# Patient Record
Sex: Female | Born: 1965 | Race: Black or African American | Hispanic: No | Smoking: Never smoker
Health system: Southern US, Community
[De-identification: ages and names within clinical notes are randomized; demographics above are authoritative.]

## PROBLEM LIST (undated history)

## (undated) DIAGNOSIS — A879 Viral meningitis, unspecified: Secondary | ICD-10-CM

## (undated) DIAGNOSIS — R918 Other nonspecific abnormal finding of lung field: Secondary | ICD-10-CM

## (undated) DIAGNOSIS — J40 Bronchitis, not specified as acute or chronic: Secondary | ICD-10-CM

## (undated) DIAGNOSIS — L309 Dermatitis, unspecified: Secondary | ICD-10-CM

## (undated) DIAGNOSIS — G4733 Obstructive sleep apnea (adult) (pediatric): Secondary | ICD-10-CM

## (undated) DIAGNOSIS — K279 Peptic ulcer, site unspecified, unspecified as acute or chronic, without hemorrhage or perforation: Secondary | ICD-10-CM

## (undated) DIAGNOSIS — K8020 Calculus of gallbladder without cholecystitis without obstruction: Secondary | ICD-10-CM

## (undated) DIAGNOSIS — G8929 Other chronic pain: Secondary | ICD-10-CM

## (undated) DIAGNOSIS — F329 Major depressive disorder, single episode, unspecified: Secondary | ICD-10-CM

## (undated) DIAGNOSIS — E119 Type 2 diabetes mellitus without complications: Secondary | ICD-10-CM

## (undated) DIAGNOSIS — F419 Anxiety disorder, unspecified: Secondary | ICD-10-CM

## (undated) DIAGNOSIS — M6530 Trigger finger, unspecified finger: Secondary | ICD-10-CM

## (undated) DIAGNOSIS — M549 Dorsalgia, unspecified: Secondary | ICD-10-CM

## (undated) DIAGNOSIS — R0602 Shortness of breath: Secondary | ICD-10-CM

## (undated) DIAGNOSIS — R3915 Urgency of urination: Secondary | ICD-10-CM

## (undated) DIAGNOSIS — M2540 Effusion, unspecified joint: Secondary | ICD-10-CM

## (undated) DIAGNOSIS — M069 Rheumatoid arthritis, unspecified: Secondary | ICD-10-CM

## (undated) DIAGNOSIS — R42 Dizziness and giddiness: Secondary | ICD-10-CM

## (undated) DIAGNOSIS — M2550 Pain in unspecified joint: Secondary | ICD-10-CM

## (undated) DIAGNOSIS — R350 Frequency of micturition: Secondary | ICD-10-CM

## (undated) DIAGNOSIS — J189 Pneumonia, unspecified organism: Secondary | ICD-10-CM

## (undated) DIAGNOSIS — M1990 Unspecified osteoarthritis, unspecified site: Secondary | ICD-10-CM

## (undated) DIAGNOSIS — F32A Depression, unspecified: Secondary | ICD-10-CM

## (undated) DIAGNOSIS — R238 Other skin changes: Secondary | ICD-10-CM

## (undated) DIAGNOSIS — I10 Essential (primary) hypertension: Secondary | ICD-10-CM

## (undated) DIAGNOSIS — E662 Morbid (severe) obesity with alveolar hypoventilation: Secondary | ICD-10-CM

## (undated) DIAGNOSIS — N3289 Other specified disorders of bladder: Secondary | ICD-10-CM

## (undated) DIAGNOSIS — N739 Female pelvic inflammatory disease, unspecified: Secondary | ICD-10-CM

## (undated) DIAGNOSIS — G4700 Insomnia, unspecified: Secondary | ICD-10-CM

## (undated) DIAGNOSIS — R351 Nocturia: Secondary | ICD-10-CM

## (undated) DIAGNOSIS — G5600 Carpal tunnel syndrome, unspecified upper limb: Secondary | ICD-10-CM

## (undated) DIAGNOSIS — R51 Headache: Secondary | ICD-10-CM

## (undated) DIAGNOSIS — K219 Gastro-esophageal reflux disease without esophagitis: Secondary | ICD-10-CM

## (undated) DIAGNOSIS — J984 Other disorders of lung: Secondary | ICD-10-CM

## (undated) HISTORY — DX: Gastro-esophageal reflux disease without esophagitis: K21.9

## (undated) HISTORY — DX: Other nonspecific abnormal finding of lung field: R91.8

## (undated) HISTORY — DX: Obstructive sleep apnea (adult) (pediatric): G47.33

## (undated) HISTORY — DX: Type 2 diabetes mellitus without complications: E11.9

## (undated) HISTORY — PX: ESOPHAGOGASTRODUODENOSCOPY: SHX1529

## (undated) HISTORY — DX: Other specified disorders of bladder: N32.89

## (undated) HISTORY — DX: Female pelvic inflammatory disease, unspecified: N73.9

## (undated) HISTORY — PX: OTHER SURGICAL HISTORY: SHX169

## (undated) HISTORY — DX: Major depressive disorder, single episode, unspecified: F32.9

## (undated) HISTORY — DX: Viral meningitis, unspecified: A87.9

## (undated) HISTORY — DX: Depression, unspecified: F32.A

## (undated) HISTORY — DX: Morbid (severe) obesity with alveolar hypoventilation: E66.2

## (undated) HISTORY — DX: Calculus of gallbladder without cholecystitis without obstruction: K80.20

---
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
## 1993-08-25 HISTORY — PX: OTHER SURGICAL HISTORY: SHX169

## 1997-08-05 ENCOUNTER — Encounter: Admission: RE | Admit: 1997-08-05 | Discharge: 1997-08-05 | Payer: Self-pay | Admitting: Internal Medicine

## 1997-08-30 ENCOUNTER — Encounter: Admission: RE | Admit: 1997-08-30 | Discharge: 1997-08-30 | Payer: Self-pay | Admitting: Internal Medicine

## 1997-10-08 ENCOUNTER — Encounter: Admission: RE | Admit: 1997-10-08 | Discharge: 1997-10-08 | Payer: Self-pay | Admitting: Internal Medicine

## 1997-10-29 ENCOUNTER — Encounter: Admission: RE | Admit: 1997-10-29 | Discharge: 1998-01-27 | Payer: Self-pay | Admitting: Internal Medicine

## 1997-11-14 ENCOUNTER — Ambulatory Visit (HOSPITAL_COMMUNITY): Admission: RE | Admit: 1997-11-14 | Discharge: 1997-11-14 | Payer: Self-pay | Admitting: Internal Medicine

## 1997-11-22 ENCOUNTER — Encounter: Admission: RE | Admit: 1997-11-22 | Discharge: 1997-11-22 | Payer: Self-pay | Admitting: Internal Medicine

## 1998-01-03 ENCOUNTER — Encounter: Admission: RE | Admit: 1998-01-03 | Discharge: 1998-01-03 | Payer: Self-pay | Admitting: Internal Medicine

## 1998-01-22 ENCOUNTER — Encounter: Admission: RE | Admit: 1998-01-22 | Discharge: 1998-01-22 | Payer: Self-pay | Admitting: Internal Medicine

## 1998-02-05 ENCOUNTER — Encounter: Admission: RE | Admit: 1998-02-05 | Discharge: 1998-02-05 | Payer: Self-pay | Admitting: Internal Medicine

## 1998-03-07 ENCOUNTER — Encounter: Admission: RE | Admit: 1998-03-07 | Discharge: 1998-06-05 | Payer: Self-pay | Admitting: Internal Medicine

## 1998-03-21 ENCOUNTER — Encounter: Admission: RE | Admit: 1998-03-21 | Discharge: 1998-03-21 | Payer: Self-pay | Admitting: Internal Medicine

## 1998-05-05 ENCOUNTER — Encounter: Admission: RE | Admit: 1998-05-05 | Discharge: 1998-05-05 | Payer: Self-pay | Admitting: Internal Medicine

## 1998-07-17 ENCOUNTER — Encounter: Admission: RE | Admit: 1998-07-17 | Discharge: 1998-07-17 | Payer: Self-pay | Admitting: Hematology and Oncology

## 1998-07-17 ENCOUNTER — Encounter: Payer: Self-pay | Admitting: Hematology and Oncology

## 1998-07-17 ENCOUNTER — Ambulatory Visit: Admission: RE | Admit: 1998-07-17 | Discharge: 1998-07-17 | Payer: Self-pay | Admitting: Hematology and Oncology

## 1998-10-17 ENCOUNTER — Encounter: Admission: RE | Admit: 1998-10-17 | Discharge: 1998-10-17 | Payer: Self-pay | Admitting: Hematology and Oncology

## 1999-02-20 ENCOUNTER — Encounter: Admission: RE | Admit: 1999-02-20 | Discharge: 1999-02-20 | Payer: Self-pay | Admitting: Internal Medicine

## 1999-02-26 ENCOUNTER — Encounter: Admission: RE | Admit: 1999-02-26 | Discharge: 1999-05-27 | Payer: Self-pay | Admitting: Infectious Diseases

## 1999-04-03 ENCOUNTER — Encounter: Admission: RE | Admit: 1999-04-03 | Discharge: 1999-04-03 | Payer: Self-pay | Admitting: Internal Medicine

## 1999-04-10 ENCOUNTER — Encounter: Admission: RE | Admit: 1999-04-10 | Discharge: 1999-04-10 | Payer: Self-pay | Admitting: Internal Medicine

## 1999-05-15 ENCOUNTER — Encounter: Admission: RE | Admit: 1999-05-15 | Discharge: 1999-05-15 | Payer: Self-pay | Admitting: Internal Medicine

## 1999-05-18 ENCOUNTER — Encounter: Payer: Self-pay | Admitting: Internal Medicine

## 1999-05-18 ENCOUNTER — Encounter: Admission: RE | Admit: 1999-05-18 | Discharge: 1999-05-18 | Payer: Self-pay | Admitting: Internal Medicine

## 1999-09-09 ENCOUNTER — Encounter: Admission: RE | Admit: 1999-09-09 | Discharge: 1999-09-09 | Payer: Self-pay | Admitting: Family Medicine

## 1999-09-24 ENCOUNTER — Other Ambulatory Visit: Admission: RE | Admit: 1999-09-24 | Discharge: 1999-09-24 | Payer: Self-pay | Admitting: Family Medicine

## 1999-09-24 ENCOUNTER — Other Ambulatory Visit: Admission: RE | Admit: 1999-09-24 | Discharge: 1999-09-24 | Payer: Self-pay | Admitting: *Deleted

## 1999-09-24 ENCOUNTER — Encounter: Admission: RE | Admit: 1999-09-24 | Discharge: 1999-09-24 | Payer: Self-pay | Admitting: Family Medicine

## 1999-10-23 ENCOUNTER — Ambulatory Visit (HOSPITAL_COMMUNITY): Admission: RE | Admit: 1999-10-23 | Discharge: 1999-10-23 | Payer: Self-pay | Admitting: Family Medicine

## 1999-10-23 ENCOUNTER — Encounter: Admission: RE | Admit: 1999-10-23 | Discharge: 1999-10-23 | Payer: Self-pay | Admitting: Family Medicine

## 1999-10-29 ENCOUNTER — Encounter: Admission: RE | Admit: 1999-10-29 | Discharge: 1999-10-29 | Payer: Self-pay | Admitting: Family Medicine

## 1999-11-11 ENCOUNTER — Ambulatory Visit (HOSPITAL_COMMUNITY): Admission: RE | Admit: 1999-11-11 | Discharge: 1999-11-11 | Payer: Self-pay

## 1999-11-12 ENCOUNTER — Encounter: Admission: RE | Admit: 1999-11-12 | Discharge: 1999-11-12 | Payer: Self-pay | Admitting: Family Medicine

## 1999-11-19 ENCOUNTER — Encounter: Admission: RE | Admit: 1999-11-19 | Discharge: 1999-11-19 | Payer: Self-pay | Admitting: Family Medicine

## 1999-12-14 ENCOUNTER — Encounter: Admission: RE | Admit: 1999-12-14 | Discharge: 1999-12-14 | Payer: Self-pay | Admitting: Sports Medicine

## 1999-12-21 ENCOUNTER — Encounter: Admission: RE | Admit: 1999-12-21 | Discharge: 1999-12-21 | Payer: Self-pay | Admitting: Family Medicine

## 1999-12-25 ENCOUNTER — Encounter: Admission: RE | Admit: 1999-12-25 | Discharge: 1999-12-25 | Payer: Self-pay | Admitting: Family Medicine

## 2000-01-13 ENCOUNTER — Encounter: Admission: RE | Admit: 2000-01-13 | Discharge: 2000-01-13 | Payer: Self-pay | Admitting: Family Medicine

## 2000-02-02 ENCOUNTER — Encounter: Admission: RE | Admit: 2000-02-02 | Discharge: 2000-02-02 | Payer: Self-pay | Admitting: Family Medicine

## 2000-02-15 ENCOUNTER — Encounter: Admission: RE | Admit: 2000-02-15 | Discharge: 2000-02-15 | Payer: Self-pay | Admitting: Family Medicine

## 2000-02-29 ENCOUNTER — Encounter: Admission: RE | Admit: 2000-02-29 | Discharge: 2000-02-29 | Payer: Self-pay | Admitting: Family Medicine

## 2000-03-14 ENCOUNTER — Encounter: Admission: RE | Admit: 2000-03-14 | Discharge: 2000-03-14 | Payer: Self-pay | Admitting: Family Medicine

## 2000-03-21 ENCOUNTER — Ambulatory Visit (HOSPITAL_COMMUNITY): Admission: RE | Admit: 2000-03-21 | Discharge: 2000-03-21 | Payer: Self-pay | Admitting: *Deleted

## 2000-03-25 ENCOUNTER — Encounter: Admission: RE | Admit: 2000-03-25 | Discharge: 2000-03-25 | Payer: Self-pay | Admitting: Family Medicine

## 2000-03-28 ENCOUNTER — Encounter: Admission: RE | Admit: 2000-03-28 | Discharge: 2000-03-28 | Payer: Self-pay | Admitting: Family Medicine

## 2000-04-04 ENCOUNTER — Encounter: Admission: RE | Admit: 2000-04-04 | Discharge: 2000-04-04 | Payer: Self-pay | Admitting: Family Medicine

## 2000-04-12 ENCOUNTER — Encounter: Admission: RE | Admit: 2000-04-12 | Discharge: 2000-04-12 | Payer: Self-pay | Admitting: Family Medicine

## 2000-04-13 ENCOUNTER — Encounter: Payer: Self-pay | Admitting: Obstetrics

## 2000-04-13 ENCOUNTER — Encounter (HOSPITAL_COMMUNITY): Admission: RE | Admit: 2000-04-13 | Discharge: 2000-04-18 | Payer: Self-pay | Admitting: Obstetrics

## 2000-04-15 ENCOUNTER — Encounter: Admission: RE | Admit: 2000-04-15 | Discharge: 2000-04-15 | Payer: Self-pay | Admitting: Family Medicine

## 2000-04-15 ENCOUNTER — Inpatient Hospital Stay (HOSPITAL_COMMUNITY): Admission: AD | Admit: 2000-04-15 | Discharge: 2000-04-18 | Payer: Self-pay | Admitting: *Deleted

## 2000-04-22 ENCOUNTER — Inpatient Hospital Stay (HOSPITAL_COMMUNITY): Admission: AD | Admit: 2000-04-22 | Discharge: 2000-04-26 | Payer: Self-pay | Admitting: Obstetrics & Gynecology

## 2000-04-23 ENCOUNTER — Encounter: Payer: Self-pay | Admitting: Obstetrics

## 2000-04-24 ENCOUNTER — Encounter: Payer: Self-pay | Admitting: Obstetrics & Gynecology

## 2000-05-12 ENCOUNTER — Encounter: Admission: RE | Admit: 2000-05-12 | Discharge: 2000-06-11 | Payer: Self-pay | Admitting: *Deleted

## 2000-05-24 ENCOUNTER — Encounter: Admission: RE | Admit: 2000-05-24 | Discharge: 2000-05-24 | Payer: Self-pay | Admitting: Family Medicine

## 2000-05-24 ENCOUNTER — Other Ambulatory Visit: Admission: RE | Admit: 2000-05-24 | Discharge: 2000-05-24 | Payer: Self-pay | Admitting: *Deleted

## 2000-06-10 ENCOUNTER — Encounter: Admission: RE | Admit: 2000-06-10 | Discharge: 2000-06-10 | Payer: Self-pay | Admitting: Internal Medicine

## 2000-08-09 ENCOUNTER — Encounter: Admission: RE | Admit: 2000-08-09 | Discharge: 2000-08-09 | Payer: Self-pay | Admitting: Internal Medicine

## 2000-08-09 ENCOUNTER — Encounter: Payer: Self-pay | Admitting: Internal Medicine

## 2000-08-09 ENCOUNTER — Ambulatory Visit (HOSPITAL_COMMUNITY): Admission: RE | Admit: 2000-08-09 | Discharge: 2000-08-09 | Payer: Self-pay | Admitting: Internal Medicine

## 2000-08-23 ENCOUNTER — Encounter: Admission: RE | Admit: 2000-08-23 | Discharge: 2000-08-23 | Payer: Self-pay | Admitting: Internal Medicine

## 2000-10-06 ENCOUNTER — Encounter: Admission: RE | Admit: 2000-10-06 | Discharge: 2000-10-06 | Payer: Self-pay | Admitting: Family Medicine

## 2000-10-14 ENCOUNTER — Encounter: Admission: RE | Admit: 2000-10-14 | Discharge: 2000-10-14 | Payer: Self-pay | Admitting: Obstetrics & Gynecology

## 2000-11-03 ENCOUNTER — Encounter: Admission: RE | Admit: 2000-11-03 | Discharge: 2000-11-03 | Payer: Self-pay

## 2001-02-08 ENCOUNTER — Encounter: Admission: RE | Admit: 2001-02-08 | Discharge: 2001-02-08 | Payer: Self-pay

## 2001-02-20 ENCOUNTER — Encounter: Admission: RE | Admit: 2001-02-20 | Discharge: 2001-02-20 | Payer: Self-pay

## 2001-03-28 ENCOUNTER — Encounter: Admission: RE | Admit: 2001-03-28 | Discharge: 2001-06-26 | Payer: Self-pay | Admitting: Infectious Diseases

## 2001-05-19 ENCOUNTER — Encounter: Admission: RE | Admit: 2001-05-19 | Discharge: 2001-05-19 | Payer: Self-pay | Admitting: *Deleted

## 2001-10-18 ENCOUNTER — Encounter: Admission: RE | Admit: 2001-10-18 | Discharge: 2001-10-18 | Payer: Self-pay | Admitting: Internal Medicine

## 2001-11-10 ENCOUNTER — Encounter: Admission: RE | Admit: 2001-11-10 | Discharge: 2001-11-10 | Payer: Self-pay | Admitting: Internal Medicine

## 2001-12-15 ENCOUNTER — Encounter: Admission: RE | Admit: 2001-12-15 | Discharge: 2001-12-15 | Payer: Self-pay | Admitting: Internal Medicine

## 2001-12-19 ENCOUNTER — Encounter: Admission: RE | Admit: 2001-12-19 | Discharge: 2001-12-19 | Payer: Self-pay | Admitting: Internal Medicine

## 2002-01-12 ENCOUNTER — Encounter: Admission: RE | Admit: 2002-01-12 | Discharge: 2002-01-12 | Payer: Self-pay | Admitting: Internal Medicine

## 2002-01-12 ENCOUNTER — Encounter: Admission: RE | Admit: 2002-01-12 | Discharge: 2002-01-12 | Payer: Self-pay | Admitting: *Deleted

## 2002-01-26 ENCOUNTER — Encounter: Admission: RE | Admit: 2002-01-26 | Discharge: 2002-01-26 | Payer: Self-pay | Admitting: Internal Medicine

## 2002-03-19 ENCOUNTER — Encounter: Admission: RE | Admit: 2002-03-19 | Discharge: 2002-03-19 | Payer: Self-pay | Admitting: Internal Medicine

## 2002-04-18 ENCOUNTER — Encounter: Admission: RE | Admit: 2002-04-18 | Discharge: 2002-04-18 | Payer: Self-pay | Admitting: Internal Medicine

## 2002-05-12 ENCOUNTER — Encounter: Payer: Self-pay | Admitting: Internal Medicine

## 2002-05-12 ENCOUNTER — Encounter: Admission: RE | Admit: 2002-05-12 | Discharge: 2002-05-12 | Payer: Self-pay | Admitting: Internal Medicine

## 2002-05-14 ENCOUNTER — Encounter: Admission: RE | Admit: 2002-05-14 | Discharge: 2002-05-14 | Payer: Self-pay | Admitting: Internal Medicine

## 2002-06-11 ENCOUNTER — Encounter: Admission: RE | Admit: 2002-06-11 | Discharge: 2002-06-11 | Payer: Self-pay | Admitting: Internal Medicine

## 2002-06-20 ENCOUNTER — Encounter: Admission: RE | Admit: 2002-06-20 | Discharge: 2002-06-20 | Payer: Self-pay | Admitting: Internal Medicine

## 2002-09-11 ENCOUNTER — Encounter: Admission: RE | Admit: 2002-09-11 | Discharge: 2002-09-11 | Payer: Self-pay | Admitting: Internal Medicine

## 2004-03-23 ENCOUNTER — Ambulatory Visit: Payer: Self-pay | Admitting: Internal Medicine

## 2004-03-26 ENCOUNTER — Ambulatory Visit: Payer: Self-pay | Admitting: Internal Medicine

## 2004-03-27 ENCOUNTER — Ambulatory Visit (HOSPITAL_COMMUNITY): Admission: RE | Admit: 2004-03-27 | Discharge: 2004-03-27 | Payer: Self-pay | Admitting: Obstetrics & Gynecology

## 2004-03-31 ENCOUNTER — Ambulatory Visit: Payer: Self-pay | Admitting: *Deleted

## 2004-04-08 ENCOUNTER — Ambulatory Visit: Payer: Self-pay | Admitting: *Deleted

## 2004-04-16 ENCOUNTER — Ambulatory Visit: Payer: Self-pay | Admitting: Family Medicine

## 2004-04-21 ENCOUNTER — Encounter: Payer: Self-pay | Admitting: *Deleted

## 2004-04-21 ENCOUNTER — Emergency Department (HOSPITAL_COMMUNITY): Admission: EM | Admit: 2004-04-21 | Discharge: 2004-04-21 | Payer: Self-pay | Admitting: Emergency Medicine

## 2004-04-22 ENCOUNTER — Ambulatory Visit: Payer: Self-pay | Admitting: *Deleted

## 2004-04-23 ENCOUNTER — Ambulatory Visit: Payer: Self-pay | Admitting: Obstetrics & Gynecology

## 2004-05-06 ENCOUNTER — Ambulatory Visit: Payer: Self-pay | Admitting: *Deleted

## 2004-05-13 ENCOUNTER — Ambulatory Visit: Payer: Self-pay | Admitting: *Deleted

## 2004-05-13 ENCOUNTER — Ambulatory Visit (HOSPITAL_COMMUNITY): Admission: RE | Admit: 2004-05-13 | Discharge: 2004-05-13 | Payer: Self-pay | Admitting: *Deleted

## 2004-05-27 ENCOUNTER — Ambulatory Visit (HOSPITAL_COMMUNITY): Admission: RE | Admit: 2004-05-27 | Discharge: 2004-05-27 | Payer: Self-pay | Admitting: *Deleted

## 2004-05-27 ENCOUNTER — Ambulatory Visit: Payer: Self-pay | Admitting: *Deleted

## 2004-06-10 ENCOUNTER — Ambulatory Visit: Payer: Self-pay | Admitting: *Deleted

## 2004-06-24 ENCOUNTER — Ambulatory Visit: Payer: Self-pay | Admitting: Obstetrics & Gynecology

## 2004-07-08 ENCOUNTER — Ambulatory Visit: Payer: Self-pay | Admitting: Obstetrics & Gynecology

## 2004-07-15 ENCOUNTER — Ambulatory Visit: Payer: Self-pay | Admitting: *Deleted

## 2004-07-22 ENCOUNTER — Ambulatory Visit (HOSPITAL_COMMUNITY): Admission: RE | Admit: 2004-07-22 | Discharge: 2004-07-22 | Payer: Self-pay | Admitting: *Deleted

## 2004-07-22 ENCOUNTER — Ambulatory Visit: Payer: Self-pay | Admitting: Obstetrics & Gynecology

## 2004-08-05 ENCOUNTER — Ambulatory Visit: Payer: Self-pay | Admitting: *Deleted

## 2004-08-12 ENCOUNTER — Ambulatory Visit: Payer: Self-pay | Admitting: Obstetrics & Gynecology

## 2004-08-26 ENCOUNTER — Ambulatory Visit: Payer: Self-pay | Admitting: Obstetrics & Gynecology

## 2004-09-02 ENCOUNTER — Ambulatory Visit: Payer: Self-pay | Admitting: *Deleted

## 2004-09-02 ENCOUNTER — Inpatient Hospital Stay (HOSPITAL_COMMUNITY): Admission: RE | Admit: 2004-09-02 | Discharge: 2004-09-10 | Payer: Self-pay | Admitting: *Deleted

## 2004-09-07 ENCOUNTER — Encounter (INDEPENDENT_AMBULATORY_CARE_PROVIDER_SITE_OTHER): Payer: Self-pay | Admitting: *Deleted

## 2004-09-14 ENCOUNTER — Inpatient Hospital Stay (HOSPITAL_COMMUNITY): Admission: AD | Admit: 2004-09-14 | Discharge: 2004-09-14 | Payer: Self-pay | Admitting: Family Medicine

## 2004-09-18 ENCOUNTER — Inpatient Hospital Stay (HOSPITAL_COMMUNITY): Admission: AD | Admit: 2004-09-18 | Discharge: 2004-09-18 | Payer: Self-pay | Admitting: Family Medicine

## 2004-09-19 ENCOUNTER — Ambulatory Visit: Payer: Self-pay | Admitting: Family Medicine

## 2004-09-19 ENCOUNTER — Inpatient Hospital Stay (HOSPITAL_COMMUNITY): Admission: AD | Admit: 2004-09-19 | Discharge: 2004-09-20 | Payer: Self-pay | Admitting: Family Medicine

## 2004-12-14 ENCOUNTER — Ambulatory Visit (HOSPITAL_COMMUNITY): Admission: RE | Admit: 2004-12-14 | Discharge: 2004-12-14 | Payer: Self-pay | Admitting: Internal Medicine

## 2004-12-14 ENCOUNTER — Ambulatory Visit: Payer: Self-pay | Admitting: Internal Medicine

## 2005-01-13 ENCOUNTER — Ambulatory Visit: Payer: Self-pay | Admitting: Internal Medicine

## 2005-04-23 ENCOUNTER — Ambulatory Visit: Payer: Self-pay | Admitting: Family Medicine

## 2005-04-25 ENCOUNTER — Encounter (INDEPENDENT_AMBULATORY_CARE_PROVIDER_SITE_OTHER): Payer: Self-pay | Admitting: *Deleted

## 2005-04-25 LAB — CONVERTED CEMR LAB

## 2005-05-13 ENCOUNTER — Ambulatory Visit: Payer: Self-pay | Admitting: Family Medicine

## 2005-05-26 ENCOUNTER — Ambulatory Visit: Payer: Self-pay | Admitting: Family Medicine

## 2005-07-23 ENCOUNTER — Encounter: Admission: RE | Admit: 2005-07-23 | Discharge: 2005-07-23 | Payer: Self-pay | Admitting: Sports Medicine

## 2005-07-23 ENCOUNTER — Ambulatory Visit: Payer: Self-pay | Admitting: Family Medicine

## 2005-08-17 ENCOUNTER — Ambulatory Visit: Payer: Self-pay | Admitting: Family Medicine

## 2005-10-07 ENCOUNTER — Ambulatory Visit: Payer: Self-pay | Admitting: Family Medicine

## 2005-10-14 ENCOUNTER — Ambulatory Visit: Payer: Self-pay | Admitting: Family Medicine

## 2005-10-14 ENCOUNTER — Encounter: Admission: RE | Admit: 2005-10-14 | Discharge: 2005-10-14 | Payer: Self-pay | Admitting: Family Medicine

## 2005-11-08 ENCOUNTER — Ambulatory Visit: Payer: Self-pay | Admitting: Sports Medicine

## 2005-11-11 ENCOUNTER — Ambulatory Visit: Payer: Self-pay | Admitting: Sports Medicine

## 2006-02-07 ENCOUNTER — Ambulatory Visit: Payer: Self-pay | Admitting: Sports Medicine

## 2006-03-14 ENCOUNTER — Ambulatory Visit: Payer: Self-pay | Admitting: Family Medicine

## 2006-03-21 ENCOUNTER — Emergency Department (HOSPITAL_COMMUNITY): Admission: EM | Admit: 2006-03-21 | Discharge: 2006-03-22 | Payer: Self-pay | Admitting: *Deleted

## 2006-03-24 DIAGNOSIS — I10 Essential (primary) hypertension: Secondary | ICD-10-CM | POA: Insufficient documentation

## 2006-03-24 DIAGNOSIS — F329 Major depressive disorder, single episode, unspecified: Secondary | ICD-10-CM

## 2006-03-24 DIAGNOSIS — K219 Gastro-esophageal reflux disease without esophagitis: Secondary | ICD-10-CM | POA: Insufficient documentation

## 2006-03-24 DIAGNOSIS — F339 Major depressive disorder, recurrent, unspecified: Secondary | ICD-10-CM | POA: Insufficient documentation

## 2006-03-25 ENCOUNTER — Encounter (INDEPENDENT_AMBULATORY_CARE_PROVIDER_SITE_OTHER): Payer: Self-pay | Admitting: *Deleted

## 2006-04-28 ENCOUNTER — Ambulatory Visit: Payer: Self-pay | Admitting: Family Medicine

## 2006-05-30 ENCOUNTER — Ambulatory Visit: Payer: Self-pay | Admitting: Family Medicine

## 2006-05-30 DIAGNOSIS — N3946 Mixed incontinence: Secondary | ICD-10-CM | POA: Insufficient documentation

## 2006-05-30 LAB — CONVERTED CEMR LAB
Glucose, Urine, Semiquant: NEGATIVE
Nitrite: NEGATIVE
Specific Gravity, Urine: 1.02
WBC Urine, dipstick: NEGATIVE
pH: 6

## 2006-06-02 ENCOUNTER — Ambulatory Visit: Payer: Self-pay | Admitting: Family Medicine

## 2006-06-06 ENCOUNTER — Ambulatory Visit: Payer: Self-pay | Admitting: Sports Medicine

## 2006-06-06 LAB — CONVERTED CEMR LAB: Beta hcg, urine, semiquantitative: NEGATIVE

## 2006-07-04 ENCOUNTER — Ambulatory Visit: Payer: Self-pay | Admitting: Sports Medicine

## 2006-07-11 ENCOUNTER — Telehealth: Payer: Self-pay | Admitting: *Deleted

## 2006-07-12 ENCOUNTER — Ambulatory Visit: Payer: Self-pay | Admitting: Family Medicine

## 2006-08-01 ENCOUNTER — Ambulatory Visit: Payer: Self-pay | Admitting: Family Medicine

## 2006-08-04 ENCOUNTER — Telehealth: Payer: Self-pay | Admitting: *Deleted

## 2006-08-23 ENCOUNTER — Encounter: Admission: RE | Admit: 2006-08-23 | Discharge: 2006-10-18 | Payer: Self-pay | Admitting: Family Medicine

## 2006-09-05 ENCOUNTER — Ambulatory Visit: Payer: Self-pay | Admitting: Family Medicine

## 2006-11-01 ENCOUNTER — Ambulatory Visit: Payer: Self-pay | Admitting: Family Medicine

## 2006-11-08 ENCOUNTER — Encounter (INDEPENDENT_AMBULATORY_CARE_PROVIDER_SITE_OTHER): Payer: Self-pay | Admitting: Family Medicine

## 2006-12-19 ENCOUNTER — Ambulatory Visit (HOSPITAL_COMMUNITY): Admission: RE | Admit: 2006-12-19 | Discharge: 2006-12-19 | Payer: Self-pay | Admitting: Family Medicine

## 2006-12-19 ENCOUNTER — Encounter (INDEPENDENT_AMBULATORY_CARE_PROVIDER_SITE_OTHER): Payer: Self-pay | Admitting: Family Medicine

## 2006-12-19 ENCOUNTER — Ambulatory Visit: Payer: Self-pay | Admitting: Family Medicine

## 2006-12-19 ENCOUNTER — Encounter: Admission: RE | Admit: 2006-12-19 | Discharge: 2006-12-19 | Payer: Self-pay | Admitting: Sports Medicine

## 2006-12-19 LAB — CONVERTED CEMR LAB
Basophils Absolute: 0 10*3/uL (ref 0.0–0.1)
Basophils Relative: 0 % (ref 0–1)
Chloride: 106 meq/L (ref 96–112)
Creatinine, Ser: 0.74 mg/dL (ref 0.40–1.20)
HCT: 38.8 % (ref 36.0–46.0)
Lymphocytes Relative: 19 % (ref 12–46)
Lymphs Abs: 1.8 10*3/uL (ref 0.7–4.0)
MCV: 84 fL (ref 78.0–100.0)
Monocytes Absolute: 0.7 10*3/uL (ref 0.1–1.0)
Monocytes Relative: 7 % (ref 3–12)
Platelets: 303 10*3/uL (ref 150–400)
RBC: 4.62 M/uL (ref 3.87–5.11)
WBC: 9.8 10*3/uL (ref 4.0–10.5)

## 2006-12-20 ENCOUNTER — Telehealth (INDEPENDENT_AMBULATORY_CARE_PROVIDER_SITE_OTHER): Payer: Self-pay | Admitting: Family Medicine

## 2006-12-27 ENCOUNTER — Encounter (INDEPENDENT_AMBULATORY_CARE_PROVIDER_SITE_OTHER): Payer: Self-pay | Admitting: Family Medicine

## 2007-02-09 ENCOUNTER — Ambulatory Visit: Payer: Self-pay | Admitting: Family Medicine

## 2007-02-09 LAB — CONVERTED CEMR LAB: Beta hcg, urine, semiquantitative: NEGATIVE

## 2007-02-10 ENCOUNTER — Encounter: Admission: RE | Admit: 2007-02-10 | Discharge: 2007-02-10 | Payer: Self-pay | Admitting: *Deleted

## 2007-02-23 ENCOUNTER — Ambulatory Visit: Payer: Self-pay | Admitting: *Deleted

## 2007-02-23 LAB — CONVERTED CEMR LAB: Beta hcg, urine, semiquantitative: NEGATIVE

## 2007-02-28 ENCOUNTER — Encounter (INDEPENDENT_AMBULATORY_CARE_PROVIDER_SITE_OTHER): Payer: Self-pay | Admitting: Family Medicine

## 2007-02-28 ENCOUNTER — Ambulatory Visit: Payer: Self-pay | Admitting: Family Medicine

## 2007-03-07 ENCOUNTER — Ambulatory Visit: Payer: Self-pay | Admitting: Family Medicine

## 2007-03-07 DIAGNOSIS — G733 Myasthenic syndromes in other diseases classified elsewhere: Secondary | ICD-10-CM | POA: Insufficient documentation

## 2007-04-10 ENCOUNTER — Ambulatory Visit: Payer: Self-pay | Admitting: Sports Medicine

## 2007-04-10 DIAGNOSIS — Z6841 Body Mass Index (BMI) 40.0 and over, adult: Secondary | ICD-10-CM | POA: Insufficient documentation

## 2007-04-10 HISTORY — DX: Morbid (severe) obesity due to excess calories: E66.01

## 2007-04-14 ENCOUNTER — Telehealth (INDEPENDENT_AMBULATORY_CARE_PROVIDER_SITE_OTHER): Payer: Self-pay | Admitting: Family Medicine

## 2007-05-22 ENCOUNTER — Telehealth (INDEPENDENT_AMBULATORY_CARE_PROVIDER_SITE_OTHER): Payer: Self-pay | Admitting: Family Medicine

## 2007-05-25 ENCOUNTER — Ambulatory Visit: Payer: Self-pay | Admitting: Family Medicine

## 2007-05-25 ENCOUNTER — Telehealth: Payer: Self-pay | Admitting: *Deleted

## 2007-05-29 ENCOUNTER — Encounter: Payer: Self-pay | Admitting: *Deleted

## 2007-06-06 ENCOUNTER — Ambulatory Visit: Payer: Self-pay | Admitting: Family Medicine

## 2007-06-26 ENCOUNTER — Ambulatory Visit: Payer: Self-pay | Admitting: Family Medicine

## 2007-07-07 ENCOUNTER — Ambulatory Visit: Payer: Self-pay | Admitting: Family Medicine

## 2007-07-13 ENCOUNTER — Encounter (INDEPENDENT_AMBULATORY_CARE_PROVIDER_SITE_OTHER): Payer: Self-pay | Admitting: *Deleted

## 2007-07-13 ENCOUNTER — Ambulatory Visit: Payer: Self-pay | Admitting: Family Medicine

## 2007-07-13 ENCOUNTER — Encounter (INDEPENDENT_AMBULATORY_CARE_PROVIDER_SITE_OTHER): Payer: Self-pay | Admitting: Family Medicine

## 2007-08-23 ENCOUNTER — Ambulatory Visit: Payer: Self-pay | Admitting: Family Medicine

## 2007-08-23 ENCOUNTER — Encounter (INDEPENDENT_AMBULATORY_CARE_PROVIDER_SITE_OTHER): Payer: Self-pay | Admitting: Family Medicine

## 2007-08-26 HISTORY — PX: BRONCHOSCOPY: SUR163

## 2007-09-26 ENCOUNTER — Encounter: Admission: RE | Admit: 2007-09-26 | Discharge: 2007-09-26 | Payer: Self-pay | Admitting: Internal Medicine

## 2007-09-26 ENCOUNTER — Ambulatory Visit: Payer: Self-pay | Admitting: Sports Medicine

## 2007-09-26 ENCOUNTER — Telehealth (INDEPENDENT_AMBULATORY_CARE_PROVIDER_SITE_OTHER): Payer: Self-pay | Admitting: *Deleted

## 2007-09-27 ENCOUNTER — Encounter (INDEPENDENT_AMBULATORY_CARE_PROVIDER_SITE_OTHER): Payer: Self-pay | Admitting: *Deleted

## 2007-09-27 ENCOUNTER — Encounter (INDEPENDENT_AMBULATORY_CARE_PROVIDER_SITE_OTHER): Payer: Self-pay | Admitting: Family Medicine

## 2007-09-27 LAB — CONVERTED CEMR LAB
Glucose, Bld: 106 mg/dL — ABNORMAL HIGH (ref 70–99)
MCHC: 32.5 g/dL (ref 30.0–36.0)
Platelets: 325 10*3/uL (ref 150–400)
Potassium: 3.3 meq/L — ABNORMAL LOW (ref 3.5–5.3)
Pro B Natriuretic peptide (BNP): 28 pg/mL (ref 0.0–100.0)
RBC: 4.58 M/uL (ref 3.87–5.11)
RDW: 15 % (ref 11.5–15.5)
Sodium: 142 meq/L (ref 135–145)
TSH: 1.192 microintl units/mL (ref 0.350–4.50)

## 2007-09-28 ENCOUNTER — Ambulatory Visit: Payer: Self-pay | Admitting: Family Medicine

## 2007-09-28 ENCOUNTER — Inpatient Hospital Stay (HOSPITAL_COMMUNITY): Admission: AD | Admit: 2007-09-28 | Discharge: 2007-10-03 | Payer: Self-pay | Admitting: Family Medicine

## 2007-09-28 ENCOUNTER — Ambulatory Visit: Payer: Self-pay | Admitting: Pulmonary Disease

## 2007-09-29 ENCOUNTER — Encounter (INDEPENDENT_AMBULATORY_CARE_PROVIDER_SITE_OTHER): Payer: Self-pay | Admitting: Pulmonary Disease

## 2007-09-29 ENCOUNTER — Encounter: Payer: Self-pay | Admitting: Family Medicine

## 2007-09-29 ENCOUNTER — Ambulatory Visit: Payer: Self-pay | Admitting: Cardiovascular Disease

## 2007-09-29 ENCOUNTER — Encounter (INDEPENDENT_AMBULATORY_CARE_PROVIDER_SITE_OTHER): Payer: Self-pay | Admitting: Family Medicine

## 2007-10-05 ENCOUNTER — Encounter (INDEPENDENT_AMBULATORY_CARE_PROVIDER_SITE_OTHER): Payer: Self-pay | Admitting: Family Medicine

## 2007-10-05 ENCOUNTER — Encounter: Payer: Self-pay | Admitting: *Deleted

## 2007-10-06 ENCOUNTER — Ambulatory Visit: Payer: Self-pay | Admitting: Family Medicine

## 2007-10-25 ENCOUNTER — Ambulatory Visit: Payer: Self-pay | Admitting: Family Medicine

## 2007-10-25 ENCOUNTER — Encounter (INDEPENDENT_AMBULATORY_CARE_PROVIDER_SITE_OTHER): Payer: Self-pay | Admitting: Family Medicine

## 2007-10-25 LAB — CONVERTED CEMR LAB
Chloride: 99 meq/L (ref 96–112)
Potassium: 4.1 meq/L (ref 3.5–5.3)
Sodium: 138 meq/L (ref 135–145)

## 2007-10-30 ENCOUNTER — Ambulatory Visit: Payer: Self-pay | Admitting: Pulmonary Disease

## 2007-10-30 DIAGNOSIS — G4733 Obstructive sleep apnea (adult) (pediatric): Secondary | ICD-10-CM | POA: Insufficient documentation

## 2007-10-31 ENCOUNTER — Encounter: Payer: Self-pay | Admitting: *Deleted

## 2007-11-01 ENCOUNTER — Encounter: Payer: Self-pay | Admitting: Pulmonary Disease

## 2007-11-01 LAB — CONVERTED CEMR LAB
ALT: 50 units/L — ABNORMAL HIGH (ref 0–35)
Albumin: 3.2 g/dL — ABNORMAL LOW (ref 3.5–5.2)
BUN: 13 mg/dL (ref 6–23)
Basophils Absolute: 0.5 10*3/uL — ABNORMAL HIGH (ref 0.0–0.1)
Basophils Relative: 2.7 % (ref 0.0–3.0)
Calcium: 8.8 mg/dL (ref 8.4–10.5)
Creatinine, Ser: 0.8 mg/dL (ref 0.4–1.2)
Eosinophils Absolute: 0 10*3/uL (ref 0.0–0.7)
GFR calc non Af Amer: 84 mL/min
HCT: 39.9 % (ref 36.0–46.0)
Hemoglobin: 13.7 g/dL (ref 12.0–15.0)
MCHC: 34.4 g/dL (ref 30.0–36.0)
MCV: 88.7 fL (ref 78.0–100.0)
Neutro Abs: 14.8 10*3/uL — ABNORMAL HIGH (ref 1.4–7.7)
RBC: 4.49 M/uL (ref 3.87–5.11)
Rhuematoid fact SerPl-aCnc: <20 intl units/mL — ABNORMAL LOW (ref 0.0–20.0)
Sed Rate: 17 mm/hr (ref 0–22)
Total Bilirubin: 0.7 mg/dL (ref 0.3–1.2)

## 2007-11-03 LAB — CONVERTED CEMR LAB: ANA Titer 1: NEGATIVE

## 2007-11-06 ENCOUNTER — Telehealth (INDEPENDENT_AMBULATORY_CARE_PROVIDER_SITE_OTHER): Payer: Self-pay | Admitting: *Deleted

## 2007-11-08 ENCOUNTER — Encounter (INDEPENDENT_AMBULATORY_CARE_PROVIDER_SITE_OTHER): Payer: Self-pay | Admitting: Family Medicine

## 2007-11-08 ENCOUNTER — Encounter: Payer: Self-pay | Admitting: Pulmonary Disease

## 2007-11-15 ENCOUNTER — Encounter (INDEPENDENT_AMBULATORY_CARE_PROVIDER_SITE_OTHER): Payer: Self-pay | Admitting: Family Medicine

## 2007-11-15 ENCOUNTER — Other Ambulatory Visit: Admission: RE | Admit: 2007-11-15 | Discharge: 2007-11-15 | Payer: Self-pay | Admitting: Family Medicine

## 2007-11-15 ENCOUNTER — Ambulatory Visit: Payer: Self-pay | Admitting: Family Medicine

## 2007-11-17 ENCOUNTER — Encounter (INDEPENDENT_AMBULATORY_CARE_PROVIDER_SITE_OTHER): Payer: Self-pay | Admitting: Family Medicine

## 2007-11-17 LAB — CONVERTED CEMR LAB
LDL Cholesterol: 139 mg/dL — ABNORMAL HIGH (ref 0–99)
Total CHOL/HDL Ratio: 3.6
Triglycerides: 75 mg/dL (ref <150)
VLDL: 15 mg/dL (ref 0–40)

## 2007-11-18 ENCOUNTER — Encounter (INDEPENDENT_AMBULATORY_CARE_PROVIDER_SITE_OTHER): Payer: Self-pay | Admitting: Family Medicine

## 2007-11-18 ENCOUNTER — Ambulatory Visit (HOSPITAL_BASED_OUTPATIENT_CLINIC_OR_DEPARTMENT_OTHER): Admission: RE | Admit: 2007-11-18 | Discharge: 2007-11-18 | Payer: Self-pay | Admitting: Family Medicine

## 2007-11-24 ENCOUNTER — Encounter: Admission: RE | Admit: 2007-11-24 | Discharge: 2007-11-24 | Payer: Self-pay | Admitting: Family Medicine

## 2007-11-24 LAB — HM MAMMOGRAPHY

## 2007-11-25 ENCOUNTER — Ambulatory Visit: Payer: Self-pay | Admitting: Internal Medicine

## 2007-11-30 ENCOUNTER — Ambulatory Visit: Payer: Self-pay | Admitting: Pulmonary Disease

## 2007-12-01 ENCOUNTER — Ambulatory Visit: Payer: Self-pay | Admitting: Pulmonary Disease

## 2007-12-01 DIAGNOSIS — J8410 Pulmonary fibrosis, unspecified: Secondary | ICD-10-CM | POA: Insufficient documentation

## 2007-12-01 DIAGNOSIS — J45909 Unspecified asthma, uncomplicated: Secondary | ICD-10-CM | POA: Insufficient documentation

## 2007-12-04 ENCOUNTER — Telehealth (INDEPENDENT_AMBULATORY_CARE_PROVIDER_SITE_OTHER): Payer: Self-pay | Admitting: *Deleted

## 2007-12-08 ENCOUNTER — Encounter: Payer: Self-pay | Admitting: Pulmonary Disease

## 2007-12-12 ENCOUNTER — Encounter: Payer: Self-pay | Admitting: Pulmonary Disease

## 2007-12-27 ENCOUNTER — Ambulatory Visit: Payer: Self-pay | Admitting: Pulmonary Disease

## 2007-12-27 DIAGNOSIS — E678 Other specified hyperalimentation: Secondary | ICD-10-CM | POA: Insufficient documentation

## 2007-12-28 ENCOUNTER — Encounter: Payer: Self-pay | Admitting: Pulmonary Disease

## 2007-12-29 ENCOUNTER — Ambulatory Visit: Payer: Self-pay | Admitting: Internal Medicine

## 2008-01-09 ENCOUNTER — Ambulatory Visit: Payer: Self-pay | Admitting: Family Medicine

## 2008-01-17 ENCOUNTER — Ambulatory Visit: Payer: Self-pay

## 2008-01-17 DIAGNOSIS — M1710 Unilateral primary osteoarthritis, unspecified knee: Secondary | ICD-10-CM

## 2008-01-17 DIAGNOSIS — IMO0002 Reserved for concepts with insufficient information to code with codable children: Secondary | ICD-10-CM | POA: Insufficient documentation

## 2008-01-17 DIAGNOSIS — G5602 Carpal tunnel syndrome, left upper limb: Secondary | ICD-10-CM | POA: Insufficient documentation

## 2008-01-17 DIAGNOSIS — G5600 Carpal tunnel syndrome, unspecified upper limb: Secondary | ICD-10-CM | POA: Insufficient documentation

## 2008-01-26 DIAGNOSIS — K8020 Calculus of gallbladder without cholecystitis without obstruction: Secondary | ICD-10-CM

## 2008-01-26 HISTORY — DX: Calculus of gallbladder without cholecystitis without obstruction: K80.20

## 2008-01-30 ENCOUNTER — Ambulatory Visit: Payer: Self-pay | Admitting: Family Medicine

## 2008-01-30 ENCOUNTER — Encounter (INDEPENDENT_AMBULATORY_CARE_PROVIDER_SITE_OTHER): Payer: Self-pay | Admitting: Family Medicine

## 2008-02-16 ENCOUNTER — Encounter: Payer: Self-pay | Admitting: Pulmonary Disease

## 2008-02-27 ENCOUNTER — Encounter: Payer: Self-pay | Admitting: Pulmonary Disease

## 2008-03-08 ENCOUNTER — Encounter: Payer: Self-pay | Admitting: Pulmonary Disease

## 2008-03-19 ENCOUNTER — Encounter: Payer: Self-pay | Admitting: Pulmonary Disease

## 2008-04-02 ENCOUNTER — Encounter: Payer: Self-pay | Admitting: Pulmonary Disease

## 2008-04-03 ENCOUNTER — Ambulatory Visit: Payer: Self-pay | Admitting: Family Medicine

## 2008-04-12 ENCOUNTER — Encounter (INDEPENDENT_AMBULATORY_CARE_PROVIDER_SITE_OTHER): Payer: Self-pay | Admitting: Family Medicine

## 2008-04-12 ENCOUNTER — Ambulatory Visit: Payer: Self-pay | Admitting: Family Medicine

## 2008-04-12 LAB — CONVERTED CEMR LAB
ALT: 10 units/L (ref 0–35)
AST: 12 units/L (ref 0–37)
Alkaline Phosphatase: 85 units/L (ref 39–117)
BUN: 6 mg/dL (ref 6–23)
Basophils Absolute: 0 10*3/uL (ref 0.0–0.1)
Basophils Relative: 0 % (ref 0–1)
Calcium: 9.1 mg/dL (ref 8.4–10.5)
Creatinine, Ser: 0.75 mg/dL (ref 0.40–1.20)
Eosinophils Absolute: 0.2 10*3/uL (ref 0.0–0.7)
Eosinophils Relative: 2 % (ref 0–5)
HCT: 37.8 % (ref 36.0–46.0)
Lymphs Abs: 1.7 10*3/uL (ref 0.7–4.0)
MCHC: 33.9 g/dL (ref 30.0–36.0)
MCV: 80.9 fL (ref 78.0–100.0)
Neutrophils Relative %: 81 % — ABNORMAL HIGH (ref 43–77)
Platelets: 318 10*3/uL (ref 150–400)
RDW: 16.1 % — ABNORMAL HIGH (ref 11.5–15.5)
Total Bilirubin: 0.4 mg/dL (ref 0.3–1.2)
WBC: 13.6 10*3/uL — ABNORMAL HIGH (ref 4.0–10.5)

## 2008-05-06 ENCOUNTER — Ambulatory Visit: Payer: Self-pay | Admitting: Pulmonary Disease

## 2008-05-09 ENCOUNTER — Telehealth: Payer: Self-pay | Admitting: Pulmonary Disease

## 2008-05-27 ENCOUNTER — Encounter (INDEPENDENT_AMBULATORY_CARE_PROVIDER_SITE_OTHER): Payer: Self-pay | Admitting: Family Medicine

## 2008-05-27 ENCOUNTER — Ambulatory Visit: Payer: Self-pay | Admitting: Family Medicine

## 2008-05-28 ENCOUNTER — Encounter: Payer: Self-pay | Admitting: Pulmonary Disease

## 2008-05-30 ENCOUNTER — Ambulatory Visit: Payer: Self-pay | Admitting: Family Medicine

## 2008-05-30 DIAGNOSIS — J309 Allergic rhinitis, unspecified: Secondary | ICD-10-CM | POA: Insufficient documentation

## 2008-05-31 ENCOUNTER — Ambulatory Visit: Payer: Self-pay

## 2008-06-11 ENCOUNTER — Ambulatory Visit (HOSPITAL_COMMUNITY): Admission: RE | Admit: 2008-06-11 | Discharge: 2008-06-11 | Payer: Self-pay | Admitting: Gastroenterology

## 2008-06-18 ENCOUNTER — Encounter: Admission: RE | Admit: 2008-06-18 | Discharge: 2008-06-18 | Payer: Self-pay | Admitting: Gastroenterology

## 2008-06-25 ENCOUNTER — Ambulatory Visit: Payer: Self-pay | Admitting: Family Medicine

## 2008-06-27 ENCOUNTER — Encounter (INDEPENDENT_AMBULATORY_CARE_PROVIDER_SITE_OTHER): Payer: Self-pay | Admitting: Family Medicine

## 2008-07-25 HISTORY — PX: LAPAROSCOPIC CHOLECYSTECTOMY: SUR755

## 2008-07-30 ENCOUNTER — Encounter (INDEPENDENT_AMBULATORY_CARE_PROVIDER_SITE_OTHER): Payer: Self-pay | Admitting: Surgery

## 2008-07-30 ENCOUNTER — Ambulatory Visit (HOSPITAL_COMMUNITY): Admission: RE | Admit: 2008-07-30 | Discharge: 2008-07-31 | Payer: Self-pay | Admitting: Surgery

## 2008-09-02 ENCOUNTER — Encounter: Payer: Self-pay | Admitting: Family Medicine

## 2008-09-02 ENCOUNTER — Ambulatory Visit: Payer: Self-pay | Admitting: Family Medicine

## 2008-09-02 ENCOUNTER — Observation Stay (HOSPITAL_COMMUNITY): Admission: EM | Admit: 2008-09-02 | Discharge: 2008-09-02 | Payer: Self-pay | Admitting: Emergency Medicine

## 2008-09-04 ENCOUNTER — Ambulatory Visit: Payer: Self-pay | Admitting: Family Medicine

## 2008-09-20 ENCOUNTER — Ambulatory Visit: Payer: Self-pay | Admitting: Family Medicine

## 2008-09-26 ENCOUNTER — Ambulatory Visit: Payer: Self-pay | Admitting: Pulmonary Disease

## 2008-09-26 DIAGNOSIS — J383 Other diseases of vocal cords: Secondary | ICD-10-CM | POA: Insufficient documentation

## 2008-10-01 ENCOUNTER — Encounter: Payer: Self-pay | Admitting: Pulmonary Disease

## 2008-10-04 ENCOUNTER — Ambulatory Visit: Payer: Self-pay | Admitting: Family Medicine

## 2008-10-07 ENCOUNTER — Encounter: Payer: Self-pay | Admitting: Family Medicine

## 2008-10-07 ENCOUNTER — Telehealth: Payer: Self-pay | Admitting: Family Medicine

## 2008-10-07 DIAGNOSIS — E739 Lactose intolerance, unspecified: Secondary | ICD-10-CM | POA: Insufficient documentation

## 2008-10-10 ENCOUNTER — Encounter: Payer: Self-pay | Admitting: Pulmonary Disease

## 2008-11-01 ENCOUNTER — Ambulatory Visit: Payer: Self-pay | Admitting: Family Medicine

## 2008-11-01 ENCOUNTER — Encounter: Admission: RE | Admit: 2008-11-01 | Discharge: 2008-11-01 | Payer: Self-pay | Admitting: Family Medicine

## 2008-11-08 ENCOUNTER — Ambulatory Visit: Payer: Self-pay | Admitting: Family Medicine

## 2008-11-15 ENCOUNTER — Other Ambulatory Visit: Admission: RE | Admit: 2008-11-15 | Discharge: 2008-11-15 | Payer: Self-pay | Admitting: Family Medicine

## 2008-11-15 ENCOUNTER — Ambulatory Visit: Payer: Self-pay | Admitting: Family Medicine

## 2008-11-15 ENCOUNTER — Encounter: Payer: Self-pay | Admitting: Family Medicine

## 2008-11-15 LAB — HM PAP SMEAR

## 2008-11-20 ENCOUNTER — Encounter: Payer: Self-pay | Admitting: Family Medicine

## 2008-11-25 ENCOUNTER — Encounter: Payer: Self-pay | Admitting: Family Medicine

## 2008-11-25 ENCOUNTER — Ambulatory Visit: Payer: Self-pay | Admitting: Family Medicine

## 2008-11-25 LAB — CONVERTED CEMR LAB
Albumin: 3.8 g/dL (ref 3.5–5.2)
Alkaline Phosphatase: 97 units/L (ref 39–117)
BUN: 12 mg/dL (ref 6–23)
Calcium: 8.8 mg/dL (ref 8.4–10.5)
Chloride: 107 meq/L (ref 96–112)
Glucose, Bld: 106 mg/dL — ABNORMAL HIGH (ref 70–99)
HDL: 37 mg/dL — ABNORMAL LOW (ref >39)
LDL Cholesterol: 101 mg/dL — ABNORMAL HIGH (ref 0–99)
Potassium: 4.4 meq/L (ref 3.5–5.3)
Sodium: 143 meq/L (ref 135–145)
Total Protein: 7.1 g/dL (ref 6.0–8.3)
Triglycerides: 56 mg/dL (ref <150)

## 2008-11-26 ENCOUNTER — Encounter: Payer: Self-pay | Admitting: Family Medicine

## 2008-12-02 ENCOUNTER — Encounter: Payer: Self-pay | Admitting: Family Medicine

## 2008-12-11 ENCOUNTER — Ambulatory Visit: Payer: Self-pay | Admitting: Family Medicine

## 2008-12-13 ENCOUNTER — Encounter: Payer: Self-pay | Admitting: Pulmonary Disease

## 2009-02-26 ENCOUNTER — Emergency Department (HOSPITAL_COMMUNITY): Admission: EM | Admit: 2009-02-26 | Discharge: 2009-02-26 | Payer: Self-pay | Admitting: Emergency Medicine

## 2009-03-05 ENCOUNTER — Telehealth: Payer: Self-pay | Admitting: Family Medicine

## 2009-03-06 ENCOUNTER — Ambulatory Visit: Payer: Self-pay | Admitting: Family Medicine

## 2009-03-06 DIAGNOSIS — L0591 Pilonidal cyst without abscess: Secondary | ICD-10-CM | POA: Insufficient documentation

## 2009-03-07 ENCOUNTER — Encounter: Payer: Self-pay | Admitting: Family Medicine

## 2009-03-12 ENCOUNTER — Encounter: Payer: Self-pay | Admitting: *Deleted

## 2009-03-13 ENCOUNTER — Encounter: Payer: Self-pay | Admitting: Family Medicine

## 2009-03-25 ENCOUNTER — Encounter: Payer: Self-pay | Admitting: Family Medicine

## 2009-03-31 ENCOUNTER — Encounter: Payer: Self-pay | Admitting: Family Medicine

## 2009-03-31 ENCOUNTER — Ambulatory Visit: Payer: Self-pay | Admitting: Family Medicine

## 2009-03-31 LAB — CONVERTED CEMR LAB: Beta hcg, urine, semiquantitative: NEGATIVE

## 2009-04-02 ENCOUNTER — Ambulatory Visit: Payer: Self-pay | Admitting: Sports Medicine

## 2009-06-20 ENCOUNTER — Ambulatory Visit: Payer: Self-pay | Admitting: Family Medicine

## 2009-06-30 ENCOUNTER — Encounter: Payer: Self-pay | Admitting: Family Medicine

## 2009-08-11 ENCOUNTER — Ambulatory Visit: Payer: Self-pay | Admitting: Sports Medicine

## 2009-09-17 ENCOUNTER — Ambulatory Visit: Payer: Self-pay | Admitting: Family Medicine

## 2009-10-29 ENCOUNTER — Ambulatory Visit: Payer: Self-pay | Admitting: Family Medicine

## 2009-10-31 ENCOUNTER — Encounter: Payer: Self-pay | Admitting: Family Medicine

## 2009-11-05 ENCOUNTER — Encounter: Payer: Self-pay | Admitting: *Deleted

## 2009-11-21 ENCOUNTER — Ambulatory Visit: Payer: Self-pay | Admitting: Family Medicine

## 2009-12-02 ENCOUNTER — Encounter: Payer: Self-pay | Admitting: Family Medicine

## 2009-12-02 ENCOUNTER — Ambulatory Visit: Payer: Self-pay | Admitting: Family Medicine

## 2009-12-02 DIAGNOSIS — L259 Unspecified contact dermatitis, unspecified cause: Secondary | ICD-10-CM | POA: Insufficient documentation

## 2009-12-02 DIAGNOSIS — R17 Unspecified jaundice: Secondary | ICD-10-CM | POA: Insufficient documentation

## 2009-12-02 LAB — CONVERTED CEMR LAB
ALT: 15 units/L (ref 0–35)
AST: 16 units/L (ref 0–37)
Albumin: 4.3 g/dL (ref 3.5–5.2)
CO2: 24 meq/L (ref 19–32)
Calcium: 9.4 mg/dL (ref 8.4–10.5)
Chloride: 103 meq/L (ref 96–112)
Platelets: 270 10*3/uL (ref 150–400)
Potassium: 4.1 meq/L (ref 3.5–5.3)
RDW: 15.7 % — ABNORMAL HIGH (ref 11.5–15.5)
WBC: 8.6 10*3/uL (ref 4.0–10.5)

## 2009-12-04 ENCOUNTER — Encounter: Payer: Self-pay | Admitting: Family Medicine

## 2010-01-12 ENCOUNTER — Ambulatory Visit: Payer: Self-pay | Admitting: Family Medicine

## 2010-02-15 ENCOUNTER — Encounter: Payer: Self-pay | Admitting: Family Medicine

## 2010-02-26 NOTE — Miscellaneous (Signed)
  Clinical Lists Changes  Problems: Changed problem from ASTHMA, WITH ACUTE EXACERBATION (ICD-493.92) to ASTHMA, PERSISTENT (ICD-493.90)

## 2010-02-26 NOTE — Assessment & Plan Note (Signed)
Summary: depo,tcb  Nurse Visit   Allergies: No Known Drug Allergies  Medication Administration  Injection # 1:    Medication: Depo-Provera 150mg     Diagnosis: CONTRACEPTIVE MANAGEMENT (ICD-V25.09)    Route: IM    Site: L deltoid    Exp Date: 03/2012    Lot #: [id number]    Mfr: [email or web address]    Comments: next depo due Nov 9 thru Nov 23    Patient tolerated injection without complications    Given by: Theresia Lo RN (September 17, 2009 5:36 PM)  Orders Added: 1)  Depo-Provera 150mg  [J1055] 2)  Admin of Injection (IM/SQ) [81191]   Medication Administration  Injection # 1:    Medication: Depo-Provera 150mg     Diagnosis: CONTRACEPTIVE MANAGEMENT (ICD-V25.09)    Route: IM    Site: L deltoid    Exp Date: 03/2012    Lot #: [id number]    Mfr: [email or web address]    Comments: next depo due Nov 9 thru Nov 23    Patient tolerated injection without complications    Given by: Theresia Lo RN (September 17, 2009 5:36 PM)  Orders Added: 1)  Depo-Provera 150mg  [J1055] 2)  Admin of Injection (IM/SQ) [62130]

## 2010-02-26 NOTE — Miscellaneous (Signed)
Summary: Removing old acute medications.  Clinical Lists Changes  Medications: Removed medication of ULTRAM 50 MG TABS (TRAMADOL HCL) one tab by mouth q6 as needed for pain Removed medication of CLINDAMYCIN HCL 300 MG CAPS (CLINDAMYCIN HCL) two tabs by mouth three times a day x 10 days

## 2010-02-26 NOTE — Assessment & Plan Note (Signed)
Summary: depo,df  Nurse Visit   Allergies: No Known Drug Allergies  Medication Administration  Injection # 1:    Medication: Depo-Provera 150mg     Diagnosis: CONTRACEPTIVE MANAGEMENT (ICD-V25.09)    Route: IM    Site: L deltoid    Exp Date: 04/2011    Lot #: [id number]    Mfr: Pharmacia    Comments: next depo due August 12 thru September 19, 2009    Patient tolerated injection without complications    Given by: Theresia Lo RN (Jun 20, 2009 1:31 PM)  Orders Added: 1)  Depo-Provera 150mg  [J1055] 2)  Admin of Injection (IM/SQ) [08657]   Medication Administration  Injection # 1:    Medication: Depo-Provera 150mg     Diagnosis: CONTRACEPTIVE MANAGEMENT (ICD-V25.09)    Route: IM    Site: L deltoid    Exp Date: 04/2011    Lot #: [id number]    Mfr: Pharmacia    Comments: next depo due August 12 thru September 19, 2009    Patient tolerated injection without complications    Given by: Theresia Lo RN (Jun 20, 2009 1:31 PM)  Orders Added: 1)  Depo-Provera 150mg  [J1055] 2)  Admin of Injection (IM/SQ) [[id number]]

## 2010-02-26 NOTE — Op Note (Signed)
Summary: Consent  Consent   Imported By: Marily Memos 01/13/2010 09:35:33  _____________________________________________________________________  External Attachment:    Type:   Image     Comment:   External Document

## 2010-02-26 NOTE — Miscellaneous (Signed)
Summary: symbicort denied  Clinical Lists Changes rec'd denial from medicaid for the symbicort. form to md chart box.Golden Circle RN  March 13, 2009 8:44 AM  Prior note indicates it was approved.  Romero Belling MD  March 13, 2009 2:36 PM

## 2010-02-26 NOTE — Miscellaneous (Signed)
Summary: symbicort approved  Clinical Lists Changes apptoved x 1 yr. faxed to her pharmacy.Golden Circle RN  March 12, 2009 11:55 AM

## 2010-02-26 NOTE — Miscellaneous (Signed)
Summary: Clean up old acute medications  Clinical Lists Changes  Medications: Removed medication of VICODIN 5-500 MG TABS (HYDROCODONE-ACETAMINOPHEN) 1-2 by mouth two times a day as needed knee pain

## 2010-02-26 NOTE — Progress Notes (Signed)
Summary: triage  Phone Note Call from Patient Call back at Home Phone [phone]   Caller: Patient Summary of Call: Pt has viral infection and needed to be followed up and she is having a hard time breathing.  Also Marlise Eves 04/16/00 he has bumps under his arm that he needs to be seen for.  Can they be worked in.  They had an 8:30 this am, but the transportation did not get to her house till 8:30. Initial call taken by: Clydell Hakim,  March 05, 2009 9:21 AM  Follow-up for Phone Call        her only source of transportation did not come until 9:20. unable to get a ride back here today. 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 son has open draining sores in his axilla Follow-up by: Golden Circle RN,  March 05, 2009 10:13 AM  Additional Follow-up for Phone Call Additional follow up Details #1::        Agreed, they probably all need to go to the ED if cannot get here during business hours. Additional Follow-up by: Romero Belling MD,  March 05, 2009 10:47 AM

## 2010-02-26 NOTE — Miscellaneous (Signed)
Summary: faxed prior auth   Clinical Lists Changes faxed prior auth for singulair to medicaid.Golden Circle RN  November 05, 2009 11:12 AM

## 2010-02-26 NOTE — Miscellaneous (Signed)
Summary: Chart Summary  Clinical Lists Changes  Medications: Removed medication of PREDNISONE 20 MG TABS (PREDNISONE) take 1 pill every 12 hours two times a day for 5 days Removed medication of * HOME BLOOD PRESSURE MONITOR Use as directed.

## 2010-02-26 NOTE — Assessment & Plan Note (Signed)
Summary: POSSIBLE KNEE INJECTION/MJD   Primary Care Provider:  Romero Belling MD   History of Present Illness: B knee pain worse over last 3-4 weeks has had steroid  injections inpast and they have consistently helped nop locking or giving way no joint warmth or erythema  Allergies: No Known Drug Allergies  Physical Exam  General:  overweight-appearing.   Msk:  B knees with crepitus full extension  and flexion neurovascularly intact Additional Exam:  Patient given informed consent for injection. Discussed possible complications of infection, bleeding or skin atrophy at site of injection. Possible side effect of avascular necrosis (focal area of bone death) due to steroid use.Appropriate verbal time out taken Are cleaned and prepped in usual sterile fashion. A ----2 cc kennalog plus ---4-cc 1% lidocaine without epinephrine was injected into each knee using anterior approach---. Patient tolerated procedure well with no complications.    Impression & Recommendations:  Problem # 1:  DEGENERATIVE JOINT DISEASE, KNEES, BILATERAL (ICD-715.96)  Orders: Kenalog 10 mg inj (J3301) B knee steroid injections weight loss rtc prn  Complete Medication List: 1)  Symbicort 160-4.5 Mcg/act Aero (Budesonide-formoterol fumarate) .... Sig: 2puffs by mouth two times a day disp: 1 hfa 2)  Singulair 10 Mg Tabs (Montelukast sodium) .... One tab by mouth daily 3)  Proventil Hfa 108 (90 Base) Mcg/act Aers (Albuterol sulfate) .Marland Kitchen.. 1-2 puffs every 4hours as needed 4)  Nasonex 50 Mcg/act Susp (Mometasone furoate) .... Two puffs once daily 5)  Zyrtec Allergy 10 Mg Tabs (Cetirizine hcl) .Marland Kitchen.. 1 once daily 6)  Norvasc 10 Mg Tabs (Amlodipine besylate) .... One daily, dosage change 7)  Zestoretic 20-12.5 Mg Tabs (Lisinopril-hydrochlorothiazide) .... 2 by mouth daily 8)  Omeprazole 40 Mg Cpdr (Omeprazole) .... One by mouth two times a day 9)  Ditropan 5 Mg Tabs (Oxybutynin chloride) .... One by mouth two times  a day 10)  Cymbalta 60 Mg Cpep (Duloxetine hcl) .... Take one capsule daily 11)  Multivitamins Caps (Multiple vitamin) .... One by mouth daily 12)  Polyethylene Glycol 3350 Powd (Polyethylene glycol 3350) .Marland KitchenMarland KitchenMarland Kitchen 17 g daily 13)  Gabapentin 300 Mg Caps (Gabapentin) .Marland Kitchen.. 1 tab by mouth three times a day  Other Orders: Joint Aspirate / Injection, Large (20610)  Appended Document: POSSIBLE KNEE INJECTION/MJD    Clinical Lists Changes  Medications: Added new medication of DEPO-PROVERA 150 MG/ML SUSP (MEDROXYPROGESTERONE ACETATE) q 3 months

## 2010-02-26 NOTE — Letter (Signed)
Summary: Generic Letter  [facility]  [address]   [place], [place] [address]   Phone: [phone]  Fax: [phone]    12/04/2009  [facility] [address] [place], [place]  [address]  Dear Ms. Featherly,    I wanted to let you know that your lab work looked normal.  If you have any quesitons, please call my office.       Sincerely,   Ellery Plunk MD  Appended Document: Generic Letter mailed

## 2010-02-26 NOTE — Assessment & Plan Note (Signed)
Summary: CORT SHOT/BOTH KNEES,MC   Vital Signs:  Patient profile:   45 year old female BP sitting:   157 / 91  Vitals Entered By: Lillia Pauls CMA (April 02, 2009 10:53 AM)  Primary Provider:  Romero Belling MD   History of Present Illness: Severe bilateral knee DJD for which she receives periodic corticosteroid injections. Last set of injections in 10/2008. Pain relieved until a few weeks ago. Pain insidiously worsened in the interim. Diffuse bilateral knee pain and mild swelling. No fevers or signs of instability. Seeks corticosteroid injections. Maintains close f/u with her PCP. Intentionally lost 55 lbs.   Allergies: No Known Drug Allergies  Physical Exam  General:  Well-developed,well-nourished,in no acute distress; alert,appropriate and cooperative throughout examination Msk:  KNEES: - Trace Diffuse swelling. - No erythema or increased warmth. - Active ROM of 0 to 100 with limited 2/2 habitus. - Notable patella crepitus bilaterally. - Diffuse ttp most notable along patella margins, less ttp along medial joint line. - Normal ligament laxity.   Impression & Recommendations:  Problem # 1:  DEGENERATIVE JOINT DISEASE, KNEES, BILATERAL (ICD-715.96)  After obtaining informed verbal consent from the patient, the anterio-medial aspect of each knee was prepped with alcohol and betadine. Ethyl chloride was used to anesthetize the skin. A 4:2 mixture of 1% lidocaine and kenalog 40mg /ml was injected into each knee via an antero-medial approach without complications or difficulty.  - Continue weight loss under guidance of PCP. - RTC in 4 weeks as needed persistent pain.  Orders: Kenalog 10 mg inj (G6440) Joint Aspirate / Injection, Large (20610) Joint Aspirate / Injection, Large (20610)  Complete Medication List: 1)  Symbicort 160-4.5 Mcg/act Aero (Budesonide-formoterol fumarate) .... Sig: 2puffs by mouth two times a day disp: 1 hfa 2)  Singulair 10 Mg Tabs  (Montelukast sodium) .... One tab by mouth daily 3)  Proventil Hfa 108 (90 Base) Mcg/act Aers (Albuterol sulfate) .Marland Kitchen.. 1-2 puffs every 4hours as needed 4)  Nasonex 50 Mcg/act Susp (Mometasone furoate) .... Two puffs once daily 5)  Zyrtec Allergy 10 Mg Tabs (Cetirizine hcl) .Marland Kitchen.. 1 once daily 6)  Norvasc 10 Mg Tabs (Amlodipine besylate) .... One daily, dosage change 7)  Zestoretic 20-12.5 Mg Tabs (Lisinopril-hydrochlorothiazide) .... 2 by mouth daily 8)  Omeprazole 40 Mg Cpdr (Omeprazole) .... One by mouth two times a day 9)  Ditropan 5 Mg Tabs (Oxybutynin chloride) .... One by mouth two times a day 10)  Cymbalta 60 Mg Cpep (Duloxetine hcl) .... Take one capsule daily 11)  Multivitamins Caps (Multiple vitamin) .... One by mouth daily 12)  Polyethylene Glycol 3350 Powd (Polyethylene glycol 3350) .Marland KitchenMarland KitchenMarland Kitchen 17 g daily 13)  Gabapentin 300 Mg Caps (Gabapentin) .Marland Kitchen.. 1 tab by mouth three times a day 14)  Home Blood Pressure Monitor  .... Use as directed. 15)  Prednisone 20 Mg Tabs (Prednisone) .... Take 1 pill every 12 hours two times a day for 5 days 16)  Albuterol Sulfate (5 Mg/ml) 0.5% Nebu (Albuterol sulfate) .... Use every 4 hours as needed for sob

## 2010-02-26 NOTE — Assessment & Plan Note (Signed)
Summary: cpe/kh   Vital Signs:  Patient profile:   45 year old female Weight:      374 pounds O2 Sat:      98 % on Room air Temp:     98.9 degrees F oral Pulse rate:   108 / minute Pulse rhythm:   regular BP sitting:   179 / 120  (left arm) Cuff size:   large  Vitals Entered By: Loralee Pacas CMA (November 21, 2009 3:00 PM)  O2 Flow:  Room air  Primary Care Alvey Brockel:  Ellery Plunk MD  CC:  congestion x 1 week.  History of Present Illness: congestion and productive cough x 1 week.  using albuterol 3-4 imes a day.  no using symbicort.  taking dayquil often, which helps congestion.  no fevers.   nonsmoker btu husband smokes, persisent ashma, has not been back to pulm doctor in 2 years  obesity- gained 20lbs in last several monhs.  no change in diet bu had mult courses of prednisone in last year  Current Medications (verified): 1)  Symbicort 160-4.5 Mcg/act Aero (Budesonide-Formoterol Fumarate) .... Sig: 2puffs By Mouth Two Times A Day Disp: 1 Hfa 2)  Singulair 10 Mg Tabs (Montelukast Sodium) .... One Tab By Mouth Daily 3)  Proventil Hfa 108 (90 Base) Mcg/act  Aers (Albuterol Sulfate) .Marland Kitchen.. 1-2 Puffs Every 4hours As Needed 4)  Nasonex 50 Mcg/act Susp (Mometasone Furoate) .... Two Puffs Once Daily 5)  Zyrtec Allergy 10 Mg  Tabs (Cetirizine Hcl) .Marland Kitchen.. 1 Once Daily 6)  Norvasc 10 Mg  Tabs (Amlodipine Besylate) .... One Daily, Dosage Change 7)  Zestoretic 20-12.5 Mg Tabs (Lisinopril-Hydrochlorothiazide) .... 2 By Mouth Daily 8)  Omeprazole 40 Mg Cpdr (Omeprazole) .... One By Mouth Two Times A Day 9)  Ditropan 5 Mg  Tabs (Oxybutynin Chloride) .... One By Mouth Two Times A Day 10)  Cymbalta 60 Mg Cpep (Duloxetine Hcl) .... Take One Capsule Daily 11)  Multivitamins  Caps (Multiple Vitamin) .... One By Mouth Daily 12)  Polyethylene Glycol 3350  Powd (Polyethylene Glycol 3350) .Marland KitchenMarland KitchenMarland Kitchen 17 G Daily 13)  Gabapentin 300 Mg Caps (Gabapentin) .Marland Kitchen.. 1 Tab By Mouth Three Times A Day 14)  Depo-Provera  150 Mg/ml Susp (Medroxyprogesterone Acetate) .... Q 3 Months 15)  Prednisone 20 Mg Tabs (Prednisone) .... Take 2 Tabs For 6 Days  Allergies (verified): No Known Drug Allergies  Review of Systems       The patient complains of prolonged cough.  The patient denies anorexia, fever, and weight loss.    Physical Exam  General:  VS [id number].  speaking full sentences o2 satt 98 on room airwell-developed and morbidly obese Head:  normocephalic and atraumatic.   Ears:  R ear normal and L ear normal.   Nose:  some clear rhinorhea Mouth:  mmm no postnasla drip Lungs:  no crackle or rhonchi but expiratory wheezes throughout Heart:  Normal rate and regular rhythm. S1 and S2 normal without gallop, murmur, click, rub or other extra sounds. Abdomen:  obese, nontender   Impression & Recommendations:  Problem # 1:  ASTHMA, PERSISTENT (ICD-493.90) Assessment Deteriorated not using her inhaled steroid, now with mild exacerbation following URI.  prednisone burst x 6 days.  increased albuterol 2-3 days.  encouraged use of symbicort if able to fill, if not will give QVAR or other med.  RTC in 1 week if no better Her updated medication list for this problem includes:    Symbicort 160-4.5 Mcg/act Aero (Budesonide-formoterol fumarate) ..... Sig:  2puffs by mouth two times a day disp: 1 hfa    Singulair 10 Mg Tabs (Montelukast sodium) ..... One tab by mouth daily    Proventil Hfa 108 (90 Base) Mcg/act Aers (Albuterol sulfate) .Marland Kitchen... 1-2 puffs every 4hours as needed    Prednisone 20 Mg Tabs (Prednisone) .Marland Kitchen... Take 2 tabs for 6 days  Orders: [facility]- Est  Level 4 ([address])  Problem # 2:  MORBID OBESITY (ICD-278.01) Assessment: Deteriorated gained weight 20lbs.  will defer nutriion c/s as pt not interested at this time.  they take their son to see Dr. Gerilyn Pilgrim so shoul dbe getting some instruction.   Orders: [facility]- Est  Level 4 ([address]562)  Complete Medication List: 1)  Symbicort 160-4.5 Mcg/act Aero  (Budesonide-formoterol fumarate) .... Sig: 2puffs by mouth two times a day disp: 1 hfa 2)  Singulair 10 Mg Tabs (Montelukast sodium) .... One tab by mouth daily 3)  Proventil Hfa 108 (90 Base) Mcg/act Aers (Albuterol sulfate) .Marland Kitchen.. 1-2 puffs every 4hours as needed 4)  Nasonex 50 Mcg/act Susp (Mometasone furoate) .... Two puffs once daily 5)  Zyrtec Allergy 10 Mg Tabs (Cetirizine hcl) .Marland Kitchen.. 1 once daily 6)  Norvasc 10 Mg Tabs (Amlodipine besylate) .... One daily, dosage change 7)  Zestoretic 20-12.5 Mg Tabs (Lisinopril-hydrochlorothiazide) .... 2 by mouth daily 8)  Omeprazole 40 Mg Cpdr (Omeprazole) .... One by mouth two times a day 9)  Ditropan 5 Mg Tabs (Oxybutynin chloride) .... One by mouth two times a day 10)  Cymbalta 60 Mg Cpep (Duloxetine hcl) .... Take one capsule daily 11)  Multivitamins Caps (Multiple vitamin) .... One by mouth daily 12)  Polyethylene Glycol 3350 Powd (Polyethylene glycol 3350) .Marland KitchenMarland KitchenMarland Kitchen 17 g daily 13)  Gabapentin 300 Mg Caps (Gabapentin) .Marland Kitchen.. 1 tab by mouth three times a day 14)  Depo-provera 150 Mg/ml Susp (Medroxyprogesterone acetate) .... Q 3 months 15)  Prednisone 20 Mg Tabs (Prednisone) .... Take 2 tabs for 6 days  Other Orders: Pap Smear-[facility] ([id number])  Patient Instructions: 1)  Please follow up in 1week if not better. 2)  Please make an appt once you feel better for your physical.   3)  Please do not take decongestants because they raise your blood pressure. 4)  for your asthma:  use the albuterol every 6 hours for the next 2-3 days.  Once you feel a little better, you can back off on it.   5)  Take the prednisone for 6 days. 6)  Take the simbicort every day two times a day 7)  PLease go to the ED or call us if you have trouble breathing that does not get better with albuterol. Prescriptions: PREDNISONE 20 MG TABS (PREDNISONE) take 2 tabs for 6 days  #12 x 0   Entered and Authorized by:   Ellery Plunk MD   Signed by:   Ellery Plunk MD on 11/21/2009    Method used:   Electronically to        [facility]  [address] (retail)       [address]       [place], [place]  [address]       Ph: [phone] or [phone]       Fax: [phone]   RxID:   [id number] ZESTORETIC 20-12.5 MG TABS (LISINOPRIL-HYDROCHLOROTHIAZIDE) 2 by mouth daily  #60 x 6   Entered and Authorized by:   Ellery Plunk MD   Signed by:   Ellery Plunk MD on 11/21/2009   Method used:   Elect[facility]ically to  [facility]  [address] (retail)       [address]       [place], [place]  [address]       Ph: [phone] or [phone]       Fax: [phone]   RxID:   [id number] NORVASC 10 MG  TABS (AMLODIPINE BESYLATE) one daily, dosage change  #30 Tablet x 6   Entered and Authorized by:   Ellery Plunk MD   Signed by:   Ellery Plunk MD on 11/21/2009   Method used:   [facility]        [facility]  [id number] (retail)       [address]       [place], [place]  [address]       Ph: [phone] or [phone]       Fax: [phone]   RxID:   [id number] PROVENTIL HFA 108 (90 BASE) MCG/ACT  AERS (ALBUTEROL SULFATE) 1-2 puffs every 4hours as needed  #1 x 2   Entered and Authorized by:   Ellery Plunk MD   Signed by:   Ellery Plunk MD on 11/21/2009   Method used:   [facility]        [facility]  [id number] (retail)       [address]       [place], [place]  [address]       Ph: [phone] or [phone]       Fax: [phone]   RxID:   [id number] SYMBICORT 160-4.5 MCG/ACT AERO (BUDESONIDE-FORMOTEROL FUMARATE) SIG: 2puffs by mouth two times a day DISP: 1 HFA  #1 x 5   Entered and Authorized by:   Ellery Plunk MD   Signed by:   Ellery Plunk MD on 11/21/2009   Method used:   [facility]        [facility]  [id number] (retail[facility]       [address]       [place], [place]  [address]       Ph: [phone] or [phone]       Fax: [phone]   RxID:   [id number]    Orders  Added: 1)  Pap Smear-[facility] [[id number]] 2)  [facility]- Est  Level 4 [[address]]

## 2010-02-26 NOTE — Assessment & Plan Note (Signed)
Summary: CPE/KH   Vital Signs:  Patient profile:   45 year old female Height:      63.5 inches Weight:      373 pounds BMI:     65.27 Temp:     98.9 degrees F oral Pulse rate:   100 / minute BP sitting:   143 / 74  (left arm) Cuff size:   large  Vitals Entered By: Garen Grams LPN (December 02, 2009 2:08 PM) CC: GI illness and pruritic scalp Is Patient Diabetic? No Pain Assessment Patient in pain? no        Primary Care Brytni Dray:  Ellery Plunk MD  CC:  GI illness and pruritic scalp.  History of Present Illness: GI illnesss- cramping and bloating x 1 day then diarrhea today.  no blood or mucous in diarrhea.  fever yesterday.  has not taken any meds.   itching scalp- for 1 month has had burnign and itching of scalp.  no hair loss.  painful to scratch.  no one with rashes, lice at home.  Habits & Providers  Alcohol-Tobacco-Diet     Tobacco Status: never  Current Medications (verified): 1)  Symbicort 160-4.5 Mcg/act Aero (Budesonide-Formoterol Fumarate) .... Sig: 2puffs By Mouth Two Times A Day Disp: 1 Hfa 2)  Singulair 10 Mg Tabs (Montelukast Sodium) .... One Tab By Mouth Daily 3)  Proventil Hfa 108 (90 Base) Mcg/act  Aers (Albuterol Sulfate) .Marland Kitchen.. 1-2 Puffs Every 4hours As Needed 4)  Nasonex 50 Mcg/act Susp (Mometasone Furoate) .... Two Puffs Once Daily 5)  Zyrtec Allergy 10 Mg  Tabs (Cetirizine Hcl) .Marland Kitchen.. 1 Once Daily 6)  Norvasc 10 Mg  Tabs (Amlodipine Besylate) .... One Daily, Dosage Change 7)  Zestoretic 20-12.5 Mg Tabs (Lisinopril-Hydrochlorothiazide) .... 2 By Mouth Daily 8)  Omeprazole 40 Mg Cpdr (Omeprazole) .... One By Mouth Two Times A Day 9)  Ditropan 5 Mg  Tabs (Oxybutynin Chloride) .... One By Mouth Two Times A Day 10)  Cymbalta 60 Mg Cpep (Duloxetine Hcl) .... Take One Capsule Daily 11)  Multivitamins  Caps (Multiple Vitamin) .... One By Mouth Daily 12)  Polyethylene Glycol 3350  Powd (Polyethylene Glycol 3350) .Marland KitchenMarland KitchenMarland Kitchen 17 G Daily 13)  Gabapentin 300 Mg Caps  (Gabapentin) .Marland Kitchen.. 1 Tab By Mouth Three Times A Day 14)  Depo-Provera 150 Mg/ml Susp (Medroxyprogesterone Acetate) .... Q 3 Months 15)  Zofran 8 Mg Tabs (Ondansetron Hcl) .... Take One Q6hours For Nausea  Allergies (verified): No Known Drug Allergies  Review of Systems       The patient complains of fever.  The patient denies anorexia, weight loss, and chest pain.    Physical Exam  General:  vs reviewed, morbidly obese Head:  thick hair, no lice or eggs, no rash in scalp Lungs:  no wheeze or rhonchi Heart:  Normal rate and regular rhythm. S1 and S2 normal without gallop, murmur, click, rub or other extra sounds. Abdomen:  diffusely tender, hyperactive BS   Impression & Recommendations:  Problem # 1:  GASTROENTERITIS, VIRAL, ACUTE (ICD-008.8) Assessment New likely simply viral gastro, but eyes seem slightly yellow.  will check LFTs Orders: [facility]- Est Level  3 (95638)  Problem # 2:  DERMATITIS, SCALP (ICD-692.9) Assessment: New no sign of what could be causing irritation.  will refer to derm for further eval The following medications were removed from the medication list:    Prednisone 20 Mg Tabs (Prednisone) .Marland Kitchen... Take 2 tabs for 6 days Her updated medication list for this problem includes:  Zyrtec Allergy 10 Mg Tabs (Cetirizine hcl) .Marland Kitchen... 1 once daily  Orders: Dermatology Referral (Derma) [facility]- Est Level  3 (16109)  Complete Medication List: 1)  Symbicort 160-4.5 Mcg/act Aero (Budesonide-formoterol fumarate) .... Sig: 2puffs by mouth two times a day disp: 1 hfa 2)  Singulair 10 Mg Tabs (Montelukast sodium) .... One tab by mouth daily 3)  Proventil Hfa 108 (90 Base) Mcg/act Aers (Albuterol sulfate) .Marland Kitchen.. 1-2 puffs every 4hours as needed 4)  Nasonex 50 Mcg/act Susp (Mometasone furoate) .... Two puffs once daily 5)  Zyrtec Allergy 10 Mg Tabs (Cetirizine hcl) .Marland Kitchen.. 1 once daily 6)  Norvasc 10 Mg Tabs (Amlodipine besylate) .... One daily, dosage change 7)  Zestoretic 20-12.5 Mg  Tabs (Lisinopril-hydrochlorothiazide) .... 2 by mouth daily 8)  Omeprazole 40 Mg Cpdr (Omeprazole) .... One by mouth two times a day 9)  Ditropan 5 Mg Tabs (Oxybutynin chloride) .... One by mouth two times a day 10)  Cymbalta 60 Mg Cpep (Duloxetine hcl) .... Take one capsule daily 11)  Multivitamins Caps (Multiple vitamin) .... One by mouth daily 12)  Polyethylene Glycol 3350 Powd (Polyethylene glycol 3350) .Marland KitchenMarland KitchenMarland Kitchen 17 g daily 13)  Gabapentin 300 Mg Caps (Gabapentin) .Marland Kitchen.. 1 tab by mouth three times a day 14)  Depo-provera 150 Mg/ml Susp (Medroxyprogesterone acetate) .... Q 3 months 15)  Zofran 8 Mg Tabs (Ondansetron hcl) .... Take one q6hours for nausea  Other Orders: Comp Met-[facility] [phone]) CBC-[facility] (91478) Flu Vaccine 5yrs + (29562) Admin 1st Vaccine (13086) Depo-Provera 150mg  (V7846)  Patient Instructions: 1)  It was nice to see you today.  I am sorry that you are sick again! 2)  I am sending in zofran--that should help with the nausea/diarrhea. 3)  I would like you to get your CPAP fixed.  That will help with your energy and breathing. 4)  Please make an appt with Dr> Raymondo Band for lung function testing 5)  Please come back for your well woman. Prescriptions: ZOFRAN 8 MG TABS (ONDANSETRON HCL) take one q6hours for nausea  #15 x 1   Entered and Authorized by:   Ellery Plunk MD   Signed by:   Ellery Plunk MD on 12/02/2009   Method used:   [facility]        [facility]  [address] [id number]retail[facility]       [address]       [place], [place]  [address]       Ph: [phone] or [phone]       Fax: [phone]   RxID:   [id number]    Medication Administration  Injection # 1:    Medication: Depo-Provera 150mg     Diagnosis: CONTRACEPTIVE MANAGEMENT (ICD-V25.09)    Route: IM    Site: L deltoid    Exp Date: 03/25/2012    Lot #: [id number]    Mfr: Francisca December    Comments: Next Depo Due: January 24 - February 7    Patient tolerated injection without complications     Given by: Garen Grams LPN (December 02, 2009 3:18 PM)  Orders Added: 1)  Comp Met-[facility] [[id number]] 2)  CBC-[facility] [85027] 3)  Flu Vaccine 87yrs + [90658] 4)  Admin 1st Vaccine [90471] 5)  Depo-Provera 150mg  [J1055] 6)  Dermatology Referral [Derma] 7)  [facility]- Est Level  3 [20254]   Immunizations Administered:  Influenza Vaccine # 1:    Vaccine Type: Fluvax 3+    Site: right deltoid    Mfr: GlaxoSmithKline    Dose: 0.5 ml  Route: IM    Given by: Garen Grams LPN    Exp. Date: 07/22/2010    Lot #: [id number]    VIS given: 08/19/09 version given December 02, 2009.  Flu Vaccine Consent Questions:    Do you have a history of severe allergic reactions to this vaccine? no    Any prior history of allergic reactions to egg and/or gelatin? no    Do you have a sensitivity to the preservative Thimersol? no    Do you have a past history of Guillan-Barre Syndrome? no    Do you currently have an acute febrile illness? no    Have you ever had a severe reaction to latex? no    Vaccine information given and explained to patient? yes    Are you currently pregnant? no   Immunizations Administered:  Influenza Vaccine # 1:    Vaccine Type: Fluvax 3+    Site: right deltoid    Mfr: GlaxoSmithKline    Dose: 0.5 ml    Route: IM    Given by: Garen Grams LPN    Exp. Date: 07/22/2010    Lot #: [id number]    VIS given: 08/19/09 version given December 02, 2009.     Medication Administration  Injection # 1:    Medication: Depo-Provera 150mg     Diagnosis: CONTRACEPTIVE MANAGEMENT (ICD-V25.09)    Route: IM    Site: L deltoid    Exp Date: 03/25/2012    Lot #: [id number]    Mfr: Francisca December    Comments: Next Depo Due: January 24 - February 7    Patient tolerated injection without complications    Given by: Garen Grams LPN (December 02, 2009 3:18 PM)  Orders Added: 1)  Comp Met-[facility] [[id number]] 2)  CBC-[facility] [85027] 3)  Flu Vaccine 66yrs + [90658] 4)  Admin 1st Vaccine [90471] 5)  Depo-Provera  150mg  [J1055] 6)  Dermatology Referral [Derma] 7)  [facility]- Est Level  3 [40102]

## 2010-02-26 NOTE — Assessment & Plan Note (Signed)
Summary: INJECTION IN BOTH KNEES,MC   Vital Signs:  Patient profile:   45 year old female Height:      63.5 inches Weight:      373 pounds Pulse rate:   108 / minute BP sitting:   153 / 103  Vitals Entered By: Rochele Pages RN (January 12, 2010 4:15 PM) CC: bilat knee pain- wants injections   Primary Care Provider:  Ellery Plunk MD  CC:  bilat knee pain- wants injections.  History of Present Illness: 1) B knee pain, worsening over last few months. last injection was in July and was helpful  2) asthma symtpoms last 2 days--cough worsening, productive of tan sputum. has been using inihalers with l;ittle help  Habits & Providers  Alcohol-Tobacco-Diet     Tobacco Status: never  Allergies: No Known Drug Allergies  Physical Exam  General:  alert and overweight-appearing.   Lungs:  expiratory wheezes B all lung fields. noaccessory muscle use. rr 14 Heart:  normal rate and regular rhythm.   Msk:  B knees TTP medial joint line. No effusions or erythema. no warmth Additional Exam:  Patient given informed consent for injection. Discussed possible complications of infection, bleeding or skin atrophy at site of injection. Possible side effect of avascular necrosis (focal area of bone death) due to steroid use.Appropriate verbal time out taken Are cleaned and prepped in usual sterile fashion. A -1--- cc kennalog plus ---4-cc 1% lidocaine without epinephrine was injected into the each knee using anterior approach---. Patient tolerated procedure well with no complications.    Impression & Recommendations:  Problem # 1:  GENERALIZED OSTEOARTHROSIS UNSPECIFIED SITE (ICD-715.00) B knee injections  Problem # 2:  ASTHMA, PERSISTENT (ICD-493.90)  Her updated medication list for this problem includes:    Symbicort 160-4.5 Mcg/act Aero (Budesonide-formoterol fumarate) ..... Sig: 2puffs by mouth two times a day disp: 1 hfa    Singulair 10 Mg Tabs (Montelukast sodium) ..... One tab by mouth  daily    Proventil Hfa 108 (90 Base) Mcg/act Aers (Albuterol sulfate) .Marland Kitchen... 1-2 puffs every 4hours as needed    Prednisone 50 Mg Tabs (Prednisone) .Marland Kitchen... 1 by mouth once daily for five days urged to f/u at [facility] tomorrow if not improving  Complete Medication List: 1)  Symbicort 160-4.5 Mcg/act Aero (Budesonide-formoterol fumarate) .... Sig: 2puffs by mouth two times a day disp: 1 hfa 2)  Singulair 10 Mg Tabs (Montelukast sodium) .... One tab by mouth daily 3)  Proventil Hfa 108 (90 Base) Mcg/act Aers (Albuterol sulfate) .Marland Kitchen.. 1-2 puffs every 4hours as needed 4)  Nasonex 50 Mcg/act Susp (Mometasone furoate) .... Two puffs once daily 5)  Zyrtec Allergy 10 Mg Tabs (Cetirizine hcl) .Marland Kitchen.. 1 once daily 6)  Norvasc 10 Mg Tabs (Amlodipine besylate) .... One daily, dosage change 7)  Zestoretic 20-12.5 Mg Tabs (Lisinopril-hydrochlorothiazide) .... 2 by mouth daily 8)  Omeprazole 40 Mg Cpdr (Omeprazole) .... One by mouth two times a day 9)  Ditropan 5 Mg Tabs (Oxybutynin chloride) .... One by mouth two times a day 10)  Cymbalta 60 Mg Cpep (Duloxetine hcl) .... Take one capsule daily 11)  Multivitamins Caps (Multiple vitamin) .... One by mouth daily 12)  Polyethylene Glycol 3350 Powd (Polyethylene glycol 3350) .Marland KitchenMarland KitchenMarland Kitchen 17 g daily 13)  Gabapentin 300 Mg Caps (Gabapentin) .Marland Kitchen.. 1 tab by mouth three times a day 14)  Depo-provera 150 Mg/ml Susp (Medroxyprogesterone acetate) .... Q 3 months 15)  Zofran 8 Mg Tabs (Ondansetron hcl) .... Take one q6hours for nausea  16)  Prednisone 50 Mg Tabs (Prednisone) .Marland Kitchen.. 1 by mouth once daily for five days 17)  Azithromycin 250 Mg Tabs (Azithromycin) .... 2 by mouth today and one by mouth once daily for four more days  Other Orders: Joint Aspirate / Injection, Large (20610) Kenalog 10 mg inj (Z6109) Prescriptions: AZITHROMYCIN 250 MG TABS (AZITHROMYCIN) 2 by mouth today and one by mouth once daily for four more days  #6 x 0   Entered and Authorized by:   Denny Levy MD   Signed  by:   Denny Levy MD on 01/12/2010   Method used:   [facility]        [facility]  [address] [facility]       [address]       [place], [place]  [address]       Ph: [phone] or [phone]       Fax: [phone]   RxID:   [id number] PREDNISONE 50 MG TABS (PREDNISONE) 1 by mouth once daily for five days  #5 x 0   Entered and Authorized by:   Denny Levy MD   Signed by:   Denny Levy MD on 01/12/2010   Method used:   [facility]        [facility]  [address] [facility]       [address]       [place], [place]  [address]       Ph: [phone] or [phone]       Fax: [phone]   RxID:   [id number]    Orders Added: 1)  Est. Patient Level IV [27062] 2)  Joint Aspirate / Injection, Large [20610] 3)  Kenalog 10 mg inj [J3301]

## 2010-02-26 NOTE — Miscellaneous (Signed)
Summary: prior auth  Clinical Lists Changes prior auth for symbicort thru medicaid to pcp.Golden Circle RN  March 07, 2009 10:37 AM  done... place in to be faxed. Romero Belling MD  March 07, 2009 12:18 PM

## 2010-02-26 NOTE — Assessment & Plan Note (Signed)
Summary: cough & vomiting x 2 days/[place]   Vital Signs:  Patient profile:   45 year old female O2 Sat:      98 % on Room air Pulse rate:   80 / minute Resp:     20 per minute BP supine:   138 / 76  O2 Flow:  Room air  Primary Care Provider:  Romero Belling MD   History of Present Illness: 45 yo old female with coughing, headache, vomitting x 1 day.  Cleaned oven yesterday, feels like maybe she inhaled some chemicals.  endorses cough that increased after using the chemicals as well as episodes of vomitting after long spells of coughing.  Currently c/o sore throat and nausea that just started this AM.  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 Sig: 2puffs By Mouth Two Times A Day Disp: 1 Hfa 2)  Singulair 10 Mg Tabs (Montelukast Sodium) .... One Tab By Mouth Daily 3)  Proventil Hfa 108 (90 Base) Mcg/act  Aers (Albuterol Sulfate) .Marland Kitchen.. 1-2 Puffs Every 4hours As Needed 4)  Nasonex  50 Mcg/act Susp (Mometasone Furoate) .... Two Puffs Once Daily 5)  Zyrtec Allergy 10 Mg  Tabs (Cetirizine Hcl) .Marland Kitchen.. 1 Once Daily 6)  Norvasc 10 Mg  Tabs (Amlodipine Besylate) .... One Daily, Dosage Change 7)  Zestoretic 20-12.5 Mg Tabs (Lisinopril-Hydrochlorothiazide) .... 2 By Mouth Daily 8)  Omeprazole 40 Mg Cpdr (Omeprazole) .... One By Mouth Two Times A Day 9)  Ditropan 5 Mg  Tabs (Oxybutynin Chloride) .... One By Mouth Two Times A Day 10)  Cymbalta 60 Mg Cpep (Duloxetine Hcl) .... Take One Capsule Daily 11)  Multivitamins  Caps (Multiple Vitamin) .... One By Mouth Daily 12)  Polyethylene Glycol 3350  Powd (Polyethylene Glycol 3350) .Marland KitchenMarland KitchenMarland Kitchen 17 G Daily 13)  Gabapentin 300 Mg Caps (Gabapentin) .Marland Kitchen.. 1 Tab By Mouth Three Times A Day 14)  Home Blood Pressure Monitor .... Use As Directed.  Allergies (verified): No Known Drug Allergies  Review of Systems       The patient complains of prolonged cough and headaches.  The patient denies fever, hoarseness, chest pain, and syncope.    Physical Exam  General:  VS reviewed, face mask in place, NAD, well-nourished and well-hydrated.   Nose:  no rhinorrhea no external erythema and no nasal discharge.   Mouth:  pharynx pink and moist, no erythema, and no exudates.   Neck:  supple, full ROM, and no masses.   Lungs:  normal respiratory effort, no intercostal retractions, and no accessory muscle use, good air movement thru all fields, very minimal exp  wheezes at LLB, no crackles Heart:  Normal rate and regular rhythm. S1 and S2 normal without gallop, murmur, click, rub or other extra sounds. Extremities:  No clubbing, cyanosis, edema, or deformity noted with normal full range of motion of all joints.   Skin:  turgor normal, color normal, and no rashes.     Impression & Recommendations:  Problem # 1:  ASTHMA, WITH ACUTE EXACERBATION (ICD-493.92) Assessment New gave albuterol neb in office, responded well to this and felt better after treatment.  Will  give 5 day course of prednisone, split into 2 daily doses due to n/v.  Encouraged pt to continue her home meds as prescribed, as she has not been taking since saturday.  Also gave script for albuterol neb (already has machine) and pt feels these are more effective for her than the inhaler.  If needs more frequently than 4 hours, needs to be seen.  Encouraged prompt return if no better in 48 hours or if has difficulty breathing to go to ED.  Precepted with Dr. Mauricio [name] Her updated medication list for this problem includes:    Symbicort 160-4.5 Mcg/act Aero (Budesonide-formoterol fumarate) ..... Sig: 2puffs by mouth two times a day disp: 1 hfa    Singulair 10 Mg Tabs (Montelukast sodium) ..... One tab by mouth daily    Proventil Hfa 108 (90 Base) Mcg/act Aers (Albuterol sulfate) .Marland Kitchen... 1-2 puffs every 4hours as needed    Prednisone 20 Mg Tabs (Prednisone) .Marland Kitchen... Take 1 pill every 12 hours two times a day for 5 days    Albuterol Sulfate (5 Mg/ml) 0.5% Nebu (Albuterol sulfate) ..... Use every 4 hours as needed for sob  Problem # 2:  CONTRACEPTIVE MANAGEMENT (ICD-V25.09) Assessment: Unchanged  Orders: U Preg-[facility] (04540) Depo-Provera 150mg  (J1055)  Complete Medication List: 1)  Symbicort 160-4.5 Mcg/act Aero (Budesonide-formoterol fumarate) .... Sig: 2puffs by mouth two times a day disp: 1 hfa 2)  Singulair 10 Mg Tabs (Montelukast sodium) .... One tab by mouth daily 3)  Proventil Hfa 108 (90 Base) Mcg/act Aers (Albuterol sulfate) .Marland Kitchen.. 1-2 puffs every 4hours as needed 4)  Nasonex 50 Mcg/act Susp (Mometasone furoate) .... Two puffs once daily 5)  Zyrtec Allergy 10 Mg Tabs (Cetirizine hcl) .Marland Kitchen.. 1 once daily 6)  Norvasc 10 Mg Tabs (Amlodipine besylate) .... One daily, dosage change 7)  Zestoretic 20-12.5 Mg Tabs (Lisinopril-hydrochlorothiazide) .... 2 by mouth daily 8)  Omeprazole 40 Mg Cpdr (Omeprazole) .... One by mouth two times a day 9)  Ditropan 5 Mg Tabs (Oxybutynin chloride) .... One by mouth two  times a day 10)  Cymbalta 60 Mg Cpep (Duloxetine hcl) .... Take one capsule daily 11)  Multivitamins Caps (Multiple vitamin) .... One by mouth daily 12)  Polyethylene Glycol 3350 Powd (Polyethylene glycol 3350) .Marland KitchenMarland KitchenMarland Kitchen 17 g daily 13)  Gabapentin 300 Mg Caps (Gabapentin) .Marland Kitchen.. 1 tab by mouth three times a day 14)  Home Blood Pressure Monitor  .... Use as directed. 15)  Prednisone 20 Mg Tabs (Prednisone) .... Take 1 pill every 12 hours two times a day for 5 days 16)  Albuterol Sulfate (5 Mg/ml) 0.5% Nebu (Albuterol sulfate) .... Use every 4 hours as needed for sob  Other Orders: Nebulizer- [facility] [phone]) Albuterol Sulfate Sol 1mg  unit dose (J4782) [facility]- Est Level  3 (95621)  Patient Instructions: 1)  I am giving you prednisone, please take 1 pill twice a day for 5 days.  This should help your breathing. 2)  Make sure you drink plenty of fluids  3)  Use your albuterol as often as every 4 hours if it helps 4)  If you have worsening shortness of breath, please go to the ED 5)  Use chloroseptic spray for your throat Prescriptions: ALBUTEROL SULFATE (5 MG/ML) 0.5% NEBU (ALBUTEROL SULFATE) Use every 4 hours as needed for SOB  #10 x 0   Entered and Authorized by:   Alvia Grove DO   Signed by:   Alvia Grove DO on 04/01/2009   Method used:   Handwritten   RxID:   [id number] PREDNISONE 20 MG TABS (PREDNISONE) take 1 pill every 12 hours two times a day for 5 days  #10 x 0   Entered and Authorized by:   Alvia Grove DO   Signed by:   Alvia Grove DO on 04/01/2009   Method used:   Handwritten   RxID:   [id number]   Laboratory Results   Urine Tests  Date/Time Received: March 31, 2009 11:12 AM  Date/Time Reported: March 31, 2009 11:17 AM     Urine HCG: negative Comments: ...........test performed by...........Marland KitchenTerese Door, CMA      Medication Administration  Injection # 1:    Medication: Depo-Provera 150mg     Diagnosis: CONTRACEPTIVE MANAGEMENT (ICD-V25.09)     Route: IM    Site: R deltoid    Exp Date: 05/26/2011    Lot #: [id number]    Mfr: greenstone    Comments: Pt informed RTC may 23 - june 6    Patient tolerated injection without complications    Given by: Jone Baseman CMA (March 31, 2009 11:55 AM)  Medication # 1:    Medication: Albuterol Sulfate Sol 1mg  unit dose    Diagnosis: ASTHMA (ICD-493.90)    Dose: 2.5mg     Route: inhaled    Exp Date: 09/26/2010    Lot #: [id number]    Mfr: nephron    Patient tolerated medication without complications    Given by: Jone Baseman CMA (March 31, 2009 11:54 AM)  Orders Added: 1)  U Preg-[facility] [81025] 2)  Nebulizer- [facility] [95284] 3)  Albuterol Sulfate Sol 1mg  unit dose [J7613] 4)  Depo-Provera 150mg  [J1055] 5)  [facility]- Est Level  3 [13244]

## 2010-02-26 NOTE — Miscellaneous (Signed)
Summary: walk in  Clinical Lists Changes walked in stating a bad cough with vomiting started yesterday. has a "burning, sore throat". place in work in now.Golden Circle RN  March 31, 2009 9:37 AM

## 2010-02-26 NOTE — Assessment & Plan Note (Signed)
Summary: refill meds.  No charge   Vitals Entered By: Golden Circle RN (October 29, 2009 4:14 PM)  Primary Care Provider:  Ellery Plunk MD   History of Present Illness: 45 yo female here for PE with PCP.  Pt insurance will not pay for PE until after 11-15-09.  Meds refilled today and pt to follow up with Dr. Hal Hope after 11-15-09.  No charge for OV today.   Habits & Providers  Alcohol-Tobacco-Diet     Tobacco Status: never  Allergies: No Known Drug Allergies   Complete Medication List: 1)  Symbicort 160-4.5 Mcg/act Aero (Budesonide-formoterol fumarate) .... Sig: 2puffs by mouth two times a day disp: 1 hfa 2)  Singulair 10 Mg Tabs (Montelukast sodium) .... One tab by mouth daily 3)  Proventil Hfa 108 (90 Base) Mcg/act Aers (Albuterol sulfate) .Marland Kitchen.. 1-2 puffs every 4hours as needed 4)  Nasonex 50 Mcg/act Susp (Mometasone furoate) .... Two puffs once daily 5)  Zyrtec Allergy 10 Mg Tabs (Cetirizine hcl) .Marland Kitchen.. 1 once daily 6)  Norvasc 10 Mg Tabs (Amlodipine besylate) .... One daily, dosage change 7)  Zestoretic 20-12.5 Mg Tabs (Lisinopril-hydrochlorothiazide) .... 2 by mouth daily 8)  Omeprazole 40 Mg Cpdr (Omeprazole) .... One by mouth two times a day 9)  Ditropan 5 Mg Tabs (Oxybutynin chloride) .... One by mouth two times a day 10)  Cymbalta 60 Mg Cpep (Duloxetine hcl) .... Take one capsule daily 11)  Multivitamins Caps (Multiple vitamin) .... One by mouth daily 12)  Polyethylene Glycol 3350 Powd (Polyethylene glycol 3350) .Marland KitchenMarland KitchenMarland Kitchen 17 g daily 13)  Gabapentin 300 Mg Caps (Gabapentin) .Marland Kitchen.. 1 tab by mouth three times a day 14)  Depo-provera 150 Mg/ml Susp (Medroxyprogesterone acetate) .... Q 3 months  Other Orders: No Charge Patient Arrived (NCPA0) (NCPA0)  Patient Instructions: 1)  Nice to meet you today! 2)  I have refilled all of your meds [facility] 3)  Please schedule a f/u with Dr. Ayesha Mohair after 11-15-09 Prescriptions: POLYETHYLENE GLYCOL 3350  POWD (POLYETHYLENE  GLYCOL 3350) 17 g daily  #300gram x 5   Entered and Authorized by:   Alvia Grove DO   Signed by:   Alvia Grove DO on 10/29/2009   Method used:   [facility]        [facility]  [place] (retail)       [address]       [place], [place]  [address]       Ph: [phone] or [phone]       Fax: [phone]   RxID:   [id number] CYMBALTA 60 MG CPEP (DULOXETINE HCL) take one capsule daily  #90 x 3   Entered and Authorized by:   Alvia Grove DO   Signed by:   Alvia Grove DO on 10/29/2009   Method used:   [facility]        [facility]  [place] (retail)       [address]       [place], [place]  [address]       Ph: [phone] or [phone]       Fax: [phone]   RxID:   [id number] DITROPAN 5 MG  TABS (OXYBUTYNIN CHLORIDE) one by mouth two times a day  #60 x 0   Entered and Authorized by:   Alvia Grove DO   Signed by:   Alvia Grove DO on 10/29/2009   Method used:   [facility]        [facility]  [place] (retail)       [address]       [place], [place]  [address]       Ph: [phone] or [phone]       Fax: [phone]   RxID:   [id number] OMEPRAZOLE 40 MG CPDR (OMEPRAZOLE) one by mouth two times a day  #30 x 5   Entered and Authorized by:   Alvia Grove DO   Signed by:   Alvia Grove DO on 10/29/2009   Method used:   [facility]        [facility]  [place] (retail)       [address]       [place], [place]  [address]       Ph: [phone] or [phone]       Fax: [phone]   RxID:   [id number] ZESTORETIC 20-12.5 MG TABS (LISINOPRIL-HYDROCHLOROTHIAZIDE) 2 by mouth daily  #60 x 2   Entered and Authorized by:   Alvia Grove DO   Signed by:   Alvia Grove DO on 10/29/2009   Method used:   [facility]        [facility]  [place] (retail)       [address]       [place], [place]  [address]       Ph: [phone] or [phone]       Fax:  [phone]   RxID:   [id number] NORVASC 10 MG  TABS (AMLODIPINE BESYLATE) one daily, dosage change  #30 Tablet x 11   Entered and Authorized by:   Alvia Grove DO   Signed by:   Alvia Grove DO on 10/29/2009   Method used:   [facility]        [facility]  [place] (retail)       [address]       [place], [place]  [address]       Ph: [phone] or [phone]       Fax: [phone]   RxID:   [id number] ZYRTEC ALLERGY 10 MG  TABS (CETIRIZINE HCL) 1 once daily  #30 x 11   Entered and Authorized by:   Alvia Grove DO   Signed by:   Alvia Grove DO on 10/29/2009   Method used:   [facility]ron[facility] to        [facility]  [place] (retail)       [address]       [place], [place]  [address]       Ph: [phone] or [phone]       Fax: [phone]   RxID:   [id number] PROVENTIL HFA 108 (90 BASE) MCG/ACT  AERS (ALBUTEROL SULFATE) 1-2 puffs every 4hours as needed  #3 x 5   Entered and Authorized by:   Alvia Grove DO   Signed by:   Alvia Grove DO on 10/29/2009   Method used:   [facility]        [facility]  [place] (retail)       [address]       [place], [place]  [address]       Ph: [phone] or [phone]       Fax: [phone]   RxID:   [id number] SINGULAIR 10 MG TABS (MONTELUKAST SODIUM) One tab by mouth daily  #30 Tablet x 9   Entered and Authorized by:   Alvia Grove DO   Signed by:   Alvia Grove DO on 10/29/2009   Method used:  [facility]        [facility]  [place]* (retail)       [address]       [place], [place]  [address]       Ph: [phone] or [phone]       Fax: [phone]   RxID:   [id number] SYMBICORT 160-4.5 MCG/ACT AERO (BUDESONIDE-FORMOTEROL FUMARATE) SIG: 2puffs by mouth two times a day DISP: 1 HFA  #1 x 5   Entered and Authorized by:   Alvia Grove DO   Signed by:   Alvia Grove DO on 10/29/2009   Method used:   [facility]         [facility]  [id number] (retail)       [address]       [place], [place]  [address]       Ph: [phone] or [phone]       Fax: [phone]   RxID:   [id number]   Appended Document: Orders Update    Clinical Lists Changes  Orders: Added new Service order of No Charge Patient Arrived (NCPA0) (NCPA0) - Signed

## 2010-02-26 NOTE — Assessment & Plan Note (Signed)
Summary: f/u URI/[place]   Vital Signs:  Patient profile:   45 year old female Height:      63.5 inches Weight:      357 pounds BMI:     62.47 BSA:     2.49 Temp:     99.5 degrees F Pulse rate:   128 / minute BP sitting:   114 / 85  Vitals Entered By: Jone Baseman CMA (March 06, 2009 3:19 PM) CC: fu uri Is Patient Diabetic? No Pain Assessment Patient in pain? yes     Location: all over Intensity: 10   Primary Care Provider:  Romero Belling MD  CC:  fu uri.  History of Present Illness: 1) URI: Seen at [facility] on 02/26/09 for URI and fever - treated with nebs x 2, discharged from ER with prednisone x 5 days, Tussionex, Zofran. Reports that she is still having shortness of breath, cough with clear to yellow sputum, runny nose. Fever has now resolved. Reports low energy, decreased appetite, some wheezing. Passive exposure to tobacco smoke. Obesity hypoventilation syndrome + asthma. Does not use her  Symbicort daily,  but she has been using her albuterol 4 times a day. Denies presyncope, fever, chest pain, emesis, diarrhea.   2) Gluteal cleft boil: Present x 1 1/2 weeks. Open and draining pus. Denies history of prior boils. Denies fever, chills. Reports URI as above. Son with hidradenitis today, hsuband with h/o boils.   Habits & Providers  Alcohol-Tobacco-Diet     Tobacco Status: never  Current Medications (verified): 1)  Symbicort 160-4.5 Mcg/act Aero (Budesonide-Formoterol Fumarate) .... Sig: 2puffs By Mouth Two Times A Day Disp: 1 Hfa 2)  Singulair 10 Mg Tabs (Montelukast Sodium) .... One Tab By Mouth Daily 3)  Proventil Hfa 108 (90 Base) Mcg/act  Aers (Albuterol Sulfate) .Marland Kitchen.. 1-2 Puffs Every 4hours As Needed 4)  Nasonex 50 Mcg/act Susp (Mometasone Furoate) .... Two Puffs Once Daily 5)  Zyrtec Allergy 10 Mg  Tabs (Cetirizine Hcl) .Marland Kitchen.. 1 Once Daily 6)  Norvasc 10 Mg  Tabs (Amlodipine Besylate) .... One Daily, Dosage Change 7)  Zestoretic 20-12.5 Mg Tabs  (Lisinopril-Hydrochlorothiazide) .... 2 By Mouth Daily 8)  Omeprazole 40 Mg Cpdr (Omeprazole) .... One By Mouth Two Times A Day 9)  Ditropan 5 Mg  Tabs (Oxybutynin Chloride) .... One By Mouth Two Times A Day 10)  Cymbalta 60 Mg Cpep (Duloxetine Hcl) .... Take One Capsule Daily 11)  Multivitamins  Caps (Multiple Vitamin) .... One By Mouth Daily 12)  Ultram 50 Mg Tabs (Tramadol Hcl) .... One Tab By Mouth Q6 As Needed For Pain 13)  Polyethylene Glycol 3350  Powd (Polyethylene Glycol 3350) .Marland KitchenMarland KitchenMarland Kitchen 17 G Daily 14)  Gabapentin 300 Mg Caps (Gabapentin) .Marland Kitchen.. 1 Tab By Mouth Three Times A Day 15)  Home Blood Pressure Monitor .... Use As Directed. 16)  Vicodin 5-500 Mg Tabs (Hydrocodone-Acetaminophen) .Marland Kitchen.. 1-2 By Mouth Two Times A Day As Needed Knee Pain 17)  Clindamycin Hcl 300 Mg Caps (Clindamycin Hcl) .... Two Tabs By Mouth Three Times A Day X 10 Days  Allergies (verified): No Known Drug Allergies  Physical Exam  General:  extremely obese, NAD  Ears:  small pimple in right ear canal with purulent material draining.  Nose:  no rhinorrhea  Mouth:  moist membranes, no erythema  Neck:  no lymphadenopathy   Lungs:  distant but clear w/o wheeze; normal work of breathing; Heart:  distant but regular rate and rhythm  Skin:  1 cm pilonidal cyst open  and draining w/o induration. mild fluctuance and erythema surrounding 2 cm   Impression & Recommendations:  Problem # 1:  ASTHMA (ICD-493.90) Assessment Unchanged  Mild exacerbation in face of resolving URI. Advised on importance of using Symbicort DAILY. Proventil as needed. O2 sats 96% on RA, no wheezing on exam, normal work of breathing. Dyspnea likely combination of asthma dna obesity hypoventilation. Follow up one week w/  PCP.  Her updated medication list for this problem includes:    Symbicort 160-4.5 Mcg/act Aero (Budesonide-formoterol fumarate) ..... Sig: 2puffs by mouth two times a day disp: 1 hfa    Singulair 10 Mg Tabs (Montelukast sodium) .....  One tab by mouth daily    Proventil Hfa 108 (90 Base) Mcg/act Aers (Albuterol sulfate) .Marland Kitchen... 1-2 puffs every 4hours as needed  Orders: [facility]- Est  Level 4 ([address])  Problem # 2:  PILONIDAL CYST (ICD-685.1) Assessment: New  Open and draining. Excision not indicated at this time. Will treat with clindamycin. Follow in one week with PCP.   Orders: [facility]- Est  Level 4 ([address])  Complete Medication List: 1)  Symbicort 160-4.5 Mcg/act Aero (Budesonide-formoterol fumarate) .... Sig: 2puffs by mouth two times a day disp: 1 hfa 2)  Singulair 10 Mg Tabs (Montelukast sodium) .... One tab by mouth daily 3)  Proventil Hfa 108 (90 Base) Mcg/act Aers (Albuterol sulfate) .Marland Kitchen.. 1-2 puffs every 4hours as needed 4)  Nasonex 50 Mcg/act Susp (Mometasone furoate) .... Two puffs once daily 5)  Zyrtec Allergy 10 Mg Tabs (Cetirizine hcl) .Marland Kitchen.. 1 once daily 6)  Norvasc 10 Mg Tabs (Amlodipine besylate) .... One daily, dosage change 7)  Zestoretic 20-12.5 Mg Tabs (Lisinopril-hydrochlorothiazide) .... 2 by mouth daily 8)  Omeprazole 40 Mg Cpdr (Omeprazole) .... One by mouth two times a day 9)  Ditropan 5 Mg Tabs (Oxybutynin chloride) .... One by mouth two times a day 10)  Cymbalta 60 Mg Cpep (Duloxetine hcl) .... Take one capsule daily 11)  Multivitamins Caps (Multiple vitamin) .... One by mouth daily 12)  Ultram 50 Mg Tabs (Tramadol hcl) .... One tab by mouth q6 as needed for pain 13)  Polyethylene Glycol 3350 Powd (Polyethylene glycol 3350) .Marland KitchenMarland KitchenMarland Kitchen 17 g daily 14)  Gabapentin 300 Mg Caps (Gabapentin) .Marland Kitchen.. 1 tab by mouth three times a day 15)  Home Blood Pressure Monitor  .... Use as directed. 16)  Vicodin 5-500 Mg Tabs (Hydrocodone-acetaminophen) .Marland Kitchen.. 1-2 by mouth two times a day as needed knee pain 17)  Clindamycin Hcl 300 Mg Caps (Clindamycin hcl) .... Two tabs by mouth three times a day x 10 days  Patient Instructions: 1)  Follow up in one week.  2)  Take youtr antibiotic as directed.  3)  You can take Tylenol or  ibuprofen for pain.  4)  Use your inhalers as directed. 5)  If you start to develop worsening shortness of breath, fever not treated by Tylenol or ibuprofen come back in to be seen.  6)  Follow up with your regular doctor, Dr. Constance Goltz, next week about your breathing and your boil and any other issues that we did not cover today Prescriptions: SYMBICORT 160-4.5 MCG/ACT AERO (BUDESONIDE-FORMOTEROL FUMARATE) SIG: 2puffs by mouth two times a day DISP: 1 HFA  #1 x 5   Entered and Authorized by:   Bobby Rumpf  MD   Signed by:   Bobby Rumpf  MD on 03/06/2009   Method used:   [facility]        [facility]  [place] (  retail)       [address]       [place], [place]  [address]       Ph: [phone] or [phone]       Fax: [phone]   RxID:   [id number] PROVENTIL HFA 108 (90 BASE) MCG/ACT  AERS (ALBUTEROL SULFATE) 1-2 puffs every 4hours as needed  #1 x 1   Entered and Authorized by:   Bobby Rumpf  MD   Signed by:   Bobby Rumpf  MD on 03/06/2009   Method used:   [facility]        [facility]  [place] (retail)       [address]       [place], [place]  [address]       Ph: [phone] or [phone]       Fax: [phone]   RxID:   [id number] CLINDAMYCIN HCL 300 MG CAPS (CLINDAMYCIN HCL) two tabs by mouth three times a day x 10 days  #60 x 0   Entered and Authorized by:   Bobby Rumpf  MD   Signed by:   Bobby Rumpf  MD on 03/06/2009   Method used:   [facility]        [facility]  [place] (retail)       [address]       [place], [place]  [address]       Ph: [phone] or [phone]       Fax: [phone]   RxID:   [id number]

## 2010-02-27 NOTE — Letter (Signed)
Summary: [employer] ([employer] Supplies)  [employer] ([employer] Supp[employer])   Imported By: Esmeralda Links D'jimraou 03/15/2008 11:07:38  _____________________________________________________________________  External Attachment:    Type:   Image     Comment:   External Document

## 2010-03-09 ENCOUNTER — Other Ambulatory Visit: Payer: Self-pay | Admitting: Family Medicine

## 2010-03-09 NOTE — Telephone Encounter (Signed)
Refill request

## 2010-03-10 ENCOUNTER — Telehealth: Payer: Self-pay | Admitting: Family Medicine
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
## 2010-03-10 NOTE — Telephone Encounter (Signed)
Spoke with pt.  Pt definitely needs to be seen.  rec she call an ambulance if cannot drive to hospital.  Pt agrees to be seen.

## 2010-03-25 ENCOUNTER — Encounter: Payer: Self-pay | Admitting: *Deleted

## 2010-03-26 ENCOUNTER — Encounter: Payer: Self-pay | Admitting: Family Medicine

## 2010-03-26 ENCOUNTER — Encounter: Payer: Self-pay | Admitting: *Deleted

## 2010-03-26 ENCOUNTER — Ambulatory Visit (INDEPENDENT_AMBULATORY_CARE_PROVIDER_SITE_OTHER): Payer: Medicaid Other | Admitting: Family Medicine

## 2010-03-26 VITALS — BP 173/103 | HR 96 | Temp 99.0°F | Wt 383.6 lb

## 2010-03-26 DIAGNOSIS — R05 Cough: Secondary | ICD-10-CM | POA: Insufficient documentation

## 2010-03-26 DIAGNOSIS — R053 Chronic cough: Secondary | ICD-10-CM | POA: Insufficient documentation

## 2010-03-26 DIAGNOSIS — R059 Cough, unspecified: Secondary | ICD-10-CM

## 2010-03-26 DIAGNOSIS — M545 Low back pain, unspecified: Secondary | ICD-10-CM

## 2010-03-26 DIAGNOSIS — K219 Gastro-esophageal reflux disease without esophagitis: Secondary | ICD-10-CM

## 2010-03-26 DIAGNOSIS — M549 Dorsalgia, unspecified: Secondary | ICD-10-CM

## 2010-03-26 DIAGNOSIS — Z309 Encounter for contraceptive management, unspecified: Secondary | ICD-10-CM

## 2010-03-26 MED ORDER — BENZONATATE 100 MG PO CAPS: 100.0000 mg | ORAL_CAPSULE | Freq: Four times a day (QID) | ORAL | Status: DC | PRN

## 2010-03-26 MED ORDER — HYDROCODONE-ACETAMINOPHEN 7.5-500 MG PO TABS: 1.0000 | ORAL_TABLET | ORAL | Status: DC | PRN

## 2010-03-26 MED ORDER — MEDROXYPROGESTERONE ACETATE 150 MG/ML IM SUSP
150.0000 mg | Freq: Once | INTRAMUSCULAR | Status: AC
  Administered 2010-03-26: 150 mg via INTRAMUSCULAR

## 2010-03-26 MED ORDER — OMEPRAZOLE 40 MG PO CPDR: 40.0000 mg | DELAYED_RELEASE_CAPSULE | Freq: Two times a day (BID) | ORAL | Status: DC

## 2010-03-26 MED ORDER — CYCLOBENZAPRINE HCL 5 MG PO TABS: 5.0000 mg | ORAL_TABLET | Freq: Three times a day (TID) | ORAL | Status: DC | PRN

## 2010-03-26 NOTE — Progress Notes (Signed)
  Subjective:    [name] is a 45 y.o. female who presents for evaluation of low back pain. The patient has had no prior back problems. Symptoms have been present for 2 weeks and are gradually improving.  Onset was related to / precipitated by no known injury. The pain is located in the across the lower back and does not radiate. The pain is described as soreness and stiffness and occurs all day. She rates her pain as severe. Symptoms are exacerbated by coughing, flexion, lying down, sitting and standing. Symptoms are improved by NSAIDs. She has also tried NSAIDs which provided no symptom relief. She has no other symptoms associated with the back pain. The patient has no "red flag" history indicative of complicated back pain. Pt has been taking 8 advil TID for the last 2 weeks.  SHe is having some GI upset.  The following portions of the patient's history were reviewed and updated as appropriate: allergies, current medications, past family history, past medical history, past social history, past surgical history and problem list.  Review of Systems Constitutional: positive for fatigue, fevers and malaise, negative for anorexia and weight loss Respiratory: positive for asthma, cough and wheezing, negative for hemoptysis and pneumonia    Objective:   Inspection and palpation: inspection of back is normal, paraspinal tenderness noted at level of illiac crest. Muscle tone and ROM exam: muscle tone normal without spasm, full range of motion with pain. Neurological: normal DTRs, muscle strength and reflexes.    Assessment:    Nonspecific acute low back pain    Plan:    Natural history and expected course discussed. Questions answered. Regular aerobic and trunk strengthening exercises discussed. Heat to affected area as needed for local pain relief. Muscle relaxants per medication orders. Follow-up in 1 month.

## 2010-03-26 NOTE — Patient Instructions (Signed)
For your back pain I know it is hard but you need to stay active Walk as much as you can without too much pain-if right now that is 20 feet, then start there Please try heat for 20 min, three times a day I am giving you 20 vicodin, these are going to last through this pain--so only use them when you have to Come back and see me in one month or if you are feeling completely better, come back as needed

## 2010-03-26 NOTE — Assessment & Plan Note (Signed)
No red flags today.  Will try flexiril, some limited amount of vicodin (20 tabs) for this.

## 2010-03-26 NOTE — Assessment & Plan Note (Signed)
Has persistent cough x 2 weeks that is contributing to back pain.  Try tessalon perles today.

## 2010-04-01 ENCOUNTER — Telehealth: Payer: Self-pay | Admitting: Family Medicine

## 2010-04-01 NOTE — Telephone Encounter (Signed)
Pt was given a muscle relaxer, started out with 1 but now has to double the dose, pt only has 3 left & wants to know if MD will refill and increase to 10 mg? [facility]

## 2010-04-02 ENCOUNTER — Other Ambulatory Visit: Payer: Self-pay | Admitting: Family Medicine

## 2010-04-02 DIAGNOSIS — M549 Dorsalgia, unspecified: Secondary | ICD-10-CM

## 2010-04-02 MED ORDER — CYCLOBENZAPRINE HCL 10 MG PO TABS: 10.0000 mg | ORAL_TABLET | Freq: Two times a day (BID) | ORAL | Status: DC | PRN

## 2010-04-02 NOTE — Telephone Encounter (Addendum)
Will forward message to Dr. Hulen Luster.

## 2010-04-02 NOTE — Telephone Encounter (Signed)
Will refill flexiril.  Pt should come back to clinic if still needing pain meds.

## 2010-04-02 NOTE — Telephone Encounter (Signed)
[name] calling to say she has taken her last muscle relaxer and is not needing a new refill because she is still having severe pain.  Have only 4 of her Hydrocodone left and will be out completely tomorrow.  [facility] [facility] on her Flexeril and Hydrocodone.  Have to take two of the muscle relaxers to get relief

## 2010-04-03 NOTE — Telephone Encounter (Signed)
Patient notified  And she will call for appointment.

## 2010-04-06 ENCOUNTER — Encounter: Payer: Self-pay | Admitting: Family Medicine

## 2010-04-06 ENCOUNTER — Ambulatory Visit (INDEPENDENT_AMBULATORY_CARE_PROVIDER_SITE_OTHER): Payer: Medicaid Other | Admitting: Family Medicine

## 2010-04-06 VITALS — BP 176/121 | HR 124 | Temp 98.1°F | Ht 63.5 in | Wt 379.0 lb

## 2010-04-06 DIAGNOSIS — M549 Dorsalgia, unspecified: Secondary | ICD-10-CM

## 2010-04-06 MED ORDER — HYDROCODONE-ACETAMINOPHEN 7.5-500 MG PO TABS: 1.0000 | ORAL_TABLET | Freq: Three times a day (TID) | ORAL | Status: DC | PRN

## 2010-04-06 NOTE — Progress Notes (Signed)
  Subjective:    [name] is a 45 y.o. female who presents for reevaluation of low back pain. The patient has had no prior back problems. Symptoms have been present for 4 weeks and are gradually improving.  Onset was related to / precipitated by no known injury. The pain is located in the across the lower back and radiates down the back of her left leg. The pain is described as soreness and stiffness and occurs all day. She rates her pain as severe. Symptoms are exacerbated by coughing, flexion, lying down, sitting and standing. Symptoms are improved by Vicoden. She has also tried NSAIDs which provided no symptom relief. She has no other symptoms associated with the back pain. The patient has no "red flag" history indicative of complicated back pain.  She was seen in clinic last week for the same complaint and diagnosed with nonspecific low back pain.  She was supposed to follow up for evaluation of whether she still needs narcotics.    The following portions of the patient's history were reviewed and updated as appropriate: allergies, current medications, past family history, past medical history, past social history, past surgical history and problem list.  Review of Systems Constitutional: positive for fatigue, fevers and malaise, negative for anorexia and weight loss Respiratory: positive for asthma, cough and wheezing, negative for hemoptysis and pneumonia    Objective:     Inspection and palpation: inspection of back is normal, paraspinal tenderness noted at level of illiac crest. Muscle tone and ROM exam: muscle tone normal without spasm, full range of motion with pain. Neurological: normal DTRs, muscle strength and reflexes. Back: negative SLR bilaterally. General: morbidly obese    Assessment:    Nonspecific acute low back pain    Plan:    Natural history and expected course discussed. Questions answered. Regular aerobic and trunk strengthening exercises discussed. Heat  to affected area as needed for local pain relief. Muscle relaxants per medication orders. Follow-up in 1 month.   HPI   Review of Systems   Physical Exam

## 2010-04-06 NOTE — Assessment & Plan Note (Signed)
Acute back pain persists.  No red flags.  Denies saddle anesthesia, loss of bowel/bladder function.  Will continue with conservative management.  Control pain with Vicodin.

## 2010-04-06 NOTE — Patient Instructions (Signed)
It was nice to meet you today I can understand why you are upset I will refill your pain medicines and hopefully give you enough to get over this pain Please schedule a follow up appointment with Dr. Hulen Luster in 4-6 weeks to follow up on your pain

## 2010-04-07 ENCOUNTER — Telehealth: Payer: Self-pay | Admitting: Family Medicine

## 2010-04-07 NOTE — Telephone Encounter (Signed)
Spoke with [name].  She would like to change Drs because did not feel her care by Dr Hulen Luster was appropriate on 3 different occassions 1.OV with Asthma symptoms did not get abx 2. Called in with asthma symptoms want prednisone called in without a visit.  Was told that she should be seen 3. Was not called in more Vicodin for back pain after given #20.  I reviewed all three cases and agree with the medical decision making by Dr Hulen Luster.  I informed Sara Blossom of this.  She would still like to change doctors because not comfortable with Dr  Hulen Luster. I informed her we allow pts to change providers once.  Will change to Dr Bluford Kaufmann Merlinda Frederick L

## 2010-04-07 NOTE — Telephone Encounter (Signed)
Message copied by Oda Cogan on Tue Apr 07, 2010 10:57 AM ------      Message from: Charm Rings      Created: Mon Apr 06, 2010  5:54 PM      Contact: [phone]                   ----- Message -----         From: Knox Royalty         Sent: 04/06/2010  12:39 PM           To: Adalberto Cole, RN            Pt is asking to switch providers, she said she & Dr. Hulen Luster are not compatible.

## 2010-04-15 LAB — BASIC METABOLIC PANEL
BUN: 5 mg/dL — ABNORMAL LOW (ref 6–23)
Calcium: 8.6 mg/dL (ref 8.4–10.5)
Creatinine, Ser: 0.75 mg/dL (ref 0.4–1.2)
GFR calc non Af Amer: >60 mL/min (ref >60)

## 2010-04-15 LAB — DIFFERENTIAL
Eosinophils Absolute: 0 10*3/uL (ref 0.0–0.7)
Lymphocytes Relative: 7 % — ABNORMAL LOW (ref 12–46)
Lymphs Abs: 0.7 10*3/uL (ref 0.7–4.0)
Neutrophils Relative %: 84 % — ABNORMAL HIGH (ref 43–77)

## 2010-04-15 LAB — URINE MICROSCOPIC-ADD ON

## 2010-04-15 LAB — URINALYSIS, ROUTINE W REFLEX MICROSCOPIC
Ketones, ur: NEGATIVE mg/dL
Leukocytes, UA: NEGATIVE
Nitrite: NEGATIVE
Specific Gravity, Urine: 1.025 (ref 1.005–1.030)
pH: 6 (ref 5.0–8.0)

## 2010-04-15 LAB — POCT PREGNANCY, URINE: Preg Test, Ur: NEGATIVE

## 2010-04-15 LAB — CBC
Platelets: 257 10*3/uL (ref 150–400)
WBC: 9.6 10*3/uL (ref 4.0–10.5)

## 2010-05-02 LAB — POCT I-STAT, CHEM 8
Calcium, Ion: 1.17 mmol/L (ref 1.12–1.32)
Creatinine, Ser: 0.8 mg/dL (ref 0.4–1.2)
Glucose, Bld: 95 mg/dL (ref 70–99)
Hemoglobin: 13.3 g/dL (ref 12.0–15.0)
TCO2: 25 mmol/L (ref 0–100)

## 2010-05-02 LAB — BASIC METABOLIC PANEL
BUN: 4 mg/dL — ABNORMAL LOW (ref 6–23)
CO2: 26 mEq/L (ref 19–32)
Calcium: 9.4 mg/dL (ref 8.4–10.5)
GFR calc non Af Amer: >60 mL/min (ref >60)
Glucose, Bld: 220 mg/dL — ABNORMAL HIGH (ref 70–99)

## 2010-05-02 LAB — CBC
HCT: 38.2 % (ref 36.0–46.0)
Hemoglobin: 12.4 g/dL (ref 12.0–15.0)
MCHC: 32.5 g/dL (ref 30.0–36.0)
MCHC: 33.4 g/dL (ref 30.0–36.0)
MCV: 83 fL (ref 78.0–100.0)
Platelets: 253 10*3/uL (ref 150–400)
RBC: 4.61 MIL/uL (ref 3.87–5.11)
RDW: 16.5 % — ABNORMAL HIGH (ref 11.5–15.5)

## 2010-05-02 LAB — DIFFERENTIAL
Basophils Relative: 0 % (ref 0–1)
Eosinophils Absolute: 0.6 10*3/uL (ref 0.0–0.7)
Eosinophils Relative: 6 % — ABNORMAL HIGH (ref 0–5)
Monocytes Absolute: 0.6 10*3/uL (ref 0.1–1.0)
Monocytes Relative: 6 % (ref 3–12)
Neutrophils Relative %: 76 % (ref 43–77)

## 2010-05-03 LAB — COMPREHENSIVE METABOLIC PANEL
ALT: 12 U/L (ref 0–35)
AST: 13 U/L (ref 0–37)
Albumin: 3.3 g/dL — ABNORMAL LOW (ref 3.5–5.2)
CO2: 26 mEq/L (ref 19–32)
Calcium: 9.2 mg/dL (ref 8.4–10.5)
Chloride: 108 mEq/L (ref 96–112)
Creatinine, Ser: 0.78 mg/dL (ref 0.4–1.2)
GFR calc Af Amer: >60 mL/min (ref >60)
GFR calc non Af Amer: >60 mL/min (ref >60)
Sodium: 141 mEq/L (ref 135–145)

## 2010-05-03 LAB — CBC
MCHC: 32.9 g/dL (ref 30.0–36.0)
Platelets: 288 10*3/uL (ref 150–400)
RBC: 4.78 MIL/uL (ref 3.87–5.11)
WBC: 12.6 10*3/uL — ABNORMAL HIGH (ref 4.0–10.5)

## 2010-05-03 LAB — DIFFERENTIAL
Eosinophils Absolute: 0.5 10*3/uL (ref 0.0–0.7)
Eosinophils Relative: 4 % (ref 0–5)
Lymphocytes Relative: 16 % (ref 12–46)
Lymphs Abs: 2.1 10*3/uL (ref 0.7–4.0)
Monocytes Absolute: 0.7 10*3/uL (ref 0.1–1.0)

## 2010-05-28 ENCOUNTER — Ambulatory Visit (INDEPENDENT_AMBULATORY_CARE_PROVIDER_SITE_OTHER): Payer: Medicaid Other | Admitting: Family Medicine

## 2010-05-28 VITALS — BP 188/118

## 2010-05-28 DIAGNOSIS — M25569 Pain in unspecified knee: Secondary | ICD-10-CM

## 2010-05-28 DIAGNOSIS — M549 Dorsalgia, unspecified: Secondary | ICD-10-CM

## 2010-05-28 DIAGNOSIS — M25562 Pain in left knee: Secondary | ICD-10-CM | POA: Insufficient documentation

## 2010-05-28 DIAGNOSIS — M1710 Unilateral primary osteoarthritis, unspecified knee: Secondary | ICD-10-CM

## 2010-05-28 DIAGNOSIS — M25561 Pain in right knee: Secondary | ICD-10-CM

## 2010-05-28 DIAGNOSIS — G8929 Other chronic pain: Secondary | ICD-10-CM | POA: Insufficient documentation

## 2010-05-28 DIAGNOSIS — M179 Osteoarthritis of knee, unspecified: Secondary | ICD-10-CM | POA: Insufficient documentation

## 2010-05-28 DIAGNOSIS — IMO0002 Reserved for concepts with insufficient information to code with codable children: Secondary | ICD-10-CM

## 2010-05-28 MED ORDER — HYDROCODONE-ACETAMINOPHEN 7.5-500 MG PO TABS: 1.0000 | ORAL_TABLET | Freq: Three times a day (TID) | ORAL | Status: DC | PRN

## 2010-05-28 NOTE — Assessment & Plan Note (Signed)
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 Morbid obesity also contributing to her symptoms. - Emphasized need for weight loss to help with her pain - Patient underwent bilateral intra-articular knee injections in the office today PROCEDURE NOTE: Consent obtained and verified. Area cleansed with alcohol. Topical analgesic spray: Ethyl chloride. Joint: Left and right knees Approached in typical fashion with: Anterior medial approach Completed without difficulty Meds: 4 cc 1% lidocaine + 1 cc Kenalog 40 mg/cc injected into each knee Needle:21-gauge 1.5 [facility] Aftercare instructions and Red flags advised. - Refill on Vicodin given, encouraged to only take on an as-needed basis and primarily at bedtime - Followup on an as-needed basis

## 2010-05-28 NOTE — Progress Notes (Signed)
  Subjective:    Patient ID: [name], female    DOB: 05-May-1965, 45 y.o.   MRN: [id number]  HPI 45 year old morbidly obese female to office with complaint of bilateral knee pain that has been ongoing for the past several years. X-rays in 2010 which were non-standing films showed moderate to severe arthritis in both knees. Last steroid injection was 12/2009 which was helpful. She has been taking Vicodin 2-3 times a day to help with her pain. Left knee has been more bothersome over the last 2-3 months. Has associated swelling in the knees along with some intermittent clicking and catching. Has stiffness after being seated for long periods of time. Denies any recent injury or trauma.   Review of Systems Per history of present illness, otherwise negative    Objective:   Physical Exam GENERAL: Alert and oriented x3, in no acute distress, morbidly obese MSK: -  Knees: Bilateral knees with range of motion 0-115 with significant pain. Mild surrounding soft tissue swelling, difficult to assess if she has an effusion due to her body habitus. Significantly tender to palpation along the medial and lateral joint lines on the left, mildly tender to palpation along the medial and lateral joint lines on the right. Fullness of popliteal fossa bilaterally consistent with Baker's cyst. No surrounding erythema or ecchymosis. He does have discomfort with McMurray's bilaterally. No ligamentous laxity. She has no calf tenderness. - Gait: Walking with antalgic gait Neurovascular intact distally       Assessment & Plan:

## 2010-05-28 NOTE — Assessment & Plan Note (Signed)
Morbid obesity is likely contributing to her DJD and knee pain. Emphasized weight loss to help with her knee symptoms.

## 2010-05-28 NOTE — Assessment & Plan Note (Signed)
Bilateral knee DJD contributing to her pain.

## 2010-06-09 NOTE — Procedures (Signed)
[name], [name]              ACCOUNT NO.:  [id number]   MEDICAL RECORD NO.:  [id number]          PATIENT TYPE:  OUT   LOCATION:  SLEEP CENTER                 FACILITY:  [facility]   PHYSICIAN:  Clinton D. Maple Hudson, MD, FCCP, FACPDATE OF BIRTH:  July 22, 1966   DATE OF STUDY:  11/18/2007                            NOCTURNAL POLYSOMNOGRAM   REFERRING PHYSICIAN:  Lupita Raider, M.D.   INDICATION FOR STUDY:  Hypersomnia with sleep apnea.   EPWORTH SLEEPINESS SCORE:  Epworth sleepiness score 14/24.  BMI 72.6.  Weight 410 pounds.  Height 63 inches.  Neck 18.5 inches.   HOME MEDICATION:  Charted and reviewed.  Note that she states after a  sleep study in the 1990s she has had BiPAP at home at unknown pressure.  She uses oxygen at 2 L per minute continuously and at night bleeds it  into her BiPAP machine.  Compliance is unknown.   SLEEP ARCHITECTURE:  Diagnostic NPSG study.  Total sleep time 238  minutes with sleep efficiency 66.2%.  Stage I was 7.4%.  Stage II 92.5%.  Stage III and REM were absent.  Sleep latency 0, indicating immediate  sleep onset with lights out.  REM latency NA.  Awake after sleep onset  35 minutes.  Arousal index 15.6.  No bedtime medication was taken.   RESPIRATORY DATA:  Apnea-hypopnea index (AHI) 3.8 per hour.  A total of  15 events were scored, all obstructive apneas.  She only slept on her  sides and all of them toward that position.  Respiratory disturbance  index (RDI) 9.8 reflecting a respiratory event with related arousals of  24, index 6.1 per hour.  These were events not meeting full diagnostic  criteria as apneas or hypopneas.   OXYGEN DATA:  Loud snoring with oxygen desaturation to a nadir of 93%.  Wearing oxygen through the study adjusted by the technician to 1 L per  minute.  Mean oxygen saturation on 1 L oxygen was 97.6%.   CARDIAC DATA:  Sinus tachycardia, 97 per minute.   MOVEMENT/PARASOMNIA:  Occasional limb jerk with insignificant effect on  sleep.  No bathroom trips.   IMPRESSION/RECOMMENDATION:  1. Sleep architecture was significant for absence of rapid eye      movement sleep and for final awakening around 3.45 a.m.  2. Occasional respiratory events effecting sleep, apnea-hypopnea index      3.8 per hour (normal range 0-5 per hour).  Loud snoring with oxygen      desaturation to a nadir of 93% while on supplemental oxygen.  3. The patient uses oxygen 2 L per minute continuously.  The      technician continued oxygen, but turned it down to 1 L per minute      to make recognition of desaturation events easier.  Significant      desaturation was not seen.      Clinton D. Maple Hudson, MD, [facility], FACP  Diplomate, Biomedical engineer of Sleep Medicine  Electronically Signed     CDY/MEDQ  D:  11/25/2007 12:02:51  T:  11/26/2007 00:05:49  Job:  [id number]

## 2010-06-09 NOTE — H&P (Signed)
[name], [name]              ACCOUNT NO.:  [id number]   MEDICAL RECORD NO.:  [id number]          PATIENT TYPE:  INP   LOCATION:  3730                         FACILITY:  [facility]   PHYSICIAN:  Paula Compton, MD        DATE OF BIRTH:  1965-03-15   DATE OF ADMISSION:  09/28/2007  DATE OF DISCHARGE:                              HISTORY & PHYSICAL   CHIEF COMPLAINTS:  Worsening dyspnea.   PRIMARY CARE PHYSICIAN:  Lupita Raider, MD   HISTORY OF PRESENT ILLNESS:  This is a 45 year old morbidly obese  African American female with a history of asthma and dormant myasthenia  gravis who presents for followup on shortness of breath.  She was seen 2  days ago and reported cough and progressive dyspnea on exertion for 6-8  weeks with acute worsening 2 days prior to that.  She complained of body  aches and chills that started the day of that visit 2 days ago.  Her  pulse ox at that time was 92% on room air.  CBC was normal with a white  blood cell count of 10 and a BNP of 28 with a normal TSH.  Chest x-ray  unfortunately was abnormal with bilateral patchy almost nodular-  appearing infiltrates with a differential diagnosis including multifocal  pneumonia, pulmonary malignancy, or congestive heart failure.   Today, she reports the body aches and chills are gone but still with  dyspnea on exertion with a feeling of trying to breathe underwater.  She has a productive cough.  She has been taking the azithromycin for 2  days now, although she reports she did not take it this morning because  she forgot.  Albuterol does not help the dyspnea on exertion.   She denies chest pain other than a soreness, when she coughs.  She does  report some fairly significant post tussive emesis but no nausea outside  of coughing.  She denies any leg swelling or weight gains although they  are ordinary for her.   past medical history is significant for:  1. 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 Sharene Skeans of [facility].  2. Morbid obesity.  3. Diagnosis of asthma but no history of documented PFTs.  4. Hypertension.   PAST SURGICAL HISTORY:  C-section in 2006 and thymus resection in 1995.   ALLERGIES:  No known drug allergies.   MEDICATIONS:  1. Proventil inhaler 1-2 puffs q.4 h. p.r.n.  2. Norvasc 10 mg p.o. daily.  3. Protonix 40 mg 1 tablet p.o. b.i.d.  4. Voltaren 75 mg 1 tablet b.i.d.  5. Fexofenadine 180 mg p.o. nightly.  6. Zestoretic 20/25 one tablet daily.  7. Ditropan 5 mg p.o. b.i.d.  8. Advair Diskus 500/50 one puff b.i.d. although the patient reports      not having taken this for many months.  9. Singulair 10 mg p.o. daily.  10.Cymbalta 60 mg daily.  11.Azithromycin 500 mg 1 daily, starting on September 26, 2007.  12.Multivitamins 1 daily.   Review of systems is negative except  for the above.   FAMILY HISTORY:  Hypertension in her father and grandparents.  Mother  with polysubstance abuse.   SOCIAL HISTORY:  She lives in a fairly squalid conditions in a house  with her husband who was recently incarcerated and released  approximately 2 years ago, a daughter, 2 sons, and grandsons.  She is  unemployed and currently on disability.  She has a motorized scooter  that she uses periodically and is attempting to go back to the school  for a graduate degree.  She does not smoke, denies alcohol or illicit,  but her husband does smoke in the house.  She does not exercise and does  not follow any routine diet.   PHYSICAL EXAMINATION:  VITAL SIGNS:  O2 sat 95% on room air, drops to  90% with walking.  Temperature 98.2, pulse 106, respirations 26, blood  pressure is 143/79.  She is 390 pounds with a BMI of 68.  Height is 63.5  inches.  GENERAL:  This is a morbidly obese, pleasant, African American female  who is able to speak in full sentences but does get very winded when  moving.  She is in no apparent distress.   HEENT:  Pupils equal, round, and reactive to light.  Extraocular  movements are intact.  Normocephalic and atraumatic.  Moist mucous  membranes and no JVD.  NECK:  Supple.  LUNGS:  Normal respiratory effort but obvious shortness of breath on  walking.  She interestingly has no wheezes or crackles and is clear  except for some decreased breath sounds at the base, which is likely due  to her body habitus only.  CARDIOVASCULAR:  Regular rate and rhythm.  No murmurs, rubs, or gallops.  ABDOMEN:  Obese and positive bowel sounds.  Pulses 2+ radial and pedal pulses bilaterally.  EXTREMITIES:  Trace bilateral edema that is nonpitting and baseline per  the patient.  NEUROLOGIC:  Alert and oriented x3 with a normal gait.  Slight ptosis of  the right eyelid that is the baseline per the patient.  Moving all  extremities, otherwise.  SKIN:  Normal turgor.  Good color.  No rashes.  She does have a well-  healed thymectomy scar on anterior chest.  PSYCH:  Good eye contact and normally interactive.   LABORATORY AND CHEST X-RAY:  None today.   ASSESSMENT AND PLAN:  This is a 45 year old African American female with  morbid obesity, myasthenia gravis that is dormant, and a history of  asthma who presents with worsening dyspnea on exertion and a concerning  chest x-ray.  By problems:  1. Dyspnea on exertion:  The patient has a history of dormant      myasthenia but does appear rather tired and I am concerned she is      going to tucker out.  She has not improved on azithromycin from a      respiratory standpoint, but she is now without the aches and fever.      We will admit her for telemetry for observation and further testing      of her lungs and continuous pulse ox.  We will get an ABG, repeat      her chest x-ray and CT, and repeat a CBC along with obtaining an      H1N1 as well.   Differential diagnosis includes focal versus atypical pneumonia versus  malignancy versus congestive heart failure  (we will check an echo).  Infectious source is most likely.  Lower  on the list would be PCP, but I  would expect her to be more hypoxic.  We will check an HIV just to be  sure.  Continue azithromycin and started her on Rocephin.  Nebs as  needed, though these are not seeming to be helping her much.  We will  follow on telemetry and continue pulse ox.  O2 as needed.  She admits to  not using her Advair lately and will not continue until etiology is more  clear.  1. Myasthenia gravis is currently dormant.  We will continue to follow      her symptomatically.  2. Hypertension, stable.  We will hold her blood pressures for now      until we further workup.  3. Gastroesophageal reflux disease:  Continue her on her proton pump      inhibitor.  4. Depressive disorder:  Continue her on Cymbalta.  5. Asthma:  I do not think this dyspnea on exertion is related to the      asthma, thus we will hold her medicines currently since she is not      really taking them any way.  6. Prophylactic proton pump inhibitor and Lovenox.   DISPOSITION:  Pending #1.      Lupita Raider, M.D.  Electronically Signed      Paula Compton, MD  Electronically Signed    KS/MEDQ  D:  09/28/2007  T:  09/29/2007  Job:  [id number]

## 2010-06-09 NOTE — Discharge Summary (Signed)
NAMEZEYNA, MKRTCHYAN              ACCOUNT NO.:  [id number]   MEDICAL RECORD NO.:  [id number]          PATIENT TYPE:  INP   LOCATION:  6727                         FACILITY:  [facility]   PHYSICIAN:  Pearlean Brownie, M.D.DATE OF BIRTH:  1965/12/18   DATE OF ADMISSION:  09/28/2007  DATE OF DISCHARGE:  10/03/2007                               DISCHARGE SUMMARY   DISCHARGE DIAGNOSES:  1. Interstitial lung disease, inflammatory type, unspecified.  2. Hypertension.  3. Asthma.  4. Probable obstructive sleep apnea.  5. Morbid obesity.  6. Hypokalemia.  7. Gastroesophageal reflux disease.  8. Depression.  9. History of myasthenia gravis (dormant).  10.Osteoarthritis   CONSULTANT:  Pulmonary.   PROCEDURE/STUDIES:  1. Bronchoscopy.  Awaiting pathology results.  2. Echocardiogram.  Ejection fraction of 60%.  Limited study which      shows mild calcification of aortic valve.  3. Chest x-ray September 28, 2007.  Impression:  Scattered      consolidative foci, questionable organizing pneumonia such as BOOP.  4. Chest x-ray October 01, 2007.  Impression:  Worsening aeration to      right lung base, persistent bilateral multifocal air space      densities.  5. CT angiography of the chest.  Impression:  No evidence of PE,      bilateral nodular air space densities with right peribronchial      thickening suggesting multifocal organizing pneumonia.   DISCHARGE LABORATORY DATA:  BMP shows sodium 139, potassium 3.3,  chloride of 105, bicarb 28, BUN 8, creatinine 0.89, glucose 129, calcium  8.8.  CBC shows  white blood cell 14,  hemoglobin 11.8, hematocrit 35.5,  platelets 270,000.  Serum ACE within normal limits.  PPD negative.  ANA  positive, HIV negative, strep pneumonia negative.  Urine Legionella  negative.  TSH within normal limits.  ESR elevated at 60.  BNP less than  30 within normal limits.  Fungal culture/smear negative.  AFB negative.  Sputum culture negative.  Sputum pathology smear  showed alveolar  macrophages, a few hyperplastic respiratory epithelial cells, and rare  group of small cells with high nuclear cytoplastic ratio which are  probably reactive cells.  Bronchoscopy pathology pending.  Magnesium  level 2.3 within normal limits.   BRIEF HISTORY:  A 45 year old, morbidly obese female with history of  asthma, presents with 6-week history of cough, dyspnea on exertion and  concerning chest x-ray.   HOSPITAL COURSE:  1. Interstitial lung disease, inflammatory type, unspecified versus      BOOP.  Chest x-ray and CT concerning for infection versus      inflammatory process.  Pulmonary was consulted early on during      admission.  Infectious process ruled out as the patient remained      afebrile with normal WBC.  PPD negative for TB.  Cultures for      pneumonia negative.  An increased ESR suggested inflammatory      process especially in light of negative infectious work-up.  The      patient did receive Rocephin and azithromycin times one dose during  inpatient stay, and  two doses of azithromycin given as an      outpatient prior to admit.  Guaifenesin p.r.n. was given for cough.  The patient was started on prednisone 60 mg p.o. for inflammatory  process in the setting of interstitial lung disease versus cryptogenic  organizing pneumonia also known as BOOP.  Other differentials included  hypersensitivity pneumonitis, sarcoidosis, although ruled out as serum  ACE  normal and calcium normal.  Unlikely congestive heart failure or  acute reactive airway disease with chest x-ray findings. Echocardiogram  was done to rule out CHF as possible cause of dyspnea on exertion.  However study limited due to the patient's weight.  Ejection fraction  was shown to be 60% and BNP was within normal limits.  Clinically  respiratory exam did not show any evidence of CHF.  The patient's weight was a definite concern for obstructive sleep apnea.  BiPAP was started during  admission.  The patient to follow up outpatient  for formal sleep study in lieu of acute respiratory decompensation.  1. Hypertension.  The patient's blood pressure was stable; however,      initially systolic ranged from 110s to 180s.  The patient's BP meds      were initially held on admission and restarted to maintain adequate      hypertension control.  The patient will continue Zestoretic 20/25      mg and Norvasc 10 mg p.o. daily.  3.  Asthma.  No acute asthma      exacerbations during admission. The patient was continued on home      meds upon discharge. Acute lung disease, not typical of the      patient's asthma exacerbations in the past.  2. Osteoarthritis.  The patient has a history of arthritis.  Was      restarted on home medication, Voltaren.  Bariatric bed with air      mattress also ordered for support.  3. Hypokalemia.  The patient was found to be hypokalemic on admission      and again prior to discharge.  Magnesium was within normal limits.      Potassium was repleted during both instances and will follow up      with potassium upon discharge with PCP.  Longer potassium      supplement may be indicated.  4. GERD.  Protonix 40 mg continued.  5. Depression unchanged.  Cymbalta continued.   DISCHARGE MEDICATIONS:  1. Prednisone 60 mg 1 tablet daily.  2. Proventil HFA 1-2 puffs every 4 hours as needed.  3. Protonix 40 mg b.i.d.  4. Norvasc 10 mg daily  5. Voltaren 75 mg b.i.d.  6. Fexofenadine at bedtime.  7. Zestoretic 20/25 mg daily.  8. Ditropan 5 mg b.i.d.  9. Advair 500/50 one puff inhaled b.i.d.  10.Singulair 10 mg p.o. daily.  11.Cymbalta 30 mg b.i.d.  12.Multivitamin 1 tablet daily.   ISSUES FOR FOLLOWUP:  1. Formal evaluation of obstructive sleep apnea.  2. Pathology from bronchoscopy.  3. Final respiratory culture/smears.  4. Hypokalemia.   FOLLOW UP:  1. The patient is to follow up with primary care physician, Dr. Lianne Bushy [facility] on October 05, 2007 at 9:45 a.m.  2. The patient will follow up with Dr. Craige Cotta, in pulmonary in two to      three weeks.      Milinda Antis, MD  Electronically Signed      Pearlean Brownie,  M.D.  Electronically Signed    KD/MEDQ  D:  10/04/2007  T:  10/05/2007  Job:  [id number]   cc:   Coralyn Helling, MD  Lupita Raider, M.D.

## 2010-06-09 NOTE — H&P (Signed)
[name], [name]              ACCOUNT NO.:  [id number]   MEDICAL RECORD NO.:  [id number]          PATIENT TYPE:  OBV   LOCATION:  6713                         FACILITY:  [facility]   PHYSICIAN:  Santiago Bumpers. Hensel, M.D.DATE OF BIRTH:  [birth year]   DATE OF ADMISSION:  09/01/2008  DATE OF DISCHARGE:                              HISTORY & PHYSICAL   PRIMARY CARE PHYSICIAN:  Dr. Romero Belling, Family practice teaching  service.   CHIEF COMPLAINT:  Dyspnea.   HISTORY OF PRESENT ILLNESS:  This is a 45 year old female with past  medical history significant for asthma, obstructive sleep apnea, obesity  hypoventilation syndrome who presents with worsening dyspnea and  increasing inhaler requirement x2 weeks.  The patient states that she  was in her usual state of health up until 2 weeks ago at which time she  had a started having worsening dyspnea and increasing rescue inhaler  requirement.  The patient had been intermittently using her O2 at home  though she is prescribed 2 liters nasal cannula while at rest and 3  liters nasal cannula with activity.  The patient began using her O2 as  prescribed with her worsening dyspnea as described above.  Dyspnea  initially improved; however, approximately 1 week later, worsened.  The  patient's dyspnea continued to worsen up until day of admission.  The  patient states that she has been using her rescue inhaler up to every  hour without relief.  The patient reports cough with clear, occasionally  yellow phlegm without blood.  The patient reports subjective fevers,  chills, body aches.  The patient also reports emesis earlier this week  associated with coughing;  however, this has since resolved.  The  patient reports episodes of loose stools early this week which has also  resolved.  The patient reports episodes of headache earlier this week  which have resolved.  The patient denies sick contacts, sore throat,  runny nose, swollen lymph nodes,  dysuria, unilateral weakness, or chest  pain though the patient does report chest tightness with attempts at  deep breathing.  The patient has not been taking her medications at home  this week for fear of emesis.  The patient is prescribed BiPAP at night  but she has not been using this due to the fact that she finds it  uncomfortable.  The patient has not been using her home O2 as directed  as she states that the oxygen irritates her nose.  The patient reports  myalgias and generalized weakness earlier this week which have improved  substantially.   ALLERGIES:  No known drug allergies.   MEDICATIONS:  1. Symbicort 160/4.5 mcg 2 puffs inhaled b.i.d.  2. Singulair 10 mg p.o. daily  3. Proventil albuterol inhaler 1-2 puffs q.4 hours p.r.n. wheezing.  4. Nasonex 50 mcg 2 puffs intranasally once daily.  5. Zyrtec 10 mg p.o. daily  6. Norvasc 10 mg.  7. Zestoretic 20/12.5 mg 2 tablets p.o. daily  8. Omeprazole 20 mg p.o. daily  9. Ditropan 5 mg p.o. b.i.d.  10.Cymbalta 60 mg p.o. daily  11.Multivitamin 1 tablet p.o.  daily  12.Ultram 50 mg p.o. q.6 hours p.r.n. pain.   PAST MEDICAL HISTORY.:  1. Morbid obesity.  2. Asthma versus obesity hypoventilation syndrome.  PFTs in December      2009 showed FEV-1 of 76%, FVC of 66%.  3. Pulmonary infiltrates in August 2009 with question of BOOP followed      by Dr. Craige Cotta.  4. Allergic rhinitis.  5. Obstructive sleep apnea.  6. Myasthenia gravis diagnosed 1994, now with quiescent disease.  Seen      by Dr. Sharene Skeans.  No flares in many years.  7. Pelvic inflammatory disease.  8. Hypertension.  9. GERD.  10.Irritable bladder.  11.Depression.  12.Carpal tunnel syndrome.  13.Osteoarthritis, generalized.   PAST SURGICAL HISTORY:  1. C-section August 2006.  2. Thymus resection August 1995.  3. Bronchoscopy August 2009.   FAMILY HISTORY:  1. Mother polysubstance abuse.  2. Father has sickle cell trait, hypertension.  3. Maternal side:  Lupus, rheumatoid arthritis.  4. Paternal side:  Stroke, sickle cell trait, hypertension.  No known history of cervical, breast, ovarian, prostate cancer.   SOCIAL HISTORY:  The patient lives in house with husband, daughter, 2  sons and grandson.  The patient is unemployed.  The patient denies  tobacco, alcohol or drug use.  The patient is morbidly obese and does  not get any physical exercise.  The patient with husband who smokes in  house.   REVIEW OF SYSTEMS:  Review of systems is as per HPI.  Also positive for  osteoarthritis and carpal tunnel pain.   PHYSICAL EXAMINATION:  VITAL SIGNS: Heart rate 117, temperature 100.1,  respiratory rate 18, O2 sat 100% on 2 liters nasal cannula.  Blood  pressure 133/109.  GENERAL:  NAD, morbidly obese female, able to complete sentences easily,  nasal cannula O2.  HEAD: Normocephalic, atraumatic.  EYES:  No ptosis, PERRL, EOMI, no conjunctivitis.  NOSE:  No external deformities, no rhinorrhea, pink nasal mucosa.  MOUTH:  Moist mucous membranes, no erythema or exudate.  NECK:  No cervical lymphadenopathy, supple.  PULMONARY:  Diffuse inspiratory and expiratory wheezing with poor a air  movement bilaterally, no crackles on auscultation.  The patient able to  complete sentences easily.  CARDIOVASCULAR: Tachycardiac, regular rhythm though distant heart  sounds, no JVD, 2+ radial pulses.  Lower extremity edema difficult to  distinguish from obesity.  ABDOMEN: Obese, positive bowel sounds.  Nontender to palpation.  NEUROLOGC:  Cranial nerves II-XII intact.  Good strength in bilateral  upper extremities, bilateral lower extremities not tested for strength.  EXTREMITIES: No cyanosis noted, edema difficult to distinguish from  obesity.   LABS:  Sodium 141, potassium 3.5, chloride 106, BUN 3, creatinine 0.8,  glucose 95.  White blood cells 9.8, hemoglobin 13.3, hematocrit 39.0,  platelets 275.  Chest x-ray without acute process.   ASSESSMENT/PLAN:   This is an this is a 45 year old female past medical  history significant for asthma, obstructive sleep apnea, obesity  hypoventilation syndrome, history of interstitial lung disease who  presents with dyspnea likely secondary to asthma exacerbation.  1. Asthma exacerbation.  Patient back to baseline O2 requirement.      Will continue albuterol nebulizer q.4/q.2 hours p.r.n.  Patient      status post albuterol, Atrovent in ED.  Will restart home dose of      Symbicort, Singulair, and Zyrtec.  Will start prednisone 60 mg p.o.      daily x7 days.  We will not restart the patient  home dose of      Nasonex at this time for allergic rhinitis given the patient's      noncompliance with this medication in the outpatient setting.  It      seems that patient, per her history, was afraid of admission to the      hospital therefore had allowed her symptoms to deteriorate.      Patient also with some degree of noncompliance with her medication      regimen.  The patient also status post azithromycin x1 dose in the      ED.  Chest x-ray without acute changes.  We will discontinue      azithromycin for now.  The patient's symptoms likely secondary to      upper respiratory infection.  Will obtain blood cultures, repeat      chest x-ray if patient acutely decompensates.  2. Obstructive sleep apnea/obesity hypoventilation syndrome.  We will      prescribe BiPAP at night.  The patient has been noncompliant with      his at home.  Hopefully patient will used this during the course of      her hospitalization.  Pulmonary medications as above.  3. Hypertension.  Will continue Norvasc and Zestoretic at home doses.  4. Bladder spasm/bladder irritability.  Will continue home dose      Ditropan.  Problem is stable per the patient.  5. Depression.  This problem is stable for the patient.  Will continue      home dose of Cymbalta.  6. Chronic pain with osteoarthritis.  We will continue home dose of      Ultram.   7. Fluids, electrolytes, nutrition/Gastrointestinal:  Start Heart      Healthy diet, KVO IV fluids.  8. Prophylaxis.  We will start heparin 5000 units subcu t.i.d., will      start Protonix for home dose omeprazole.   DISPOSITION:  Pending improvement in respiratory status, tolerating home  dose of 2 liters O2 by nasal cannula, tolerating p.o. diet.      Bobby Rumpf, MD  Electronically Signed      Santiago Bumpers. [name], M.D.  Electronically Signed    KC/MEDQ  D:  09/02/2008  T:  09/02/2008  Job:  [id number]

## 2010-06-09 NOTE — Discharge Summary (Signed)
[name], [name]              ACCOUNT NO.:  [id number]   MEDICAL RECORD NO.:  [id number]          PATIENT TYPE:  OBV   LOCATION:  6713                         FACILITY:  [facility]   PHYSICIAN:  Santiago Bumpers. Hensel, M.D.DATE OF BIRTH:  02-11-1965   DATE OF ADMISSION:  09/01/2008  DATE OF DISCHARGE:  09/02/2008                               DISCHARGE SUMMARY   DISCHARGE DIAGNOSES:  1. Asthma exacerbation.  2. Obstructive sleep apnea.  3. Obesity hypoventilation syndrome.  4. Hypertension.  5. Bladder spasm.  6. Depression.  7. Chronic pain with osteoarthritis.   DISCHARGE MEDICATIONS:  1. Symbicort 2 puffs inhaled b.i.d. 160/4.5 mcg.  2. Singulair 10 mg p.o. daily.  3. Proventil 1-2 puffs q.4 h. p.r.n. wheezing.  4. Zyrtec 10 mg p.o. daily.  5. Norvasc 10 mg p.o. daily  6. Zestoretic 20/12.52 tablets p.o. daily  7. Omeprazole 20 mg p.o. daily  8. Ditropan 5 mg p.o. b.i.d.  9. Cymbalta 60 mg p.o. daily  10.Multivitamin daily.  11.Ultram 50 mg p.o. daily.  12.MiraLax 17 g p.o. daily.  13.Nasonex 50 mcg 2 puffs intranasally daily.  14.Prednisone 60 mg p.o. daily x4 days.   DISCHARGE LABS:  Please see admission history and physical for admission  labs as the patient was discharged on same day.   ADMISSION HOSPITAL STUDIES:  Chest x-ray on September 02, 2008 without acute  process.   BRIEF HOSPITAL COURSE:  This is a 45 year old female with past medical  history significant for asthma, obstructive sleep apnea, obesity  hypoventilation syndrome, history of interstitial lung disease who  presented with dyspnea, likely secondary to asthma exacerbation.   1. Asthma exacerbation.  The patient presented with 1-2 weeks history      of worsening dyspnea, however, at time of evaluation in the      emergency room had a ready return to her baseline O2 requirement,      which is 2 L nasal cannula.  The patient was started on albuterol,      Atrovent in the ED.  The patient was continued on  albuterol      nebulizer q.4/2 h. p.r.n..  The patient was continued on home dose      Symbicort, Singulair, Zyrtec.  The patient was started on      prednisone 60 mg p.o. daily x5 days total course.  The patient      quickly improved during course of hospitalization and was      discharged on same day of admission back at her baseline O2      saturations of her baseline O2 requirement of 2 L nasal cannula.      The patient had initially been treated with azithromycin x1 dose in      the ED; however, chest x-ray was without acute changes, thus      azithromycin was discontinued.  2. Obstructive sleep apnea/obesity hypoventilation syndrome.  The      patient with intermittent BiPAP use at home and night.  The patient      was started on BiPAP during course of hospitalization and will be  discharged on BiPAP for use at home at night.  3. Hypertension.  This problem was stable for the patient during      course of hospitalization and the patient will be discharged on her      home medications.  4. Bladder spasm.  This problem was stable during course of the      patient's hospitalization and the patient will be discharged on her      home medications.  5. Depression.  This problem is stable for the patient during course      of her hospitalization and she will be discharged on her home dose      of Cymbalta.  6. Chronic pain with osteoarthritis.  This problem was stable for the      patient during course of hospitalization and she will be discharged      on home dose of Ultram.   The patient was given instructions to increase activity slowly, no  instructions regarding diet, instructions to follow up with Dr. Rexene Alberts  at [facility] on September 04, 2008 at 1:30 p.m.  The  patient was discharged to home in stable and improved condition,  tolerating home dose of O2 by nasal cannula of 2 L and tolerating p.o.  diet and with improved respiratory status.      Bobby Rumpf, MD  Electronically Signed      Santiago Bumpers. [name], M.D.  Electronically Signed    KC/MEDQ  D:  09/16/2008  T:  09/17/2008  Job:  [id number]   cc:   Mahalia Longest

## 2010-06-09 NOTE — Consult Note (Signed)
[name], [name]              ACCOUNT NO.:  [id number]   MEDICAL RECORD NO.:  [id number]          PATIENT TYPE:  INP   LOCATION:  2906                         FACILITY:  [facility]   PHYSICIAN:  Felipa Evener, MD  DATE OF BIRTH:  1965/07/26   DATE OF CONSULTATION:  09/29/2007  DATE OF DISCHARGE:                                 CONSULTATION   REASON FOR CONSULTATION:  Diffuse pulmonary infiltrates.   CONSULTING PHYSICIAN:  Paula Compton, MD   HISTORY OF PRESENT ILLNESS:  This is a pleasant 45 year old morbidly  obese African American female who was admitted by the [facility]  Teaching Service with a chief complaint of progressive shortness of  breath.  Per the patient, she has had a slow progression of exertional  dyspnea estimated sometime over the last 6-8 weeks, superimposed on  chronic underlying exertional dyspnea.  She stated that it got acutely  worse approximately and first noticed on September 1 through September 27, 2007.  She was seen by primary care Kennth Vanbenschoten on September 27, 2007  with the above chief complaint.  She also reported generalized body  aches, occasional chills, and productive cough with clear to yellow-  colored sputum.  However, it was unclear as to how much of this was  acute versus chronic, noting some acuteness to the cough.  She reported  the sensation of chest fullness, and progressive orthopnea feeling as if  she was breathing underwater.  She received approximately 2 days of  outpatient azithromycin, returned to her primary care Lawayne Hartig with  persistence of the above symptoms and therefore was admitted for further  evaluation.  She had a CT scan done of her chest showing diffuse patchy  bilateral consolidative infiltrates for which we have been consulted  for.   PAST MEDICAL HISTORY:  Pelvic inflammatory disease, viral meningitis,  asthma, myasthenia gravis with intermittent weakness diagnosed in 1994,  and she had a thymectomy in 1995.   Treated in the past with Imuran and  Mestinon.  She had a C-section in 2006.   FAMILY HISTORY:  Significant for hypertension, and mother with  polysubstance abuse.   SOCIAL HISTORY:  She is currently unemployed, lives in a single storey  home.  She is status post incarceration.  She is married and has 2  children.  No pets, no recent travel.  Husband smokes outside of the  house.  Functionally, she is debilitated using a motorized scooter to  get about.  The heating system and cooling system in the house is  notable for having a air-conditioning unit that has been putting out  water, they note significant amount of moisture and mildew smell in the  house.  They also have multiple leaky passage, carpets that have a  mildewy-type odor.   ALLERGIES:  No known allergies.   CURRENT MEDICATIONS:  1. Proventil HFA p.r.n.  2. Norvasc 10 mg daily.  3. Protonix 40 twice daily.  4. Voltaren 75 mg twice a day.  5. Fexofenadine 180 mg at bedtime.  6. Lisinopril/hydrochlorothiazide 20/25 once daily.  7. Ditropan 5 mg twice a day.  8. Advair Diskus 500/50 one puff twice a day.  9. Singulair 10 mg once daily.  10.Cymbalta 30 mg twice a day.  11.Multivitamin capsule daily.   REVIEW OF SYSTEMS:  Reports her cough to be primarily notable when  recumbent.  She does have postnasal drip, subjective fever and chills,  body aches, worsening orthopnea.  However, this to some degree is not  much different than baseline.  She says her cough is better than  initially noted.  She does have 2 children at home from which they have  had allergy type symptoms.  She denies chest pain, but has had chest  soreness with cough and deep breath.   PHYSICAL EXAMINATION:  VITAL SIGNS:  Temperature 98.3, heart rate 101,  blood pressure 125/72, respirations 20-22, saturations 100% on 2 L nasal  cannula, and weight is 390 pounds.  GENERAL:  This is a morbidly obese African American female currently in  no acute  distress.  HEENT:  She is normocephalic.  Her mucous membranes are moist.  She has  no adenopathy to palpation.  NECK:  Large, no JVD appreciated.  PULMONARY:  Posterior rales noted, no accessory muscles.  CARDIAC:  Regular rate and rhythm.  EXTREMITIES:  Trace dependent edema, 2+ pulses, and warm to palpation.  ABDOMEN:  Obese, positive bowel sounds, no organomegaly.  NEUROLOGIC:  Intact.   LABORATORY DATA:  Sodium 140, potassium 3.1, chloride 104, CO2 29, BUN  6, creatinine 0.82, and glucose 100.  White blood cell count 8.7,  hemoglobin 12.3, hematocrit 36.8, and platelet count 309.  HIV was  nonreactive.  Echocardiogram obtained today demonstrates an intact  ejection fraction at 60%.   IMPRESSION AND PLAN:  Diffuse consolidative pulmonary infiltrates.  Differential diagnosis includes infective viral versus bacterial  etiology, hypersensitivity pneumonitis, organizing pneumonia, sarcoid  versus a malignant disease.  Doubt this is congestive heart failure.  No  doubt Ms. Odonohue needs diagnostic evaluations, i.e. bronchoscopy.  Given prior autoimmune disorder, certainly suspicious of underlying  sarcoid.  Given her body habitus, likely obesity hypoventilation  syndrome, and obstructive sleep apnea should be a very high risk for  diagnostic procedure.  Therefore, I have discussed with Ms. Cape the  logistics of formal diagnostics.  At this point, the most reasonable and  safe approach would be to bring Ms. Rocha to the Intensive Care, place  her on mechanical ventilation, stabilize the airway, and obtain  cultures, and diagnostic studies that way.  She may indeed require open  lung biopsy for further evaluation of the diffuse pulmonary infiltrates,  however, at this point, we would initiate workup with bronchoscopy.  She  will be at high risk for prolonged mechanical ventilation given her body  habitus.  She will be at high risk for failure to wean and high risk for  requirement  for tracheostomy given her  underlying obesity hypoventilation and obstructive sleep apnea  comorbidities.  She will likely require Thoracic Surgery evaluation,  however, again as mentioned will be at high risk for thoracic  intervention.   Thank you for allowing [place] to be part of her care.      Zenia Resides, NP      Marius Ditch Jefm Miles, MD  Electronically Signed    PB/MEDQ  D:  09/29/2007  T:  09/30/2007  Job:  [id number]

## 2010-06-09 NOTE — Discharge Summary (Signed)
[name], [name]              ACCOUNT NO.:  [id number]   MEDICAL RECORD NO.:  [id number]          PATIENT TYPE:  OIB   LOCATION:  1536                         [facility]ACILITY[facility]   PHYSICIAN:  Thomas A. Cornett, M.D.DATE OF BIRTH:  [birth year]   DATE OF ADMISSION:  07/30/2008  DATE OF DISCHARGE:  07/31/2008                               DISCHARGE SUMMARY   ADMISSION DIAGNOSES:  1. Symptomatic cholelithiasis.  2. Morbid obesity.  3. Hypertension.   DISCHARGE DIAGNOSES:  1. Symptomatic cholelithiasis.  2. Morbid obesity.  3. Hypertension.   PROCEDURE PERFORMED:  Laparoscopic cholecystectomy with cholangiogram.   BRIEF HISTORY:  The patient is a 45 year old female with symptomatic  cholelithiasis.  She was admitted after laparoscopic cholecystectomy due  to her morbid obesity and sleep apnea associated with that.   HOSPITAL COURSE:  Her course was unremarkable.  She was discharged home  on postoperative day #1 in satisfactory condition with good oxygen  saturations.  Vital signs were stable and benign abdomen post surgical  procedure.   DISCHARGE INSTRUCTIONS:  She will follow up with me in 4 weeks.  She  will be given a prescription for Percocet to take and will resume all of  her home medications as outlined in her medical reconciliation list  which is contained on the chart.   CONDITION ON DISCHARGE:  Improved.      Thomas A. Cornett, M.D.  Electronically Signed     TAC/MEDQ  D:  07/31/2008  T:  07/31/2008  Job:  [id number]

## 2010-06-09 NOTE — Letter (Signed)
May 28, 2008     RE:  AMIJAH, TIMOTHY  MRN:  [id number]  /  DOB:  [birth year]   To Whom It May Concern:   Ms. Mallet is under my care at [facility] for her sleep-  disordered breathing.  She has both obstructive sleep apnea as well as  nocturnal hypoventilation.  As a result, she requires the use of bilevel  positive airway pressure (BPAP) as continuous positive airway pressure  (CPAP) was ineffective for her condition.  If you have questions  regarding this, please feel free to call me at [phone].    Sincerely,      Coralyn Helling, MD  Electronically Signed    VS/MedQ  DD: 05/28/2008  DT: 05/29/2008  Job #: [id number]

## 2010-06-09 NOTE — Op Note (Signed)
[name], [name]              ACCOUNT NO.:  [id number]   MEDICAL RECORD NO.:  [id number]          PATIENT TYPE:  AMB   LOCATION:  DAY                          FACILITY:  [facility]   PHYSICIAN:  Thomas A. Cornett, M.D.DATE OF BIRTH:  30-Oct-1965   DATE OF PROCEDURE:  07/30/2008  DATE OF DISCHARGE:                               OPERATIVE REPORT   PREOPERATIVE DIAGNOSIS:  Cholelithiasis.   POSTOPERATIVE DIAGNOSIS:  Cholelithiasis.   PROCEDURE:  Laparoscopic cholecystectomy with cholangiogram.   ASSISTANT:  Currie Paris, M.D.   ANESTHESIA:  General endotracheal anesthesia with 0.25% bupivicaine  local.   ESTIMATED BLOOD LOSS:  20 mL.   SPECIMEN:  Gallbladder with stones.   INDICATIONS FOR PROCEDURE:  The patient is a 45 year old female with  morbid obesity and multiple medical problems who was found to have  symptomatic cholelithiasis.  She presents for laparoscopic  cholecystectomy for that.  The risks of the procedure were discussed  with the patient to include bleeding, infection, pulmonary issues given  her history of myasthenia gravis and obesity, common duct injury, injury  to neighboring structures, as well as blood clots.  She understands the  above and agrees to proceed.   DESCRIPTION OF PROCEDURE:  The patient was brought to the operating room  and placed supine.  After the induction of general anesthesia, the  abdomen was prepped and draped in sterile fashion.  A 1-cm  supraumbilical incision was made.  Dissection was carried down her  fascia.  Her fascia was opened in the midline and a pursestring suture  of 0-Vicryl was placed.  A 12-mm Hassan cannula was placed under direct  vision.  Pneumoperitoneum was created to 15 mmHg CO2, and the  laparoscope placed.  The patient was placed in reverse Trendelenburg and  rolled to her left.  An 11-mm subxiphoid port was placed under direct  vision.  Two 5-mm ports were placed in the right upper quadrant.  The  gallbladder identified, grabbed by its dome, and pushed toward the  patient's right shoulder.  Some omental adhesions were taken down with  cautery until the infundibulum was exposed.  We grabbed the infundibulum  and pulled it to the right lower quadrant.  This opened the triangle of  Calot.  We were then able to dissect out the cystic artery and cystic  duct entering the gallbladder and we achieved the critical angle.  Once  this was done, a clip was placed on the gallbladder side of the cystic  duct.  In the cystic duct a small incision was made a Cook cholangiogram  catheter was placed in it.  Cholangiogram revealed a long tortuous  cystic duct that flowed into the common bile duct with free flow of  contrast into the duodenum.  There was free flow of contrast into right  and left ducts.  There was no evidence of stone extravasation.  At this  point in time, we removed the catheter, triple clipped the cystic duct  and divided it.  The cystic artery was divided between clips and  divided.  Cautery was used to dissect the gallbladder  from the  gallbladder fossa without difficulty, and this was placed in an  EndoCatch bag.  The gallbladder bed was hemostatic upon inspection with  no signs of bleeding or bile leakage.  The stomach was somewhat  distended, and Anesthesia placed nasogastric and orogastric tubes for  that.  At this point time, we then placed the gallbladder into the  EndoCatch bag and extracted it out the umbilicus, closing the fascia  after extracting the gallbladder under direct vision with no signs of  problem.  The gallbladder bed was dry.  Excess irrigation was suctioned  out, and the 5-mm ports were then removed.  There were no signs of any  evidence of injury to any neighboring structures or organs.  The  incisions were closed with 4-0 Monocryl and Dermabond.  All final counts  of sponges, needles, and instruments were found to be correct in this  portion of the case.   The patient was awakened,  extubated, and taken to  the recovery room in satisfactory condition.      Thomas A. Cornett, M.D.  Electronically Signed     TAC/MEDQ  D:  07/30/2008  T:  07/30/2008  Job:  [id number]

## 2010-06-12 NOTE — Op Note (Signed)
NAME:  [name], [name] NO.:  [id number]   MEDICAL RECORD NO.:  [id number]          PATIENT TYPE:  INP   LOCATION:  [facility]                            FACILITY:  [facility]   PHYSICIAN:  Lesly Dukes, M.D. DATE OF BIRTH:  July 30, 1965   DATE OF PROCEDURE:  09/07/2004  DATE OF DISCHARGE:                                 OPERATIVE REPORT   PREOPERATIVE DIAGNOSIS:  A 45 year old gravida 4, para 3-0-0-3, at 34 weeks  and 6 days estimated gestational age with chronic hypertension, gestational  diabetes, extreme morbid obesity, absent end diastolic flow on umbilical  artery Doppler, and nonreassuring fetal heart tracing.   POSTOPERATIVE DIAGNOSIS:  A 45 year old gravida 4, para 3-0-0-3, at 34 weeks  and 6 days estimated gestational age with chronic hypertension, gestational  diabetes, extreme morbid obesity, absent end diastolic flow on umbilical  artery Doppler, and nonreassuring fetal heart tracing.   OPERATION/PROCEDURE:  Primary low flap transverse cesarean section.   SURGEON:  Lesly Dukes, M.D.   ASSISTANTMichele Mcalpine D. Okey Dupre, M.D. and Towana Badger, M.D.   ANESTHESIA:  Epidural.   PATHOLOGY:  Placenta.   ESTIMATED BLOOD LOSS:  800 mL.   COMPLICATIONS:  None.   FINDINGS:  Viable female infant born at 8:30 p.m., vertex presentation.  Apgars 8 at one minute and 9 at five minutes.  Weight equals 3 pounds 13  ounces.  Cord PH equals 7.24 from the umbilical artery.  Small placenta,  three-vessel cord, grossly normal uterus, ovaries and fallopian tubes.   DESCRIPTION OF PROCEDURE:  After informed consent was obtained, the patient  was taken to the operating room where epidural anesthesia was found to be  adequate.  The patient was placed in the dorsal supine position.  The Foley  was already in the bladder.  The patient was prepped and draped in the  normal sterile fashion and the patient was also placed in a leftward tilt.  A vertical skin incision was made with the scalpel  and carried down to the  fascia.  The fascia was incised in the midline and this incision was  extended both superiorly and inferiorly.  The rectus muscles were separated  in the midline.  The peritoneum was identified and entered sharply with the  Metzenbaum scissors and the incision was extended both superior and  inferiorly with good visualization of the bladder.  The bladder blade was  inserted.  The vesicouterine peritoneum was identified, tented up and  entered sharply with the Metzenbaum scissors.  __________ divided and the  bladder flap was created digitally.  The uterine incision was made and in  the transverse fashion in the lower uterine segment.  The incision was  extended bilaterally with the bandage scissors.  The baby's head was  delivered atraumatically without incident.  The rest of the baby's body  delivered easily.  The nose and mouth were suctioned.  The cord was clamped  and cut and the baby was handed off to the waiting pediatrician.  Cord blood  was sent for type and screen and umbilical Rh cord gas was also sent  with  the above findings.  Placenta delivered manually.  The uterus was cleared of  all clots and debris and closed in one layer with 0 Vicryl in a running  locked fashion and a second imbricating layer of 0 Monocryl.  Good  hemostasis was noted.  The peritoneum and bladder flap were noted to be  hemostatic.  The rectus muscles were hemostatic.  The fascia was hemostatic  and closed with looped 0 PDS in a running fashion.  Good hemostasis was  noted.  The subcutaneous tissue was copiously irrigated and noted to be  hemostatic.  A #10 Jackson-Pratt drain was placed and brought out through a  separate fascial incision and sewed in place with silk.  The subcutaneous  tissue was reapproximated with 0 plain.  The skin was closed with staples.  The patient tolerated the procedure well.  Sponge, lab, instrument, and  needle counts were x2.  The patient went to the  recovery room in stable  condition.           ______________________________  Lesly Dukes, M.D.     KHL/MEDQ  D:  09/07/2004  T:  09/08/2004  Job:  [id number]

## 2010-06-12 NOTE — Discharge Summary (Signed)
[facility]  Patient:    [name], [name]                     MRN: [id number] Adm. Date:  [id number] Disc. Date: 04/26/00 Attending:  Antionette Char Dictator:   Maryelizabeth Rowan, M.D. CC:         Dorcas Mcmurray, M.D.   Discharge Summary  DATE OF BIRTH:                12-08-1965  PRIMARY DIAGNOSIS:            Preeclampsia.  SECONDARY DIAGNOSIS:          Postpartum day #6 urinary tract infection.  HOSPITAL COURSE:              This 45 year old African-American female presented to the MAU complaining of increased swelling in the hands and feet as well as headache not relieved with Tylenol.  She was postpartum day #6 of normal spontaneous vaginal delivery that was complicated with preeclampsia. Upon arrival she was found to have elevated blood pressures, 150s-160s/80-90s. It was felt that the patient had postpartum preeclampsia.  She was admitted and given magnesium sulfate 2 g an hour until her symptoms resolved.  Review of systems also revealed UTI, signs of urinary frequency and urgency, and UA was positive for small leukocytes and many bacteria.  The patient was given IV Unasyn.  The patient responded well to this treatment.  Her headache symptoms resolved after two full days on magnesium sulfate.  She was also given Toradol for her headache pain as it was felt that she had sinusitis with tenderness above her sinuses and chronic congestion for over a week.  The patient was, therefore, also started on Augmentin, and she will be discharged home to complete a 10-day course of this therapy.  CONSULTATIONS:                None.  PROCEDURES:                   None.  SIGNIFICANT LABORATORY DATA:  CBC was within normal limits, hemoglobin stable at 10.7.  CMP was significant for low albumin, although it was stable at 2.6 during hospitalization, as well as increased alkaline phosphatase at 128, and increased LDH at 309.  The patient also had  increased uric acid levels at 8.0. Urinalysis significant for small leukocyte esterase and many bacteria.  Urine culture grew greater than 100,000 colonies of Proteus mirabilis sensitive to Augmentin.  DISPOSITION:                  Home.  CONDITION ON DISCHARGE:       Stable.  DISCHARGE MEDICATIONS:        1. Augmentin 875 mg b.i.d. x 8 days.                               2. Prenatal vitamins q.d.                               3. Motrin 600 mg every six hours as needed, or                                  Tylenol 500 mg every six hours as needed.  4. Instructed to not take her Zyrtec until she                                  has completed her course of Augmentin.  FOLLOW-UP:                    She will follow up with Dr. Wilma Flavin in four weeks for her regular postpartum visit. DD:  04/26/00 TD:  04/26/00 Job: [id number]

## 2010-06-12 NOTE — Discharge Summary (Signed)
NAMEJERNEE, [name] NO.:  [id number]   MEDICAL RECORD NO.:  [id number]          PATIENT TYPE:  WH   LOCATION:  WH                            FACILITY:  [facility]   PHYSICIAN:  Conni Elliot, M.D.DATE OF BIRTH:  [birth year]   DATE OF ADMISSION:  09/02/2004  DATE OF DISCHARGE:  09/09/2004                                 DISCHARGE SUMMARY   HOSPITAL COURSE:  The patient is a 45 year old G4, P3-0-0-3 who presented at  32-5/7 weeks with an increased blood pressure of 157/102.  The patient had a  chronic history of hypertension before her pregnancy and was being monitored  in the high risk teaching service of the clinic.  She has a history of  myasthenia gravis diagnosed in 1994.  A routine clinic ultrasound also  showed a decreased in fetal growth from the 75th percentile on a prior visit  to the 25th-40th percentile.  The patient was admitted primarily because of  a decline in fetal growth.  On arrival to the MAU the patient reported some  headaches with flashes of light, but denied any other visual changes.  The  patient was hemodynamically stable and afebrile on admission.  On hospital  day #0 the patient was followed for a 24-hour urine with continuous fetal  monitoring.  Blood magnesium was held due to her myasthenia gravis.  The  patient remained hypertensive through her hospital course but PIH labs and a  24-hour urine protein came back negative.  On hospital day #5 an umbilical  artery Doppler ultrasound was repeated and showed abnormal umbilical artery  Doppler wave forms with an absence of antegrade and diastolic flow.  The  decision was made at that time to induce the patient beginning with  insertion of Cervidil.  Just shortly after insertion of the Cervidil fetal  heart monitoring showed repetitive decelerations, which were deemed to be  nonreassuring.  The patient was taken to the OR for immediate low transverse  cesarean section with a vertical skin  incision.  Please see operative notes  for additional details on the procedure.  She had a routine hospital course  following the procedure.  The baby is in the NICU.  The mother is breast  feeding.  She will use Depo-Provera for contraception.  Her blood type is O  positive, Rubella immune.   DISCHARGE LABORATORIES:  Hematocrit 10.6, hemoglobin 32.3 dated September 08, 2004.   DISCHARGE INFORMATION:  Date of discharge September 10, 2004 at 11 a.m.   ACTIVITY:  Unrestricted.   DIET:  Routine.   MEDICATIONS:  1.  Cardizem LA 180 mg daily.  2.  Prenatal vitamins.  3.  Colace 100 mg daily.  4.  Percocet.  5.  Ibuprofen.   STATUS:  Well.   INSTRUCTIONS:  Routine.  Discharged to home.   FOLLOW UP:  In four days for staple removal at MAU.  Follow up in six weeks  for routine OB care at the high risk clinic.      Towana Badger, M.D.    ______________________________  Conni Elliot, M.D.  JP/MEDQ  D:  09/10/2004  T:  09/10/2004  Job:  [id number]

## 2010-06-12 NOTE — Consult Note (Signed)
NAMEKONSTANCE, [name]              ACCOUNT NO.:  [id number]   MEDICAL RECORD NO.:  [id number]          PATIENT TYPE:  INP   LOCATION:  [facility]                            FACILITY:  [facility]   PHYSICIAN:  Deanna Artis. Hickling, M.D.DATE OF BIRTH:  [birth year]   DATE OF CONSULTATION:  09/04/2004  DATE OF DISCHARGE:  09/02/2004                                   CONSULTATION   CHIEF COMPLAINT:  Preeclampsia and myasthenia gravis.   HISTORY OF PRESENT CONDITION:  [name] is a 45 year old gravida 4,  para 3-0-0-3 woman admitted to [facility] in a state of preeclampsia.  The patient has had chronic hypertension during her pregnancy and has been  monitored by the high-risk teaching service clinic.   The patient had a diagnosis of myasthenia gravis made in 1994. She had a  thymectomy in 1995 and had an evaluation by Dr. Clarisa Kindred of the  [facility] [place] in October of 1996. 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   The patient was last seen at [facility] by my partner,  Dr. Shireen Quan on April 27, 1996. At that time he noted that she had  intermittent periods of double vision and generalized weakness in her limbs  and lack of energy.   He noted also that the patient had had elevated acetylcholine receptor  antibody titers as well as positive single fiber EMG which I noted above.  The patient did not tolerate her prednisone because of massive weight gain.  She had not able to continue to take Mestinon and Imuran because she lost  her financial access when Medicaid was discontinued and she went onto  Medicare. She felt that Imuran had not helped her but she had not been on  the medication long enough for it to make a difference.   The patient did not suffer significantly with discontinuation of her  medication. Indeed, on the examination of May 24, 1996, the only weakness  that  was found was mild give way weakness in her deltoids bilaterally. She  had normal strength in her axial limbs, no sign of ptosis or eyelid droop.  No problems with her extraocular movements and no difficulty with  swallowing.   The plan was to repeat an acetylcholine receptor antibody titer. We do not  know whether this took place because she was lost to follow-up and our  records do not reflect that.   The patient's last pregnancy was in March and April of 2002. She came in at  day six postpartum, (April 26, 2000), with increase swelling in her hands and  feet, headache and blood pressures in the 150-160 over 80-90 range. She was  treated with magnesium sulfate 2 grams an hour until her symptoms relieved.  She was able to be discharged from the hospital the same day. There was no  mention of increased weakness. Magnesium is a known patient of neuromuscular  blockade. This is principal reason for our consultation today.   PAST MEDICAL HISTORY:  Remarkable for morbid obesity,  chronic hypertension,  gastroesophageal reflux disease. Serial ultrasounds have shown decreased  growth rate of the baby suggesting that the child is under some stress  because of maternal hypertension. In the hospital, the patient's diastolic  blood pressures have been low leading to a plan to decrease atenolol from  100 mg to 50 mg per day. Fetal Doppler's showed elevation at 4.1 with a  normal less than 3.57. I am not sure of the significance of this but it is  my understanding from speaking with the resident that there is concern about  decreased perfusion to the baby.   During the pregnancy the patient has complained of chronic daily headaches.  This has been a problem for her in the past. 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 She has had occasional  cystitis.  Her 12 system review is otherwise negative.   MEDICATIONS ON ADMISSION:  1.  Include atenolol which I mentioned  has dropped from 100 to 50 mg per      day.  2.  Prevacid 15 mg per day.  3.  Prenatal vitamins.  4.  The patient has been placed on Fexofenadine 180 mg per day beginning      today.   FAMILY HISTORY:  Remarkable for lupus in several maternal family members,  rheumatoid arthritis in her maternal grandmother, stroke in paternal  grandmother, history of migraines, sickle trait in father of birth, paternal  grandfather and paternal aunt.   SOCIAL HISTORY:  The patient is married. 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 She has a 44 year old daughter who went to [employer] for two years  and is now working in [place]. Her youngest child is 44 years of age.   The patient denies the use of tobacco, alcohol or drugs. She claims an  intolerance to prednisone which I have described above.   PHYSICAL EXAMINATION:  GENERAL:  On examination today, this is a morbidly  obese, very pleasant woman in no acute distress.  VITAL SIGNS:  Temperature 98.3, blood pressure 139/79, resting pulse 67,  respirations 18, capillary glucose 115.  HEENT:  Ears, nose and throat:  No signs of infection.  NECK:  Supple.  LUNGS:  Clear.  HEART:  No murmurs. Pulses normal.  ABDOMEN:  Massively protuberant. No hepatosplenomegaly. Bowel sounds were  normal.  EXTREMITIES:  Extremities were also large. No edema.  NEUROLOGICAL:  Mental status:  Awake, alert, attentive, appropriate. No  dysphasia or dyspraxia. Cranial nerves:  Round reactive pupils. Visual  fields full. Extraocular movements full and conjugate. There is no diplopia  to a red lens test.  Symmetric facial strength, midline tongue and uvula.  Air conduction greater than bone conduction. No eyelid ptosis.  Motor  examination:  Normal strength, tone and mass. Good fine motor movements. No pronator drift. Sensation intact to cold and vibration.  Stereognosis,  cerebellar examination:   Good finger-to-nose, rapid alternating movements.  Gait not tested. The patient is areflexic. She has bilateral flexor plantar  responses.   IMPRESSION:  1.  Generalized myasthenia gravis quiescent 358.0.  2.  Preeclampsia.  3.  Morbid obesity.   PLAN AND RECOMMENDATIONS:  I have reviewed the patient's 2002 records as  noted above. She tolerated magnesium sulfate in the past without  exacerbation of her symptoms. 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   RECOMMENDATIONS:  1.  I would use magnesium sulfate if the patient fails to respond to other      antihypertensives or if she starts to show other signs of progressive      eclampsia such as proteinuria or hyperreflexia.  2.  Watch out for generalized weakness which may be manifest in bulbar      muscles,  (eyelids, eye movements, face or swallowing difficulties),      muscles of respiration, or axial muscles.  3.  Consider a forced vital capacity and negative inspiratory force      pulmonary function test as a baseline for comparison.  4.  If weakness occurs, Mestinon bromide 30 mg every four hours can be used      and this can be increased by 15, increased or decreased by 15 mg      increments, watching for GI upset, diarrhea and tearing or increasing      weakness to alter prescription practices.  5.  Dilantin or phenobarbital can be given for seizures.  According to Dr.      Gavin Potters, Dilantin is preferred by obstetricians for control of her      seizures. Phenobarbital might be somewhat safer but only marginally so.  6.  Consider early delivery. This child should be stressed enough that      respiratory distress syndrome is unlikely and risk of intraventricular      hemorrhage is small. I appreciate the opportunity to see this patient.      If you have questions or if I could be of  assistance, do not hesitant to      contact me.  The only other recommendation would be to obtain      acetylcholine receptor antibodies to see if her condition is still      active with the notion that it might be worthwhile to treat however, if      she has long-term good strength without significant exacerbations, I am      not certain that it is worth the side effects of the medication or the      expense of it.      Deanna Artis. Sharene Skeans, M.D.  Electronically Signed     WHH/MEDQ  D:  09/04/2004  T:  09/04/2004  Job:  [id number]

## 2010-06-15 ENCOUNTER — Ambulatory Visit (INDEPENDENT_AMBULATORY_CARE_PROVIDER_SITE_OTHER): Payer: Medicaid Other | Admitting: *Deleted

## 2010-06-15 DIAGNOSIS — Z309 Encounter for contraceptive management, unspecified: Secondary | ICD-10-CM

## 2010-06-15 MED ORDER — MEDROXYPROGESTERONE ACETATE 150 MG/ML IM SUSP
150.0000 mg | Freq: Once | INTRAMUSCULAR | Status: AC
  Administered 2010-06-15: 150 mg via INTRAMUSCULAR

## 2010-07-30 ENCOUNTER — Encounter: Payer: Self-pay | Admitting: Family Medicine

## 2010-07-30 ENCOUNTER — Ambulatory Visit (INDEPENDENT_AMBULATORY_CARE_PROVIDER_SITE_OTHER): Payer: Medicaid Other | Admitting: Family Medicine

## 2010-07-30 VITALS — BP 178/105 | HR 126 | Temp 99.5°F | Ht 63.5 in | Wt 375.7 lb

## 2010-07-30 DIAGNOSIS — M549 Dorsalgia, unspecified: Secondary | ICD-10-CM

## 2010-07-30 DIAGNOSIS — I10 Essential (primary) hypertension: Secondary | ICD-10-CM

## 2010-07-30 DIAGNOSIS — M25569 Pain in unspecified knee: Secondary | ICD-10-CM

## 2010-07-30 DIAGNOSIS — M25561 Pain in right knee: Secondary | ICD-10-CM

## 2010-07-30 DIAGNOSIS — M25562 Pain in left knee: Secondary | ICD-10-CM

## 2010-07-30 DIAGNOSIS — L280 Lichen simplex chronicus: Secondary | ICD-10-CM

## 2010-07-30 MED ORDER — MONTELUKAST SODIUM 10 MG PO TABS: 10.0000 mg | ORAL_TABLET | Freq: Every day | ORAL | Status: DC

## 2010-07-30 MED ORDER — BUDESONIDE-FORMOTEROL FUMARATE 160-4.5 MCG/ACT IN AERO: 2.0000 | INHALATION_SPRAY | Freq: Two times a day (BID) | RESPIRATORY_TRACT | Status: DC

## 2010-07-30 MED ORDER — LISINOPRIL-HYDROCHLOROTHIAZIDE 20-12.5 MG PO TABS: 1.0000 | ORAL_TABLET | Freq: Every day | ORAL | Status: DC

## 2010-07-30 MED ORDER — CETIRIZINE HCL 10 MG PO TABS: 10.0000 mg | ORAL_TABLET | Freq: Every day | ORAL | Status: DC

## 2010-07-30 MED ORDER — DULOXETINE HCL 60 MG PO CPEP: 60.0000 mg | ORAL_CAPSULE | Freq: Every day | ORAL | Status: DC

## 2010-07-30 MED ORDER — AMLODIPINE BESYLATE 10 MG PO TABS: 10.0000 mg | ORAL_TABLET | Freq: Every day | ORAL | Status: DC

## 2010-07-30 MED ORDER — ALBUTEROL SULFATE HFA 108 (90 BASE) MCG/ACT IN AERS: 2.0000 | INHALATION_SPRAY | RESPIRATORY_TRACT | Status: DC | PRN

## 2010-07-30 MED ORDER — LISINOPRIL-HYDROCHLOROTHIAZIDE 20-12.5 MG PO TABS: 2.0000 | ORAL_TABLET | Freq: Every day | ORAL | Status: DC

## 2010-07-30 MED ORDER — HYDROCODONE-ACETAMINOPHEN 7.5-500 MG PO TABS: 1.0000 | ORAL_TABLET | Freq: Three times a day (TID) | ORAL | Status: DC | PRN

## 2010-07-30 NOTE — Assessment & Plan Note (Signed)
Pt does not seem motivated to lose weight today, but we did discuss the benefits of weight loss. We discussed how to take little steps to make big changes and that she should do it for her 4 boys. I told her I knew it wasn't easy, but we would support her in any way we could! She has had [facility]. The other option presenting to her today is a Synvisc injection given by an orthopedic specialist. She would like to think about trying the Synvisc before opting for knee replacement. I told her I would check about insurance coverage prior to making that appointment and would call her with options concerning that.  Pt states Lortab helps with the pain, will refill #30 with 0 refills today. Rx given to pt. Other option of scheduled Tylenol was presented to the pt and she states that would be too many pills and she would rather not do that.

## 2010-07-30 NOTE — Assessment & Plan Note (Signed)
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 Given reassurace that they were not cancerous but pt states she would like to be seen by dermatologist because they are bothersome to her. I told her we would talk about this at her next appointment.

## 2010-07-30 NOTE — Patient Instructions (Signed)
It was so nice to meet you today!  As far as your knee pain is concerned, please continue your pain medication as needed. You can take Advil or Tylenol as needed. I will check on Synvysc and if your insurance covers it, we will consider referral to an orthopedic doctor. You diet is important, and I urge you to continue to make small changes to help lose weight.  If the bumps on your arms, shoulder and head are still a concern, we will look again next visit and see where to go from there.  Please consider what we discussed about your medications today!  Thanks!  -Dr. Rodman Pickle

## 2010-07-30 NOTE — Assessment & Plan Note (Signed)
Pt's blood pressure was high today at her visit. She states she is not taking her medications as prescribed because she does not like pills. The subject of compromising on ONE pill daily vs. Three was discussed but pt did not seem to want to do that either. Since her blood pressure was so high today, we will wait until next visit to discuss this again and see how it is at that time. If still high, we will try to get her on one pill that she takes daily. Hopefully by reducing the amount she feels she is expected to take, she will be more likely to take her medications.

## 2010-07-30 NOTE — Progress Notes (Signed)
  Subjective:    Patient ID: [name], female    DOB: 1965/08/31, 45 y.o.   MRN: [id number]  HPI  1) Bilateral Knee Pain- Pt states this is her main concern today. She says the L>R, but both are bothering her. She is seen in sports medicine, and received bilateral steroid injections in May. The injections seem to help temporarily but she said she was told they may not be able to do any more shots. She does take Lortab prn for pain which she says help. Any activity makes the pain worse, and rest/elevation make it better. She has tried to do rehab in the past and was told the "more she uses it, the worse it gets." She has been told to lose weight but she says it is difficult for her to lose weight. She eats one large meal per day, but she states she has cut out a lot of sugary drinks. Her weight is 375lbs today. She uses a cane for ambulation. She states her knees constantly pop/grind but they do not "give out."   2) "Bumps popping up" - Pt complains of multiple knots that have come up in the last few months. She has one on her left wrist consistent with a ganglion cyst, that is not painful and has been there for over a year. Others are on her left forearm, right shoulder and her scalp. She states these are firm, and they itch. She has tried alcohol on them but that does not seem to change anything. She states they have not changed, but she is concerned that so many have appeared that it might be cancerous.   3) HTN- Her blood pressure was 178/105 today and she was diaphoretic when I got to exam room. Pt states her b/p is high with any activity and it "takes a while for it to come down." During the medication list review, pt told me she wasn't taking her medications consisently. She said she does not take her HTN meds everyday, and not because she didn't have them, but mostly because she did not like to take pills. She said if she was feeling bad she would take them, but it was not regularly. She does  endorse a headache today.    Review of Systems  Constitutional: Negative for activity change and unexpected weight change.  Respiratory: Negative for cough and chest tightness.   Cardiovascular: Negative for chest pain.  Musculoskeletal: Positive for back pain, joint swelling, arthralgias and gait problem.  Neurological: Positive for headaches. Negative for dizziness.       Objective:   Physical Exam  Nursing note and vitals reviewed. Constitutional: She appears well-developed. No distress.  HENT:  Head: Normocephalic.       2 firm nodules noted on cheeks; Nonpainful. 2 mucoceles on lower lip.  Musculoskeletal:       Right knee: She exhibits decreased range of motion. tenderness found. Medial joint line and lateral joint line tenderness noted.       Left knee: She exhibits decreased range of motion. tenderness found. Medial joint line and lateral joint line tenderness noted.       Both knees tender to palpation, but most painful on left medial joint line. Pt's body habitus makes examination difficult but she is able to bend her knees; Popping/grinding noted bilaterally.   Skin: She is diaphoretic.             Assessment & Plan:

## 2010-07-31 ENCOUNTER — Telehealth: Payer: Self-pay | Admitting: Family Medicine

## 2010-07-31 NOTE — Telephone Encounter (Signed)
Spoke with [name] concerning Synvisc injections. Through my research and with speaking with Dr. Deirdre Priest concerning medicaid coverage of Synvisc, the coverage is not very good (total of $210/visit) and we are not sure of any orthopedic would be willing to do that at this time. I encouraged her to speak with her sports med doctor who has been doing her steroid injections about what he recommended as far as continuing steroids vs. Synvisc and if he knew of exactly where she could go to get it done.  She said she would contact sports med and was very understanding of the situation.  Kal Chait M. Yashas Camilli, M.D.

## 2010-09-25 ENCOUNTER — Ambulatory Visit (INDEPENDENT_AMBULATORY_CARE_PROVIDER_SITE_OTHER): Payer: Medicaid Other | Admitting: *Deleted

## 2010-09-25 DIAGNOSIS — Z309 Encounter for contraceptive management, unspecified: Secondary | ICD-10-CM

## 2010-09-25 LAB — POCT URINE PREGNANCY: Preg Test, Ur: NEGATIVE

## 2010-09-25 MED ORDER — MEDROXYPROGESTERONE ACETATE 150 MG/ML IM SUSP
150.0000 mg | Freq: Once | INTRAMUSCULAR | Status: AC
  Administered 2010-09-25: 150 mg via INTRAMUSCULAR

## 2010-10-05 ENCOUNTER — Ambulatory Visit (INDEPENDENT_AMBULATORY_CARE_PROVIDER_SITE_OTHER): Payer: Medicaid Other | Admitting: Family Medicine

## 2010-10-05 ENCOUNTER — Encounter: Payer: Self-pay | Admitting: Family Medicine

## 2010-10-05 VITALS — BP 205/119 | HR 98 | Ht 63.0 in | Wt 370.0 lb

## 2010-10-05 DIAGNOSIS — Z124 Encounter for screening for malignant neoplasm of cervix: Secondary | ICD-10-CM

## 2010-10-05 DIAGNOSIS — M25562 Pain in left knee: Secondary | ICD-10-CM

## 2010-10-05 DIAGNOSIS — M25569 Pain in unspecified knee: Secondary | ICD-10-CM

## 2010-10-05 DIAGNOSIS — N3946 Mixed incontinence: Secondary | ICD-10-CM

## 2010-10-05 DIAGNOSIS — L280 Lichen simplex chronicus: Secondary | ICD-10-CM

## 2010-10-05 DIAGNOSIS — M25561 Pain in right knee: Secondary | ICD-10-CM

## 2010-10-05 DIAGNOSIS — I10 Essential (primary) hypertension: Secondary | ICD-10-CM

## 2010-10-05 LAB — LIPID PANEL
Cholesterol: 170 mg/dL (ref 0–200)
Triglycerides: 96 mg/dL (ref <150)
VLDL: 19 mg/dL (ref 0–40)

## 2010-10-05 LAB — COMPREHENSIVE METABOLIC PANEL
ALT: 12 U/L (ref 0–35)
Alkaline Phosphatase: 84 U/L (ref 39–117)
Sodium: 140 mEq/L (ref 135–145)
Total Bilirubin: 0.5 mg/dL (ref 0.3–1.2)
Total Protein: 7.4 g/dL (ref 6.0–8.3)

## 2010-10-05 LAB — CBC
MCH: 27.9 pg (ref 26.0–34.0)
MCHC: 32.7 g/dL (ref 30.0–36.0)
Platelets: 277 10*3/uL (ref 150–400)
RDW: 15.5 % (ref 11.5–15.5)

## 2010-10-05 MED ORDER — CLONIDINE HCL 0.1 MG/24HR TD PTWK: 1.0000 | MEDICATED_PATCH | TRANSDERMAL | Status: DC

## 2010-10-05 NOTE — Assessment & Plan Note (Signed)
Pt states these are still bothering her. I do not feel like dermatology will be helpful given the benign states of these lesions. I understand pt's desire to go to derm to be checked out, and we can further discuss this at last visit but I feel like this is lichen simplex and it is exacerbated by irritation.

## 2010-10-05 NOTE — Assessment & Plan Note (Signed)
Pt states this is bothering her daily, and that Ditropan did not work before. She is not currently taking Ditropan except when she feels like it. I told her that this was a good medication for the symptoms she is having and she said she would be willing to give it another try. I will follow up with her at her next visit to see if she did take it, and if she was successful with her symptoms.

## 2010-10-05 NOTE — Progress Notes (Signed)
  Subjective:    Patient ID: [name], female    DOB: [birth year], 45 y.o.   MRN: [id number]  HPI Pt is a 45 yo F with a PMH of morbid obesity, uncontrolled HTN, myasthenia gravis, knee pain and GERD who is presenting today for a physical exam/follow-up.   1. Physical Exam- Pt's last pap smear was in 2010. She is over the age of 28, no recent abnormal pap and on Depo. She is now on a pap every 3 year schedule. Therefore, her pap was not done today but she will return next year for her pap smear.  2. HTN-BP 205/119 today at triage. Pt is not compliant with her medications. She states she only takes her medications sporadically. She does not take anything daily, except her hydrocodone which she needs to "get around". She states she has nausea and does not like to take medications when her stomach does not feel good. She denies any symptoms from her high blood pressure.  3. Incontinence- Pt states this has been an ongoing problem. She is unable to hold her urine and if she does make it to the bathroom, she soils herself before she can get her pants pulled down. She states this limits her daily activities because she is either afraid she will have an accident, or she is embarrassed. Pt does not take Ditropan because she states it did not help.   4. Lichen simplex- Pt states it is really bothering her. She states she has "new bumps" popping up on her skin and she feels like there is something wrong since she has so many. She states they are in her scalp, arm, shoulders. She does scratch them.   Review of Systems  Constitutional: Positive for activity change and appetite change.  HENT: Negative for congestion.   Eyes: Negative for visual disturbance.  Respiratory: Positive for shortness of breath. Negative for cough.   Cardiovascular: Negative for chest pain and leg swelling.  Gastrointestinal: Positive for nausea and abdominal pain. Negative for constipation.  Genitourinary: Positive for  urgency.  Musculoskeletal: Positive for myalgias and arthralgias.  Skin: Positive for rash.  Neurological: Negative for dizziness.       Objective:   Physical Exam  Constitutional: She is oriented to person, place, and time. She appears well-developed and well-nourished. No distress.  HENT:  Head: Normocephalic and atraumatic.  Eyes: Pupils are equal, round, and reactive to light.  Neck: Normal range of motion. Neck supple.  Cardiovascular: Normal rate, regular rhythm and normal heart sounds.  Exam reveals no gallop and no friction rub.   No murmur heard. Pulmonary/Chest: Effort normal and breath sounds normal. No respiratory distress. She has no wheezes.  Abdominal: Soft. She exhibits no distension. There is tenderness in the epigastric area.       Morbidly obese  Musculoskeletal: She exhibits tenderness. She exhibits no edema.       Right knee: She exhibits decreased range of motion and bony tenderness.       Left knee: She exhibits decreased range of motion and bony tenderness.  Neurological: She is alert and oriented to person, place, and time. No cranial nerve deficit or sensory deficit.  Skin: She is not diaphoretic.       Round, papular, scarred lesions on forearm, right cheek, right shoulder and scalp. No oozing, no pus, no breakdown.  Pt also has hives on left forearm and left breast          Assessment & Plan:

## 2010-10-05 NOTE — Assessment & Plan Note (Signed)
Pt seen by sports med who gives her steroid injections. She is not a candidate for Synvisc due to cost. She states that it is controlled with Lortab. She will follow-up with Sports medicine for further evaluation and treatment.

## 2010-10-05 NOTE — Patient Instructions (Signed)
It was nice to see you today!  For your blood pressure, I have ordered you a Clonidine patch to wear for 7 days, then replace. I hope this works for you! Please do not forget to replace the patch.  I will talk to clinic staff about referrals to Dermatology, we can discuss this when you come see me next time.  Continue to take your Ditropan daily. I hope that helps!  I am giving you orders to get labs done one morning before breakfast, when you can. Come see me in 4-6 weeks for a follow-up and we will give your flu shot then as well.  Take care! Amber M. Hairford, M.D.

## 2010-10-05 NOTE — Assessment & Plan Note (Addendum)
BP 205/119 today...she is still not compliant with her medications. We discussed the implications of having a blood pressure this high including the stress it puts on her heart, and that it will lead to death. When asked about barriers to taking her medications, she told me it was because she didn't want her stomach to hurt and she does not like to take medications. For now, I am starting her on Clondine patch 0.1/24 hours to see if that helps with compliance. She was told to make sure she always replaced her patch. Also, I would like for her to go see Dr. Raymondo Band in pharmacy clinic to see if he has any other suggestions on how to either change her medication regimen or encourage her to take her medicines. She will follow-up with me in 4-6 weeks for her HTN to see how the patch is doing for her. Lipid, CBC, Cmet ordered for her to complete when fasting.

## 2010-10-06 ENCOUNTER — Encounter: Payer: Self-pay | Admitting: Family Medicine

## 2010-10-16 ENCOUNTER — Ambulatory Visit (INDEPENDENT_AMBULATORY_CARE_PROVIDER_SITE_OTHER): Payer: Medicaid Other | Admitting: Pharmacist

## 2010-10-16 ENCOUNTER — Encounter: Payer: Self-pay | Admitting: Pharmacist

## 2010-10-16 DIAGNOSIS — I10 Essential (primary) hypertension: Secondary | ICD-10-CM

## 2010-10-16 NOTE — Assessment & Plan Note (Signed)
BP remains greater than goal. Non adherence due to inability to remember or be consistent with taking currently prescribed medications.  Patient is willing to try using her cell phone as a reminder system (alarm system) to keep her reminded of times to take medications.   We will split the two doses of lisinopril/HCTZ to 20/12.5 in the AM and the second dose in the PM with omeprazole and oxybutynin (3 pills in the PM).  She was provided a small pill box which should allow her to carry her PM pills with her to take at any time when her alarm alerts her to take her PM meds.   She will use her cell phone alarm to also remind her to take her AM meds at home.   Plan is for her to follow up in ~ 3 weeks with Dr. Mikel Cella.  If BP remains elevated at time we will consider adding back amlodipine ( this medication was held today as she was NOT taking any of her BP pills consistently and her BP today is likely due mainly to clonidine patch.   Patient verbalized understanding of tx. Plan.  Written info provided.   TTFC = 35 minutes.

## 2010-10-16 NOTE — Progress Notes (Signed)
  Subjective:    Patient ID: [name], female    DOB: 1965/02/20, 45 y.o.   MRN: [id number]  HPI Patient arrive accompanied by her youngest child.   She walks with the assistance of a cane.   She reports having no issues with purchasing or obtaining medications, however, patient finds it very difficult to remember to take her medications.   She is happy with her clonidine patch.    She reports difficulty with taking medications that are also for symptomatic conditions including her GERD - omeprazole and her incontinence - oxybutynin.   She verbalizes that the risk of high blood pressure is increased risk of stroke and heart attack.      Review of Systems     Objective:   Physical Exam        Assessment & Plan:  Non adherence due to inability to remember or be consistent with taking currently prescribed medications.  Patient is willing to try using her cell phone as a reminder system (alarm system) to keep her reminded of times to take medications.   We will split the two doses of lisinopril/HCTZ to 20/12.5 in the AM and the second dose in the PM with omeprazole and oxybutynin (3 pills in the PM).  She was provided a small pill box which should allow her to carry her PM pills with her to take at any time when her alarm alerts her to take her PM meds.   She will use her cell phone alarm to also remind her to take her AM meds at home.   Plan is for her to follow up in ~ 3 weeks with Dr. Mikel Cella.  If BP remains elevated at time we will consider adding back amlodipine ( this medication was held today as she was NOT taking any of her BP pills consistently and her BP today is likely due mainly to clonidine patch.   Patient verbalized understanding of tx. Plan.  Written info provided.   TTFC = 35 minutes.

## 2010-10-16 NOTE — Patient Instructions (Signed)
Stop Amlodipine for NOW.  We may need to go back to this in the future.  Take Lisinopril/HCTZ TWICE daily One pill in the AM and One pill in the PM. Take omeprazole and ditropan in the evening.  Continue patch same dose at this time.  Next visit with Dr. Mikel Cella in October.

## 2010-10-18 ENCOUNTER — Other Ambulatory Visit: Payer: Self-pay | Admitting: Family Medicine

## 2010-10-18 DIAGNOSIS — K219 Gastro-esophageal reflux disease without esophagitis: Secondary | ICD-10-CM

## 2010-10-19 NOTE — Progress Notes (Signed)
  Subjective:    Patient ID: [name], female    DOB: 01/25/1965, 45 y.o.   MRN: [id number]  HPI Reviewed and agree with Dr. Koval's management.    Review of Systems     Objective:   Physical Exam        Assessment & Plan:   

## 2010-10-20 ENCOUNTER — Encounter: Payer: Self-pay | Admitting: Family Medicine

## 2010-10-23 ENCOUNTER — Other Ambulatory Visit: Payer: Self-pay | Admitting: *Deleted

## 2010-10-23 DIAGNOSIS — R32 Unspecified urinary incontinence: Secondary | ICD-10-CM

## 2010-10-23 MED ORDER — OXYBUTYNIN CHLORIDE 5 MG PO TABS: 5.0000 mg | ORAL_TABLET | Freq: Two times a day (BID) | ORAL | Status: DC

## 2010-10-28 LAB — CBC
HCT: 36.8
HCT: 40.3
Hemoglobin: 11.8 — ABNORMAL LOW
Hemoglobin: 12.3
Hemoglobin: 13.1
MCHC: 33.3
MCV: 87
MCV: 88.2
MCV: 88.5
Platelets: 320
RBC: 4.02
RBC: 4.22
RBC: 4.28
WBC: 14 — ABNORMAL HIGH
WBC: 8.7
WBC: 8.9
WBC: 9.2

## 2010-10-28 LAB — BODY FLUID CELL COUNT WITH DIFFERENTIAL
Lymphs, Fluid: 31
Other Cells, Fluid: 0

## 2010-10-28 LAB — BASIC METABOLIC PANEL
CO2: 28
CO2: 29
Calcium: 8.8
Calcium: 8.9
Calcium: 9
Calcium: 9
Chloride: 104
Chloride: 105
Chloride: 107
Creatinine, Ser: 0.7
Creatinine, Ser: 0.84
GFR calc Af Amer: >60
GFR calc Af Amer: >60
GFR calc Af Amer: >60
GFR calc Af Amer: >60
GFR calc non Af Amer: >60
Glucose, Bld: 82
Potassium: 3.1 — ABNORMAL LOW
Sodium: 139
Sodium: 142

## 2010-10-28 LAB — ANGIOTENSIN CONVERTING ENZYME: Angiotensin-Converting Enzyme: 11 U/L (ref 9–67)

## 2010-10-28 LAB — AFB CULTURE WITH SMEAR (NOT AT ARMC): Acid Fast Smear: NONE SEEN

## 2010-10-28 LAB — COMPREHENSIVE METABOLIC PANEL
Albumin: 3 — ABNORMAL LOW
BUN: 6
Chloride: 103
Creatinine, Ser: 0.83
GFR calc non Af Amer: >60
Glucose, Bld: 109 — ABNORMAL HIGH
Total Bilirubin: 0.4

## 2010-10-28 LAB — B-NATRIURETIC PEPTIDE (CONVERTED LAB): Pro B Natriuretic peptide (BNP): <30

## 2010-10-28 LAB — DIFFERENTIAL
Eosinophils Relative: 1
Lymphocytes Relative: 13
Monocytes Absolute: 0.8
Monocytes Relative: 10
Neutro Abs: 6.8

## 2010-10-28 LAB — LEGIONELLA ANTIGEN, URINE: Legionella Antigen, Urine: NEGATIVE

## 2010-10-28 LAB — BLOOD GAS, ARTERIAL
Acid-Base Excess: 4.2 — ABNORMAL HIGH
Patient temperature: 98.6
TCO2: 28.5

## 2010-10-28 LAB — FUNGUS CULTURE W SMEAR

## 2010-10-28 LAB — ANA: Anti Nuclear Antibody(ANA): POSITIVE — AB

## 2010-10-28 LAB — CULTURE, RESPIRATORY W GRAM STAIN: Gram Stain: NONE SEEN

## 2010-10-28 LAB — PATHOLOGIST SMEAR REVIEW

## 2010-10-28 LAB — MAGNESIUM: Magnesium: 2.3

## 2010-10-28 LAB — STREP PNEUMONIAE URINARY ANTIGEN: Strep Pneumo Urinary Antigen: NEGATIVE

## 2010-11-10 ENCOUNTER — Encounter: Payer: Self-pay | Admitting: Family Medicine

## 2010-11-10 ENCOUNTER — Ambulatory Visit (INDEPENDENT_AMBULATORY_CARE_PROVIDER_SITE_OTHER): Payer: Medicaid Other | Admitting: Family Medicine

## 2010-11-10 DIAGNOSIS — G5600 Carpal tunnel syndrome, unspecified upper limb: Secondary | ICD-10-CM

## 2010-11-10 DIAGNOSIS — M549 Dorsalgia, unspecified: Secondary | ICD-10-CM

## 2010-11-10 DIAGNOSIS — R32 Unspecified urinary incontinence: Secondary | ICD-10-CM

## 2010-11-10 DIAGNOSIS — M159 Polyosteoarthritis, unspecified: Secondary | ICD-10-CM

## 2010-11-10 DIAGNOSIS — L280 Lichen simplex chronicus: Secondary | ICD-10-CM

## 2010-11-10 DIAGNOSIS — IMO0002 Reserved for concepts with insufficient information to code with codable children: Secondary | ICD-10-CM

## 2010-11-10 DIAGNOSIS — Z23 Encounter for immunization: Secondary | ICD-10-CM

## 2010-11-10 DIAGNOSIS — M179 Osteoarthritis of knee, unspecified: Secondary | ICD-10-CM

## 2010-11-10 DIAGNOSIS — M1710 Unilateral primary osteoarthritis, unspecified knee: Secondary | ICD-10-CM

## 2010-11-10 DIAGNOSIS — I10 Essential (primary) hypertension: Secondary | ICD-10-CM

## 2010-11-10 DIAGNOSIS — J45901 Unspecified asthma with (acute) exacerbation: Secondary | ICD-10-CM

## 2010-11-10 MED ORDER — HYDROCODONE-ACETAMINOPHEN 7.5-500 MG PO TABS: 1.0000 | ORAL_TABLET | Freq: Three times a day (TID) | ORAL | Status: DC | PRN

## 2010-11-10 MED ORDER — CLONIDINE HCL 0.1 MG/24HR TD PTWK: 1.0000 | MEDICATED_PATCH | TRANSDERMAL | Status: DC

## 2010-11-10 MED ORDER — WRIST SPLINT/COCK-UP/LEFT L MISC: 1.0000 | Status: DC

## 2010-11-10 MED ORDER — WRIST SPLINT/COCK-UP/RIGHT L MISC: 1.0000 | Status: DC

## 2010-11-10 MED ORDER — PREDNISONE 50 MG PO TABS: ORAL_TABLET | ORAL | Status: AC

## 2010-11-10 MED ORDER — OXYBUTYNIN CHLORIDE 5 MG PO TABS: 5.0000 mg | ORAL_TABLET | Freq: Two times a day (BID) | ORAL | Status: DC

## 2010-11-10 NOTE — Assessment & Plan Note (Signed)
Healed areas on forearms and legs which were previously evaluated. 2 places on her face have not resolved, and continue to get worse. She states she tries to "leave them alone", and although they appear to be from chronic itching to me, I will refer to dermatology for another evaluation. Patient is concerned about them and would appreciate this second opinion as well. For now, continue to encourage patient to not scratch or irritate these areas as well as the areas on her scalp. Will re-evaluate in 6-8 weeks, and by that time she will have hopefully seen a dermatologist.

## 2010-11-10 NOTE — Assessment & Plan Note (Signed)
Pt wears cock up splits to help with pain. She was given printed Rx for new splits that she will take to the pharmacy. She was unsure of her size, I gave her large. If that is not the appropriate size, she will call the clinic for a new size.

## 2010-11-10 NOTE — Progress Notes (Signed)
  Subjective:    Patient ID: [name], female    DOB: April 26, 1965, 45 y.o.   MRN: [id number]  HPI  Pt is a 45 yo F who presents to the office today for a BP follow-up and with a complaint of chest congestion. 1. BP: Pt was started on Clonidine patch, and was referred to Dr. Raymondo Band for further medication reconciliation. Since starting the patch, her blood pressures have been much better controlled. She has gone from a systolic in the 110's to systolic in the 80's. She states she likes the patch and it works well for her. Dr. Raymondo Band restarted her on Lisinopril/HCT and reminded her to use her cell phone as a reminder, which she states is working well. She has recorded home BP which range from 115/81-137/87. I am very pleased with her improvement.  2. Chest congestion: Pt has history of asthma. Her last flare was in Feb 2012. She states Prednisone is usually the only thing that helps. She is using her Symbicort, Singulair and Albuterol prn but continues to have a difficult time breathing. She has a productive cough of yellow/brown sputum. She has not seen any blood in her sputum, but she states she "tastes blood when she cough." Her SpO2 was 96% in the office today, and she is afebrile. 3. Knee pain: Chronic, bilateral. She has had injections in the past. Next she will need surgery. She is requesting a refill of her pain medication. Last filled in July 2012, and she only uses sparingly at night. Continues to use Tylenol as needed. 4. Bumps on face: Bumps have been there for many months. On initial evaluation, it was thought these bumps were secondary to pathologic skin picking/scratching. She was given reassurance, but these areas continue to bother her and are now painful.  Review of Systems  Constitutional: Negative for fever, chills and activity change.  HENT: Positive for congestion. Negative for sore throat and rhinorrhea.   Respiratory: Positive for cough, chest tightness, shortness of breath and  wheezing.   Cardiovascular: Negative for chest pain and leg swelling.  Gastrointestinal: Negative for nausea and vomiting.  Genitourinary: Positive for difficulty urinating.  Musculoskeletal: Positive for myalgias and arthralgias.  Skin: Negative for rash.  All other systems reviewed and are negative.       Objective:   Physical Exam  Nursing note and vitals reviewed. Constitutional: She is oriented to person, place, and time. She appears well-developed and well-nourished. No distress.  HENT:  Head: Normocephalic and atraumatic.  Eyes: Pupils are equal, round, and reactive to light.  Neck: Normal range of motion.  Cardiovascular: Normal rate, regular rhythm and normal heart sounds.   Pulmonary/Chest: Effort normal. She has wheezes (Diffuse).       Prolonged expiratory phase  Abdominal: Soft.  Musculoskeletal: She exhibits edema and tenderness.       Decreased ROM of knees bilaterally. Walks with cane.  Neurological: She is alert and oriented to person, place, and time.  Skin: Skin is warm and dry. Rash noted. She is not diaphoretic.       Pustular like rash on cheeks bilaterally. Right cheek erythematous. Firm. Also states she has similar bumps on scalp but unable to locate them today. Scarred/healed areas on legs and forearms from previous areas.   Psychiatric: She has a normal mood and affect.          Assessment & Plan:

## 2010-11-10 NOTE — Assessment & Plan Note (Signed)
Pt on Catapres 0.1mg  and Lisinopril/HCTZ 20/12.5 at this time. Her blood pressure has improved. Will continue current regimen. She will continue to write down her home recordings and bring those to her next visit. Since her diastolic was still 140's today, we can consider increasing LIsinopril at a future visit, if needed. Pt is happy with current medications and I am pleased with her progress. Will RTC in 6-8 weeks for follow up.

## 2010-11-10 NOTE — Assessment & Plan Note (Signed)
Pain of knees bilaterally. Has not had steroid injection in a while. Refilled her pain medication today with a new Narc[employer] contract. She was told to use sparingly and this is not a long term solution for her pain. Continue to monitor.

## 2010-11-10 NOTE — Assessment & Plan Note (Signed)
Patient has flare of asthma. O2 sat at 96% but does have concerning physical exam. Steroids help her in the past. Given her productive cough and wheeze, will give Prednisone 50mg  for 5 days. Encouraged her to continue to use inhalers. Red flag symptoms discussed. Will return to clinic if she has worsening symptoms.

## 2010-11-10 NOTE — Patient Instructions (Signed)
It was nice to see you today!  I am glad your blood pressure is better! Please continue to do what you are doing, it is working!  I have reordered medication, and I will send a referral to the dermatologist.  I will see you back in 6-8 weeks.  Madesyn Ast M. Bricyn Labrada, M.D.

## 2010-12-12 ENCOUNTER — Emergency Department: Payer: Self-pay | Admitting: Emergency Medicine

## 2010-12-14 ENCOUNTER — Other Ambulatory Visit: Payer: Self-pay | Admitting: Family Medicine

## 2010-12-14 MED ORDER — PREDNISONE (PAK) 10 MG PO TABS: ORAL_TABLET | ORAL | Status: DC

## 2010-12-23 ENCOUNTER — Ambulatory Visit
Admission: RE | Admit: 2010-12-23 | Discharge: 2010-12-23 | Disposition: A | Discharge status: Home / Self Care | Payer: Medicaid Other | Source: Ambulatory Visit | Attending: Family Medicine | Admitting: Family Medicine

## 2010-12-23 ENCOUNTER — Ambulatory Visit: Payer: Medicaid Other

## 2010-12-23 ENCOUNTER — Ambulatory Visit (INDEPENDENT_AMBULATORY_CARE_PROVIDER_SITE_OTHER): Payer: Medicaid Other | Admitting: Family Medicine

## 2010-12-23 ENCOUNTER — Encounter: Payer: Self-pay | Admitting: Family Medicine

## 2010-12-23 VITALS — BP 172/91 | HR 99 | Temp 98.6°F | Ht 63.0 in | Wt 361.0 lb

## 2010-12-23 DIAGNOSIS — M549 Dorsalgia, unspecified: Secondary | ICD-10-CM

## 2010-12-23 DIAGNOSIS — M25569 Pain in unspecified knee: Secondary | ICD-10-CM

## 2010-12-23 DIAGNOSIS — J45909 Unspecified asthma, uncomplicated: Secondary | ICD-10-CM

## 2010-12-23 DIAGNOSIS — Z111 Encounter for screening for respiratory tuberculosis: Secondary | ICD-10-CM

## 2010-12-23 DIAGNOSIS — D869 Sarcoidosis, unspecified: Secondary | ICD-10-CM

## 2010-12-23 DIAGNOSIS — M25561 Pain in right knee: Secondary | ICD-10-CM

## 2010-12-23 DIAGNOSIS — Z309 Encounter for contraceptive management, unspecified: Secondary | ICD-10-CM

## 2010-12-23 HISTORY — DX: Sarcoidosis, unspecified: D86.9

## 2010-12-23 MED ORDER — HYDROCODONE-ACETAMINOPHEN 7.5-500 MG PO TABS: 1.0000 | ORAL_TABLET | Freq: Three times a day (TID) | ORAL | Status: DC | PRN

## 2010-12-23 MED ORDER — MEDROXYPROGESTERONE ACETATE 150 MG/ML IM SUSP
150.0000 mg | Freq: Once | INTRAMUSCULAR | Status: AC
  Administered 2010-12-23: 150 mg via INTRAMUSCULAR

## 2010-12-23 NOTE — Assessment & Plan Note (Signed)
Recent flare, seen in ED in [place]. Received nebs, augmentin and steroids. Patient improved. Will continue to monitor. Patient is taking inhalers.

## 2010-12-23 NOTE — Patient Instructions (Signed)
It was nice to see you today!  Please make an appointment to get your knee injection at sports medicine. I have refilled your pain medication.  Also, to investigate your lungs, please have your chest X-ray done today.  Come back to clinic in Friday to have your TB test read.  Please make an appointment in pharmacy clinic to get pulmonary lung function tests.  Also, please call your eye doctor for an exam.  Continue taking your inhalers for your shortness of breath and wheezing.  Come back to see me in 4 weeks, or earlier if needed.  Take care! Atisha Hamidi M. Tonea Leiphart, M.D.

## 2010-12-23 NOTE — Progress Notes (Signed)
  Subjective:    Patient ID: [name], female    DOB: 1965-10-14, 45 y.o.   MRN: [id number]  HPI Patient is a 45 yo F presenting for follow-up. Recently went to Dermatology for bumps on her arm. Biopsy revealed sarcoid. Patient states she is happy to know what the lesions are, and is interested in further investigation.  Also, her knees have been bothering her more than usual. Her last injection was in May of this year which was helpful. She states she was told by sports med that she may need surgery, but at this time, I do not think she is a good surgical candidate. She is taking pain medication and using heat as needed, both of which offer temporary relief. She states the weather and walking make her pain much worse. She is morbidly obese which is likely contributing as well. She states she wants to go back to sports med for another injection rather than getting injections in this clinic due to previous bad experience.  Lastly, patient was seen in the ED in [place] 10 days ago for acute asthma exacerbation. She received breathing treatment, Augmentin and steroids. She states she is feeling better but still has minimal wheezing. (Of note, she does state the steroids she took for her breathing did help the cutaneous lesions on her face.)   Review of Systems  Constitutional: Positive for activity change. Negative for fever.  HENT: Negative for congestion and trouble swallowing.   Eyes: Positive for pain and discharge.  Respiratory: Positive for cough, shortness of breath and wheezing.   Cardiovascular: Negative for chest pain.  Gastrointestinal: Negative for nausea and vomiting.  Genitourinary: Negative for difficulty urinating.  Musculoskeletal: Positive for arthralgias.  Skin: Positive for rash.  Neurological: Negative for light-headedness.  All other systems reviewed and are negative.       Objective:   Physical Exam  Constitutional: She is oriented to person, place, and  time. She appears well-developed and well-nourished. No distress.  HENT:  Head: Normocephalic and atraumatic.  Cardiovascular: Normal rate and regular rhythm.   No murmur heard. Pulmonary/Chest: Effort normal. No respiratory distress. She has wheezes. She has no rales.  Abdominal: Soft. She exhibits no distension. There is no tenderness.       obese  Musculoskeletal: She exhibits tenderness.       Right knee: She exhibits decreased range of motion. tenderness found.       Left knee: She exhibits decreased range of motion. tenderness found.  Neurological: She is alert and oriented to person, place, and time.  Skin: She is not diaphoretic.       Papular lesions on cheeks, right elbow noted. Healed scar on right forearm from biopsy.          Assessment & Plan:

## 2010-12-23 NOTE — Assessment & Plan Note (Signed)
Patient is willing to have full work-up to evaluate lungs. She does have a history of asthma and had recent flare. For now, will get CXR 2 view today. PPD placed today (will return for reading in 48 hours). Last labs were in September and within normal limits. Patient will make an appointment with her eye doctor for an eye exam. For further evaluation, I would also like to get spirometry on the patient. I have asked her to make an appointment with Dr. Raymondo Band in our clinic to get PFTs. Depending on the results of these tests, I may refer to pulmonology and/or rheumatology. She may need chronic steroids to treat, although patient is not open to this idea at this time. Will see her back in 4 weeks and discuss all the results of her tests.

## 2010-12-23 NOTE — Assessment & Plan Note (Signed)
Patient is interested in getting knee injections. She is not comfortable getting injected in this clinic and prefers to go to sports med. I have encouraged her to make that appointment. Refilled Vicodin. Weight loss will be beneficial for patient, and she is aware of this.

## 2010-12-25 ENCOUNTER — Ambulatory Visit (INDEPENDENT_AMBULATORY_CARE_PROVIDER_SITE_OTHER): Payer: Medicaid Other | Admitting: *Deleted

## 2010-12-25 DIAGNOSIS — IMO0001 Reserved for inherently not codable concepts without codable children: Secondary | ICD-10-CM

## 2010-12-25 DIAGNOSIS — Z111 Encounter for screening for respiratory tuberculosis: Secondary | ICD-10-CM

## 2010-12-25 LAB — TB SKIN TEST: Induration: 0

## 2010-12-27 ENCOUNTER — Other Ambulatory Visit: Payer: Self-pay | Admitting: Family Medicine

## 2010-12-27 DIAGNOSIS — I10 Essential (primary) hypertension: Secondary | ICD-10-CM

## 2010-12-27 NOTE — Telephone Encounter (Signed)
Refill request

## 2011-01-08 ENCOUNTER — Ambulatory Visit (INDEPENDENT_AMBULATORY_CARE_PROVIDER_SITE_OTHER): Payer: Medicaid Other | Admitting: Pharmacist

## 2011-01-08 ENCOUNTER — Ambulatory Visit
Admission: RE | Admit: 2011-01-08 | Discharge: 2011-01-08 | Disposition: A | Discharge status: Home / Self Care | Payer: Medicaid Other | Source: Ambulatory Visit | Attending: Family Medicine | Admitting: Family Medicine

## 2011-01-08 ENCOUNTER — Ambulatory Visit (INDEPENDENT_AMBULATORY_CARE_PROVIDER_SITE_OTHER): Payer: Medicaid Other | Admitting: Family Medicine

## 2011-01-08 ENCOUNTER — Encounter: Payer: Self-pay | Admitting: Pharmacist

## 2011-01-08 VITALS — Ht 63.0 in | Wt 362.2 lb

## 2011-01-08 VITALS — BP 152/96

## 2011-01-08 DIAGNOSIS — IMO0002 Reserved for concepts with insufficient information to code with codable children: Secondary | ICD-10-CM

## 2011-01-08 DIAGNOSIS — M179 Osteoarthritis of knee, unspecified: Secondary | ICD-10-CM

## 2011-01-08 DIAGNOSIS — M1710 Unilateral primary osteoarthritis, unspecified knee: Secondary | ICD-10-CM

## 2011-01-08 DIAGNOSIS — J45901 Unspecified asthma with (acute) exacerbation: Secondary | ICD-10-CM

## 2011-01-08 NOTE — Progress Notes (Signed)
  Subjective:    Patient ID: [name], female    DOB: 04/23/1965, 45 y.o.   MRN: [id number]  HPI Reviewed and agree with Dr. Macky Lower management.    Review of Systems     Objective:   Physical Exam        Assessment & Plan:

## 2011-01-08 NOTE — Assessment & Plan Note (Signed)
Spirometry evaluation with Pre-Bronchodilator reveals near normal lung function.  Patient has been experiencing asthma symptoms and taking albuterol and Symbicort (once daily). Continue current treatment plan at this time.  Counseled pt to increase Symbicort to twice daily administration to improve control of asthma and decrease albuterol use.  Reviewed results of pulmonary function tests.  Pt verbalized understanding of results. Written pt instructions provided.  F/U Clinic visit with Dr. Mikel Cella.  Total time in face to face counseling 25 minutes.  Patient seen with Maudry Mayhew, Pharmacy Resident.

## 2011-01-08 NOTE — Progress Notes (Signed)
Subjective:    Patient ID: [name], female    DOB: July 27, 1965, 45 y.o.   MRN: [id number]  HPI Mr. Veals is a pleasant 45 yo female patient with a well established diagnosis of bilateral knee severe DJD and obesity, her BMI is 64. She'll be hav for pain relief ing bilateral knee pain for the last 2 years on off. In both knees the pain is located in the medial aspect of her knees, sharp,  5/10 in intensity, no radiated, worse with weightbearing activities, improved by rest. She is coming today seeking the possibility of bilateral cortisone injections. X-rays of both knees from 2010 were reviewed which showed bilateral medial severe DJD in both knees  Patient Active Problem List  Diagnoses  . GLUCOSE INTOLERANCE  . MORBID OBESITY  . OBESITY HYPOVENTILATION SYNDROME  . DEPRESSIVE DISORDER, NOS  . OBSTRUCTIVE SLEEP APNEA  . CARPAL TUNNEL SYNDROME, BILATERAL  . MYASTHENIA  . HYPERTENSION, BENIGN SYSTEMIC  . ALLERGIC RHINITIS  . VOCAL CORD DISORDER  . Unspecified Asthma  . POSTINFLAMMATORY PULMONARY FIBROSIS  . GASTROESOPHAGEAL REFLUX, NO ESOPHAGITIS  . PILONIDAL CYST  . DERMATITIS, SCALP  . SYMPTOM, INCONTINENCE, MIXED, URGE/STRESS  . Knee pain, bilateral  . DJD (degenerative joint disease) of knee  . Lichen simplex chronicus  . Asthma flare  . Sarcoid   Current Outpatient Prescriptions on File Prior to Visit  Medication Sig Dispense Refill  . albuterol (PROVENTIL HFA) 108 (90 BASE) MCG/ACT inhaler Inhale 2 puffs into the lungs every 4 (four) hours as needed. 1-2 puffs every 4hours as needed  1 each  3  . budesonide-formoterol (SYMBICORT) 160-4.5 MCG/ACT inhaler Inhale 2 puffs into the lungs 2 (two) times daily. 2 puffs by mouth two times a day DISP: 1HFA  1 Inhaler  3  . cetirizine (ZYRTEC) 10 MG tablet Take 1 tablet (10 mg total) by mouth daily.  30 tablet  3  . cloNIDine (CATAPRES - DOSED IN MG/24 HR) 0.1 mg/24hr patch APPLY 1 PATCH ONTO SKIN EVERY 7 DAYS.  4 patch  2  .  DULoxetine (CYMBALTA) 60 MG capsule Take 1 capsule (60 mg total) by mouth daily.  30 capsule  3  . HYDROcodone-acetaminophen (LORTAB) 7.5-500 MG per tablet Take 1 tablet by mouth every 8 (eight) hours as needed for pain.  60 tablet  0  . lisinopril-hydrochlorothiazide (PRINZIDE,ZESTORETIC) 20-12.5 MG per tablet Take 1 tablet by mouth 2 (two) times daily.        . medroxyPROGESTERone (DEPO-PROVERA) 150 MG/ML injection Inject 150 mg into the muscle every 3 (three) months.        . montelukast (SINGULAIR) 10 MG tablet Take 1 tablet (10 mg total) by mouth daily. 1 tab once daily  30 tablet  3  . Multiple Vitamin (MULTIVITAMIN) capsule Take 1 capsule by mouth daily.        Marland Kitchen omeprazole (PRILOSEC) 40 MG capsule ONE BY MOUTH TWO TIMES A DAY  60 capsule  1  . oxybutynin (DITROPAN) 5 MG tablet Take 1 tablet (5 mg total) by mouth 2 (two) times daily. Patient takes 1 tablet by mouth two times a day  60 tablet  2  . POLYETHYLENE GLYCOL 3350 PO Take by mouth daily. Patient takes 17g daily        No Known Allergies    Review of Systems  Constitutional: Negative for fever, chills, diaphoresis and fatigue.  Musculoskeletal: Positive for joint swelling, arthralgias and gait problem. Negative for back pain.  Neurological: Negative  for weakness and numbness.       Objective:   Physical Exam  Constitutional: She appears well-developed and well-nourished.       BP 152/96   Pulmonary/Chest: Effort normal.  Musculoskeletal:       Right knee with intact skin, FROM. Patellofemoral crepitus present with flexion and extension. Patellofemoral compression  test +. No tenderness on the quad neither or patellar tendon. TTP in the mid joint line Ligaments intact. Lachman neg. Varus and valgus test at 0 and 30 degres neg Poor quad muscle definition.  Gait assisted with a cane.  Left knee with intact skin, FROM. Patellofemoral crepitus present with flexion and extension. Patellofemoral compression  test +. No  tenderness on the quad neither or patellar tendon. TTP in the mid joint line Ligaments intact. Lachman neg. Varus and valgus test at 0 and 30 degres neg Poor quad muscle definition.  Gait assisted with a cane.  Neurological: She is alert.  Skin: Skin is warm. No rash noted. No erythema.  Psychiatric: She has a normal mood and affect. Her behavior is normal.    After obtaining consent, the skin of the anterior medial aspect of both knees was sterilely prepped with alcohol swabs, ethyl will chloride was used for local topical anesthesia injection of 6 mL 1% lidocaine plus one mL of Kenalog was injected intraarticular in both knees under U/S guidance. The procedure was well-tolerated by the patient. Patient instructed to remain in clinic for 20 minutes afterwards, and to report any adverse reaction to me immediately.        Assessment & Plan:   1. DJD (degenerative joint disease) of knee  DG Knee AP/LAT W/Sunrise Left, DG Knee AP/LAT W/Sunrise Right  B/L 1/6 intraarticular cortisone injections. Standing knees X rays Pending F/U PRN

## 2011-01-08 NOTE — Progress Notes (Signed)
  Subjective:    Patient ID: [name], female    DOB: [birth year], 45 y.o.   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 States that she needs to use her albuterol daily or every other day up to >4x per day. Reports taking her Symbicort once daily; forgets the AM dose; has taken it twice daily in the past. Pt states that she has not used any albuterol today.   Medication Review: Pt reports receiving steroid injections at the sports medicine clinic every three months in both knees. Pt states she does not like taking prednisone due to swelling in her face and legs with a previous hospital visit to remove 2.5 gallons of fluid.    Review of Systems     Objective:   Physical Exam Ht 5\' 3"  (1.6 m)  Wt 362 lb 3.2 oz (164.293 kg)  BMI 64.16 kg/m2  Please see the documentation flow sheet for PFT results.        Assessment & Plan:  Spirometry evaluation with Pre-Bronchodilator reveals near normal lung function.  Patient has been experiencing asthma symptoms and taking albuterol and Symbicort (once daily). Continue current treatment plan at this time.  Counseled pt to increase Symbicort to twice daily administration to improve control of asthma and decrease albuterol use.  Reviewed results of pulmonary function tests.  Pt verbalized understanding of results. Written pt instructions provided.  F/U Clinic visit with Dr. Mikel Cella.  Total time in face to face counseling 25 minutes.  Patient seen with Maudry Mayhew, Pharmacy Resident.

## 2011-01-08 NOTE — Patient Instructions (Signed)
Thank you for coming in today.  Your lung function test shows that your lung function is normal for your age and height.  Please continue to follow up with your primary care physician, Dr. Mikel Cella. We will send your results to her.

## 2011-01-11 ENCOUNTER — Ambulatory Visit: Payer: Medicaid Other | Admitting: Family Medicine

## 2011-01-16 ENCOUNTER — Other Ambulatory Visit: Payer: Self-pay | Admitting: Family Medicine

## 2011-01-20 NOTE — Telephone Encounter (Signed)
Refill request

## 2011-01-21 ENCOUNTER — Ambulatory Visit: Payer: Medicaid Other | Admitting: Family Medicine

## 2011-01-24 ENCOUNTER — Other Ambulatory Visit: Payer: Self-pay | Admitting: Family Medicine

## 2011-02-04 ENCOUNTER — Encounter: Payer: Self-pay | Admitting: Family Medicine

## 2011-02-04 ENCOUNTER — Ambulatory Visit: Payer: Medicaid Other | Admitting: Family Medicine

## 2011-02-04 ENCOUNTER — Ambulatory Visit (INDEPENDENT_AMBULATORY_CARE_PROVIDER_SITE_OTHER): Payer: Medicaid Other | Admitting: Family Medicine

## 2011-02-04 DIAGNOSIS — M549 Dorsalgia, unspecified: Secondary | ICD-10-CM

## 2011-02-04 DIAGNOSIS — IMO0002 Reserved for concepts with insufficient information to code with codable children: Secondary | ICD-10-CM

## 2011-02-04 DIAGNOSIS — M1710 Unilateral primary osteoarthritis, unspecified knee: Secondary | ICD-10-CM

## 2011-02-04 DIAGNOSIS — D869 Sarcoidosis, unspecified: Secondary | ICD-10-CM

## 2011-02-04 DIAGNOSIS — J45901 Unspecified asthma with (acute) exacerbation: Secondary | ICD-10-CM

## 2011-02-04 DIAGNOSIS — J45909 Unspecified asthma, uncomplicated: Secondary | ICD-10-CM

## 2011-02-04 DIAGNOSIS — M179 Osteoarthritis of knee, unspecified: Secondary | ICD-10-CM

## 2011-02-04 DIAGNOSIS — I10 Essential (primary) hypertension: Secondary | ICD-10-CM

## 2011-02-04 MED ORDER — CLONIDINE HCL 0.1 MG/24HR TD PTWK: 1.0000 | MEDICATED_PATCH | TRANSDERMAL | Status: DC

## 2011-02-04 MED ORDER — ALBUTEROL SULFATE HFA 108 (90 BASE) MCG/ACT IN AERS: 2.0000 | INHALATION_SPRAY | RESPIRATORY_TRACT | Status: DC | PRN

## 2011-02-04 MED ORDER — PREDNISONE 10 MG PO KIT: 10.0000 mg | PACK | Freq: Every day | ORAL | Status: DC

## 2011-02-04 MED ORDER — HYDROCODONE-ACETAMINOPHEN 7.5-500 MG PO TABS: 1.0000 | ORAL_TABLET | Freq: Three times a day (TID) | ORAL | Status: DC | PRN

## 2011-02-04 MED ORDER — BUDESONIDE-FORMOTEROL FUMARATE 160-4.5 MCG/ACT IN AERO: 2.0000 | INHALATION_SPRAY | Freq: Two times a day (BID) | RESPIRATORY_TRACT | Status: DC

## 2011-02-04 NOTE — Progress Notes (Signed)
Subjective:     Patient ID: [name], female   DOB: 11-Aug-1965, 46 y.o.   MRN: [id number]  HPI Patient is a 46 yo F presenting to the office for a follow-up appointment.  1. Asthma flare: Patient states she has had URI like symptoms 2 weeks ago and now has "junk in her chest." She has had 2 recent asthma exacerbations that required steroids. At this time, no fevers/chills but does report productive cough and frequent Albuterol use. She is asking for another steroid Rx to help clear it up. She commonly has asthma exacerbations this time of year, but feels these have been more frequent this year. She does not know of any triggers. She had a recent CXR that was unremarkable, as well as PFT's which were relatively normal. Patient has coughing and SOB mostly at night.  2. Sarcoid: Recent diagnosis. CXR shows improvement of hilar LAD, normal PFT, unremarkable eye exam and negative TB test. At this point in time, her skin lesions are most bothersome to her. Systemic steroids would be the best treatment, but patient is not willing or ready to go on chronic steroid therapy at this time. 3. Knee pain: Being followed by Sports Med. Had bilateral knee injections in December which helped. She is asking for refill of her pain medication. Will have follow-up at Sports Med. 4. Medication refills: Patient needs refills of her Clonidine as well as inhalers.  Review of Systems Please see HPI No cp, abd pain. No fevers, chills, headaches Complains of SOB, knee pain.     Objective:   Physical Exam  Constitutional: She appears well-developed and well-nourished. No distress.  HENT:  Head: Normocephalic and atraumatic.  Eyes: Pupils are equal, round, and reactive to light.  Cardiovascular: Normal rate, regular rhythm and normal heart sounds.   Pulmonary/Chest: Effort normal. No respiratory distress. She has wheezes (Diffuse expiratory wheezes). She has no rales.  Abdominal: Soft. There is no tenderness.    Obese  Musculoskeletal: She exhibits no edema.  Lymphadenopathy:    She has no cervical adenopathy.  Skin:       Hyperpigmented macular lesions prominent on cheeks bilaterally.       Assessment:     46 yo F presenting for follow-up, having acute flare of asthma and needs medication refills    Plan:

## 2011-02-04 NOTE — Assessment & Plan Note (Signed)
Recent steroid injection of knees. Will continue to see Sports Medicine. For now, refill Hydrocodone #60/0R. Patient has been seeing me consistently for many months now. She has a pain contract with me. Her next appointment is in 3 months. I will refill her pain medication before her next visit, if she needs it.

## 2011-02-04 NOTE — Patient Instructions (Signed)
It was nice to see you today!  I am glad all of your tests were good!  I have sent a prednisone dose pack to the pharmacy for you to take for the next 12 days.  Continue taking your inhalers.  The website for the glasses I use is [email or web address]  Come back to see me in 2-3 months, or sooner if needed!  Also, congratulations on your weight loss! Keep it up!!  Rilla Buckman M. Roxsana Riding, M.D.

## 2011-02-04 NOTE — Assessment & Plan Note (Signed)
Currently on Symbicort 2 puffs BID as well as Albuterol as needed (every 2-4 hours.) Will refill these today and encouraged her to use them consistently. Also, sent 12 day [facility] to take. This will be her 3rd steroid burst in the last 3 months. PFTs normal. Will continue to monitor, but consider changing inhaler regimen if she continues to have flares.

## 2011-02-04 NOTE — Assessment & Plan Note (Signed)
Labs are unremarkable. CXR improved/stable. PFT wnl. I am happy patient had these tests and we have a baseline for her. At this time, I do not see an indication for starting chronic steroids daily. Patient agrees. Her skin lesions are bothering her the most and systemic is really the only way to treat these. If she wants to go on daily prednisone, we can discuss that.

## 2011-03-25 ENCOUNTER — Other Ambulatory Visit: Payer: Self-pay | Admitting: Family Medicine

## 2011-03-25 NOTE — Telephone Encounter (Signed)
Pt is asking for refill on her Hydrocodone - she has an appt on Monday for her depo and would like to pick up then...pls call when ready

## 2011-03-25 NOTE — Telephone Encounter (Signed)
Will forward message to Dr. Mikel Cella.

## 2011-03-25 NOTE — Telephone Encounter (Signed)
Refill request

## 2011-03-27 ENCOUNTER — Other Ambulatory Visit: Payer: Self-pay | Admitting: Family Medicine

## 2011-03-27 DIAGNOSIS — M549 Dorsalgia, unspecified: Secondary | ICD-10-CM

## 2011-03-29 ENCOUNTER — Other Ambulatory Visit: Payer: Self-pay | Admitting: Family Medicine

## 2011-03-29 ENCOUNTER — Ambulatory Visit: Payer: Medicaid Other

## 2011-03-29 DIAGNOSIS — R32 Unspecified urinary incontinence: Secondary | ICD-10-CM

## 2011-03-29 MED ORDER — OXYBUTYNIN CHLORIDE 5 MG PO TABS: 5.0000 mg | ORAL_TABLET | Freq: Two times a day (BID) | ORAL | Status: DC

## 2011-03-29 MED ORDER — HYDROCODONE-ACETAMINOPHEN 7.5-500 MG PO TABS: 1.0000 | ORAL_TABLET | Freq: Three times a day (TID) | ORAL | Status: DC | PRN

## 2011-03-29 NOTE — Telephone Encounter (Signed)
Rx has been printed. It is at the front desk for her to pick up at her appointment today. Please let her know.  Thank you! Sharanda Shinault M. Braxtyn Bojarski, M.D.

## 2011-03-29 NOTE — Telephone Encounter (Signed)
Message left on voicemail that rx is ready to pick up

## 2011-03-31 ENCOUNTER — Ambulatory Visit (INDEPENDENT_AMBULATORY_CARE_PROVIDER_SITE_OTHER): Payer: Medicaid Other | Admitting: *Deleted

## 2011-03-31 DIAGNOSIS — Z309 Encounter for contraceptive management, unspecified: Secondary | ICD-10-CM

## 2011-03-31 MED ORDER — MEDROXYPROGESTERONE ACETATE 150 MG/ML IM SUSP
150.0000 mg | Freq: Once | INTRAMUSCULAR | Status: AC
  Administered 2011-03-31: 150 mg via INTRAMUSCULAR

## 2011-03-31 NOTE — Progress Notes (Addendum)
Received request from [facility] regarding home oxygen order.  They request resting pulse ox  on room air and with  exertion on room air.  Pulse ox at rest 96 % on room air.  Patient then walked around office for 5 minutes and pulse ox dropped to 94 % at one point but mostly stayed at 95 %. This was on room air .Marland Kitchen Will send message to Dr. Mikel Cella about ordering overnight oximetry test as requested by [facility] if patient did not meet qualifiers while in office.   Request placed in MD box.   Patient states that she really doesn't need the oxygen and told [facility] they can pick it up but they are not able to just stop the oxygen without MD order and must verify that patient doesn't require.

## 2011-04-05 ENCOUNTER — Telehealth: Payer: Self-pay | Admitting: Family Medicine

## 2011-04-05 NOTE — Telephone Encounter (Signed)
[name] from [facility] is calling for O2 stats to be faxed to [phone].

## 2011-04-06 ENCOUNTER — Telehealth: Payer: Self-pay | Admitting: *Deleted

## 2011-04-06 NOTE — Telephone Encounter (Signed)
I agree. I will be available to sign tomorrow. Please place in mailbox. Thank you!

## 2011-04-06 NOTE — Telephone Encounter (Signed)
Approval received for Symbicort. Pharmacy notified.

## 2011-04-06 NOTE — Telephone Encounter (Signed)
Its there [name], [name]

## 2011-04-06 NOTE — Telephone Encounter (Signed)
Form filled out and placed in to be faxed box.  

## 2011-04-06 NOTE — Telephone Encounter (Signed)
PA request for Symbicort. Form placed in intern to do box.

## 2011-04-06 NOTE — Telephone Encounter (Signed)
LMOVM of [name] for call back.  The notes that we have show that pt does not qualify for oxygen per Arnoldo Hooker note.  Is that what she wants?

## 2011-04-06 NOTE — Telephone Encounter (Signed)
Informed [name] of [facility] note.  She will fax pretyped order for MD to sign if she is agreeable. Paulina Muchmore, Maryjo Rochester

## 2011-04-26 ENCOUNTER — Ambulatory Visit (INDEPENDENT_AMBULATORY_CARE_PROVIDER_SITE_OTHER): Payer: Medicaid Other | Admitting: Family Medicine

## 2011-04-26 VITALS — BP 136/92

## 2011-04-26 DIAGNOSIS — M65311 Trigger thumb, right thumb: Secondary | ICD-10-CM

## 2011-04-26 DIAGNOSIS — M179 Osteoarthritis of knee, unspecified: Secondary | ICD-10-CM

## 2011-04-26 DIAGNOSIS — M1710 Unilateral primary osteoarthritis, unspecified knee: Secondary | ICD-10-CM

## 2011-04-26 DIAGNOSIS — J45901 Unspecified asthma with (acute) exacerbation: Secondary | ICD-10-CM

## 2011-04-26 DIAGNOSIS — M6530 Trigger finger, unspecified finger: Secondary | ICD-10-CM

## 2011-04-26 DIAGNOSIS — IMO0002 Reserved for concepts with insufficient information to code with codable children: Secondary | ICD-10-CM

## 2011-04-26 DIAGNOSIS — G5600 Carpal tunnel syndrome, unspecified upper limb: Secondary | ICD-10-CM

## 2011-04-26 MED ORDER — AZITHROMYCIN 250 MG PO TABS: ORAL_TABLET | ORAL | Status: DC

## 2011-04-26 MED ORDER — PREDNISONE 50 MG PO TABS: ORAL_TABLET | ORAL | Status: DC

## 2011-04-27 ENCOUNTER — Encounter: Payer: Self-pay | Admitting: Family Medicine

## 2011-04-27 DIAGNOSIS — M65311 Trigger thumb, right thumb: Secondary | ICD-10-CM | POA: Insufficient documentation

## 2011-04-27 NOTE — Progress Notes (Signed)
  Subjective:    Patient ID: [name], female    DOB: 10-14-1965, 46 y.o.   MRN: [id number]  HPI #1. Bilateral knee pain. Has long history of severe osteoarthritis bilateral knees. Was seen here about a half months ago and had bilateral corticosteroid injections which really seem to help. They only helped for about 8 weeks however. Then the pain returned. She is currently experiencing 8-9/10 pain most of the day. Worse with standing, any attempt at walking or climbing stairs. She's not had any fever and not noted any warmth of the knees. They have not seemed to swell. The pain is located in the knee joint and it is aching in quality. It does not radiate to the lower leg. #2. Has long history of carpal tunnel symptoms on the right wrist. These have been getting worse in the last 2 months. She's now having difficulty doing her ADLs. She is right-hand dominant. She has numbness and tingling in the thumb and first 2 fingers. The hand feels occasionally weak. She wakes up with hand  Numbness unless she wears her wrist splint at night. She is also started wearing it during the day but that does not seem to improve her symptoms much. #3. Right thumb seems to be quite sticking". Noted this about 2 weeks ago. No particular injury. Quite aggravating and concerns her but is not painful. #4. Cough productive of green sputum for 7 days. No fever. Hx of asthma and has had to use her albuterol inhaler 4-5 times a day last 3-4 days.   Review of Systems Pertinent review of systems: negative for fever or unusual weight change. See HPI for additional ROS    Objective:   Physical Exam  Vital signs reviewed. GENERAL: Morbid obesity, no acute distress LUNGS: expiratory wheeze bilaterally esp at bases. No increased WOB KNEES: B ttp medial joint line. No erythema or warmth. Full extension and flexion. Calves are soft. WRIST: right. Positive phalen at 20 seconds. Very slight flattening of thenar eminence. Normal  grip strength.  THUMB: right. TTP over flexor tendons with some slight sticking on flexion.  INJECTION: Patient was given informed consent, signed copy in the chart. Appropriate time out was taken. Area prepped and draped in usual sterile fashion. 1 cc of methylprednisolone 40 mg/ml plus  4 cc of 1% lidocaine without epinephrine was injected into the Bilateral knees  using a(n) anterior medial approach. The patient tolerated the procedure well. There were no complications. Post procedure instructions were given.  IMAGING:from 01/08/2012: 3 view knees ; I  reviewed her knee films with her. She has medial compartment bone on bone,    Assessment & Plan:  1. Severe OA knees. B CSI today Briefly discussed weight loss. I will send a message to her PCP to see if they have discussed bariatric surgery. 2. Apparent  Carpal tunnel syndrome on right with beginning trigger thumb. As she is having trouble with her ADLs (and mother of 2 young children) I wil refer her to ortho for eval/potential surgery CTS. Hopefully they can also look at her trigger thumb as it is on same hand. 3. Asthma exacerbation with bronchitis---really over using her albuterol. Prednisone burst and z pak. F/u PCP regarding this.

## 2011-04-28 ENCOUNTER — Other Ambulatory Visit: Payer: Self-pay | Admitting: Family Medicine

## 2011-04-29 ENCOUNTER — Ambulatory Visit (INDEPENDENT_AMBULATORY_CARE_PROVIDER_SITE_OTHER): Payer: Medicaid Other | Admitting: Family Medicine

## 2011-04-29 VITALS — BP 163/118 | HR 99 | Temp 99.8°F | Ht 63.0 in | Wt 361.0 lb

## 2011-04-29 DIAGNOSIS — J45901 Unspecified asthma with (acute) exacerbation: Secondary | ICD-10-CM

## 2011-04-29 DIAGNOSIS — M549 Dorsalgia, unspecified: Secondary | ICD-10-CM

## 2011-04-29 MED ORDER — PREDNISONE 10 MG PO TABS: 10.0000 mg | ORAL_TABLET | Freq: Every day | ORAL | Status: DC

## 2011-04-29 MED ORDER — HYDROCODONE-ACETAMINOPHEN 7.5-500 MG PO TABS: 1.0000 | ORAL_TABLET | Freq: Three times a day (TID) | ORAL | Status: DC | PRN

## 2011-04-29 MED ORDER — GUAIFENESIN-CODEINE 100-10 MG/5ML PO SYRP: 5.0000 mL | ORAL_SOLUTION | Freq: Three times a day (TID) | ORAL | Status: DC | PRN

## 2011-04-29 NOTE — Assessment & Plan Note (Signed)
Patient currently on azithromycin and prednisone 50 mg daily with improvement of her breathing but she continues to have cough and some sputum production. At this time, patient is not interested and chest x-ray and/or hospital admission. She has been afebrile and she states that this is most likely related to the weather and viral illness. She is requesting a longer term oral steroid use. Given her lung function as well as her myasthenia gravis and sarcoidosis, will start patient on 10 mg prednisone daily at least for now. Discussed with patient that she cannot stop taken steroids suddenly and that we should discuss it when she is interested in coming off of steroids, even though this is a low dose. For her lung function, patient to finish antibiotics. Also given prescription for Robitussin with codeine to help with cough. She did not have any focal findings on lung exam to day although if she does not improve she should be reevaluated next week. The patient is not better in one week we will do a chest x-ray since patient could easily develop a pneumonia. Patient given red flag symptoms that should prompt her evaluation in the emergency department.

## 2011-04-29 NOTE — Patient Instructions (Signed)
It was good to see you today. I am sorry you are not feeling well.   If you are not better by next week, please give [place] a call. We will do a chest X-ray and check you out again.   [facility]   Take care! Terecia Plaut M. Ruben Pyka, M.D.

## 2011-04-29 NOTE — Progress Notes (Signed)
Subjective:     Patient ID: [name], female   DOB: 1965-03-03, 46 y.o.   MRN: [id number]  HPI Patient is a 46 year old female presenting today for an asthma flare. Patient states she was seen by Dr. Lloyd Huger 4 days ago for similar complaints. Associated given azithromycin x5 days as well as prednisone for 5 days. She states that her breathing is somewhat improved but she continues to have increased cough and sputum production. She states that her 4 children have had many viral illnesses and she feels like this is contributing to her sickness. Also the allergy season is here and that is making her asthma worse. Patient states that she is not coughing up anything that is bloody or dark green. She states that she coughs anything up it is bubbly and white. Patient has been taking her antibiotics and steroids. She is requesting something for cough. She is also interested in a longer steroid use. She has been on long-term steroids in the past for her myasthenia gravis, and she has adamantly said that she would never take long-term steroids again. She states the given her current condition today she would rather be on steroids at least for a few months. She denies any headaches, changes in vision or chest pain. She does endorse cough, shortness of breath, and bilateral knee pain.  Review of Systems  Constitutional: Negative for fever and chills.  HENT: Negative for congestion.   Respiratory: Positive for cough, shortness of breath and wheezing.   Cardiovascular: Negative for chest pain.  Gastrointestinal: Negative for abdominal pain.  Musculoskeletal: Positive for myalgias, joint swelling, arthralgias and gait problem.       Objective:   Physical Exam  Constitutional: She appears well-developed and well-nourished. No distress.       Coughing  HENT:  Head: Normocephalic and atraumatic.  Cardiovascular: Normal rate and regular rhythm.   Pulmonary/Chest: Effort normal. No respiratory distress. She  has wheezes. She has rales.  Abdominal: Soft. There is no tenderness.  Lymphadenopathy:    She has no cervical adenopathy.       Assessment:     46 yo F presenting for increased cough and SOB  Plan:

## 2011-05-10 ENCOUNTER — Encounter: Payer: Self-pay | Admitting: *Deleted

## 2011-05-10 NOTE — Progress Notes (Signed)
Patient ID: [name], female   DOB: August 15, 1965, 46 y.o.   MRN: [id number]  Dr. Stark Jock office no longer accepts Medicaid.  Scheduled pt for a consultation with Dr. Ave Filter @ [facility] on 05/17/11 at 10 am.  Pt notified of appt info today via phone.

## 2011-05-17 ENCOUNTER — Encounter (HOSPITAL_COMMUNITY): Payer: Self-pay | Admitting: Pharmacy Technician

## 2011-05-17 ENCOUNTER — Other Ambulatory Visit: Payer: Self-pay | Admitting: Orthopedic Surgery

## 2011-05-18 ENCOUNTER — Encounter (HOSPITAL_COMMUNITY): Payer: Self-pay

## 2011-05-18 ENCOUNTER — Encounter (HOSPITAL_COMMUNITY)
Admission: RE | Admit: 2011-05-18 | Discharge: 2011-05-18 | Disposition: A | Discharge status: Home / Self Care | Payer: Medicaid Other | Source: Ambulatory Visit | Attending: Orthopedic Surgery | Admitting: Orthopedic Surgery

## 2011-05-18 HISTORY — DX: Insomnia, unspecified: G47.00

## 2011-05-18 HISTORY — DX: Dermatitis, unspecified: L30.9

## 2011-05-18 HISTORY — DX: Anxiety disorder, unspecified: F41.9

## 2011-05-18 HISTORY — DX: Dizziness and giddiness: R42

## 2011-05-18 HISTORY — DX: Unspecified osteoarthritis, unspecified site: M19.90

## 2011-05-18 HISTORY — DX: Nocturia: R35.1

## 2011-05-18 HISTORY — DX: Dorsalgia, unspecified: M54.9

## 2011-05-18 HISTORY — DX: Urgency of urination: R39.15

## 2011-05-18 HISTORY — DX: Rheumatoid arthritis, unspecified: M06.9

## 2011-05-18 HISTORY — DX: Effusion, unspecified joint: M25.40

## 2011-05-18 HISTORY — DX: Other chronic pain: G89.29

## 2011-05-18 HISTORY — DX: Pneumonia, unspecified organism: J18.9

## 2011-05-18 HISTORY — DX: Bronchitis, not specified as acute or chronic: J40

## 2011-05-18 HISTORY — DX: Other skin changes: R23.8

## 2011-05-18 HISTORY — DX: Essential (primary) hypertension: I10

## 2011-05-18 HISTORY — DX: Peptic ulcer, site unspecified, unspecified as acute or chronic, without hemorrhage or perforation: K27.9

## 2011-05-18 HISTORY — DX: Carpal tunnel syndrome, unspecified upper limb: G56.00

## 2011-05-18 HISTORY — DX: Frequency of micturition: R35.0

## 2011-05-18 HISTORY — DX: Headache: R51

## 2011-05-18 HISTORY — DX: Pain in unspecified joint: M25.50

## 2011-05-18 HISTORY — DX: Shortness of breath: R06.02

## 2011-05-18 HISTORY — DX: Other disorders of lung: J98.4

## 2011-05-18 HISTORY — DX: Trigger finger, unspecified finger: M65.30

## 2011-05-18 LAB — CBC
MCV: 83.7 fL (ref 78.0–100.0)
Platelets: 247 10*3/uL (ref 150–400)
RDW: 15.1 % (ref 11.5–15.5)
WBC: 8.2 10*3/uL (ref 4.0–10.5)

## 2011-05-18 LAB — BASIC METABOLIC PANEL
Calcium: 9.3 mg/dL (ref 8.4–10.5)
Creatinine, Ser: 0.67 mg/dL (ref 0.50–1.10)
GFR calc Af Amer: >90 mL/min (ref >90)
GFR calc non Af Amer: >90 mL/min (ref >90)

## 2011-05-18 LAB — SURGICAL PCR SCREEN
MRSA, PCR: NEGATIVE
Staphylococcus aureus: NEGATIVE

## 2011-05-18 NOTE — Progress Notes (Signed)
Sleep study results in epic from 10/2007

## 2011-05-18 NOTE — Pre-Procedure Instructions (Signed)
20 [name]  05/18/2011   Your procedure is scheduled on:  Thurs, April 25 @ 0915  Report to [facility] at 0715 AM.  Call this number if you have problems the morning of surgery: [phone]   Remember:   Do not eat food:After Midnight.  May have clear liquids: up to 4 Hours before arrival.(until 3;15 am)  Clear liquids include soda, tea, black coffee, apple or grape juice, broth,water  Take these medicines the morning of surgery with A SIP OF WATER: Omeprazole,SIngulair,Cymbalta,Zyrtec,Pain Pill(if needed),Symbicort,and Albuterol<<Bring Your Inhaler With You>>   Do not wear jewelry, make-up or nail polish.  Do not wear lotions, powders, or perfumes.   Do not shave 48 hours prior to surgery.  Do not bring valuables to the hospital.  Contacts, dentures or bridgework may not be worn into surgery.  Leave suitcase in the car. After surgery it may be brought to your room.  For patients admitted to the hospital, checkout time is 11:00 AM the day of discharge.   Patients discharged the day of surgery will not be allowed to drive home.  Special Instructions: CHG Shower Use Special Wash: 1/2 bottle night before surgery and 1/2 bottle morning of surgery.   Please read over the following fact sheets that you were given: Pain Booklet, Coughing and Deep Breathing, MRSA Information and Surgical Site Infection Prevention

## 2011-05-18 NOTE — Progress Notes (Addendum)
Pt doesn't have a cardiologist  Denies ever having a heart cath/stress test  Echo results in epic from 10/2007  Medical Md is Dr.Hairford with [facility]

## 2011-05-19 ENCOUNTER — Other Ambulatory Visit (HOSPITAL_COMMUNITY): Payer: Medicaid Other

## 2011-05-19 MED ORDER — CEFAZOLIN SODIUM-DEXTROSE 2-3 GM-% IV SOLR
2.0000 g | INTRAVENOUS | Status: AC
  Administered 2011-05-20: 2 g via INTRAVENOUS
  Filled 2011-05-19: qty 50

## 2011-05-20 ENCOUNTER — Encounter (HOSPITAL_COMMUNITY)
Admission: RE | Disposition: A | Discharge status: Home / Self Care | Payer: Self-pay | Source: Ambulatory Visit | Attending: Orthopedic Surgery

## 2011-05-20 ENCOUNTER — Ambulatory Visit (HOSPITAL_COMMUNITY)
Admission: RE | Admit: 2011-05-20 | Discharge: 2011-05-20 | Disposition: A | Discharge status: Home / Self Care | Payer: Medicaid Other | Source: Ambulatory Visit | Attending: Orthopedic Surgery | Admitting: Orthopedic Surgery

## 2011-05-20 ENCOUNTER — Ambulatory Visit (HOSPITAL_COMMUNITY): Payer: Medicaid Other | Admitting: Anesthesiology

## 2011-05-20 ENCOUNTER — Encounter (HOSPITAL_COMMUNITY): Payer: Self-pay | Admitting: Anesthesiology

## 2011-05-20 ENCOUNTER — Encounter (HOSPITAL_COMMUNITY): Payer: Self-pay | Admitting: *Deleted

## 2011-05-20 DIAGNOSIS — M069 Rheumatoid arthritis, unspecified: Secondary | ICD-10-CM | POA: Insufficient documentation

## 2011-05-20 DIAGNOSIS — Z8711 Personal history of peptic ulcer disease: Secondary | ICD-10-CM | POA: Insufficient documentation

## 2011-05-20 DIAGNOSIS — J45909 Unspecified asthma, uncomplicated: Secondary | ICD-10-CM | POA: Insufficient documentation

## 2011-05-20 DIAGNOSIS — M6530 Trigger finger, unspecified finger: Secondary | ICD-10-CM | POA: Insufficient documentation

## 2011-05-20 DIAGNOSIS — K219 Gastro-esophageal reflux disease without esophagitis: Secondary | ICD-10-CM | POA: Insufficient documentation

## 2011-05-20 DIAGNOSIS — Z01810 Encounter for preprocedural cardiovascular examination: Secondary | ICD-10-CM | POA: Insufficient documentation

## 2011-05-20 DIAGNOSIS — R51 Headache: Secondary | ICD-10-CM | POA: Insufficient documentation

## 2011-05-20 DIAGNOSIS — Z01812 Encounter for preprocedural laboratory examination: Secondary | ICD-10-CM | POA: Insufficient documentation

## 2011-05-20 DIAGNOSIS — G7000 Myasthenia gravis without (acute) exacerbation: Secondary | ICD-10-CM | POA: Insufficient documentation

## 2011-05-20 DIAGNOSIS — F3289 Other specified depressive episodes: Secondary | ICD-10-CM | POA: Insufficient documentation

## 2011-05-20 DIAGNOSIS — I10 Essential (primary) hypertension: Secondary | ICD-10-CM | POA: Insufficient documentation

## 2011-05-20 DIAGNOSIS — G5600 Carpal tunnel syndrome, unspecified upper limb: Secondary | ICD-10-CM | POA: Insufficient documentation

## 2011-05-20 DIAGNOSIS — F329 Major depressive disorder, single episode, unspecified: Secondary | ICD-10-CM | POA: Insufficient documentation

## 2011-05-20 DIAGNOSIS — M1990 Unspecified osteoarthritis, unspecified site: Secondary | ICD-10-CM | POA: Insufficient documentation

## 2011-05-20 DIAGNOSIS — G4733 Obstructive sleep apnea (adult) (pediatric): Secondary | ICD-10-CM | POA: Insufficient documentation

## 2011-05-20 DIAGNOSIS — F411 Generalized anxiety disorder: Secondary | ICD-10-CM | POA: Insufficient documentation

## 2011-05-20 HISTORY — PX: TRIGGER FINGER RELEASE: SHX641

## 2011-05-20 HISTORY — PX: CARPAL TUNNEL RELEASE: SHX101

## 2011-05-20 SURGERY — RELEASE, A1 PULLEY, FOR TRIGGER FINGER
Anesthesia: Monitor Anesthesia Care | Site: Finger | Laterality: Right | Wound class: Clean

## 2011-05-20 MED ORDER — BUPIVACAINE-EPINEPHRINE 0.25% -1:200000 IJ SOLN
INTRAMUSCULAR | Status: DC | PRN
  Administered 2011-05-20: 7 mL

## 2011-05-20 MED ORDER — MIDAZOLAM HCL 5 MG/5ML IJ SOLN
INTRAMUSCULAR | Status: DC | PRN
  Administered 2011-05-20 (×2): 1 mg via INTRAVENOUS

## 2011-05-20 MED ORDER — BUPIVACAINE HCL (PF) 0.25 % IJ SOLN
INTRAMUSCULAR | Status: DC | PRN
  Administered 2011-05-20: 4 mL

## 2011-05-20 MED ORDER — FENTANYL CITRATE 0.05 MG/ML IJ SOLN
INTRAMUSCULAR | Status: DC | PRN
  Administered 2011-05-20: 25 ug via INTRAVENOUS
  Administered 2011-05-20: 50 ug via INTRAVENOUS
  Administered 2011-05-20: 25 ug via INTRAVENOUS

## 2011-05-20 MED ORDER — DROPERIDOL 2.5 MG/ML IJ SOLN: 0.6250 mg | INTRAMUSCULAR | Status: DC | PRN

## 2011-05-20 MED ORDER — HYDROMORPHONE HCL PF 1 MG/ML IJ SOLN: 0.2500 mg | INTRAMUSCULAR | Status: DC | PRN

## 2011-05-20 MED ORDER — OXYCODONE-ACETAMINOPHEN 5-325 MG PO TABS: 1.0000 | ORAL_TABLET | ORAL | Status: AC | PRN

## 2011-05-20 MED ORDER — 0.9 % SODIUM CHLORIDE (POUR BTL) OPTIME
TOPICAL | Status: DC | PRN
  Administered 2011-05-20: 1000 mL

## 2011-05-20 MED ORDER — LACTATED RINGERS IV SOLN
INTRAVENOUS | Status: DC | PRN
  Administered 2011-05-20: 09:00:00 via INTRAVENOUS

## 2011-05-20 MED ORDER — POVIDONE-IODINE 7.5 % EX SOLN: Freq: Once | CUTANEOUS | Status: DC

## 2011-05-20 SURGICAL SUPPLY — 44 items
BANDAGE COBAN STERILE 2 (GAUZE/BANDAGES/DRESSINGS) ×2 IMPLANT
BANDAGE ELASTIC 3 VELCRO ST LF (GAUZE/BANDAGES/DRESSINGS) ×3 IMPLANT
BANDAGE ELASTIC 4 VELCRO ST LF (GAUZE/BANDAGES/DRESSINGS) ×3 IMPLANT
BANDAGE GAUZE 4  KLING STR (GAUZE/BANDAGES/DRESSINGS) ×2 IMPLANT
BANDAGE GAUZE ELAST BULKY 4 IN (GAUZE/BANDAGES/DRESSINGS) ×3 IMPLANT
BLADE SURG ROTATE 9660 (MISCELLANEOUS) IMPLANT
BNDG CMPR 9X4 STRL LF SNTH (GAUZE/BANDAGES/DRESSINGS)
BNDG ESMARK 4X9 LF (GAUZE/BANDAGES/DRESSINGS) ×2 IMPLANT
CLOTH BEACON ORANGE TIMEOUT ST (SAFETY) ×3 IMPLANT
COVER SURGICAL LIGHT HANDLE (MISCELLANEOUS) ×3 IMPLANT
CUFF TOURNIQUET SINGLE 18IN (TOURNIQUET CUFF) ×3 IMPLANT
CUFF TOURNIQUET SINGLE 24IN (TOURNIQUET CUFF) IMPLANT
DRAPE OEC MINIVIEW 54X84 (DRAPES) IMPLANT
DRAPE U-SHAPE 47X51 STRL (DRAPES) ×2 IMPLANT
ELECT REM PT RETURN 9FT ADLT (ELECTROSURGICAL)
ELECTRODE REM PT RTRN 9FT ADLT (ELECTROSURGICAL) IMPLANT
GAUZE XEROFORM 1X8 LF (GAUZE/BANDAGES/DRESSINGS) ×2 IMPLANT
GAUZE XEROFORM 5X9 LF (GAUZE/BANDAGES/DRESSINGS) ×2 IMPLANT
GLOVE BIO SURGEON STRL SZ7 (GLOVE) ×3 IMPLANT
GLOVE BIO SURGEON STRL SZ7.5 (GLOVE) ×2 IMPLANT
GLOVE BIOGEL PI IND STRL 8 (GLOVE) ×2 IMPLANT
GLOVE BIOGEL PI INDICATOR 8 (GLOVE) ×1
GLOVE SURG SS PI 7.5 STRL IVOR (GLOVE) ×1 IMPLANT
GOWN PREVENTION PLUS LG XLONG (DISPOSABLE) ×3 IMPLANT
GOWN PREVENTION PLUS XLARGE (GOWN DISPOSABLE) ×3 IMPLANT
GOWN STRL NON-REIN LRG LVL3 (GOWN DISPOSABLE) ×9 IMPLANT
KIT BASIN OR (CUSTOM PROCEDURE TRAY) ×3 IMPLANT
KIT ROOM TURNOVER OR (KITS) ×3 IMPLANT
MANIFOLD NEPTUNE II (INSTRUMENTS) ×2 IMPLANT
NEEDLE 22X1 1/2 (OR ONLY) (NEEDLE) ×1 IMPLANT
NS IRRIG 1000ML POUR BTL (IV SOLUTION) ×3 IMPLANT
PACK ORTHO EXTREMITY (CUSTOM PROCEDURE TRAY) ×3 IMPLANT
PAD ARMBOARD 7.5X6 YLW CONV (MISCELLANEOUS) ×6 IMPLANT
PAD CAST 4YDX4 CTTN HI CHSV (CAST SUPPLIES) ×2 IMPLANT
PADDING CAST COTTON 4X4 STRL (CAST SUPPLIES)
SPONGE GAUZE 4X4 12PLY (GAUZE/BANDAGES/DRESSINGS) ×3 IMPLANT
SPONGE LAP 4X18 X RAY DECT (DISPOSABLE) ×2 IMPLANT
SUCTION FRAZIER TIP 10 FR DISP (SUCTIONS) ×2 IMPLANT
SUT ETHILON 4 0 PS 2 18 (SUTURE) ×3 IMPLANT
SYR CONTROL 10ML LL (SYRINGE) ×2 IMPLANT
TOWEL OR 17X24 6PK STRL BLUE (TOWEL DISPOSABLE) ×3 IMPLANT
TOWEL OR 17X26 10 PK STRL BLUE (TOWEL DISPOSABLE) ×3 IMPLANT
TUBE CONNECTING 12X1/4 (SUCTIONS) ×3 IMPLANT
WATER STERILE IRR 1000ML POUR (IV SOLUTION) ×2 IMPLANT

## 2011-05-20 NOTE — Preoperative (Signed)
Beta Blockers   Reason not to administer Beta Blockers:Not Applicable 

## 2011-05-20 NOTE — Transfer of Care (Signed)
Immediate Anesthesia Transfer of Care Note  Patient: [name]  Procedure(s) Performed: Procedure(s) (LRB): RELEASE TRIGGER FINGER/A-1 PULLEY (Right) CARPAL TUNNEL RELEASE (Right)  Patient Location: PACU  Anesthesia Type: Bier block  Level of Consciousness: awake, alert  and oriented  Airway & Oxygen Therapy: Patient Spontanous Breathing and Patient connected to nasal cannula oxygen  Post-op Assessment: Report given to PACU RN and Post -op Vital signs reviewed and stable  Post vital signs: Reviewed and stable  Complications: No apparent anesthesia complications

## 2011-05-20 NOTE — H&P (Signed)
[name] is an 46 y.o. female.   Chief Complaint: R trigger thumb and hand numbness HPI: R trigger thumb and CTS failed nonop treatment  Past Medical History  Diagnosis Date  . Myasthenia gravis 1994    Positive Acetylcholine receptor Ab and single fiber EMG Dr. Clarisa [facility]- Dr. Noreene Filbert 1998, Dr Sharene Skeans 2008 [facility]- Failed prednisone due to weight gain, stopped imuran/mestinon due to finances- Now with quiescent disease per Dr Sharene Skeans and no flares in many years  . 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 Coralyn Helling- 10/30/2007 ANA positive, ANA titer  negative, RF less than 20  . ALLERGIC RHINITIS     takes Zyrtec daily  . OSA (obstructive sleep apnea)     RDI 10 on PSG 10/09  . Hypoventilation associated with obesity syndrome   . Pelvic inflammatory disease (PID)   . Viral meningitis     history of viral meningitis  . Irritable bladder   . Hypertension     takes Prinizide daily and Clonidine on Mondays  . Asthma     Albuterol prn;Symbicort daily and Singulair daily  . Shortness of breath     can be sitting as well as exertion  . Pneumonia     hx of;last time about 1-67yrs ago  . Bronchitis     hx of  . Headache     couple of times a week  . Dizziness     pt states she gets off balance occasionally  . Arthritis   . Joint pain   . Joint swelling   . Myasthenia gravis   . Chronic back pain   . Osteoarthritis   . Rheumatoid arthritis   . Carpal tunnel syndrome     right  . Eczema   . Trigger finger   . GERD (gastroesophageal reflux disease)     takes Omeprazole daily  . Peptic ulcer   . Constipation     Miralax prn  . Hemorrhoids   . Urinary frequency     takes Ditropan daily  . Urinary urgency   . Nocturia   . Anxiety   . Depression     takes Cymbalta daily  . Insomnia   . Lung disease     scleraderma  . Skin irritation     skin itchy    Past Surgical History  Procedure Date  .  Cesarean section 09/15/2004  . Bronchoscopy 08/2007  . Thymus resection 08/25/1993  . Laparoscopic cholecystectomy 07/2008    by Dr. Cyndia Bent  . Cortisone injection     receives an injection every 3months  . Esophagogastroduodenoscopy     Family History  Problem Relation Age of Onset  . Hypertension Father   . Sickle cell trait Father   . Stroke Paternal Grandmother   . Sickle cell trait Paternal Grandfather   . Lupus    . Anesthesia problems Neg Hx   . Hypotension Neg Hx   . Malignant hyperthermia Neg Hx   . Pseudochol deficiency Neg Hx    Social History:  reports that she has never smoked. She does not have any smokeless tobacco history on file. She reports that she does not drink alcohol or use illicit drugs.  Allergies:  Allergies  Allergen Reactions  . Apple     "mouth itch"  . Banana     "whelps on tongue"  . Shrimp (Shellfish Allergy) Nausea And Vomiting    Medications  Prior to Admission  Medication Sig Dispense Refill  . albuterol (PROVENTIL HFA) 108 (90 BASE) MCG/ACT inhaler Inhale 2 puffs into the lungs every 4 (four) hours as needed for wheezing.  1 Inhaler  5  . budesonide-formoterol (SYMBICORT) 160-4.5 MCG/ACT inhaler Inhale 2 puffs into the lungs 2 (two) times daily. 2 puffs by mouth two times a day DISP: 1HFA  1 Inhaler  5  . cetirizine (ZYRTEC) 10 MG tablet Take 1 tablet (10 mg total) by mouth daily.  30 tablet  3  . cloNIDine (CATAPRES - DOSED IN MG/24 HR) 0.1 mg/24hr patch Place 1 patch onto the skin once a week. On monday      . DULoxetine (CYMBALTA) 60 MG capsule Take 1 capsule (60 mg total) by mouth daily.  30 capsule  3  . HYDROcodone-acetaminophen (LORTAB) 7.5-500 MG per tablet Take 1 tablet by mouth every 8 (eight) hours as needed for pain.  60 tablet  0  . lisinopril-hydrochlorothiazide (PRINZIDE,ZESTORETIC) 20-12.5 MG per tablet Take 1 tablet by mouth 2 (two) times daily.        . montelukast (SINGULAIR) 10 MG tablet Take 1 tablet (10 mg  total) by mouth daily. 1 tab once daily  30 tablet  3  . omeprazole (PRILOSEC) 40 MG capsule Take 80 mg by mouth daily.      Marland Kitchen oxybutynin (DITROPAN) 5 MG tablet Take 1 tablet (5 mg total) by mouth 2 (two) times daily. Patient takes 1 tablet by mouth two times a day  60 tablet  2  . POLYETHYLENE GLYCOL 3350 PO Take by mouth daily. Patient takes 17g daily       . predniSONE (DELTASONE) 10 MG tablet Take 1 tablet (10 mg total) by mouth daily.  90 tablet  1  . medroxyPROGESTERone (DEPO-PROVERA) 150 MG/ML injection Inject 150 mg into the muscle every 3 (three) months.         Results for orders placed during the hospital encounter of 05/18/11 (from the past 48 hour(s))  SURGICAL PCR SCREEN     Status: Normal   Collection Time   05/18/11  2:03 PM      Component Value Range Comment   MRSA, PCR NEGATIVE  NEGATIVE     Staphylococcus aureus NEGATIVE  NEGATIVE    BASIC METABOLIC PANEL     Status: Abnormal   Collection Time   05/18/11  2:30 PM      Component Value Range Comment   Sodium 141  135 - 145 (mEq/L)    Potassium 3.8  3.5 - 5.1 (mEq/L)    Chloride 109  96 - 112 (mEq/L)    CO2 23  19 - 32 (mEq/L)    Glucose, Bld 143 (*) 70 - 99 (mg/dL)    BUN 8  6 - 23 (mg/dL)    Creatinine, Ser 7.25  0.50 - 1.10 (mg/dL)    Calcium 9.3  8.4 - 10.5 (mg/dL)    GFR calc non Af Amer >90  >90 (mL/min)    GFR calc Af Amer >90  >90 (mL/min)   CBC     Status: Normal   Collection Time   05/18/11  2:30 PM      Component Value Range Comment   WBC 8.2  4.0 - 10.5 (K/uL)    RBC 4.73  3.87 - 5.11 (MIL/uL)    Hemoglobin 13.2  12.0 - 15.0 (g/dL)    HCT 36.6  44.0 - 34.7 (%)    MCV 83.7  78.0 - 100.0 (  fL)    MCH 27.9  26.0 - 34.0 (pg)    MCHC 33.3  30.0 - 36.0 (g/dL)    RDW 16.1  09.6 - 04.5 (%)    Platelets 247  150 - 400 (K/uL)   HCG, SERUM, QUALITATIVE     Status: Normal   Collection Time   05/18/11  2:30 PM      Component Value Range Comment   Preg, Serum NEGATIVE  NEGATIVE     No results found.  Review  of Systems  All other systems reviewed and are negative.    Blood pressure 138/90, pulse 88, temperature 99.2 F (37.3 C), temperature source Oral, resp. rate 20, SpO2 93.00%. Physical Exam  Constitutional: She is oriented to person, place, and time. She appears well-developed and well-nourished.  HENT:  Head: Atraumatic.  Eyes: EOM are normal.  Cardiovascular: Intact distal pulses.   Respiratory: Effort normal.  GI: Soft.  Musculoskeletal:       + tinels r wrist + trigger thumb R  Neurological: She is alert and oriented to person, place, and time.  Skin: Skin is warm and dry.  Psychiatric: She has a normal mood and affect.     Assessment/Plan R trigger thumb/CTS Plan releases Risks / benefits of surgery discussed Consent on chart  NPO for OR Preop antibiotics   [name] WILLIAM 05/20/2011, 9:15 AM

## 2011-05-20 NOTE — Anesthesia Postprocedure Evaluation (Signed)
Anesthesia Post Note  Patient: [name]  Procedure(s) Performed: Procedure(s) (LRB): RELEASE TRIGGER FINGER/A-1 PULLEY (Right) CARPAL TUNNEL RELEASE (Right)  Anesthesia type: MAC  Patient location: PACU  Post pain: Pain level controlled  Post assessment: Patient's Cardiovascular Status Stable  Last Vitals:  Filed Vitals:   05/20/11 1045  BP:   Pulse:   Temp: 36.2 C  Resp:     Post vital signs: Reviewed and stable  Level of consciousness: sedated  Complications: No apparent anesthesia complications

## 2011-05-20 NOTE — Discharge Instructions (Signed)
Discharge Instructions after Carpal Tunnel Release   You will have a light dressing on your hand.  You may begin gentle motion of your fingers and hand immediately, but you should not do any heavy lifting or gripping.  Elevate your hand for the first 48 hours after surgery. Pain medicine has been prescribed for you.  Use your medicine as needed over the first 48 hours, and then you can begin to taper your use. You may take Extra Strength Tylenol or Tylenol only in place of the pain pills.  Leave the dressing in place until the third day after your surgery and then remove it and place a band-aid over the stitches.  After the bandage has been removed you may shower, but do not soak the incision.  You may drive a car when you are off of prescription pain medications and can safely control your vehicle with both hands.   Please call [phone] during normal business hours or [phone] after hours for any problems. Including the following:  - excessive redness of the incisions - drainage for more than 4 days - fever of more than 101.5 F  *Please note that pain medications will not be refilled after hours or on weekends.   

## 2011-05-20 NOTE — Op Note (Signed)
Procedure(s): RELEASE TRIGGER FINGER/A-1 PULLEY CARPAL TUNNEL RELEASE Procedure Note  [name] female 46 y.o. 05/20/2011  Procedure(s) and Anesthesia Type:    * RELEASE TRIGGER FINGER/A-1 PULLEY - Regional    * CARPAL TUNNEL RELEASE - Choice  Surgeon(s) and Role:    * [name], MD - Primary   Indications:  46 y.o. female with right carpal tunnel syndrome and right trigger thumb. She has failed conservative management for many years and wished to have carpal total release. Given the associated trigger thumb we felt it best to address both issues at the time of surgery.     Surgeon: [name]   Assistants: Damita Lack PA-S.  Anesthesia: Local block    Procedure Detail  RELEASE TRIGGER FINGER/A-1 PULLEY, CARPAL TUNNEL RELEASE  Findings: The transcarpal ligament was noted to be severely thickened. It was completely released and the underlying nerve the slightly flattened was healthy-appearing. The A1 pulley of the thumb was completely released. She was able to actively flex and extend the thumb procedure with no triggering.  Estimated Blood Loss:  Minimal         Drains: none  Blood Given: none         Specimens: none        Complications:  * No complications entered in OR log *  Tourniquet time: 53 min at 250 mmHg         Disposition: PACU - hemodynamically stable.         Condition: stable    Procedure: The patient was brought to the operating room and was placed supine on the operative table.  Local block anesthesia was used.  The tourniquet was applied by the anesthesiologist and elevated to 300 mm of mercury prior to the block procedure. T The right wrist and hand was then prepped and draped in the standard sterile fashion. A 3 cm incision was made in line with the web space between the third and fourth ray extending distally from the flexion crease of the wrist. Dissection was carried down through subcutaneous fat to  the palmar fascia which was opened longitudinally in line with the incision. Underlying muscle was swept off the transcarpal ligament and a small rent was made in the ligament with a 15 blade. A Freer elevator was then inserted proximally and distally to protect the contents of the carpal canal while a 15 blade was used under direct visualization to open proximally and distally. The proximal and distal extents of the transcarpal ligament were then carefully exposed and under direct visualization completely divided using tenotomy scissors. Great care was taken to protect the underlying nerve and distally the palmar arch. The transcarpal ligament was noted to be very thickened.  At the conclusion of the procedure the free edges of the transcarpal ligament or widely separated.  Attention was then turned to the trigger thumb. An oblique incision was made directly over the flexion crease over the A1 pulley. Dissection was carried down carefully spreading longitudinally to carefully protect the neurovascular structures. The A1 pulley was identified and exposed. An 11 blade was then used to longitudinally split the A1 pulley completely. A Glorious Peach was used to bring the tendon out through the wound and demonstrate the A1 completely was released. I then asked the patient to flex and extend her thumb and there is no triggering.  The wounds were copiously irrigated with normal saline and subsequently closed in one layer with 4-0 nylon in an interrupted fashion.  Sterile dressings  were applied including Adaptic 4 x 4's Kling dressing and a lightly wrapped Coban dressing. The tourniquet was let down. The fingers were pink and warm.  The patient was then awakened transferred to a stretcher and taken to the recovery room in stable condition.  Postoperative plan: Patient will be discharged home today and will followup in 10 days for suture removal and wound check.

## 2011-05-20 NOTE — Anesthesia Preprocedure Evaluation (Addendum)
Anesthesia Evaluation  Patient identified by MRN, date of birth, ID band Patient awake    Reviewed: Allergy & Precautions, H&P , NPO status , Patient's Chart, lab work & pertinent test results  Airway Mallampati: II TM Distance: >3 FB     Dental  (+) Teeth Intact and Dental Advisory Given   Pulmonary shortness of breath and with exertion, asthma , sleep apnea and Continuous Positive Airway Pressure Ventilation , pneumonia ,  breath sounds clear to auscultation  Pulmonary exam normal       Cardiovascular hypertension, Pt. on medications Rhythm:Regular Rate:Normal     Neuro/Psych  Headaches, Anxiety Depression  Neuromuscular disease    GI/Hepatic Neg liver ROS, PUD, GERD-  Medicated and Controlled,  Endo/Other  Morbid obesity  Renal/GU negative Renal ROS     Musculoskeletal  (+) Arthritis -, Rheumatoid disorders and on steriods ,    Abdominal   Peds  Hematology   Anesthesia Other Findings   Reproductive/Obstetrics                          Anesthesia Physical Anesthesia Plan  ASA: III  Anesthesia Plan: MAC and Bier Block   Post-op Pain Management:    Induction: Intravenous  Airway Management Planned: Mask  Additional Equipment:   Intra-op Plan:   Post-operative Plan:   Informed Consent: I have reviewed the patients History and Physical, chart, labs and discussed the procedure including the risks, benefits and alternatives for the proposed anesthesia with the patient or authorized representative who has indicated his/her understanding and acceptance.   Dental advisory given  Plan Discussed with: Anesthesiologist, Surgeon and CRNA  Anesthesia Plan Comments:       Anesthesia Quick Evaluation

## 2011-05-21 ENCOUNTER — Encounter (HOSPITAL_COMMUNITY): Payer: Self-pay | Admitting: Orthopedic Surgery

## 2011-05-31 ENCOUNTER — Telehealth: Payer: Self-pay | Admitting: Family Medicine

## 2011-05-31 DIAGNOSIS — M549 Dorsalgia, unspecified: Secondary | ICD-10-CM

## 2011-05-31 NOTE — Telephone Encounter (Signed)
Need rx written for oxycodone.  Call patient when ready.

## 2011-06-02 MED ORDER — HYDROCODONE-ACETAMINOPHEN 7.5-500 MG PO TABS: 1.0000 | ORAL_TABLET | Freq: Three times a day (TID) | ORAL | Status: DC | PRN

## 2011-06-02 NOTE — Telephone Encounter (Signed)
Pt informed. Xan Sparkman Dawn  

## 2011-06-02 NOTE — Telephone Encounter (Signed)
She is on Hydrocodone, which has been refilled.  She can pick it up at the front desk. Please let her know. Thank you!

## 2011-06-30 ENCOUNTER — Ambulatory Visit (INDEPENDENT_AMBULATORY_CARE_PROVIDER_SITE_OTHER): Payer: Medicaid Other | Admitting: *Deleted

## 2011-06-30 DIAGNOSIS — Z309 Encounter for contraceptive management, unspecified: Secondary | ICD-10-CM

## 2011-06-30 MED ORDER — MEDROXYPROGESTERONE ACETATE 150 MG/ML IM SUSP
150.0000 mg | Freq: Once | INTRAMUSCULAR | Status: AC
  Administered 2011-06-30: 150 mg via INTRAMUSCULAR

## 2011-07-14 ENCOUNTER — Other Ambulatory Visit: Payer: Self-pay | Admitting: Family Medicine

## 2011-07-14 DIAGNOSIS — M549 Dorsalgia, unspecified: Secondary | ICD-10-CM

## 2011-07-14 MED ORDER — HYDROCODONE-ACETAMINOPHEN 7.5-500 MG PO TABS: 1.0000 | ORAL_TABLET | Freq: Three times a day (TID) | ORAL | Status: DC | PRN

## 2011-07-14 NOTE — Telephone Encounter (Signed)
Patient is calling because she will be bringing another patient up for an appointment this afternoon and would like it if the Rx for her pain medication can be written so that she can pick it up at that time.

## 2011-07-14 NOTE — Telephone Encounter (Signed)
Rx completed and placed in box for her to pick up.  Denisse Whitenack M. Macarena Langseth, M.D.

## 2011-08-04 ENCOUNTER — Encounter: Payer: Self-pay | Admitting: Family Medicine

## 2011-08-04 ENCOUNTER — Ambulatory Visit (INDEPENDENT_AMBULATORY_CARE_PROVIDER_SITE_OTHER): Payer: Medicaid Other | Admitting: Family Medicine

## 2011-08-04 VITALS — BP 177/84 | HR 106 | Temp 98.9°F | Ht 63.0 in | Wt 368.0 lb

## 2011-08-04 DIAGNOSIS — M549 Dorsalgia, unspecified: Secondary | ICD-10-CM

## 2011-08-04 DIAGNOSIS — M1710 Unilateral primary osteoarthritis, unspecified knee: Secondary | ICD-10-CM

## 2011-08-04 DIAGNOSIS — IMO0002 Reserved for concepts with insufficient information to code with codable children: Secondary | ICD-10-CM

## 2011-08-04 DIAGNOSIS — I10 Essential (primary) hypertension: Secondary | ICD-10-CM

## 2011-08-04 DIAGNOSIS — M179 Osteoarthritis of knee, unspecified: Secondary | ICD-10-CM

## 2011-08-04 MED ORDER — CLONIDINE HCL 0.2 MG/24HR TD PTWK: 1.0000 | MEDICATED_PATCH | TRANSDERMAL | Status: DC

## 2011-08-04 MED ORDER — HYDROCODONE-ACETAMINOPHEN 7.5-500 MG PO TABS: 1.0000 | ORAL_TABLET | Freq: Three times a day (TID) | ORAL | Status: DC | PRN

## 2011-08-04 NOTE — Patient Instructions (Signed)
It was great to see you today!  I increased the dose of your clonidine. Continue the patch and the Lisinopril since your blood pressure was high today. I will put in a referral for [facility]ariatric Center. You can schedule a sports medicine appointment, if you want.  See you in 3 months, or sooner if needed. Amber M. Hairford, M.D.

## 2011-08-04 NOTE — Progress Notes (Signed)
Subjective:     Patient ID: [name], female   DOB: Jan 15, 1966, 46 y.o.   MRN: [id number]  HPI 46 yo F follow-up appointment  1. HTN- BP elevated today, but previously well controlled. She uses her Clonidine patch daily, but does not always take Lisinopril as prescribed. She denies HA, changes in vision, CP, SOB.   2. Weight- Patient is concerned with her weight. Since her hand surgery, she feels like she is ready for knee replacement surgery. Her biggest barrier to that is her weight. She has been to seen dietician and has tried to exercise. She states her pain and size limits her ability to exercise. She also states that she knows she has poor eating habits. She does not eat a "true meal" until dinner and then she does not eat healthy. She has pre-made meals for her sons, but she does not like to eat those. She snacks throughout the day and states she has little willpower. She knows her size is limiting her ability to take care of her children and enjoy her life. She would like to know about options to weight loss. We discussed regular appointments with me, visiting Arlys John (wellness coach) and bariatric center for education. Her BMI is 65.3.  3. S/p hand surgery- Pt had right hand carpal tunnel repair and trigger thumb release. Surgery went very well and she is extremely pleased with the results. She states she has not had any pain or issues since the surgery. Incisions have healed well with no signs of infection. Follow up with hand surgeon today.   4. DJD of knees- remains stable. No concerns today. Continues to take pain medication as needed. She states she needs a cortisone injection soon, also she is considering knee replacement surgery now  History reviewed- Non-smoker  Review of Systems See HPI above    Objective:   Physical Exam  Constitutional: She appears well-developed and well-nourished. No distress.  Cardiovascular: Normal rate, regular rhythm and normal heart  sounds.   No murmur heard. Pulmonary/Chest: Effort normal and breath sounds normal. She has no wheezes.       Midline scar  Abdominal: Soft. There is no tenderness.       Obese  Musculoskeletal: She exhibits no edema.       Right hand with well healed scar on wrist and at base of thumb. No redness or tenderness. Decreased ROM of lower extremities secondary to body habitus. Walks with cane.   Assessment:     46 yo F follow-up appointment    Plan:

## 2011-08-04 NOTE — Assessment & Plan Note (Signed)
BP elevated today. Patient requesting increased dose of Clonidine patch. Will increase to 0.2, but encourage her to continue to take Lisinopril although she states she does not remember to do that. Will continue to monitor BP. Return to clinic in 3 months.

## 2011-08-04 NOTE — Assessment & Plan Note (Addendum)
Patient states it is time for her to return to sports med clinic for injections. Continue to encourage her to lose weight. Refilled Lortab today.

## 2011-08-04 NOTE — Assessment & Plan Note (Signed)
Patient would prefer to be seen by Bariatrics to discuss education and weight loss options. Phentermine is not an option due to her comorbidities. Also, she states driving to see me or Rosalita Chessman on a regular basis will be too cumbersome. Encouraged her to make better food choices, and exercise as much as possible. Will place referral for bariatrics, and patient will return in 3 months to be evaluated.

## 2011-09-06 ENCOUNTER — Telehealth: Payer: Self-pay | Admitting: *Deleted

## 2011-09-06 DIAGNOSIS — M549 Dorsalgia, unspecified: Secondary | ICD-10-CM

## 2011-09-06 NOTE — Telephone Encounter (Signed)
Pt was here with her children today.  She ask that I send a message to Dr. Mikel Cella asking if she could refill her pain meds.  Advised I would send a message to MD about this, she states that she has appt on Friday @ [facility] and would love to be able to pick it up then. [name], Maryjo Rochester

## 2011-09-07 MED ORDER — HYDROCODONE-ACETAMINOPHEN 7.5-500 MG PO TABS: 1.0000 | ORAL_TABLET | Freq: Three times a day (TID) | ORAL | Status: DC | PRN

## 2011-09-07 NOTE — Telephone Encounter (Signed)
Refilled. She can pick up at front desk on Friday. Please let patient know. Thank you! Alaa Mullally M. Arthur Aydelotte, M.D.

## 2011-09-07 NOTE — Telephone Encounter (Signed)
Pt informed. [name], [name]  

## 2011-09-10 ENCOUNTER — Ambulatory Visit (INDEPENDENT_AMBULATORY_CARE_PROVIDER_SITE_OTHER): Payer: Medicaid Other | Admitting: Family Medicine

## 2011-09-10 ENCOUNTER — Encounter: Payer: Self-pay | Admitting: Family Medicine

## 2011-09-10 VITALS — BP 171/108 | HR 75 | Ht 63.0 in | Wt 368.0 lb

## 2011-09-10 DIAGNOSIS — M1710 Unilateral primary osteoarthritis, unspecified knee: Secondary | ICD-10-CM

## 2011-09-10 DIAGNOSIS — M25561 Pain in right knee: Secondary | ICD-10-CM

## 2011-09-10 DIAGNOSIS — IMO0002 Reserved for concepts with insufficient information to code with codable children: Secondary | ICD-10-CM

## 2011-09-10 DIAGNOSIS — M179 Osteoarthritis of knee, unspecified: Secondary | ICD-10-CM

## 2011-09-10 DIAGNOSIS — G5600 Carpal tunnel syndrome, unspecified upper limb: Secondary | ICD-10-CM

## 2011-09-10 DIAGNOSIS — M25569 Pain in unspecified knee: Secondary | ICD-10-CM

## 2011-09-12 NOTE — Progress Notes (Signed)
  Subjective:    Patient ID: [name], female    DOB: 1965-12-04, 46 y.o.   MRN: [id number]  HPI B knee pain getting worse. CSI helps her. Pain now 8/10 right and 7/10 left. Would like B CSI.   Review of Systems Pertinent review of systems: negative for fever or unusual weight change.     Objective:   Physical Exam  Vital signs reviewed. GENERAL: Well developed, well nourished, no acute distress. .morbid obesity KNEES B  ttp medial joint line. FROM flexion and extension. B calves are soft Distally NV intact  INJECTION: Patient was given informed consent, signed copy in the chart. Appropriate time out was taken. Area prepped and draped in usual sterile fashion. 1 cc of methylprednisolone 40 mg/ml plus  4 cc of 1% lidocaine without epinephrine was injected into the each knee using a(n) anterior medial approach. The patient tolerated the procedure well. There were no complications. Post procedure instructions were given.        Assessment & Plan:

## 2011-09-12 NOTE — Assessment & Plan Note (Signed)
Right surgery 20113

## 2011-09-12 NOTE — Assessment & Plan Note (Signed)
S/p carpal tunnel surgert 2013

## 2011-09-17 ENCOUNTER — Ambulatory Visit (INDEPENDENT_AMBULATORY_CARE_PROVIDER_SITE_OTHER): Payer: Medicaid Other | Admitting: *Deleted

## 2011-09-17 DIAGNOSIS — Z309 Encounter for contraceptive management, unspecified: Secondary | ICD-10-CM

## 2011-09-17 MED ORDER — MEDROXYPROGESTERONE ACETATE 150 MG/ML IM SUSP
150.0000 mg | Freq: Once | INTRAMUSCULAR | Status: AC
  Administered 2011-09-17: 150 mg via INTRAMUSCULAR

## 2011-10-01 ENCOUNTER — Other Ambulatory Visit: Payer: Self-pay | Admitting: Family Medicine

## 2011-11-01 ENCOUNTER — Ambulatory Visit (INDEPENDENT_AMBULATORY_CARE_PROVIDER_SITE_OTHER): Payer: Medicaid Other | Admitting: Family Medicine

## 2011-11-01 ENCOUNTER — Encounter: Payer: Self-pay | Admitting: Family Medicine

## 2011-11-01 VITALS — BP 180/100 | HR 85 | Temp 98.8°F | Ht 63.0 in | Wt 380.0 lb

## 2011-11-01 DIAGNOSIS — F329 Major depressive disorder, single episode, unspecified: Secondary | ICD-10-CM

## 2011-11-01 DIAGNOSIS — M549 Dorsalgia, unspecified: Secondary | ICD-10-CM

## 2011-11-01 DIAGNOSIS — F3289 Other specified depressive episodes: Secondary | ICD-10-CM

## 2011-11-01 DIAGNOSIS — I10 Essential (primary) hypertension: Secondary | ICD-10-CM

## 2011-11-01 DIAGNOSIS — Z23 Encounter for immunization: Secondary | ICD-10-CM

## 2011-11-01 DIAGNOSIS — R51 Headache: Secondary | ICD-10-CM

## 2011-11-01 MED ORDER — PROMETHAZINE HCL 25 MG PO TABS: 25.0000 mg | ORAL_TABLET | Freq: Three times a day (TID) | ORAL | Status: DC | PRN

## 2011-11-01 MED ORDER — HYDROCODONE-ACETAMINOPHEN 7.5-500 MG PO TABS: 1.0000 | ORAL_TABLET | Freq: Three times a day (TID) | ORAL | Status: DC | PRN

## 2011-11-01 MED ORDER — KETOROLAC TROMETHAMINE 30 MG/ML IJ SOLN
30.0000 mg | Freq: Once | INTRAMUSCULAR | Status: AC
  Administered 2011-11-01: 30 mg via INTRAMUSCULAR

## 2011-11-01 NOTE — Patient Instructions (Signed)
It was good to see you today! I am sorry things are not going well for you.  Please try to take your blood pressure medicine. You can also take Excedrin as needed for your headache. I have sent in a new prescription for Phenergan for nausea. It will make you sleepy!   Please come back to see me if your headache gets worse.  See you soon!  Amber M. Hairford, M.D.

## 2011-11-02 DIAGNOSIS — R51 Headache: Secondary | ICD-10-CM | POA: Insufficient documentation

## 2011-11-02 DIAGNOSIS — R519 Headache, unspecified: Secondary | ICD-10-CM | POA: Insufficient documentation

## 2011-11-02 NOTE — Assessment & Plan Note (Signed)
Pt is stressed over social situations that are beyond her control. Unsure if she is taking her Cymbalta. Encouraged her to take care of her children and focus on her own health. Gave her number for [employer].

## 2011-11-02 NOTE — Assessment & Plan Note (Signed)
Uncontrolled. Continue clonidine patch. Also, encouraged patient to take Lisinopril that has been prescribed. Patient does not like to take medications but I have asked her to try to check her BP at home and let me know how it is. RTC in one month, or sooner if needed.

## 2011-11-02 NOTE — Assessment & Plan Note (Signed)
Most likely secondary to hypertension in my opinion. Her stress is increasing her blood pressure, which is causing headaches and vision changes. Will work on controlling blood pressure. Toradol 30mg  IM given in clinic today and tolerated well. Will continue Excedrin as needed for headaches. If HA continues for greater than 1 week, she will call the clinic.

## 2011-11-02 NOTE — Progress Notes (Signed)
Patient ID: [name], female   DOB: [birth year], 46 y.o.   MRN: [id number] [facility] Fenna Semel M. Natalio Salois, MD Phone: [phone]   Subjective: HPI: Patient is a 46 y.o. female presenting to clinic today for follow up appointment. Concerns today include headaches, stress.  1. Headaches- Patient has been experiencing frontal headaches 2-3 times per week for the last several weeks. She feels it is stress related (see below) but it is now starting to interfere with her daily life. She take excedrin on occasion but she does not like to take medication. She is on Vicodin for chronic pain as well which does not help. Her BP has been greatly elevated. She does use her Clonidine patch, but it does not bring her to goal. She also states she has "floaters" in her eyes from time to time associated with the headaches. No photophobia, no N/V, abd pain, neck pain. Headache resolves with rest. She does not have a history of headaches.  2. Stress- Patient has a lot of stress currently with taking care of 4 boys under the age of 65 and now she is not receiving her food stamps. She is unable to feed the kids, and is having to take money from her bills to buy food. She has tried to get help but she does not live in [place] city limits so she is not given as many resources. She has been to food banks as well, but still is not enough. This stress causes her to be upset and give her headaches. She denies SI/HI. She appears to be handling it well, but would appreciate any help she can get.  3. HTN- Blood pressure above goal today. She has clonidine patch on. She does not take Lisinopril, or any other medications. She does not check her BP at home. She tolerates Clonidine well, but does have headaches as mentioned above. Some vision changes associated with HTN, no CP, Abd pain, edema.   History Reviewed: Non smoker. Health Maintenance: Needs flu shot today  ROS: Please see HPI  above.  Objective: Office vital signs reviewed.  Physical Examination:  General: Awake, alert. NAD. Appears more stressed than usual, but not tearful or upset HEENT: Atraumatic, normocephalic Neck: No masses palpated. No LAD Pulm: CTAB, no wheezes Cardio: RRR, no murmurs appreciated Abdomen: Morbidly obese, +BS, soft, nontender, nondistended Extremities: No edema. Knees TTP bilaterally Neuro: Grossly intact. Walks with cane  Assessment: 46 yo F follow up appointment  Plan: See Problem List and After Visit Summary

## 2011-11-04 ENCOUNTER — Ambulatory Visit: Payer: Medicaid Other | Admitting: Family Medicine

## 2011-11-19 ENCOUNTER — Other Ambulatory Visit: Payer: Self-pay | Admitting: Family Medicine

## 2011-11-19 ENCOUNTER — Telehealth: Payer: Self-pay | Admitting: *Deleted

## 2011-11-19 NOTE — Telephone Encounter (Signed)
PA required for montelukast. Form placed in MD box. 

## 2011-11-22 NOTE — Telephone Encounter (Signed)
PA completed and given back to Sheridan, Charity fundraiser. Thanks, [employer]. Sumner Boesch, M.D.

## 2011-11-23 NOTE — Telephone Encounter (Signed)
Approval received   Pharmacy notified

## 2011-12-07 ENCOUNTER — Ambulatory Visit: Payer: Medicaid Other

## 2011-12-09 ENCOUNTER — Ambulatory Visit (INDEPENDENT_AMBULATORY_CARE_PROVIDER_SITE_OTHER): Payer: Medicaid Other | Admitting: *Deleted

## 2011-12-09 DIAGNOSIS — Z309 Encounter for contraceptive management, unspecified: Secondary | ICD-10-CM

## 2011-12-09 MED ORDER — MEDROXYPROGESTERONE ACETATE 150 MG/ML IM SUSP
150.0000 mg | Freq: Once | INTRAMUSCULAR | Status: AC
  Administered 2011-12-09: 150 mg via INTRAMUSCULAR

## 2011-12-09 NOTE — Progress Notes (Signed)
Next depo due Jan 30 through Mar 09, 2012

## 2012-01-04 ENCOUNTER — Other Ambulatory Visit: Payer: Self-pay | Admitting: Family Medicine

## 2012-02-10 ENCOUNTER — Other Ambulatory Visit: Payer: Self-pay | Admitting: Family Medicine

## 2012-02-11 ENCOUNTER — Other Ambulatory Visit: Payer: Self-pay | Admitting: *Deleted

## 2012-02-11 DIAGNOSIS — M549 Dorsalgia, unspecified: Secondary | ICD-10-CM

## 2012-02-11 MED ORDER — HYDROCODONE-ACETAMINOPHEN 7.5-500 MG PO TABS: 1.0000 | ORAL_TABLET | Freq: Three times a day (TID) | ORAL | Status: DC | PRN

## 2012-02-15 ENCOUNTER — Other Ambulatory Visit: Payer: Self-pay | Admitting: Family Medicine

## 2012-02-15 DIAGNOSIS — M549 Dorsalgia, unspecified: Secondary | ICD-10-CM

## 2012-02-15 MED ORDER — HYDROCODONE-ACETAMINOPHEN 7.5-500 MG PO TABS: 1.0000 | ORAL_TABLET | Freq: Three times a day (TID) | ORAL | Status: DC | PRN

## 2012-02-21 ENCOUNTER — Ambulatory Visit (INDEPENDENT_AMBULATORY_CARE_PROVIDER_SITE_OTHER): Payer: Medicaid Other | Admitting: Family Medicine

## 2012-02-21 ENCOUNTER — Encounter: Payer: Self-pay | Admitting: Family Medicine

## 2012-02-21 VITALS — BP 181/113 | HR 117 | Ht 63.0 in | Wt 380.0 lb

## 2012-02-21 DIAGNOSIS — IMO0002 Reserved for concepts with insufficient information to code with codable children: Secondary | ICD-10-CM

## 2012-02-21 DIAGNOSIS — M179 Osteoarthritis of knee, unspecified: Secondary | ICD-10-CM

## 2012-02-21 DIAGNOSIS — M1710 Unilateral primary osteoarthritis, unspecified knee: Secondary | ICD-10-CM

## 2012-02-21 NOTE — Progress Notes (Signed)
  Subjective:    Patient ID: [name], female    DOB: July 03, 1965, 47 y.o.   MRN: [id number]  HPI  Bilateral knee pain with known severe DJD. Last corticosteroid injection was in August. Continues to get significant as in 75% relief for several months. Now the pain and aching is back. Also having a lot of popping on the left knee.  Review of Systems Denies unusual weight change. Denies redness or warmth of either knee. No fever.    Objective:   Physical Exam Vital signs are reviewed and elevated blood pressure and obesity noted GENERAL: Morbid obesity. Well-developed. No acute distress. KNEES: Crepitus on the left knee. Bilaterally she has full extension and flexion. Medial joint line tenderness bilaterally. No effusion. Calf is soft bilaterally. Neurovascularly intact distally.  INJECTION: Patient was given informed consent, signed copy in the chart. Appropriate time out was taken. Area prepped and draped in usual sterile fashion. One cc of methylprednisolone 40 mg/ml plus  4 cc of 1% lidocaine without epinephrine was injected into the Bilateral knees using a(n) Anterior medial approach. The patient tolerated the procedure well. There were no complications. Post procedure instructions were given.        Assessment & Plan:  Severe DJD bilateral knees. Corticosteroid injections today.

## 2012-03-07 ENCOUNTER — Ambulatory Visit (INDEPENDENT_AMBULATORY_CARE_PROVIDER_SITE_OTHER): Payer: Medicaid Other | Admitting: *Deleted

## 2012-03-07 DIAGNOSIS — Z309 Encounter for contraceptive management, unspecified: Secondary | ICD-10-CM

## 2012-03-08 MED ORDER — MEDROXYPROGESTERONE ACETATE 150 MG/ML IM SUSP
150.0000 mg | Freq: Once | INTRAMUSCULAR | Status: AC
  Administered 2012-03-07: 150 mg via INTRAMUSCULAR

## 2012-03-08 NOTE — Progress Notes (Signed)
Patient has appointment 02/13 for CPE .

## 2012-03-09 ENCOUNTER — Ambulatory Visit: Payer: Medicaid Other | Admitting: Family Medicine

## 2012-03-14 ENCOUNTER — Other Ambulatory Visit: Payer: Self-pay | Admitting: Family Medicine

## 2012-03-14 MED ORDER — BUDESONIDE-FORMOTEROL FUMARATE 160-4.5 MCG/ACT IN AERO: 2.0000 | INHALATION_SPRAY | Freq: Two times a day (BID) | RESPIRATORY_TRACT | Status: DC

## 2012-03-14 MED ORDER — OMEPRAZOLE 40 MG PO CPDR: 40.0000 mg | DELAYED_RELEASE_CAPSULE | Freq: Two times a day (BID) | ORAL | Status: DC

## 2012-03-14 MED ORDER — OXYBUTYNIN CHLORIDE 5 MG PO TABS: 5.0000 mg | ORAL_TABLET | Freq: Two times a day (BID) | ORAL | Status: DC

## 2012-03-14 MED ORDER — MONTELUKAST SODIUM 10 MG PO TABS: 10.0000 mg | ORAL_TABLET | Freq: Every day | ORAL | Status: DC

## 2012-03-14 NOTE — Addendum Note (Signed)
Addended by: Damita Lack on: 03/14/2012 12:34 PM   Modules accepted: Orders

## 2012-04-06 ENCOUNTER — Other Ambulatory Visit: Payer: Self-pay | Admitting: Family Medicine

## 2012-04-06 ENCOUNTER — Other Ambulatory Visit: Payer: Self-pay | Admitting: *Deleted

## 2012-04-06 DIAGNOSIS — M549 Dorsalgia, unspecified: Secondary | ICD-10-CM

## 2012-04-10 ENCOUNTER — Encounter: Payer: Self-pay | Admitting: Family Medicine

## 2012-04-10 ENCOUNTER — Ambulatory Visit (INDEPENDENT_AMBULATORY_CARE_PROVIDER_SITE_OTHER): Payer: Medicaid Other | Admitting: Family Medicine

## 2012-04-10 VITALS — BP 150/100 | HR 121 | Temp 99.0°F | Ht 63.0 in | Wt 397.0 lb

## 2012-04-10 DIAGNOSIS — I10 Essential (primary) hypertension: Secondary | ICD-10-CM

## 2012-04-10 DIAGNOSIS — M1710 Unilateral primary osteoarthritis, unspecified knee: Secondary | ICD-10-CM

## 2012-04-10 DIAGNOSIS — IMO0002 Reserved for concepts with insufficient information to code with codable children: Secondary | ICD-10-CM

## 2012-04-10 DIAGNOSIS — M549 Dorsalgia, unspecified: Secondary | ICD-10-CM

## 2012-04-10 DIAGNOSIS — M179 Osteoarthritis of knee, unspecified: Secondary | ICD-10-CM

## 2012-04-10 MED ORDER — FUROSEMIDE 20 MG PO TABS: 20.0000 mg | ORAL_TABLET | Freq: Every day | ORAL | Status: DC

## 2012-04-10 MED ORDER — HYDROCODONE-ACETAMINOPHEN 10-325 MG PO TABS: 1.0000 | ORAL_TABLET | Freq: Three times a day (TID) | ORAL | Status: DC | PRN

## 2012-04-10 MED ORDER — HYDROCODONE-ACETAMINOPHEN 7.5-500 MG PO TABS: 1.0000 | ORAL_TABLET | Freq: Three times a day (TID) | ORAL | Status: DC | PRN

## 2012-04-10 MED ORDER — CLONIDINE HCL 0.3 MG/24HR TD PTWK: 1.0000 | MEDICATED_PATCH | TRANSDERMAL | Status: DC

## 2012-04-10 NOTE — Patient Instructions (Signed)
It was good to see you today. I am sorry you are not feeling well.  Hopefully the pain medication and getting some fluid off will help your pain.  I will see you in one month for Christian's refills, and you can make an appointment at the same time for your blood work and/or physical exam.  Joice Lofts M. Johnda Billiot, M.D.

## 2012-04-10 NOTE — Assessment & Plan Note (Signed)
Refilled Norco 10-325mg . Continue to exercise and we will monitor weight.

## 2012-04-10 NOTE — Assessment & Plan Note (Addendum)
Clonidine patch increased to 0.3mg /24 hr. Also, she has swelling of her legs with a >10lb weight gain in the last 6 weeks. Will try a trial of Lasix. RTC in 4 weeks and will check [facility] at that time.

## 2012-04-10 NOTE — Progress Notes (Signed)
Patient ID: [name], female   DOB: 12-19-1965, 47 y.o.   MRN: [id number]  [facility] Amber M. Hairford, MD Phone: [phone]   Subjective: HPI: Patient is a 47 y.o. female presenting to clinic today for follow up appointment. Concerns today include pain, hypertension.  1. Hypertension Blood pressure at home: Does not check Blood pressure today: 187/135 Taking Meds: Clonidine patch 0.2/24hr Side effects: None ROS: Denies visual changes, nausea, vomiting, chest pain, abdominal pain or shortness of breath. Does have increased edema of lower extremities for the last month or two. States she thinks it is from her knee pain. No changes in her breathing (baseline asthma and sarcoid)  2. Pain- Pain all over. Takes chronic pain medication that helps some. No new pains. Headaches continue. Toothache x 3-4 days. Hasn't been to dentist in 2 years. Thinks that a filling has fallen out and causing the pain. Needs refill of medication today.    History Reviewed: Non-smoker. Health Maintenance: UTD  ROS: Please see HPI above.  Objective: Office vital signs reviewed. BP 187/135  Pulse 121  Temp(Src) 99 F (37.2 C) (Oral)  Ht 5\' 3"  (1.6 m)  Wt 397 lb (180.078 kg)  BMI 70.34 kg/m2  Physical Examination:  General: Awake, alert. NAD, but appears upset or uncomfortable today HEENT: Atraumatic, normocephalic. Wears glasses. No broken or missing teeth. Neck: No masses palpated. No LAD Pulm: CTAB, no wheezes. Good effort today. No crackles at bases Cardio: RRR, no murmurs appreciated Abdomen: Morbidly obese, +BS, soft, nontender, nondistended Extremities: Large extremities with trace to 1+ edema of lower extremities Neuro: Grossly intact, walks with cane  Assessment: 47 y.o. female follow up appointment  Plan: See Problem List and After Visit Summary

## 2012-05-08 ENCOUNTER — Other Ambulatory Visit: Payer: Self-pay | Admitting: Family Medicine

## 2012-05-17 ENCOUNTER — Ambulatory Visit: Payer: Medicaid Other | Admitting: Family Medicine

## 2012-05-24 ENCOUNTER — Encounter: Payer: Self-pay | Admitting: *Deleted

## 2012-05-26 ENCOUNTER — Encounter: Payer: Self-pay | Admitting: Family Medicine

## 2012-06-20 ENCOUNTER — Other Ambulatory Visit: Payer: Self-pay | Admitting: Family Medicine

## 2012-06-20 NOTE — Telephone Encounter (Signed)
Received [facility]ukast.  Form placed in doctor's box for completion, attached are the preferred drugs. Will have md return form after completion to me. Wyatt Haste, RN-BSN

## 2012-06-21 ENCOUNTER — Encounter: Payer: Self-pay | Admitting: Family Medicine

## 2012-06-22 ENCOUNTER — Telehealth: Payer: Self-pay | Admitting: *Deleted

## 2012-06-22 NOTE — Telephone Encounter (Signed)
[facility]ed [facility] ( WU#[id number] Estella Husk, RN-BSN

## 2012-06-27 ENCOUNTER — Other Ambulatory Visit: Payer: Self-pay | Admitting: Family Medicine

## 2012-06-28 ENCOUNTER — Telehealth: Payer: Self-pay | Admitting: *Deleted

## 2012-06-28 NOTE — Telephone Encounter (Signed)
Received [facility].  Form placed in doctor's box for completion, this is a  preferred drug. Will have md return form after completion to me. Wyatt Haste, RN-BSN

## 2012-06-29 NOTE — Telephone Encounter (Signed)
I am covering Dr. Algis Downs box while she is on vacation.  I have filled out the PA form and placed on [employer]

## 2012-06-30 ENCOUNTER — Encounter: Payer: Self-pay | Admitting: *Deleted

## 2012-06-30 NOTE — Telephone Encounter (Signed)
Entered PAF into [employer] - given confirmation # of [id number] w - will check on Monday if it has been approved. Wyatt Haste, RN-BSN

## 2012-06-30 NOTE — Telephone Encounter (Signed)
This encounter was created in error - please disregard.

## 2012-07-03 NOTE — Telephone Encounter (Signed)
Still pending prior authorization. Wyatt Haste, RN-BSN

## 2012-07-04 NOTE — Telephone Encounter (Signed)
Prior authorization approved from 06/19/12 for Montelukast - PA# [id number] w - [facility] faxed approval form. Wyatt Haste, RN-BSN

## 2012-07-12 ENCOUNTER — Ambulatory Visit (INDEPENDENT_AMBULATORY_CARE_PROVIDER_SITE_OTHER): Payer: Medicaid Other | Admitting: Family Medicine

## 2012-07-12 ENCOUNTER — Encounter: Payer: Self-pay | Admitting: Family Medicine

## 2012-07-12 VITALS — BP 140/91 | HR 116 | Ht 63.0 in | Wt 365.0 lb

## 2012-07-12 DIAGNOSIS — M25561 Pain in right knee: Secondary | ICD-10-CM

## 2012-07-12 DIAGNOSIS — M25562 Pain in left knee: Secondary | ICD-10-CM

## 2012-07-12 DIAGNOSIS — M25569 Pain in unspecified knee: Secondary | ICD-10-CM

## 2012-07-12 NOTE — Assessment & Plan Note (Signed)
2/2 severe DJD.  Ultrasound used to identify medial joint lines prior to injection.  Both knees injected intraarticularly.  F/u prn.  After informed written consent, patient was seated on exam table. Right knee was prepped with alcohol swab and utilizing anteromedial approach, patient's right knee was injected intraarticularly with 3:1 marcaine: depomedrol. Patient tolerated the procedure well without immediate complications.  After informed written consent, patient was seated on exam table. Left knee was prepped with alcohol swab and utilizing anteromedial approach, patient's left knee was injected intraarticularly with 3:1 marcaine: depomedrol. Patient tolerated the procedure well without immediate complications.

## 2012-07-12 NOTE — Progress Notes (Signed)
Patient ID: [name], female   DOB: 18-Feb-1965, 47 y.o.   MRN: [id number]  PCP: Rodman Pickle, MD  Subjective:   HPI: Patient is a 47 y.o. female here for bilateral knee pain.  Patient has known severe arthritis of both knees. Radiographs confirmed this from 01/08/11. Last cortisone injections given January of this year in [place] office. Swelling of both knees at times. Wanted to come in sooner but car broke down. Using cane to help with ambulation  Past Medical History  Diagnosis Date  . Myasthenia gravis 1994    Positive Acetylcholine receptor Ab and single fiber EMG Dr. Clarisa [facility] 1996- Dr. Noreene Filbert 1998, Dr Sharene Skeans 2008 [facility]- Failed prednisone due to weight gain, stopped imuran/mestinon due to finances- Now with quiescent disease per Dr Sharene Skeans and no flares in many years  . 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 Coralyn Helling- 10/30/2007 ANA positive, ANA titer  negative, RF less than 20  . ALLERGIC RHINITIS     takes Zyrtec daily  . OSA (obstructive sleep apnea)     RDI 10 on PSG 10/09  . Hypoventilation associated with obesity syndrome   . Pelvic inflammatory disease (PID)   . Viral meningitis     history of viral meningitis  . Irritable bladder   . Hypertension     takes Prinizide daily and Clonidine on Mondays  . Asthma     Albuterol prn;Symbicort daily and Singulair daily  . Shortness of breath     can be sitting as well as exertion  . Pneumonia     hx of;last time about 1-46yrs ago  . Bronchitis     hx of  . Headache(784.0)     couple of times a week  . Dizziness     pt states she gets off balance occasionally  . Arthritis   . Joint pain   . Joint swelling   . Myasthenia gravis   . Chronic back pain   . Osteoarthritis   . Rheumatoid arthritis(714.0)   . Carpal tunnel syndrome     right  . Eczema   . Trigger finger   . GERD (gastroesophageal reflux disease)     takes Omeprazole  daily  . Peptic ulcer   . Constipation     Miralax prn  . Hemorrhoids   . Urinary frequency     takes Ditropan daily  . Urinary urgency   . Nocturia   . Anxiety   . Depression     takes Cymbalta daily  . Insomnia   . Lung disease     scleraderma  . Skin irritation     skin itchy    Current Outpatient Prescriptions on File Prior to Visit  Medication Sig Dispense Refill  . albuterol (PROVENTIL HFA) 108 (90 BASE) MCG/ACT inhaler Inhale 2 puffs into the lungs every 4 (four) hours as needed for wheezing.  1 Inhaler  5  . budesonide-formoterol (SYMBICORT) 160-4.5 MCG/ACT inhaler Inhale 2 puffs into the lungs 2 (two) times daily.  10.2 g  3  . cetirizine (ZYRTEC) 10 MG tablet Take 1 tablet (10 mg total) by mouth daily.  30 tablet  3  . cloNIDine (CATAPRES - DOSED IN MG/24 HR) 0.3 mg/24hr Place 1 patch (0.3 mg total) onto the skin once a week.  4 patch  12  . DULoxetine (CYMBALTA) 60 MG capsule Take 1 capsule (60 mg total) by mouth daily.  30 capsule  3  . furosemide (LASIX) 20 MG tablet Take 1 tablet (20 mg total) by mouth daily.  30 tablet  1  . HYDROcodone-acetaminophen (NORCO) 10-325 MG per tablet Take 1 tablet by mouth every 8 (eight) hours as needed for pain.  60 tablet  3  . lisinopril-hydrochlorothiazide (PRINZIDE,ZESTORETIC) 20-12.5 MG per tablet TAKE 2 TABLETS BY MOUTH DAILY.  30 tablet  3  . medroxyPROGESTERone (DEPO-PROVERA) 150 MG/ML injection Inject 150 mg into the muscle every 3 (three) months.       . montelukast (SINGULAIR) 10 MG tablet Take 1 tablet (10 mg total) by mouth at bedtime.  30 tablet  2  . montelukast (SINGULAIR) 10 MG tablet TAKE 1 TABLET BY MOUTH ONCE A DAY  30 tablet  2  . omeprazole (PRILOSEC) 40 MG capsule TAKE ONE CAPSULE BY MOUTH TWICE A DAY  60 capsule  1  . oxybutynin (DITROPAN) 5 MG tablet TAKE 1 TABLET BY MOUTH TWICE A DAY  60 tablet  2  . POLYETHYLENE GLYCOL 3350 PO Take by mouth daily. Patient takes 17g daily       . predniSONE (DELTASONE) 10 MG  tablet TAKE 1 TABLET (10 MG TOTAL) BY MOUTH DAILY.  90 tablet  1  . PROAIR HFA 108 (90 BASE) MCG/ACT inhaler USE 2 PUFFS EVERY 4 HOURS AS NEEDED  8.5 each  3  . promethazine (PHENERGAN) 25 MG tablet Take 1 tablet (25 mg total) by mouth every 8 (eight) hours as needed for nausea.  20 tablet  0  . [DISCONTINUED] oxybutynin (DITROPAN) 5 MG tablet TAKE 1 TABLET BY MOUTH 2 TIMES A DAY  60 tablet  2   No current facility-administered medications on file prior to visit.    Past Surgical History  Procedure Laterality Date  . Cesarean section  09/15/2004  . Bronchoscopy  08/2007  . Thymus resection  08/25/1993  . Laparoscopic cholecystectomy  07/2008    by Dr. Cyndia Bent  . Cortisone injection      receives an injection every 3months  . Esophagogastroduodenoscopy    . Trigger finger release  05/20/2011    Procedure: RELEASE TRIGGER FINGER/A-1 PULLEY;  Surgeon: [name], MD;  Location: [facility] OR;  Service: Orthopedics;  Laterality: Right;  RIGHT TRIGGER THUMB RELEASE AND RIGHT CARPAL TUNNEL RELEASE  . Carpal tunnel release  05/20/2011    Procedure: CARPAL TUNNEL RELEASE;  Surgeon: [name], MD;  Location: [facility] OR;  Service: Orthopedics;  Laterality: Right;    Allergies  Allergen Reactions  . Apple     "mouth itch"  . Banana     "whelps on tongue"  . Shrimp (Shellfish Allergy) Nausea And Vomiting    History   Social History  . Marital Status: Married    Spouse Name: N/A    Number of Children: N/A  . Years of Education: N/A   Occupational History  . Not on file.   Social History Main Topics  . Smoking status: Never Smoker   . Smokeless tobacco: Not on file  . Alcohol Use: No  . Drug Use: No  . Sexually Active: Yes    Birth Control/ Protection: Injection   Other Topics Concern  . Not on file   Social History Narrative   Lives in a house with husband (s/p incarceration), daughter, 2 sons, and grandson, unemployed, unemployed, no pets, no tob, no  etoh, no exercise, no healthy diet, more than or equal to 12 yrs education, christian, has motorized scooter that she  uses periodically, husband smokes in the house    Family History  Problem Relation Age of Onset  . Hypertension Father   . Sickle cell trait Father   . Stroke Paternal Grandmother   . Sickle cell trait Paternal Grandfather   . Lupus    . Anesthesia problems Neg Hx   . Hypotension Neg Hx   . Malignant hyperthermia Neg Hx   . Pseudochol deficiency Neg Hx     BP 140/91  Pulse 116  Ht 5\' 3"  (1.6 m)  Wt 365 lb (165.563 kg)  BMI 64.67 kg/m2  Review of Systems: See HPI above.    Objective:  Physical Exam:  Gen: NAD, obese  Bilateral knees: Soft tissue swelling.  Effusion difficult to discern.  No bruising, other deformity. TTP medial > lateral joint lines.  Post patellar facet tenderness only right knee. ROM 0 - 100 degrees bilaterally. Negative ant/post drawers. Negative valgus/varus testing. Negative lachmanns. Negative apleys, patellar apprehension. NV intact distally.    Assessment & Plan:  1. Bilateral knee pain - 2/2 severe DJD.  Ultrasound used to identify medial joint lines prior to injection.  Both knees injected intraarticularly.  F/u prn.  After informed written consent, patient was seated on exam table. Right knee was prepped with alcohol swab and utilizing anteromedial approach, patient's right knee was injected intraarticularly with 3:1 marcaine: depomedrol. Patient tolerated the procedure well without immediate complications.  After informed written consent, patient was seated on exam table. Left knee was prepped with alcohol swab and utilizing anteromedial approach, patient's left knee was injected intraarticularly with 3:1 marcaine: depomedrol. Patient tolerated the procedure well without immediate complications.

## 2012-07-29 ENCOUNTER — Other Ambulatory Visit: Payer: Self-pay | Admitting: Family Medicine

## 2012-07-31 ENCOUNTER — Telehealth: Payer: Self-pay | Admitting: *Deleted

## 2012-07-31 NOTE — Telephone Encounter (Signed)
[facility] patch  Form placed in doctor's box for completion, attached are the preferred drugs. Will have md return form after completion to me. Wyatt Haste, RN-BSN

## 2012-08-01 ENCOUNTER — Ambulatory Visit (INDEPENDENT_AMBULATORY_CARE_PROVIDER_SITE_OTHER): Payer: Medicaid Other | Admitting: Pharmacist

## 2012-08-01 ENCOUNTER — Encounter: Payer: Self-pay | Admitting: Pharmacist

## 2012-08-01 VITALS — BP 115/79 | HR 125 | Ht 63.0 in | Wt >= 6400 oz

## 2012-08-01 DIAGNOSIS — I10 Essential (primary) hypertension: Secondary | ICD-10-CM

## 2012-08-01 DIAGNOSIS — Z309 Encounter for contraceptive management, unspecified: Secondary | ICD-10-CM

## 2012-08-01 MED ORDER — CLONIDINE HCL 0.3 MG/24HR TD PTWK: 1.0000 | MEDICATED_PATCH | TRANSDERMAL | Status: DC

## 2012-08-01 NOTE — Telephone Encounter (Signed)
Pt called and explained that patch would no longer be approved and an appointment would have to be made with pharmacy clinic for alternatives. Pt will come in at 2:15 this afternoon with Dr. Raymondo Band. Wyatt Haste, RN-BSN

## 2012-08-01 NOTE — Progress Notes (Signed)
S:   Patient arrives walking slowly with assistance of a cane.   She is unhappy about her weight.   States "I have to get off the prednisone".   Patient is happy to continue taking clonidine.     Patient requests Depo-Provera Shot - unsure of last administration date.     A/P BP at goal on three drug regimen.  Appears to be required a prior auth per Dr. Mikel Cella.   After calling Pharmacy it was determined that she just needs switch to branded clonidine Catapress- TTS.   Provided new Rx for TTS-3 with additional refills for 1 year.  Reinforced need for adherence with medications.   Depo - Provera - deferred to nursing staff.

## 2012-08-01 NOTE — Patient Instructions (Addendum)
Please take your medicine more consistently.   Please try to avoid being late on medications.

## 2012-08-01 NOTE — Telephone Encounter (Signed)
Patient has not failed medications in that class, but she has been on other hypertension agents. Discussed with Dr. Raymondo Band. Please have patient make an appointment in pharmacy clinic soon for her difficult to control HTN to discuss options and/or have the patch approved. Preferably, this appointment would be before she runs out of the patch.  Amber M. Hairford, M.D. 08/01/2012 7:33 AM

## 2012-08-01 NOTE — Assessment & Plan Note (Signed)
BP at goal on three drug regimen.  Appears to be required a prior auth per Dr. Mikel Cella.   After calling Pharmacy it was determined that she just needs switch to branded clonidine Catapress- TTS.   Provided new Rx for TTS-3 with additional refills for 1 year.

## 2012-08-02 NOTE — Progress Notes (Signed)
Patient ID: [name], female   DOB: 03-18-1965, 47 y.o.   MRN: [id number] Reviewed: Agree with Dr. Macky Lower documentation and management.

## 2012-08-14 ENCOUNTER — Encounter: Payer: Self-pay | Admitting: Family Medicine

## 2012-08-14 ENCOUNTER — Ambulatory Visit (INDEPENDENT_AMBULATORY_CARE_PROVIDER_SITE_OTHER): Payer: Medicaid Other | Admitting: Family Medicine

## 2012-08-14 VITALS — BP 148/86 | HR 112 | Temp 98.4°F | Wt 399.0 lb

## 2012-08-14 DIAGNOSIS — Z309 Encounter for contraceptive management, unspecified: Secondary | ICD-10-CM

## 2012-08-14 DIAGNOSIS — I10 Essential (primary) hypertension: Secondary | ICD-10-CM

## 2012-08-14 DIAGNOSIS — F3289 Other specified depressive episodes: Secondary | ICD-10-CM

## 2012-08-14 DIAGNOSIS — F329 Major depressive disorder, single episode, unspecified: Secondary | ICD-10-CM

## 2012-08-14 MED ORDER — MEDROXYPROGESTERONE ACETATE 150 MG/ML IM SUSP
150.0000 mg | Freq: Once | INTRAMUSCULAR | Status: AC
  Administered 2012-08-14: 150 mg via INTRAMUSCULAR

## 2012-08-14 MED ORDER — HYDROCODONE-ACETAMINOPHEN 10-325 MG PO TABS: 1.0000 | ORAL_TABLET | Freq: Three times a day (TID) | ORAL | Status: DC | PRN

## 2012-08-14 MED ORDER — FLUOXETINE HCL 20 MG PO TABS: 20.0000 mg | ORAL_TABLET | Freq: Every day | ORAL | Status: DC

## 2012-08-14 NOTE — Patient Instructions (Addendum)
It was good to see you today, I am glad things are going well.  I have sent in a new prescription for Prozac 20mg  to start taking for depression. Try this for a month and then come back to see me and lets see how you do. This is NOT a medicine that you can stop suddenly. If in one month things have not improved, I think it would be good for you to see a mental health doctor.   I have refilled your pain medication, but let the pharmacy know if you need any other refills. For your next appointment, have the scheduler known that you need a physical appointment.  Tarisa Paola M. Raquan Iannone, M.D.

## 2012-08-14 NOTE — Progress Notes (Signed)
Patient ID: [name], female   DOB: Oct 01, 1965, 47 y.o.   MRN: [id number]  [facility] Angelamarie Avakian M. Barrington Worley, MD Phone: [phone]   Subjective: HPI: Patient is a 47 y.o. female presenting to clinic today for follow up appointment. Concerns today include depo  1. Hypertension - Had previous problem with medicaid not covering patches, but this was resolved in a visit with Dr. Raymondo Band and she has now restarted this medication. Blood pressure at home: Does not check Blood pressure today:  Taking Meds: Yes, on clonidine patch. Take Lisinopril daily (when she remembers)  Side effects: None. ROS: Denies headache, visual changes, nausea, vomiting, chest pain, abdominal pain or shortness of breath.  2. Contraception - Needs depo today. Was due in May. Not currently sexually active. Urine preg pending. No problems while on depo previously.   3. Depression - Not taking Cymbalta, does not want to go anywhere. Only leaves to go to the grocery store and doctors office. Stays in her bedroom when she is at home. Still stays moody and short with her kids. Noise is more irritating than usually. Patient would like to restart something. Has not seen mental health but this has been a long standing problem.   History Reviewed: Non smoker. Health Maintenance: Needs pap, but unable to perform today.  ROS: Please see HPI above.  Objective: Office vital signs reviewed. BP 148/86  Pulse 112  Temp(Src) 98.4 F (36.9 C) (Oral)  Wt 399 lb (180.985 kg)  BMI 70.7 kg/m2  Physical Examination:  General: Awake, alert. NAD. Sweating excessively HEENT: Atraumatic, normocephalic. MMM. Pulm: CTAB, no wheezes Cardio: RRR, no murmurs appreciated Abdomen: Obese, soft, nontender, nondistended Extremities: No edema Neuro: Grossly intact  Assessment: 47 y.o. female follow up appointment  Plan: See Problem List and After Visit Summary

## 2012-08-15 DIAGNOSIS — Z309 Encounter for contraceptive management, unspecified: Secondary | ICD-10-CM | POA: Insufficient documentation

## 2012-08-15 NOTE — Assessment & Plan Note (Signed)
Given depo today and will return for her q3 month shot.

## 2012-08-15 NOTE — Assessment & Plan Note (Signed)
BP elevated with ambulation but recheck at goal. Continue patch and Lisinopril.

## 2012-08-15 NOTE — Assessment & Plan Note (Signed)
Will start Prozac 20mg . Patient advised not to stop suddenly and it is important to take it daily, although she has problems with po medication compliance. RTC in 2-4 weeks for recheck. Will possibly need mental health provider given her amount of stress, as well as comorbid conditions although I am not sure how she will manage that with 4 children. I am hopeful the SSRI will help if she can take it.

## 2012-08-19 ENCOUNTER — Other Ambulatory Visit: Payer: Self-pay | Admitting: Family Medicine

## 2012-08-24 ENCOUNTER — Encounter: Payer: Medicaid Other | Admitting: Family Medicine

## 2012-09-03 ENCOUNTER — Other Ambulatory Visit: Payer: Self-pay | Admitting: Family Medicine

## 2012-09-14 ENCOUNTER — Other Ambulatory Visit (HOSPITAL_COMMUNITY)
Admission: RE | Admit: 2012-09-14 | Discharge: 2012-09-14 | Disposition: A | Discharge status: Home / Self Care | Payer: Medicaid Other | Source: Ambulatory Visit | Attending: Family Medicine | Admitting: Family Medicine

## 2012-09-14 ENCOUNTER — Encounter: Payer: Self-pay | Admitting: Family Medicine

## 2012-09-14 ENCOUNTER — Ambulatory Visit (INDEPENDENT_AMBULATORY_CARE_PROVIDER_SITE_OTHER): Payer: Medicaid Other | Admitting: Family Medicine

## 2012-09-14 VITALS — BP 131/84 | HR 121 | Temp 98.6°F | Wt 381.0 lb

## 2012-09-14 DIAGNOSIS — F329 Major depressive disorder, single episode, unspecified: Secondary | ICD-10-CM

## 2012-09-14 DIAGNOSIS — Z01419 Encounter for gynecological examination (general) (routine) without abnormal findings: Secondary | ICD-10-CM

## 2012-09-14 DIAGNOSIS — Z1151 Encounter for screening for human papillomavirus (HPV): Secondary | ICD-10-CM | POA: Insufficient documentation

## 2012-09-14 DIAGNOSIS — F3289 Other specified depressive episodes: Secondary | ICD-10-CM

## 2012-09-14 DIAGNOSIS — Z0000 Encounter for general adult medical examination without abnormal findings: Secondary | ICD-10-CM

## 2012-09-14 DIAGNOSIS — Z124 Encounter for screening for malignant neoplasm of cervix: Secondary | ICD-10-CM

## 2012-09-14 DIAGNOSIS — IMO0002 Reserved for concepts with insufficient information to code with codable children: Secondary | ICD-10-CM | POA: Insufficient documentation

## 2012-09-14 DIAGNOSIS — N8111 Cystocele, midline: Secondary | ICD-10-CM

## 2012-09-14 MED ORDER — FLUOXETINE HCL 40 MG PO CAPS: 40.0000 mg | ORAL_CAPSULE | Freq: Every day | ORAL | Status: DC

## 2012-09-14 NOTE — Assessment & Plan Note (Signed)
Increased Prozac to 40mg  daily. Will reassess in one month.

## 2012-09-14 NOTE — Patient Instructions (Addendum)
Cystocele Repair A cystocele is a bulging, drooping hernia or break (rupture) of bladder tissue into the birth canal (vagina). This bulging or rupture occurs on the top front wall of the vagina. CAUSES  Cystocele is associated with weakness of the top front wall of the vagina due to stretching and tearing of the ligaments and muscles in the area. This is often the result of:  Multiple childbirths.  Continuous heavy lifting.  Chronic cough from asthma, emphysema, or smoking.  Being overweight.  Changes from aging.  Previous surgery in the vaginal area.  Menopause with loss of estrogen hormone and weakening of the ligaments and muscles around the bladder. SYMPTOMS   Uncontrolled loss of urine (incontinence) with cough, sneeze, or exercise.  Pelvic pressure.  Frequency or urgency to urinate because of inability to completely empty the bladder.  Bladder infections.  Needing to push on the upper vagina to help yourself pass urine. DIAGNOSIS  A cystocele can be diagnosed by doing a pelvic exam and observing the top of the vagina drooping or bulging into or out of the vagina. TREATMENT  Surgical options:  Cystocele repair is surgery that removes the hernia.  There are also different "sling" operations that may be used. Discuss the different types of surgeries to repair a cystocele with your caregiver. Your caregiver will decide what type of surgery will be best in your case. Nonsurgical options:  Kegel exercises. This helps strengthen and tighten the muscles and tissue in and around the bladder and vagina. This may help with mild cases of cystocele.  A pessary may help the cystocele. A pessary is a plastic or rubber device that lifts the bladder into place. A pessary must be fitted by a doctor.  Tampons or diaphragms that lift the bladder into place are sometimes helpful with a minor or small cystocele.  Estrogen may help with mild cases in menopausal and aging women. LET YOUR  CAREGIVER KNOW ABOUT:   Allergies to food or medicine.  Medicines taken, including vitamins, herbs, eyedrops, over-the-counter medicines, and creams.  Use of steroids (by mouth or creams).  Previous problems with anesthetics or numbing medicines.  History of bleeding problems or blood clots.  Previous surgery.  Other health problems, including diabetes and kidney problems.  Possibility of pregnancy, if this applies. RISKS AND COMPLICATIONS  All surgery is associated with risks.  There are risks with a general anesthesia. You should discuss this with your caregiver.  With spinal or epidural anesthesia, there may be an area that is not numbed, and you could feel pain.  Headache could occur with a spinal or epidural anesthetic.  The catheter you will have after surgery may not work properly or may get blocked and need to be replaced.  Excessive bleeding.  Infection.  Injury to surrounding structures.  Recurrence of the cystocele.  Surgery may not get rid of your symptoms. BEFORE THE PROCEDURE   Do not take aspirin or blood thinners for 1 week prior to surgery, unless instructed otherwise.  Do not eat or drink anything after midnight the night before surgery.  Let your caregiver know if you develop a cold or other infectious problems prior to surgery.  If being admitted the day of surgery, you should be present 1 hour prior to your procedure or as directed by your caregiver.  Plan and arrange for help when you go home from the hospital.  If you smoke, do not smoke for at least 2 weeks before the surgery.  Do not   drink any alcohol for 3 days before the surgery. PROCEDURE  You will be given an anesthetic to prevent you from feeling pain during surgery. This may be a general anesthetic that puts you to sleep, or a spinal or epidural anesthetic. You will be asleep or be numbed through the entire procedure. During cystocele repair, tissue is pulled from the sides and  around the top of the vagina to lift up the hernia. This removes the hernia so that the top of the vagina does not fall into the opening of the vagina. AFTER THE PROCEDURE  After surgery, you will be taken to the recovery room where a nurse will take care of you, checking your breathing, blood pressure, pulse, and your progress. When your caregiver feels you are stable, you will be taken to your room. You will have a drainage tube (Foley catheter) that will drain your bladder for 2 to 7 days or longer, until your bladder is working properly. This catheter is placed prior to surgery to help keep your bladder empty and out of the way during the procedure. After surgery, this will make passing your urine easier. The catheter will be removed when you can easily pass urine without this assistance. You may have gauze packing in the vagina that will be removed 1 to 2 days after the surgery. Usually, you will be given a medicine (antibiotic) that kills germs. You will be given pain medicine as needed. You can usually go home in 3 to 5 days. HOME CARE INSTRUCTIONS   Do not take baths. Take showers until your caregiver informs you otherwise.  Take antibiotics as directed by your caregiver.  Exercise as instructed. Do not perform exercises which increase the pressure inside your belly (abdomen), such as sit-ups or lifting weights, until your caregiver has given permission. Walking exercise is preferred.  Only take over-the-counter or prescription medicines for pain and discomfort as directed by your caregiver.  Do not drink alcohol while taking pain medicine.  Do not lift anything over 5 pounds.  Do not drive until your caregiver gives you permission.  Get plenty of rest and sleep.  Have someone help with your household chores for 1 to 2 weeks.  If you develop constipation, you may take a mild laxative with your caregiver's permission. Eating bran foods and drinking enough water and fluids to keep your  urine clear or pale yellow helps with constipation.  Do not take aspirin. It may cause bleeding.  You may resume normal diet and unstrenuous activities as directed.  Do not douche, use tampons, or engage in intercourse until your surgeon has given permission.  Change bandages (dressings) as directed.  Make and keep all your postoperative appointments. SEEK MEDICAL CARE IF:   You have abnormal vaginal discharge.  You develop a rash.  You are having a reaction to your medicine.  You develop nausea or vomiting. SEEK IMMEDIATE MEDICAL CARE IF:   You have redness, swelling, or increasing pain in the vaginal area.  You notice pus coming from the vagina.  You have a fever.  You notice a bad smell coming from the vagina.  You have increasing abdominal pain.  You have frequent urination or you notice burning during urination.  You notice blood in your urine.  You have excessive vaginal bleeding.  You cannot urinate. MAKE SURE YOU:   Understand these instructions.  Will watch your condition.  Will get help right away if you are not doing well or get worse. Document Released:   01/09/2000 Document Revised: 04/05/2011 Document Reviewed: 04/10/2009 Exit[employer] Patient Information 2014 [employer].  

## 2012-09-14 NOTE — Progress Notes (Signed)
Patient ID: [name], female   DOB: [birth year], 47 y.o.   MRN: [id number]  [facility] Brysten Reister M. Zoriana Oats, MD Phone: [phone]   Subjective: HPI: Patient is a 47 y.o. female presenting to clinic today for CPE.  Routine Gyn Exam Patient here for routine exam. Current complaints: feels a hard knot in vagina at times. No dysuria, discharge or irritation.  Personal health questionnaire reviewed: yes   Gynecologic History No LMP recorded. Patient has had an injection. Contraception: Depo-Provera injections Last Pap: 2009. Results were: normal Last mammogram: Mammo. Results were: normal  Obstetric History OB History  No data available   Depression Started on Prozac 20mg  one month ago. States she has not seen a difference yet. No SI/HI, but still prefers to be alone. Wants to increase her dose.  History Reviewed: Non- smoker. Health Maintenance: UTD  ROS: Please see HPI above.  Objective: Office vital signs reviewed. BP 131/84  Pulse 121  Temp(Src) 98.6 F (37 C) (Oral)  Wt 381 lb (172.82 kg)  BMI 67.51 kg/m2  Physical Examination:  General: Awake, alert. NAD. Walks with cane HEENT: Atraumatic, normocephalic. MMM Neck: No masses palpated. No LAD Pulm: CTAB, no wheezes Cardio: RRR, no murmurs appreciated Abdomen: Obese, nontender GU: Cystocele seen on speculum exam. No external irritation. Scant white discharge in vagina. Cervix visualized and pap obtained. Extremities: No edema Neuro: Grossly intact  Assessment: 47 y.o. female CPE  Plan: See Problem List and After Visit Summary

## 2012-09-14 NOTE — Assessment & Plan Note (Signed)
Seen on exam. Difficult to fully assess with speculum we have available due to her body habitus. Will refer to GYN for further exam and recommendations. Given handout with information.

## 2012-09-18 ENCOUNTER — Encounter: Payer: Self-pay | Admitting: Family Medicine

## 2012-10-10 ENCOUNTER — Other Ambulatory Visit: Payer: Self-pay | Admitting: Family Medicine

## 2012-11-03 ENCOUNTER — Ambulatory Visit (INDEPENDENT_AMBULATORY_CARE_PROVIDER_SITE_OTHER): Payer: Medicaid Other | Admitting: Family Medicine

## 2012-11-03 ENCOUNTER — Ambulatory Visit (INDEPENDENT_AMBULATORY_CARE_PROVIDER_SITE_OTHER): Payer: Medicaid Other | Admitting: *Deleted

## 2012-11-03 DIAGNOSIS — Z309 Encounter for contraceptive management, unspecified: Secondary | ICD-10-CM

## 2012-11-03 DIAGNOSIS — Z23 Encounter for immunization: Secondary | ICD-10-CM

## 2012-11-03 DIAGNOSIS — IMO0002 Reserved for concepts with insufficient information to code with codable children: Secondary | ICD-10-CM

## 2012-11-03 DIAGNOSIS — M1710 Unilateral primary osteoarthritis, unspecified knee: Secondary | ICD-10-CM

## 2012-11-03 DIAGNOSIS — M179 Osteoarthritis of knee, unspecified: Secondary | ICD-10-CM

## 2012-11-03 MED ORDER — METHYLPREDNISOLONE ACETATE 40 MG/ML IJ SUSP
40.0000 mg | Freq: Once | INTRAMUSCULAR | Status: AC
  Administered 2012-11-03: 40 mg via INTRA_ARTICULAR

## 2012-11-03 MED ORDER — MEDROXYPROGESTERONE ACETATE 150 MG/ML IM SUSP
150.0000 mg | Freq: Once | INTRAMUSCULAR | Status: AC
  Administered 2012-11-03: 150 mg via INTRAMUSCULAR

## 2012-11-03 NOTE — Progress Notes (Signed)
  Subjective:    Patient ID: [name], female    DOB: 12/11/1965, 47 y.o.   MRN: [id number]  HPI  Followup bilateral knee arthritis. She had corticosteroid injections last in June. She had pretty significant improvement, at least 50% decrease pain until about a month ago. She would like to get 2 more shots today.  Review of Systems Has noted no erythema or warmth of her knees. Denies fever, sweats, chills.    Objective:   Physical Exam Vital signs are reviewed GENERAL: Well-developed obese female no acute distress KNEES: Bilaterally she is tender to palpation at the medial joint line. Calf is soft bilaterally. Distally she is intact sensation to soft touch.  INJECTION: Patient was given informed consent, signed copy in the chart. Appropriate time out was taken. Area prepped and draped in usual sterile fashion. One cc of methylprednisolone 40 mg/ml plus  4 cc of 1% lidocaine without epinephrine was injected into the bilateral knees using a(n) anterior medial approach. The patient tolerated the procedure well. There were no complications. Post procedure instructions were given.        Assessment & Plan:  Bilateral severe knee OA. Corticosteroid injections today. She's aware that weight loss would significantly improve her course.

## 2012-11-03 NOTE — Progress Notes (Signed)
Patient in today for depo shot. Injection given in right deltoid, no complaints noted. Next depo due December 26 - January 9, patient aware.

## 2012-11-24 ENCOUNTER — Other Ambulatory Visit: Payer: Self-pay | Admitting: Family Medicine

## 2012-12-06 ENCOUNTER — Encounter: Payer: Medicaid Other | Admitting: Obstetrics & Gynecology

## 2012-12-14 ENCOUNTER — Telehealth: Payer: Self-pay | Admitting: Family Medicine

## 2012-12-14 NOTE — Telephone Encounter (Signed)
Appt made for 12/26/12 with you and would like to know if you will write enough to last her til then.  States that she only has 4 left. Please advise. Jazmin Hartsell,CMA

## 2012-12-14 NOTE — Telephone Encounter (Signed)
Needs prescription refill on pain medicine-hydro-codone Please notify when ready for pickup

## 2012-12-15 ENCOUNTER — Other Ambulatory Visit: Payer: Self-pay | Admitting: Family Medicine

## 2012-12-15 MED ORDER — HYDROCODONE-ACETAMINOPHEN 10-325 MG PO TABS: 1.0000 | ORAL_TABLET | Freq: Three times a day (TID) | ORAL | Status: DC | PRN

## 2012-12-15 NOTE — Telephone Encounter (Signed)
LMOVM "the rx you requested is ready for you to pick up". [name], Maryjo Rochester

## 2012-12-15 NOTE — Telephone Encounter (Signed)
Rx refilled for one month and will be available at the front desk.  Thanks! Amber M. Hairford, M.D.

## 2012-12-25 ENCOUNTER — Ambulatory Visit (INDEPENDENT_AMBULATORY_CARE_PROVIDER_SITE_OTHER): Payer: Medicaid Other | Admitting: Family Medicine

## 2012-12-25 ENCOUNTER — Encounter: Payer: Self-pay | Admitting: Family Medicine

## 2012-12-25 VITALS — BP 137/83 | HR 85 | Temp 98.3°F | Ht 63.0 in | Wt 359.0 lb

## 2012-12-25 DIAGNOSIS — R21 Rash and other nonspecific skin eruption: Secondary | ICD-10-CM

## 2012-12-25 MED ORDER — PERMETHRIN 5 % EX CREA: 1.0000 "application " | TOPICAL_CREAM | Freq: Once | CUTANEOUS | Status: DC

## 2012-12-25 NOTE — Patient Instructions (Signed)
This is either bed bugs, flees or scabies. We are going to treat it with a cream to put from head to toe and leave on for 8 hrs.

## 2012-12-26 ENCOUNTER — Encounter: Payer: Self-pay | Admitting: Family Medicine

## 2012-12-26 ENCOUNTER — Ambulatory Visit (INDEPENDENT_AMBULATORY_CARE_PROVIDER_SITE_OTHER): Payer: Medicaid Other | Admitting: Family Medicine

## 2012-12-26 VITALS — BP 173/109 | HR 93 | Temp 98.1°F | Ht 63.0 in | Wt 358.0 lb

## 2012-12-26 DIAGNOSIS — Z23 Encounter for immunization: Secondary | ICD-10-CM

## 2012-12-26 DIAGNOSIS — R21 Rash and other nonspecific skin eruption: Secondary | ICD-10-CM

## 2012-12-26 DIAGNOSIS — I10 Essential (primary) hypertension: Secondary | ICD-10-CM

## 2012-12-26 DIAGNOSIS — J45909 Unspecified asthma, uncomplicated: Secondary | ICD-10-CM

## 2012-12-26 MED ORDER — PREDNISONE 10 MG PO TABS: ORAL_TABLET | ORAL | Status: DC

## 2012-12-26 MED ORDER — OXYBUTYNIN CHLORIDE 5 MG PO TABS: ORAL_TABLET | ORAL | Status: DC

## 2012-12-26 MED ORDER — BUDESONIDE-FORMOTEROL FUMARATE 160-4.5 MCG/ACT IN AERO: 2.0000 | INHALATION_SPRAY | Freq: Two times a day (BID) | RESPIRATORY_TRACT | Status: DC

## 2012-12-26 MED ORDER — CLONIDINE HCL 0.3 MG/24HR TD PTWK: 0.3000 mg | MEDICATED_PATCH | TRANSDERMAL | Status: DC

## 2012-12-26 MED ORDER — OMEPRAZOLE 40 MG PO CPDR: DELAYED_RELEASE_CAPSULE | ORAL | Status: DC

## 2012-12-26 MED ORDER — MONTELUKAST SODIUM 10 MG PO TABS: ORAL_TABLET | ORAL | Status: DC

## 2012-12-26 MED ORDER — CETIRIZINE HCL 10 MG PO TABS: 10.0000 mg | ORAL_TABLET | Freq: Every day | ORAL | Status: DC

## 2012-12-26 MED ORDER — GUAIFENESIN-CODEINE 100-10 MG/5ML PO SYRP: 5.0000 mL | ORAL_SOLUTION | Freq: Three times a day (TID) | ORAL | Status: DC | PRN

## 2012-12-26 MED ORDER — LISINOPRIL-HYDROCHLOROTHIAZIDE 20-12.5 MG PO TABS: 1.0000 | ORAL_TABLET | Freq: Every day | ORAL | Status: DC

## 2012-12-26 MED ORDER — FLUOXETINE HCL 40 MG PO CAPS: 40.0000 mg | ORAL_CAPSULE | Freq: Every day | ORAL | Status: DC

## 2012-12-26 NOTE — Patient Instructions (Signed)
It was good to see you today!  I have refilled your medicines for you, except for the cough medicine I had to print off.  Make sure you wash your sheets AFTER you do the medicine. If the other boys get a rash, call and let me know.  Kaleel Schmieder M. Tyana Butzer, M.D.

## 2012-12-26 NOTE — Progress Notes (Signed)
Patient ID: [name], female   DOB: 1965-04-20, 47 y.o.   MRN: [id number]    Subjective: HPI: Patient is a 47 y.o. female presenting to clinic today for follow up appointment. Concerns today include cough  1. Cough - Pt with history of asthma and sarcoid. She uses her inhalers, singulair and zyrtec regularly. She states with activity or cold weather her cough gets worse. She has used cough medication with codeine in the past at night time to help with the cough and would like refill. No fevers, no sputum production. Overall, she states she is feeling better.  2. HTN - Poor medication compliance with pills, however patch works well. BP elevated at triage with ambulation. She is also stressed with her husband's hospitalization. She denies HA, CP, abd pain, changes in vision. She is losing weight and has lost about 50lbs in last 5 months.  3. Rash- Evaluated yesterday for pruritic rash all over her body. Her son has similar rash. Appears to be insect bites vs. Scabies. Given permethrin which she has not used yet. Con't to itch   History Reviewed: Non smoker. Health Maintenance: UTD  ROS: Please see HPI above.  Objective: Office vital signs reviewed. BP 173/109  Pulse 93  Temp(Src) 98.1 F (36.7 C) (Oral)  Ht 5\' 3"  (1.6 m)  Wt 358 lb (162.388 kg)  BMI 63.43 kg/m2  Physical Examination:  General: Awake, alert. NAD HEENT: Atraumatic, normocephalic. MMM Pulm: CTAB, no wheezes. Good effort Cardio: RRR, no murmurs appreciated Abdomen: Obese, +BS, soft, nontender, nondistended Extremities: No edema Neuro: Grossly intact. Walks with cane  Assessment: 47 y.o. female follow up  Plan: See Problem List and After Visit Summary

## 2012-12-27 DIAGNOSIS — R21 Rash and other nonspecific skin eruption: Secondary | ICD-10-CM | POA: Insufficient documentation

## 2012-12-27 NOTE — Addendum Note (Signed)
Addended by: Garen Grams F on: 12/27/2012 05:23 PM   Modules accepted: Orders

## 2012-12-27 NOTE — Assessment & Plan Note (Signed)
Encouraged to use permethrin cream, and then wash her sheets in hot water. If other family members get similar rash she will let me know.

## 2012-12-27 NOTE — Assessment & Plan Note (Signed)
BP elevated. Con't Clonidine. Con't weight loss. Re-evaluate in 1-2 months when stress is less and after she has rested for a few minutes. Given red flag symptoms.

## 2012-12-27 NOTE — Assessment & Plan Note (Signed)
Con't current regimen. Cough medication with codeine refilled to be used only as needed for severe cough. Pt agrees.

## 2012-12-27 NOTE — Assessment & Plan Note (Addendum)
From 12/25/12 visit Scabies vs flees vs bedbugs.  permethrin for possible scabies.  Dispose bed if possible. Flee bomb bedrooms Wash sheets in hot water

## 2012-12-27 NOTE — Progress Notes (Signed)
Patient ID: [name]    DOB: 1965-04-28, 47 y.o.   MRN: [id number] --- Subjective:  Terrie is a 47 y.o.female who presents with rash. Her son developed a rash 3-4 days prior to visit and she started developing a similar rash with bumps on her arms a day or so afterwards.  Rash is very itchy. tried benadryl and rubbing alcohol which did not help. She says that on THursday of Thanksgiving they bought a used bed in which her son has been sleeping. Otherwise, no change in soaps, detergents, lotions. No fevers, no chills.   ROS: see HPI Past Medical History: reviewed and updated medications and allergies. Social History: Tobacco: none  Objective: Filed Vitals:   12/25/12 1600  BP: 137/83  Pulse: 85  Temp: 98.3 F (36.8 C)    Physical Examination:   General appearance - alert, well appearing, and in no distress Skin - left arm with pink papules and surrounding excoriation, no warmth or superinfection

## 2013-02-05 ENCOUNTER — Ambulatory Visit (INDEPENDENT_AMBULATORY_CARE_PROVIDER_SITE_OTHER): Payer: Medicaid Other | Admitting: *Deleted

## 2013-02-05 DIAGNOSIS — Z309 Encounter for contraceptive management, unspecified: Secondary | ICD-10-CM

## 2013-02-05 LAB — POCT URINE PREGNANCY: Preg Test, Ur: NEGATIVE

## 2013-02-05 MED ORDER — MEDROXYPROGESTERONE ACETATE 150 MG/ML IM SUSP
150.0000 mg | Freq: Once | INTRAMUSCULAR | Status: AC
  Administered 2013-02-05: 150 mg via INTRAMUSCULAR

## 2013-02-05 NOTE — Progress Notes (Signed)
Patient in today for depo, urine pregnancy test obtained as patient was 3 days late, with negative results. Injection given in left deltoid, patient without complaints, site unremarkable. Next depo due March 30 - April 3, patient aware.

## 2013-02-08 ENCOUNTER — Ambulatory Visit (INDEPENDENT_AMBULATORY_CARE_PROVIDER_SITE_OTHER): Payer: Medicaid Other | Admitting: Family Medicine

## 2013-02-08 ENCOUNTER — Encounter: Payer: Self-pay | Admitting: Family Medicine

## 2013-02-08 VITALS — BP 140/94 | HR 102 | Temp 99.2°F | Wt 361.0 lb

## 2013-02-08 DIAGNOSIS — M25561 Pain in right knee: Secondary | ICD-10-CM

## 2013-02-08 DIAGNOSIS — M25562 Pain in left knee: Secondary | ICD-10-CM

## 2013-02-08 DIAGNOSIS — I10 Essential (primary) hypertension: Secondary | ICD-10-CM

## 2013-02-08 DIAGNOSIS — G4733 Obstructive sleep apnea (adult) (pediatric): Secondary | ICD-10-CM

## 2013-02-08 DIAGNOSIS — M25569 Pain in unspecified knee: Secondary | ICD-10-CM

## 2013-02-08 MED ORDER — HYDROCODONE-ACETAMINOPHEN 10-325 MG PO TABS: 1.0000 | ORAL_TABLET | Freq: Three times a day (TID) | ORAL | Status: DC | PRN

## 2013-02-08 NOTE — Assessment & Plan Note (Signed)
Pt to make appt w/ ortho for another appt Refill NOrco today

## 2013-02-08 NOTE — Progress Notes (Signed)
[name] is a 48 y.o. female who presents to [facility] today for SD appt for knee pain   Knee pain: chronic condition for pt. Bilat. No change in overall condition. Out of pain medications. Pt to make appt for ortho  HTN: Taking medications. CP, palpitations, syncope.   OSA: No home o2 requirement. Not using CPAP at home due to clostrophobia.   The following portions of the patient's history were reviewed and updated as appropriate: allergies, current medications, past medical history, family and social history, and problem list.    Past Medical History  Diagnosis Date  . Myasthenia gravis 1994    Positive Acetylcholine receptor Ab and single fiber EMG Dr. Clarisa [facility]- Dr. Noreene Filbert 1998, Dr Sharene Skeans 2008 [facility]- Failed prednisone due to weight gain, stopped imuran/mestinon due to finances- Now with quiescent disease per Dr Sharene Skeans and no flares in many years  . 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 Coralyn Helling- 10/30/2007 ANA positive, ANA titer  negative, RF less than 20  . ALLERGIC RHINITIS     takes Zyrtec daily  . OSA (obstructive sleep apnea)     RDI 10 on PSG 10/09  . Hypoventilation associated with obesity syndrome   . Pelvic inflammatory disease (PID)   . Viral meningitis     history of viral meningitis  . Irritable bladder   . Hypertension     takes Prinizide daily and Clonidine on Mondays  . Asthma     Albuterol prn;Symbicort daily and Singulair daily  . Shortness of breath     can be sitting as well as exertion  . Pneumonia     hx of;last time about 1-59yrs ago  . Bronchitis     hx of  . Headache(784.0)     couple of times a week  . Dizziness     pt states she gets off balance occasionally  . Arthritis   . Joint pain   . Joint swelling   . Myasthenia gravis   . Chronic back pain   . Osteoarthritis   . Rheumatoid arthritis(714.0)   . Carpal tunnel syndrome     right  . Eczema   . Trigger finger    . GERD (gastroesophageal reflux disease)     takes Omeprazole daily  . Peptic ulcer   . Constipation     Miralax prn  . Hemorrhoids   . Urinary frequency     takes Ditropan daily  . Urinary urgency   . Nocturia   . Anxiety   . Depression     takes Cymbalta daily  . Insomnia   . Lung disease     scleraderma  . Skin irritation     skin itchy    ROS as above otherwise neg.    Medications reviewed. Current Outpatient Prescriptions  Medication Sig Dispense Refill  . budesonide-formoterol (SYMBICORT) 160-4.5 MCG/ACT inhaler Inhale 2 puffs into the lungs 2 (two) times daily.  10.2 g  11  . cetirizine (ZYRTEC) 10 MG tablet Take 1 tablet (10 mg total) by mouth daily.  30 tablet  11  . cloNIDine (CATAPRES-TTS-3) 0.3 mg/24hr patch Place 1 patch (0.3 mg total) onto the skin once a week.  4 patch  12  . FLUoxetine (PROZAC) 40 MG capsule Take 1 capsule (40 mg total) by mouth daily.  30 capsule  11  . furosemide (LASIX) 20 MG tablet TAKE 1 TABLET (20 MG TOTAL) BY MOUTH  DAILY.  30 tablet  5  . HYDROcodone-acetaminophen (NORCO) 10-325 MG per tablet Take 1 tablet by mouth every 8 (eight) hours as needed.  60 tablet  0  . lisinopril-hydrochlorothiazide (PRINZIDE,ZESTORETIC) 20-12.5 MG per tablet Take 1 tablet by mouth daily.  30 tablet  11  . medroxyPROGESTERone (DEPO-PROVERA) 150 MG/ML injection Inject 150 mg into the muscle every 3 (three) months.       . montelukast (SINGULAIR) 10 MG tablet TAKE 1 TABLET BY MOUTH ONCE A DAY  30 tablet  11  . omeprazole (PRILOSEC) 40 MG capsule TAKE ONE CAPSULE BY MOUTH TWICE A DAY  30 capsule  11  . oxybutynin (DITROPAN) 5 MG tablet TAKE 1 TABLET BY MOUTH TWICE A DAY  60 tablet  11  . permethrin (ELIMITE) 5 % cream Apply 1 application topically once.  120 g  1  . POLYETHYLENE GLYCOL 3350 PO Take by mouth daily as needed. Patient takes 17g daily      . predniSONE (DELTASONE) 10 MG tablet TAKE 1 TABLET (10 MG TOTAL) BY MOUTH DAILY.  30 tablet  11  . PROAIR HFA  108 (90 BASE) MCG/ACT inhaler USE 2 PUFFS EVERY 4 HOURS AS NEEDED  8.5 each  3  . [DISCONTINUED] oxybutynin (DITROPAN) 5 MG tablet TAKE 1 TABLET BY MOUTH 2 TIMES A DAY  60 tablet  2   No current facility-administered medications for this visit.    Exam:  BP 140/94  Pulse 102  Temp(Src) 99.2 F (37.3 C) (Oral)  Wt 361 lb (163.749 kg) Gen: Well NAD HEENT: EOMI,  MMM   No results found for this or any previous visit (from the past 72 hour(s)).  A/P (as seen in Problem list)  Uncontrolled, HTN Elevated today by pt. Taking all meds Some elevation today likely from pain  Previous reads nml No change in regimen at this time  Knee pain, bilateral Pt to make appt w/ ortho for another appt Refill NOrco today   OBSTRUCTIVE SLEEP APNEA Not using CPAP due to feelings of clostrophobia Hand written Rx for nasal pillow given for pt to [facility].

## 2013-02-08 NOTE — Assessment & Plan Note (Signed)
Elevated today by pt. Taking all meds Some elevation today likely from pain  Previous reads nml No change in regimen at this time

## 2013-02-08 NOTE — Assessment & Plan Note (Signed)
Not using CPAP due to feelings of clostrophobia Hand written Rx for nasal pillow given for pt to take to [facility].

## 2013-02-08 NOTE — Patient Instructions (Signed)
You are doing well overall Please continue all of your medications as prescribed Please take your prescription for the [facility] or other provider of home CPAP equipment  Please follow up with the orthopedic surgeon prior to your next appointment.  Have a great day.

## 2013-03-02 ENCOUNTER — Encounter: Payer: Self-pay | Admitting: Family Medicine

## 2013-03-02 ENCOUNTER — Ambulatory Visit (INDEPENDENT_AMBULATORY_CARE_PROVIDER_SITE_OTHER): Payer: Medicaid Other | Admitting: Family Medicine

## 2013-03-02 VITALS — BP 136/80 | Wt 361.0 lb

## 2013-03-02 DIAGNOSIS — M179 Osteoarthritis of knee, unspecified: Secondary | ICD-10-CM

## 2013-03-02 DIAGNOSIS — IMO0002 Reserved for concepts with insufficient information to code with codable children: Secondary | ICD-10-CM

## 2013-03-02 DIAGNOSIS — M1710 Unilateral primary osteoarthritis, unspecified knee: Secondary | ICD-10-CM

## 2013-03-02 MED ORDER — METHYLPREDNISOLONE ACETATE 40 MG/ML IJ SUSP
40.0000 mg | Freq: Once | INTRAMUSCULAR | Status: AC
  Administered 2013-03-02: 40 mg via INTRA_ARTICULAR

## 2013-03-02 NOTE — Progress Notes (Signed)
   Subjective:    Patient ID: [name], female    DOB: 1965/05/13, 48 y.o.   MRN: [id number]  HPI Bilateral knee pain. She only has about 2-3 weeks of good relief from the corticosteroid injections. She wants to discuss getting other options including knee replacement.   PERTINENT  PMH / PSH: Morbid obesity-Her last year she's lost about 60 pounds. She is on chronic prednisone therapy. She has history of some chronic pulmonary fibrosis. All of these issues would complicate total knee replacement.    Review of Systems Denies unusual weight change fever, sweats, chills.    Objective:   Physical Exam  Vital signs are reviewed General: Well-developed obese female no acute distress in KNEES: Bilaterally she has full extension and flexion. Tender to palpation bilaterally the medial joint line and on the right knee she is also tender in the lateral joint line. Calves are soft bilaterally. Distally she has intact neurovascular status.  INJECTION: Patient was given informed consent, signed copy in the chart. Appropriate time out was taken. Area prepped and draped in usual sterile fashion. One cc of methylprednisolone 40 mg/ml plus  4cc of 1% lidocaine without epinephrine was injected into bilateral knee using a(n) anterior medial approach. The patient tolerated the procedure well. There were no complications. Post procedure instructions were given.       Assessment & Plan:  #1. Severe bilateral knee pain secondary to advanced DJD.  #2. Morbid obesity. Plan. Long discussion about her options. I Norva Riffle that she has been to lose 60 pounds by her report. She loses another 60 at this time next year I think we might have a better chance of pursuing total knee replacement. She also has some other medical issues that might complicate her surgery such as her lung disease however I think these would be less of a risk if she were able to decrease her BMI. Today we gave her some more corticosteroid  injections. Like to see her back in 4 weeks if this is about the time that typically she's starting to hurt again. We did discuss possible Visco supplementation other opportunities. I'll see her back in one month. Probably also need it repeat films at that time Greater than 50% of our 35 minute office visit was spent in counseling and education regarding these issues.

## 2013-03-30 ENCOUNTER — Ambulatory Visit: Payer: Self-pay | Admitting: Family Medicine

## 2013-04-02 ENCOUNTER — Encounter: Payer: Self-pay | Admitting: Family Medicine

## 2013-04-02 ENCOUNTER — Ambulatory Visit (INDEPENDENT_AMBULATORY_CARE_PROVIDER_SITE_OTHER): Payer: Medicaid Other | Admitting: Family Medicine

## 2013-04-02 VITALS — BP 146/88 | HR 92 | Ht 63.0 in | Wt 361.0 lb

## 2013-04-02 DIAGNOSIS — IMO0002 Reserved for concepts with insufficient information to code with codable children: Secondary | ICD-10-CM

## 2013-04-02 DIAGNOSIS — M1710 Unilateral primary osteoarthritis, unspecified knee: Secondary | ICD-10-CM

## 2013-04-02 DIAGNOSIS — M179 Osteoarthritis of knee, unspecified: Secondary | ICD-10-CM

## 2013-04-02 MED ORDER — METHYLPREDNISOLONE ACETATE 40 MG/ML IJ SUSP
40.0000 mg | Freq: Once | INTRAMUSCULAR | Status: AC
Start: 2013-04-02 — End: 2013-04-02
  Administered 2013-04-02: 40 mg via INTRA_ARTICULAR

## 2013-04-02 NOTE — Progress Notes (Signed)
   Subjective:    Patient ID: [name], female    DOB: 16-Feb-1965, 48 y.o.   MRN: [id number]  HPI  Are not followup bilateral knee injections one month ago. Left knee did great. She still having significant pain relief by about 75-85% in the left knee. The right knee never did get any relief. She had some mild numbness today the injection but never got any typical relief. Right knee also is now bothering her so much as she's having to use a cane constantly. It pops and feels like it's going to give way.  Review of Systems Denies fever, sweats, chills. She thinks she's lost some more weight but she's not really sure.    Objective:   Physical Exam  Vital signs are reviewed. We are unable to weigh her on our scale here. GENERAL: Well-developed obese female no acute distress RIGHT knee: To palpation medial joint line. No effusion no erythema or warmth. Calf is soft. Mild crepitus with extension but she has full range of motion flexion extension.  INJECTION: Patient was given informed consent, signed copy in the chart. Appropriate time out was taken. Area prepped and draped in usual sterile fashion. One cc of methylprednisolone 40 mg/ml plus  4 cc of 1% lidocaine without epinephrine was injected into the right knee using a(n) anterior medial approach approach. Given her habitus is a was a little difficult to decide whether or not we've actually gotten it in the joint, today after 3 placement trials I'm pretty sure I did get it into the The patient tolerated the procedure well. There were no complications. Post procedure instructions were given.        Assessment & Plan:  Bilateral knee pain with some relief last time from her corticosteroid injection. The right knee had no relief which is atypical for her. I suspect either she is at the end of corticosteroid injections been helpful in the right knee or to perhaps did not get the injection placed appropriately given her difficult landmarks  and body habitus. We discussed options and opted today to reinject her right knee. She'll let me iknow know how she does.

## 2013-04-23 ENCOUNTER — Ambulatory Visit (INDEPENDENT_AMBULATORY_CARE_PROVIDER_SITE_OTHER): Payer: Medicaid Other | Admitting: *Deleted

## 2013-04-23 DIAGNOSIS — Z309 Encounter for contraceptive management, unspecified: Secondary | ICD-10-CM

## 2013-04-23 MED ORDER — MEDROXYPROGESTERONE ACETATE 150 MG/ML IM SUSP
150.0000 mg | Freq: Once | INTRAMUSCULAR | Status: AC
  Administered 2013-04-23: 150 mg via INTRAMUSCULAR

## 2013-04-23 NOTE — Progress Notes (Signed)
   Pt in for Depo Provera injection.  Pt tolerated Depo injection. Depo given Left upper outer quadrant.  Next injection due June 15 - July 23, 2013.  Reminder card given. Martin, Tamika L, RN   

## 2013-04-25 ENCOUNTER — Encounter: Payer: Self-pay | Admitting: Family Medicine

## 2013-04-25 ENCOUNTER — Ambulatory Visit (INDEPENDENT_AMBULATORY_CARE_PROVIDER_SITE_OTHER): Payer: Medicaid Other | Admitting: Family Medicine

## 2013-04-25 VITALS — Temp 98.5°F | Ht 64.0 in | Wt 367.4 lb

## 2013-04-25 DIAGNOSIS — IMO0002 Reserved for concepts with insufficient information to code with codable children: Secondary | ICD-10-CM

## 2013-04-25 DIAGNOSIS — M179 Osteoarthritis of knee, unspecified: Secondary | ICD-10-CM

## 2013-04-25 DIAGNOSIS — M1710 Unilateral primary osteoarthritis, unspecified knee: Secondary | ICD-10-CM

## 2013-04-25 MED ORDER — OXYCODONE-ACETAMINOPHEN 5-325 MG PO TABS: 1.0000 | ORAL_TABLET | Freq: Three times a day (TID) | ORAL | Status: DC | PRN

## 2013-04-25 NOTE — Assessment & Plan Note (Signed)
Progressively worsening of right knee pain. Medial joint line pain. She has not responded to the steroid injections, so it is time for the next step possibly viscous injections.  - Will try Percocet for pain since Vicodin no longer working. Informed patient that she will not be escalated on this medication and if it is not more effective, she should go back to the Vicodin. She agrees. - Refer back to ortho. Previously seen at [facility], will place referral back to them. - F/u in 6 weeks or sooner if needed.

## 2013-04-25 NOTE — Progress Notes (Signed)
Patient ID: [name], female   DOB: December 11, 1965, 48 y.o.   MRN: [id number]    Subjective: HPI: Patient is a 48 y.o. female presenting to clinic today for follow up appointment. Concerns today include knee pain  Knee pain - Patient with severe DJD of knees. Followed in [facility] by Dr. Jennette Kettle for steroid injections. Her right knee has been bothering her more and she has not had any improvement with the last 2 injections. At this point, her DJD is probably to the point where she needs more intervention, and patient agrees. She is requesting refill on her pain medication and has asked for something more immediate. She does not take Vicodin every day, only as needed. Her last Rx was in January and it has lasted >2 months. She has not seen Ortho for her knees in many years, but last saw [place] for her carpal tunnel in 2012.  History Reviewed: Non smoker. Health Maintenance: UTD  ROS: Please see HPI above.  Objective: Office vital signs reviewed. Temp(Src) 98.5 F (36.9 C) (Oral)  Ht 5\' 4"  (1.626 m)  Wt 367 lb 6.4 oz (166.652 kg)  BMI 63.03 kg/m2  Physical Examination:  General: Awake, alert. NAD HEENT: Atraumatic, normocephalic. MMM Pulm: CTAB, no wheezes! Good effort.  Cardio: RRR, no murmurs appreciated Abdomen: Obese, +BS, soft, nontender, nondistended Extremities: Large lower extremities. Able to palpate medial joint line of right knee, extremely TTP. Unable to appreciate if she has edema in joint. Neuro: Walks with a cane. Sensation distally intact.  Assessment: 48 y.o. female follow up knee pain  Plan: See Problem List and After Visit Summary

## 2013-04-25 NOTE — Patient Instructions (Signed)
I have referred you back to ortho.  Try the new pain medication, let me know if it does not work and we will switch back to the Norco.  I will see you back before the end of June.  Adalid Beckmann M. Kanan Sobek, M.D.

## 2013-05-01 ENCOUNTER — Other Ambulatory Visit: Payer: Self-pay | Admitting: Family Medicine

## 2013-05-25 ENCOUNTER — Ambulatory Visit (INDEPENDENT_AMBULATORY_CARE_PROVIDER_SITE_OTHER): Payer: Medicaid Other | Admitting: Family Medicine

## 2013-05-25 ENCOUNTER — Encounter: Payer: Self-pay | Admitting: Family Medicine

## 2013-05-25 VITALS — BP 178/84 | HR 99 | Temp 99.8°F | Wt 372.5 lb

## 2013-05-25 DIAGNOSIS — M25561 Pain in right knee: Secondary | ICD-10-CM

## 2013-05-25 DIAGNOSIS — M25562 Pain in left knee: Secondary | ICD-10-CM

## 2013-05-25 DIAGNOSIS — M25569 Pain in unspecified knee: Secondary | ICD-10-CM

## 2013-05-25 DIAGNOSIS — R059 Cough, unspecified: Secondary | ICD-10-CM

## 2013-05-25 DIAGNOSIS — R05 Cough: Secondary | ICD-10-CM

## 2013-05-25 MED ORDER — AZITHROMYCIN 250 MG PO TABS: ORAL_TABLET | ORAL | Status: AC

## 2013-05-25 MED ORDER — PREDNISONE 50 MG PO TABS: 50.0000 mg | ORAL_TABLET | Freq: Every day | ORAL | Status: DC

## 2013-05-25 MED ORDER — OXYCODONE-ACETAMINOPHEN 7.5-325 MG PO TABS: 1.0000 | ORAL_TABLET | Freq: Three times a day (TID) | ORAL | Status: DC | PRN

## 2013-05-25 MED ORDER — GUAIFENESIN-CODEINE 100-10 MG/5ML PO SYRP: 5.0000 mL | ORAL_SOLUTION | Freq: Three times a day (TID) | ORAL | Status: AC | PRN

## 2013-05-25 MED ORDER — MELOXICAM 15 MG PO TABS: 15.0000 mg | ORAL_TABLET | Freq: Every day | ORAL | Status: DC

## 2013-05-25 NOTE — Patient Instructions (Signed)
I am sorry you are not feeling well.  - Prednisone 50 for 5 days, then back to your 10mg  - Azithromycin x5 days. Take 2 tabs today, then 1 tab daily - Cough medication as needed  I have increased your pain medicine.  Dauna Ziska M. Cordaro Mukai, M.D.

## 2013-05-25 NOTE — Progress Notes (Signed)
Patient ID: [name], female   DOB: 1965-08-16, 48 y.o.   MRN: [id number]    Subjective: HPI: Patient is a 48 y.o. female presenting to clinic today for office visit. Concerns today include cough  1. Cough- Productive cough x3 days. Yellow, thick sputum. She states she has ad URI symptoms for over a week. She has a history of asthma but this does not feel the same. She states she feels like this is fluid building up because she has diffuse swelling and feels life she is drowning. States that in the past when she felt this way she was diagnosed with fibrosis. She has tried OTC medication, prescription cough medication. Nothing makes it better.  2. Chronic pain- Needs refill on Percocet for her pain. States she has to take 2 pills at times for pain in her legs to keep functional  History Reviewed: Never smoker. Health Maintenance: UTD  ROS: Please see HPI above.  Objective: Office vital signs reviewed. BP 178/84  Pulse 99  Temp(Src) 99.8 F (37.7 C) (Oral)  Wt 372 lb 8 oz (168.965 kg)  SpO2 98%  Physical Examination:  General: Awake, alert. NAD HEENT: Atraumatic, normocephalic. MMM. Neck: No masses palpated. No LAD Pulm: CTAB, no wheezes, but decreased breath sounds in all lung fields. Cardio: RRR, no murmurs appreciated Abdomen:+BS, soft, nontender, nondistended Extremities: Large legs, 1+ edema Neuro: Grossly intact, walks with a cane  Assessment: 48 y.o. female follow up  Plan: See Problem List and After Visit Summary

## 2013-05-26 DIAGNOSIS — R059 Cough, unspecified: Secondary | ICD-10-CM | POA: Insufficient documentation

## 2013-05-26 DIAGNOSIS — R05 Cough: Secondary | ICD-10-CM | POA: Insufficient documentation

## 2013-05-26 NOTE — Assessment & Plan Note (Signed)
A: Cough most likely related to post nasal drip, however, given her lung conditions I feel it would be better to treat her as an acute asthma exacerbation.   P: - INcreased prednisone to 50mg  x5 days then back down to 10mg  daily - Zpak x5 days - Albuterol prn - F/u if fails to improve

## 2013-05-26 NOTE — Assessment & Plan Note (Signed)
A: Con't to have knee pain. Getting injections.  P: - Increase Percocet to 7.5/325 to prevent her from taking too much Tylenol  - F/u as needed

## 2013-06-25 ENCOUNTER — Ambulatory Visit: Payer: Self-pay | Admitting: Family Medicine

## 2013-07-09 ENCOUNTER — Encounter: Payer: Self-pay | Admitting: Family Medicine

## 2013-07-09 ENCOUNTER — Ambulatory Visit (INDEPENDENT_AMBULATORY_CARE_PROVIDER_SITE_OTHER): Payer: Medicaid Other | Admitting: Family Medicine

## 2013-07-09 VITALS — BP 174/94 | HR 96 | Temp 98.6°F | Wt 371.0 lb

## 2013-07-09 DIAGNOSIS — M1710 Unilateral primary osteoarthritis, unspecified knee: Secondary | ICD-10-CM

## 2013-07-09 DIAGNOSIS — Z309 Encounter for contraceptive management, unspecified: Secondary | ICD-10-CM

## 2013-07-09 DIAGNOSIS — IMO0002 Reserved for concepts with insufficient information to code with codable children: Secondary | ICD-10-CM

## 2013-07-09 DIAGNOSIS — M179 Osteoarthritis of knee, unspecified: Secondary | ICD-10-CM

## 2013-07-09 MED ORDER — MEDROXYPROGESTERONE ACETATE 150 MG/ML IM SUSP
150.0000 mg | Freq: Once | INTRAMUSCULAR | Status: AC
  Administered 2013-07-09: 150 mg via INTRAMUSCULAR

## 2013-07-09 MED ORDER — SLIP-ON KNEE BRACE MISC: Status: DC

## 2013-07-09 MED ORDER — HYDROCODONE-ACETAMINOPHEN 10-325 MG PO TABS: 1.0000 | ORAL_TABLET | Freq: Three times a day (TID) | ORAL | Status: DC | PRN

## 2013-07-09 MED ORDER — OXYCODONE-ACETAMINOPHEN 7.5-325 MG PO TABS: 1.0000 | ORAL_TABLET | Freq: Three times a day (TID) | ORAL | Status: DC | PRN

## 2013-07-09 NOTE — Patient Instructions (Signed)
I am sorry things are not going well with your knee.  Take the Norco rather than the percocet to see if it helps you more. Please have [employer] call me if they have any concerns about the braces.  Follow up in 1 month to meet your new doctor for your refills.  It has been a pleasure taking care of you and your family! Pamila Mendibles M. Evalee Gerard, M.D.

## 2013-07-09 NOTE — Assessment & Plan Note (Signed)
A: Progressively worsening DJD. Patient is seeking second opinion from ortho at this time.   P: - Switch back to norco 10/325. (Percocet printed, but void/shred prior to patient receiving Rx.) - Brace for knee ordered, she will take to [facility] - Continue to stay active. Patient seems like she is ready to give up activity but encouraged to keep moving - F/u in 1 month for refill

## 2013-07-09 NOTE — Progress Notes (Signed)
Patient ID: [name], female   DOB: 02/28/1965, 48 y.o.   MRN: [id number]    Subjective: HPI: Patient is a 48 y.o. female presenting to clinic today for follow up DJD.  DJD - Right knee worse than left. She has had all 5 Synvisc injections at [facility] (Dr. Ave Filter), which made it worse. Relies on cane to walk. Not a candidate for knee replacement due to weight. She is on Percocet 7.5/325mg  and takes 2-3 pills per day to help with ambulation but she thinks the Vicodin helped her more. Requesting braces to help with the pain.  History Reviewed: Never smoker. Health Maintenance: UTD  ROS: Please see HPI above.  Objective: Office vital signs reviewed. BP 174/94  Pulse 96  Temp(Src) 98.6 F (37 C) (Oral)  Wt 371 lb (168.284 kg)  Physical Examination:  General: Awake, alert. NAD HEENT: Atraumatic, normocephalic Pulm: CTAB, no wheezes Cardio: RRR, no murmurs appreciated Extremities: Exam limited by body habitus but extremely TTP along R medial joint line. Difficulty going from sitting to standing and taking first step Neuro: Walks with cane  Assessment: 48 y.o. female follow up  Plan: See Problem List and After Visit Summary

## 2013-07-18 ENCOUNTER — Encounter: Payer: Self-pay | Admitting: Family Medicine

## 2013-07-18 NOTE — Progress Notes (Signed)
Will send to MD to place referral. Burnard Hawthorne

## 2013-07-18 NOTE — Progress Notes (Signed)
Patient stopped by to let you know ortho dr she wants to be referred to.  Dr. Myrtie Neither  [phone]  Appointment is scheduled for July 1 at 2:30 (does not want you to say it is for a second opinion)

## 2013-07-19 ENCOUNTER — Other Ambulatory Visit: Payer: Self-pay | Admitting: Family Medicine

## 2013-07-19 DIAGNOSIS — M174 Other bilateral secondary osteoarthritis of knee: Secondary | ICD-10-CM

## 2013-07-19 NOTE — Progress Notes (Signed)
Referral placed.  Amber M. Hairford, M.D.

## 2013-07-19 NOTE — Progress Notes (Signed)
Office is closed on Thursday.  Will tray again tomorrow. [name], Maryjo [place]

## 2013-07-20 ENCOUNTER — Encounter: Payer: Self-pay | Admitting: Family Medicine

## 2013-07-20 NOTE — Progress Notes (Signed)
Given NPI number .[name], Maryjo [place]

## 2013-08-03 ENCOUNTER — Ambulatory Visit
Admission: RE | Admit: 2013-08-03 | Discharge: 2013-08-03 | Disposition: A | Discharge status: Home / Self Care | Payer: Medicaid Other | Source: Ambulatory Visit | Attending: Orthopedic Surgery | Admitting: Orthopedic Surgery

## 2013-08-03 ENCOUNTER — Ambulatory Visit (INDEPENDENT_AMBULATORY_CARE_PROVIDER_SITE_OTHER): Payer: Medicaid Other | Admitting: Family Medicine

## 2013-08-03 ENCOUNTER — Other Ambulatory Visit: Payer: Self-pay | Admitting: Orthopedic Surgery

## 2013-08-03 VITALS — BP 122/90 | HR 96 | Temp 98.3°F | Wt 375.0 lb

## 2013-08-03 DIAGNOSIS — E739 Lactose intolerance, unspecified: Secondary | ICD-10-CM

## 2013-08-03 DIAGNOSIS — R52 Pain, unspecified: Secondary | ICD-10-CM

## 2013-08-03 DIAGNOSIS — M174 Other bilateral secondary osteoarthritis of knee: Secondary | ICD-10-CM

## 2013-08-03 DIAGNOSIS — M175 Other unilateral secondary osteoarthritis of knee: Secondary | ICD-10-CM

## 2013-08-03 DIAGNOSIS — I10 Essential (primary) hypertension: Secondary | ICD-10-CM

## 2013-08-03 LAB — BASIC METABOLIC PANEL
BUN: 14 mg/dL (ref 6–23)
CALCIUM: 9.2 mg/dL (ref 8.4–10.5)
CO2: 25 meq/L (ref 19–32)
Chloride: 106 mEq/L (ref 96–112)
Creat: 0.72 mg/dL (ref 0.50–1.10)
Glucose, Bld: 123 mg/dL — ABNORMAL HIGH (ref 70–99)
Potassium: 4.3 mEq/L (ref 3.5–5.3)
Sodium: 143 mEq/L (ref 135–145)

## 2013-08-03 MED ORDER — HYDROCODONE-ACETAMINOPHEN 10-325 MG PO TABS: 1.0000 | ORAL_TABLET | Freq: Three times a day (TID) | ORAL | Status: DC | PRN

## 2013-08-03 NOTE — Patient Instructions (Addendum)
Nice to meet you. I have given a refill on your pain medication. Please set up a follow-up appointment for 1 month. Your blood pressure is at a good level. We will continue to monitor this.

## 2013-08-03 NOTE — Progress Notes (Signed)
Patient ID: [name], female   DOB: 1965/09/24, 48 y.o.   MRN: [id number]  Sara Alar, MD Phone: [phone]  [name] is a 48 y.o. female who presents today for f/u.  Osteoarthitis of the knees: patient has been to see a new orthopedist for a 2nd opinion. They placed her on a 3 day course of steroids which helped tremendously. They also placed her on a new antiinflammatory medication which she does not recall the name of. This helped a lot as well. She takes this twice a day. She is still taking the vicodin twice a day, though occasionally only needs it once a day. She needs her monthly refill of vicodin. She notes she has f/u with ortho next week. They have not discussed joint replacement yet.  HYPERTENSION Disease Monitoring Home BP Monitoring not checking Chest pain- no    Dyspnea- no Medications Compliance-  taking.  Edema- mild amount, has been taking lasix 20 mg daily for this. Improves with elevation of her legs.   ROS: Per HPI   Physical Exam Filed Vitals:   08/03/13 1409  BP: 122/90  Pulse: 96  Temp: 98.3 F (36.8 C)    Gen: Well NAD HEENT: PERRL,  MMM Lungs: CTABL Nl WOB Heart: RRR no MRG MSK: exam limited by body habitus, though there is no tenderness to palpation of the joint today Neuro: sensation to light touch intact in bilateral LE, 5/5 strength in quads and hamstings Exts: trace edema BL  LE, warm and well perfused.    Assessment/Plan: Please see individual problem list.  # Healthcare maintenance: up to date

## 2013-08-03 NOTE — Assessment & Plan Note (Signed)
Is at goal on recheck today. Will continue current dosing of medications. Will check BMET today for renal function. F/u in 3 months.

## 2013-08-03 NOTE — Assessment & Plan Note (Signed)
Patient with extensive arthritic changes of both knees on imaging. She is to keep f/u with orhto next week to discuss additional treatment options. Will refill vicodin today. F/u for refill on this medication in one month. May be able to space out to every 3 months for refills in the future.

## 2013-08-07 ENCOUNTER — Encounter: Payer: Self-pay | Admitting: Family Medicine

## 2013-09-10 ENCOUNTER — Ambulatory Visit: Payer: Self-pay | Admitting: Family Medicine

## 2013-09-12 ENCOUNTER — Ambulatory Visit (INDEPENDENT_AMBULATORY_CARE_PROVIDER_SITE_OTHER): Payer: Medicaid Other | Admitting: Family Medicine

## 2013-09-12 VITALS — BP 140/100 | HR 112 | Temp 98.2°F

## 2013-09-12 DIAGNOSIS — I10 Essential (primary) hypertension: Secondary | ICD-10-CM

## 2013-09-12 DIAGNOSIS — M175 Other unilateral secondary osteoarthritis of knee: Secondary | ICD-10-CM

## 2013-09-12 DIAGNOSIS — M174 Other bilateral secondary osteoarthritis of knee: Secondary | ICD-10-CM

## 2013-09-12 MED ORDER — HYDROCODONE-ACETAMINOPHEN 10-325 MG PO TABS: 1.0000 | ORAL_TABLET | Freq: Three times a day (TID) | ORAL | Status: DC | PRN

## 2013-09-12 NOTE — Progress Notes (Signed)
Patient ID: [name], female   DOB: [birth year], 48 y.o.   MRN: [id number]  [name], MD Phone: [phone]  [name] is a 48 y.o. female who presents today for f/u.  Osteoarthritis: reports that she has seen ortho. They started her on etodolac and dexamethasone for her pain in addition to her vicodin. She reports that occasionally the pain is unbearable and when this occurs she takes the vicodin 3x/daily. She feels as though the additional medications have proven beneficial.   HYPERTENSION Disease Monitoring Home BP Monitoring not checking Chest pain- no    Dyspnea- no Medications Compliance-  taking.  She notes issues with the water from her aquatics class keeping the catapres patch from sticking. She notes when she is not able to get it to stick, she doubles up on her lisinopril. Edema- no  Morbid obesity: She has been going to an aquatics class and has changed her diet per ortho's instructions. She notes she does not keep track of her weight at home. She feels the exercise has helped her arthritic pain. She notes mostly only eating one meal a day and not drinking much water, though she was told she needed to eat 3 meals and drink more water, both of which she has tried to do.  PMH: new medications etodolac 400 mg PO BID and dexamethasone 2 mg daily prn for arthritic flares added to med list.   ROS: Per HPI   Physical Exam Filed Vitals:   09/12/13 1445  BP: 140/100  Pulse: 112  Temp: 98.2 F (36.8 C)     Gen: Well NAD  Lungs: CTABL Nl WOB  Heart: RRR no MRG  MSK: exam limited by body habitus, though there is no tenderness to palpation of the joint today, no apparent swelling  Exts: no edema BL LE, warm and well perfused.    Assessment/Plan: Please see individual problem list.  # Healthcare maintenance: needs flu shot when available  [name], MD [facility] PGY-3

## 2013-09-12 NOTE — Assessment & Plan Note (Signed)
Stable at this time. She is to continue current regimen. Given vicodin #90 tablets and refills for today, 30 days from now, and 60 days from now. F/u in 3 months.

## 2013-09-12 NOTE — Assessment & Plan Note (Signed)
Has started to exercise and change her diet. I congratulated her on this and encouraged the continuance of this. She is has not lost weight at this time. She is to continue her current exercise and continue to work on her diet. F/u in one month.

## 2013-09-12 NOTE — Assessment & Plan Note (Signed)
Uncontrolled today. Is likely due to inconsistent use of catapres patch. Etodolac could also be a contributing cause. I discussed this with the patient and she would prefer to continue the etodolac at this time and see if working to keep the patch in place would be beneficial. Discussed water proof bandages for this. If she is not able to keep this in place we will need to consider alternative medications. F/u in one week for nursing visit and with me in 1 month.

## 2013-09-12 NOTE — Patient Instructions (Signed)
Nice to see you. Please continue to work on your diet and exercise. You are doing a great job at this time.  You should try a water occlusive bandage that can be obtained at the pharmacy.

## 2013-10-02 ENCOUNTER — Other Ambulatory Visit: Payer: Self-pay | Admitting: *Deleted

## 2013-10-02 MED ORDER — ALBUTEROL SULFATE HFA 108 (90 BASE) MCG/ACT IN AERS: INHALATION_SPRAY | RESPIRATORY_TRACT | Status: DC

## 2013-10-05 ENCOUNTER — Inpatient Hospital Stay (HOSPITAL_COMMUNITY)
Admission: EM | Admit: 2013-10-05 | Discharge: 2013-10-08 | DRG: 197 | Disposition: A | Discharge status: Home / Self Care | Payer: Medicaid Other | Attending: Family Medicine | Admitting: Family Medicine

## 2013-10-05 ENCOUNTER — Encounter (HOSPITAL_COMMUNITY): Payer: Self-pay | Admitting: Emergency Medicine

## 2013-10-05 DIAGNOSIS — E876 Hypokalemia: Secondary | ICD-10-CM | POA: Diagnosis present

## 2013-10-05 DIAGNOSIS — F3289 Other specified depressive episodes: Secondary | ICD-10-CM | POA: Diagnosis present

## 2013-10-05 DIAGNOSIS — Z91199 Patient's noncompliance with other medical treatment and regimen due to unspecified reason: Secondary | ICD-10-CM

## 2013-10-05 DIAGNOSIS — G7000 Myasthenia gravis without (acute) exacerbation: Secondary | ICD-10-CM | POA: Diagnosis present

## 2013-10-05 DIAGNOSIS — Z79899 Other long term (current) drug therapy: Secondary | ICD-10-CM

## 2013-10-05 DIAGNOSIS — Z6841 Body Mass Index (BMI) 40.0 and over, adult: Secondary | ICD-10-CM | POA: Diagnosis present

## 2013-10-05 DIAGNOSIS — K219 Gastro-esophageal reflux disease without esophagitis: Secondary | ICD-10-CM

## 2013-10-05 DIAGNOSIS — I10 Essential (primary) hypertension: Secondary | ICD-10-CM | POA: Diagnosis present

## 2013-10-05 DIAGNOSIS — Z9119 Patient's noncompliance with other medical treatment and regimen: Secondary | ICD-10-CM

## 2013-10-05 DIAGNOSIS — R0602 Shortness of breath: Secondary | ICD-10-CM | POA: Diagnosis present

## 2013-10-05 DIAGNOSIS — J8409 Other alveolar and parieto-alveolar conditions: Principal | ICD-10-CM | POA: Diagnosis present

## 2013-10-05 DIAGNOSIS — M069 Rheumatoid arthritis, unspecified: Secondary | ICD-10-CM | POA: Diagnosis present

## 2013-10-05 DIAGNOSIS — F411 Generalized anxiety disorder: Secondary | ICD-10-CM | POA: Diagnosis present

## 2013-10-05 DIAGNOSIS — F329 Major depressive disorder, single episode, unspecified: Secondary | ICD-10-CM | POA: Diagnosis present

## 2013-10-05 DIAGNOSIS — M1710 Unilateral primary osteoarthritis, unspecified knee: Secondary | ICD-10-CM | POA: Diagnosis present

## 2013-10-05 DIAGNOSIS — E8809 Other disorders of plasma-protein metabolism, not elsewhere classified: Secondary | ICD-10-CM | POA: Diagnosis present

## 2013-10-05 DIAGNOSIS — R0609 Other forms of dyspnea: Secondary | ICD-10-CM | POA: Diagnosis present

## 2013-10-05 DIAGNOSIS — Z23 Encounter for immunization: Secondary | ICD-10-CM

## 2013-10-05 DIAGNOSIS — E678 Other specified hyperalimentation: Secondary | ICD-10-CM | POA: Diagnosis present

## 2013-10-05 DIAGNOSIS — D72829 Elevated white blood cell count, unspecified: Secondary | ICD-10-CM | POA: Diagnosis present

## 2013-10-05 DIAGNOSIS — D869 Sarcoidosis, unspecified: Secondary | ICD-10-CM | POA: Diagnosis present

## 2013-10-05 DIAGNOSIS — J8410 Pulmonary fibrosis, unspecified: Secondary | ICD-10-CM | POA: Diagnosis present

## 2013-10-05 DIAGNOSIS — G4733 Obstructive sleep apnea (adult) (pediatric): Secondary | ICD-10-CM | POA: Diagnosis present

## 2013-10-05 DIAGNOSIS — E119 Type 2 diabetes mellitus without complications: Secondary | ICD-10-CM | POA: Diagnosis present

## 2013-10-05 DIAGNOSIS — IMO0002 Reserved for concepts with insufficient information to code with codable children: Secondary | ICD-10-CM

## 2013-10-05 DIAGNOSIS — J45909 Unspecified asthma, uncomplicated: Secondary | ICD-10-CM | POA: Diagnosis present

## 2013-10-05 DIAGNOSIS — E662 Morbid (severe) obesity with alveolar hypoventilation: Secondary | ICD-10-CM | POA: Diagnosis present

## 2013-10-05 DIAGNOSIS — E785 Hyperlipidemia, unspecified: Secondary | ICD-10-CM | POA: Diagnosis present

## 2013-10-05 LAB — BASIC METABOLIC PANEL
ANION GAP: 12 (ref 5–15)
BUN: 14 mg/dL (ref 6–23)
CO2: 27 meq/L (ref 19–32)
Calcium: 9.1 mg/dL (ref 8.4–10.5)
Chloride: 103 mEq/L (ref 96–112)
Creatinine, Ser: 0.64 mg/dL (ref 0.50–1.10)
GFR calc Af Amer: >90 mL/min (ref >90)
GFR calc non Af Amer: >90 mL/min (ref >90)
Glucose, Bld: 114 mg/dL — ABNORMAL HIGH (ref 70–99)
Potassium: 4 mEq/L (ref 3.7–5.3)
SODIUM: 142 meq/L (ref 137–147)

## 2013-10-05 LAB — CBC
HCT: 38.7 % (ref 36.0–46.0)
HEMOGLOBIN: 12.7 g/dL (ref 12.0–15.0)
MCH: 29 pg (ref 26.0–34.0)
MCHC: 32.8 g/dL (ref 30.0–36.0)
MCV: 88.4 fL (ref 78.0–100.0)
PLATELETS: 259 10*3/uL (ref 150–400)
RBC: 4.38 MIL/uL (ref 3.87–5.11)
RDW: 14.8 % (ref 11.5–15.5)
WBC: 10.8 10*3/uL — ABNORMAL HIGH (ref 4.0–10.5)

## 2013-10-05 LAB — TROPONIN I

## 2013-10-05 LAB — PRO B NATRIURETIC PEPTIDE: PRO B NATRI PEPTIDE: 132.4 pg/mL — AB (ref 0–125)

## 2013-10-05 NOTE — ED Provider Notes (Signed)
CSN: [id number]     Arrival date & time 10/05/13  2019 History   First MD Initiated Contact with Patient 10/05/13 2322     Chief Complaint  Patient presents with  . Shortness of Breath  . 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 Patient reports for the past 2 weeks she has been having worsening shortness of breath, abdominal pain, bilateral knee pain sensation of fluid gain throughout her body, and feeling weak and tired. Patient believes she is gaining fluid throughout her body, not improved with taking her Lasix. She sleeps with 3 pillows which has not changed. She endorses nasal congestion, occasional sore throat, and having cough productive with sputum. She endorses subjective fever, chills, and decrease in appetite. She denies any significant pleuritic chest pain, chest pressure, diaphoresis, or rash. She has been taking pain medication including Norco at home for chronic pain which provide some relief. The symptoms as progressive in nature and she would like to be evaluated for it.  Past Medical History  Diagnosis Date  . Myasthenia gravis 1994    Positive Acetylcholine receptor Ab and single fiber EMG Dr. Clarisa [facility]- Dr. Noreene Filbert 1998, Dr Sharene Skeans 2008 [facility]- Failed prednisone due to weight gain, stopped imuran/mestinon due to finances- Now with quiescent disease per Dr Sharene Skeans and no flares in many years  . 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 Coralyn Helling- 10/30/2007 ANA positive, ANA titer  negative, RF less than 20  . ALLERGIC RHINITIS     takes Zyrtec daily  . OSA (obstructive sleep apnea)      RDI 10 on PSG 10/09  . Hypoventilation associated with obesity syndrome   . Pelvic inflammatory disease (PID)   . Viral meningitis     history of viral meningitis  . Irritable bladder   . Hypertension     takes Prinizide daily and Clonidine on Mondays  . Asthma     Albuterol prn;Symbicort daily and Singulair daily  . Shortness of breath     can be sitting as well as exertion  . Pneumonia     hx of;last time about 1-62yrs ago  . Bronchitis     hx of  . Headache(784.0)     couple of times a week  . Dizziness     pt states she gets off balance occasionally  . Arthritis   . Joint pain   . Joint swelling   . Myasthenia gravis   . Chronic back pain   . Osteoarthritis   . Rheumatoid arthritis(714.0)   . Carpal tunnel syndrome     right  . Eczema   . Trigger finger   . GERD (gastroesophageal reflux disease)     takes Omeprazole daily  . Peptic ulcer   . Constipation     Miralax prn  . Hemorrhoids   . Urinary frequency     takes Ditropan daily  . Urinary urgency   . Nocturia   . Anxiety   . Depression     takes Cymbalta daily  . Insomnia   . Lung disease     scleraderma  . Skin irritation     skin itchy   Past Surgical History  Procedure Laterality Date  . Cesarean  section  09/15/2004  . Bronchoscopy  08/2007  . Thymus resection  08/25/1993  . Laparoscopic cholecystectomy  07/2008    by Dr. Cyndia Bent  . Cortisone injection      receives an injection every 26months  . Esophagogastroduodenoscopy    . Trigger finger release  05/20/2011    Procedure: RELEASE TRIGGER FINGER/A-1 PULLEY;  Surgeon: [name], MD;  Location: [facility] OR;  Service: Orthopedics;  Laterality: Right;  RIGHT TRIGGER THUMB RELEASE AND RIGHT CARPAL TUNNEL RELEASE  . Carpal tunnel release  05/20/2011    Procedure: CARPAL TUNNEL RELEASE;  Surgeon: [name], MD;  Location: [facility] OR;  Service: Orthopedics;  Laterality: Right;   Family History  Problem Relation Age of  Onset  . Hypertension Father   . Sickle cell trait Father   . Stroke Paternal Grandmother   . Sickle cell trait Paternal Grandfather   . Lupus    . Anesthesia problems Neg Hx   . Hypotension Neg Hx   . Malignant hyperthermia Neg Hx   . Pseudochol deficiency Neg Hx    History  Substance Use Topics  . Smoking status: Never Smoker   . Smokeless tobacco: Never Used  . Alcohol Use: No   OB History   Grav Para Term Preterm Abortions TAB SAB Ect Mult Living                 Review of Systems  All other systems reviewed and are negative.     Allergies  Apple; Banana; and Shrimp  Home Medications   Prior to Admission medications   Medication Sig Start Date End Date Taking? Authorizing Provider  albuterol (PROAIR HFA) 108 (90 BASE) MCG/ACT inhaler USE 2 PUFFS EVERY 4 HOURS AS NEEDED 10/02/13   Glori Luis, MD  budesonide-formoterol [facility]) 160-4.5 MCG/ACT inhaler Inhale 2 puffs into the lungs 2 (two) times daily. 12/26/12   Amber Nydia Bouton, MD  CATAPRES-TTS-3 0.3 MG/24HR patch PLACE 1 PATCH (0.3 MG TOTAL) ONTO THE SKIN ONCE A WEEK.    Amber Nydia Bouton, MD  cetirizine (ZYRTEC) 10 MG tablet Take 1 tablet (10 mg total) by mouth daily. 12/26/12   Amber Nydia Bouton, MD  dexamethasone (DECADRON) 4 MG tablet Take 2 mg by mouth daily as needed (arthritic flares).    Historical Provider, MD  Elastic Bandages & Supports (SLIP-ON KNEE BRACE) MISC Please evaluate/measure and dispense appropriate braces for severe DJD. Call [phone] with any questions. 07/09/13   Amber Nydia Bouton, MD  etodolac (LODINE) 400 MG tablet Take 400 mg by mouth 2 (two) times daily.    Historical Provider, MD  FLUoxetine (PROZAC) 40 MG capsule Take 1 capsule (40 mg total) by mouth daily. 12/26/12   Amber Nydia Bouton, MD  furosemide (LASIX) 20 MG tablet TAKE 1 TABLET (20 MG TOTAL) BY MOUTH DAILY. 08/19/12   Amber Nydia Bouton, MD  HYDROcodone-acetaminophen (NORCO) 10-325 MG per tablet Take 1 tablet by mouth every 8 (eight)  hours as needed for moderate pain. 09/12/13   Glori Luis, MD  HYDROcodone-acetaminophen (NORCO) 10-325 MG per tablet Take 1 tablet by mouth every 8 (eight) hours as needed. Please fill 30 days after the date on this prescription. 09/12/13   Glori Luis, MD  HYDROcodone-acetaminophen (NORCO) 10-325 MG per tablet Take 1 tablet by mouth every 8 (eight) hours as needed. Please fill 60 days after the date on this prescription. 09/12/13   Glori Luis, MD  lisinopril-hydrochlorothiazide (PRINZIDE,ZESTORETIC) 20-12.5 MG per tablet  Take 1 tablet by mouth daily. 12/26/12   Amber Nydia Bouton, MD  medroxyPROGESTERone (DEPO-PROVERA) 150 MG/ML injection Inject 150 mg into the muscle every 3 (three) months.     Historical Provider, MD  meloxicam (MOBIC) 15 MG tablet Take 1 tablet (15 mg total) by mouth daily. 05/25/13   Amber Nydia Bouton, MD  montelukast (SINGULAIR) 10 MG tablet TAKE 1 TABLET BY MOUTH ONCE A DAY 12/26/12   Hilarie Fredrickson, MD  omeprazole (PRILOSEC) 40 MG capsule TAKE ONE CAPSULE BY MOUTH TWICE A DAY 12/26/12   Hilarie Fredrickson, MD  oxybutynin (DITROPAN) 5 MG tablet TAKE 1 TABLET BY MOUTH TWICE A DAY 12/26/12   Amber Nydia Bouton, MD  POLYETHYLENE GLYCOL 3350 PO Take by mouth daily as needed. Patient takes 17g daily    Historical Provider, MD  predniSONE (DELTASONE) 10 MG tablet TAKE 1 TABLET (10 MG TOTAL) BY MOUTH DAILY. 12/26/12   Amber Nydia Bouton, MD   BP 156/98  Pulse 88  Temp(Src) 98.9 F (37.2 C) (Oral)  Resp 22  SpO2 99% Physical Exam  Nursing note and vitals reviewed. Constitutional: She is oriented to person, place, and time. She appears well-developed and well-nourished. No distress.  This is a morbidly obese African American female who appears to be in no acute distress.  HENT:  Head: Atraumatic.  Eyes: Conjunctivae are normal.  Neck: Neck supple. No JVD present.  Cardiovascular: Normal rate, regular rhythm and intact distal pulses.   Pulmonary/Chest: Effort normal and  breath sounds normal.  Decreased breath sounds throughout with prolonged expiratory phase but no rales, wheezes, rhonchi heard.  Difficult to exam due to large body habitus.  Abdominal: There is tenderness (mild generalized abdominal tenderness without focal point tenderness. Difficult to examine due to large body habitus.).  Musculoskeletal: She exhibits edema (Trace pitting edema to bilateral lower extremities, difficult to examine due to large body habitus ).  Neurological: She is alert and oriented to person, place, and time.  Patient able to ambulate with a cane  Skin: No rash noted.  Psychiatric: She has a normal mood and affect.    ED Course  Procedures (including critical care time)  11:37 PM Patient complaining of progressive shortness of breath and some URI symptoms. She is afebrile with stable normal vital sign and no evidence of hypoxia at this time.  Will ambulate pt to check O2. She may have some evidence of fluid retention however this is difficult to assess due to her large body habitus. The complaining of vague abdominal discomfort but no nausea vomiting or diarrhea. Workup initiated.  12:25 AM The patient got up to ambulate she becomes very symptomatic. She walks for approximately 10-15 feet and her sats has dropped down to 84% on room air. She is now back to her bed, with supplemental oxygen, feeling better.  Pt currently awaits CXR.  Her ProBNP is mildly elevated to 132.4, higher than her baseline.    1:21 AM CXR demonstrates R basilar atelectasis.  I have consulted [facility] resident Dr. Zada Girt, who agrees to see pt in ER and will admit to tele, under Dr. Gwendolyn Grant.    Labs Review Labs Reviewed  CBC - Abnormal; Notable for the following:    WBC 10.8 (*)    All other components within normal limits  BASIC METABOLIC PANEL - Abnormal; Notable for the following:    Glucose, Bld 114 (*)    All other components within normal limits  PRO B NATRIURETIC PEPTIDE - Abnormal; Notable  for the following:    Pro B Natriuretic peptide (BNP) 132.4 (*)    All other components within normal limits  TROPONIN I    Imaging Review Dg Chest 2 View  10/06/2013   CLINICAL DATA:  Shortness of breath.  EXAM: CHEST  2 VIEW  COMPARISON:  12/23/2010.  FINDINGS: The cardiac silhouette, mediastinal and hilar contours are within normal limits and stable. Stable surgical changes. The lungs are clear except for right basilar atelectasis. No pleural effusion or pulmonary edema. The bony thorax is intact.  IMPRESSION: Right basilar atelectasis.   Electronically Signed   By: Loralie Champagne M.D.   On: 10/06/2013 01:17     EKG Interpretation None      Date: 10/06/2013  Rate: 89  Rhythm: normal sinus rhythm  QRS Axis: normal  Intervals: normal  ST/T Wave abnormalities: normal  Conduction Disutrbances:none  Narrative Interpretation:   Old EKG Reviewed: unchanged    MDM   Final diagnoses:  Shortness of breath    BP 136/82  Pulse 73  Temp(Src) 98.9 F (37.2 C) (Oral)  Resp 14  SpO2 100%  I have reviewed nursing notes and vital signs. I personally reviewed the imaging tests through PACS system  I reviewed available ER/hospitalization records thought the EMR     Fayrene Helper, [place] 10/06/13 0122

## 2013-10-05 NOTE — ED Notes (Signed)
Patient arrives with complaint of shortness of breath which she states began about 2 weeks ago. Explains that she was having some swelling in her legs and abdomen, but that has subsided since she started taking her "fluid pill". States that the shortness of breath has remained and has worsened slightly and states that exertion increases it.

## 2013-10-06 ENCOUNTER — Observation Stay (HOSPITAL_COMMUNITY): Payer: Medicaid Other

## 2013-10-06 ENCOUNTER — Emergency Department (HOSPITAL_COMMUNITY): Payer: Medicaid Other

## 2013-10-06 ENCOUNTER — Encounter (HOSPITAL_COMMUNITY): Payer: Self-pay

## 2013-10-06 DIAGNOSIS — J8409 Other alveolar and parieto-alveolar conditions: Secondary | ICD-10-CM | POA: Diagnosis not present

## 2013-10-06 DIAGNOSIS — E662 Morbid (severe) obesity with alveolar hypoventilation: Secondary | ICD-10-CM | POA: Diagnosis present

## 2013-10-06 DIAGNOSIS — E876 Hypokalemia: Secondary | ICD-10-CM | POA: Diagnosis present

## 2013-10-06 DIAGNOSIS — Z91199 Patient's noncompliance with other medical treatment and regimen due to unspecified reason: Secondary | ICD-10-CM | POA: Diagnosis not present

## 2013-10-06 DIAGNOSIS — R0609 Other forms of dyspnea: Secondary | ICD-10-CM | POA: Diagnosis present

## 2013-10-06 DIAGNOSIS — F3289 Other specified depressive episodes: Secondary | ICD-10-CM | POA: Diagnosis present

## 2013-10-06 DIAGNOSIS — J45909 Unspecified asthma, uncomplicated: Secondary | ICD-10-CM | POA: Diagnosis present

## 2013-10-06 DIAGNOSIS — I10 Essential (primary) hypertension: Secondary | ICD-10-CM | POA: Diagnosis present

## 2013-10-06 DIAGNOSIS — M1710 Unilateral primary osteoarthritis, unspecified knee: Secondary | ICD-10-CM | POA: Diagnosis present

## 2013-10-06 DIAGNOSIS — D869 Sarcoidosis, unspecified: Secondary | ICD-10-CM | POA: Diagnosis present

## 2013-10-06 DIAGNOSIS — J8410 Pulmonary fibrosis, unspecified: Secondary | ICD-10-CM | POA: Diagnosis present

## 2013-10-06 DIAGNOSIS — Z79899 Other long term (current) drug therapy: Secondary | ICD-10-CM | POA: Diagnosis not present

## 2013-10-06 DIAGNOSIS — E8809 Other disorders of plasma-protein metabolism, not elsewhere classified: Secondary | ICD-10-CM | POA: Diagnosis present

## 2013-10-06 DIAGNOSIS — Z23 Encounter for immunization: Secondary | ICD-10-CM | POA: Diagnosis not present

## 2013-10-06 DIAGNOSIS — K219 Gastro-esophageal reflux disease without esophagitis: Secondary | ICD-10-CM | POA: Diagnosis present

## 2013-10-06 DIAGNOSIS — M069 Rheumatoid arthritis, unspecified: Secondary | ICD-10-CM | POA: Diagnosis present

## 2013-10-06 DIAGNOSIS — R0602 Shortness of breath: Secondary | ICD-10-CM | POA: Diagnosis present

## 2013-10-06 DIAGNOSIS — IMO0002 Reserved for concepts with insufficient information to code with codable children: Secondary | ICD-10-CM | POA: Diagnosis not present

## 2013-10-06 DIAGNOSIS — F411 Generalized anxiety disorder: Secondary | ICD-10-CM | POA: Diagnosis present

## 2013-10-06 DIAGNOSIS — G7000 Myasthenia gravis without (acute) exacerbation: Secondary | ICD-10-CM | POA: Diagnosis present

## 2013-10-06 DIAGNOSIS — E119 Type 2 diabetes mellitus without complications: Secondary | ICD-10-CM | POA: Diagnosis present

## 2013-10-06 DIAGNOSIS — F329 Major depressive disorder, single episode, unspecified: Secondary | ICD-10-CM | POA: Diagnosis present

## 2013-10-06 DIAGNOSIS — Z6841 Body Mass Index (BMI) 40.0 and over, adult: Secondary | ICD-10-CM | POA: Diagnosis not present

## 2013-10-06 DIAGNOSIS — E785 Hyperlipidemia, unspecified: Secondary | ICD-10-CM | POA: Diagnosis present

## 2013-10-06 DIAGNOSIS — D72829 Elevated white blood cell count, unspecified: Secondary | ICD-10-CM | POA: Diagnosis present

## 2013-10-06 DIAGNOSIS — G4733 Obstructive sleep apnea (adult) (pediatric): Secondary | ICD-10-CM | POA: Diagnosis present

## 2013-10-06 LAB — COMPREHENSIVE METABOLIC PANEL
ALT: 15 U/L (ref 0–35)
ANION GAP: 17 — AB (ref 5–15)
AST: 14 U/L (ref 0–37)
Albumin: 3.1 g/dL — ABNORMAL LOW (ref 3.5–5.2)
Alkaline Phosphatase: 94 U/L (ref 39–117)
BUN: 12 mg/dL (ref 6–23)
CHLORIDE: 103 meq/L (ref 96–112)
CO2: 22 mEq/L (ref 19–32)
Calcium: 9 mg/dL (ref 8.4–10.5)
Creatinine, Ser: 0.66 mg/dL (ref 0.50–1.10)
GFR calc Af Amer: >90 mL/min (ref >90)
GFR calc non Af Amer: >90 mL/min (ref >90)
Glucose, Bld: 143 mg/dL — ABNORMAL HIGH (ref 70–99)
Potassium: 3.6 mEq/L — ABNORMAL LOW (ref 3.7–5.3)
Sodium: 142 mEq/L (ref 137–147)
TOTAL PROTEIN: 7.1 g/dL (ref 6.0–8.3)
Total Bilirubin: 0.2 mg/dL — ABNORMAL LOW (ref 0.3–1.2)

## 2013-10-06 LAB — URINALYSIS, ROUTINE W REFLEX MICROSCOPIC
Bilirubin Urine: NEGATIVE
GLUCOSE, UA: NEGATIVE mg/dL
Hgb urine dipstick: NEGATIVE
Ketones, ur: NEGATIVE mg/dL
LEUKOCYTES UA: NEGATIVE
NITRITE: NEGATIVE
PH: 7 (ref 5.0–8.0)
PROTEIN: NEGATIVE mg/dL
Specific Gravity, Urine: 1.022 (ref 1.005–1.030)
Urobilinogen, UA: 0.2 mg/dL (ref 0.0–1.0)

## 2013-10-06 LAB — DIFFERENTIAL
BASOS ABS: 0 10*3/uL (ref 0.0–0.1)
Basophils Relative: 0 % (ref 0–1)
Eosinophils Absolute: 0.3 10*3/uL (ref 0.0–0.7)
Eosinophils Relative: 2 % (ref 0–5)
LYMPHS ABS: 2 10*3/uL (ref 0.7–4.0)
Lymphocytes Relative: 15 % (ref 12–46)
Monocytes Absolute: 0.8 10*3/uL (ref 0.1–1.0)
Monocytes Relative: 6 % (ref 3–12)
NEUTROS PCT: 77 % (ref 43–77)
Neutro Abs: 10.1 10*3/uL — ABNORMAL HIGH (ref 1.7–7.7)

## 2013-10-06 LAB — CBC
HCT: 40.6 % (ref 36.0–46.0)
Hemoglobin: 13.1 g/dL (ref 12.0–15.0)
MCH: 28.5 pg (ref 26.0–34.0)
MCHC: 32.3 g/dL (ref 30.0–36.0)
MCV: 88.5 fL (ref 78.0–100.0)
PLATELETS: 289 10*3/uL (ref 150–400)
RBC: 4.59 MIL/uL (ref 3.87–5.11)
RDW: 14.9 % (ref 11.5–15.5)
WBC: 13.4 10*3/uL — ABNORMAL HIGH (ref 4.0–10.5)

## 2013-10-06 LAB — MAGNESIUM: MAGNESIUM: 2 mg/dL (ref 1.5–2.5)

## 2013-10-06 LAB — D-DIMER, QUANTITATIVE (NOT AT ARMC): D DIMER QUANT: 1.62 ug{FEU}/mL — AB (ref 0.00–0.48)

## 2013-10-06 LAB — TROPONIN I: Troponin I: <0.3 ng/mL (ref <0.30)

## 2013-10-06 MED ORDER — LISINOPRIL 20 MG PO TABS
20.0000 mg | ORAL_TABLET | Freq: Every day | ORAL | Status: DC
  Administered 2013-10-06 – 2013-10-08 (×3): 20 mg via ORAL
  Filled 2013-10-06 (×3): qty 1

## 2013-10-06 MED ORDER — FLUOXETINE HCL 20 MG PO CAPS
40.0000 mg | ORAL_CAPSULE | Freq: Every day | ORAL | Status: DC
  Administered 2013-10-06 – 2013-10-08 (×3): 40 mg via ORAL
  Filled 2013-10-06 (×3): qty 2

## 2013-10-06 MED ORDER — BUDESONIDE-FORMOTEROL FUMARATE 160-4.5 MCG/ACT IN AERO: 2.0000 | INHALATION_SPRAY | Freq: Two times a day (BID) | RESPIRATORY_TRACT | Status: DC | PRN

## 2013-10-06 MED ORDER — SODIUM CHLORIDE 0.9 % IJ SOLN: 3.0000 mL | INTRAMUSCULAR | Status: DC | PRN

## 2013-10-06 MED ORDER — LISINOPRIL-HYDROCHLOROTHIAZIDE 20-12.5 MG PO TABS: 1.0000 | ORAL_TABLET | Freq: Every day | ORAL | Status: DC

## 2013-10-06 MED ORDER — ALUM & MAG HYDROXIDE-SIMETH 200-200-20 MG/5ML PO SUSP: 30.0000 mL | Freq: Four times a day (QID) | ORAL | Status: DC | PRN

## 2013-10-06 MED ORDER — BUDESONIDE-FORMOTEROL FUMARATE 160-4.5 MCG/ACT IN AERO
2.0000 | INHALATION_SPRAY | Freq: Two times a day (BID) | RESPIRATORY_TRACT | Status: DC
  Administered 2013-10-07 – 2013-10-08 (×3): 2 via RESPIRATORY_TRACT
  Filled 2013-10-06: qty 6

## 2013-10-06 MED ORDER — ONDANSETRON HCL 4 MG/2ML IJ SOLN: 4.0000 mg | Freq: Four times a day (QID) | INTRAMUSCULAR | Status: DC | PRN

## 2013-10-06 MED ORDER — MUSCLE RUB 10-15 % EX CREA
TOPICAL_CREAM | Freq: Two times a day (BID) | CUTANEOUS | Status: DC
  Administered 2013-10-06: 21:00:00 via TOPICAL
  Administered 2013-10-06 – 2013-10-07 (×2): 1 via TOPICAL
  Administered 2013-10-07 – 2013-10-08 (×2): via TOPICAL
  Filled 2013-10-06: qty 85

## 2013-10-06 MED ORDER — CLONIDINE HCL 0.3 MG/24HR TD PTWK: 0.3000 mg | MEDICATED_PATCH | TRANSDERMAL | Status: DC

## 2013-10-06 MED ORDER — IBUPROFEN 600 MG PO TABS
600.0000 mg | ORAL_TABLET | Freq: Four times a day (QID) | ORAL | Status: DC | PRN
  Administered 2013-10-06 – 2013-10-07 (×2): 600 mg via ORAL
  Filled 2013-10-06 (×2): qty 1

## 2013-10-06 MED ORDER — ONDANSETRON HCL 4 MG PO TABS: 4.0000 mg | ORAL_TABLET | Freq: Four times a day (QID) | ORAL | Status: DC | PRN

## 2013-10-06 MED ORDER — PREDNISONE 10 MG PO TABS
10.0000 mg | ORAL_TABLET | Freq: Every day | ORAL | Status: DC
  Administered 2013-10-06 – 2013-10-08 (×3): 10 mg via ORAL
  Filled 2013-10-06 (×4): qty 1

## 2013-10-06 MED ORDER — ALBUTEROL SULFATE (2.5 MG/3ML) 0.083% IN NEBU
5.0000 mg | INHALATION_SOLUTION | Freq: Once | RESPIRATORY_TRACT | Status: AC
  Administered 2013-10-06: 5 mg via RESPIRATORY_TRACT
  Filled 2013-10-06: qty 6

## 2013-10-06 MED ORDER — SODIUM CHLORIDE 0.9 % IV SOLN: 250.0000 mL | INTRAVENOUS | Status: DC | PRN

## 2013-10-06 MED ORDER — LORATADINE 10 MG PO TABS
10.0000 mg | ORAL_TABLET | Freq: Every day | ORAL | Status: DC
  Administered 2013-10-06 – 2013-10-08 (×3): 10 mg via ORAL
  Filled 2013-10-06 (×3): qty 1

## 2013-10-06 MED ORDER — IOHEXOL 350 MG/ML SOLN
100.0000 mL | Freq: Once | INTRAVENOUS | Status: AC | PRN
Start: 2013-10-06 — End: 2013-10-06
  Administered 2013-10-06: 100 mL via INTRAVENOUS

## 2013-10-06 MED ORDER — POLYETHYLENE GLYCOL 3350 17 G PO PACK
17.0000 g | PACK | Freq: Every day | ORAL | Status: DC | PRN
  Filled 2013-10-06: qty 1

## 2013-10-06 MED ORDER — MONTELUKAST SODIUM 10 MG PO TABS
10.0000 mg | ORAL_TABLET | Freq: Every day | ORAL | Status: DC
  Administered 2013-10-06 – 2013-10-07 (×2): 10 mg via ORAL
  Filled 2013-10-06 (×3): qty 1

## 2013-10-06 MED ORDER — FUROSEMIDE 10 MG/ML IJ SOLN
60.0000 mg | Freq: Two times a day (BID) | INTRAMUSCULAR | Status: DC
  Administered 2013-10-06 – 2013-10-07 (×3): 60 mg via INTRAVENOUS
  Filled 2013-10-06 (×4): qty 6

## 2013-10-06 MED ORDER — POTASSIUM CHLORIDE CRYS ER 20 MEQ PO TBCR
40.0000 meq | EXTENDED_RELEASE_TABLET | Freq: Once | ORAL | Status: AC
  Administered 2013-10-06: 40 meq via ORAL
  Filled 2013-10-06: qty 2

## 2013-10-06 MED ORDER — SODIUM CHLORIDE 0.9 % IJ SOLN
3.0000 mL | Freq: Two times a day (BID) | INTRAMUSCULAR | Status: DC
  Administered 2013-10-06 – 2013-10-08 (×6): 3 mL via INTRAVENOUS

## 2013-10-06 MED ORDER — IPRATROPIUM BROMIDE 0.02 % IN SOLN
0.5000 mg | Freq: Once | RESPIRATORY_TRACT | Status: AC
  Administered 2013-10-06: 0.5 mg via RESPIRATORY_TRACT
  Filled 2013-10-06: qty 2.5

## 2013-10-06 MED ORDER — HEPARIN SODIUM (PORCINE) 5000 UNIT/ML IJ SOLN
5000.0000 [IU] | Freq: Three times a day (TID) | INTRAMUSCULAR | Status: DC
  Administered 2013-10-06 – 2013-10-08 (×7): 5000 [IU] via SUBCUTANEOUS
  Filled 2013-10-06 (×12): qty 1

## 2013-10-06 MED ORDER — HYDROCODONE-ACETAMINOPHEN 10-325 MG PO TABS
1.0000 | ORAL_TABLET | Freq: Three times a day (TID) | ORAL | Status: DC | PRN
  Administered 2013-10-06 – 2013-10-07 (×2): 1 via ORAL
  Filled 2013-10-06 (×2): qty 1

## 2013-10-06 MED ORDER — CAPSAICIN 0.025 % EX CREA
TOPICAL_CREAM | Freq: Two times a day (BID) | CUTANEOUS | Status: DC
  Filled 2013-10-06: qty 56.6

## 2013-10-06 MED ORDER — PANTOPRAZOLE SODIUM 40 MG PO TBEC
80.0000 mg | DELAYED_RELEASE_TABLET | Freq: Every day | ORAL | Status: DC
  Administered 2013-10-06 – 2013-10-08 (×3): 80 mg via ORAL
  Filled 2013-10-06 (×3): qty 2

## 2013-10-06 MED ORDER — HYDROCHLOROTHIAZIDE 12.5 MG PO CAPS
12.5000 mg | ORAL_CAPSULE | Freq: Every day | ORAL | Status: DC
Start: 2013-10-06 — End: 2013-10-08
  Administered 2013-10-06 – 2013-10-08 (×3): 12.5 mg via ORAL
  Filled 2013-10-06 (×3): qty 1

## 2013-10-06 MED ORDER — ALBUTEROL SULFATE (2.5 MG/3ML) 0.083% IN NEBU: 2.5000 mg | INHALATION_SOLUTION | RESPIRATORY_TRACT | Status: DC | PRN

## 2013-10-06 MED ORDER — MELOXICAM 15 MG PO TABS
15.0000 mg | ORAL_TABLET | Freq: Every day | ORAL | Status: DC
  Filled 2013-10-06: qty 1

## 2013-10-06 MED ORDER — INFLUENZA VAC SPLIT QUAD 0.5 ML IM SUSY
0.5000 mL | PREFILLED_SYRINGE | INTRAMUSCULAR | Status: AC
  Administered 2013-10-07: 0.5 mL via INTRAMUSCULAR
  Filled 2013-10-06: qty 0.5

## 2013-10-06 MED ORDER — OXYBUTYNIN CHLORIDE 5 MG PO TABS
5.0000 mg | ORAL_TABLET | Freq: Two times a day (BID) | ORAL | Status: DC
  Administered 2013-10-06 – 2013-10-08 (×6): 5 mg via ORAL
  Filled 2013-10-06 (×7): qty 1

## 2013-10-06 NOTE — Progress Notes (Signed)
SATURATION QUALIFICATIONS: (This note is used to comply with regulatory documentation for home oxygen) ° °Patient Saturations on Room Air at Rest = 99% ° °Patient Saturations on Room Air while Ambulating = 96% ° °

## 2013-10-06 NOTE — H&P (Signed)
I agree with assessment and plan on patient Please see my note from later today.  Pleas Koch, MD [facility] [phone]

## 2013-10-06 NOTE — Progress Notes (Signed)
Attending note   I agree with the History/assesment & plan per Resident MD as scribed elsewhere within the note and have independently examined as well as discussed the Plan of care with the patient, and ammendments were made to above note reflecting my thoughts after careful review of Database, Progress notes, Imaging and Consultant notes     Admit SOB, dizzy hot/cold sometimes as well as LE swelling-she always has a post-nasal drainage.  Mld hemoptysis as well as mild blood in nasal d/c-takes Zystec at home Started taking fluid pill prn and drained a lot of fluid off.  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 Mallampati 3 NO pallor, mild proptosis of eyes, NO JVD, no bruit Midline chets scar Grade 2-3 LE edema, pitting  Multifactorial dyspnea-Favour AECHF>Primary pulmonary issue-has also restrictive lung component/Likely OSA playing a role as well. -on multiple NSAIDS for arthritis pain and has h/o htn-likjely precipitatn NSAIDS and poor htn control -evidence of this occurrence is her relatively elevated BNp on admit -doubt Pneumonia-leukocytosis 2/2 to Steroids which are chronic -Currently ambulant to RR without issues. -await echocardiogram -she has been diagnosed in the past with "asthma" need to clarify if this was as a young person or around the time of BOOP diagnosis  HTn -needs education about her meds as she  thought she was on only lisinopril but was on diazide -theoretic risk aki if on too high a dose of diuretic  Imparied glucose tolerance 2/2 to steroid -see resident note  Morbid obesity, Body mass index is 67.6 kg/(m^2). -life threatening habitus -get Bariatric nurse to see this admission for consideration of GOP surgery -should be covered given 2 co-morbidities  Myasthenia Gravis s/p Thymectomy -unclear why not on Mestinon or other medicine?    Feels a "little Hot" while on oxygen but it is better Mild HA currently-pain pill for the knees.  Takes prednisone for "Lung" issues, not Myasthenia.  OOB with oxygen and desat screen -1.5 now Restart CPAP-trial tonight    [name], [facility]

## 2013-10-06 NOTE — ED Notes (Signed)
Walked patient from room 1 down to room 4.  Patient started at 96 % on room air and quickly de-sated down to 84% on room air.  Patient got very short of breath and I returned her to her bed and placed her on 2 liters of oxygen.

## 2013-10-06 NOTE — Progress Notes (Signed)
Nutrition Consult/Brief Note  RD consulted for diet education.  RD provided "Weight Loss Tips" and "Weight Management Cooking Tips" handouts from the Academy of [employer] and Dietetics.  Patient opted to review education material on her own time.   Wt Readings from Last 15 Encounters:  10/06/13 394 lb (178.717 kg)  08/03/13 375 lb (170.099 kg)  07/09/13 371 lb (168.284 kg)  05/25/13 372 lb 8 oz (168.965 kg)  04/25/13 367 lb 6.4 oz (166.652 kg)  04/02/13 361 lb (163.749 kg)  03/02/13 361 lb (163.749 kg)  02/08/13 361 lb (163.749 kg)  12/26/12 358 lb (162.388 kg)  12/25/12 359 lb (162.841 kg)  09/14/12 381 lb (172.82 kg)  08/14/12 399 lb (180.985 kg)  08/01/12 404 lb (183.253 kg)  07/12/12 365 lb (165.563 kg)  04/10/12 397 lb (180.078 kg)    Body mass index is 67.6 kg/(m^2). Patient meets criteria for Obesity Class III based on current BMI.   Current diet order is Renal W/1200 ml with fluid restriction. Labs and medications reviewed.   No nutrition interventions warranted at this time. If nutrition issues arise, please consult RD.   Maureen Chatters, RD, LDN Pager #: [phone] After-Hours Pager #: [phone]

## 2013-10-06 NOTE — Progress Notes (Signed)
UR completed 

## 2013-10-06 NOTE — ED Provider Notes (Signed)
Patient presents for complaints of SOB.  Progressive over the past 2 months with weight gain and BLE edema.  History is consistent with CHF.  Walking O2 sat is 84%.  Will retain in tele unit for diuresis and further evaluation of her SOB.  Medical screening examination/treatment/procedure(s) were conducted as a shared visit with non-physician practitioner(s) and myself.  I personally evaluated the patient during the encounter.   EKG Interpretation None        Tomasita Crumble, MD 10/06/13 1712

## 2013-10-06 NOTE — Progress Notes (Signed)
Patient alert and oriented x4 throughout shift, vital signs stable.  Ambulated 50ft in room and hallway with RN, HR up to 140s sinus tach, patient experiencing shortness of breath and headache.  When back in bed, patient reported that headache was starting to make her dizzy and nauseous.  Repositioned patient, which she reported helped the headache fall to dull ache from throb.  MD notified, PRN administered for pain.  Patient denies any additional questions or concerns at this time.  Will continue to monitor.

## 2013-10-06 NOTE — Progress Notes (Signed)
See other note dictated

## 2013-10-06 NOTE — Progress Notes (Signed)
Family Medicine Teaching Service Daily Progress Note Intern Pager: [phone]  Patient name: [name] Medical record number: [id number] Date of birth: 10/21/1965 Age: 48 y.o. Gender: female  Primary Care Provider: [name], MD Consultants: None Code Status: Full   Pt Overview and Major Events to Date:  9/12: Admitted for worsening dyspnea, edema, and weight gain   Assessment and Plan:  [name] is a 48 y.o. female presenting with increased shortness of breath with exertion, weight gain, and whole body swelling for about 2 weeks. PMH is significant for myasthenia gravis, asthma, obesity hypoventilation syndrome/obstructive sleep apnea, osteoarthritis on chronic opiates, hypertension, and GERD.  Dyspnea and Volume Overload - Improved dyspnea after IV lasix therapy. Etiology most likely multifactorial with probable undiagnosed chronic CHF in setting of asthma, BOOP, OSA, and probable OHS. Pt with no renal insufficieny, however due to anasarca, will rule out nephrotic syndrome.  -Oxygen therapy to keep SpO2 >92% and monitor with ambulation  -Continue IV lasix 60 mg BID with close monitoring of renal function -Awaiting 2D-echo to assess for cardiac function in setting of elevated pro-BNP -Obtain BMP, TSH, and UA to assess for proteinuria   -Continue to monitor on telemetry  -Continue to monitor daily weight and input/output  Asthma - Improved dyspnea with no current exacerbation  -Continue albuterol nebulizer Q 2 hr PRN and symbicort 2 puffs  BID   -Continue home prednisone 10 mg daily for history of BOOP -Consider outpatient pulmonary function testing   Hypokalemia - K 3.6 this AM with normal Mg (2.0) level. Etiology most likely due to IV diuresis  -Replace with kdur 40 mEq x 1 dose -Repeat BMP and Mg level in AM  Hypertension - Currently normotensive.  Pt at home on lisinopril-HCTZ 20-12.5 daily, clonidine weekly patch, and furosemide 20 mg PRN fluid retention   -Continue HCTZ 12.5 mg daily, lisinopril 20 mg daily, and clonidine weekly patch (last placed on 9/11)  Elevated Fasting Glucose - Glucose 143 this AM.  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 -Continue home prednisone 10 mg daily  -Pt needs follow-up appointment with pulmonologist Dr. Craige Cotta   Right knee pain in setting of OA - Currently in pain without inflammation.   -Continue norco 10-325 mg Q 8 hr PRN pain -Topical muscle rub cream BID    Depression - Pt currently with stable mood. -Continue fluoxetine 40 mg daily   GERD - Currently with no acid reflux symptoms. Pt at home on omeprazole 40 mg BID.  -Continue protonix 80 mg daily   Diet: Renal with fluid restriction  PPx: SQ heparin TID  Disposition: 2-3 days  Subjective:   Pt seen and examined in AM. She reports feeling much better in terms of her breathing and whole body swelling. She denies fever, chills, wheezing, chest pain/tightness, or productive cough. She denies history of CHF or recent dietary indiscretion. She has history of allergic rhinitis and has PND with occasional epistaxis and blood speckled sputum. She reports right knee pain that is chronic in nature with no overlying warmth, erythema, or swelling.     Objective: Temp:  [98.5 F (36.9 C)-98.9 F (37.2 C)] 98.5 F (36.9 C) (09/12  0701) Pulse Rate:  [73-100] 93 (09/12 1117) Resp:  [14-22] 18 (09/12 1117) BP: (114-156)/(60-98) 123/76 mmHg (09/12 1117) SpO2:  [99 %-100 %] 100 % (09/12 1117) Weight:  [394 lb (178.717 kg)] 394 lb (178.717 kg) (09/12 0250)  Physical Exam: General: Morbidly obese Cardiovascular: Normal rate and rhythm Respiratory: Clear to auscultation bilaterally  with no wheezing, ronchi, or rales. Good air movement Abdomen: Soft, non-tender, non-distended with no guarding, rebound, or rigidity  Extremities: UE and LE +2 nonpitting edema (hard to distinguish from subcutaneous fat). Right knee tenderness without overlying erythema or warmth.   Laboratory:  Recent Labs Lab 10/05/13 2118 10/06/13 0542  WBC 10.8* 13.4*  HGB 12.7 13.1  HCT 38.7 40.6  PLT 259 289    Recent Labs Lab 10/05/13 2118 10/06/13 0542  NA 142 142  K 4.0 3.6*  CL 103 103  CO2 27 22  BUN 14 12  CREATININE 0.64 0.66  CALCIUM 9.1 9.0  PROT  --  7.1  BILITOT  --  0.2*  ALKPHOS  --  94  ALT  --  15  AST  --  14  GLUCOSE 114* 143*       Henry Utsey, MD 10/06/2013, 1:55 PM PGY-2, IMTS

## 2013-10-06 NOTE — H&P (Signed)
Family Medicine Teaching [facility] Admission History and Physical Service Pager: [phone]  Patient name: [name] Medical record number: [id number] Date of birth: 17-Sep-1965 Age: 48 y.o. Gender: female  Primary Care Provider: [name], MD Consultants: None Code Status: Full  Chief Complaint: Dyspnea on exertion   Assessment and Plan:  Principal Problem:   DOE (dyspnea on exertion) Active Problems:   Morbid obesity   OBESITY HYPOVENTILATION SYNDROME   OBSTRUCTIVE SLEEP APNEA   HYPERTENSION, BENIGN SYSTEMIC   Shortness of breath  [name] is a 48 y.o. female presenting with increased shortness of breath with exertion for about 2 weeks. PMH is significant for myasthenia gravis, asthma, obesity hypoventilation syndrome/obstructive sleep apnea, osteoarthritis on chronic opiates, hypertension, and GERD.  Dyspnea on exertion: Differential diagnoses include undiagnosed CHF, OSA, and OHS. She states that she has noticed general body swelling which has not responded to home Lasix for 2 days. ProBNP of 132. It is difficult to accurately assess her volume status due to body habitus. Her chart review reveals weight gain of about 20 pounds in the last 2 months (375 to 395 pounds). EKG without ischemic changes. Troponin is negative. Well score for PE, is 0. However, given her desaturation on ambulation, will need to obtain a d-dimer to confidently exclude PE. Chest x-ray revealed right basilar atelectasis which is not unexpected in obesity. 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 Pneumonia is less likely given that she has no fever or cough and no infiltrates on cxr, even though she has a leukocytosis of 10.4. An asthmatic attack is less likely in the setting of absence of symptoms and without wheezing on lung auscultation. In the ED, she received ipratropium and albuterol nebulizing treatment  without much improvement. Other considerations include pulmonary hypertension, which an echocardiogram might help to demonstrate. Plan. - Admit telemetry under observation. - Will obtain an echocardiogram for evaluation of systolic and diastolic function. -Trial of diuresis with IV Lasix 60 mg twice a day - Strict ins and outs. - Daily weights. Estimated dry weight is 371lb. - Monitor electrolytes closely. - Stat d-dimer >> obtain a chest CTA if positive - Supplementary oxygen - Troponin - Repeat EKG in the morning  Hypertension: Blood pressure currently stable. Continue with home medications, including clonidine patch 0.3 mg every 72 hours, hydrochlorothiazide 12.5 mg daily, and lisinopril 20 mg daily.  Asthma: No evidence of acute exacerbation. Continue with home Symbicort. Continue with albuterol nebulizing treatment every 2 hours as needed. For shortness of breath, or wheezing.  Osteoarthritis: Patient has been extensively evaluated by both her PCP and that sports medicine Center for chronic pain due to osteoarthritis involving the knees. Will continue with her home pain regimen including meloxicam 15 mg daily, Vicodin 10-325 mg Q8 hours when necessary.  Morbidly obese: Complicated with obesity hypoventilation syndrome, and obstructive sleep apnea. Does not use CPAP at home.    FEN/GI: Normal saline lock. A renal diet with fluid restriction. Zofran for nausea. Maalox dyspepsia. Prophylaxis: Lovenox  Disposition: Patient will be admitted under observation in order to obtain an echocardiogram and attempt to diurese her with IV Lasix. She can quickly be changed to an increased PO lasix dose at discharge.   History of Present Illness: [name] is a 48 y.o. female presenting with myasthenia gravis, stable asthma, obesity related hypoventilation syndrome/obstructive sleep apnea, osteoarthritis on chronic opiates, hypertension, and GERD, presenting with  increased shortness of breath  over the past 2 weeks. She also reports increased, general body swelling, and she has tried using 2 doses of 20 mg of Lasix without much improvement. She denies wheezing, cough, fevers, chills, or rhinorrhea. She denies chest pain. She uses 3 pillows at home, and this has not changed over the past 2 weeks with increasing dyspnea on exertion. She does not report PND. No chest pain. In the emergency department, she was found to desaturate on ambulatory oxygen, up to 84% on room air. Family medicine was subsequently requested to admit the patient for further evaluation.  Review Of Systems:  Otherwise 12 point review of systems was performed and was unremarkable except for some nausea or with epigastric pain, which he normally improves with omeprazole. She also reports ongoing, bilateral knee pains due to osteoarthritis.  Patient Active Problem List   Diagnosis Date Noted  . Shortness of breath 10/06/2013  . Cough 05/26/2013  . Rash 12/27/2012  . Cystocele 09/14/2012  . Unspecified contraceptive management 08/15/2012  . Headache 11/02/2011  . Sarcoid 12/23/2010  . Knee pain, bilateral 05/28/2010  . 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  . GLUCOSE INTOLERANCE 10/07/2008  . VOCAL CORD DISORDER 09/26/2008  . ALLERGIC RHINITIS 05/30/2008  . 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 Unspecified asthma 12/01/2007  . 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 OBSTRUCTIVE SLEEP APNEA 10/30/2007  . Morbid obesity 04/10/2007  . MYASTHENIA 03/07/2007  . SYMPTOM, INCONTINENCE, MIXED, URGE/STRESS 05/30/2006  . DEPRESSIVE DISORDER, NOS 03/24/2006  . HYPERTENSION, BENIGN SYSTEMIC 03/24/2006  . GASTROESOPHAGEAL REFLUX, NO ESOPHAGITIS 03/24/2006   Past Medical History: Past Medical History  Diagnosis Date  . Myasthenia gravis 1994    Positive Acetylcholine receptor Ab and single fiber EMG Dr. Clarisa [facility]red [facility]- Dr. Noreene Filbert 1998, Dr Sharene Skeans 2008 [place]  [facility]urologic- Failed prednisone due to weight gain, stopped imuran/mestinon due to finances- Now with quiescent disease per Dr Sharene Skeans and no flares in many years  . 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 Coralyn Helling- 10/30/2007 ANA positive, ANA titer  negative, RF less than 20  . ALLERGIC RHINITIS     takes Zyrtec daily  . OSA (obstructive sleep apnea)     RDI 10 on PSG 10/09  . Hypoventilation associated with obesity syndrome   . Pelvic inflammatory disease (PID)   . Viral meningitis     history of viral meningitis  . Irritable bladder   . Hypertension     takes Prinizide daily and Clonidine on Mondays  . Asthma     Albuterol prn;Symbicort daily and Singulair daily  . Shortness of breath     can be sitting as well as exertion  . Pneumonia     hx of;last time about 1-9yrs ago  . Bronchitis     hx of  . Headache(784.0)     couple of times a week  . Dizziness     pt states she gets off balance occasionally  . Arthritis   . Joint pain   . Joint swelling   . Myasthenia gravis   . Chronic back pain   . Osteoarthritis   . Rheumatoid arthritis(714.0)   . Carpal tunnel syndrome     right  . Eczema   . Trigger finger   . GERD (gastroesophageal reflux disease)     takes Omeprazole daily  . Peptic ulcer   . Constipation     Miralax prn  .  Hemorrhoids   . Urinary frequency     takes Ditropan daily  . Urinary urgency   . Nocturia   . Anxiety   . Depression     takes Cymbalta daily  . Insomnia   . Lung disease     scleraderma  . Skin irritation     skin itchy   Past Surgical History: Past Surgical History  Procedure Laterality Date  . Cesarean section  09/15/2004  . Bronchoscopy  08/2007  . Thymus resection  08/25/1993  . Laparoscopic cholecystectomy  07/2008    by Dr. Cyndia Bent  . Cortisone injection      receives an injection every 28months  . Esophagogastroduodenoscopy    . Trigger finger release  05/20/2011    Procedure: RELEASE  TRIGGER FINGER/A-1 PULLEY;  Surgeon: [name], MD;  Location: [facility] OR;  Service: Orthopedics;  Laterality: Right;  RIGHT TRIGGER THUMB RELEASE AND RIGHT CARPAL TUNNEL RELEASE  . Carpal tunnel release  05/20/2011    Procedure: CARPAL TUNNEL RELEASE;  Surgeon: [name], MD;  Location: [facility] OR;  Service: Orthopedics;  Laterality: Right;   Social History: History  Substance Use Topics  . Smoking status: Never Smoker   . Smokeless tobacco: Never Used  . Alcohol Use: No   Please also refer to relevant sections of EMR.  Family History: Family History  Problem Relation Age of Onset  . Hypertension Father   . Sickle cell trait Father   . Stroke Paternal Grandmother   . Sickle cell trait Paternal Grandfather   . Lupus    . Anesthesia problems Neg Hx   . Hypotension Neg Hx   . Malignant hyperthermia Neg Hx   . Pseudochol deficiency Neg Hx    Allergies and Medications: Allergies  Allergen Reactions  . Apple Other (See Comments)    "mouth itch" - lumps on tongue  . Banana Other (See Comments)    "whelps on tongue"  . Shrimp [Shellfish Allergy] Nausea And Vomiting and Swelling   No current facility-administered medications on file prior to encounter.   Current Outpatient Prescriptions on File Prior to Encounter  Medication Sig Dispense Refill  . cetirizine (ZYRTEC) 10 MG tablet Take 1 tablet (10 mg total) by mouth daily.  30 tablet  11  . dexamethasone (DECADRON) 4 MG tablet Take 2 mg by mouth daily as needed (arthritic flares).      Marland Kitchen etodolac (LODINE) 400 MG tablet Take 400 mg by mouth 2 (two) times daily.      Marland Kitchen FLUoxetine (PROZAC) 40 MG capsule Take 1 capsule (40 mg total) by mouth daily.  30 capsule  11  . lisinopril-hydrochlorothiazide (PRINZIDE,ZESTORETIC) 20-12.5 MG per tablet Take 1 tablet by mouth daily.  30 tablet  11  . meloxicam (MOBIC) 15 MG tablet Take 1 tablet (15 mg total) by mouth daily.  30 tablet  11  . Elastic Bandages & Supports (SLIP-ON  KNEE BRACE) MISC Please evaluate/measure and dispense appropriate braces for severe DJD. Call [phone] with any questions.  2 each  0  . medroxyPROGESTERone (DEPO-PROVERA) 150 MG/ML injection Inject 150 mg into the muscle every 3 (three) months.         Objective: BP 136/82  Pulse 73  Temp(Src) 98.9 F (37.2 C) (Oral)  Resp 14  SpO2 100% Exam: General: Alert, and oriented x3. Mild respiratory distress. Able to speak in complete sentences. Cardiovascular: Normal heart rate. Regular rhythm. No murmurs. JVD cannot be assessed due to the severe obesity Respiratory:  Mild respiratory distress. I do not appreciate any wheezing. Patient has good air movement bilaterally. On auscultation. Abdomen: Morbidly obese. Mild epigastric tenderness. No organomegaly. Normal bowel sounds. Extremities: Good pulses bilaterally. No edema appreciable. Skin: Normal Neuro: No focal neurological deficits. Alert and oriented x3.  Labs and Imaging: CBC BMET   Recent Labs Lab 10/05/13 2118  WBC 10.8*  HGB 12.7  HCT 38.7  PLT 259    Recent Labs Lab 10/05/13 2118  NA 142  K 4.0  CL 103  CO2 27  BUN 14  CREATININE 0.64  GLUCOSE 114*  CALCIUM 9.1     EKG reviewed: Normal sinus rhythm. Poor R-wave progression, otherwise, without acute ST segment or T-wave changes.   Dow Adolph, MD 10/06/2013, 1:32 AM PGY-3, [facility] Family Medicine FPTS Intern pager: [phone], text pages welcome

## 2013-10-07 DIAGNOSIS — R0609 Other forms of dyspnea: Secondary | ICD-10-CM

## 2013-10-07 DIAGNOSIS — R0989 Other specified symptoms and signs involving the circulatory and respiratory systems: Secondary | ICD-10-CM

## 2013-10-07 LAB — LIPID PANEL
CHOL/HDL RATIO: 4.1 ratio
CHOLESTEROL: 174 mg/dL (ref 0–200)
HDL: 42 mg/dL (ref >39)
LDL Cholesterol: 107 mg/dL — ABNORMAL HIGH (ref 0–99)
Triglycerides: 125 mg/dL (ref <150)
VLDL: 25 mg/dL (ref 0–40)

## 2013-10-07 LAB — MAGNESIUM: MAGNESIUM: 1.9 mg/dL (ref 1.5–2.5)

## 2013-10-07 LAB — BASIC METABOLIC PANEL
ANION GAP: 11 (ref 5–15)
BUN: 12 mg/dL (ref 6–23)
CALCIUM: 9.1 mg/dL (ref 8.4–10.5)
CHLORIDE: 101 meq/L (ref 96–112)
CO2: 29 mEq/L (ref 19–32)
Creatinine, Ser: 0.72 mg/dL (ref 0.50–1.10)
GFR calc non Af Amer: >90 mL/min (ref >90)
Glucose, Bld: 135 mg/dL — ABNORMAL HIGH (ref 70–99)
Potassium: 3.3 mEq/L — ABNORMAL LOW (ref 3.7–5.3)
Sodium: 141 mEq/L (ref 137–147)

## 2013-10-07 LAB — CBC WITH DIFFERENTIAL/PLATELET
BASOS ABS: 0 10*3/uL (ref 0.0–0.1)
BASOS PCT: 0 % (ref 0–1)
Eosinophils Absolute: 0.2 10*3/uL (ref 0.0–0.7)
Eosinophils Relative: 2 % (ref 0–5)
HCT: 37.9 % (ref 36.0–46.0)
Hemoglobin: 12.3 g/dL (ref 12.0–15.0)
LYMPHS PCT: 17 % (ref 12–46)
Lymphs Abs: 1.5 10*3/uL (ref 0.7–4.0)
MCH: 28.7 pg (ref 26.0–34.0)
MCHC: 32.5 g/dL (ref 30.0–36.0)
MCV: 88.3 fL (ref 78.0–100.0)
MONO ABS: 0.7 10*3/uL (ref 0.1–1.0)
Monocytes Relative: 8 % (ref 3–12)
NEUTROS ABS: 6.5 10*3/uL (ref 1.7–7.7)
NEUTROS PCT: 73 % (ref 43–77)
Platelets: 248 10*3/uL (ref 150–400)
RBC: 4.29 MIL/uL (ref 3.87–5.11)
RDW: 15.1 % (ref 11.5–15.5)
WBC: 9 10*3/uL (ref 4.0–10.5)

## 2013-10-07 LAB — TSH: TSH: 0.909 u[IU]/mL (ref 0.350–4.500)

## 2013-10-07 LAB — HEMOGLOBIN A1C
HEMOGLOBIN A1C: 7.4 % — AB (ref <5.7)
MEAN PLASMA GLUCOSE: 166 mg/dL — AB (ref <117)

## 2013-10-07 MED ORDER — FUROSEMIDE 40 MG PO TABS
40.0000 mg | ORAL_TABLET | Freq: Two times a day (BID) | ORAL | Status: DC
  Administered 2013-10-07 – 2013-10-08 (×2): 40 mg via ORAL
  Filled 2013-10-07 (×4): qty 1

## 2013-10-07 MED ORDER — MEDROXYPROGESTERONE ACETATE 150 MG/ML IM SUSP
150.0000 mg | Freq: Once | INTRAMUSCULAR | Status: AC
  Administered 2013-10-07: 150 mg via INTRAMUSCULAR
  Filled 2013-10-07: qty 1

## 2013-10-07 MED ORDER — POTASSIUM CHLORIDE CRYS ER 20 MEQ PO TBCR
40.0000 meq | EXTENDED_RELEASE_TABLET | Freq: Every day | ORAL | Status: DC
  Administered 2013-10-07: 40 meq via ORAL
  Filled 2013-10-07 (×2): qty 2

## 2013-10-07 NOTE — Progress Notes (Signed)
Family Medicine Teaching Service Daily Progress Note Intern Pager: [phone]  Patient name: [name] Medical record number: [id number] Date of birth: Jul 08, 1965 Age: 48 y.o. Gender: female  Primary Care Provider: [name], MD Consultants: None Code Status: Full   Pt Overview and Major Events to Date:  9/12: Admitted for worsening dyspnea, edema, and weight gain  9/13: Improving with IV diuretic therapy  Assessment and Plan:  [name] is a 48 y.o. female presenting with increased shortness of breath with exertion, weight gain, and whole body swelling for about 2 weeks. PMH is significant for myasthenia gravis, asthma, obesity hypoventilation syndrome/obstructive sleep apnea, osteoarthritis on chronic opiates, hypertension, and GERD.  Dyspnea and Volume Overload - Significantly improved after IV lasix therapy. Etiology most likely multifactorial with probable undiagnosed chronic CHF in setting of asthma, BOOP, OSA, and probable OHS.-Oxygen therapy to keep SpO2 >92% and monitor with ambulation  -Continue IV lasix 60 mg BID with close monitoring of renal function, transition to PO in 1-2 days   -Awaiting 2D-echo to assess for cardiac function in setting of elevated pro-BNP -Monitor BMP in setting of IV diuresis -Follow-up TSH ---> normal -Follow-up UA ---> normal with no proteinuria (no concern for nephrotic syndrome)  -Continue to monitor on telemetry  -Continue to monitor daily weight (10 lb wt loss since admission) and input/output (3.7 L net output since admission) -Repeat pro-BNP and CXR in AM  Asthma - Improved dyspnea with no current exacerbation  -Continue albuterol nebulizer Q 2 hr PRN and symbicort 2 puffs  BID   -Continue home prednisone 10 mg daily for history of BOOP -Consider outpatient pulmonary function testing   Hypokalemia - K 3.3 this AM with normal Mg (1.9) level. Etiology most likely due to IV diuresis.  -Start potassium chloride 40 mEq daily  in setting of diuresis  -Repeat BMP and Mg level in AM  Hypertension - Currently normotensive.  Pt at home on lisinopril-HCTZ 20-12.5 daily, clonidine weekly patch, and furosemide 20 mg PRN fluid retention  -Continue HCTZ 12.5 mg daily, lisinopril 20 mg daily, and clonidine weekly patch (last placed on 9/11)  Elevated Fasting Glucose - Glucose 143 this AM. Pt is on chronic corticosteroid use.  -Follow-up A1c to screen for Type II DM  Obesity Class II - BMI 67.6   -Appreciate RD consult  -Consider bariatric surgery  Hyperlipidemia - Last lipid panel in 2012 with elevated LDL. Pt not at home on statin therapy. 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 -Continue home prednisone 10 mg daily  -Pt needs follow-up appointment with pulmonologist Dr. Craige Cotta   Myasthenia Gravis s/p thymectomy -  Currently with no symptoms -Pt on mestinon as needed if symptoms worsen  Contraception - Pt at home on depo-provera 3 months.  -Administer Depo-provera 150 mg injection (reported she is due to for it today)  Right knee OA - Currently in pain without inflammation.   -Continue norco 10-325 mg Q 8 hr PRN pain -Topical muscle rub cream BID    Depression - Pt currently with stable mood. -Continue fluoxetine 40 mg daily   GERD - Currently with no acid reflux symptoms. Pt at home on omeprazole 40 mg BID.  -Continue protonix 80 mg daily   Diet: Renal with fluid restriction  DVT PPx: SQ heparin TID  Disposition: 2-3 days  Subjective:   Pt seen and examined in AM. She reports her breathing and swelling is much better. She denies fever, chills, wheezing, chest pain/tightness, or productive cough.    Objective: Temp:  [98.1 F (36.7 C)-98.8 F (37.1 C)] 98.3 F (36.8 C) (09/13 0507) Pulse Rate:   [72-102] 91 (09/13 0936) Resp:  [16-18] 18 (09/13 0936) BP: (108-139)/(62-81) 136/78 mmHg (09/13 0936) SpO2:  [99 %-100 %] 99 % (09/13 0936) Weight:  [384 lb 4.2 oz (174.3 kg)] 384 lb 4.2 oz (174.3 kg) (09/13 0507)  Physical Exam: General: Morbidly obese Cardiovascular: Normal rate and rhythm Respiratory: Clear to auscultation bilaterally with no wheezing, ronchi, or rales. Good air movement Abdomen: Soft, non-tender, non-distended with no guarding, rebound, or rigidity  Extremities: UE and LE +1-2 nonpitting edema (hard to distinguish from subcutaneous fat).  Laboratory:  Recent Labs Lab 10/05/13 2118 10/06/13 0542 10/07/13 0659  WBC 10.8* 13.4* 9.0  HGB 12.7 13.1 12.3  HCT 38.7 40.6 37.9  PLT 259 289 248    Recent Labs Lab 10/05/13 2118 10/06/13 0542 10/07/13 0659  NA 142 142 141  K 4.0 3.6* 3.3*  CL 103 103 101  CO2 27 22 29   BUN 14 12 12   CREATININE 0.64 0.66 0.72  CALCIUM 9.1 9.0 9.1  PROT  --  7.1  --   BILITOT  --  0.2*  --   ALKPHOS  --  94  --   ALT  --  15  --   AST  --  14  --   GLUCOSE 114* 143* 135*       Tequila Rottmann, MD 10/07/2013, 11:56 AM PGY-2, IMTS

## 2013-10-07 NOTE — Progress Notes (Signed)
See note as elsewhere scribed please  

## 2013-10-07 NOTE — Progress Notes (Signed)
Patient alert and oriented x4 throughout shift, vital signs stable.  Family at bedside this evening.  Patient and family deny any questions or concerns at this time.  Will continue to monitor.

## 2013-10-07 NOTE — Progress Notes (Signed)
  Echocardiogram 2D Echocardiogram has been performed.  [name] 10/07/2013, 9:07 AM

## 2013-10-07 NOTE — Progress Notes (Addendum)
Attending note  denies cough, fever chills chest tightness Off oxygen now and doesn't seem  As swollen subjective swollen Hasn't had a formal diagnosis of boop with biopsy-was given the option sounds like of bronchoscopy in the past Asthma is a diagnosis from birth Wants depo-provera as is due   Exam  pleasant not in any respiratory distress Morbid obesity, Body mass index is 68.09 kg/(m^2). Lung sounds clear although exam difficult given habitus Abdomen soft nontender nondistended no rebound Grade 2 lower extremity edema  Multifactorial dyspnea-> ?cause.has  restrictive lung component/Likely OSA playing a role as well.  relatively elevated BNP on admit  -ECHO doesn't show any type HF - Is negative 3.715 L so far for this admission on Lasix 60 every 12, which i have changed to PO 40 bid this pm 9/13 -on multiple NSAIDS for arthritis pain and has h/o htn-likely precipitatn NSAIDS and poor htn control  -doubt Pneumonia-leukocytosis 2/2 to Steroids which are chronic [consider wean as an outpatient and revisit her outpatient pulmonologist to discuss options]  -no current oxygen requirement therefore discontinue oxygen -History childhood asthma -Non-compliant overnight on CPAP--will need more reinforcement regarding this  HTn  -needs education about her meds as she thought she was on only lisinopril but was on diazide  -theoretic risk aki if on too high a dose of diuretic   Imparied glucose tolerance 2/2 to steroid  -see resident note  -A1c pending  Morbid obesity, Body mass index is 67.6 kg/(m^2).  -life threatening habitus  -get Bariatric nurse to see this admission for consideration of GOP surgery  -should be covered given 2 co-morbidities   Myasthenia Gravis s/p Thymectomy  -supposed to be on Mestinon only  "if things got worse" with symptoms  Mild HA currently-pain pill for the knees. Takes prednisone for "Lung" issues, not Myasthenia.    Mahala Menghini, [facility]

## 2013-10-07 NOTE — Progress Notes (Signed)
SATURATION QUALIFICATIONS: (This note is used to comply with regulatory documentation for home oxygen)  Patient Saturations on Room Air at Rest = 99%  Patient Saturations on Room Air while Ambulating = 97%  Patient ambulated ~13ft on room air without desaturating below 97%.

## 2013-10-08 ENCOUNTER — Inpatient Hospital Stay (HOSPITAL_COMMUNITY): Payer: Medicaid Other

## 2013-10-08 DIAGNOSIS — I10 Essential (primary) hypertension: Secondary | ICD-10-CM

## 2013-10-08 DIAGNOSIS — R0602 Shortness of breath: Secondary | ICD-10-CM

## 2013-10-08 DIAGNOSIS — K219 Gastro-esophageal reflux disease without esophagitis: Secondary | ICD-10-CM

## 2013-10-08 DIAGNOSIS — G4733 Obstructive sleep apnea (adult) (pediatric): Secondary | ICD-10-CM

## 2013-10-08 LAB — MAGNESIUM: Magnesium: 1.9 mg/dL (ref 1.5–2.5)

## 2013-10-08 LAB — BASIC METABOLIC PANEL
Anion gap: 13 (ref 5–15)
BUN: 13 mg/dL (ref 6–23)
CO2: 26 mEq/L (ref 19–32)
CREATININE: 0.76 mg/dL (ref 0.50–1.10)
Calcium: 9 mg/dL (ref 8.4–10.5)
Chloride: 103 mEq/L (ref 96–112)
Glucose, Bld: 138 mg/dL — ABNORMAL HIGH (ref 70–99)
Potassium: 3.8 mEq/L (ref 3.7–5.3)
Sodium: 142 mEq/L (ref 137–147)

## 2013-10-08 LAB — PRO B NATRIURETIC PEPTIDE: Pro B Natriuretic peptide (BNP): 9.5 pg/mL (ref 0–125)

## 2013-10-08 MED ORDER — ATORVASTATIN CALCIUM 40 MG PO TABS
40.0000 mg | ORAL_TABLET | Freq: Every day | ORAL | Status: DC
  Filled 2013-10-08: qty 1

## 2013-10-08 MED ORDER — IBUPROFEN 600 MG PO TABS
600.0000 mg | ORAL_TABLET | Freq: Four times a day (QID) | ORAL | Status: DC | PRN
Start: 2013-10-08 — End: 2013-10-08
  Filled 2013-10-08: qty 1

## 2013-10-08 MED ORDER — ATORVASTATIN CALCIUM 40 MG PO TABS: 40.0000 mg | ORAL_TABLET | Freq: Every day | ORAL | Status: DC

## 2013-10-08 MED ORDER — ETODOLAC 400 MG PO TABS: 400.0000 mg | ORAL_TABLET | Freq: Two times a day (BID) | ORAL | Status: DC | PRN

## 2013-10-08 MED ORDER — FUROSEMIDE 20 MG PO TABS: 40.0000 mg | ORAL_TABLET | Freq: Two times a day (BID) | ORAL | Status: DC

## 2013-10-08 MED ORDER — METFORMIN HCL 500 MG PO TABS
500.0000 mg | ORAL_TABLET | Freq: Every day | ORAL | Status: DC
  Filled 2013-10-08 (×2): qty 1

## 2013-10-08 MED ORDER — METFORMIN HCL 500 MG PO TABS: 500.0000 mg | ORAL_TABLET | Freq: Two times a day (BID) | ORAL | Status: DC

## 2013-10-08 NOTE — Progress Notes (Signed)
Home discharge instructions discussed with patient. Prescriptions for Lasix, Metformin and Lipitor sent to pharmacy of patients choice by MD. Patient educated on S/S to look for as well as diet, follow up appt, activity and medications. Patient states that she spoke with Danie Chandler, Student Doctor, who stated patient can hold off on taking Lipitor and Metformin until she follows up with PCP.  Patient denies questions at this time and verbally understands instructions

## 2013-10-08 NOTE — Progress Notes (Signed)
RT Note: Checked with pt about CPAP, pt stated she would try it tonight but hasn't worn in years at home and doesn't know settings. Came back at 2315 pt stated she was cold and didn't want to wear it now. I told her to let RN know when she wants to try it. RT will continue to monitor

## 2013-10-08 NOTE — Progress Notes (Signed)
Family Medicine Teaching Service Daily Progress Note Intern Pager: [phone]  Patient name: [name] Medical record number: [id number] Date of birth: 07-May-1965 Age: 48 y.o. Gender: female  Primary Care Provider: [name], MD Consultants: None  Code Status: Full   Pt Overview and Major Events to Date:  9/12: Admitted for worsening dyspnea, edema, and weight gain  9/13: Improving with IV diuretic therapy --> transitioned to po lasix; Echo normal 9/14: Appears back to baseline  Assessment and Plan:  [name] is a 48yoF female presenting with increased shortness of breath with exertion and weight gain for about 2 weeks. 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   Dyspnea on exertion, resolved: Improved after IV lasix therapy. Etiology most likely multifactorial in setting of asthma, BOOP, OSA, and probable OHS. ProBNP of 132 on admission and echo without evidence of systolic or diastolic CHF. EKG without ischemic changes. Troponin is negative. Well score for PE 0. D-dimer elevated, CT-angiogram (9/12) did not show evidence of pulmonary embolus. CT chest (9/12) showed patchy opacities in RUL and bilateral lower lobes concerning for multifactorial pneumonia however no sx of fever, cough.  -Oxygen therapy to keep SpO2 >92% and monitor with ambulation  -IV Lasix 60mg  BID (9/12-9/13)--> PO Lasix 40mg  BID starting 9/13 (home dose 20 qd) -2D-echo: EF 60-65%, nl cavity size  -proBNP 9.5 (from 132); CXR (9/14) no cardiopulm abnormalities  -TSH normal -UA normal (no proteinuria) -Continue to monitor on telemetry  -Continue to monitor daily weight (11 lb wt loss since admission) and input/output (4.3 L net output since admission)   Asthma - Improved dyspnea with no current exacerbation  -Continue albuterol nebulizer Q 2 hr PRN and symbicort 2 puffs  BID  -Continue home prednisone 10 mg daily for history of BOOP  -Consider outpatient pulmonary function testing   Hypokalemia, resolved - K 3.3 on 9/13 with normal Mg level. Etiology most likely due to IV diuresis. Normal K (3.8) and Mg (1.9) on 9/14 - S/p potassium chloride 40 mEq 9/14; d/c on 9/14  Hypertension - Currently normotensive. Pt at home on lisinopril-HCTZ 20-12.5 daily, clonidine weekly patch, and furosemide 20 mg PRN fluid retention  -Continue HCTZ 12.5 mg daily, lisinopril 20 mg daily, and clonidine weekly patch (last placed on 9/11)   Type II DM: Appears newly diagnosed, as patient does not have previous HgA1c's in EMR and office notes do not reference diabetes.  -HgA1c (9/14): 7.4% -Started on metformin 500mg  daily   Obesity Class II - BMI 67.6  -Appreciate RD consult  -Consider bariatric surgery   Hyperlipidemia - Last lipid panel in 2012 with elevated LDL. Pt not at home on statin therapy. ASCVD 9.6% in context of Type II DM.  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  -Continue home prednisone 10 mg daily  -Pt needs follow-up appointment with pulmonologist Dr. 12-28-1972   Myasthenia Gravis s/p thymectomy - Currently with no symptoms  -Pt on mestinon as needed if symptoms worsen   Contraception - Pt at home on depo-provera 3 months.  -Administered Depo-provera 150 mg injection 9/14  Right knee OA - Currently in pain without inflammation.  -Continue norco 10-325 mg Q 8 hr PRN pain  -Topical muscle rub cream  BID   Depression - Pt currently with stable mood.  -Continue fluoxetine 40 mg daily   GERD - Currently with no acid reflux symptoms. Pt at home on omeprazole 40 mg BID.  -Continue protonix 80 mg daily   Diet: Renal with fluid restriction   DVT PPx: SQ heparin TID   Disposition: 2-3  days   Subjective:  Breathing improved without coughing or phlegm. Not SOB walking to the bathroom. Feels back to baseline. Reports sleeping with several pillows or else she feels like she is "drowning." No PND. No fevers, chills, sweats. Swelling has improved to just slightly more than her baseline. She wants to go home.   Objective: Temp:  [98.1 F (36.7 C)-98.9 F (37.2 C)] 98.9 F (37.2 C) (09/14 0327) Pulse Rate:  [86-96] 86 (09/14 0327) Resp:  [18-19] 19 (09/14 0327) BP: (101-136)/(60-81) 131/76 mmHg (09/14 0327) SpO2:  [99 %-100 %] 100 % (09/14 0327) Weight:  [383 lb 6.1 oz (173.9 kg)] 383 lb 6.1 oz (173.9 kg) (09/14 0327)  Physical Exam: General: Well-appearing, obese woman, in NAD Cardiovascular: RRR, no m/r/g Respiratory: CTAB without wheezing, ronchi, rales. Normal WOB Abdomen: Soft, non-tender, non-distended. +bs Extremities: Trace pitting edema of left foot through mid-calf. Trace pitting edema of right foot.  Laboratory:  Recent Labs Lab 10/05/13 2118 10/06/13 0542 10/07/13 0659  WBC 10.8* 13.4* 9.0  HGB 12.7 13.1 12.3  HCT 38.7 40.6 37.9  PLT 259 289 248    Recent Labs Lab 10/06/13 0542 10/07/13 0659 10/08/13 0446  NA 142 141 142  K 3.6* 3.3* 3.8  CL 103 101 103  CO2 22 29 26   BUN 12 12 13   CREATININE 0.66 0.72 0.76  CALCIUM 9.0 9.1 9.0  PROT 7.1  --   --   BILITOT 0.2*  --   --   ALKPHOS 94  --   --   ALT 15  --   --   AST 14  --   --   GLUCOSE 143* 135* 138*   Mg 1.9 proBNP 9.5 a1c 7.4 TSH 0.909 Lipids (174 total, 125 tri, 42 HDL, 107 LDL)  Urine dipstick shows negative for all components.  Micro exam: not done.   Imaging/Diagnostic Tests: Dg Chest 2 View  10/08/2013    IMPRESSION: No acute cardiopulmonary disease.    Dg Chest 2 View  10/06/2013    IMPRESSION: Right basilar atelectasis.    Ct Angio Chest Pe W/cm &/or Wo Cm  10/06/2013    IMPRESSION: 1. No evidence of pulmonary embolus. 2. Patchy airspace opacities in the  periphery of the right upper lobe and to a lesser extent within the lower lobes bilaterally, raising concern for multifocal pneumonia. 3. 3 mm nonobstructing stone at the upper pole of the left kidney.    12/06/2013, Med Student 10/08/2013, 8:36 AM Acting Intern, [facility] FPTS Intern pager: [phone], text pages welcome  I have seen and evaluated the above patient with student doctor Cronk and agree with the above note.  Discharging patient today.  Physical exam: General: Well-appearing obese female. NAD.  Cardiovascular: RRR, no m/r/g Respiratory: CTAB.  Abdomen: Obese, soft, non-tender, non-distended.  Extremities:  No appreciable LE edema.  10/10/2013 DO Family Medicine PGY-3

## 2013-10-08 NOTE — Progress Notes (Signed)
Patients ride her at hospital to take patient home for discharge. Patient taken out of hospital via wheelchair.

## 2013-10-08 NOTE — Progress Notes (Signed)
FMTS ATTENDING  NOTE Sara Saal,MD I  have seen and examined this patient, reviewed their chart. I have discussed this patient with the resident. I agree with the resident's findings, assessment and care plan. 

## 2013-10-08 NOTE — Discharge Summary (Signed)
Family Medicine Teaching [facility] Discharge Summary  Patient name: [name] Medical record number: [id number] Date of birth: August 19, 1965 Age: 48 y.o. Gender: female Date of Admission: 10/05/2013  Date of Discharge: 10/08/2013 Admitting Physician: Tobey Grim, MD  Primary Care Provider: [name], MD Consultants: None  Indication for Hospitalization: Dyspnea on exertion  Discharge Diagnoses/Problem List:  Dyspnea on exertion Type II DM HLD OSA BOOP Contraception OA  Depression GERD  Disposition: Discharge to home   Discharge Condition: Stable  Discharge Exam:  General: Well-appearing, obese woman, in NAD  Cardiovascular: RRR, no m/r/g  Respiratory: CTAB without wheezing, ronchi, rales. Normal WOB  Abdomen: Soft, non-tender, non-distended. +bs  Extremities: Trace pitting edema of left foot through mid-calf. Trace pitting edema of right foot.  Brief Hospital Course:  [name] is a 53yoF female who presented on 9/12 with increased shortness of breath with exertion and weight gain for about 2 weeks concerning for possible CHF exacerbation. 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   Dyspnea on exertion: Patient presented complaining of worsening shortness of breath on exertion, weight gain for approximately 2 weeks, and, per patient, "generalized body swelling" though she did not have crackles or edema on exam (though limited exam due to body habitus). On admission her proBNP was 132 and her CXR showed right basilar atelectasis. Her EKG was normal and troponin was negative. CT angiogram did not show evidence of pulmonary embolus. CT chest showed patchy opacities in RUL and bilateral lower lobes concerning for multifactorial pneumonia. However, she had no symptoms, was afebrile, and a only mild leukocytosis in the setting of  chronic prednisone use, and thus was not treated with antibiotics. TSH and UA were normal. Ultimately, the primary team that admitted her treated like a CHF exacerbation with IV Lasix 60mg  BID x 3 doses (9/12-9/13). She improved after diuretic therapy and had no renal disturbances. She was transitioned to oral Lasix 40mg  BID on 9/13, which she was discharged home on (previous home dose 20mg  daily). Her weight decreased 11lbs (394 to 383) and she had 4.3L net output. An echocardiogram did not show evidence of systolic or diastolic heart failure with an EF of 60-65% and normal cavity size. Her repeat BNP on discharge was 9.5 and CXR showed no cardiopulmonary abnormalities.  She did not have nephrotic syndrome (no proteinuria on UA), liver disease that could result in volume overload. Her albumin was low (3.1) on admission thus hypoalbuminemia may be contributing to volume overload. Other possible contributors contributors include asthma, BOOP, OSA, and probable OHS.   Newly diagnosed type II DM: Appears newly diagnosed, as patient does not have previous HgA1c's in EMR and office notes do not reference diabetes. HgA1c of 7.4%. Patient given counseling and Rx for metformin 500mg  BID, however, patient reports she may not take it until her PCP visit in a couple weeks. Encouraged to continue lifestyle modifications in meantime.   Hyperlipidemia: ASCVD 9.6% in context of Type II DM. Lipid panel: total 174, triglycerides 125, HDL 42, LDL 107. Discharged on atorvostatin 40mg . Patient reports she may not take it until her PCP visit in a couple weeks.  OSA:  Pt reports noncompliance with CPAP therapy. CPAP at night as tolearated.  History of BOOP: Continued home prednisone 10mg  daily. Needs follow-up with pulmonologist Dr. .   Contraception:  Pt at home on depo-provera 3 months. Administered Depo-provera 150 mg injection 9/14  Right knee OA: Currently in pain without inflammation. Given norco 10-325mg  q8h prn  and topical muscle rub cream BID.  Depression: Pt currently with stable mood. Continued on fluoxetine 40 mg daily.  GERD - Currently with no acid reflux symptoms. Continued protonix 80 mg daily.   Issues for Follow Up:  1) Follow-up dyspnea on exertion  2) Follow-up Lasix dose (increased to 40mg  BID from previous home dose 20mg  qd) 3) Follow-up newly dx T2DM (gave Rx for metformin 500mg  BID) 4) Follow-up new Rx of atorvostatin   Significant Procedures: None   Significant Labs and Imaging:   Recent Labs Lab 10/05/13 2118 10/06/13 0542 10/07/13 0659  WBC 10.8* 13.4* 9.0  HGB 12.7 13.1 12.3  HCT 38.7 40.6 37.9  PLT 259 289 248    Recent Labs Lab 10/05/13 2118 10/06/13 0542 10/07/13 0659 10/08/13 0446  NA 142 142 141 142  K 4.0 3.6* 3.3* 3.8  CL 103 103 101 103  CO2 27 22 29 26   GLUCOSE 114* 143* 135* 138*  BUN 14 12 12 13   CREATININE 0.64 0.66 0.72 0.76  CALCIUM 9.1 9.0 9.1 9.0  MG  --  2.0 1.9 1.9  ALKPHOS  --  94  --   --   AST  --  14  --   --   ALT  --  15  --   --   ALBUMIN  --  3.1*  --   --     Results/Tests Pending at Time of Discharge: None.  Discharge Medications:    Medication List    STOP taking these medications       meloxicam 15 MG tablet  Commonly known as:  MOBIC      TAKE these medications       albuterol 108 (90 BASE) MCG/ACT inhaler  Commonly known as:  PROVENTIL HFA;VENTOLIN HFA  Inhale 1 puff into the lungs every 4 (four) hours as needed for wheezing or shortness of breath.     atorvastatin 40 MG tablet  Commonly known as:  LIPITOR  Take 1 tablet (40 mg total) by mouth daily.     budesonide-formoterol 160-4.5 MCG/ACT inhaler  Commonly known as:  SYMBICORT  Inhale 2 puffs into the lungs 2 (two) times daily as needed (for shortness of breath).     cetirizine 10 MG tablet  Commonly known as:  ZYRTEC  Take 1 tablet (10 mg total) by mouth daily.     cloNIDine 0.3 mg/24hr patch  Commonly known as:  CATAPRES - Dosed in mg/24  hr  Place 0.3 mg onto the skin once a week. Change on Friday.     dexamethasone 4 MG tablet  Commonly known as:  DECADRON  Take 2 mg by mouth daily as needed (arthritic flares).     etodolac 400 MG tablet  Commonly known as:  LODINE  Take 1 tablet (400 mg total) by mouth 2 (two) times daily as needed.     FLUoxetine 40 MG capsule  Commonly known as:  PROZAC  Take 1 capsule (40 mg total) by mouth daily.     furosemide 20 MG tablet  Commonly known as:  LASIX  Take 2 tablets (40 mg total) by mouth 2 (two) times daily.     HYDROcodone-acetaminophen 10-325 MG per tablet  Commonly known as:  NORCO  Take 1 tablet by mouth every 8 (eight) hours as needed. Please fill 60 days after the date on this prescription.     lisinopril-hydrochlorothiazide 20-12.5 MG  per tablet  Commonly known as:  PRINZIDE,ZESTORETIC  Take 1 tablet by mouth daily.     medroxyPROGESTERone 150 MG/ML injection  Commonly known as:  DEPO-PROVERA  Inject 150 mg into the muscle every 3 (three) months.     metFORMIN 500 MG tablet  Commonly known as:  GLUCOPHAGE  Take 1 tablet (500 mg total) by mouth 2 (two) times daily.     montelukast 10 MG tablet  Commonly known as:  SINGULAIR  Take 10 mg by mouth at bedtime.     omeprazole 40 MG capsule  Commonly known as:  PRILOSEC  Take 40 mg by mouth 2 (two) times daily.     oxybutynin 5 MG tablet  Commonly known as:  DITROPAN  Take 5 mg by mouth 2 (two) times daily.     polyethylene glycol packet  Commonly known as:  MIRALAX / GLYCOLAX  Take 17 g by mouth daily as needed for mild constipation.     predniSONE 10 MG tablet  Commonly known as:  DELTASONE  Take 10 mg by mouth daily with breakfast.     Slip-On Knee Brace Misc  Please evaluate/measure and dispense appropriate braces for severe DJD. Call [phone] with any questions.        Discharge Instructions: Please refer to Patient Instructions section of EMR for full details.  Patient was counseled important  signs and symptoms that should prompt return to medical care, changes in medications, dietary instructions, activity restrictions, and follow up appointments.   Follow-Up Appointments:     Follow-up Information   Follow up with [name], MD On 10/16/2013. (1:45 PM)    Specialty:  Family Medicine   Contact information:   [address] [place] [address] [phone]       Resa Miner, Med Student 10/08/2013, 1:58 PM Acting Intern, [facility]  I have seen the above patient and agree with the note.  Everlene Other DO Family Medicine PGY-3

## 2013-10-08 NOTE — Progress Notes (Signed)
Telemetry discontinued. CCMD notified. Monitor cleaned.

## 2013-10-08 NOTE — Discharge Instructions (Signed)
Please follow up closely with your primary physician. Take the Lasix as prescribed.  Shortness of Breath Shortness of breath means you have trouble breathing. Shortness of breath needs medical care right away. HOME CARE   Do not smoke.  Avoid being around chemicals or things (paint fumes, dust) that may bother your breathing.  Rest as needed. Slowly begin your normal activities.  Only take medicines as told by your doctor.  Keep all doctor visits as told. GET HELP RIGHT AWAY IF:   Your shortness of breath gets worse.  You feel lightheaded, pass out (faint), or have a cough that is not helped by medicine.  You cough up blood.  You have pain with breathing.  You have pain in your chest, arms, shoulders, or belly (abdomen).  You have a fever.  You cannot walk up stairs or exercise the way you normally do.  You do not get better in the time expected.  You have a hard time doing normal activities even with rest.  You have problems with your medicines.  You have any new symptoms. MAKE SURE YOU:  Understand these instructions.  Will watch your condition.  Will get help right away if you are not doing well or get worse. Document Released: 06/30/2007 Document Revised: 01/16/2013 Document Reviewed: 03/29/2011 [facility] Patient Information 2015 [place], [place]. This information is not intended to replace advice given to you by your health care provider. Make sure you discuss any questions you have with your health care provider.

## 2013-10-09 NOTE — Discharge Summary (Signed)
FMTS ATTENDING  NOTE Kehinde Eniola,MD I  have seen and examined this patient, reviewed their chart. I have discussed this patient with the resident. I agree with the resident's findings, assessment and care plan. 

## 2013-10-16 ENCOUNTER — Ambulatory Visit (INDEPENDENT_AMBULATORY_CARE_PROVIDER_SITE_OTHER): Payer: Medicaid Other | Admitting: Family Medicine

## 2013-10-16 ENCOUNTER — Encounter: Payer: Self-pay | Admitting: Family Medicine

## 2013-10-16 VITALS — BP 132/81 | HR 105 | Temp 99.9°F | Ht 64.0 in | Wt 379.1 lb

## 2013-10-16 DIAGNOSIS — M174 Other bilateral secondary osteoarthritis of knee: Secondary | ICD-10-CM

## 2013-10-16 DIAGNOSIS — R0989 Other specified symptoms and signs involving the circulatory and respiratory systems: Secondary | ICD-10-CM

## 2013-10-16 DIAGNOSIS — R197 Diarrhea, unspecified: Secondary | ICD-10-CM

## 2013-10-16 DIAGNOSIS — M175 Other unilateral secondary osteoarthritis of knee: Secondary | ICD-10-CM

## 2013-10-16 DIAGNOSIS — I10 Essential (primary) hypertension: Secondary | ICD-10-CM

## 2013-10-16 DIAGNOSIS — R0609 Other forms of dyspnea: Secondary | ICD-10-CM

## 2013-10-16 MED ORDER — OXYCODONE HCL 5 MG PO TABS: 5.0000 mg | ORAL_TABLET | Freq: Three times a day (TID) | ORAL | Status: DC | PRN

## 2013-10-16 NOTE — Patient Instructions (Signed)
Nice to see you. Please try the oxycodone as a pain medication for your knees. Please do not take the aleve with the lodine. Please make sure you stay hydrated while you have diarrhea. If you are not improving, you develop blood in your stool, worsening diarrhea, fever, or inability to stay well hydrated you need to return to clinic.

## 2013-10-16 NOTE — Assessment & Plan Note (Signed)
Continue to have pain that is not adequately controlled on norco. She is attempting to lose weight with water aerobics and ultimately weight loss will be the most beneficial thing for her DJD. We will change her to oxycodone 5 mg q8 hrs prn for severe pain to see if this will be more beneficial. Advised to use lodine and not aleve for her NSAID. Dexamethasone prn. Will need to continue to follow-up with ortho. F/u in one month.

## 2013-10-16 NOTE — Assessment & Plan Note (Signed)
Improved from recently hospitalization. Sounds like this was related to volume overload, though patient had normal EF and no diastolic dysfunction on echo. Likely related to OHS. Will continue lasix at this time as this was beneficial for this issue. Will check BMET given change in lasix dosing.

## 2013-10-16 NOTE — Progress Notes (Signed)
Patient ID: [name], female   DOB: 1965/07/23, 48 y.o.   MRN: [id number]  [name], MD Phone: [phone]  SADYE KIERNAN is a 48 y.o. female who presents today for hospital follow-up.  Hospital follow-up: patient was hospitalized for dyspnea and diuresed with lasix. She has not had issues with dyspnea since leaving the hospital. She feels the lasix is helping. There are days when she only takes it once a day when she goes to water aerobics. Her weight is down from 383 to 379 since discharge. Newly diagnosed with DM in the hospital and started on metformin. Has been taking this daily.  Diarrhea: patient notes her son has had diarrhea and fever recently. She notes starting last night she had bloating, nausea, and diarrhea. No vomiting. Her appetite is not as good as usual, though she is drinking her usual amount.   Osteoarthritis of knees: continues to have discomfort with this. She notes discomfort is in bilateral knees. She notes going to water aerobics helps. She has been taking norco for this pain and notes this does not help as much now. She has been taking lodine and aleve. Takes dexamethasone prn if she has a flare. She is followed by ortho for this issue as well.   Patient is a nonsmoker.   ROS: Per HPI   Physical Exam Filed Vitals:   10/16/13 1413  BP: 132/81  Pulse: 105  Temp: 99.9 F (37.7 C)    Gen: Well NAD HEENT: PERRL,  MMM Lungs: CTABL Nl WOB Heart: RRR no MRG MSK: knee exam limited by body habitus, though there is mild tenderness to palpation of the bilateral lateral joint lines, no apparent swelling of the knees Abd: s, NT, ND, obese Exts: Non edematous BL  LE, warm and well perfused.    Assessment/Plan: Please see individual problem list.  # Healthcare maintenance: up to date  [name], MD [facility] PGY-3

## 2013-10-16 NOTE — Assessment & Plan Note (Signed)
Likely viral gastro given sick contact of son. Advised on hydration and return precautions. If not improving by the end of the week patient is to return and would consider stool studies at that time. F/u prn.

## 2013-10-17 LAB — BASIC METABOLIC PANEL
BUN: 11 mg/dL (ref 6–23)
CALCIUM: 9.5 mg/dL (ref 8.4–10.5)
CO2: 25 meq/L (ref 19–32)
CREATININE: 0.74 mg/dL (ref 0.50–1.10)
Chloride: 105 mEq/L (ref 96–112)
Glucose, Bld: 110 mg/dL — ABNORMAL HIGH (ref 70–99)
Potassium: 3.9 mEq/L (ref 3.5–5.3)
Sodium: 142 mEq/L (ref 135–145)

## 2013-10-18 ENCOUNTER — Encounter: Payer: Self-pay | Admitting: Family Medicine

## 2013-11-05 ENCOUNTER — Encounter: Payer: Self-pay | Admitting: Family Medicine

## 2013-11-05 ENCOUNTER — Ambulatory Visit (INDEPENDENT_AMBULATORY_CARE_PROVIDER_SITE_OTHER): Payer: Medicaid Other | Admitting: Family Medicine

## 2013-11-05 VITALS — BP 150/96 | HR 82 | Temp 99.6°F | Wt 383.0 lb

## 2013-11-05 DIAGNOSIS — R0609 Other forms of dyspnea: Secondary | ICD-10-CM

## 2013-11-05 DIAGNOSIS — R079 Chest pain, unspecified: Secondary | ICD-10-CM

## 2013-11-05 DIAGNOSIS — I10 Essential (primary) hypertension: Secondary | ICD-10-CM

## 2013-11-05 DIAGNOSIS — K921 Melena: Secondary | ICD-10-CM

## 2013-11-05 DIAGNOSIS — R197 Diarrhea, unspecified: Secondary | ICD-10-CM

## 2013-11-05 DIAGNOSIS — G4733 Obstructive sleep apnea (adult) (pediatric): Secondary | ICD-10-CM

## 2013-11-05 DIAGNOSIS — R768 Other specified abnormal immunological findings in serum: Secondary | ICD-10-CM

## 2013-11-05 NOTE — Patient Instructions (Signed)
Nice to see you. If you develop worsening shortness of breath, recurrent chest pain, nausea, vomiting, blood in your stool, worsening diarrhea please seek medical attention. We will call with the results of your tests.

## 2013-11-06 ENCOUNTER — Encounter: Payer: Self-pay | Admitting: Internal Medicine

## 2013-11-06 DIAGNOSIS — R768 Other specified abnormal immunological findings in serum: Secondary | ICD-10-CM | POA: Insufficient documentation

## 2013-11-06 DIAGNOSIS — K921 Melena: Secondary | ICD-10-CM | POA: Insufficient documentation

## 2013-11-06 DIAGNOSIS — R079 Chest pain, unspecified: Secondary | ICD-10-CM | POA: Insufficient documentation

## 2013-11-06 LAB — CENTROMERE ANTIBODIES: Centromere Ab Screen: <1

## 2013-11-06 LAB — ANTI-SCLERODERMA ANTIBODY: Scleroderma (Scl-70) (ENA) Antibody, IgG: <1

## 2013-11-06 NOTE — Assessment & Plan Note (Addendum)
Patient with reported blood in her stool 2 months ago. No recurrence. Has aunt with history of colon cancer. Patient given stool cards to evaluate for current blood in stool. Will refer to GI for additional work-up with possible colonoscopy.

## 2013-11-06 NOTE — Assessment & Plan Note (Signed)
Patient with one episode of atypical chest pain. Has not previously had chest pain and has not recurred. No current chest pain or dyspnea. Suspect this is MSK in origin. Consider additional work up if this recurs. F/u if recurs. Given return precautions.

## 2013-11-06 NOTE — Progress Notes (Signed)
Patient ID: [name], female   DOB: 12-30-1965, 48 y.o.   MRN: [id number]  [name], MD Phone: [phone]  [name] is a 48 y.o. female who presents today for f/u.  HYPERTENSION Disease Monitoring Home BP Monitoring not checking Chest pain- see below    Dyspnea- see below Medications Compliance-  taking  Edema- no  Chest pain: patient notes one episode of chest pain a couple days ago. It was sharp in nature. In the middle of her chest. Went away on its own after a few minutes. She was sitting down when it occurred. It has not recurred. Did not radiate. Has not had chest pain previously. No diaphoresis with this. She did have brief dyspnea when the pain came on that resolved quickly.   Dyspnea: she notes this comes and goes. Is worse when she is walking around. She uses a fan to to give her more air. She notes some orthopnea and sleeps on 3 pillows, though has not changed this recently. She notes she wakes up gasping for air occasionally. She snores. She does not wake up well rested. She breaths better if she raises her arms up above her head. She has OSA and previously had a CPAP, though states she thinks this was stolen. She recently was hospitalized for dyspnea and put on lasix to diurese fluid and had improvement. Echo 10/07/13 with normal EF and no wall motion abnormalities.  Abdominal discomfort: reports it feels as though something is swelling in her LUQ. There is an achey pain in the LUQ. This has been present for months. She notes frequent diarrhea over this time span. She does not bloody BM 2 months ago with blood in the toilet water. No blood since that time. She did not report this at her previous office visits. She notes nauseated all the time. No vomiting. Patient reports she has an aunt with history of colon cancer.   Patient also reports that her orthopedist told her that she may have scleroderma. He obtained labs to evaluated for lupus and RA. RF lab was  negative. ANA was 1:40. SLE confirmatory labs were negative. She states he advised her to discuss further testing for scleroderma with her PCP. She is noted to have arthritis in her knees. She does not have tightness of the skin or change in skin color with cold temperatures.    Patient is a nonsmoker.   ROS: Per HPI   Physical Exam Filed Vitals:   11/05/13 1040  BP: 150/96  Pulse: 82  Temp: 99.6 F (37.6 C)    Gen: Well NAD HEENT: PERRL,  MMM Lungs: CTABL Nl WOB Heart: RRR no MRG Abd: soft, mild tenderness to palpation LUQ, no guarding or rebound, no masses palpated, ND Exts: Non edematous BL  LE, warm and well perfused.    Assessment/Plan: Please see individual problem list.  # Healthcare maintenance: up to date  [name], MD [facility] PGY-3

## 2013-11-06 NOTE — Assessment & Plan Note (Addendum)
Patient with continued dyspnea on exertion. Echo at last hospitalization did not reveal any heart failure and only structural issue was trivial tricuspid regurgitation making cardiac cause of this unlikely. Likely this is related to patients morbid obesity and poor conditioning. Her currently untreated OSA and OHS could also be contributing to this issue. Also consider asthma, though no wheezing on exam. Will have patient go for sleep study to start back on CPAP for OSA. She really needs to lose weight to help improve her shortness of breath. F/u in one month.  Precepted with Dr Lum Babe

## 2013-11-06 NOTE — Assessment & Plan Note (Signed)
ANA weakly positive. Ortho concerned for scleroderma per patient report. Will obtain additional blood work to evaluate for scleroderma.

## 2013-11-06 NOTE — Assessment & Plan Note (Signed)
Not at goal. Patients blood pressure is typically elevated above goal on presentation to the clinic, though after a period of time of rest this returns to the normal range. I would like for the patient to be seen in pharmacy clinic for 24 hour blood pressure monitor to determine where her blood pressure is most of the time. Will continue current medications at this time.

## 2013-11-06 NOTE — Assessment & Plan Note (Addendum)
Patient has had continued diarrhea. Given this will check stool culture, giardia, crypto, and c diff. Advised to stay hydrated. F/u if not improving.

## 2013-11-08 ENCOUNTER — Ambulatory Visit (INDEPENDENT_AMBULATORY_CARE_PROVIDER_SITE_OTHER): Payer: Medicaid Other | Admitting: Pharmacist

## 2013-11-08 VITALS — BP 110/82 | HR 103 | Ht 64.0 in | Wt 380.7 lb

## 2013-11-08 DIAGNOSIS — I10 Essential (primary) hypertension: Secondary | ICD-10-CM

## 2013-11-08 NOTE — Patient Instructions (Addendum)
Wearing the Blood Pressure Monitor  The cuff will inflate every 20 minutes during the day and every 30 minutes while you sleep.  Your blood pressure readings will NOT display after cuff inflation  Fill out the blood pressure-activity diary during the day, especially during activities that may affect your reading -- such as exercise, stress, walking, taking your blood pressure medications  Important things to know:  Avoid taking the monitor off for the next 24 hours, unless it causes you discomfort or pain.  Do NOT get the monitor wet and do NOT dry to clean the monitor with any cleaning products.  Do NOT drop the monitor.  Do NOT put the monitor on anyone else's arm.  When the cuff inflates, avoid excess movement. Let the cuffed arm hang loosely, slightly away from the body. Avoid flexing the muscles or moving the hand/fingers.  When you go to sleep, make sure that the hose is not kinked.  Remember to fill out the blood pressure activity diary.  If you experience severe pain or unusual pain (not associated with getting your blood pressure checked), remove the monitor.  Troubleshooting:  Code  Troubleshooting   1  Check cuff position, tighten cuff   2, 3  Remain still during reading   4, 87  Check air hose connections and make sure cuff is tight   85, 89  Check hose connections and make tubing is not crimped   86  Push START/STOP to restart reading   88, 91  Retry by pushing START/STOP   90  Replace batteries. If problem persists, remove monitor and bring back to   clinic at follow up   97, 98, 99  Service required - Remove monitor and bring back to clinic at follow up    Blood Pressure Activity Diary  Time Lying down/ Sleeping Walking/ Exercise Stressed/ Angry Headache/ Pain Dizzy  9 AM       10 AM       11 AM       12 PM       1 PM       2 PM       Time Lying down/ Sleeping Walking/ Exercise Stressed/ Angry Headache/ Pain Dizzy  3 PM       4 PM        5 PM       6  PM       7 PM       8 PM       Time Lying down/ Sleeping Walking/ Exercise Stressed/ Angry Headache/ Pain Dizzy  9 PM       10 PM       11 PM       12 AM       1 AM       2 AM       3 AM       Time Lying down/ Sleeping Walking/ Exercise Stressed/ Angry Headache/ Pain Dizzy  4 AM       5 AM       6 AM       7 AM       8 AM       9 AM       10 AM        Time you woke up: _________                  Time you went to sleep:__________  Come back tomorrow at 1:30 to return monitor and review the results  Call the [facility] if you have any questions before then [phone])

## 2013-11-08 NOTE — Progress Notes (Signed)
S:    Patient arrives walking with the assistance of a cain, in relatively good spirits. She presents to the clinic for ambulatory blood pressure evaluation.  Longstanding diagnosis of hypertension.    Medication compliance is reported to be good but since patient has started swimming, her clonidine patch has trouble staying on.  Discussed procedure for wearing the monitor and gave patient written instructions. Monitor was placed on non-dominant arm with instructions to return in the morning.   Current BP Medications include:  Clonidine patch and lisinopril/HCTZ  Antihypertensives tried in the past include: amlodipine  Patient returned to the clinic and reported no issues with use of Amb BP Monitor.   O:  Last 3 Office BP readings: 150/96 mmHg 132/81 mmHg 110/63 mmHg  Today's Office BP reading: 110/82 mmHg (manual reading)  ABPM Study Data: Arm Placement left arm   Overall Mean 24hr BP:   119/74  mmHg HR: 77   Daytime Mean BP:  123/76  mmHg HR: 83   Nighttime Mean BP:  112/70  mmHg HR: 65   Dipping Pattern: Yes.    Sys:   8.6%   Dia: 8.3%   [normal dipping ~10-20%]   Non-hypertensive ABPM thresholds: daytime BP <135/85 mmHg, sleeptime BP <120/70 mmHg NICE Hypertension Guidelines ([place]) using ABPM: Stage I: >135/85 mmHg, Stage 2: >150/95 mmHg)   BMET    Component Value Date/Time   NA 142 10/16/2013 1447   K 3.9 10/16/2013 1447   CL 105 10/16/2013 1447   CO2 25 10/16/2013 1447   GLUCOSE 110* 10/16/2013 1447   BUN 11 10/16/2013 1447   CREATININE 0.74 10/16/2013 1447   CREATININE 0.76 10/08/2013 0446   CALCIUM 9.5 10/16/2013 1447   GFRNONAA >90 10/08/2013 0446   GFRAA >90 10/08/2013 0446    A/P: Longstanding history of hypertension.  Found to have control given 24-hour ambulatory blood pressure demonstrating overall average blood pressure of 119/74 mmHg, and a nocturnal dipping pattern that is abnormal  with only 8% dipping.  Changes to medications dosing time by shifting  lisinopril/HCTZ to PM dosing.   Patient to inform us if increase in urination occurs at night and if this is bothersome.  No up titration of medication at this time. Results reviewed and written information provided.   Continued to encourage weight loss through increased exercise and activity.   F/U Clinic Visit with Dr. Birdie Sons, MD.   Total time in face-to-face counseling  35 minutes.  Patient seen with Cordella Register, PharmD Candidate and Renold Don,  PharmD Resident.

## 2013-11-09 ENCOUNTER — Other Ambulatory Visit: Payer: Self-pay | Admitting: Family Medicine

## 2013-11-09 NOTE — Addendum Note (Signed)
Addended by: Herminio Heads on: 11/09/2013 02:29 PM   Modules accepted: Orders

## 2013-11-09 NOTE — Assessment & Plan Note (Signed)
Longstanding history of hypertension.  Found to have control given 24-hour ambulatory blood pressure demonstrating overall average blood pressure of 119/74 mmHg, and a nocturnal dipping pattern that is abnormal  with only 8% dipping.  Changes to medications dosing time by shifting lisinopril/HCTZ to PM dosing.   Patient to inform us if increase in urination occurs at night and if this is bothersome.  No up titration of medication at this time. Results reviewed and written information provided.   Continued to encourage weight loss through increased exercise and activity.

## 2013-11-10 ENCOUNTER — Encounter: Payer: Self-pay | Admitting: Pharmacist

## 2013-11-10 LAB — C. DIFFICILE GDH AND TOXIN A/B
C. DIFF TOXIN A/B: NOT DETECTED
C. DIFFICILE GDH: NOT DETECTED

## 2013-11-10 NOTE — Progress Notes (Signed)
Patient ID: [name], female   DOB: [birth year], 48 y.o.   MRN: [id number] Reviewed: Agree with Dr. Macky Lower documentation and management.

## 2013-11-12 LAB — GIARDIA/CRYPTOSPORIDIUM (EIA)
Cryptosporidium Screen (EIA): NEGATIVE
Giardia Screen (EIA): NEGATIVE

## 2013-11-13 LAB — STOOL CULTURE

## 2013-11-15 LAB — RNA POLYMERASE III ABY

## 2013-11-20 LAB — POC HEMOCCULT BLD/STL (HOME/3-CARD/SCREEN)
Card #3 Fecal Occult Blood, POC: NEGATIVE
FECAL OCCULT BLD: NEGATIVE
Fecal Occult Blood, POC: NEGATIVE

## 2013-11-20 NOTE — Addendum Note (Signed)
Addended by: [place], [place] Janus on: 11/20/2013 11:48 AM   Modules accepted: Orders

## 2013-11-22 ENCOUNTER — Encounter: Payer: Self-pay | Admitting: Family Medicine

## 2013-11-27 ENCOUNTER — Other Ambulatory Visit: Payer: Self-pay | Admitting: Family Medicine

## 2013-11-27 MED ORDER — OXYCODONE HCL 5 MG PO TABS: 5.0000 mg | ORAL_TABLET | Freq: Three times a day (TID) | ORAL | Status: DC | PRN

## 2013-11-27 NOTE — Progress Notes (Signed)
Pt requesting refill.. Last refill was 10/16/2013.  Dr. De Nurse paged and ok given to write new Rx. Nollie Shiflett, Maryjo Rochester

## 2013-12-03 ENCOUNTER — Ambulatory Visit: Payer: Self-pay | Admitting: Family Medicine

## 2013-12-12 ENCOUNTER — Telehealth: Payer: Self-pay | Admitting: *Deleted

## 2013-12-12 NOTE — Telephone Encounter (Signed)
Pharmacist called from [facility] stating pt brought in another Rx for Hydrocodone 10/325 #90 from a Dr. Montez Morita [phone].  Rx for oxyCODONE (OXY IR/ROXICODONE) 5 MG immediate release #90 given 11/27/2013 from [facility] please give the pharmacy a call at [phone].  Clovis Pu, RN

## 2013-12-12 NOTE — Telephone Encounter (Signed)
Spoke with pharmacist regarding this. Advised to have them check with Dr Celene Skeen office tomorrow regarding the vicodin prescription.

## 2013-12-18 ENCOUNTER — Encounter: Payer: Self-pay | Admitting: Family Medicine

## 2014-01-07 ENCOUNTER — Encounter: Payer: Self-pay | Admitting: Internal Medicine

## 2014-01-07 ENCOUNTER — Ambulatory Visit (INDEPENDENT_AMBULATORY_CARE_PROVIDER_SITE_OTHER): Payer: Medicaid Other | Admitting: Internal Medicine

## 2014-01-07 DIAGNOSIS — R194 Change in bowel habit: Secondary | ICD-10-CM

## 2014-01-07 DIAGNOSIS — R1013 Epigastric pain: Secondary | ICD-10-CM

## 2014-01-07 DIAGNOSIS — K921 Melena: Secondary | ICD-10-CM

## 2014-01-07 MED ORDER — NA SULFATE-K SULFATE-MG SULF 17.5-3.13-1.6 GM/177ML PO SOLN: ORAL | Status: DC

## 2014-01-07 NOTE — Patient Instructions (Signed)
You have been scheduled for an endoscopy and colonoscopy. Please follow the written instructions given to you at your visit today. Please use the prep kit you have been given today. If you use inhalers (even only as needed), please bring them with you on the day of your procedure. Your physician has requested that you go to www.startemmi.com and enter the access code given to you at your visit today. This web site gives a general overview about your procedure. However, you should still follow specific instructions given to you by our office regarding your preparation for the procedure.  Today you have been given a benefiber handout.  Use the benefiber once her diarrhea stops.  Work up to 2 tablespoons daily.   I appreciate the opportunity to care for you.

## 2014-01-07 NOTE — Progress Notes (Signed)
Referred by Dr. Birdie Sons Subjective:    Patient ID: [name], female    DOB: 28-Aug-1965, 48 y.o.   MRN: [id number]  HPI The patient is a 48 year old African-American woman with multiple medical problems and morbid obesity US had chronic intermittent abdominal cramps. She also has intermittent hematochezia. She has a dull constant epigastric pain is worse when she eats as well. Him time she's nauseous. There is some anal deferred. She has alternating loose diarrheal bowel movements and constipation. She is using MiraLAX. She does take oxycodone intermittently and daily nonsteroidal. Allergies  Allergen Reactions  . Apple Other (See Comments)    "mouth itch" - lumps on tongue  . Banana Other (See Comments)    "whelps on tongue"  . Shrimp [Shellfish Allergy] Nausea And Vomiting and Swelling   Outpatient Prescriptions Prior to Visit  Medication Sig Dispense Refill  . albuterol (PROVENTIL HFA;VENTOLIN HFA) 108 (90 BASE) MCG/ACT inhaler Inhale 1 puff into the lungs every 4 (four) hours as needed for wheezing or shortness of breath.    Marland Kitchen atorvastatin (LIPITOR) 40 MG tablet Take 1 tablet (40 mg total) by mouth daily. 30 tablet 3  . budesonide-formoterol (SYMBICORT) 160-4.5 MCG/ACT inhaler Inhale 2 puffs into the lungs 2 (two) times daily as needed (for shortness of breath).    . cetirizine (ZYRTEC) 10 MG tablet Take 1 tablet (10 mg total) by mouth daily. 30 tablet 11  . cloNIDine (CATAPRES - DOSED IN MG/24 HR) 0.3 mg/24hr patch Place 0.3 mg onto the skin once a week. Change on Friday.    Marland Kitchen dexamethasone (DECADRON) 4 MG tablet Take 2 mg by mouth daily as needed (arthritic flares).    Jae Dire Bandages & Supports (SLIP-ON KNEE BRACE) MISC Please evaluate/measure and dispense appropriate braces for severe DJD. Call [phone] with any questions. 2 each 0  . etodolac (LODINE) 400 MG tablet Take 400 mg by mouth 2 (two) times daily.    Marland Kitchen FLUoxetine (PROZAC) 40 MG capsule Take 1 capsule (40 mg  total) by mouth daily. 30 capsule 11  . furosemide (LASIX) 20 MG tablet Take 2 tablets (40 mg total) by mouth 2 (two) times daily. 120 tablet 1  . lisinopril-hydrochlorothiazide (PRINZIDE,ZESTORETIC) 20-12.5 MG per tablet Take 1 tablet by mouth daily. 30 tablet 11  . medroxyPROGESTERone (DEPO-PROVERA) 150 MG/ML injection Inject 150 mg into the muscle every 3 (three) months.     . metFORMIN (GLUCOPHAGE) 500 MG tablet Take 1 tablet (500 mg total) by mouth 2 (two) times daily. 60 tablet 3  . montelukast (SINGULAIR) 10 MG tablet Take 10 mg by mouth at bedtime.    Marland Kitchen omeprazole (PRILOSEC) 40 MG capsule Take 40 mg by mouth daily.    Marland Kitchen oxybutynin (DITROPAN) 5 MG tablet Take 10 mg by mouth daily.     Marland Kitchen oxyCODONE (OXY IR/ROXICODONE) 5 MG immediate release tablet Take 1 tablet (5 mg total) by mouth every 8 (eight) hours as needed for severe pain. 90 tablet 0  . polyethylene glycol (MIRALAX / GLYCOLAX) packet Take 17 g by mouth daily as needed for mild constipation.    . predniSONE (DELTASONE) 10 MG tablet Take 10 mg by mouth daily.      No facility-administered medications prior to visit.   Past Medical History  Diagnosis Date  . Myasthenia gravis 1994    Positive Acetylcholine receptor Ab and single fiber EMG Dr. Clarisa [facility] 1996- Dr. Noreene Filbert 1998, Dr Sharene Skeans 2008 [facility]- Failed prednisone due to weight  gain, stopped imuran/mestinon due to finances- Now with quiescent disease per Dr Sharene Skeans and no flares in many years  . 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 Coralyn Helling- 10/30/2007 ANA positive, ANA titer  negative, RF less than 20  . ALLERGIC RHINITIS     takes Zyrtec daily  . OSA (obstructive sleep apnea)     RDI 10 on PSG 10/09  . Hypoventilation associated with obesity syndrome   . Pelvic inflammatory disease (PID)   . Viral meningitis     history of viral meningitis  . Irritable bladder   . Hypertension     takes Prinizide daily and  Clonidine on Mondays  . Asthma     Albuterol prn;Symbicort daily and Singulair daily  . Shortness of breath     can be sitting as well as exertion  . Pneumonia     hx of;last time about 1-26yrs ago  . Bronchitis     hx of  . Headache(784.0)     couple of times a week  . Dizziness     pt states she gets off balance occasionally  . Arthritis   . Joint pain   . Joint swelling   . Myasthenia gravis   . Chronic back pain   . Osteoarthritis   . Rheumatoid arthritis(714.0)   . Carpal tunnel syndrome     right  . Eczema   . Trigger finger   . GERD (gastroesophageal reflux disease)     takes Omeprazole daily  . Peptic ulcer   . Constipation     Miralax prn  . Hemorrhoids   . Urinary frequency     takes Ditropan daily  . Urinary urgency   . Nocturia   . Anxiety   . Depression     takes Cymbalta daily  . Insomnia   . Lung disease     scleraderma  . Skin irritation     skin itchy  . Diabetes   . Gallstones 2010   Past Surgical History  Procedure Laterality Date  . Cesarean section  09/15/2004  . Bronchoscopy  08/2007  . Thymus resection  08/25/1993  . Laparoscopic cholecystectomy  07/2008    by Dr. Cyndia Bent  . Cortisone injection      receives an injection every 51months  . Esophagogastroduodenoscopy    . Trigger finger release  05/20/2011    Procedure: RELEASE TRIGGER FINGER/A-1 PULLEY;  Surgeon: [name], MD;  Location: [facility] OR;  Service: Orthopedics;  Laterality: Right;  RIGHT TRIGGER THUMB RELEASE AND RIGHT CARPAL TUNNEL RELEASE  . Carpal tunnel release  05/20/2011    Procedure: CARPAL TUNNEL RELEASE;  Surgeon: [name], MD;  Location: [facility] OR;  Service: Orthopedics;  Laterality: Right;   History   Social History  . Marital Status: Married    Spouse Name: N/A    Number of Children: 4  . Years of Education: N/A   Occupational History  . Disabled    Social History Main Topics  . Smoking status: Never Smoker   . Smokeless  tobacco: Never Used  . Alcohol Use: No  . Drug Use: No  . Sexual Activity: Yes    Birth Control/ Protection: Injection   Other Topics Concern  . None   Social History Narrative   Lives in a house with husband (s/p incarceration), daughter, 2 sons, and grandson, unemployed, unemployed, no pets, no tob, no etoh, no exercise, no healthy diet, more than or  equal to 12 yrs education, christian, has motorized scooter that she uses periodically, husband smokes in the house   Family History  Problem Relation Age of Onset  . Hypertension Father   . Sickle cell trait Father   . Stroke Paternal Grandmother   . Sickle cell trait Paternal Grandfather   . Lupus Other     Mother's first cousin  . Anesthesia problems Neg Hx   . Hypotension Neg Hx   . Malignant hyperthermia Neg Hx   . Pseudochol deficiency Neg Hx   . Crohn's disease Paternal Aunt   . Diabetes Father     Borderline  . Diabetes Paternal Grandfather   . Diabetes Maternal Grandmother   . Colon cancer Neg Hx   . Colon polyps Neg Hx   . Heart disease Neg Hx   . Kidney disease Neg Hx   . Esophageal cancer Neg Hx   . Gallbladder disease Neg Hx         Review of Systems Fell into bushes in parking lot - some pinpricks but no major trauma Ambulates with a cane he'll edema. Urinary frequency stress urinary incontinence symptoms. Chronic back pain and muscle cramps. Also has night sweats. Chronic headaches. Anxiety symptoms. All other review of systems negative.    Objective:   Physical Exam General:  Morbidly obeseWell-developed, well-nourished and in no acute distress Eyes:  anicteric. ENT:   Mouth and posterior pharynx free of lesions.  Neck:   supple w/o thyromegaly or mass.  Lungs: Clear to auscultation bilaterally. Heart:  S1S2, no rubs, murmurs, gallops. Abdomen:  soft, non-tender, no hepatosplenomegaly, hernia, or mass and BS+.  Rectal: deferred Lymph:  no cervical or supraclavicular adenopathy. Extremities:   no  edema the joints are not tender in the knees and hands and elbows. Skin   no rash. I see no damage to the skin from her fall into the bushes. There is just slight hyperpigmentation or erythema on hands and knees from where she had to crawl some. Neuro:  A&O x 3.  Psych:  appropriate mood and  Affect.   Data Reviewed: Labs and primary care notes in the EMR. She is not anemic as of September. She recently had a C. difficile, Hemoccults, stool culture all of which were negative. Also Giardia cryptosporidium screening. A normal gastric emptying study in 2010.      Assessment & Plan:  Abdominal pain, epigastric  Hematochezia  Altered bowel habits   Needs endoscopic investigation 1) EGD - epigastric pain on NSAID and PPI 2) colonoscopy to evaluate altered bowel habits and hematochezia 3) The risks and benefits as well as alternatives of endoscopic procedure(s) have been discussed and reviewed. All questions answered. The patient agrees to proceed. 4) benefiber 2 tsb daily   CC: [name], MD

## 2014-01-11 ENCOUNTER — Other Ambulatory Visit: Payer: Self-pay | Admitting: Family Medicine

## 2014-01-21 ENCOUNTER — Encounter (HOSPITAL_COMMUNITY)
Admission: RE | Disposition: A | Discharge status: Home / Self Care | Payer: Self-pay | Source: Ambulatory Visit | Attending: Internal Medicine

## 2014-01-21 ENCOUNTER — Ambulatory Visit (HOSPITAL_COMMUNITY)
Admission: RE | Admit: 2014-01-21 | Discharge: 2014-01-21 | Disposition: A | Discharge status: Home / Self Care | Payer: Medicaid Other | Source: Ambulatory Visit | Attending: Internal Medicine | Admitting: Internal Medicine

## 2014-01-21 ENCOUNTER — Encounter (HOSPITAL_COMMUNITY): Payer: Self-pay

## 2014-01-21 DIAGNOSIS — F329 Major depressive disorder, single episode, unspecified: Secondary | ICD-10-CM | POA: Insufficient documentation

## 2014-01-21 DIAGNOSIS — Z1211 Encounter for screening for malignant neoplasm of colon: Secondary | ICD-10-CM | POA: Diagnosis present

## 2014-01-21 DIAGNOSIS — K921 Melena: Secondary | ICD-10-CM | POA: Insufficient documentation

## 2014-01-21 DIAGNOSIS — K219 Gastro-esophageal reflux disease without esophagitis: Secondary | ICD-10-CM | POA: Diagnosis not present

## 2014-01-21 DIAGNOSIS — K648 Other hemorrhoids: Secondary | ICD-10-CM

## 2014-01-21 DIAGNOSIS — G4700 Insomnia, unspecified: Secondary | ICD-10-CM | POA: Diagnosis not present

## 2014-01-21 DIAGNOSIS — R1013 Epigastric pain: Secondary | ICD-10-CM | POA: Insufficient documentation

## 2014-01-21 DIAGNOSIS — I10 Essential (primary) hypertension: Secondary | ICD-10-CM | POA: Diagnosis not present

## 2014-01-21 DIAGNOSIS — F419 Anxiety disorder, unspecified: Secondary | ICD-10-CM | POA: Diagnosis not present

## 2014-01-21 DIAGNOSIS — K644 Residual hemorrhoidal skin tags: Secondary | ICD-10-CM | POA: Insufficient documentation

## 2014-01-21 DIAGNOSIS — J45909 Unspecified asthma, uncomplicated: Secondary | ICD-10-CM | POA: Diagnosis not present

## 2014-01-21 DIAGNOSIS — E119 Type 2 diabetes mellitus without complications: Secondary | ICD-10-CM | POA: Diagnosis not present

## 2014-01-21 DIAGNOSIS — G4733 Obstructive sleep apnea (adult) (pediatric): Secondary | ICD-10-CM | POA: Diagnosis not present

## 2014-01-21 DIAGNOSIS — M069 Rheumatoid arthritis, unspecified: Secondary | ICD-10-CM | POA: Insufficient documentation

## 2014-01-21 HISTORY — PX: COLONOSCOPY: SHX5424

## 2014-01-21 HISTORY — PX: ESOPHAGOGASTRODUODENOSCOPY: SHX5428

## 2014-01-21 LAB — GLUCOSE, CAPILLARY: Glucose-Capillary: 113 mg/dL — ABNORMAL HIGH (ref 70–99)

## 2014-01-21 SURGERY — EGD (ESOPHAGOGASTRODUODENOSCOPY)
Anesthesia: Moderate Sedation

## 2014-01-21 MED ORDER — FENTANYL CITRATE 0.05 MG/ML IJ SOLN
INTRAMUSCULAR | Status: AC
  Filled 2014-01-21: qty 2

## 2014-01-21 MED ORDER — MIDAZOLAM HCL 5 MG/5ML IJ SOLN
INTRAMUSCULAR | Status: DC | PRN
  Administered 2014-01-21 (×2): 2 mg via INTRAVENOUS
  Administered 2014-01-21: 1 mg via INTRAVENOUS

## 2014-01-21 MED ORDER — BENEFIBER PO POWD: ORAL | Status: DC

## 2014-01-21 MED ORDER — DIPHENHYDRAMINE HCL 50 MG/ML IJ SOLN
INTRAMUSCULAR | Status: DC | PRN
  Administered 2014-01-21: 25 mg via INTRAVENOUS

## 2014-01-21 MED ORDER — DIPHENHYDRAMINE HCL 50 MG/ML IJ SOLN
INTRAMUSCULAR | Status: AC
  Filled 2014-01-21: qty 1

## 2014-01-21 MED ORDER — DICYCLOMINE HCL 20 MG PO TABS: 20.0000 mg | ORAL_TABLET | Freq: Four times a day (QID) | ORAL | Status: DC | PRN

## 2014-01-21 MED ORDER — MIDAZOLAM HCL 10 MG/2ML IJ SOLN
INTRAMUSCULAR | Status: AC
  Filled 2014-01-21: qty 2

## 2014-01-21 MED ORDER — SODIUM CHLORIDE 0.9 % IV SOLN
INTRAVENOUS | Status: DC
  Administered 2014-01-21: 500 mL via INTRAVENOUS

## 2014-01-21 MED ORDER — FENTANYL CITRATE 0.05 MG/ML IJ SOLN
INTRAMUSCULAR | Status: DC | PRN
  Administered 2014-01-21 (×3): 25 ug via INTRAVENOUS

## 2014-01-21 NOTE — Op Note (Signed)
[facility] [address] [place], [address]   COLONOSCOPY PROCEDURE REPORT  PATIENT: [name], [name]  MR#: [id number] BIRTHDATE: [birth year] , 48  yrs. old GENDER: female ENDOSCOPIST: Iva Boop, MD, [facility] PROCEDURE DATE:  01/21/2014 PROCEDURE:   Colonoscopy, diagnostic First Screening Colonoscopy - Avg.  risk and is 50 yrs.  old or older - No.  Prior Negative Screening - Now for repeat screening. N/A  History of Adenoma - Now for follow-up colonoscopy & has been > or = to 3 yrs.  N/A  Polyps Removed Today? No.  Polyps Removed Today? No.  Recommend repeat exam, <10 yrs? Polyps Removed Today? No.  Recommend repeat exam, <10 yrs? No. ASA CLASS:   Class III INDICATIONS:hematochezia. MEDICATIONS: Residual sedation present  DESCRIPTION OF PROCEDURE:   After the risks benefits and alternatives of the procedure were thoroughly explained, informed consent was obtained.  The digital rectal exam revealed no abnormalities of the rectum.   The EC-[id number] ([id number])  endoscope was introduced through the anus and advanced to the cecum, which was identified by both the appendix and ileocecal valve. No adverse events experienced.   The quality of the prep was excellent using Suprep  The instrument was then slowly withdrawn as the colon was fully examined.  COLON FINDINGS: External and internal hemorrhoids were found.   The examination was otherwise normal.  Retroflexed views revealed internal hemorrhoids and Retroflexed views revealed external hemorrhoids. The time to cecum=2 minutes 0 seconds.  Withdrawal time=7 minutes 0 seconds.  The scope was withdrawn and the procedure completed. COMPLICATIONS: There were no immediate complications.  ENDOSCOPIC IMPRESSION: 1.   External and internal hemorrhoids 2.   The examination was otherwise normal  RECOMMENDATIONS: Benefiber 2 tbsp daily Dicylomine 20 mg every 6 hrs as needed I think main issues is IBS - if persistent  hemorrhoid bleeding after trying to regulate bowels can consider hemorrhoid banding  return to PCP See me prn Repeat colonoscopy routine in 10 years  eSigned:  Iva Boop, MD, [facility] 01/21/2014 1:33 PM   cc: The Patient and Dr. [name]

## 2014-01-21 NOTE — Discharge Instructions (Addendum)
The esophagus, stomach and duodenum are all normal. No ulcers or gastritis.  You have hemorrhoids but otherwise the colonoscopy was normal. The hemorrhoids are bleeding.  I think if you regulate your bowels better you will have less bleeding. You also have IBS or Irritable Bowel Syndrome which is what causes your intermittent abdominal pain and cramps as well as constipation and diarrhea.  1) Start Benefiber 2 tablespoons daily 2) Take dicyclomine as needed for stomach cramps. 3) I sent prescriptions for these to [facility]. 4) return to primary care doctor and see me as needed  I appreciate the opportunity to care for you. Iva Boop, MD, FACG   YOU HAD AN ENDOSCOPIC PROCEDURE TODAY: Refer to the procedure report and other information in the discharge instructions given to you for any specific questions about what was found during the examination. If this information does not answer your questions, please call Dr. Marvell Fuller office at [phone] to clarify.   YOU SHOULD EXPECT: Some feelings of bloating in the abdomen. Passage of more gas than usual. Walking can help get rid of the air that was put into your GI tract during the procedure and reduce the bloating. If you had a lower endoscopy (such as a colonoscopy or flexible sigmoidoscopy) you may notice spotting of blood in your stool or on the toilet paper. Some abdominal soreness may be present for a day or two, also.  DIET: Your first meal following the procedure should be a light meal and then it is ok to progress to your normal diet. A half-sandwich or bowl of soup is an example of a good first meal. Heavy or fried foods are harder to digest and may make you feel nauseous or bloated. Drink plenty of fluids but you should avoid alcoholic beverages for 24 hours.   ACTIVITY: Your care partner should take you home directly after the procedure. You should plan to take it easy, moving slowly for the rest of the day. You can resume  normal activity the day after the procedure however YOU SHOULD NOT DRIVE, use power tools, machinery or perform tasks that involve climbing or major physical exertion for 24 hours (because of the sedation medicines used during the test).   SYMPTOMS TO REPORT IMMEDIATELY: A gastroenterologist can be reached at any hour. Please call [phone]  for any of the following symptoms:  Following lower endoscopy (colonoscopy, flexible sigmoidoscopy) Excessive amounts of blood in the stool  Significant tenderness, worsening of abdominal pains  Swelling of the abdomen that is new, acute  Fever of 100 or higher  Following upper endoscopy (EGD, EUS, ERCP, esophageal dilation) Vomiting of blood or coffee ground material  New, significant abdominal pain  New, significant chest pain or pain under the shoulder blades  Painful or persistently difficult swallowing  New shortness of breath  Black, tarry-looking or red, bloody stools  FOLLOW UP:  If any biopsies were taken you will be contacted by phone or by letter within the next 1-3 weeks. Call [phone]  if you have not heard about the biopsies in 3 weeks.  Please also call with any specific questions about appointments or follow up tests.  Irritable Bowel Syndrome Irritable bowel syndrome (IBS) is caused by a disturbance of normal bowel function and is a common digestive disorder. You may also hear this condition called spastic colon, mucous colitis, and irritable colon. There is no cure for IBS. However, symptoms often gradually improve or disappear with a good diet, stress management, and medicine.  This condition usually appears in late adolescence or early adulthood. Women develop it twice as often as men. CAUSES  After food has been digested and absorbed in the small intestine, waste material is moved into the large intestine, or colon. In the colon, water and salts are absorbed from the undigested products coming from the small intestine. The  remaining residue, or fecal material, is held for elimination. Under normal circumstances, gentle, rhythmic contractions of the bowel walls push the fecal material along the colon toward the rectum. In IBS, however, these contractions are irregular and poorly coordinated. The fecal material is either retained too long, resulting in constipation, or expelled too soon, producing diarrhea. SIGNS AND SYMPTOMS  The most common symptom of IBS is abdominal pain. It is often in the lower left side of the abdomen, but it may occur anywhere in the abdomen. The pain comes from spasms of the bowel muscles happening too much and from the buildup of gas and fecal material in the colon. This pain:  Can range from sharp abdominal cramps to a dull, continuous ache.  Often worsens soon after eating.  Is often relieved by having a bowel movement or passing gas. Abdominal pain is usually accompanied by constipation, but it may also produce diarrhea. The diarrhea often occurs right after a meal or upon waking up in the morning. The stools are often soft, watery, and flecked with mucus. Other symptoms of IBS include:  Bloating.  Loss of appetite.  Heartburn.  Backache.  Dull pain in the arms or shoulders.  Nausea.  Burping.  Vomiting.  Gas. IBS may also cause symptoms that are unrelated to the digestive system, such as:  Fatigue.  Headaches.  Anxiety.  Shortness of breath.  Trouble concentrating.  Dizziness. These symptoms tend to come and go. DIAGNOSIS  The symptoms of IBS may seem like symptoms of other, more serious digestive disorders. Your health care provider may want to perform tests to exclude these disorders.  TREATMENT Many medicines are available to help correct bowel function or relieve bowel spasms and abdominal pain. Among the medicines available are:  Laxatives for severe constipation and to help restore normal bowel habits.  Specific antidiarrheal medicines to treat severe  or lasting diarrhea.  Antispasmodic agents to relieve intestinal cramps. Your health care provider may also decide to treat you with a mild tranquilizer or sedative during unusually stressful periods in your life. Your health care provider may also prescribe antidepressant medicine. The use of this medicine has been shown to reduce pain and other symptoms of IBS. Remember that if any medicine is prescribed for you, you should take it exactly as directed. Make sure your health care provider knows how well it worked for you. HOME CARE INSTRUCTIONS   Take all medicines as directed by your health care provider.  Avoid foods that are high in fat or oils, such as heavy cream, butter, frankfurters, sausage, and other fatty meats.  Avoid foods that make you go to the bathroom, such as fruit, fruit juice, and dairy products.  Cut out carbonated drinks, chewing gum, and "gassy" foods such as beans and cabbage. This may help relieve bloating and burping.  Eat foods with bran, and drink plenty of liquids with the bran foods. This helps relieve constipation.  Keep track of what foods seem to bring on your symptoms.  Avoid emotionally charged situations or circumstances that produce anxiety.  Start or continue exercising.  Get plenty of rest and sleep. Document Released: 01/11/2005 Document  Revised: 01/16/2013 Document Reviewed: 09/01/2007 [facility] Patient Information 2015 [place], [place]. This information is not intended to replace advice given to you by your health care provider. Make sure you discuss any questions you have with your health care provider.

## 2014-01-21 NOTE — Op Note (Signed)
[facility] [address] [place], [address]   ENDOSCOPY PROCEDURE REPORT  PATIENT: Sara, [name]  MR#: [id number] BIRTHDATE: 24-Mar-1965 , 48  yrs. old GENDER: female ENDOSCOPIST: Iva Boop, MD, [facility] PROCEDURE DATE:  01/21/2014 PROCEDURE: ASA CLASS:     Class III INDICATIONS:  epigastric pain. MEDICATIONS: Benadryl 25 mg IV, Fentanyl 75 mcg IV, and Versed 5 mg IV TOPICAL ANESTHETIC: Cetacaine Spray  DESCRIPTION OF PROCEDURE: After the risks benefits and alternatives of the procedure were thoroughly explained, informed consent was obtained.  The Pentax Gastroscope [id number] endoscope was introduced through the mouth and advanced to the second portion of the duodenum , Without limitations.  The instrument was slowly withdrawn as the mucosa was fully examined.      EXAM: The esophagus and gastroesophageal junction were completely normal in appearance.  The stomach was entered and closely examined.The antrum, angularis, and lesser curvature were well visualized, including a retroflexed view of the cardia and fundus. The stomach wall was normally distensable.  The scope passed easily through the pylorus into the duodenum.  Retroflexed views revealed no abnormalities.     The scope was then withdrawn from the patient and the procedure completed.  COMPLICATIONS: There were no immediate complications.  ENDOSCOPIC IMPRESSION: Normal appearing esophagus and GE junction, the stomach was well visualized and normal in appearance, normal appearing duodenum  RECOMMENDATIONS: Proceed with a Colonoscopy.   eSigned:  Iva Boop, MD, [facility] 01/21/2014 1:25 PM    CC:The Patient and Dr. [name]

## 2014-01-21 NOTE — H&P (Signed)
Expand All Collapse All    Referred by Dr. Birdie Sons Subjective:    Patient ID: [name], female DOB: February 06, 1965, 48 y.o. MRN: [id number]  HPI The patient is a 48 year old African-American woman with multiple medical problems and morbid obesity US had chronic intermittent abdominal cramps. She also has intermittent hematochezia. She has a dull constant epigastric pain is worse when she eats as well. Him time she's nauseous. There is some anal deferred. She has alternating loose diarrheal bowel movements and constipation. She is using MiraLAX. She does take oxycodone intermittently and daily nonsteroidal. Allergies  Allergen Reactions  . Apple Other (See Comments)    "mouth itch" - lumps on tongue  . Banana Other (See Comments)    "whelps on tongue"  . Shrimp [Shellfish Allergy] Nausea And Vomiting and Swelling   Outpatient Prescriptions Prior to Visit  Medication Sig Dispense Refill  . albuterol (PROVENTIL HFA;VENTOLIN HFA) 108 (90 BASE) MCG/ACT inhaler Inhale 1 puff into the lungs every 4 (four) hours as needed for wheezing or shortness of breath.    Marland Kitchen atorvastatin (LIPITOR) 40 MG tablet Take 1 tablet (40 mg total) by mouth daily. 30 tablet 3  . budesonide-formoterol (SYMBICORT) 160-4.5 MCG/ACT inhaler Inhale 2 puffs into the lungs 2 (two) times daily as needed (for shortness of breath).    . cetirizine (ZYRTEC) 10 MG tablet Take 1 tablet (10 mg total) by mouth daily. 30 tablet 11  . cloNIDine (CATAPRES - DOSED IN MG/24 HR) 0.3 mg/24hr patch Place 0.3 mg onto the skin once a week. Change on Friday.    Marland Kitchen dexamethasone (DECADRON) 4 MG tablet Take 2 mg by mouth daily as needed (arthritic flares).    Jae Dire Bandages & Supports (SLIP-ON KNEE BRACE) MISC Please evaluate/measure and dispense appropriate braces for severe DJD. Call [phone] with any questions. 2 each 0  . etodolac (LODINE) 400 MG tablet Take 400 mg by  mouth 2 (two) times daily.    Marland Kitchen FLUoxetine (PROZAC) 40 MG capsule Take 1 capsule (40 mg total) by mouth daily. 30 capsule 11  . furosemide (LASIX) 20 MG tablet Take 2 tablets (40 mg total) by mouth 2 (two) times daily. 120 tablet 1  . lisinopril-hydrochlorothiazide (PRINZIDE,ZESTORETIC) 20-12.5 MG per tablet Take 1 tablet by mouth daily. 30 tablet 11  . medroxyPROGESTERone (DEPO-PROVERA) 150 MG/ML injection Inject 150 mg into the muscle every 3 (three) months.     . metFORMIN (GLUCOPHAGE) 500 MG tablet Take 1 tablet (500 mg total) by mouth 2 (two) times daily. 60 tablet 3  . montelukast (SINGULAIR) 10 MG tablet Take 10 mg by mouth at bedtime.    Marland Kitchen omeprazole (PRILOSEC) 40 MG capsule Take 40 mg by mouth daily.    Marland Kitchen oxybutynin (DITROPAN) 5 MG tablet Take 10 mg by mouth daily.     Marland Kitchen oxyCODONE (OXY IR/ROXICODONE) 5 MG immediate release tablet Take 1 tablet (5 mg total) by mouth every 8 (eight) hours as needed for severe pain. 90 tablet 0  . polyethylene glycol (MIRALAX / GLYCOLAX) packet Take 17 g by mouth daily as needed for mild constipation.    . predniSONE (DELTASONE) 10 MG tablet Take 10 mg by mouth daily.      No facility-administered medications prior to visit.   Past Medical History  Diagnosis Date  . Myasthenia gravis 1994    Positive Acetylcholine receptor Ab and single fiber EMG Dr. Clarisa [facility] 1996- Dr. Noreene Filbert 1998, Dr Sharene Skeans 2008 [facility]- Failed prednisone  due to weight gain, stopped imuran/mestinon due to finances- Now with quiescent disease per Dr Sharene Skeans and no flares in many years  . 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 Coralyn Helling- 10/30/2007 ANA positive, ANA titer negative, RF less than 20  . ALLERGIC RHINITIS     takes Zyrtec daily  . OSA (obstructive sleep apnea)     RDI 10 on PSG 10/09  . Hypoventilation associated with  obesity syndrome   . Pelvic inflammatory disease (PID)   . Viral meningitis     history of viral meningitis  . Irritable bladder   . Hypertension     takes Prinizide daily and Clonidine on Mondays  . Asthma     Albuterol prn;Symbicort daily and Singulair daily  . Shortness of breath     can be sitting as well as exertion  . Pneumonia     hx of;last time about 1-35yrs ago  . Bronchitis     hx of  . Headache(784.0)     couple of times a week  . Dizziness     pt states she gets off balance occasionally  . Arthritis   . Joint pain   . Joint swelling   . Myasthenia gravis   . Chronic back pain   . Osteoarthritis   . Rheumatoid arthritis(714.0)   . Carpal tunnel syndrome     right  . Eczema   . Trigger finger   . GERD (gastroesophageal reflux disease)     takes Omeprazole daily  . Peptic ulcer   . Constipation     Miralax prn  . Hemorrhoids   . Urinary frequency     takes Ditropan daily  . Urinary urgency   . Nocturia   . Anxiety   . Depression     takes Cymbalta daily  . Insomnia   . Lung disease     scleraderma  . Skin irritation     skin itchy  . Diabetes   . Gallstones 2010   Past Surgical History  Procedure Laterality Date  . Cesarean section  09/15/2004  . Bronchoscopy  08/2007  . Thymus resection  08/25/1993  . Laparoscopic cholecystectomy  07/2008    by Dr. Cyndia Bent  . Cortisone injection      receives an injection every 57months  . Esophagogastroduodenoscopy    . Trigger finger release  05/20/2011    Procedure: RELEASE TRIGGER FINGER/A-1 PULLEY; Surgeon: [name], MD; Location: [facility] OR; Service: Orthopedics; Laterality: Right; RIGHT TRIGGER THUMB RELEASE AND RIGHT CARPAL TUNNEL RELEASE  . Carpal tunnel release  05/20/2011    Procedure:  CARPAL TUNNEL RELEASE; Surgeon: [name], MD; Location: [facility] OR; Service: Orthopedics; Laterality: Right;   History   Social History  . Marital Status: Married    Spouse Name: N/A    Number of Children: 4  . Years of Education: N/A   Occupational History  . Disabled    Social History Main Topics  . Smoking status: Never Smoker   . Smokeless tobacco: Never Used  . Alcohol Use: No  . Drug Use: No  . Sexual Activity: Yes    Birth Control/ Protection: Injection   Other Topics Concern  . None   Social History Narrative   Lives in a house with husband (s/p incarceration), daughter, 2 sons, and grandson, unemployed, unemployed, no pets, no tob, no etoh, no exercise, no healthy diet, more than or equal to 12 yrs education, christian, has motorized  scooter that she uses periodically, husband smokes in the house   Family History  Problem Relation Age of Onset  . Hypertension Father   . Sickle cell trait Father   . Stroke Paternal Grandmother   . Sickle cell trait Paternal Grandfather   . Lupus Other     Mother's first cousin  . Anesthesia problems Neg Hx   . Hypotension Neg Hx   . Malignant hyperthermia Neg Hx   . Pseudochol deficiency Neg Hx   . Crohn's disease Paternal Aunt   . Diabetes Father     Borderline  . Diabetes Paternal Grandfather   . Diabetes Maternal Grandmother   . Colon cancer Neg Hx   . Colon polyps Neg Hx   . Heart disease Neg Hx   . Kidney disease Neg Hx   . Esophageal cancer Neg Hx   . Gallbladder disease Neg Hx        Review of Systems Fell into bushes in parking lot - some pinpricks but no major trauma Ambulates with a cane he'll edema. Urinary frequency stress urinary incontinence symptoms. Chronic back pain and muscle cramps. Also has night sweats. Chronic headaches. Anxiety symptoms.  All other review of systems negative.    Objective:   Physical Exam General: Morbidly obeseWell-developed, well-nourished and in no acute distress Eyes:anicteric. ENT: Mouth and posterior pharynx free of lesions.  Neck: supple w/o thyromegaly or mass.  Lungs:Clear to auscultation bilaterally. Heart: S1S2, no rubs, murmurs, gallops. Abdomen: soft, non-tender, no hepatosplenomegaly, hernia, or mass and BS+.  Rectal:deferred Lymph: no cervical or supraclavicular adenopathy. Extremities: no edema the joints are not tender in the knees and hands and elbows. Skin no rash. I see no damage to the skin from her fall into the bushes. There is just slight hyperpigmentation or erythema on hands and knees from where she had to crawl some. Neuro: A&O x 3.  Psych: appropriate mood and Affect.   Data Reviewed: Labs and primary care notes in the EMR. She is not anemic as of September. She recently had a C. difficile, Hemoccults, stool culture all of which were negative. Also Giardia cryptosporidium screening. A normal gastric emptying study in 2010.      Assessment & Plan:  Abdominal pain, epigastric  Hematochezia  Altered bowel habits   Needs endoscopic investigation 1) EGD - epigastric pain on NSAID and PPI 2) colonoscopy to evaluate altered bowel habits and hematochezia 3) The risks and benefits as well as alternatives of endoscopic procedure(s) have been discussed and reviewed. All questions answered. The patient agrees to proceed. 4) benefiber 2 tsb daily   CC: [name], MD   01/21/2014  Patient seen and examined and no significant changes to H and P in interim. Iva Boop, MD, [facility] [phone] (pager) 01/21/2014 12:31 PM

## 2014-01-22 ENCOUNTER — Encounter (HOSPITAL_COMMUNITY): Payer: Self-pay | Admitting: Internal Medicine

## 2014-01-22 ENCOUNTER — Ambulatory Visit (HOSPITAL_BASED_OUTPATIENT_CLINIC_OR_DEPARTMENT_OTHER): Payer: Medicaid Other | Attending: Family Medicine | Admitting: Radiology

## 2014-01-22 VITALS — Ht 64.0 in | Wt 376.0 lb

## 2014-01-22 DIAGNOSIS — G4733 Obstructive sleep apnea (adult) (pediatric): Secondary | ICD-10-CM

## 2014-01-22 DIAGNOSIS — R0683 Snoring: Secondary | ICD-10-CM | POA: Diagnosis not present

## 2014-01-22 DIAGNOSIS — G4710 Hypersomnia, unspecified: Secondary | ICD-10-CM | POA: Diagnosis present

## 2014-01-26 ENCOUNTER — Ambulatory Visit (HOSPITAL_BASED_OUTPATIENT_CLINIC_OR_DEPARTMENT_OTHER): Payer: Medicaid Other | Admitting: Internal Medicine

## 2014-01-26 DIAGNOSIS — G4733 Obstructive sleep apnea (adult) (pediatric): Secondary | ICD-10-CM

## 2014-01-26 NOTE — Sleep Study (Signed)
   NAME: [name] DATE OF BIRTH:  1965-12-27 MEDICAL RECORD NUMBER [id number]  LOCATION: [facility]  PHYSICIAN: Alyha Marines D  DATE OF STUDY: 01/22/2014  SLEEP STUDY TYPE: Nocturnal Polysomnogram               REFERRING PHYSICIAN: Glori Luis, MD  INDICATION FOR STUDY: Hypersomnia with sleep apnea  EPWORTH SLEEPINESS SCORE:   12/24 HEIGHT: 5\' 4"  (162.6 cm)  WEIGHT: (!) 376 lb (170.552 kg)    Body mass index is 64.51 kg/(m^2).  NECK SIZE: 15.5 in.  MEDICATIONS: Charted for review  SLEEP ARCHITECTURE: Total sleep time 314 minutes with sleep efficiency 77.8%. Stage I was 9.4%, stage II 79.1%, stage III absent, REM 11.5% of total sleep time. Sleep latency 49 minutes, REM latency 126.5 minutes, awake after sleep onset 39.5 minutes, arousal index 13.4, bedtime medication: None  RESPIRATORY DATA: Apnea hypopnea index (AHI) 5.2 per hour. 27 total events scored including one obstructive apnea and 26 hypopneas. Events were not positional. REM AHI 36.7 per hour. There were not enough events to meet protocol requirements for split CPAP titration on this study  OXYGEN DATA: Mild to moderate snoring with oxygen desaturation to a nadir of 89% and mean saturation 96.7% on room air  CARDIAC DATA: Sinus rhythm with PACs  MOVEMENT/PARASOMNIA: No significant movement disturbance, bathroom 1  IMPRESSION/ RECOMMENDATION:   1) Minimal obstructive sleep apnea/hypopnea syndrome, AHI 5.2 per hour with non-positional events. The normal range for adults is an AHI from 0-5 events per hour. Mild to moderate snoring with oxygen desaturation to a nadir of 89% and mean saturation 96.7% on room air.   Diplomate, American Board of Sleep Medicine  ELECTRONICALLY SIGNED ON:  01/26/2014, 10:29 AM [facility] PH: [phone]   FX: [phone] ACCREDITED BY THE AMERICAN ACADEMY OF SLEEP MEDICINE

## 2014-01-28 ENCOUNTER — Other Ambulatory Visit: Payer: Self-pay | Admitting: *Deleted

## 2014-01-28 MED ORDER — OMEPRAZOLE 40 MG PO CPDR: 40.0000 mg | DELAYED_RELEASE_CAPSULE | Freq: Every day | ORAL | Status: DC

## 2014-01-28 MED ORDER — FLUOXETINE HCL 40 MG PO CAPS: 40.0000 mg | ORAL_CAPSULE | Freq: Every day | ORAL | Status: DC

## 2014-01-28 MED ORDER — PREDNISONE 10 MG PO TABS: 10.0000 mg | ORAL_TABLET | Freq: Every day | ORAL | Status: DC

## 2014-01-28 NOTE — Telephone Encounter (Signed)
Refills given. Please inform the patient that she needs to follow-up in clinic some time in the next month. Thanks.

## 2014-01-29 MED ORDER — OXYCODONE HCL 5 MG PO TABS: 5.0000 mg | ORAL_TABLET | Freq: Three times a day (TID) | ORAL | Status: DC | PRN

## 2014-01-29 NOTE — Telephone Encounter (Signed)
Pt informed of refills.  Appt 02/11/2014 at 4 PM with PCP.  Pt also requested a refill on pain medication until her appt.  She stated she is completely out; took last pill 01/28/2014.  Clovis Pu, RN

## 2014-01-29 NOTE — Telephone Encounter (Signed)
Refill given for 2 weeks. Placed at front. Please inform the patient. Thanks.

## 2014-01-29 NOTE — Telephone Encounter (Signed)
LMOVM for pt to return call .[name], Jessica Dawn  

## 2014-01-30 NOTE — Telephone Encounter (Signed)
Pt is aware of Rx for 2 weeks and advised to keep appt with PCP.  Clovis Pu, RN

## 2014-01-30 NOTE — Telephone Encounter (Signed)
Pt is aware that rx is ready for pick and the others were sent to pharmacy. Amia Rynders,CMA

## 2014-02-11 ENCOUNTER — Ambulatory Visit (INDEPENDENT_AMBULATORY_CARE_PROVIDER_SITE_OTHER): Payer: Medicaid Other | Admitting: Family Medicine

## 2014-02-11 ENCOUNTER — Encounter: Payer: Self-pay | Admitting: Family Medicine

## 2014-02-11 ENCOUNTER — Other Ambulatory Visit: Payer: Self-pay | Admitting: Family Medicine

## 2014-02-11 VITALS — BP 164/84 | HR 109 | Temp 98.5°F | Ht 64.0 in | Wt 382.9 lb

## 2014-02-11 DIAGNOSIS — Z7189 Other specified counseling: Secondary | ICD-10-CM

## 2014-02-11 DIAGNOSIS — G8929 Other chronic pain: Secondary | ICD-10-CM

## 2014-02-11 DIAGNOSIS — E119 Type 2 diabetes mellitus without complications: Secondary | ICD-10-CM

## 2014-02-11 DIAGNOSIS — M170 Bilateral primary osteoarthritis of knee: Secondary | ICD-10-CM

## 2014-02-11 LAB — POCT GLYCOSYLATED HEMOGLOBIN (HGB A1C): Hemoglobin A1C: 7.4

## 2014-02-11 MED ORDER — OXYCODONE HCL 5 MG PO TABS: 5.0000 mg | ORAL_TABLET | Freq: Three times a day (TID) | ORAL | Status: DC | PRN

## 2014-02-11 MED ORDER — OMEPRAZOLE 40 MG PO CPDR: 40.0000 mg | DELAYED_RELEASE_CAPSULE | Freq: Two times a day (BID) | ORAL | Status: DC

## 2014-02-11 NOTE — Progress Notes (Signed)
Patient ID: [name], female   DOB: [birth year], 49 y.o.   MRN: [id number]  Sara Alar, MD Phone: [phone]  [name] is a 49 y.o. female who presents today for f/u.  DIABETES Disease Monitoring: Blood Sugar ranges-not checking Polyuria/phagia/dipsia- some polydipsia       Medications: Compliance- taking metformin Hypoglycemic symptoms- no  Osteoarthritis: bilateral knees. Followed by ortho. Gets pain medications at [facility]. Notes this has gotten worse since cold weather came on, she discussed this with her orthopedist at her visit last week. Notes warm weather improves her pain. Taking oxycodone TID prn, though now taking it TID. Notes recent muscle rub helped with pain.    Patient is a nonsmoker.    ROS: Per HPI   Physical Exam Filed Vitals:   02/11/14 1630  BP: 164/84  Pulse: 109  Temp: 98.5 F (36.9 C)    Gen: Well NAD HEENT: PERRL,  MMM Lungs: CTABL Nl WOB Heart: RRR  MSK: knee exam difficult with body habitus, though there is some mild tenderness with palpation of both knees bilateral joint lines, there is no appreciable swelling, there is no ligamentous laxity Exts: Non edematous BL  LE, warm and well perfused.    Assessment/Plan: Please see individual problem list.  Sara Alar, MD [facility] PGY-3

## 2014-02-11 NOTE — Assessment & Plan Note (Signed)
Refill of oxycodone given for 3 months. Pain contract filled out. UDS obtained. Will continue to follow.

## 2014-02-11 NOTE — Assessment & Plan Note (Signed)
A1c stable at 7.4 on current metformin dosing. Will continue current medication regimen. F/u 3 months.

## 2014-02-11 NOTE — Patient Instructions (Signed)
Nice to see you. I have refilled your medications. We will check your A1c and a urine drug screen and will call with the results.

## 2014-02-12 ENCOUNTER — Other Ambulatory Visit: Payer: Self-pay | Admitting: *Deleted

## 2014-02-12 LAB — DRUG SCR UR, PAIN MGMT, REFLEX CONF
Amphetamine Screen, Ur: NEGATIVE
Barbiturate Quant, Ur: NEGATIVE
Benzodiazepines.: NEGATIVE
Cocaine Metabolites: NEGATIVE
Creatinine,U: 581.87 mg/dL
METHADONE: NEGATIVE
Marijuana Metabolite: NEGATIVE
PHENCYCLIDINE (PCP): NEGATIVE
PROPOXYPHENE: NEGATIVE

## 2014-02-13 MED ORDER — MONTELUKAST SODIUM 10 MG PO TABS: 10.0000 mg | ORAL_TABLET | Freq: Every day | ORAL | Status: DC

## 2014-02-13 MED ORDER — LISINOPRIL-HYDROCHLOROTHIAZIDE 20-12.5 MG PO TABS: 1.0000 | ORAL_TABLET | Freq: Every day | ORAL | Status: DC

## 2014-02-16 LAB — OPIATES/OPIOIDS (LC/MS-MS)
Codeine Urine: NEGATIVE ng/mL (ref <50)
Hydrocodone: NEGATIVE ng/mL (ref <50)
Hydromorphone: NEGATIVE ng/mL (ref <50)
Morphine Urine: NEGATIVE ng/mL (ref <50)
NOROXYCODONE, UR: 9588 ng/mL — AB (ref <50)
Norhydrocodone, Ur: NEGATIVE ng/mL (ref <50)
OXYCODONE, UR: 9287 ng/mL — AB (ref <50)
OXYMORPHONE, URINE: 542 ng/mL — AB (ref <50)

## 2014-03-25 ENCOUNTER — Other Ambulatory Visit: Payer: Self-pay | Admitting: *Deleted

## 2014-03-26 ENCOUNTER — Other Ambulatory Visit: Payer: Self-pay | Admitting: *Deleted

## 2014-03-26 MED ORDER — PREDNISONE 10 MG PO TABS: 10.0000 mg | ORAL_TABLET | Freq: Every day | ORAL | Status: DC

## 2014-03-26 MED ORDER — MONTELUKAST SODIUM 10 MG PO TABS: 10.0000 mg | ORAL_TABLET | Freq: Every day | ORAL | Status: DC

## 2014-03-26 MED ORDER — LISINOPRIL-HYDROCHLOROTHIAZIDE 20-12.5 MG PO TABS: 1.0000 | ORAL_TABLET | Freq: Every day | ORAL | Status: DC

## 2014-03-26 MED ORDER — BUDESONIDE-FORMOTEROL FUMARATE 160-4.5 MCG/ACT IN AERO: 2.0000 | INHALATION_SPRAY | Freq: Two times a day (BID) | RESPIRATORY_TRACT | Status: DC | PRN

## 2014-03-26 MED ORDER — CETIRIZINE HCL 10 MG PO TABS: 10.0000 mg | ORAL_TABLET | Freq: Every day | ORAL | Status: DC

## 2014-03-30 ENCOUNTER — Other Ambulatory Visit: Payer: Self-pay | Admitting: Family Medicine

## 2014-04-13 ENCOUNTER — Other Ambulatory Visit: Payer: Self-pay | Admitting: Family Medicine

## 2014-05-03 ENCOUNTER — Ambulatory Visit: Payer: Self-pay

## 2014-05-03 ENCOUNTER — Encounter: Payer: Self-pay | Admitting: *Deleted

## 2014-05-03 ENCOUNTER — Encounter: Payer: Self-pay | Admitting: Family Medicine

## 2014-05-03 ENCOUNTER — Ambulatory Visit (INDEPENDENT_AMBULATORY_CARE_PROVIDER_SITE_OTHER): Payer: Medicaid Other | Admitting: Family Medicine

## 2014-05-03 VITALS — BP 204/121 | HR 99 | Temp 98.8°F | Ht 64.0 in | Wt 372.7 lb

## 2014-05-03 DIAGNOSIS — Z3042 Encounter for surveillance of injectable contraceptive: Secondary | ICD-10-CM | POA: Diagnosis not present

## 2014-05-03 LAB — POCT URINE PREGNANCY: PREG TEST UR: NEGATIVE

## 2014-05-03 MED ORDER — MEDROXYPROGESTERONE ACETATE 150 MG/ML IM SUSP
150.0000 mg | Freq: Once | INTRAMUSCULAR | Status: AC
Start: 2014-05-03 — End: 2014-05-03
  Administered 2014-05-03: 150 mg via INTRAMUSCULAR

## 2014-05-03 MED ORDER — CLONIDINE HCL 0.1 MG PO TABS: 0.1000 mg | ORAL_TABLET | Freq: Three times a day (TID) | ORAL | Status: DC

## 2014-05-03 NOTE — Progress Notes (Signed)
   Subjective:    Patient ID: [name], female    DOB: 11/16/1965, 49 y.o.   MRN: [id number]  HPI  HTN: takes lisinopril-HCTZ qHS and therefore has not had yet. - has clonidine patch on currently, reports fell off Wednesday and was off until this morning.  Frequently falls off when she goes to pool and as she only gets 4/month she does not put one back on until the following Friday. - has HA in AM, none currently, notes when off clonidine patch  Review of Systems  Constitutional: Negative for chills and diaphoresis.  Respiratory: Negative for cough and shortness of breath.   Cardiovascular: Negative for leg swelling.  Gastrointestinal: Negative for abdominal pain, diarrhea, constipation and anal bleeding.  Endocrine: Negative for cold intolerance and heat intolerance.  Genitourinary: Negative for dysuria, urgency, decreased urine volume and difficulty urinating.  Neurological: Positive for headaches.       Objective:   Physical Exam  Constitutional: She appears well-developed and well-nourished. No distress.  HENT:  Mouth/Throat: Mucous membranes are moist. Pharynx is normal.  Eyes: Conjunctivae and EOM are normal.  Neck: No adenopathy.  Cardiovascular: Normal rate and S2 normal.   Pulmonary/Chest: Effort normal. No respiratory distress.  Abdominal: She exhibits no distension. There is no tenderness.  Musculoskeletal: Normal range of motion.  Neurological: She is alert. A cranial nerve deficit is present. Coordination normal.  Skin: Skin is warm. No rash noted. She is not diaphoretic. No pallor.   BP 204/121 mmHg  Pulse 99  Temp(Src) 98.8 F (37.1 C) (Oral)  Ht 5\' 4"  (1.626 m)  Wt 372 lb 11.2 oz (169.056 kg)  BMI 63.94 kg/m2 REPEAT BP 125/67 ==> likely falsely elevated 2/2 cuff size, repeat done with different cuff     Assessment & Plan:  49 yo female here for contraception with HTN  - HTN: likely rebound HTN 2/2 abrupt cessation of clonidine. ==> clonidine 0.1  mg prn TID for when patch falls off, advised of risk of rebound HTN, ER warnings discussed, continue lisinopril/HCTZ  - contraception: depo today, pregnancy test negative => advised of longterm risks of depo, declines other forms of contraception.  Advised to take vitamin D daily for osteoporosis prevention  Daley Mooradian ROCIO, MD 4:09 PM

## 2014-05-03 NOTE — Addendum Note (Signed)
Addended by: Lamonte Sakai, APRIL D on: 05/03/2014 04:40 PM   Modules accepted: Orders

## 2014-05-03 NOTE — Progress Notes (Signed)
Pt is here today to restart her depo.  It has been ~6 months since her last depo.  Dr. De Nurse paged and he would like her seen before depo restarted.   Pt is agreeable and appt made for Dr. Loreta Ave this afternoon. Rena Sweeden, Maryjo [place]

## 2014-05-07 ENCOUNTER — Other Ambulatory Visit: Payer: Self-pay | Admitting: Family Medicine

## 2014-05-08 ENCOUNTER — Other Ambulatory Visit: Payer: Self-pay | Admitting: *Deleted

## 2014-05-08 MED ORDER — CLONIDINE HCL 0.3 MG/24HR TD PTWK: 0.3000 mg | MEDICATED_PATCH | TRANSDERMAL | Status: DC

## 2014-05-08 MED ORDER — OXYBUTYNIN CHLORIDE 5 MG PO TABS: 5.0000 mg | ORAL_TABLET | Freq: Two times a day (BID) | ORAL | Status: DC

## 2014-05-30 ENCOUNTER — Encounter: Payer: Self-pay | Admitting: Family Medicine

## 2014-05-30 ENCOUNTER — Ambulatory Visit (INDEPENDENT_AMBULATORY_CARE_PROVIDER_SITE_OTHER): Payer: Medicaid Other | Admitting: Family Medicine

## 2014-05-30 VITALS — BP 140/74 | HR 77 | Temp 98.6°F | Ht 64.0 in | Wt 377.0 lb

## 2014-05-30 DIAGNOSIS — E119 Type 2 diabetes mellitus without complications: Secondary | ICD-10-CM | POA: Diagnosis not present

## 2014-05-30 DIAGNOSIS — R0781 Pleurodynia: Secondary | ICD-10-CM | POA: Diagnosis not present

## 2014-05-30 DIAGNOSIS — G4730 Sleep apnea, unspecified: Secondary | ICD-10-CM | POA: Diagnosis not present

## 2014-05-30 DIAGNOSIS — G4710 Hypersomnia, unspecified: Secondary | ICD-10-CM | POA: Diagnosis not present

## 2014-05-30 DIAGNOSIS — M174 Other bilateral secondary osteoarthritis of knee: Secondary | ICD-10-CM | POA: Diagnosis not present

## 2014-05-30 DIAGNOSIS — G4733 Obstructive sleep apnea (adult) (pediatric): Secondary | ICD-10-CM

## 2014-05-30 DIAGNOSIS — R0789 Other chest pain: Secondary | ICD-10-CM

## 2014-05-30 LAB — POCT URINALYSIS DIPSTICK
GLUCOSE UA: NEGATIVE
Leukocytes, UA: NEGATIVE
NITRITE UA: NEGATIVE
Protein, UA: NEGATIVE
RBC UA: NEGATIVE
Spec Grav, UA: 1.02
UROBILINOGEN UA: 0.2
pH, UA: 7

## 2014-05-30 LAB — POCT GLYCOSYLATED HEMOGLOBIN (HGB A1C): HEMOGLOBIN A1C: 7.5

## 2014-05-30 MED ORDER — OXYCODONE HCL 5 MG PO TABS: 5.0000 mg | ORAL_TABLET | Freq: Three times a day (TID) | ORAL | Status: DC | PRN

## 2014-05-30 NOTE — Patient Instructions (Signed)
Nice to see you. We will get a repeat sleep study.  Please go get the chest x-ray to look at your ribs.  If you develop shortness of breath, chest pain, swelling, or difficulty breathing when laying flat please seek medical attention.

## 2014-05-31 ENCOUNTER — Other Ambulatory Visit: Payer: Self-pay | Admitting: Family Medicine

## 2014-05-31 ENCOUNTER — Ambulatory Visit
Admission: RE | Admit: 2014-05-31 | Discharge: 2014-05-31 | Disposition: A | Discharge status: Home / Self Care | Payer: Medicaid Other | Source: Ambulatory Visit | Attending: Family Medicine | Admitting: Family Medicine

## 2014-05-31 DIAGNOSIS — R0781 Pleurodynia: Secondary | ICD-10-CM

## 2014-06-02 DIAGNOSIS — R0781 Pleurodynia: Secondary | ICD-10-CM | POA: Insufficient documentation

## 2014-06-02 NOTE — Assessment & Plan Note (Addendum)
Patient with history of OSA and symptoms could be consistent with OSA/OHS. Doubt cardiac cause with normal systolic function and cardiac motion on echo in September, also with no orthopnea or signs of volume overload. Normal O2 sat is reassuring today. Given recent worsening in symptoms will repeat sleep study. Patient would probably benefit from CPAP, though will await results of sleep study. She needs to lose weight and I advised her of this. Given checking rib films, will check CXR to evaluate for other pulmonary causes. Given return precautions.

## 2014-06-02 NOTE — Assessment & Plan Note (Signed)
Likely strain related to previous episodes of coughing. No palpable abnormalities other than tenderness. Normal lung exam. Not pleuritic. Normal O2 sat. Will check rib films and CXR to evaluate for cause. Given return precautions.

## 2014-06-02 NOTE — Progress Notes (Addendum)
Patient ID: [name], female   DOB: 1965-02-08, 49 y.o.   MRN: [id number]  [name], MD Phone: [phone]  [name] is a 49 y.o. female who presents today for f/u.  OSA/OHS: patient notes that she has had increased in episode where she wakes up short of breath. She notes she has a history of OSA and was previously on a CPAP, though does not have this any more. Only has issues while asleep. Has been told she stops breathing while sleeping. Is not well rested in the morning. Is tired during the day. Denies orthopnea, chest pain, and edema. Had echo September of last year with normal EF and normal wall function and normal PA pressures. Had sleep study in December 2015 that did not meet the criteria for CPAP titration with 5.2 apneic episodes per hour.   DJD bilateral knees: reports stable discomfort in bilateral knees. R>L pain. Takes pain medication 2-3x/day every day. Feels this helps her pain.  Left rib pain: notes discomfort with resting her left arm on her side. No pain unless there is pressure placed on the left mid and lower midaxillary ribs. No abdominal pain. Has been present for 2 months. No recent cough, though notes 2-3 months ago she did have a cold with significant coughing. No specific injury. Is an aching pain. No pain with deep breath.  PMH: nonsmoker.   ROS: Per HPI   Physical Exam Filed Vitals:   05/30/14 1635  BP: 140/74  Pulse: 77  Temp: 98.6 F (37 C)    Gen: Well NAD HEENT: PERRL,  MMM Lungs: CTABL Nl WOB Heart: RRR, no murmur appreciated  Abd: soft, NT, ND MSK: left midaxillary mid and lower ribs with tenderness, no over lying bruising or erythema, no skin changes, no masses palpated, bilateral knees with mild bilateral joint like discomfort on palpation, no ligamentous laxity, negative mcmurray, 5/5 strength in bilateral quads, hamstrings, plantar and dorsiflexion, sensation to light touch intact bilaterally Exts: Non edematous BL  LE,  warm and well perfused.    Assessment/Plan: Please see individual problem list.  [name], MD [facility] PGY-3

## 2014-06-02 NOTE — Assessment & Plan Note (Signed)
Stable. Refill oxycodone.

## 2014-06-03 NOTE — Progress Notes (Signed)
One of the assigned preceptor of the day. 

## 2014-06-05 ENCOUNTER — Other Ambulatory Visit: Payer: Medicaid Other

## 2014-06-05 ENCOUNTER — Encounter: Payer: Self-pay | Admitting: Family Medicine

## 2014-06-05 DIAGNOSIS — R0781 Pleurodynia: Secondary | ICD-10-CM

## 2014-06-05 LAB — BASIC METABOLIC PANEL
BUN: 11 mg/dL (ref 6–23)
CO2: 26 mEq/L (ref 19–32)
Calcium: 9.3 mg/dL (ref 8.4–10.5)
Chloride: 104 mEq/L (ref 96–112)
Creat: 0.8 mg/dL (ref 0.50–1.10)
Glucose, Bld: 158 mg/dL — ABNORMAL HIGH (ref 70–99)
POTASSIUM: 4.2 meq/L (ref 3.5–5.3)
SODIUM: 141 meq/L (ref 135–145)

## 2014-06-05 NOTE — Progress Notes (Signed)
[facility] phlebotomist drew:  BMP 

## 2014-06-10 ENCOUNTER — Telehealth: Payer: Self-pay | Admitting: Family Medicine

## 2014-06-10 NOTE — Telephone Encounter (Signed)
Called patient to advise of A1c of 7.5 at last check. Discussed increasing metformin to 850 mg BID to help get better control of this. Will send this to pharmacy. Also informed of rib films and CXR. Advised her of results and that likely the pain is related to soft tissue injury. Advised if still having discomfort this week she should follow-up in the office. Advised to seek medical attention if develops chest pain or trouble breathing.

## 2014-07-14 ENCOUNTER — Other Ambulatory Visit: Payer: Self-pay | Admitting: Family Medicine

## 2014-07-22 ENCOUNTER — Other Ambulatory Visit: Payer: Self-pay

## 2014-07-24 ENCOUNTER — Other Ambulatory Visit: Payer: Self-pay | Admitting: Family Medicine

## 2014-07-24 MED ORDER — METFORMIN HCL 850 MG PO TABS: 850.0000 mg | ORAL_TABLET | Freq: Two times a day (BID) | ORAL | Status: DC

## 2014-08-06 ENCOUNTER — Ambulatory Visit: Payer: Self-pay | Admitting: Family Medicine

## 2014-08-18 ENCOUNTER — Other Ambulatory Visit: Payer: Self-pay | Admitting: Family Medicine

## 2014-08-27 ENCOUNTER — Other Ambulatory Visit: Payer: Self-pay | Admitting: *Deleted

## 2014-08-28 MED ORDER — MONTELUKAST SODIUM 10 MG PO TABS: 10.0000 mg | ORAL_TABLET | Freq: Every day | ORAL | Status: DC

## 2014-09-11 ENCOUNTER — Ambulatory Visit (HOSPITAL_BASED_OUTPATIENT_CLINIC_OR_DEPARTMENT_OTHER): Payer: Medicaid Other | Attending: Family Medicine | Admitting: Radiology

## 2014-09-11 VITALS — Ht 63.0 in | Wt 365.0 lb

## 2014-09-11 DIAGNOSIS — G4710 Hypersomnia, unspecified: Secondary | ICD-10-CM

## 2014-09-11 DIAGNOSIS — G4733 Obstructive sleep apnea (adult) (pediatric): Secondary | ICD-10-CM | POA: Insufficient documentation

## 2014-09-11 DIAGNOSIS — G4730 Sleep apnea, unspecified: Secondary | ICD-10-CM | POA: Diagnosis present

## 2014-09-12 ENCOUNTER — Ambulatory Visit: Payer: Self-pay | Admitting: Family Medicine

## 2014-09-14 ENCOUNTER — Encounter (HOSPITAL_BASED_OUTPATIENT_CLINIC_OR_DEPARTMENT_OTHER): Payer: Medicaid Other | Admitting: Internal Medicine

## 2014-09-14 DIAGNOSIS — G4710 Hypersomnia, unspecified: Secondary | ICD-10-CM

## 2014-09-14 DIAGNOSIS — G4730 Sleep apnea, unspecified: Secondary | ICD-10-CM | POA: Diagnosis not present

## 2014-09-14 NOTE — Progress Notes (Signed)
   Patient Name: [name], [name] Date: 09/11/2014 Gender: Female D.O.B: 10-May-1965 Age (years): 49 Referring Provider: Esmond Camper Height (inches): 63 Interpreting Physician: Baird Lyons MD, ABSM Weight (lbs): 365 RPSGT: Zadie Rhine BMI: 65 MRN: [id number] Neck Size: 16.50 CLINICAL INFORMATION Sleep Study Type: NPSG Split Protocol with CPAP titration     Indication for sleep study: Witnessed Apneas     Epworth Sleepiness Score: 20  MEDICATIONS Medications taken by the patient : charted for review Medications administered by patient during sleep study : No sleep medicine administered.  SLEEP STUDY TECHNIQUE As per the AASM Manual for the Scoring of Sleep and Associated Events v2.3 (April 2016) with a hypopnea requiring 4% desaturations.  The channels recorded and monitored were frontal, central and occipital EEG, electrooculogram (EOG), submentalis EMG (chin), nasal and oral airflow, thoracic and abdominal wall motion, anterior tibialis EMG, snore microphone, electrocardiogram, and pulse oximetry.  RESPIRATORY PARAMETERS  SLEEP ARCHITECTURE The study was initiated at 10:59:18 PM and terminated at 5:16:46 AM. The total recorded time was 377.5 minutes. EEG confirmed total sleep time was 303.0 minutes yielding a sleep efficiency of 80.3%. Sleep onset after lights out was 12.9 minutes with a REM latency of 268.0 minutes. The patient spent 3.63% of the night in stage N1 sleep, 69.14% in stage N2 sleep, 9.24% in stage N3 and 17.99% in REM. Wake after sleep onset (WASO) was 61.6 minutes. The Arousal Index was 36.8/hour. Split Night criteria was not met.  LEG MOVEMENT DATA The total Periodic Limb Movements of Sleep (PLMS) were 0. The PLMS index was 0.00 .  CARDIAC DATA The 2 lead EKG demonstrated sinus rhythm. The mean heart rate was 88.03 beats per minute. Other EKG findings include: None.  IMPRESSIONS Mild obstructive sleep apnea occurred during this study (AHI =  9.1/hour). Successful CPAP titration to 14 cwp No significant central sleep apnea occurred during this study (CAI = 0.2). Mild oxygen desaturation was noted during this study (Min O2 = 89.00). No snoring was audible during this study. No cardiac abnormalities were noted during this study. Clinically significant periodic limb movements did not occur during sleep.  DIAGNOSIS Obstructive Sleep Apnea (327.23 [G47.33 ICD-10])  RECOMMENDATIONS Trial home CPAP therapy 14 cwp, using small ResMed AirFit F-10 full face mask with heated humidifier Avoid alcohol, sedatives and other CNS depressants that may worsen sleep apnea and disrupt normal sleep architecture. Sleep hygiene should be reviewed to assess factors that may improve sleep quality. Weight management and regular exercise should be initiated or continued.    Deneise Lever Diplomate, American Board of Sleep Medicine  ELECTRONICALLY SIGNED ON:  09/14/2014, 1:41 PM [place] PH: [phone]   FX: [phone] [place]

## 2014-09-24 ENCOUNTER — Other Ambulatory Visit: Payer: Self-pay | Admitting: *Deleted

## 2014-09-24 MED ORDER — OXYBUTYNIN CHLORIDE 5 MG PO TABS: 5.0000 mg | ORAL_TABLET | Freq: Two times a day (BID) | ORAL | Status: DC

## 2014-10-05 ENCOUNTER — Encounter: Payer: Self-pay | Admitting: Family Medicine

## 2014-10-31 ENCOUNTER — Ambulatory Visit (INDEPENDENT_AMBULATORY_CARE_PROVIDER_SITE_OTHER): Payer: Medicaid Other | Admitting: Family Medicine

## 2014-10-31 VITALS — BP 177/114 | HR 111 | Temp 99.0°F | Wt 366.8 lb

## 2014-10-31 DIAGNOSIS — M1711 Unilateral primary osteoarthritis, right knee: Secondary | ICD-10-CM | POA: Diagnosis not present

## 2014-10-31 DIAGNOSIS — E119 Type 2 diabetes mellitus without complications: Secondary | ICD-10-CM

## 2014-10-31 DIAGNOSIS — Z7189 Other specified counseling: Secondary | ICD-10-CM

## 2014-10-31 DIAGNOSIS — G8929 Other chronic pain: Secondary | ICD-10-CM

## 2014-10-31 LAB — POCT GLYCOSYLATED HEMOGLOBIN (HGB A1C): HEMOGLOBIN A1C: 7.2

## 2014-10-31 MED ORDER — OXYCODONE HCL 5 MG PO TABS: 5.0000 mg | ORAL_TABLET | Freq: Three times a day (TID) | ORAL | Status: DC | PRN

## 2014-10-31 NOTE — Assessment & Plan Note (Signed)
Here for chronic pain management and medication refills. Continues to have chronic pain that is well controlled on her current regimen. She has not had any inappropriate or early medication requests. Pain contract this year with Dr. Birdie Sons. UDS up to date. She needs this medication to maintain her level of functionality. 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 She has seen orthopedics previously for this, and has had multiple injections. Per her, their recommendations remain knee replacement. 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  - Refer back to orthopedics per pt. Request for further operative evaluation.  - She will likely need to lose some weight for this to be an overly successful procedure for her.  - Continue current pain medication regimen.  - Pain contract up to date.  - Follow up in 3 months for refills.  - Will see what ortho has to say.

## 2014-10-31 NOTE — Progress Notes (Signed)
Patient ID: [name], female   DOB: 05-18-1965, 49 y.o.   MRN: [id number]   [facility] Yolande Jolly, MD Phone: [phone]  Subjective:   # Here for Chronic Pain Follow Up  - Continues to have bilateral knee pain with most pain in her right knee.  - She has been taking the oxycodone prescribed by Dr. Birdie Sons. This helps her pain.  - She has severe tricompartmental arthritis in that knee.  - She has been seen by orthopedics and sports medicine over the years who have recommended weight loss and eventual knee replacement.  - Has already had numerous knee injections and does not feel that there is benefit in this any more.  - She has lost contact with her orthopedist as he has retired. ?practice on [address].  - She says she would like to see ortho again for evaluation for knee replacement.  - She is working on weight loss and has lost some weight this year. Around 16 lbs.  - doing water aerobics, and working on her diet.  - She is mostly stressed because of her home situation. She has a difficult child.  - Requesting pain medication refills today.  - She has signed a pain contract with Dr. Birdie Sons this year, and has had a UDS.   # DMII - pt. With well controlled DMII - A1C improving from last check.  - She denies hypoglycemic episodes.  - She  Is tolerating her medicine well.  - Working on diet / exercise.  - No vision changes, chest pain, SOB, or worsening peripheral neuropathy.  - No polyuria, polydypsia, or polyphagia.   All relevant systems were reviewed and were negative unless otherwise noted in the HPI  Past Medical History Reviewed problem list.  Medications- reviewed and updated Current Outpatient Prescriptions  Medication Sig Dispense Refill  . atorvastatin (LIPITOR) 40 MG tablet Take 1 tablet (40 mg total) by mouth daily. 30 tablet 3  . budesonide-formoterol (SYMBICORT) 160-4.5 MCG/ACT inhaler Inhale 2 puffs into the lungs 2  (two) times daily as needed (for shortness of breath). 1 Inhaler 11  . cetirizine (ZYRTEC) 10 MG tablet Take 1 tablet (10 mg total) by mouth daily. 30 tablet 11  . cloNIDine (CATAPRES - DOSED IN MG/24 HR) 0.3 mg/24hr patch Place 1 patch (0.3 mg total) onto the skin once a week. Change on Friday. 4 patch 5  . cloNIDine (CATAPRES) 0.1 MG tablet TAKE 1 TABLET (0.1 MG TOTAL) BY MOUTH 3 (THREE) TIMES DAILY. ONLY TO BE TAKEN IF PATCH FALLS OFF 30 tablet 1  . dexamethasone (DECADRON) 4 MG tablet Take 2 mg by mouth daily as needed (arthritic flares).    Marland Kitchen dicyclomine (BENTYL) 20 MG tablet Take 1 tablet (20 mg total) by mouth every 6 (six) hours as needed for spasms (abdominal pain). 120 tablet 5  . Elastic Bandages & Supports (SLIP-ON KNEE BRACE) MISC Please evaluate/measure and dispense appropriate braces for severe DJD. Call [phone] with any questions. 2 each 0  . etodolac (LODINE) 400 MG tablet Take 400 mg by mouth 2 (two) times daily.    Marland Kitchen FLUoxetine (PROZAC) 40 MG capsule Take 1 capsule (40 mg total) by mouth daily. 30 capsule 11  . furosemide (LASIX) 20 MG tablet TAKE 2 TABLETS BY MOUTH TWICE A DAY 120 tablet 1  . lisinopril-hydrochlorothiazide (PRINZIDE,ZESTORETIC) 20-12.5 MG per tablet Take 1 tablet by mouth daily. 30 tablet 11  . medroxyPROGESTERone (DEPO-PROVERA) 150 MG/ML injection Inject 150  mg into the muscle every 3 (three) months.     . metFORMIN (GLUCOPHAGE) 850 MG tablet Take 1 tablet (850 mg total) by mouth 2 (two) times daily with a meal. 180 tablet 1  . montelukast (SINGULAIR) 10 MG tablet Take 1 tablet (10 mg total) by mouth at bedtime. 30 tablet 5  . omeprazole (PRILOSEC) 40 MG capsule TAKE 1 CAPSULE (40 MG TOTAL) BY MOUTH 2 (TWO) TIMES DAILY. 60 capsule 2  . oxybutynin (DITROPAN) 5 MG tablet Take 1 tablet (5 mg total) by mouth 2 (two) times daily. 60 tablet 3  . oxyCODONE (OXY IR/ROXICODONE) 5 MG immediate release tablet Take 1 tablet (5 mg total) by mouth every 8 (eight) hours as  needed for severe pain. 90 tablet 0  . oxyCODONE (OXY IR/ROXICODONE) 5 MG immediate release tablet Take 1 tablet (5 mg total) by mouth every 8 (eight) hours as needed for severe pain. Do not fill until 30 days after this prescription date. 90 tablet 0  . oxyCODONE (OXY IR/ROXICODONE) 5 MG immediate release tablet Take 1 tablet (5 mg total) by mouth every 8 (eight) hours as needed for severe pain. Do not fill until 60 days after this prescription date. 90 tablet 0  . polyethylene glycol (MIRALAX / GLYCOLAX) packet Take 17 g by mouth daily as needed for mild constipation.    . predniSONE (DELTASONE) 10 MG tablet TAKE 1 TABLET (10 MG TOTAL) BY MOUTH DAILY. 90 tablet 1  . PROAIR HFA 108 (90 BASE) MCG/ACT inhaler INHALE 2 PUFFS BY MOUTH EVERY 4 HOURS AS NEEDED 8.5 Inhaler 2  . Wheat Dextrin (BENEFIBER) POWD 2 tablespoons each day 730 g 11   No current facility-administered medications for this visit.   Chief complaint-noted No additions to family history Social history- patient is a non smoker  Objective: BP 177/114 mmHg  Pulse 111  Temp(Src) 99 F (37.2 C) (Oral)  Wt 366 lb 12.8 oz (166.379 kg) Gen: NAD, alert, cooperative with exam HEENT: NCAT, EOMI, PERRL Neck: FROM, supple CV: RRR, good S1/S2, no murmur Resp: CTABL, no wheezes, non-labored Abd: SNTND, BS present, no guarding or organomegaly Ext: No edema, warm, normal tone, moves UE/LE spontaneously B/L Knees: Crepitus bilaterally, stable joints without evidence of ligamentous degradation, Medial joint line tenderness bilatearlly. Probable effusions bilaterally.  Neuro: Alert and oriented, No gross deficits Skin: no rashes no lesions  Assessment/Plan: See problem based a/p  Encounter for chronic pain management Here for chronic pain management and medication refills. Continues to have chronic pain that is well controlled on her current regimen. She has not had any inappropriate or early medication requests. Pain contract this year  with Dr. Birdie Sons. UDS up to date. She needs this medication to maintain her level of functionality. 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 She has seen orthopedics previously for this, and has had multiple injections. Per her, their recommendations remain knee replacement. 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  - Refer back to orthopedics per pt. Request for further operative evaluation.  - She will likely need to lose some weight for this to be an overly successful procedure for her.  - Continue current pain medication regimen.  - Pain contract up to date.  - Follow up in 3 months for refills.  - Will see what ortho has to say.   Diabetes Pt. On metformin 850mg  BID and appears to be well controlled. Working on her diet and  exercise as well as weight loss. No red flags at this time.  - continue metformin - F/u in 3 months for pain mgmt.  - F/U A1C in 6 months.

## 2014-10-31 NOTE — Patient Instructions (Signed)
Thanks for coming in today.   Bring your son back to clinic and we will discuss the school situation further. Hopefully we can make it easier on both of you.   We have refilled your pain medicine today.   I have also referred you to orthopedics.   Thanks for letting us take care of you.  We will see you back at your next follow up appointment in 3 months.   Sincerely,  Devota Pace, MD Family Medicine - PGY 2

## 2014-10-31 NOTE — Assessment & Plan Note (Signed)
Pt. On metformin 850mg  BID and appears to be well controlled. Working on her diet and exercise as well as weight loss. No red flags at this time.  - continue metformin - F/u in 3 months for pain mgmt.  - F/U A1C in 6 months.

## 2014-11-11 ENCOUNTER — Other Ambulatory Visit: Payer: Self-pay | Admitting: *Deleted

## 2014-11-11 MED ORDER — METFORMIN HCL 850 MG PO TABS: 850.0000 mg | ORAL_TABLET | Freq: Two times a day (BID) | ORAL | Status: DC

## 2014-11-11 MED ORDER — CLONIDINE HCL 0.3 MG/24HR TD PTWK: 0.3000 mg | MEDICATED_PATCH | TRANSDERMAL | Status: DC

## 2014-11-11 MED ORDER — ALBUTEROL SULFATE HFA 108 (90 BASE) MCG/ACT IN AERS: 2.0000 | INHALATION_SPRAY | RESPIRATORY_TRACT | Status: DC | PRN

## 2014-11-11 MED ORDER — FUROSEMIDE 20 MG PO TABS: 40.0000 mg | ORAL_TABLET | Freq: Two times a day (BID) | ORAL | Status: DC

## 2014-11-11 MED ORDER — MONTELUKAST SODIUM 10 MG PO TABS: 10.0000 mg | ORAL_TABLET | Freq: Every day | ORAL | Status: DC

## 2014-11-20 ENCOUNTER — Ambulatory Visit (INDEPENDENT_AMBULATORY_CARE_PROVIDER_SITE_OTHER): Payer: Medicaid Other | Admitting: *Deleted

## 2014-11-20 DIAGNOSIS — Z23 Encounter for immunization: Secondary | ICD-10-CM | POA: Diagnosis present

## 2014-11-21 ENCOUNTER — Ambulatory Visit: Payer: Self-pay

## 2014-12-17 ENCOUNTER — Encounter: Payer: Self-pay | Admitting: Family Medicine

## 2014-12-17 ENCOUNTER — Other Ambulatory Visit: Payer: Self-pay | Admitting: *Deleted

## 2014-12-18 MED ORDER — PREDNISONE 10 MG PO TABS: ORAL_TABLET | ORAL | Status: DC

## 2015-01-06 ENCOUNTER — Encounter: Payer: Self-pay | Admitting: Family Medicine

## 2015-01-07 ENCOUNTER — Other Ambulatory Visit: Payer: Self-pay | Admitting: Family Medicine

## 2015-01-07 DIAGNOSIS — G4733 Obstructive sleep apnea (adult) (pediatric): Secondary | ICD-10-CM

## 2015-01-07 NOTE — Progress Notes (Signed)
Order faxed to [facility] along with sleep study note, insurance card and demographics.  Jayelle Page,CMA

## 2015-01-14 ENCOUNTER — Telehealth: Payer: Self-pay | Admitting: *Deleted

## 2015-01-14 NOTE — Telephone Encounter (Signed)
Received a refill request from [facility] for Sulindac 200 mg tablet.  Medication is not listed on current med list.  Refill request form placed in provider box for review.  Clovis Pu, RN

## 2015-01-21 NOTE — Telephone Encounter (Signed)
Cannot fill at this time thanks  CGM MD

## 2015-02-03 ENCOUNTER — Encounter: Payer: Self-pay | Admitting: Family Medicine

## 2015-02-04 MED ORDER — SULINDAC 200 MG PO TABS: 200.0000 mg | ORAL_TABLET | Freq: Two times a day (BID) | ORAL | Status: DC

## 2015-02-04 NOTE — Telephone Encounter (Signed)
Ms. Zo,   I sent the prescription to your pharmacy. Please call the clinic to reschedule your appointment. The front desk will be able to best help you with this. I look forward to seeing you in the office soon.   Sincerely,  Devota Pace, MD

## 2015-02-05 ENCOUNTER — Ambulatory Visit: Payer: Self-pay | Admitting: Family Medicine

## 2015-02-12 ENCOUNTER — Encounter: Payer: Self-pay | Admitting: Family Medicine

## 2015-02-12 ENCOUNTER — Ambulatory Visit (INDEPENDENT_AMBULATORY_CARE_PROVIDER_SITE_OTHER): Payer: Medicaid Other | Admitting: Family Medicine

## 2015-02-12 VITALS — BP 150/102 | HR 110 | Temp 99.3°F | Resp 22 | Ht 64.0 in | Wt 360.0 lb

## 2015-02-12 DIAGNOSIS — Z7189 Other specified counseling: Secondary | ICD-10-CM | POA: Diagnosis not present

## 2015-02-12 DIAGNOSIS — I10 Essential (primary) hypertension: Secondary | ICD-10-CM | POA: Diagnosis not present

## 2015-02-12 DIAGNOSIS — Z309 Encounter for contraceptive management, unspecified: Secondary | ICD-10-CM | POA: Diagnosis not present

## 2015-02-12 DIAGNOSIS — E0810 Diabetes mellitus due to underlying condition with ketoacidosis without coma: Secondary | ICD-10-CM | POA: Diagnosis not present

## 2015-02-12 DIAGNOSIS — Z3009 Encounter for other general counseling and advice on contraception: Secondary | ICD-10-CM

## 2015-02-12 DIAGNOSIS — G8929 Other chronic pain: Secondary | ICD-10-CM

## 2015-02-12 LAB — POCT URINE PREGNANCY: Preg Test, Ur: NEGATIVE

## 2015-02-12 LAB — POCT GLYCOSYLATED HEMOGLOBIN (HGB A1C): HEMOGLOBIN A1C: 7.8

## 2015-02-12 MED ORDER — LISINOPRIL-HYDROCHLOROTHIAZIDE 20-25 MG PO TABS: 1.0000 | ORAL_TABLET | Freq: Every day | ORAL | Status: DC

## 2015-02-12 MED ORDER — OXYCODONE HCL 5 MG PO TABS: 5.0000 mg | ORAL_TABLET | Freq: Three times a day (TID) | ORAL | Status: DC | PRN

## 2015-02-12 MED ORDER — OXYCODONE HCL 5 MG PO TABS
5.0000 mg | ORAL_TABLET | Freq: Three times a day (TID) | ORAL | Status: DC | PRN
Start: 2015-02-12 — End: 2015-02-20

## 2015-02-12 MED ORDER — MEDROXYPROGESTERONE ACETATE 150 MG/ML IM SUSP
150.0000 mg | Freq: Once | INTRAMUSCULAR | Status: AC
  Administered 2015-02-12: 150 mg via INTRAMUSCULAR

## 2015-02-12 MED ORDER — OXYCODONE HCL 5 MG PO TABS
5.0000 mg | ORAL_TABLET | Freq: Three times a day (TID) | ORAL | Status: DC | PRN
Start: 2015-02-12 — End: 2015-06-05

## 2015-02-12 MED ORDER — CLONIDINE HCL 0.3 MG/24HR TD PTWK: 0.3000 mg | MEDICATED_PATCH | TRANSDERMAL | Status: DC

## 2015-02-12 MED ORDER — CLONIDINE HCL 0.1 MG PO TABS: ORAL_TABLET | ORAL | Status: DC

## 2015-02-12 NOTE — Progress Notes (Signed)
Patient here for 3 month FU  Patient needs medication review today.  Patient complains of right neck/shoudler pain scaled at an 8 described as aching and throbbing.  Patient tolerated Depo Injection well.

## 2015-02-12 NOTE — Patient Instructions (Addendum)
Thanks for coming in today.   We will refer you to Bariatric Surgery.   We have refilled your chronic pain medication. We will get a urine drug screen today.   We will give you the depo shot today after the pregnancy test is confirmed negative.   We will adjust your blood pressure medicine. You will now take Lisinopril- HCTZ 20-25mg  daily along with clonidine patch, and the fluid pill as needed.   Be sure to monitor your blood pressure at home.   Schedule a follow up appointment in 1 month to evaluate your right shoulder.   Thanks for letting us take care of you.   Sincerely, Devota Pace, MD Family Medicine - PGY 2

## 2015-02-12 NOTE — Progress Notes (Signed)
Patient ID: [name], female   DOB: 07-Dec-1965, 50 y.o.   MRN: [id number]   [facility] Yolande Jolly, MD Phone: [phone]  Subjective:   # Encounter for Chronic Pain - Pt. Here for follow up of her chronic pain.  - She continues to have chronic pain of her knees due to severe arthritis.  - she saw orthopedics again since our last visit, and there is still not a surgical option for her at this time.  - She is interested in exploring the option of weight loss surgery in order to get her down to a weight at which she could undergo knee replacment.  - she has not been taking more of her medication than prescribed. She is willing to have a UDS performed today and resign the pain contract. No issues otherwise.   # HTN  - She continues to have very poorly controlled blood pressure. Mostly related to weight.   - She has been on the Clonidine patch 0.3 mg, HCTZ 12.5 / Lisinopril 20mg  combo pill.  - She says she gets headaches intermittently, and her blood pressure was around 138 / 102 at the time of her most recent headache.  - She does not have chest pain, and no change in her lower extremity edema. She has SOB whenever she lays flat.  - she is compliant with her medications, and she reports compliance with her diet. She is continuing to try to lose weight and eat more healthily.   # Encounter for Contraception  - she desires to get her depo provera shot today.  - she is overdue.  - has not been sexually active.  - no new partners.  - no discharge, burning, itching, or discomfort.  - she does not have periods while on provera.     All relevant systems were reviewed and were negative unless otherwise noted in the HPI  Past Medical History Reviewed problem list.  Medications- reviewed and updated Current Outpatient Prescriptions  Medication Sig Dispense Refill  . albuterol (PROAIR HFA) 108 (90 BASE) MCG/ACT inhaler Inhale 2 puffs into the lungs every 4  (four) hours as needed. 8.5 Inhaler 2  . atorvastatin (LIPITOR) 40 MG tablet Take 1 tablet (40 mg total) by mouth daily. 30 tablet 3  . budesonide-formoterol (SYMBICORT) 160-4.5 MCG/ACT inhaler Inhale 2 puffs into the lungs 2 (two) times daily as needed (for shortness of breath). 1 Inhaler 11  . cetirizine (ZYRTEC) 10 MG tablet Take 1 tablet (10 mg total) by mouth daily. 30 tablet 11  . cloNIDine (CATAPRES - DOSED IN MG/24 HR) 0.3 mg/24hr patch Place 1 patch (0.3 mg total) onto the skin once a week. Change on Friday. 4 patch 5  . cloNIDine (CATAPRES) 0.1 MG tablet TAKE 1 TABLET (0.1 MG TOTAL) BY MOUTH 3 (THREE) TIMES DAILY. ONLY TO BE TAKEN IF PATCH FALLS OFF 30 tablet 1  . dexamethasone (DECADRON) 4 MG tablet Take 2 mg by mouth daily as needed (arthritic flares).    Saturday dicyclomine (BENTYL) 20 MG tablet Take 1 tablet (20 mg total) by mouth every 6 (six) hours as needed for spasms (abdominal pain). 120 tablet 5  . Elastic Bandages & Supports (SLIP-ON KNEE BRACE) MISC Please evaluate/measure and dispense appropriate braces for severe DJD. Call [phone] with any questions. 2 each 0  . etodolac (LODINE) 400 MG tablet Take 400 mg by mouth 2 (two) times daily.    [id number] FLUoxetine (PROZAC) 40 MG capsule Take 1 capsule (  40 mg total) by mouth daily. 30 capsule 11  . furosemide (LASIX) 20 MG tablet Take 2 tablets (40 mg total) by mouth 2 (two) times daily. 120 tablet 1  . lisinopril-hydrochlorothiazide (PRINZIDE,ZESTORETIC) 20-12.5 MG per tablet Take 1 tablet by mouth daily. 30 tablet 11  . lisinopril-hydrochlorothiazide (PRINZIDE,ZESTORETIC) 20-25 MG tablet Take 1 tablet by mouth daily. 90 tablet 3  . medroxyPROGESTERone (DEPO-PROVERA) 150 MG/ML injection Inject 150 mg into the muscle every 3 (three) months.     . metFORMIN (GLUCOPHAGE) 850 MG tablet Take 1 tablet (850 mg total) by mouth 2 (two) times daily with a meal. 180 tablet 1  . montelukast (SINGULAIR) 10 MG tablet Take 1 tablet (10 mg total) by mouth at  bedtime. 30 tablet 5  . omeprazole (PRILOSEC) 40 MG capsule TAKE 1 CAPSULE (40 MG TOTAL) BY MOUTH 2 (TWO) TIMES DAILY. 60 capsule 2  . oxybutynin (DITROPAN) 5 MG tablet Take 1 tablet (5 mg total) by mouth 2 (two) times daily. 60 tablet 3  . oxyCODONE (OXY IR/ROXICODONE) 5 MG immediate release tablet Take 1 tablet (5 mg total) by mouth every 8 (eight) hours as needed for severe pain. 90 tablet 0  . oxyCODONE (OXY IR/ROXICODONE) 5 MG immediate release tablet Take 1 tablet (5 mg total) by mouth every 8 (eight) hours as needed for severe pain. Do not fill until 30 days after this prescription date. 90 tablet 0  . oxyCODONE (OXY IR/ROXICODONE) 5 MG immediate release tablet Take 1 tablet (5 mg total) by mouth every 8 (eight) hours as needed for severe pain. Do not fill until 60 days after this prescription date. 90 tablet 0  . polyethylene glycol (MIRALAX / GLYCOLAX) packet Take 17 g by mouth daily as needed for mild constipation.    . predniSONE (DELTASONE) 10 MG tablet TAKE 1 TABLET (10 MG TOTAL) BY MOUTH DAILY. 90 tablet 1  . sulindac (CLINORIL) 200 MG tablet Take 1 tablet (200 mg total) by mouth 2 (two) times daily. 180 tablet 0  . Wheat Dextrin (BENEFIBER) POWD 2 tablespoons each day 730 g 11   No current facility-administered medications for this visit.   Chief complaint-noted No additions to family history Social history- patient is a non smoker  Objective: BP 152/95 mmHg  Pulse 110  Temp(Src) 99.3 F (37.4 C) (Oral)  Resp 22  Ht 5\' 4"  (1.626 m)  Wt 360 lb (163.295 kg)  BMI 61.76 kg/m2  SpO2 97% Gen: NAD, alert, cooperative with exam, morbidly obese.  HEENT: NCAT, EOMI, PERRL Neck: FROM, supple CV: RRR, good S1/S2, no murmur Resp: CTABL, no wheezes, non-labored Abd: SNTND, BS present, no guarding or organomegaly Ext: No edema, warm, normal tone, moves UE/LE spontaneously Neuro: Alert and oriented, No gross deficits Skin: no rashes no lesions  Assessment/Plan:  # Encounter for  Chronic Pain - she is compliant, no issues, she is here for refills for her knee pain. Pain is not better or worse.  - resigned pain contract today - UDS done today and positive for opiates as it should be.  - refilled her prescription.  - follow up in 3 months.   # HTN - Increased her BP medicine to HCTZ 25 mg dialy with lisinopril 20mg  and Clonidine 0.3mg  24 hr patch.  - will have her follow up for BP check.  - need to get her BP to goal of 140/90.   # Weight Loss - we did discuss this, and she would like a referral to bariatric surgery  for consultation.  - I mentioned that this is quite an involved process, and that she would have to be sincerely committed to undergo such a surgical intervention.  - she seems reasonably motivated.  - referral to bariatrics placed.   # Contraception  - urine pregnancy test negative today - depo provera given.

## 2015-02-13 LAB — DRUG SCREEN, URINE
AMPHETAMINE SCRN UR: NEGATIVE
BARBITURATE QUANT UR: NEGATIVE
Benzodiazepines.: NEGATIVE
Cocaine Metabolites: NEGATIVE
Creatinine,U: 291.62 mg/dL
MARIJUANA METABOLITE: NEGATIVE
Methadone: NEGATIVE
Opiates: POSITIVE — AB
Phencyclidine (PCP): NEGATIVE
Propoxyphene: NEGATIVE

## 2015-02-19 ENCOUNTER — Other Ambulatory Visit: Payer: Self-pay | Admitting: Family Medicine

## 2015-02-19 MED ORDER — PROMETHAZINE HCL 12.5 MG PO TABS: 12.5000 mg | ORAL_TABLET | Freq: Four times a day (QID) | ORAL | Status: DC | PRN

## 2015-02-20 ENCOUNTER — Emergency Department (HOSPITAL_COMMUNITY): Payer: Medicaid Other

## 2015-02-20 ENCOUNTER — Encounter (HOSPITAL_COMMUNITY): Payer: Self-pay

## 2015-02-20 ENCOUNTER — Inpatient Hospital Stay (HOSPITAL_COMMUNITY)
Admission: EM | Admit: 2015-02-20 | Discharge: 2015-02-25 | DRG: 194 | Disposition: A | Discharge status: Home / Self Care | Payer: Medicaid Other | Attending: Family Medicine | Admitting: Family Medicine

## 2015-02-20 DIAGNOSIS — R079 Chest pain, unspecified: Secondary | ICD-10-CM | POA: Diagnosis present

## 2015-02-20 DIAGNOSIS — T508X5A Adverse effect of diagnostic agents, initial encounter: Secondary | ICD-10-CM | POA: Diagnosis not present

## 2015-02-20 DIAGNOSIS — Z8249 Family history of ischemic heart disease and other diseases of the circulatory system: Secondary | ICD-10-CM | POA: Diagnosis not present

## 2015-02-20 DIAGNOSIS — I10 Essential (primary) hypertension: Secondary | ICD-10-CM | POA: Diagnosis present

## 2015-02-20 DIAGNOSIS — J45909 Unspecified asthma, uncomplicated: Secondary | ICD-10-CM | POA: Diagnosis present

## 2015-02-20 DIAGNOSIS — Z79891 Long term (current) use of opiate analgesic: Secondary | ICD-10-CM | POA: Diagnosis not present

## 2015-02-20 DIAGNOSIS — M069 Rheumatoid arthritis, unspecified: Secondary | ICD-10-CM | POA: Diagnosis present

## 2015-02-20 DIAGNOSIS — Z7984 Long term (current) use of oral hypoglycemic drugs: Secondary | ICD-10-CM

## 2015-02-20 DIAGNOSIS — E0810 Diabetes mellitus due to underlying condition with ketoacidosis without coma: Secondary | ICD-10-CM

## 2015-02-20 DIAGNOSIS — E2740 Unspecified adrenocortical insufficiency: Secondary | ICD-10-CM | POA: Diagnosis present

## 2015-02-20 DIAGNOSIS — Z6841 Body Mass Index (BMI) 40.0 and over, adult: Secondary | ICD-10-CM | POA: Diagnosis present

## 2015-02-20 DIAGNOSIS — J8410 Pulmonary fibrosis, unspecified: Secondary | ICD-10-CM | POA: Diagnosis present

## 2015-02-20 DIAGNOSIS — I959 Hypotension, unspecified: Secondary | ICD-10-CM | POA: Diagnosis not present

## 2015-02-20 DIAGNOSIS — Z8661 Personal history of infections of the central nervous system: Secondary | ICD-10-CM | POA: Diagnosis not present

## 2015-02-20 DIAGNOSIS — R350 Frequency of micturition: Secondary | ICD-10-CM | POA: Diagnosis present

## 2015-02-20 DIAGNOSIS — E876 Hypokalemia: Secondary | ICD-10-CM | POA: Diagnosis not present

## 2015-02-20 DIAGNOSIS — F419 Anxiety disorder, unspecified: Secondary | ICD-10-CM | POA: Diagnosis present

## 2015-02-20 DIAGNOSIS — D869 Sarcoidosis, unspecified: Secondary | ICD-10-CM | POA: Diagnosis present

## 2015-02-20 DIAGNOSIS — Z91013 Allergy to seafood: Secondary | ICD-10-CM | POA: Diagnosis not present

## 2015-02-20 DIAGNOSIS — Z7952 Long term (current) use of systemic steroids: Secondary | ICD-10-CM

## 2015-02-20 DIAGNOSIS — F329 Major depressive disorder, single episode, unspecified: Secondary | ICD-10-CM | POA: Diagnosis present

## 2015-02-20 DIAGNOSIS — Z823 Family history of stroke: Secondary | ICD-10-CM | POA: Diagnosis not present

## 2015-02-20 DIAGNOSIS — K760 Fatty (change of) liver, not elsewhere classified: Secondary | ICD-10-CM | POA: Diagnosis present

## 2015-02-20 DIAGNOSIS — E662 Morbid (severe) obesity with alveolar hypoventilation: Secondary | ICD-10-CM | POA: Diagnosis present

## 2015-02-20 DIAGNOSIS — G7000 Myasthenia gravis without (acute) exacerbation: Secondary | ICD-10-CM | POA: Diagnosis present

## 2015-02-20 DIAGNOSIS — Z9049 Acquired absence of other specified parts of digestive tract: Secondary | ICD-10-CM

## 2015-02-20 DIAGNOSIS — N3946 Mixed incontinence: Secondary | ICD-10-CM | POA: Diagnosis present

## 2015-02-20 DIAGNOSIS — K219 Gastro-esophageal reflux disease without esophagitis: Secondary | ICD-10-CM | POA: Diagnosis present

## 2015-02-20 DIAGNOSIS — Z91018 Allergy to other foods: Secondary | ICD-10-CM | POA: Diagnosis not present

## 2015-02-20 DIAGNOSIS — N179 Acute kidney failure, unspecified: Secondary | ICD-10-CM | POA: Diagnosis not present

## 2015-02-20 DIAGNOSIS — Z791 Long term (current) use of non-steroidal anti-inflammatories (NSAID): Secondary | ICD-10-CM

## 2015-02-20 DIAGNOSIS — Z833 Family history of diabetes mellitus: Secondary | ICD-10-CM

## 2015-02-20 DIAGNOSIS — J189 Pneumonia, unspecified organism: Secondary | ICD-10-CM | POA: Diagnosis present

## 2015-02-20 DIAGNOSIS — K580 Irritable bowel syndrome with diarrhea: Secondary | ICD-10-CM | POA: Diagnosis present

## 2015-02-20 DIAGNOSIS — E119 Type 2 diabetes mellitus without complications: Secondary | ICD-10-CM | POA: Diagnosis present

## 2015-02-20 DIAGNOSIS — R0902 Hypoxemia: Secondary | ICD-10-CM | POA: Diagnosis present

## 2015-02-20 DIAGNOSIS — R1110 Vomiting, unspecified: Secondary | ICD-10-CM | POA: Diagnosis not present

## 2015-02-20 DIAGNOSIS — Z79899 Other long term (current) drug therapy: Secondary | ICD-10-CM

## 2015-02-20 DIAGNOSIS — R112 Nausea with vomiting, unspecified: Secondary | ICD-10-CM | POA: Diagnosis present

## 2015-02-20 HISTORY — DX: Pneumonia, unspecified organism: J18.9

## 2015-02-20 LAB — URINALYSIS, ROUTINE W REFLEX MICROSCOPIC
Bilirubin Urine: NEGATIVE
Glucose, UA: NEGATIVE mg/dL
Hgb urine dipstick: NEGATIVE
Ketones, ur: >80 mg/dL — AB
Leukocytes, UA: NEGATIVE
Nitrite: NEGATIVE
Protein, ur: NEGATIVE mg/dL
Specific Gravity, Urine: 1.018 (ref 1.005–1.030)
pH: 8.5 — ABNORMAL HIGH (ref 5.0–8.0)

## 2015-02-20 LAB — BASIC METABOLIC PANEL
Anion gap: 18 — ABNORMAL HIGH (ref 5–15)
BUN: <5 mg/dL — ABNORMAL LOW (ref 6–20)
CALCIUM: 10.6 mg/dL — AB (ref 8.9–10.3)
CHLORIDE: 102 mmol/L (ref 101–111)
CO2: 22 mmol/L (ref 22–32)
CREATININE: 0.86 mg/dL (ref 0.44–1.00)
GFR calc non Af Amer: >60 mL/min (ref >60)
GLUCOSE: 226 mg/dL — AB (ref 65–99)
Potassium: 3.2 mmol/L — ABNORMAL LOW (ref 3.5–5.1)
Sodium: 142 mmol/L (ref 135–145)

## 2015-02-20 LAB — CBC
HCT: 40.9 % (ref 36.0–46.0)
HEMOGLOBIN: 13.5 g/dL (ref 12.0–15.0)
MCH: 27.8 pg (ref 26.0–34.0)
MCHC: 33 g/dL (ref 30.0–36.0)
MCV: 84.3 fL (ref 78.0–100.0)
PLATELETS: 322 10*3/uL (ref 150–400)
RBC: 4.85 MIL/uL (ref 3.87–5.11)
RDW: 15.3 % (ref 11.5–15.5)
WBC: 14.8 10*3/uL — ABNORMAL HIGH (ref 4.0–10.5)

## 2015-02-20 LAB — I-STAT VENOUS BLOOD GAS, ED
Acid-Base Excess: 2 mmol/L (ref 0.0–2.0)
Bicarbonate: 25 mEq/L — ABNORMAL HIGH (ref 20.0–24.0)
O2 Saturation: 67 %
TCO2: 26 mmol/L (ref 0–100)
pCO2, Ven: 32.7 mmHg — ABNORMAL LOW (ref 45.0–50.0)
pH, Ven: 7.491 — ABNORMAL HIGH (ref 7.250–7.300)
pO2, Ven: 31 mmHg (ref 30.0–45.0)

## 2015-02-20 LAB — I-STAT TROPONIN, ED: Troponin i, poc: 0 ng/mL (ref 0.00–0.08)

## 2015-02-20 LAB — BRAIN NATRIURETIC PEPTIDE: B Natriuretic Peptide: 30.1 pg/mL (ref 0.0–100.0)

## 2015-02-20 LAB — TROPONIN I
TROPONIN I: 0.03 ng/mL (ref <0.031)
Troponin I: 0.03 ng/mL (ref <0.031)

## 2015-02-20 LAB — GLUCOSE, CAPILLARY
GLUCOSE-CAPILLARY: 165 mg/dL — AB (ref 65–99)
GLUCOSE-CAPILLARY: 180 mg/dL — AB (ref 65–99)

## 2015-02-20 LAB — D-DIMER, QUANTITATIVE (NOT AT ARMC): D-Dimer, Quant: 1.22 ug/mL-FEU — ABNORMAL HIGH (ref 0.00–0.50)

## 2015-02-20 MED ORDER — LISINOPRIL 20 MG PO TABS
20.0000 mg | ORAL_TABLET | Freq: Once | ORAL | Status: AC
  Administered 2015-02-20: 20 mg via ORAL
  Filled 2015-02-20: qty 1

## 2015-02-20 MED ORDER — HYDROCHLOROTHIAZIDE 25 MG PO TABS
25.0000 mg | ORAL_TABLET | Freq: Every day | ORAL | Status: DC
  Administered 2015-02-22: 25 mg via ORAL
  Filled 2015-02-20 (×2): qty 1

## 2015-02-20 MED ORDER — PROMETHAZINE HCL 25 MG PO TABS
12.5000 mg | ORAL_TABLET | Freq: Four times a day (QID) | ORAL | Status: DC | PRN
  Administered 2015-02-20: 12.5 mg via ORAL
  Filled 2015-02-20 (×3): qty 1

## 2015-02-20 MED ORDER — ONDANSETRON HCL 4 MG/2ML IJ SOLN
4.0000 mg | Freq: Once | INTRAMUSCULAR | Status: AC | PRN
  Administered 2015-02-20: 4 mg via INTRAVENOUS
  Filled 2015-02-20: qty 2

## 2015-02-20 MED ORDER — FLUOXETINE HCL 20 MG PO CAPS
40.0000 mg | ORAL_CAPSULE | Freq: Every day | ORAL | Status: DC
  Administered 2015-02-22 – 2015-02-25 (×4): 40 mg via ORAL
  Filled 2015-02-20 (×6): qty 2

## 2015-02-20 MED ORDER — LISINOPRIL 20 MG PO TABS
20.0000 mg | ORAL_TABLET | Freq: Every day | ORAL | Status: DC
  Administered 2015-02-22 – 2015-02-23 (×2): 20 mg via ORAL
  Filled 2015-02-20 (×3): qty 1

## 2015-02-20 MED ORDER — PREDNISONE 10 MG PO TABS
10.0000 mg | ORAL_TABLET | Freq: Every day | ORAL | Status: DC
  Filled 2015-02-20: qty 1

## 2015-02-20 MED ORDER — LORAZEPAM 2 MG/ML IJ SOLN
1.0000 mg | Freq: Once | INTRAMUSCULAR | Status: AC
  Administered 2015-02-20: 1 mg via INTRAVENOUS
  Filled 2015-02-20: qty 1

## 2015-02-20 MED ORDER — LORATADINE 10 MG PO TABS
10.0000 mg | ORAL_TABLET | Freq: Every day | ORAL | Status: DC
  Administered 2015-02-22 – 2015-02-25 (×4): 10 mg via ORAL
  Filled 2015-02-20 (×5): qty 1

## 2015-02-20 MED ORDER — HYDRALAZINE HCL 20 MG/ML IJ SOLN
5.0000 mg | INTRAMUSCULAR | Status: DC | PRN
  Administered 2015-02-20 – 2015-02-22 (×4): 5 mg via INTRAVENOUS
  Filled 2015-02-20 (×4): qty 1

## 2015-02-20 MED ORDER — POLYETHYLENE GLYCOL 3350 17 G PO PACK: 17.0000 g | PACK | Freq: Every day | ORAL | Status: DC | PRN

## 2015-02-20 MED ORDER — IOHEXOL 350 MG/ML SOLN
100.0000 mL | Freq: Once | INTRAVENOUS | Status: AC | PRN
  Administered 2015-02-20: 100 mL via INTRAVENOUS

## 2015-02-20 MED ORDER — HYDROCHLOROTHIAZIDE 25 MG PO TABS
25.0000 mg | ORAL_TABLET | Freq: Once | ORAL | Status: AC
  Administered 2015-02-20: 25 mg via ORAL
  Filled 2015-02-20: qty 1

## 2015-02-20 MED ORDER — CLONIDINE HCL 0.3 MG/24HR TD PTWK
0.3000 mg | MEDICATED_PATCH | TRANSDERMAL | Status: DC
  Filled 2015-02-20: qty 1

## 2015-02-20 MED ORDER — INSULIN ASPART 100 UNIT/ML ~~LOC~~ SOLN
0.0000 [IU] | Freq: Three times a day (TID) | SUBCUTANEOUS | Status: DC
  Administered 2015-02-21 – 2015-02-22 (×4): 4 [IU] via SUBCUTANEOUS
  Administered 2015-02-22 (×2): 3 [IU] via SUBCUTANEOUS
  Administered 2015-02-23: 7 [IU] via SUBCUTANEOUS
  Administered 2015-02-23: 4 [IU] via SUBCUTANEOUS
  Administered 2015-02-23: 7 [IU] via SUBCUTANEOUS
  Administered 2015-02-24: 4 [IU] via SUBCUTANEOUS
  Administered 2015-02-24: 3 [IU] via SUBCUTANEOUS
  Administered 2015-02-24: 4 [IU] via SUBCUTANEOUS
  Administered 2015-02-25: 3 [IU] via SUBCUTANEOUS
  Administered 2015-02-25: 4 [IU] via SUBCUTANEOUS

## 2015-02-20 MED ORDER — DEXTROSE 5 % IV SOLN
1.0000 g | INTRAVENOUS | Status: DC
  Administered 2015-02-21 – 2015-02-23 (×3): 1 g via INTRAVENOUS
  Filled 2015-02-20 (×3): qty 10

## 2015-02-20 MED ORDER — LISINOPRIL-HYDROCHLOROTHIAZIDE 20-25 MG PO TABS: 1.0000 | ORAL_TABLET | Freq: Every day | ORAL | Status: DC

## 2015-02-20 MED ORDER — DEXTROSE 5 % IV SOLN
1.0000 g | Freq: Once | INTRAVENOUS | Status: AC
  Administered 2015-02-20: 1 g via INTRAVENOUS
  Filled 2015-02-20: qty 10

## 2015-02-20 MED ORDER — LISINOPRIL-HYDROCHLOROTHIAZIDE 20-25 MG PO TABS: 1.0000 | ORAL_TABLET | Freq: Once | ORAL | Status: DC

## 2015-02-20 MED ORDER — MONTELUKAST SODIUM 10 MG PO TABS
10.0000 mg | ORAL_TABLET | Freq: Every day | ORAL | Status: DC
  Administered 2015-02-20 – 2015-02-24 (×5): 10 mg via ORAL
  Filled 2015-02-20 (×5): qty 1

## 2015-02-20 MED ORDER — BENEFIBER PO POWD: Freq: Every day | ORAL | Status: DC

## 2015-02-20 MED ORDER — SODIUM CHLORIDE 0.9 % IV BOLUS (SEPSIS)
500.0000 mL | Freq: Once | INTRAVENOUS | Status: AC
  Administered 2015-02-20: 500 mL via INTRAVENOUS

## 2015-02-20 MED ORDER — SODIUM CHLORIDE 0.9 % IV SOLN
INTRAVENOUS | Status: DC
  Administered 2015-02-20: 17:00:00 via INTRAVENOUS

## 2015-02-20 MED ORDER — BUDESONIDE-FORMOTEROL FUMARATE 160-4.5 MCG/ACT IN AERO
2.0000 | INHALATION_SPRAY | Freq: Two times a day (BID) | RESPIRATORY_TRACT | Status: DC
  Administered 2015-02-21 – 2015-02-25 (×7): 2 via RESPIRATORY_TRACT
  Filled 2015-02-20: qty 6

## 2015-02-20 MED ORDER — ALBUTEROL SULFATE (2.5 MG/3ML) 0.083% IN NEBU: 2.5000 mg | INHALATION_SOLUTION | RESPIRATORY_TRACT | Status: DC | PRN

## 2015-02-20 MED ORDER — INSULIN ASPART 100 UNIT/ML ~~LOC~~ SOLN: 0.0000 [IU] | Freq: Every day | SUBCUTANEOUS | Status: DC

## 2015-02-20 MED ORDER — ATORVASTATIN CALCIUM 40 MG PO TABS
40.0000 mg | ORAL_TABLET | Freq: Every day | ORAL | Status: DC
  Administered 2015-02-22: 40 mg via ORAL
  Filled 2015-02-20 (×2): qty 1

## 2015-02-20 MED ORDER — DEXTROSE 5 % IV SOLN
500.0000 mg | Freq: Once | INTRAVENOUS | Status: AC
  Administered 2015-02-20: 500 mg via INTRAVENOUS
  Filled 2015-02-20: qty 500

## 2015-02-20 MED ORDER — PROMETHAZINE HCL 25 MG/ML IJ SOLN
25.0000 mg | Freq: Once | INTRAMUSCULAR | Status: AC
  Administered 2015-02-20: 25 mg via INTRAVENOUS
  Filled 2015-02-20: qty 1

## 2015-02-20 MED ORDER — FUROSEMIDE 40 MG PO TABS
40.0000 mg | ORAL_TABLET | Freq: Two times a day (BID) | ORAL | Status: DC
  Administered 2015-02-20 – 2015-02-22 (×3): 40 mg via ORAL
  Filled 2015-02-20 (×4): qty 1

## 2015-02-20 MED ORDER — DICYCLOMINE HCL 20 MG PO TABS
20.0000 mg | ORAL_TABLET | Freq: Four times a day (QID) | ORAL | Status: DC | PRN
  Filled 2015-02-20: qty 1

## 2015-02-20 MED ORDER — FLUOXETINE HCL 40 MG PO CAPS: 40.0000 mg | ORAL_CAPSULE | Freq: Every day | ORAL | Status: DC

## 2015-02-20 MED ORDER — ALBUTEROL SULFATE HFA 108 (90 BASE) MCG/ACT IN AERS: 2.0000 | INHALATION_SPRAY | RESPIRATORY_TRACT | Status: DC | PRN

## 2015-02-20 MED ORDER — DEXTROSE 5 % IV SOLN
500.0000 mg | INTRAVENOUS | Status: DC
  Administered 2015-02-21 – 2015-02-23 (×3): 500 mg via INTRAVENOUS
  Filled 2015-02-20 (×3): qty 500

## 2015-02-20 MED ORDER — ENOXAPARIN SODIUM 40 MG/0.4ML ~~LOC~~ SOLN
40.0000 mg | SUBCUTANEOUS | Status: DC
  Administered 2015-02-20: 40 mg via SUBCUTANEOUS
  Filled 2015-02-20: qty 0.4

## 2015-02-20 MED ORDER — PANTOPRAZOLE SODIUM 40 MG PO TBEC
80.0000 mg | DELAYED_RELEASE_TABLET | Freq: Every day | ORAL | Status: DC
  Filled 2015-02-20: qty 2

## 2015-02-20 NOTE — H&P (Signed)
Family Medicine Teaching [facility] Admission History and Physical Service Pager: [phone]  Patient name: [name] Medical record number: [id number] Date of birth: 1965-07-06 Age: 51 y.o. Gender: female  Primary Care Provider: Devota Pace, MD Consultants: none Code Status: full  Chief Complaint: nausea  Assessment and Plan: [name] is a 50 y.o. female presenting with chest pain and nausea. PMH is significant for myasthenia, sarcoid, HTN, DM, pulmonary fibrosis.   Chest pain: unable to get details from sedated patient, allegedly was pleuritic, CTA w/o PE. Pain likely from CAP as below. - EKG and trop negative x1 in ED - cycle troponins - repeat EKG in am - check on patient when more alert to get better history of her pain  CAP: Multifocal PNA on CTA, no PE, dyspnea and hypoxia to mid 80s. No documented fevers. WBC 14.8 - admit to inpatient, attending Dr. McDiarmid - s/p ctx and azithro in ED, will continue both for now but likely can stop ctx when she improves clinically - supplemental O2 prn - will need outpatient chest xray in 3-4 weeks to confirm resolution of lung findings - repeat cbc, bmp in am - send flu, strep and legionella antigens - continue home albuterol prn, symbicort bid - consider pulm consult given unclear history of lung disease and CT findings, especially if not improving rapidly on antibiotics  Nausea: Refusing meds due to nausea, still nauseated after zofran, phenergan, ativan. Abdomen benign on exam. Normal abdomen/pelvis CT. - unclear etiology, not vomiting - gentle fluids and PO as tolerated, may need to give more IVF if not tolerating PO  DM: A1c 7.8, on metformin at home - hold metformin while inpatient - resistant SSI given morbidly obese and on chronic steroids  Sarcoid/pulmonary fibrosis/asthma?: chronic prednisone 10mg  daily, unclear exact nature of lung disease - continue home prednisone, will not stress dose now as quite  hypertensive, if she gets sicker would reconsider - continue home symbicort, singulair  HTN:  - continue home lisinopril, hctz, lasix   FEN/GI: NS 38mL/hr, carb mod diet Prophylaxis: lovenox  Disposition: admit to inpatient, med surg, likely home pending clinic improvement  History of Present Illness:  [name] is a 50 y.o. female presenting with chest pain and nausea. I was unable to get a history due to patient sedation and only grunting in response to my questions. Per ED she complained of above but never vomited and their history was also limited by histrionic behavior.  Review Of Systems: Per HPI with the following additions: unable to obtain due to sedation Otherwise the remainder of the systems were negative.  Patient Active Problem List   Diagnosis Date Noted  . CAP (community acquired pneumonia) 02/20/2015  . Rib pain on left side 06/02/2014  . Encounter for chronic pain management 02/11/2014  . Diabetes (HCC) 02/11/2014  . Abdominal pain, epigastric   . Hematochezia   . Internal and external bleeding hemorrhoids   . Chest pain 11/06/2013  . Blood in stool 11/06/2013  . ANA titer 1:40 11/06/2013  . Diarrhea 10/16/2013  . Shortness of breath 10/06/2013  . DOE (dyspnea on exertion) 10/06/2013  . Cough 05/26/2013  . Rash 12/27/2012  . Cystocele 09/14/2012  . Contraceptive management 08/15/2012  . Headache(784.0) 11/02/2011  . Sarcoid (HCC) 12/23/2010  . Knee pain, bilateral 05/28/2010  . 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  . GLUCOSE INTOLERANCE 10/07/2008  . VOCAL CORD DISORDER 09/26/2008  . ALLERGIC RHINITIS 05/30/2008  . OBESITY HYPOVENTILATION SYNDROME  12/27/2007  . Unspecified asthma(493.90) 12/01/2007  . 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 OSA (obstructive sleep apnea) 10/30/2007  . Morbid obesity (HCC) 04/10/2007  . MYASTHENIA 03/07/2007  . SYMPTOM, INCONTINENCE, MIXED, URGE/STRESS 05/30/2006  . DEPRESSIVE  DISORDER, NOS 03/24/2006  . HYPERTENSION, BENIGN SYSTEMIC 03/24/2006  . GASTROESOPHAGEAL REFLUX, NO ESOPHAGITIS 03/24/2006    Past Medical History: Past Medical History  Diagnosis Date  . Myasthenia gravis 1994    Positive Acetylcholine receptor Ab and single fiber EMG Dr. Clarisa [facility]- Dr. Noreene Filbert 1998, Dr Sharene Skeans 2008 [facility]- Failed prednisone due to weight gain, stopped imuran/mestinon due to finances- Now with quiescent disease per Dr Sharene Skeans and no flares in many years  . 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 Coralyn Helling- 10/30/2007 ANA positive, ANA titer  negative, RF less than 20  . ALLERGIC RHINITIS     takes Zyrtec daily  . OSA (obstructive sleep apnea)     RDI 10 on PSG 10/09  . Hypoventilation associated with obesity syndrome (HCC)   . Pelvic inflammatory disease (PID)   . Viral meningitis     history of viral meningitis  . Irritable bladder   . Hypertension     takes Prinizide daily and Clonidine on Mondays  . Asthma     Albuterol prn;Symbicort daily and Singulair daily  . Shortness of breath     can be sitting as well as exertion  . Pneumonia     hx of;last time about 1-68yrs ago  . Bronchitis     hx of  . Headache(784.0)     couple of times a week  . Dizziness     pt states she gets off balance occasionally  . Arthritis   . Joint pain   . Joint swelling   . Myasthenia gravis   . Chronic back pain   . Osteoarthritis   . Rheumatoid arthritis(714.0)   . Carpal tunnel syndrome     right  . Eczema   . Trigger finger   . GERD (gastroesophageal reflux disease)     takes Omeprazole daily  . Peptic ulcer   . Constipation     Miralax prn  . Hemorrhoids   . Urinary frequency     takes Ditropan daily  . Urinary urgency   . Nocturia   . Anxiety   . Depression     takes Cymbalta daily  . Insomnia   . Lung disease     scleraderma  . Skin irritation     skin itchy  . Diabetes (HCC)   .  Gallstones 2010    Past Surgical History: Past Surgical History  Procedure Laterality Date  . Cesarean section  09/15/2004  . Bronchoscopy  08/2007  . Thymus resection  08/25/1993  . Laparoscopic cholecystectomy  07/2008    by Dr. Cyndia Bent  . Cortisone injection      receives an injection every 44months  . Esophagogastroduodenoscopy    . Trigger finger release  05/20/2011    Procedure: RELEASE TRIGGER FINGER/A-1 PULLEY;  Surgeon: [name], MD;  Location: [facility] OR;  Service: Orthopedics;  Laterality: Right;  RIGHT TRIGGER THUMB RELEASE AND RIGHT CARPAL TUNNEL RELEASE  . Carpal tunnel release  05/20/2011    Procedure: CARPAL TUNNEL RELEASE;  Surgeon: [name], MD;  Location: [facility] OR;  Service: Orthopedics;  Laterality: Right;  . Esophagogastroduodenoscopy N/A 01/21/2014    Procedure: ESOPHAGOGASTRODUODENOSCOPY (EGD);  Surgeon: Baldo Ash  Sena Slate, MD;  Location: [facility];  Service: Endoscopy;  Laterality: N/A;  . Colonoscopy N/A 01/21/2014    Procedure: COLONOSCOPY;  Surgeon: Iva Boop, MD;  Location: [facility] ENDOSCOPY;  Service: Endoscopy;  Laterality: N/A;    Social History: Social History  Substance Use Topics  . Smoking status: Never Smoker   . Smokeless tobacco: Never Used  . Alcohol Use: No   Please also refer to relevant sections of EMR.  Family History: Family History  Problem Relation Age of Onset  . Hypertension Father   . Sickle cell trait Father   . Stroke Paternal Grandmother   . Sickle cell trait Paternal Grandfather   . Lupus Other     Mother's first cousin  . Anesthesia problems Neg Hx   . Hypotension Neg Hx   . Malignant hyperthermia Neg Hx   . Pseudochol deficiency Neg Hx   . Crohn's disease Paternal Aunt   . Diabetes Father     Borderline  . Diabetes Paternal Grandfather   . Diabetes Maternal Grandmother   . Colon cancer Neg Hx   . Colon polyps Neg Hx   . Heart disease Neg Hx   . Kidney disease Neg Hx   . Esophageal  cancer Neg Hx   . Gallbladder disease Neg Hx    Allergies and Medications: Allergies  Allergen Reactions  . Apple Other (See Comments)    "mouth itch" - lumps on tongue  . Banana Other (See Comments)    "whelps on tongue"  . Shrimp [Shellfish Allergy] Nausea And Vomiting and Swelling   No current facility-administered medications on file prior to encounter.   Current Outpatient Prescriptions on File Prior to Encounter  Medication Sig Dispense Refill  . albuterol (PROAIR HFA) 108 (90 BASE) MCG/ACT inhaler Inhale 2 puffs into the lungs every 4 (four) hours as needed. 8.5 Inhaler 2  . atorvastatin (LIPITOR) 40 MG tablet Take 1 tablet (40 mg total) by mouth daily. 30 tablet 3  . budesonide-formoterol (SYMBICORT) 160-4.5 MCG/ACT inhaler Inhale 2 puffs into the lungs 2 (two) times daily as needed (for shortness of breath). 1 Inhaler 11  . cetirizine (ZYRTEC) 10 MG tablet Take 1 tablet (10 mg total) by mouth daily. 30 tablet 11  . cloNIDine (CATAPRES - DOSED IN MG/24 HR) 0.3 mg/24hr patch Place 1 patch (0.3 mg total) onto the skin once a week. Change on Friday. 4 patch 5  . cloNIDine (CATAPRES) 0.1 MG tablet TAKE 1 TABLET (0.1 MG TOTAL) BY MOUTH 3 (THREE) TIMES DAILY. ONLY TO BE TAKEN IF PATCH FALLS OFF 30 tablet 1  . dexamethasone (DECADRON) 4 MG tablet Take 2 mg by mouth daily as needed (arthritic flares).    Marland Kitchen dicyclomine (BENTYL) 20 MG tablet Take 1 tablet (20 mg total) by mouth every 6 (six) hours as needed for spasms (abdominal pain). 120 tablet 5  . Elastic Bandages & Supports (SLIP-ON KNEE BRACE) MISC Please evaluate/measure and dispense appropriate braces for severe DJD. Call [phone] with any questions. 2 each 0  . etodolac (LODINE) 400 MG tablet Take 400 mg by mouth 2 (two) times daily.    Marland Kitchen FLUoxetine (PROZAC) 40 MG capsule Take 1 capsule (40 mg total) by mouth daily. 30 capsule 11  . furosemide (LASIX) 20 MG tablet Take 2 tablets (40 mg total) by mouth 2 (two) times daily. 120 tablet  1  . lisinopril-hydrochlorothiazide (PRINZIDE,ZESTORETIC) 20-12.5 MG per tablet Take 1 tablet by mouth daily. 30 tablet 11  . medroxyPROGESTERone (DEPO-PROVERA)  150 MG/ML injection Inject 150 mg into the muscle every 3 (three) months.     . metFORMIN (GLUCOPHAGE) 850 MG tablet Take 1 tablet (850 mg total) by mouth 2 (two) times daily with a meal. 180 tablet 1  . montelukast (SINGULAIR) 10 MG tablet Take 1 tablet (10 mg total) by mouth at bedtime. 30 tablet 5  . omeprazole (PRILOSEC) 40 MG capsule TAKE 1 CAPSULE (40 MG TOTAL) BY MOUTH 2 (TWO) TIMES DAILY. 60 capsule 2  . oxybutynin (DITROPAN) 5 MG tablet Take 1 tablet (5 mg total) by mouth 2 (two) times daily. 60 tablet 3  . oxyCODONE (OXY IR/ROXICODONE) 5 MG immediate release tablet Take 1 tablet (5 mg total) by mouth every 8 (eight) hours as needed for severe pain. Do not fill until 30 days after this prescription date. 90 tablet 0  . polyethylene glycol (MIRALAX / GLYCOLAX) packet Take 17 g by mouth daily as needed for mild constipation.    . predniSONE (DELTASONE) 10 MG tablet TAKE 1 TABLET (10 MG TOTAL) BY MOUTH DAILY. 90 tablet 1  . promethazine (PHENERGAN) 12.5 MG tablet Take 1 tablet (12.5 mg total) by mouth every 6 (six) hours as needed for nausea or vomiting. 30 tablet 0  . sulindac (CLINORIL) 200 MG tablet Take 1 tablet (200 mg total) by mouth 2 (two) times daily. 180 tablet 0  . Wheat Dextrin (BENEFIBER) POWD 2 tablespoons each day 730 g 11    Objective: BP 184/102 mmHg  Pulse 78  Temp(Src) 98.4 F (36.9 C) (Oral)  Resp 29  Ht 5\' 4"  (1.626 m)  SpO2 98% Exam: General: morbidly obese female, lying on stretcher asleep, groans in response to questions Eyes: PERRL, EOMI ENTM: MMM, NCAT Neck: no LAD Cardiovascular: distant heart sounds, RRR, no MRG Respiratory: CTAB, normal WOB but desats to mid 80s during exam Abdomen: soft, NT, ND, +BS Skin: no rashes or lesions Neuro: sedated, noncooperative  Labs and Imaging: CBC BMET    Recent Labs Lab 02/20/15 0549  WBC 14.8*  HGB 13.5  HCT 40.9  PLT 322    Recent Labs Lab 02/20/15 0549  NA 142  K 3.2*  CL 102  CO2 22  BUN <5*  CREATININE 0.86  GLUCOSE 226*  CALCIUM 10.6*    ddimer 1.22 BNP 30.1 Trop i 0.00  Dg Chest 2 View  02/20/2015  CLINICAL DATA:  Acute onset of generalized chest and abdominal pain. Shortness of breath and vomiting. Initial encounter. EXAM: CHEST  2 VIEW COMPARISON:  Chest radiograph performed 05/31/2014 FINDINGS: The lungs are well-aerated. Vascular congestion is noted, with mild bilateral atelectasis. There is no evidence of pleural effusion or pneumothorax. The heart is borderline enlarged. The patient is status post median sternotomy. No acute osseous abnormalities are seen. IMPRESSION: Vascular congestion and borderline cardiomegaly, with mild bilateral atelectasis. Electronically Signed   By: Roanna Raider M.D.   On: 02/20/2015 06:21   Ct Angio Chest Pe W/cm &/or Wo Cm  02/20/2015  CLINICAL DATA:  Pt c/o upper midline chest pain, epigastric pain, n/v x 4 days; Omni 350 given^120mL OMNIPAQUE IOHEXOL 350 MG/ML SOLN EXAM: CT ANGIOGRAPHY CHEST CT ABDOMEN AND PELVIS WITH CONTRAST TECHNIQUE: Multidetector CT imaging of the chest was performed using the standard protocol during bolus administration of intravenous contrast. Multiplanar CT image reconstructions and MIPs were obtained to evaluate the vascular anatomy. Multidetector CT imaging of the abdomen and pelvis was performed using the standard protocol during bolus administration of intravenous contrast. CONTRAST:  OMNIPAQUE IOHEXOL 350 MG/ML SOLN COMPARISON:  Chest x-ray 02/20/2015 FINDINGS: CT CHEST FINDINGS Heart: Mild coronary artery calcifications are present. Heart size is normal. No pericardial effusion. Vascular structures: Pulmonary arteries are well opacified. There is no acute pulmonary embolus. Mediastinum/thyroid: The visualized portion of the thyroid gland has a  normal appearance. There small bilateral hilar lymph nodes, measuring on the order of 1 cm in diameter. Pretracheal lymph node is 1.3 cm in diameter. Lungs/Airways: There are multiple areas of airspace filling throughout the lungs bilaterally. Some these areas are somewhat discrete and warrant follow-up to exclude sub solid masses. However multifocal infectious process is favored. Chest wall/osseous: Status post median sternotomy. No suspicious lytic or blastic lesions are identified. CT ABDOMEN AND PELVIS FINDINGS Upper abdomen: There is diffuse low-attenuation of the liver consistent with hepatic steatosis. No focal liver lesions are identified. Gallbladder is surgically absent. No focal abnormality identified within the spleen or pancreas, adrenal glands. Bilateral renal cysts are present. No hydronephrosis. Gastrointestinal tract: The stomach and small bowel loops are normal in appearance. The appendix is well seen and has a normal appearance. Colonic loops are normal in appearance. Pelvis: The uterus is present. No adnexal mass. No free pelvic fluid. Retroperitoneum: No retroperitoneal or mesenteric adenopathy. No evidence for aortic aneurysm. Abdominal wall: Unremarkable. Osseous structures: Unremarkable. Review of the MIP images confirms the above findings. IMPRESSION: 1. Technically adequate exam showing no acute pulmonary embolus. 2. Small, nonspecific mediastinal lymph nodes. 3. Patchy areas of airspace filling throughout the lungs bilaterally. Findings favor multifocal infectious process. Followup PA and lateral chest X-ray is recommended in 3-4 weeks following trial of antibiotic therapy to ensure resolution and exclude underlying malignancy. 4. Hepatic steatosis. 5. Normal appendix. 6. Status post cholecystectomy. Electronically Signed   By: Norva Pavlov M.D.   On: 02/20/2015 10:45   Ct Abdomen Pelvis W Contrast  02/20/2015  CLINICAL DATA:  Pt c/o upper midline chest pain, epigastric pain, n/v x  4 days; Omni 350 given^194mL OMNIPAQUE IOHEXOL 350 MG/ML SOLN EXAM: CT ANGIOGRAPHY CHEST CT ABDOMEN AND PELVIS WITH CONTRAST TECHNIQUE: Multidetector CT imaging of the chest was performed using the standard protocol during bolus administration of intravenous contrast. Multiplanar CT image reconstructions and MIPs were obtained to evaluate the vascular anatomy. Multidetector CT imaging of the abdomen and pelvis was performed using the standard protocol during bolus administration of intravenous contrast. CONTRAST:  OMNIPAQUE IOHEXOL 350 MG/ML SOLN COMPARISON:  Chest x-ray 02/20/2015 FINDINGS: CT CHEST FINDINGS Heart: Mild coronary artery calcifications are present. Heart size is normal. No pericardial effusion. Vascular structures: Pulmonary arteries are well opacified. There is no acute pulmonary embolus. Mediastinum/thyroid: The visualized portion of the thyroid gland has a normal appearance. There small bilateral hilar lymph nodes, measuring on the order of 1 cm in diameter. Pretracheal lymph node is 1.3 cm in diameter. Lungs/Airways: There are multiple areas of airspace filling throughout the lungs bilaterally. Some these areas are somewhat discrete and warrant follow-up to exclude sub solid masses. However multifocal infectious process is favored. Chest wall/osseous: Status post median sternotomy. No suspicious lytic or blastic lesions are identified. CT ABDOMEN AND PELVIS FINDINGS Upper abdomen: There is diffuse low-attenuation of the liver consistent with hepatic steatosis. No focal liver lesions are identified. Gallbladder is surgically absent. No focal abnormality identified within the spleen or pancreas, adrenal glands. Bilateral renal cysts are present. No hydronephrosis. Gastrointestinal tract: The stomach and small bowel loops are normal in appearance. The appendix is well seen and  has a normal appearance. Colonic loops are normal in appearance. Pelvis: The uterus is present. No adnexal mass.  No free pelvic fluid. Retroperitoneum: No retroperitoneal or mesenteric adenopathy. No evidence for aortic aneurysm. Abdominal wall: Unremarkable. Osseous structures: Unremarkable. Review of the MIP images confirms the above findings. IMPRESSION: 1. Technically adequate exam showing no acute pulmonary embolus. 2. Small, nonspecific mediastinal lymph nodes. 3. Patchy areas of airspace filling throughout the lungs bilaterally. Findings favor multifocal infectious process. Followup PA and lateral chest X-ray is recommended in 3-4 weeks following trial of antibiotic therapy to ensure resolution and exclude underlying malignancy. 4. Hepatic steatosis. 5. Normal appendix. 6. Status post cholecystectomy. Electronically Signed   By: Norva Pavlov M.D.   On: 02/20/2015 10:45    Abram Sander, MD 02/20/2015, 2:11 PM PGY-3, [facility] FPTS Intern pager: [phone], text pages welcome

## 2015-02-20 NOTE — ED Notes (Signed)
PA at bedside.

## 2015-02-20 NOTE — Progress Notes (Signed)
Pt. Has arrived to [facility] [address]. Report received from Paint in the ED. Oriented to unit and equipment. Pt resting comfortably.

## 2015-02-20 NOTE — ED Notes (Signed)
Myself, Nehemiah Settle, Charity fundraiser and paramedic student assisted Redmond Baseman, RN with Corporate investment banker and patient's gown; readjusted patient on stretcher and placed patient in a gown; visitors at bedside

## 2015-02-20 NOTE — ED Notes (Signed)
Patient transported to X-ray 

## 2015-02-20 NOTE — Progress Notes (Addendum)
FPTS Interim Progress Note  Stopped by to see patient, who was admitted earlier this morning. Patient reports that breathing is stable.  She continues to be drowsy.  Voices no concerns at this time.  O: BP 183/111 mmHg  Pulse 82  Temp(Src) 99.1 F (37.3 C) (Oral)  Resp 18  Ht 5\' 4"  (1.626 m)  SpO2 99%  Gen: lying on side in bed, NAD, drowsy appearing Pulm: globally decreased breath sounds, seemingly without wheeze or crackles but difficult to assess 2/2 habitus, breathing normally on room air Neuro: responds to questions but appears drowsy on exam.  A/P: Ethelean A Granlund is a 50 y.o. female here with multifocal pna.  She remains drowsy but does respond to questioning.  Trop neg x1.  CTA with no evidence of PE. - Continue Abx - Monitor respiratory status. - Monitor for further sedation.  53, DO 02/20/2015, 4:27 PM PGY-2, [facility] Family Medicine Service pager [phone]

## 2015-02-20 NOTE — ED Provider Notes (Signed)
CSN: [id number]     Arrival date & time 02/20/15  9485 History   First MD Initiated Contact with Patient 02/20/15 [id number]     Chief Complaint  Patient presents with  . Shortness of Breath  . Chest Pain  . Nausea     (Consider location/radiation/quality/duration/timing/severity/associated sxs/prior Treatment) Patient is a 50 y.o. female presenting with shortness of breath and chest pain.  Shortness of Breath Associated symptoms: chest pain   Chest Pain Associated symptoms: shortness of breath    [name] is a 50 year old female with a past medical history of asthma, OSA and morbid obesity who presents to the ED complaining of SOB and CP. She said her symptoms began Friday and have been getting progressively worse over time and with deep inspiration. Patient also complains of N/V 10 times over the last day, unsure if there was blood in the emesis. Patient was uncooperative with the rest of the interview. She would not answer questions and just kept saying "please" repeatedly while fidgeting in the bed. Past Medical History  Diagnosis Date  . Myasthenia gravis 1994    Positive Acetylcholine receptor Ab and single fiber EMG Dr. Clarisa [facility]red [facility] 1996- Dr. Noreene Filbert 1998, Dr Sharene Skeans 2008 [facility]- Failed prednisone due to weight gain, stopped imuran/mestinon due to finances- Now with quiescent disease per Dr Sharene Skeans and no flares in many years  . 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 Coralyn Helling- 10/30/2007 ANA positive, ANA titer  negative, RF less than 20  . ALLERGIC RHINITIS     takes Zyrtec daily  . OSA (obstructive sleep apnea)     RDI 10 on PSG 10/09  . Hypoventilation associated with obesity syndrome (HCC)   . Pelvic inflammatory disease (PID)   . Viral meningitis     history of viral meningitis  . Irritable bladder   . Hypertension     takes Prinizide daily and Clonidine on Mondays  . Asthma     Albuterol prn;Symbicort daily  and Singulair daily  . Shortness of breath     can be sitting as well as exertion  . Pneumonia     hx of;last time about 1-95yrs ago  . Bronchitis     hx of  . Headache(784.0)     couple of times a week  . Dizziness     pt states she gets off balance occasionally  . Arthritis   . Joint pain   . Joint swelling   . Myasthenia gravis   . Chronic back pain   . Osteoarthritis   . Rheumatoid arthritis(714.0)   . Carpal tunnel syndrome     right  . Eczema   . Trigger finger   . GERD (gastroesophageal reflux disease)     takes Omeprazole daily  . Peptic ulcer   . Constipation     Miralax prn  . Hemorrhoids   . Urinary frequency     takes Ditropan daily  . Urinary urgency   . Nocturia   . Anxiety   . Depression     takes Cymbalta daily  . Insomnia   . Lung disease     scleraderma  . Skin irritation     skin itchy  . Diabetes (HCC)   . Gallstones 2010   Past Surgical History  Procedure Laterality Date  . Cesarean section  09/15/2004  . Bronchoscopy  08/2007  . Thymus resection  08/25/1993  . Laparoscopic cholecystectomy  07/2008    by Dr. Cyndia Bent  . Cortisone injection      receives an injection every 42months  . Esophagogastroduodenoscopy    . Trigger finger release  05/20/2011    Procedure: RELEASE TRIGGER FINGER/A-1 PULLEY;  Surgeon: [name], MD;  Location: [facility] OR;  Service: Orthopedics;  Laterality: Right;  RIGHT TRIGGER THUMB RELEASE AND RIGHT CARPAL TUNNEL RELEASE  . Carpal tunnel release  05/20/2011    Procedure: CARPAL TUNNEL RELEASE;  Surgeon: [name], MD;  Location: [facility] OR;  Service: Orthopedics;  Laterality: Right;  . Esophagogastroduodenoscopy N/A 01/21/2014    Procedure: ESOPHAGOGASTRODUODENOSCOPY (EGD);  Surgeon: Iva Boop, MD;  Location: [facility];  Service: Endoscopy;  Laterality: N/A;  . Colonoscopy N/A 01/21/2014    Procedure: COLONOSCOPY;  Surgeon: Iva Boop, MD;  Location: [facility] ENDOSCOPY;  Service:  Endoscopy;  Laterality: N/A;   Family History  Problem Relation Age of Onset  . Hypertension Father   . Sickle cell trait Father   . Stroke Paternal Grandmother   . Sickle cell trait Paternal Grandfather   . Lupus Other     Mother's first cousin  . Anesthesia problems Neg Hx   . Hypotension Neg Hx   . Malignant hyperthermia Neg Hx   . Pseudochol deficiency Neg Hx   . Crohn's disease Paternal Aunt   . Diabetes Father     Borderline  . Diabetes Paternal Grandfather   . Diabetes Maternal Grandmother   . Colon cancer Neg Hx   . Colon polyps Neg Hx   . Heart disease Neg Hx   . Kidney disease Neg Hx   . Esophageal cancer Neg Hx   . Gallbladder disease Neg Hx    Social History  Substance Use Topics  . Smoking status: Never Smoker   . Smokeless tobacco: Never Used  . Alcohol Use: No   OB History    No data available     Review of Systems  Respiratory: Positive for shortness of breath.   Cardiovascular: Positive for chest pain.   level V caveat applies due to uncooperativeness    Allergies  Apple; Banana; and Shrimp  Home Medications   Prior to Admission medications   Medication Sig Start Date End Date Taking? Authorizing Provider  albuterol (PROAIR HFA) 108 (90 BASE) MCG/ACT inhaler Inhale 2 puffs into the lungs every 4 (four) hours as needed. 11/11/14  Yes Yolande Jolly, MD  atorvastatin (LIPITOR) 40 MG tablet Take 1 tablet (40 mg total) by mouth daily. 10/08/13  Yes Tommie Sams, DO  budesonide-formoterol (SYMBICORT) 160-4.5 MCG/ACT inhaler Inhale 2 puffs into the lungs 2 (two) times daily as needed (for shortness of breath). 03/26/14  Yes Glori Luis, MD  cetirizine (ZYRTEC) 10 MG tablet Take 1 tablet (10 mg total) by mouth daily. 03/26/14  Yes Glori Luis, MD  cloNIDine (CATAPRES - DOSED IN MG/24 HR) 0.3 mg/24hr patch Place 1 patch (0.3 mg total) onto the skin once a week. Change on Friday. 02/12/15  Yes Hillery Hunter Melancon, MD  cloNIDine (CATAPRES) 0.1 MG  tablet TAKE 1 TABLET (0.1 MG TOTAL) BY MOUTH 3 (THREE) TIMES DAILY. ONLY TO BE TAKEN IF PATCH FALLS OFF 02/12/15  Yes Hillery Hunter Melancon, MD  dexamethasone (DECADRON) 4 MG tablet Take 2 mg by mouth daily as needed (arthritic flares).    Historical Provider, MD  dicyclomine (BENTYL) 20 MG tablet Take 1 tablet (20 mg total) by mouth every 6 (six) hours as  needed for spasms (abdominal pain). 01/21/14   Iva Boop, MD  Elastic Bandages & Supports (SLIP-ON KNEE BRACE) MISC Please evaluate/measure and dispense appropriate braces for severe DJD. Call [phone] with any questions. 07/09/13   Amber Nydia Bouton, MD  etodolac (LODINE) 400 MG tablet Take 400 mg by mouth 2 (two) times daily. 10/08/13   Tommie Sams, DO  FLUoxetine (PROZAC) 40 MG capsule Take 1 capsule (40 mg total) by mouth daily. 01/28/14   Glori Luis, MD  furosemide (LASIX) 20 MG tablet Take 2 tablets (40 mg total) by mouth 2 (two) times daily. 11/11/14   Yolande Jolly, MD  lisinopril-hydrochlorothiazide (PRINZIDE,ZESTORETIC) 20-12.5 MG per tablet Take 1 tablet by mouth daily. 03/26/14   Glori Luis, MD  lisinopril-hydrochlorothiazide (PRINZIDE,ZESTORETIC) 20-25 MG tablet Take 1 tablet by mouth daily. 02/12/15   Yolande Jolly, MD  medroxyPROGESTERone (DEPO-PROVERA) 150 MG/ML injection Inject 150 mg into the muscle every 3 (three) months.     Historical Provider, MD  metFORMIN (GLUCOPHAGE) 850 MG tablet Take 1 tablet (850 mg total) by mouth 2 (two) times daily with a meal. 11/11/14   Hillery Hunter Melancon, MD  montelukast (SINGULAIR) 10 MG tablet Take 1 tablet (10 mg total) by mouth at bedtime. 11/11/14   Hillery Hunter Melancon, MD  omeprazole (PRILOSEC) 40 MG capsule TAKE 1 CAPSULE (40 MG TOTAL) BY MOUTH 2 (TWO) TIMES DAILY. 07/15/14   Glori Luis, MD  oxybutynin (DITROPAN) 5 MG tablet Take 1 tablet (5 mg total) by mouth 2 (two) times daily. 09/24/14   Yolande Jolly, MD  oxyCODONE (OXY IR/ROXICODONE) 5 MG immediate release tablet Take 1  tablet (5 mg total) by mouth every 8 (eight) hours as needed for severe pain. 02/12/15   Yolande Jolly, MD  oxyCODONE (OXY IR/ROXICODONE) 5 MG immediate release tablet Take 1 tablet (5 mg total) by mouth every 8 (eight) hours as needed for severe pain. Do not fill until 30 days after this prescription date. 02/12/15   Yolande Jolly, MD  oxyCODONE (OXY IR/ROXICODONE) 5 MG immediate release tablet Take 1 tablet (5 mg total) by mouth every 8 (eight) hours as needed for severe pain. Do not fill until 60 days after this prescription date. 02/12/15   Hillery Hunter Melancon, MD  polyethylene glycol (MIRALAX / GLYCOLAX) packet Take 17 g by mouth daily as needed for mild constipation.    Historical Provider, MD  predniSONE (DELTASONE) 10 MG tablet TAKE 1 TABLET (10 MG TOTAL) BY MOUTH DAILY. 12/18/14   Yolande Jolly, MD  promethazine (PHENERGAN) 12.5 MG tablet Take 1 tablet (12.5 mg total) by mouth every 6 (six) hours as needed for nausea or vomiting. 02/19/15   Yolande Jolly, MD  sulindac (CLINORIL) 200 MG tablet Take 1 tablet (200 mg total) by mouth 2 (two) times daily. 02/04/15   Yolande Jolly, MD  Wheat Dextrin (BENEFIBER) POWD 2 tablespoons each day 01/21/14   Iva Boop, MD   BP 166/108 mmHg  Pulse 93  Temp(Src) 98.4 F (36.9 C) (Oral)  Resp 13  Ht 5\' 4"  (1.626 m)  SpO2 100% Physical Exam  Constitutional: She is oriented to person, place, and time. She appears well-developed and well-nourished. No distress.  Morbidly obese AAF rolling back and forth in bed with her eyes closed asking someone to please help her but uncooperative to any questions or directions.  HENT:  Head: Normocephalic and atraumatic.  Mouth/Throat: Oropharynx is clear and moist. No  oropharyngeal exudate.  Eyes: Pupils are equal, round, and reactive to light.  Neck: Normal range of motion. Neck supple.  Cardiovascular: Normal rate, regular rhythm, normal heart sounds and intact distal pulses.  Exam reveals no gallop and  no friction rub.   No murmur heard. Pulmonary/Chest: Effort normal.  Normal breath sounds in anterior fields. Patient unwilling to sit up or roll on side to complete exam  Abdominal: Soft. Bowel sounds are normal. She exhibits no distension. There is no tenderness. There is no rebound.  Neurological: She is alert and oriented to person, place, and time. She exhibits normal muscle tone. Coordination normal.  Skin: Skin is warm and dry.  Psychiatric: Her mood appears anxious.  Nursing note and vitals reviewed.  ED Course  Procedures (including critical care time) Labs Review Labs Reviewed  BASIC METABOLIC PANEL - Abnormal; Notable for the following:    Potassium 3.2 (*)    Glucose, Bld 226 (*)    BUN <5 (*)    Calcium 10.6 (*)    Anion gap 18 (*)    All other components within normal limits  CBC - Abnormal; Notable for the following:    WBC 14.8 (*)    All other components within normal limits  URINALYSIS, ROUTINE W REFLEX MICROSCOPIC (NOT AT [facility]) - Abnormal; Notable for the following:    pH 8.5 (*)    Ketones, ur >80 (*)    All other components within normal limits  D-DIMER, QUANTITATIVE (NOT AT [facility]) - Abnormal; Notable for the following:    D-Dimer, Quant 1.22 (*)    All other components within normal limits  I-STAT VENOUS BLOOD GAS, ED - Abnormal; Notable for the following:    pH, Ven 7.491 (*)    pCO2, Ven 32.7 (*)    Bicarbonate 25.0 (*)    All other components within normal limits  BRAIN NATRIURETIC PEPTIDE  I-STAT TROPOININ, ED    Imaging Review Dg Chest 2 View  02/20/2015  CLINICAL DATA:  Acute onset of generalized chest and abdominal pain. Shortness of breath and vomiting. Initial encounter. EXAM: CHEST  2 VIEW COMPARISON:  Chest radiograph performed 05/31/2014 FINDINGS: The lungs are well-aerated. Vascular congestion is noted, with mild bilateral atelectasis. There is no evidence of pleural effusion or pneumothorax. The heart is borderline enlarged. The patient is  status post median sternotomy. No acute osseous abnormalities are seen. IMPRESSION: Vascular congestion and borderline cardiomegaly, with mild bilateral atelectasis. Electronically Signed   By: Roanna Raider M.D.   On: 02/20/2015 06:21   I have personally reviewed and evaluated these images and lab results as part of my medical decision-making.   EKG Interpretation   Date/Time:  Thursday February 20 2015 05:28:17 EST Ventricular Rate:  82 PR Interval:  139 QRS Duration: 78 QT Interval:  375 QTC Calculation: 438 R Axis:   -8 Text Interpretation:  Sinus rhythm Normal ECG No significant change since  last tracing Confirmed by Bebe Shaggy  MD, Dorinda Hill ([id number]) on 02/20/2015  5:34:21 AM      Patient be admitted to the hospital for further evaluation and care.  She is also going to be treated with IV antibiotics for community-acquired pneumonia   Charlestine Night, PA-C 02/20/15 1541  Pricilla Loveless, MD 02/20/15 [phone]

## 2015-02-20 NOTE — ED Notes (Signed)
Per pt family pt started having chest pain, Sob and nausea and vomiting x 4 days ago; Pt using hand gestures to speak to RN; Pt a&ox 4 on arrival; Pt presents with obvious Sob on arrival; pt continues to moan and yell "Help Me" No other information provided by patient

## 2015-02-21 LAB — CBC
HCT: 44.6 % (ref 36.0–46.0)
HEMOGLOBIN: 14.9 g/dL (ref 12.0–15.0)
MCH: 27.9 pg (ref 26.0–34.0)
MCHC: 33.4 g/dL (ref 30.0–36.0)
MCV: 83.4 fL (ref 78.0–100.0)
Platelets: 313 10*3/uL (ref 150–400)
RBC: 5.35 MIL/uL — AB (ref 3.87–5.11)
RDW: 15.3 % (ref 11.5–15.5)
WBC: 18.8 10*3/uL — AB (ref 4.0–10.5)

## 2015-02-21 LAB — TROPONIN I: TROPONIN I: 0.05 ng/mL — AB (ref <0.031)

## 2015-02-21 LAB — BASIC METABOLIC PANEL
ANION GAP: 13 (ref 5–15)
BUN: 6 mg/dL (ref 6–20)
CALCIUM: 9.1 mg/dL (ref 8.9–10.3)
CO2: 26 mmol/L (ref 22–32)
Chloride: 102 mmol/L (ref 101–111)
Creatinine, Ser: 0.7 mg/dL (ref 0.44–1.00)
GLUCOSE: 197 mg/dL — AB (ref 65–99)
Potassium: 2.8 mmol/L — ABNORMAL LOW (ref 3.5–5.1)
SODIUM: 141 mmol/L (ref 135–145)

## 2015-02-21 LAB — INFLUENZA PANEL BY PCR (TYPE A & B)
H1N1FLUPCR: NOT DETECTED
Influenza A By PCR: NEGATIVE
Influenza B By PCR: NEGATIVE

## 2015-02-21 LAB — GLUCOSE, CAPILLARY
GLUCOSE-CAPILLARY: 180 mg/dL — AB (ref 65–99)
GLUCOSE-CAPILLARY: 157 mg/dL — AB (ref 65–99)
GLUCOSE-CAPILLARY: 153 mg/dL — AB (ref 65–99)
GLUCOSE-CAPILLARY: 151 mg/dL — AB (ref 65–99)

## 2015-02-21 LAB — HIV ANTIBODY (ROUTINE TESTING W REFLEX): HIV SCREEN 4TH GENERATION: NONREACTIVE

## 2015-02-21 LAB — STREP PNEUMONIAE URINARY ANTIGEN: Strep Pneumo Urinary Antigen: NEGATIVE

## 2015-02-21 MED ORDER — PANTOPRAZOLE SODIUM 40 MG IV SOLR
40.0000 mg | INTRAVENOUS | Status: DC
  Administered 2015-02-21 – 2015-02-23 (×3): 40 mg via INTRAVENOUS
  Filled 2015-02-21 (×3): qty 40

## 2015-02-21 MED ORDER — PROMETHAZINE HCL 25 MG/ML IJ SOLN
12.5000 mg | Freq: Four times a day (QID) | INTRAMUSCULAR | Status: DC | PRN
  Administered 2015-02-21 – 2015-02-22 (×3): 12.5 mg via INTRAVENOUS
  Filled 2015-02-21 (×3): qty 1

## 2015-02-21 MED ORDER — KETOROLAC TROMETHAMINE 15 MG/ML IJ SOLN
15.0000 mg | Freq: Three times a day (TID) | INTRAMUSCULAR | Status: DC
  Administered 2015-02-21 – 2015-02-22 (×5): 15 mg via INTRAVENOUS
  Filled 2015-02-21 (×5): qty 1

## 2015-02-21 MED ORDER — PROMETHAZINE HCL 25 MG/ML IJ SOLN: 12.5000 mg | Freq: Four times a day (QID) | INTRAMUSCULAR | Status: DC | PRN

## 2015-02-21 MED ORDER — SODIUM CHLORIDE 0.9 % IV SOLN: INTRAVENOUS | Status: AC

## 2015-02-21 MED ORDER — ONDANSETRON HCL 4 MG/2ML IJ SOLN
4.0000 mg | Freq: Four times a day (QID) | INTRAMUSCULAR | Status: DC | PRN
  Administered 2015-02-22: 4 mg via INTRAVENOUS
  Filled 2015-02-21: qty 2

## 2015-02-21 MED ORDER — ONDANSETRON HCL 4 MG/2ML IJ SOLN
4.0000 mg | Freq: Three times a day (TID) | INTRAMUSCULAR | Status: DC | PRN
  Administered 2015-02-21: 4 mg via INTRAVENOUS
  Filled 2015-02-21: qty 2

## 2015-02-21 MED ORDER — POTASSIUM CHLORIDE CRYS ER 20 MEQ PO TBCR
40.0000 meq | EXTENDED_RELEASE_TABLET | Freq: Two times a day (BID) | ORAL | Status: DC
  Filled 2015-02-21: qty 2

## 2015-02-21 MED ORDER — ENOXAPARIN SODIUM 80 MG/0.8ML ~~LOC~~ SOLN
0.5000 mg/kg | SUBCUTANEOUS | Status: DC
  Administered 2015-02-21 – 2015-02-24 (×4): 80 mg via SUBCUTANEOUS
  Filled 2015-02-21 (×4): qty 0.8

## 2015-02-21 MED ORDER — PREDNISONE 20 MG PO TABS
20.0000 mg | ORAL_TABLET | Freq: Every day | ORAL | Status: DC
  Administered 2015-02-22 – 2015-02-23 (×2): 20 mg via ORAL
  Filled 2015-02-21 (×2): qty 1

## 2015-02-21 MED ORDER — POTASSIUM CHLORIDE 10 MEQ/100ML IV SOLN
10.0000 meq | INTRAVENOUS | Status: AC
  Administered 2015-02-21 (×4): 10 meq via INTRAVENOUS
  Filled 2015-02-21 (×4): qty 100

## 2015-02-21 NOTE — Progress Notes (Signed)
Patient IV's out when RN came to room. Patient was slightly upset after IV team started new IV access. Patient refused her Vital signs and EKG this morning, stating "I want to be left alone. I'm tired. Let's me rest." Bed in lowest position, call bell within reach, bed alarm was on. No other needs expressed at the moment.

## 2015-02-21 NOTE — Care Management Note (Signed)
Case Management Note  Patient Details  Name: [name] MRN: [id number] Date of Birth: Oct 05, 1965  Subjective/Objective:              Presenting with chest pain and nausea. Hx of myasthenia, sarcoid, HTN, DM, pulmonary fibrosis. Resides with children. Independent with ADL's. No home DME.       Action/Plan:  Return to home when medically stable. CM to f/u with disposition needs.  Expected Discharge Date:                  Expected Discharge Plan:  Home/Self Care  In-House Referral:     Discharge planning Services  CM Consult  Post Acute Care Choice:    Choice offered to:     DME Arranged:    DME Agency:     HH Arranged:    HH Agency:     Status of Service:  In process, will continue to follow  Medicare Important Message Given:    Date Medicare IM Given:    Medicare IM give by:    Date Additional Medicare IM Given:    Additional Medicare Important Message give by:     If discussed at Long Length of Stay Meetings, dates discussed:    Additional Comments: Agapito Games (Spouse) [phone], Marquis Buggy (Daughter) [phone]  Gae Gallop [place], [place] [phone] 02/21/2015, 1:48 PM

## 2015-02-21 NOTE — Progress Notes (Addendum)
UR COMPLETED  

## 2015-02-21 NOTE — Progress Notes (Signed)
Family Medicine Teaching Service Daily Progress Note Intern Pager: [phone]  Patient name: [name] Medical record number: [id number] Date of birth: 09-30-1965 Age: 50 y.o. Gender: female  Primary Care Provider: Devota Pace, MD Consultants: none Code Status: full  Pt Overview and Major Events to Date:  01/27- admitted with chest pain  Assessment and Plan: [name] is a 50 y.o. female presenting with chest pain and nausea. PMH is significant for myasthenia, sarcoid, HTN, DM, pulmonary fibrosis.   Chest pain likely from CAP: still with sharp pain on the right side. Dyspnea and hypoxia to mid 80s on presentation. Satting in upper 90's on room air. No documented fevers. CTA w/o PE but multifocal pneumonia. CXR with vascular congestion and borderline cardiomegaly, with mild bilateral atelectasis. EKG negative. Trop 0.03>0.03>0.05. Flu negative. WBC 18 this morning (on steroid).  - s/p ctx and azithro in ED,  - continue CTX and Azithromycin.  - supplemental O2 prn - will need outpatient chest xray in 3-4 weeks to confirm resolution of lung findings - f/u flu, strep and legionella antigens - continue home albuterol prn, symbicort twice a day and singulair  N/V/D: still nauseous and vomiting. No diarrhea here. Urine ketone >80 on arrival. No glucosuria. CTabdomen/pelvis with hepatic steatosis otherwise normal. UDS positive for opiates (has rx). Normal colonoscopy and EGD in 2015. History of IBS. Abdominal exam with some epigastric discomfort on palpation. s/p 500 ml of NS bolus in ED, and on @ 50 ml/hr overnight. -Continue IVF at low rate given vascular congestion on CXR. -Zofran IV -Stop promethazine  DM: A1c 7.8, on metformin at home. Holding metformin after CTA. Cr 0.7. - resistant SSI given morbidly obese and on chronic steroids  Hypokalemia: K 2.8 this morning. -Replacing with IV potassium -check Mg  Sarcoid/pulmonary fibrosis/asthma?: chronic prednisone 10mg  daily,  unclear exact nature of lung disease. Ca 9.1 this morning.  - continue home prednisone, will not stress dose now as quite hypertensive, if she gets sicker would reconsider - continue home symbicort, singulair  Myasthenia Gravis: on prednisone 10 mg at home. - Increase prednisone to 20 mg  HTN: continues to be hypertensive: on Lisinopril/HCTZ, Furosamide, clonidine pathces at home  - continue home meds  FEN/GI:  -NS 46mL/hr,  -carb mod diet -Protonix  Prophylaxis: lovenox  Disposition: home pending improvement in by mouth intake  Subjective:  Complains about nausea, emesis (greenish-dark) no blood. Had diarrhea at home but not here. Reports epigastric pain and sharp chest pain that radiates laterally under her right breast. Also headache over frontal areas.   Objective: Temp:  [99.1 F (37.3 C)-99.8 F (37.7 C)] 99.8 F (37.7 C) (01/26 2012) Pulse Rate:  [75-105] 105 (01/26 2012) Resp:  [13-29] 18 (01/26 2012) BP: (119-194)/(73-114) 178/99 mmHg (01/26 2012) SpO2:  [95 %-100 %] 97 % (01/26 2012) Weight:  [348 lb 14.4 oz (158.26 kg)] 348 lb 14.4 oz (158.26 kg) (01/27 0138) Physical Exam: Gen: morbidly obese, lying in bed, heaving, sleepy CV: regular rate and rythm. S1 & S2 audible, no murmurs. Resp: no apparent work of breathing, clear to auscultation bilaterally, good air movement. GI: bowel sounds normal, discomfort with palpation over epigastric areas GU: no CVA MSK: no LE edema Laboratory:  Recent Labs Lab 02/20/15 0549 02/21/15 0207  WBC 14.8* 18.8*  HGB 13.5 14.9  HCT 40.9 44.6  PLT 322 313    Recent Labs Lab 02/20/15 0549 02/21/15 0207  NA 142 141  K 3.2* 2.8*  CL 102 102  CO2 22 26  BUN <5* 6  CREATININE 0.86 0.70  CALCIUM 10.6* 9.1  GLUCOSE 226* 197*    Imaging/Diagnostic Tests: Ct Angio Chest Pe W/cm &/or Wo Cm  02/20/2015  Technically adequate exam showing no acute pulmonary embolus. 2. Small, nonspecific mediastinal lymph nodes. 3. Patchy  areas of airspace filling throughout the lungs bilaterally. Findings favor multifocal infectious process. Followup PA and lateral chest X-ray is recommended in 3-4 weeks following trial of antibiotic therapy to ensure resolution and exclude underlying malignancy. 4. Hepatic steatosis. 5. Normal appendix. 6. Status post cholecystectomy. Electronically Signed   By: Norva Pavlov M.D.   On: 02/20/2015 10:45   Ct Abdomen Pelvis W Contrast  02/20/2015  Technically adequate exam showing no acute pulmonary embolus. 2. Small, nonspecific mediastinal lymph nodes. 3. Patchy areas of airspace filling throughout the lungs bilaterally. Findings favor multifocal infectious process. Followup PA and lateral chest X-ray is recommended in 3-4 weeks following trial of antibiotic therapy to ensure resolution and exclude underlying malignancy. 4. Hepatic steatosis. 5. Normal appendix. 6. Status post cholecystectomy. Electronically Signed   By: Norva Pavlov M.D.   On: 02/20/2015 10:45    [name], MD 02/21/2015, 6:52 AM PGY-1, [facility] FPTS Intern pager: [phone], text pages welcome

## 2015-02-22 LAB — CBC WITH DIFFERENTIAL/PLATELET
Basophils Absolute: 0 10*3/uL (ref 0.0–0.1)
Basophils Relative: 0 %
EOS PCT: 0 %
Eosinophils Absolute: 0 10*3/uL (ref 0.0–0.7)
HCT: 45.5 % (ref 36.0–46.0)
Hemoglobin: 15.2 g/dL — ABNORMAL HIGH (ref 12.0–15.0)
LYMPHS ABS: 1.3 10*3/uL (ref 0.7–4.0)
LYMPHS PCT: 8 %
MCH: 27.7 pg (ref 26.0–34.0)
MCHC: 33.4 g/dL (ref 30.0–36.0)
MCV: 83 fL (ref 78.0–100.0)
MONO ABS: 0.9 10*3/uL (ref 0.1–1.0)
Monocytes Relative: 5 %
Neutro Abs: 15.5 10*3/uL — ABNORMAL HIGH (ref 1.7–7.7)
Neutrophils Relative %: 87 %
PLATELETS: 298 10*3/uL (ref 150–400)
RBC: 5.48 MIL/uL — ABNORMAL HIGH (ref 3.87–5.11)
RDW: 15.2 % (ref 11.5–15.5)
WBC: 17.7 10*3/uL — ABNORMAL HIGH (ref 4.0–10.5)

## 2015-02-22 LAB — GLUCOSE, CAPILLARY
Glucose-Capillary: 150 mg/dL — ABNORMAL HIGH (ref 65–99)
Glucose-Capillary: 128 mg/dL — ABNORMAL HIGH (ref 65–99)
Glucose-Capillary: 193 mg/dL — ABNORMAL HIGH (ref 65–99)
Glucose-Capillary: 139 mg/dL — ABNORMAL HIGH (ref 65–99)
Glucose-Capillary: 147 mg/dL — ABNORMAL HIGH (ref 65–99)

## 2015-02-22 LAB — CORTISOL-AM, BLOOD: Cortisol - AM: 3.5 ug/dL — ABNORMAL LOW (ref 6.7–22.6)

## 2015-02-22 LAB — BASIC METABOLIC PANEL
ANION GAP: 14 (ref 5–15)
Anion gap: 16 — ABNORMAL HIGH (ref 5–15)
BUN: 8 mg/dL (ref 6–20)
BUN: 9 mg/dL (ref 6–20)
CALCIUM: 8.3 mg/dL — AB (ref 8.9–10.3)
CALCIUM: 9.3 mg/dL (ref 8.9–10.3)
CO2: 23 mmol/L (ref 22–32)
CO2: 21 mmol/L — ABNORMAL LOW (ref 22–32)
CREATININE: 0.83 mg/dL (ref 0.44–1.00)
Chloride: 105 mmol/L (ref 101–111)
Chloride: 100 mmol/L — ABNORMAL LOW (ref 101–111)
Creatinine, Ser: 0.75 mg/dL (ref 0.44–1.00)
GFR calc Af Amer: >60 mL/min (ref >60)
GLUCOSE: 180 mg/dL — AB (ref 65–99)
Glucose, Bld: 171 mg/dL — ABNORMAL HIGH (ref 65–99)
POTASSIUM: 3.1 mmol/L — AB (ref 3.5–5.1)
Potassium: 2.6 mmol/L — CL (ref 3.5–5.1)
Sodium: 142 mmol/L (ref 135–145)
Sodium: 137 mmol/L (ref 135–145)

## 2015-02-22 LAB — POTASSIUM: Potassium: 2.9 mmol/L — ABNORMAL LOW (ref 3.5–5.1)

## 2015-02-22 LAB — MAGNESIUM: MAGNESIUM: 1.6 mg/dL — AB (ref 1.7–2.4)

## 2015-02-22 MED ORDER — LORAZEPAM 1 MG PO TABS: 1.0000 mg | ORAL_TABLET | Freq: Once | ORAL | Status: DC

## 2015-02-22 MED ORDER — SODIUM CHLORIDE 0.9 % IV SOLN
8.0000 mg | Freq: Three times a day (TID) | INTRAVENOUS | Status: DC
  Administered 2015-02-22 – 2015-02-24 (×5): 8 mg via INTRAVENOUS
  Filled 2015-02-22 (×7): qty 4

## 2015-02-22 MED ORDER — CLONIDINE HCL 0.1 MG PO TABS
0.1000 mg | ORAL_TABLET | Freq: Three times a day (TID) | ORAL | Status: DC
  Administered 2015-02-22 – 2015-02-23 (×3): 0.1 mg via ORAL
  Filled 2015-02-22 (×3): qty 1

## 2015-02-22 MED ORDER — MAGNESIUM SULFATE 2 GM/50ML IV SOLN
2.0000 g | Freq: Every day | INTRAVENOUS | Status: DC
  Administered 2015-02-22 – 2015-02-23 (×2): 2 g via INTRAVENOUS
  Filled 2015-02-22 (×2): qty 50

## 2015-02-22 MED ORDER — LORAZEPAM 2 MG/ML IJ SOLN: 0.5000 mg | Freq: Once | INTRAMUSCULAR | Status: DC

## 2015-02-22 MED ORDER — PROMETHAZINE HCL 25 MG/ML IJ SOLN
25.0000 mg | INTRAMUSCULAR | Status: DC | PRN
  Administered 2015-02-22: 25 mg via INTRAVENOUS
  Filled 2015-02-22: qty 1

## 2015-02-22 MED ORDER — POTASSIUM CHLORIDE CRYS ER 20 MEQ PO TBCR
40.0000 meq | EXTENDED_RELEASE_TABLET | Freq: Two times a day (BID) | ORAL | Status: DC
  Administered 2015-02-22: 40 meq via ORAL
  Filled 2015-02-22: qty 2

## 2015-02-22 MED ORDER — KCL IN DEXTROSE-NACL 40-5-0.9 MEQ/L-%-% IV SOLN
INTRAVENOUS | Status: DC
  Administered 2015-02-22 – 2015-02-24 (×3): via INTRAVENOUS
  Filled 2015-02-22 (×10): qty 1000

## 2015-02-22 MED ORDER — POTASSIUM CHLORIDE CRYS ER 20 MEQ PO TBCR: 40.0000 meq | EXTENDED_RELEASE_TABLET | Freq: Once | ORAL | Status: DC

## 2015-02-22 MED ORDER — ACETAMINOPHEN 325 MG PO TABS
650.0000 mg | ORAL_TABLET | Freq: Four times a day (QID) | ORAL | Status: DC | PRN
  Administered 2015-02-22: 650 mg via ORAL
  Filled 2015-02-22: qty 2

## 2015-02-22 MED ORDER — PROMETHAZINE HCL 25 MG/ML IJ SOLN: 25.0000 mg | Freq: Four times a day (QID) | INTRAMUSCULAR | Status: DC | PRN

## 2015-02-22 NOTE — Progress Notes (Signed)
Pt c/o of new onset pain in her lower abdomen, SOB, feeling cold and clammy, and a feeling of impending doom. Paged MD about pt's symptoms. MD returned page, RN informed MD about pt's new symptoms and MD replied "Just because a pt feels a certain way doesn't mean we need to do anything about it."  MD proceeded to say he would put orders in for pain medicine. Awaiting orders at this time. Will continue to monitor.

## 2015-02-22 NOTE — Progress Notes (Signed)
Family Medicine Teaching Service Daily Progress Note Intern Pager: [phone]  Patient name: [name] Medical record number: [id number] Date of birth: [birth year] Age: 50 y.o. Gender: female  Primary Care Provider: Devota Pace, MD Consultants: none Code Status: full  Pt Overview and Major Events to Date:  01/27- admitted with chest pain  Assessment and Plan: [name] is a 50 y.o. female presenting with chest pain and nausea. PMH is significant for myasthenia, sarcoid, HTN, DM, pulmonary fibrosis.   Chest pain likely from CAP:  CTA w/o PE but multifocal pneumonia. CXR with vascular congestion and borderline cardiomegaly, with mild bilateral atelectasis. EKG negative. Trop 0.03>0.03>0.05. Flu and strep pneumo negative. WBC 18 on 1/28 (on steroid). s/p ctx and azithro in ED. Resolving pain on the right side. Dyspnea and hypoxia to mid 80s on presentation. Satting in upper 90's on room air. No documented fevers.  - continue CTX and Azithromycin (1/26 -  ), transition to PO abx when able - supplemental O2 prn - will need outpatient chest xray in 3-4 weeks to confirm resolution of lung findings - f/u legionella antigens - continue home albuterol prn, symbicort twice a day and singulair  N/V/D: still nauseous and vomiting. No diarrhea here. Urine ketone >80 on arrival. No glucosuria. CTabdomen/pelvis with hepatic steatosis otherwise normal. UDS positive for opiates (has rx). Normal colonoscopy and EGD in 2015. History of IBS. Abdominal exam with some epigastric discomfort on palpation. No fluids overnight, tolerating some PO.  -IVF: D5 NS + K @ 125 cc/hr -Phenergan or Zofran PRN IV for nausea - Gastric emptying study for further assessment of inability to tolerate PO  DM: A1c 7.8, on metformin at home. Holding metformin after CTA. Cr 0.7. - resistant SSI given morbidly obese and on chronic steroids  Hypokalemia and Hypomagnesia: K 2.6 this morning. S/p IV KCl 10 meq x 4  yesterday and 40 meq this AM. Mg low today at 1.6 today.  - K in fluids as above - Replete Magnesium 2 G daily for next 4 days - Daily BMP and Mg  Sarcoid/pulmonary fibrosis/asthma?: chronic prednisone 10mg  daily, unclear exact nature of lung disease. Ca 8.3  - Increased Prednisone to 20 mg daily yesterday - continue home symbicort, singulair  Myasthenia Gravis: on prednisone 10 mg at home. - Increase prednisone to 20 mg  HTN: continues to be hypertensive: on Lisinopril/HCTZ, Furosamide, clonidine patches at home  - Stop Lasix and HCTZ for now - Continue Clonidine and Lisinopril  FEN/GI:  -D5 NS + K @ 125 cc/hr -carb mod diet -Protonix  Prophylaxis: lovenox  Disposition: home pending improvement of PO intake  Subjective:  - Continues to complain about nausea and emesis. Had diarrhea at home but not here.  - Unable to tolerate any PO - Endorses some shortness of breath after moving to chair from bed  Objective: Temp:  [98.7 F (37.1 C)-99.5 F (37.5 C)] 99.5 F (37.5 C) (01/28 0513) Pulse Rate:  [102-123] 115 (01/28 0513) Resp:  [20-22] 20 (01/28 0513) BP: (150-175)/(91-126) 150/91 mmHg (01/28 0513) SpO2:  [96 %-100 %] 96 % (01/28 02-25-1987) Physical Exam: Gen: morbidly obese, sitting up in chair, breathing heavily CV: regular rate and rythm. S1 & S2 audible, no murmurs. Resp: no apparent work of breathing, clear to auscultation bilaterally, good air movement. GI: bowel sounds normal, discomfort with palpation over epigastric areas GU: no CVA MSK: no LE edema Laboratory:  Recent Labs Lab 02/20/15 0549 02/21/15 0207  WBC 14.8* 18.8*  HGB  13.5 14.9  HCT 40.9 44.6  PLT 322 313    Recent Labs Lab 02/20/15 0549 02/21/15 0207 02/22/15 0541  NA 142 141 142  K 3.2* 2.8* 2.6*  CL 102 102 105  CO2 22 26 23   BUN <5* 6 8  CREATININE 0.86 0.70 0.75  CALCIUM 10.6* 9.1 8.3*  GLUCOSE 226* 197* 171*    Imaging/Diagnostic Tests: Ct Angio Chest Pe W/cm &/or Wo  Cm  02/20/2015  Technically adequate exam showing no acute pulmonary embolus. 2. Small, nonspecific mediastinal lymph nodes. 3. Patchy areas of airspace filling throughout the lungs bilaterally. Findings favor multifocal infectious process. Followup PA and lateral chest X-ray is recommended in 3-4 weeks following trial of antibiotic therapy to ensure resolution and exclude underlying malignancy. 4. Hepatic steatosis. 5. Normal appendix. 6. Status post cholecystectomy. Electronically Signed   By: 02/22/2015 M.D.   On: 02/20/2015 10:45   Ct Abdomen Pelvis W Contrast  02/20/2015  Technically adequate exam showing no acute pulmonary embolus. 2. Small, nonspecific mediastinal lymph nodes. 3. Patchy areas of airspace filling throughout the lungs bilaterally. Findings favor multifocal infectious process. Followup PA and lateral chest X-ray is recommended in 3-4 weeks following trial of antibiotic therapy to ensure resolution and exclude underlying malignancy. 4. Hepatic steatosis. 5. Normal appendix. 6. Status post cholecystectomy. Electronically Signed   By: 02/22/2015 M.D.   On: 02/20/2015 10:45    02/22/2015, MD 02/22/2015, 8:45 AM PGY-1, [facility] FPTS Intern pager: [phone], text pages welcome

## 2015-02-23 DIAGNOSIS — R112 Nausea with vomiting, unspecified: Secondary | ICD-10-CM | POA: Insufficient documentation

## 2015-02-23 DIAGNOSIS — R1110 Vomiting, unspecified: Secondary | ICD-10-CM

## 2015-02-23 LAB — COMPREHENSIVE METABOLIC PANEL
ALT: 15 U/L (ref 14–54)
ANION GAP: 11 (ref 5–15)
AST: 19 U/L (ref 15–41)
Albumin: 2.6 g/dL — ABNORMAL LOW (ref 3.5–5.0)
Alkaline Phosphatase: 63 U/L (ref 38–126)
BUN: 10 mg/dL (ref 6–20)
CHLORIDE: 106 mmol/L (ref 101–111)
CO2: 26 mmol/L (ref 22–32)
CREATININE: 1.35 mg/dL — AB (ref 0.44–1.00)
Calcium: 8.4 mg/dL — ABNORMAL LOW (ref 8.9–10.3)
GFR, EST AFRICAN AMERICAN: 53 mL/min — AB (ref >60)
GFR, EST NON AFRICAN AMERICAN: 46 mL/min — AB (ref >60)
Glucose, Bld: 183 mg/dL — ABNORMAL HIGH (ref 65–99)
Potassium: 3.1 mmol/L — ABNORMAL LOW (ref 3.5–5.1)
SODIUM: 143 mmol/L (ref 135–145)
Total Bilirubin: 0.7 mg/dL (ref 0.3–1.2)
Total Protein: 6.1 g/dL — ABNORMAL LOW (ref 6.5–8.1)

## 2015-02-23 LAB — GLUCOSE, CAPILLARY
GLUCOSE-CAPILLARY: 226 mg/dL — AB (ref 65–99)
GLUCOSE-CAPILLARY: 201 mg/dL — AB (ref 65–99)
Glucose-Capillary: 158 mg/dL — ABNORMAL HIGH (ref 65–99)
Glucose-Capillary: 164 mg/dL — ABNORMAL HIGH (ref 65–99)

## 2015-02-23 LAB — LIPASE, BLOOD: LIPASE: 40 U/L (ref 11–51)

## 2015-02-23 LAB — MAGNESIUM: MAGNESIUM: 2.4 mg/dL (ref 1.7–2.4)

## 2015-02-23 MED ORDER — POTASSIUM CHLORIDE CRYS ER 20 MEQ PO TBCR: 40.0000 meq | EXTENDED_RELEASE_TABLET | ORAL | Status: DC

## 2015-02-23 MED ORDER — POTASSIUM CHLORIDE CRYS ER 20 MEQ PO TBCR
40.0000 meq | EXTENDED_RELEASE_TABLET | Freq: Once | ORAL | Status: AC
  Administered 2015-02-23: 40 meq via ORAL
  Filled 2015-02-23: qty 2

## 2015-02-23 MED ORDER — DEXAMETHASONE SODIUM PHOSPHATE 4 MG/ML IJ SOLN
4.0000 mg | INTRAMUSCULAR | Status: DC
  Administered 2015-02-23 – 2015-02-24 (×2): 4 mg via INTRAVENOUS
  Filled 2015-02-23 (×2): qty 1

## 2015-02-23 MED ORDER — DICYCLOMINE HCL 20 MG PO TABS
20.0000 mg | ORAL_TABLET | Freq: Four times a day (QID) | ORAL | Status: DC
  Filled 2015-02-23 (×5): qty 1

## 2015-02-23 MED ORDER — SODIUM CHLORIDE 0.9 % IV BOLUS (SEPSIS)
1000.0000 mL | Freq: Once | INTRAVENOUS | Status: AC
  Administered 2015-02-23: 1000 mL via INTRAVENOUS

## 2015-02-23 NOTE — Progress Notes (Signed)
Pt with bp of 93/51 but asymptomatic. Provider paged. Awaiting further orders. Will continue to monitor pt. Per provider, bp is ok just continue to monitor pt.

## 2015-02-23 NOTE — Progress Notes (Signed)
Family Medicine Teaching Service Daily Progress Note Intern Pager: [phone]  Patient name: [name] Medical record number: [id number] Date of birth: 1965-08-28 Age: 50 y.o. Gender: female  Primary Care Provider: Devota Pace, MD Consultants: none Code Status: full  Pt Overview and Major Events to Date:  01/27- admitted with chest pain  Assessment and Plan: [name] is a 50 y.o. female presenting with chest pain and nausea. PMH is significant for myasthenia, sarcoid, HTN, DM, pulmonary fibrosis.   Chest pain likely from CAP:  CTA w/o PE but multifocal pneumonia. CXR with vascular congestion and borderline cardiomegaly, with mild bilateral atelectasis. EKG negative. Trop 0.03>0.03>0.05. Flu and strep pneumo negative. WBC 18 on 1/28 (on steroid).  Dyspnea and hypoxia to mid 80s on presentation. Satting well on RA. No documented fevers.  - s/p ctx and azithro in ED. - continue CTX and Azithromycin (1/26 - ), consider transitioning to by mouth on 01/30 if continues to be stable. - supplemental O2 prn - will need outpatient chest xray in 3-4 weeks to confirm resolution of lung findings - f/u legionella antigens - continue home albuterol prn, symbicort twice a day and singulair  N/V/D: improved. Urine ketone >80 on arrival. No glucosuria. CTabdomen/pelvis with hepatic steatosis otherwise normal. UDS positive for opiates (has rx). Normal colonoscopy and EGD in 2015. History of IBS. No tenderness to palpation this morning. Hypotensive this morning. No symptoms. Also with AKI. Mg 2.4 today -NS 1L bolus, then D5 NS + K @ 125 cc/hr - Zofran 8 mg q8h - Phenergan 25 mg q4h as needed - On dicyclomine 20 mg q6h as needed - discontinue Mg  - Gastric emptying study for further assessment of inability to tolerate PO  DM: A1c 7.8, on metformin at home. Holding metformin after CTA. - resistant SSI given morbidly obese and on chronic steroids  Hypokalemia and Hypomagnesia: K 3.1 this  AM. Mg 2.4  - K 40 mEq in IVF + K-dur 40 mEq once - stop Mg - am BMP and Mg  Sarcoid/pulmonary fibrosis/asthma?: chronic prednisone 10mg  daily, unclear exact nature of lung disease. Ca 8.3  - Prednisone to 20 mg daily on 01/27>1/28.  - Decadron 4 mg once. Resume home prednisone in three days - continue home symbicort, singulair  Myasthenia Gravis: on prednisone 10 mg at home. - steroid as above  HTN: hypotensive this morning but not symptomatic with ambulation to bathroom. Also with AKI. On Lisinopril/HCTZ, Furosamide, clonidine patches at home  - NS 1L bolus - Stopped Lasix and HCTZ on 01/27 - Continue Clonidine - Hold Lisinopril for low BP and AKI - repeat BP  FEN/GI:  -Bolus NS 1L, then D5 NS + K @ 125 cc/hr -carb mod diet -Protonix  Prophylaxis: lovenox  Disposition: home pending improvement of PO intake  Subjective:  Reports that her nausea and vomiting has improved. She reports that increasing her phenergan has helped a lot. Denies dizziness with ambulation to bathroom this morning. Has her first BM since admission. It is loss but not watery. No sure if there is blood in it.  Objective: Temp:  [98.7 F (37.1 C)-99.9 F (37.7 C)] 98.7 F (37.1 C) (01/29 0512) Pulse Rate:  [96-132] 96 (01/29 0512) Resp:  [12-20] 12 (01/29 0512) BP: (87-171)/(41-119) 87/41 mmHg (01/29 0512) SpO2:  [97 %-100 %] 97 % (01/29 0512) Physical Exam: Gen: morbidly obese, sitting on the edge of the bed without acute distress CV: regular rate and rythm. S1 & S2 audible, no murmurs.  Resp: no apparent work of breathing, clear to auscultation bilaterally, good air movement. GI: bowel sounds normal, no tenderness to palpation GU: no CVA MSK: no LE edema  Laboratory:  Recent Labs Lab 02/20/15 0549 02/21/15 0207 02/22/15 1652  WBC 14.8* 18.8* 17.7*  HGB 13.5 14.9 15.2*  HCT 40.9 44.6 45.5  PLT 322 313 298    Recent Labs Lab 02/21/15 0207 02/22/15 0541 02/22/15 1652 02/22/15 2147   NA 141 142 137  --   K 2.8* 2.6* 3.1* 2.9*  CL 102 105 100*  --   CO2 26 23 21*  --   BUN 6 8 9   --   CREATININE 0.70 0.75 0.83  --   CALCIUM 9.1 8.3* 9.3  --   GLUCOSE 197* 171* 180*  --     Imaging/Diagnostic Tests: Ct Angio Chest Pe W/cm &/or Wo Cm  02/20/2015  Technically adequate exam showing no acute pulmonary embolus. 2. Small, nonspecific mediastinal lymph nodes. 3. Patchy areas of airspace filling throughout the lungs bilaterally. Findings favor multifocal infectious process. Followup PA and lateral chest X-ray is recommended in 3-4 weeks following trial of antibiotic therapy to ensure resolution and exclude underlying malignancy. 4. Hepatic steatosis. 5. Normal appendix. 6. Status post cholecystectomy. Electronically Signed   By: 02/22/2015 M.D.   On: 02/20/2015 10:45   Ct Abdomen Pelvis W Contrast  02/20/2015  Technically adequate exam showing no acute pulmonary embolus. 2. Small, nonspecific mediastinal lymph nodes. 3. Patchy areas of airspace filling throughout the lungs bilaterally. Findings favor multifocal infectious process. Followup PA and lateral chest X-ray is recommended in 3-4 weeks following trial of antibiotic therapy to ensure resolution and exclude underlying malignancy. 4. Hepatic steatosis. 5. Normal appendix. 6. Status post cholecystectomy. Electronically Signed   By: 02/22/2015 M.D.   On: 02/20/2015 10:45    02/22/2015, MD 02/23/2015, 8:26 AM PGY-1, [facility] FPTS Intern pager: [phone], text pages welcome

## 2015-02-24 LAB — BASIC METABOLIC PANEL
Anion gap: 6 (ref 5–15)
BUN: 16 mg/dL (ref 6–20)
CALCIUM: 8.3 mg/dL — AB (ref 8.9–10.3)
CO2: 23 mmol/L (ref 22–32)
CREATININE: 0.87 mg/dL (ref 0.44–1.00)
Chloride: 112 mmol/L — ABNORMAL HIGH (ref 101–111)
GFR calc Af Amer: >60 mL/min (ref >60)
GFR calc non Af Amer: >60 mL/min (ref >60)
GLUCOSE: 167 mg/dL — AB (ref 65–99)
Potassium: 3.9 mmol/L (ref 3.5–5.1)
Sodium: 141 mmol/L (ref 135–145)

## 2015-02-24 LAB — LEGIONELLA ANTIGEN, URINE

## 2015-02-24 LAB — GLUCOSE, CAPILLARY
Glucose-Capillary: 154 mg/dL — ABNORMAL HIGH (ref 65–99)
Glucose-Capillary: 163 mg/dL — ABNORMAL HIGH (ref 65–99)
Glucose-Capillary: 139 mg/dL — ABNORMAL HIGH (ref 65–99)
Glucose-Capillary: 159 mg/dL — ABNORMAL HIGH (ref 65–99)

## 2015-02-24 LAB — CBC
HEMATOCRIT: 40.2 % (ref 36.0–46.0)
HEMOGLOBIN: 12.8 g/dL (ref 12.0–15.0)
MCH: 27.3 pg (ref 26.0–34.0)
MCHC: 31.8 g/dL (ref 30.0–36.0)
MCV: 85.7 fL (ref 78.0–100.0)
Platelets: 318 10*3/uL (ref 150–400)
RBC: 4.69 MIL/uL (ref 3.87–5.11)
RDW: 16.1 % — AB (ref 11.5–15.5)
WBC: 17.3 10*3/uL — ABNORMAL HIGH (ref 4.0–10.5)

## 2015-02-24 LAB — MAGNESIUM: Magnesium: 2.5 mg/dL — ABNORMAL HIGH (ref 1.7–2.4)

## 2015-02-24 MED ORDER — ONDANSETRON 4 MG PO TBDP
4.0000 mg | ORAL_TABLET | Freq: Three times a day (TID) | ORAL | Status: DC | PRN
  Administered 2015-02-24: 4 mg via ORAL
  Filled 2015-02-24 (×2): qty 1

## 2015-02-24 MED ORDER — PANTOPRAZOLE SODIUM 40 MG PO TBEC
40.0000 mg | DELAYED_RELEASE_TABLET | Freq: Every day | ORAL | Status: DC
  Administered 2015-02-24: 40 mg via ORAL
  Filled 2015-02-24: qty 1

## 2015-02-24 MED ORDER — PREDNISONE 20 MG PO TABS
20.0000 mg | ORAL_TABLET | Freq: Every day | ORAL | Status: DC
  Administered 2015-02-25: 20 mg via ORAL
  Filled 2015-02-24: qty 1

## 2015-02-24 MED ORDER — AZITHROMYCIN 500 MG PO TABS
250.0000 mg | ORAL_TABLET | Freq: Once | ORAL | Status: AC
Start: 2015-02-24 — End: 2015-02-24
  Administered 2015-02-24: 250 mg via ORAL
  Filled 2015-02-24: qty 1

## 2015-02-24 MED ORDER — LEVOFLOXACIN 750 MG PO TABS
750.0000 mg | ORAL_TABLET | Freq: Every day | ORAL | Status: DC
  Administered 2015-02-25: 750 mg via ORAL
  Filled 2015-02-24: qty 1

## 2015-02-24 NOTE — Progress Notes (Signed)
UR COMPLETED  

## 2015-02-24 NOTE — Progress Notes (Signed)
Family Medicine Teaching Service Daily Progress Note Intern Pager: [phone]  Patient name: [name] Medical record number: [id number] Date of birth: 1965-08-19 Age: 50 y.o. Gender: female  Primary Care Provider: Devota Pace, MD Consultants: none Code Status: full  Pt Overview and Major Events to Date:  01/27- admitted with chest pain  Assessment and Plan: [name] is a 50 y.o. female presenting with chest pain and nausea. PMH is significant for myasthenia, sarcoid, HTN, DM, pulmonary fibrosis.   Chest pain likely from CAP: improving. CTA w/o PE but multifocal pneumonia. CXR with vascular congestion and borderline cardiomegaly, with mild bilateral atelectasis. EKG negative. Trop 0.03>0.03>0.05. Flu and strep pneumo negative. WBC 18 on 1/28 (on steroid).  Dyspnea and hypoxia to mid 80s on presentation. Satting well on RA now. No documented fevers.  - s/p ctx and azithro in ED. - Discontinue IV CTX and Azithromycin (1/26 -1/19 ) - Transitioning by mouth Levaquin 750 mg  (1/30 - Azithromycin 250 mg today - Supplemental O2 prn - Will need outpatient chest xray in 3-4 weeks to confirm resolution of lung findings - f/u legionella antigens - Continue home albuterol prn, symbicort twice a day and singulair - PT/OT  N/V/D: improved. Urine ketone >80 on arrival. No glucosuria. CTabdomen/pelvis with hepatic steatosis otherwise normal. UDS positive for opiates (has rx). Normal colonoscopy and EGD in 2015. History of IBS. Mildly tender to palpation over epigastric areas.Continues to hypotensive but asymptomatic. Mg 2.5. S/p NS 1L bolus, D5 NS + K @ 125 cc/hr for 24 hrs. Tolerating by mouth well. - KVO - Zofran ODT 4 mg q8hprn  - Phenergan 25 mg q4h as needed - On dicyclomine 20 mg q6h as needed - discontinued GI emptying study. Patient tolerating PO  DM: A1c 7.8, on metformin at home. Holding metformin after CTA. - resistant SSI given morbidly obese and on chronic  steroids  AKI: resolved. Cr 0.87 (1.35 yesterday). Had CT with contrast three days ago. S/p NS 1L bolus, D5 NS + K @ 125 cc/hr for 24 hrs -KVO -Trend BMP   Hypokalemia and Hypomagnesia: resolved. K 3.9  this AM. Mg 2.5  -stopped Mg and K supplements -am BMP   Sarcoid/pulmonary fibrosis/asthma?: chronic prednisone 10mg  daily, unclear exact nature of lung disease. Ca 8.3  - Prednisone to 20 mg daily on 01/27>1/28. - Decadron  4 mg on 1/29 - Resumed prednisone for adrenal insufficiency.  - continue home symbicort, singulair  Myasthenia Gravis: on prednisone 10 mg at home. - steroid as above  HTN: hypotensive this morning but asymptomatic. Likely adrenal insufficiency. On Lisinopril/HCTZ, Furosamide, clonidine patches at home  - Holding her BP meds given low BP and AKI - repeat BP  FEN/GI:  -Bolus NS 1L, then D5 NS + K @ 125 cc/hr -carb mod diet -Protonix  Prophylaxis: lovenox  Disposition: home pending improvement of PO intake  Subjective:  Reports that her nausea and vomiting has improved. Tolerating her meals okay. Denies dizziness, shortness of breath or chest pain with ambulation. Has three loose stools yesterday.   Objective: Temp:  [97.8 F (36.6 C)-98.6 F (37 C)] 98.6 F (37 C) (01/30 0519) Pulse Rate:  [81-94] 81 (01/30 0519) Resp:  [13-20] 13 (01/30 0519) BP: (78-109)/(40-61) 85/40 mmHg (01/30 0519) SpO2:  [97 %-100 %] 100 % (01/30 0519) Physical Exam: Gen: morbidly obese, sitting on the edge of the bed without acute distress CV: regular rate and rythm. S1 & S2 audible, no murmurs. Resp: no apparent work of  breathing, clear to auscultation bilaterally, good air movement. GI: bowel sounds normal, no tenderness to palpation GU: no CVA MSK: no LE edema  Laboratory:  Recent Labs Lab 02/20/15 0549 02/21/15 0207 02/22/15 1652  WBC 14.8* 18.8* 17.7*  HGB 13.5 14.9 15.2*  HCT 40.9 44.6 45.5  PLT 322 313 298    Recent Labs Lab 02/22/15 0541  02/22/15 1652 02/22/15 2147 02/23/15 0600  NA 142 137  --  143  K 2.6* 3.1* 2.9* 3.1*  CL 105 100*  --  106  CO2 23 21*  --  26  BUN 8 9  --  10  CREATININE 0.75 0.83  --  1.35*  CALCIUM 8.3* 9.3  --  8.4*  PROT  --   --   --  6.1*  BILITOT  --   --   --  0.7  ALKPHOS  --   --   --  63  ALT  --   --   --  15  AST  --   --   --  19  GLUCOSE 171* 180*  --  183*    Imaging/Diagnostic Tests: Ct Angio Chest Pe W/cm &/or Wo Cm  02/20/2015  Technically adequate exam showing no acute pulmonary embolus. 2. Small, nonspecific mediastinal lymph nodes. 3. Patchy areas of airspace filling throughout the lungs bilaterally. Findings favor multifocal infectious process. Followup PA and lateral chest X-ray is recommended in 3-4 weeks following trial of antibiotic therapy to ensure resolution and exclude underlying malignancy. 4. Hepatic steatosis. 5. Normal appendix. 6. Status post cholecystectomy. Electronically Signed   By: Norva Pavlov M.D.   On: 02/20/2015 10:45   Ct Abdomen Pelvis W Contrast  02/20/2015  Technically adequate exam showing no acute pulmonary embolus. 2. Small, nonspecific mediastinal lymph nodes. 3. Patchy areas of airspace filling throughout the lungs bilaterally. Findings favor multifocal infectious process. Followup PA and lateral chest X-ray is recommended in 3-4 weeks following trial of antibiotic therapy to ensure resolution and exclude underlying malignancy. 4. Hepatic steatosis. 5. Normal appendix. 6. Status post cholecystectomy. Electronically Signed   By: Norva Pavlov M.D.   On: 02/20/2015 10:45    Sara Hercules, MD 02/24/2015, 7:24 AM PGY-1, [facility] FPTS Intern pager: [phone], text pages welcome

## 2015-02-25 DIAGNOSIS — R079 Chest pain, unspecified: Secondary | ICD-10-CM | POA: Insufficient documentation

## 2015-02-25 LAB — MAGNESIUM: Magnesium: 2.2 mg/dL (ref 1.7–2.4)

## 2015-02-25 LAB — GLUCOSE, CAPILLARY
Glucose-Capillary: 121 mg/dL — ABNORMAL HIGH (ref 65–99)
Glucose-Capillary: 176 mg/dL — ABNORMAL HIGH (ref 65–99)

## 2015-02-25 MED ORDER — ONDANSETRON 4 MG PO TBDP: 4.0000 mg | ORAL_TABLET | Freq: Three times a day (TID) | ORAL | Status: DC | PRN

## 2015-02-25 MED ORDER — LISINOPRIL 20 MG PO TABS
20.0000 mg | ORAL_TABLET | Freq: Every day | ORAL | Status: DC
  Administered 2015-02-25: 20 mg via ORAL
  Filled 2015-02-25: qty 1

## 2015-02-25 MED ORDER — PREDNISONE 10 MG PO TABS: ORAL_TABLET | ORAL | Status: DC

## 2015-02-25 MED ORDER — HYDROCHLOROTHIAZIDE 12.5 MG PO CAPS
12.5000 mg | ORAL_CAPSULE | Freq: Every day | ORAL | Status: DC
  Administered 2015-02-25: 12.5 mg via ORAL
  Filled 2015-02-25: qty 1

## 2015-02-25 NOTE — Discharge Summary (Signed)
Family Medicine Teaching [facility] Discharge Summary  Patient name: [name] Medical record number: [id number] Date of birth: 09/28/1965 Age: 50 y.o. Gender: female Date of Admission: 02/20/2015  Date of Discharge:02/25/2015 Admitting Physician: Leighton Roach McDiarmid, MD  Primary Care Provider: Devota Pace, MD Consultant: none  Indication for Hospitalization: chest pain, nausea, vomiting and diarrhea  Discharge Diagnoses/Problem List:  Patient Active Problem List   Diagnosis Date Noted  . CAP (community acquired pneumonia) 02/20/2015  . Rib pain on left side 06/02/2014  . Encounter for chronic pain management 02/11/2014  . Diabetes (HCC) 02/11/2014  . Sarcoid (HCC) 12/23/2010  . Knee pain, bilateral 05/28/2010  . 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  . 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 Unspecified asthma(493.90) 12/01/2007  . 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 OSA (obstructive sleep apnea) 10/30/2007  . Morbid obesity (HCC) 04/10/2007  . MYASTHENIA 03/07/2007  . SYMPTOM, INCONTINENCE, MIXED, URGE/STRESS 05/30/2006  . DEPRESSIVE DISORDER, NOS 03/24/2006  . HYPERTENSION, BENIGN SYSTEMIC 03/24/2006  . GASTROESOPHAGEAL REFLUX, NO ESOPHAGITIS 03/24/2006    Disposition: home  Discharge Condition: stable  Discharge Exam:  Temp: [98.4 F (36.9 C)-98.8 F (37.1 C)] 98.8 F (37.1 C) (01/31 0557) Pulse Rate: [76-98] 76 (01/31 0557) Resp: [17-18] 18 (01/31 0557) BP: (131-144)/(70-93) 144/93 mmHg (01/31 0557) SpO2: [98 %-100 %] 98 % (01/31 0912) Physical Exam: Gen: morbidly obese, sitting on the edge of the bed without acute distress CV: regular rate and rythm. S1 & S2 audible, no murmurs. Resp: no apparent work of breathing, clear to auscultation bilaterally, good air movement. GI: bowel sounds normal, no tenderness to palpation GU: no CVA MSK: no LE edema  Brief Hospital Course:  [name] is a 50 y.o.  female presenting with chest pain and nausea. PMH is significant for myasthenia, sarcoid, HTN, DM, pulmonary fibrosis.   Chest pain likely from CAP: resolved. CTA w/o PE but multifocal pneumonia with patchy areas of airspace filling throughout the lungs bilaterally. Repeat CXR in 3-4 weeks was recommended for resulution. CXR with vascular congestion and borderline cardiomegaly, with mild bilateral atelectasis. EKG negative. Trop 0.03>0.03>0.05. Influenza A and B negative. Urine legionella and strep pneumo negative. WBC 18 on 1/28 (on steroid). Dyspnea and hypoxia to mid 80s on presentation. No fever while in house. In ED, she was started on ctx and azithromycin, which were continued along with her home albuterol, symbicort and Singulair on admission due to nausea and vomiting. Her symptoms, including nausea and vomiting improved. Her oxygen was weaned, and she was transitioned to Levaquin to complete the course  On the day of discharge, patient without chest pain or shortness of breath. She was ambulating in the room without symptoms. Also evaluated by physical therapy.  N/V/D: improved. History of IBS and cholecystectomy. Normal colonoscopy and EGD in 2015Tender to palpation over epigastric areas. Urine ketone >80 on arrival. No glucosuria. CTabdomen/pelvis with hepatic steatosis otherwise normal. UDS positive for opiates (has rx).  She was given IVF, Zofran and promethazine while in house with subsequent improvement in her symptoms. She tolerated her meals well. She was discharged home on Zofran as needed for nausea.  AKI: resolved. Cr 0.8 on admission (at baseline). It spiked to 1.35 on hospital day 3 likely from CT contrast. She received bolus and maintenance IVF with subsequent resolution in AKI.  Hypokalemia and Hypomagnesia: resolved. K 3.9 this AM. Mg 2.2   Myasthenia Gravis: on prednisone 10 mg at home. Prednisone increased to 20 mg daily  primarily for adrenal insufficiency. She was  discharged on 20 mg for two days, then 10 mg indefinitely.   HTN: BP 144/93 this morning. Was hypotensive for two days prior to discharge likely secondary to adrenal insufficiency. Home blood pressure medications (Lisinopril/HCTZ,  clonidine patches at home and Lasix) held while hypotensive. Lisinopril/HCTZ resumed on discharge with a plan to reinstate the other medications at follow up.  Issues for Follow Up:  1. Obstructive airway disease/MSK: patient on ICS/LABA and SABA. No characterization of her airway disease. Recommend PFT/Spirometery. 2. CT chest finding: repeat CXR in 3-4 weeks was recommended for resulution 3. N/V/D: assess for resolution 4. AKI: resolved on discharge. Recommend CMP and Mg at follow up 5. Hypertension: held her Lasix and Clonidine Patches at discharge. Assess BP and adjust meds as needed. 6. Morbid obesity and comorbidities: consulted CM for personal care service. Follow up on this at follow up.  Significant Procedures: none  Significant Labs and Imaging:   Recent Labs Lab 02/21/15 0207 02/22/15 1652 02/24/15 1555  WBC 18.8* 17.7* 17.3*  HGB 14.9 15.2* 12.8  HCT 44.6 45.5 40.2  PLT 313 298 318    Recent Labs Lab 02/21/15 0207 02/22/15 0541 02/22/15 1652  02/23/15 0600 02/24/15 0733 02/25/15 0813  NA 141 142 137  --  143 141  --   K 2.8* 2.6* 3.1*  < > 3.1* 3.9  --   CL 102 105 100*  --  106 112*  --   CO2 26 23 21*  --  26 23  --   GLUCOSE 197* 171* 180*  --  183* 167*  --   BUN 6 8 9   --  10 16  --   CREATININE 0.70 0.75 0.83  --  1.35* 0.87  --   CALCIUM 9.1 8.3* 9.3  --  8.4* 8.3*  --   MG  --  1.6*  --   --  2.4 2.5* 2.2  ALKPHOS  --   --   --   --  63  --   --   AST  --   --   --   --  19  --   --   ALT  --   --   --   --  15  --   --   ALBUMIN  --   --   --   --  2.6*  --   --   < > = values in this interval not displayed.  Dg Chest 2 View  02/20/2015  CLINICAL DATA:  Acute onset of generalized chest and abdominal pain. Shortness of  breath and vomiting. Initial encounter. EXAM: CHEST  2 VIEW COMPARISON:  Chest radiograph performed 05/31/2014 FINDINGS: The lungs are well-aerated. Vascular congestion is noted, with mild bilateral atelectasis. There is no evidence of pleural effusion or pneumothorax. The heart is borderline enlarged. The patient is status post median sternotomy. No acute osseous abnormalities are seen. IMPRESSION: Vascular congestion and borderline cardiomegaly, with mild bilateral atelectasis. Electronically Signed   By: 07/31/2014 M.D.   On: 02/20/2015 06:21   Ct Angio Chest Pe W/cm &/or Wo Cm  02/20/2015  CLINICAL DATA:  Pt c/o upper midline chest pain, epigastric pain, n/v x 4 days; 02/22/2015 Omni 350 given^132mL OMNIPAQUE IOHEXOL 350 MG/ML SOLN EXAM: CT ANGIOGRAPHY CHEST CT ABDOMEN AND PELVIS WITH CONTRAST TECHNIQUE: Multidetector CT imaging of the chest was performed using the standard protocol during bolus administration of intravenous contrast. Multiplanar CT image reconstructions  and MIPs were obtained to evaluate the vascular anatomy. Multidetector CT imaging of the abdomen and pelvis was performed using the standard protocol during bolus administration of intravenous contrast. CONTRAST:  OMNIPAQUE IOHEXOL 350 MG/ML SOLN COMPARISON:  Chest x-ray 02/20/2015 FINDINGS: CT CHEST FINDINGS Heart: Mild coronary artery calcifications are present. Heart size is normal. No pericardial effusion. Vascular structures: Pulmonary arteries are well opacified. There is no acute pulmonary embolus. Mediastinum/thyroid: The visualized portion of the thyroid gland has a normal appearance. There small bilateral hilar lymph nodes, measuring on the order of 1 cm in diameter. Pretracheal lymph node is 1.3 cm in diameter. Lungs/Airways: There are multiple areas of airspace filling throughout the lungs bilaterally. Some these areas are somewhat discrete and warrant follow-up to exclude sub solid masses. However multifocal infectious  process is favored. Chest wall/osseous: Status post median sternotomy. No suspicious lytic or blastic lesions are identified. CT ABDOMEN AND PELVIS FINDINGS Upper abdomen: There is diffuse low-attenuation of the liver consistent with hepatic steatosis. No focal liver lesions are identified. Gallbladder is surgically absent. No focal abnormality identified within the spleen or pancreas, adrenal glands. Bilateral renal cysts are present. No hydronephrosis. Gastrointestinal tract: The stomach and small bowel loops are normal in appearance. The appendix is well seen and has a normal appearance. Colonic loops are normal in appearance. Pelvis: The uterus is present. No adnexal mass. No free pelvic fluid. Retroperitoneum: No retroperitoneal or mesenteric adenopathy. No evidence for aortic aneurysm. Abdominal wall: Unremarkable. Osseous structures: Unremarkable. Review of the MIP images confirms the above findings. IMPRESSION: 1. Technically adequate exam showing no acute pulmonary embolus. 2. Small, nonspecific mediastinal lymph nodes. 3. Patchy areas of airspace filling throughout the lungs bilaterally. Findings favor multifocal infectious process. Followup PA and lateral chest X-ray is recommended in 3-4 weeks following trial of antibiotic therapy to ensure resolution and exclude underlying malignancy. 4. Hepatic steatosis. 5. Normal appendix. 6. Status post cholecystectomy. Electronically Signed   By: Norva Pavlov M.D.   On: 02/20/2015 10:45   Ct Abdomen Pelvis W Contrast  02/20/2015  CLINICAL DATA:  Pt c/o upper midline chest pain, epigastric pain, n/v x 4 days; Omni 350 given^147mL OMNIPAQUE IOHEXOL 350 MG/ML SOLN EXAM: CT ANGIOGRAPHY CHEST CT ABDOMEN AND PELVIS WITH CONTRAST TECHNIQUE: Multidetector CT imaging of the chest was performed using the standard protocol during bolus administration of intravenous contrast. Multiplanar CT image reconstructions and MIPs were obtained to evaluate the vascular  anatomy. Multidetector CT imaging of the abdomen and pelvis was performed using the standard protocol during bolus administration of intravenous contrast. CONTRAST:  OMNIPAQUE IOHEXOL 350 MG/ML SOLN COMPARISON:  Chest x-ray 02/20/2015 FINDINGS: CT CHEST FINDINGS Heart: Mild coronary artery calcifications are present. Heart size is normal. No pericardial effusion. Vascular structures: Pulmonary arteries are well opacified. There is no acute pulmonary embolus. Mediastinum/thyroid: The visualized portion of the thyroid gland has a normal appearance. There small bilateral hilar lymph nodes, measuring on the order of 1 cm in diameter. Pretracheal lymph node is 1.3 cm in diameter. Lungs/Airways: There are multiple areas of airspace filling throughout the lungs bilaterally. Some these areas are somewhat discrete and warrant follow-up to exclude sub solid masses. However multifocal infectious process is favored. Chest wall/osseous: Status post median sternotomy. No suspicious lytic or blastic lesions are identified. CT ABDOMEN AND PELVIS FINDINGS Upper abdomen: There is diffuse low-attenuation of the liver consistent with hepatic steatosis. No focal liver lesions are identified. Gallbladder is surgically absent. No focal abnormality identified within  the spleen or pancreas, adrenal glands. Bilateral renal cysts are present. No hydronephrosis. Gastrointestinal tract: The stomach and small bowel loops are normal in appearance. The appendix is well seen and has a normal appearance. Colonic loops are normal in appearance. Pelvis: The uterus is present. No adnexal mass. No free pelvic fluid. Retroperitoneum: No retroperitoneal or mesenteric adenopathy. No evidence for aortic aneurysm. Abdominal wall: Unremarkable. Osseous structures: Unremarkable. Review of the MIP images confirms the above findings. IMPRESSION: 1. Technically adequate exam showing no acute pulmonary embolus. 2. Small, nonspecific mediastinal lymph nodes.  3. Patchy areas of airspace filling throughout the lungs bilaterally. Findings favor multifocal infectious process. Followup PA and lateral chest X-ray is recommended in 3-4 weeks following trial of antibiotic therapy to ensure resolution and exclude underlying malignancy. 4. Hepatic steatosis. 5. Normal appendix. 6. Status post cholecystectomy. Electronically Signed   By: Norva Pavlov M.D.   On: 02/20/2015 10:45   Results/Tests Pending at Time of Discharge: none  Discharge Medications:    Medication List    STOP taking these medications        cloNIDine 0.1 MG tablet  Commonly known as:  CATAPRES     cloNIDine 0.3 mg/24hr patch  Commonly known as:  CATAPRES - Dosed in mg/24 hr     dexamethasone 4 MG tablet  Commonly known as:  DECADRON     furosemide 20 MG tablet  Commonly known as:  LASIX      TAKE these medications        albuterol 108 (90 Base) MCG/ACT inhaler  Commonly known as:  PROAIR HFA  Inhale 2 puffs into the lungs every 4 (four) hours as needed.     atorvastatin 40 MG tablet  Commonly known as:  LIPITOR  Take 1 tablet (40 mg total) by mouth daily.     BENEFIBER Powd  2 tablespoons each day     budesonide-formoterol 160-4.5 MCG/ACT inhaler  Commonly known as:  SYMBICORT  Inhale 2 puffs into the lungs 2 (two) times daily as needed (for shortness of breath).     cetirizine 10 MG tablet  Commonly known as:  ZYRTEC  Take 1 tablet (10 mg total) by mouth daily.     dicyclomine 20 MG tablet  Commonly known as:  BENTYL  Take 1 tablet (20 mg total) by mouth every 6 (six) hours as needed for spasms (abdominal pain).     etodolac 400 MG tablet  Commonly known as:  LODINE  Take 400 mg by mouth 2 (two) times daily.     FLUoxetine 40 MG capsule  Commonly known as:  PROZAC  Take 1 capsule (40 mg total) by mouth daily.     lisinopril-hydrochlorothiazide 20-12.5 MG tablet  Commonly known as:  PRINZIDE,ZESTORETIC  Take 1 tablet by mouth daily.      medroxyPROGESTERone 150 MG/ML injection  Commonly known as:  DEPO-PROVERA  Inject 150 mg into the muscle every 3 (three) months.     metFORMIN 850 MG tablet  Commonly known as:  GLUCOPHAGE  Take 1 tablet (850 mg total) by mouth 2 (two) times daily with a meal.     montelukast 10 MG tablet  Commonly known as:  SINGULAIR  Take 1 tablet (10 mg total) by mouth at bedtime.     omeprazole 40 MG capsule  Commonly known as:  PRILOSEC  TAKE 1 CAPSULE (40 MG TOTAL) BY MOUTH 2 (TWO) TIMES DAILY.     ondansetron 4 MG disintegrating tablet  Commonly known as:  ZOFRAN-ODT  Take 1 tablet (4 mg total) by mouth every 8 (eight) hours as needed for nausea or vomiting.     oxybutynin 5 MG tablet  Commonly known as:  DITROPAN  Take 1 tablet (5 mg total) by mouth 2 (two) times daily.     oxyCODONE 5 MG immediate release tablet  Commonly known as:  Oxy IR/ROXICODONE  Take 1 tablet (5 mg total) by mouth every 8 (eight) hours as needed for severe pain. Do not fill until 30 days after this prescription date.     polyethylene glycol packet  Commonly known as:  MIRALAX / GLYCOLAX  Take 17 g by mouth daily as needed for mild constipation.     predniSONE 10 MG tablet  Commonly known as:  DELTASONE  Take two tablets with breaksfast for two days, then one tablet daily.     promethazine 12.5 MG tablet  Commonly known as:  PHENERGAN  Take 1 tablet (12.5 mg total) by mouth every 6 (six) hours as needed for nausea or vomiting.     Slip-On Knee Brace Misc  Please evaluate/measure and dispense appropriate braces for severe DJD. Call [phone] with any questions.     sulindac 200 MG tablet  Commonly known as:  CLINORIL  Take 1 tablet (200 mg total) by mouth 2 (two) times daily.        Discharge Instructions: Please refer to Patient Instructions section of EMR for full details.  Patient was counseled important signs and symptoms that should prompt return to medical care, changes in medications, dietary  instructions, activity restrictions, and follow up appointments.   Follow-Up Appointments: Follow-up Information    Follow up with [name], MD On 03/04/2015.   Specialty:  Family Medicine   Why:  at 10:30 am.    Contact information:   [address] [place] [address] [phone]       [name], MD 02/25/2015, 9:05 PM PGY-1, [facility]

## 2015-02-25 NOTE — Evaluation (Signed)
Physical Therapy Evaluation Patient Details Name: [name] MRN: [id number] DOB: Jan 18, 1966 Today's Date: 02/25/2015   History of Present Illness  Pt adm for chest pain and found to have PNA. PMH is significant for myasthenia, sarcoid, HTN, DM, pulmonary fibrosis.  Clinical Impression  Pt doing well with mobility and no further PT needed.  Ready for dc from PT standpoint.      Follow Up Recommendations No PT follow up    Equipment Recommendations  None recommended by PT    Recommendations for Other Services       Precautions / Restrictions Precautions Precautions: None Restrictions Weight Bearing Restrictions: No      Mobility  Bed Mobility Overal bed mobility: Modified Independent                Transfers Overall transfer level: Modified independent Equipment used: 4-wheeled walker                Ambulation/Gait Ambulation/Gait assistance: Modified independent (Device/Increase time) Ambulation Distance (Feet): 200 Feet Assistive device: 4-wheeled walker Gait Pattern/deviations: Step-through pattern;Wide base of support Gait velocity: decr Gait velocity interpretation: Below normal speed for age/gender General Gait Details: Steady gait with rollator. Dyspnea 2/4.  Stairs            Wheelchair Mobility    Modified Rankin (Stroke Patients Only)       Balance Overall balance assessment: No apparent balance deficits (not formally assessed)                                           Pertinent Vitals/Pain Pain Assessment: No/denies pain    Home Living Family/patient expects to be discharged to:: Private residence Living Arrangements: Children   Type of Home: Apartment Home Access: Stairs to enter Entrance Stairs-Rails: None Entrance Stairs-Number of Steps: 2 Home Layout: One level Home Equipment: Walker - 4 wheels;Cane - single point      Prior Function Level of Independence: Independent with assistive  device(s)         Comments: Uses cane or rollator     Hand Dominance        Extremity/Trunk Assessment   Upper Extremity Assessment: Overall WFL for tasks assessed           Lower Extremity Assessment: Overall WFL for tasks assessed         Communication   Communication: No difficulties  Cognition Arousal/Alertness: Awake/alert Behavior During Therapy: WFL for tasks assessed/performed Overall Cognitive Status: Within Functional Limits for tasks assessed                      General Comments      Exercises        Assessment/Plan    PT Assessment Patent does not need any further PT services  PT Diagnosis Difficulty walking   PT Problem List    PT Treatment Interventions     PT Goals (Current goals can be found in the Care Plan section) Acute Rehab PT Goals PT Goal Formulation: All assessment and education complete, DC therapy    Frequency     Barriers to discharge        Co-evaluation               End of Session   Activity Tolerance: Patient tolerated treatment well Patient left: in bed;with call bell/phone within reach (sitting EOB) Nurse Communication:  Mobility status         Time: 1110-1135 PT Time Calculation (min) (ACUTE ONLY): 25 min   Charges:   PT Evaluation $PT Eval Low Complexity: 1 Procedure     PT G Codes:        [name] 2015/03/02, 2:22 PM [facility] PT [phone]

## 2015-02-25 NOTE — Progress Notes (Signed)
NURSING PROGRESS NOTE  BANA BORGMEYER [id number] Discharge Data: 02/25/2015 3:57 PM Attending Provider: Leighton Roach McDiarmid, MD EXB:[id number] Melancon, MD     [name] to be D/C'd Home per MD order.  Discussed with the patient the After Visit Summary and all questions fully answered. All IV's discontinued with no bleeding noted. All belongings returned to patient for patient to take home.   Last Vital Signs:  Blood pressure 144/93, pulse 76, temperature 98.8 F (37.1 C), temperature source Oral, resp. rate 18, height 5\' 4"  (1.626 m), weight 158.26 kg (348 lb 14.4 oz), SpO2 98 %.  Discharge Medication List   Medication List    STOP taking these medications        cloNIDine 0.1 MG tablet  Commonly known as:  CATAPRES     cloNIDine 0.3 mg/24hr patch  Commonly known as:  CATAPRES - Dosed in mg/24 hr     dexamethasone 4 MG tablet  Commonly known as:  DECADRON     furosemide 20 MG tablet  Commonly known as:  LASIX      TAKE these medications        albuterol 108 (90 Base) MCG/ACT inhaler  Commonly known as:  PROAIR HFA  Inhale 2 puffs into the lungs every 4 (four) hours as needed.     atorvastatin 40 MG tablet  Commonly known as:  LIPITOR  Take 1 tablet (40 mg total) by mouth daily.     BENEFIBER Powd  2 tablespoons each day     budesonide-formoterol 160-4.5 MCG/ACT inhaler  Commonly known as:  SYMBICORT  Inhale 2 puffs into the lungs 2 (two) times daily as needed (for shortness of breath).     cetirizine 10 MG tablet  Commonly known as:  ZYRTEC  Take 1 tablet (10 mg total) by mouth daily.     dicyclomine 20 MG tablet  Commonly known as:  BENTYL  Take 1 tablet (20 mg total) by mouth every 6 (six) hours as needed for spasms (abdominal pain).     etodolac 400 MG tablet  Commonly known as:  LODINE  Take 400 mg by mouth 2 (two) times daily.     FLUoxetine 40 MG capsule  Commonly known as:  PROZAC  Take 1 capsule (40 mg total) by mouth daily.     lisinopril-hydrochlorothiazide 20-12.5 MG tablet  Commonly known as:  PRINZIDE,ZESTORETIC  Take 1 tablet by mouth daily.     medroxyPROGESTERone 150 MG/ML injection  Commonly known as:  DEPO-PROVERA  Inject 150 mg into the muscle every 3 (three) months.     metFORMIN 850 MG tablet  Commonly known as:  GLUCOPHAGE  Take 1 tablet (850 mg total) by mouth 2 (two) times daily with a meal.     montelukast 10 MG tablet  Commonly known as:  SINGULAIR  Take 1 tablet (10 mg total) by mouth at bedtime.     omeprazole 40 MG capsule  Commonly known as:  PRILOSEC  TAKE 1 CAPSULE (40 MG TOTAL) BY MOUTH 2 (TWO) TIMES DAILY.     ondansetron 4 MG disintegrating tablet  Commonly known as:  ZOFRAN-ODT  Take 1 tablet (4 mg total) by mouth every 8 (eight) hours as needed for nausea or vomiting.     oxybutynin 5 MG tablet  Commonly known as:  DITROPAN  Take 1 tablet (5 mg total) by mouth 2 (two) times daily.     oxyCODONE 5 MG immediate release tablet  Commonly known as:  Oxy  IR/ROXICODONE  Take 1 tablet (5 mg total) by mouth every 8 (eight) hours as needed for severe pain. Do not fill until 30 days after this prescription date.     polyethylene glycol packet  Commonly known as:  MIRALAX / GLYCOLAX  Take 17 g by mouth daily as needed for mild constipation.     predniSONE 10 MG tablet  Commonly known as:  DELTASONE  Take two tablets with breaksfast for two days, then one tablet daily.     promethazine 12.5 MG tablet  Commonly known as:  PHENERGAN  Take 1 tablet (12.5 mg total) by mouth every 6 (six) hours as needed for nausea or vomiting.     Slip-On Knee Brace Misc  Please evaluate/measure and dispense appropriate braces for severe DJD. Call [phone] with any questions.     sulindac 200 MG tablet  Commonly known as:  CLINORIL  Take 1 tablet (200 mg total) by mouth 2 (two) times daily.         Roma Kayser, RN

## 2015-02-25 NOTE — Progress Notes (Signed)
Family Medicine Teaching Service Daily Progress Note Intern Pager: [phone]  Patient name: [name] Medical record number: [id number] Date of birth: 1965-09-13 Age: 50 y.o. Gender: female  Primary Care Provider: Devota Pace, MD Consultants: none Code Status: full  Pt Overview and Major Events to Date:  01/27- admitted with chest pain  Assessment and Plan: [name] is a 50 y.o. female presenting with chest pain and nausea. PMH is significant for myasthenia, sarcoid, HTN, DM, pulmonary fibrosis.   Chest pain likely from CAP: improving. CTA w/o PE but multifocal pneumonia. CXR with vascular congestion and borderline cardiomegaly, with mild bilateral atelectasis. EKG negative. Trop 0.03>0.03>0.05. Flu and strep pneumo negative. WBC 18 on 1/28 (on steroid).  Dyspnea and hypoxia to mid 80s on presentation. Satting well on RA now. No documented fevers.  - s/p ctx and azithro in ED. - Discontinue IV CTX and Azithromycin (1/26 -1/19 ) - Transitioning by mouth Levaquin 750 mg  (1/30-2/1) - Azithromycin 250 mg today - Supplemental O2 prn - Will need outpatient chest xray in 3-4 weeks to confirm resolution of lung findings - f/u legionella antigens - Continue home albuterol prn, symbicort twice a day and singulair  N/V/D: improved. Urine ketone >80 on arrival. No glucosuria. CTabdomen/pelvis with hepatic steatosis otherwise normal. UDS positive for opiates (has rx). Normal colonoscopy and EGD in 2015. History of IBS. Mildly tender to palpation over epigastric areas.Continues to hypotensive but asymptomatic. Mg 2.5. S/p NS 1L bolus, D5 NS + K @ 125 cc/hr for 24 hrs. Tolerating by mouth well. - KVO - Zofran ODT 4 mg q8hprn  - Phenergan 25 mg q4h as needed - On dicyclomine 20 mg q6h as needed - discontinued GI emptying study. Patient tolerating PO  DM: A1c 7.8, on metformin at home. Holding metformin after CTA. - resistant SSI given morbidly obese and on chronic steroids  AKI:  resolved. Cr 0.87 (1.35 yesterday). Had CT with contrast three days ago. S/p NS 1L bolus, D5 NS + K @ 125 cc/hr for 24 hrs -KVO -Trend BMP   Hypokalemia and Hypomagnesia: resolved. K 3.9  this AM. Mg 2.5  -stopped Mg and K supplements -am BMP   Sarcoid/pulmonary fibrosis/asthma?: chronic prednisone 10mg  daily, unclear exact nature of lung disease. Ca 8.3  - Prednisone to 20 mg daily on 01/27>1/28. - Decadron  4 mg on 1/29 - Resumed prednisone at 20 mg for adrenal insufficiency.  - continue home symbicort, singulair  Myasthenia Gravis: on prednisone 10 mg at home. - steroid as above  HTN: BP 144/93 this morning. Was hypotensive for the last two days likely due to adrenal insufficiency. On Lisinopril/HCTZ, Furosamide, clonidine patches at home. Held her meds while hypotensive. -Resume lisinopril and HCTZ -Discharge on home meds  FEN/GI:  -KVO. s/p Bolus NS 1L, then D5 NS + K @ 125 cc/hr for 24 hrs -carb mod diet -Protonix  Prophylaxis: lovenox  Disposition: home today  Subjective:  Reports some nausea. No emesis or diarrhea. She ready to go home.  Objective: Temp:  [98.4 F (36.9 C)-98.8 F (37.1 C)] 98.8 F (37.1 C) (01/31 0557) Pulse Rate:  [76-98] 76 (01/31 0557) Resp:  [17-18] 18 (01/31 0557) BP: (131-144)/(70-93) 144/93 mmHg (01/31 0557) SpO2:  [98 %-100 %] 98 % (01/31 0912) Physical Exam: Gen: morbidly obese, sitting on the edge of the bed without acute distress CV: regular rate and rythm. S1 & S2 audible, no murmurs. Resp: no apparent work of breathing, clear to auscultation bilaterally, good air  movement. GI: bowel sounds normal, no tenderness to palpation GU: no CVA MSK: no LE edema  Laboratory:  Recent Labs Lab 02/21/15 0207 02/22/15 1652 02/24/15 1555  WBC 18.8* 17.7* 17.3*  HGB 14.9 15.2* 12.8  HCT 44.6 45.5 40.2  PLT 313 298 318    Recent Labs Lab 02/22/15 1652 02/22/15 2147 02/23/15 0600 02/24/15 0733  NA 137  --  143 141  K 3.1* 2.9*  3.1* 3.9  CL 100*  --  106 112*  CO2 21*  --  26 23  BUN 9  --  10 16  CREATININE 0.83  --  1.35* 0.87  CALCIUM 9.3  --  8.4* 8.3*  PROT  --   --  6.1*  --   BILITOT  --   --  0.7  --   ALKPHOS  --   --  63  --   ALT  --   --  15  --   AST  --   --  19  --   GLUCOSE 180*  --  183* 167*    Imaging/Diagnostic Tests: Ct Angio Chest Pe W/cm &/or Wo Cm  02/20/2015  Technically adequate exam showing no acute pulmonary embolus. 2. Small, nonspecific mediastinal lymph nodes. 3. Patchy areas of airspace filling throughout the lungs bilaterally. Findings favor multifocal infectious process. Followup PA and lateral chest X-ray is recommended in 3-4 weeks following trial of antibiotic therapy to ensure resolution and exclude underlying malignancy. 4. Hepatic steatosis. 5. Normal appendix. 6. Status post cholecystectomy. Electronically Signed   By: Norva Pavlov M.D.   On: 02/20/2015 10:45   Ct Abdomen Pelvis W Contrast  02/20/2015  Technically adequate exam showing no acute pulmonary embolus. 2. Small, nonspecific mediastinal lymph nodes. 3. Patchy areas of airspace filling throughout the lungs bilaterally. Findings favor multifocal infectious process. Followup PA and lateral chest X-ray is recommended in 3-4 weeks following trial of antibiotic therapy to ensure resolution and exclude underlying malignancy. 4. Hepatic steatosis. 5. Normal appendix. 6. Status post cholecystectomy. Electronically Signed   By: Norva Pavlov M.D.   On: 02/20/2015 10:45    [name], MD 02/25/2015, 9:52 AM PGY-1, [place] [facility] FPTS Intern pager: [phone], text pages welcome

## 2015-02-25 NOTE — Discharge Instructions (Signed)
It has been a pleasure taking care of you! You were admitted due to chest pain, nausea and vomiting. We did a chest x-ray that showed pneumonia. We have treated you with full course of antibiotics.  We have also given you medication for nausea and vomiting.  We are discharging you on more of this medication that you can continue taking after you leave the hospital for nausea and vomiting. We have made some adjustments to your other medications. Please, make sure to read the directions before you take them. The names and directions on how to take these medications are found on this discharge paper under medication section.  You also need a follow up with your primary care doctor. The address, date and time are found on the discharge paper under follow up section.  Take care,

## 2015-02-28 ENCOUNTER — Emergency Department (HOSPITAL_COMMUNITY): Payer: Medicaid Other

## 2015-02-28 ENCOUNTER — Encounter (HOSPITAL_COMMUNITY): Payer: Self-pay | Admitting: Emergency Medicine

## 2015-02-28 ENCOUNTER — Observation Stay (HOSPITAL_COMMUNITY)
Admission: EM | Admit: 2015-02-28 | Discharge: 2015-03-02 | Disposition: A | Discharge status: Home / Self Care | Payer: Medicaid Other | Attending: Family Medicine | Admitting: Family Medicine

## 2015-02-28 DIAGNOSIS — K5900 Constipation, unspecified: Secondary | ICD-10-CM | POA: Insufficient documentation

## 2015-02-28 DIAGNOSIS — D869 Sarcoidosis, unspecified: Secondary | ICD-10-CM

## 2015-02-28 DIAGNOSIS — J45909 Unspecified asthma, uncomplicated: Secondary | ICD-10-CM | POA: Diagnosis not present

## 2015-02-28 DIAGNOSIS — D72829 Elevated white blood cell count, unspecified: Secondary | ICD-10-CM | POA: Insufficient documentation

## 2015-02-28 DIAGNOSIS — R1110 Vomiting, unspecified: Secondary | ICD-10-CM | POA: Diagnosis present

## 2015-02-28 DIAGNOSIS — R109 Unspecified abdominal pain: Secondary | ICD-10-CM | POA: Insufficient documentation

## 2015-02-28 DIAGNOSIS — K8020 Calculus of gallbladder without cholecystitis without obstruction: Secondary | ICD-10-CM | POA: Insufficient documentation

## 2015-02-28 DIAGNOSIS — Z7984 Long term (current) use of oral hypoglycemic drugs: Secondary | ICD-10-CM | POA: Insufficient documentation

## 2015-02-28 DIAGNOSIS — G4700 Insomnia, unspecified: Secondary | ICD-10-CM | POA: Diagnosis not present

## 2015-02-28 DIAGNOSIS — I10 Essential (primary) hypertension: Secondary | ICD-10-CM | POA: Insufficient documentation

## 2015-02-28 DIAGNOSIS — F419 Anxiety disorder, unspecified: Secondary | ICD-10-CM | POA: Insufficient documentation

## 2015-02-28 DIAGNOSIS — Z872 Personal history of diseases of the skin and subcutaneous tissue: Secondary | ICD-10-CM | POA: Insufficient documentation

## 2015-02-28 DIAGNOSIS — Z79899 Other long term (current) drug therapy: Secondary | ICD-10-CM | POA: Diagnosis not present

## 2015-02-28 DIAGNOSIS — G5600 Carpal tunnel syndrome, unspecified upper limb: Secondary | ICD-10-CM | POA: Diagnosis not present

## 2015-02-28 DIAGNOSIS — Z8701 Personal history of pneumonia (recurrent): Secondary | ICD-10-CM | POA: Insufficient documentation

## 2015-02-28 DIAGNOSIS — R112 Nausea with vomiting, unspecified: Secondary | ICD-10-CM | POA: Diagnosis present

## 2015-02-28 DIAGNOSIS — F329 Major depressive disorder, single episode, unspecified: Secondary | ICD-10-CM | POA: Diagnosis not present

## 2015-02-28 DIAGNOSIS — E119 Type 2 diabetes mellitus without complications: Secondary | ICD-10-CM | POA: Insufficient documentation

## 2015-02-28 DIAGNOSIS — R079 Chest pain, unspecified: Secondary | ICD-10-CM | POA: Insufficient documentation

## 2015-02-28 DIAGNOSIS — G8929 Other chronic pain: Secondary | ICD-10-CM | POA: Insufficient documentation

## 2015-02-28 DIAGNOSIS — M1990 Unspecified osteoarthritis, unspecified site: Secondary | ICD-10-CM | POA: Diagnosis not present

## 2015-02-28 DIAGNOSIS — K219 Gastro-esophageal reflux disease without esophagitis: Secondary | ICD-10-CM | POA: Diagnosis not present

## 2015-02-28 DIAGNOSIS — G4733 Obstructive sleep apnea (adult) (pediatric): Secondary | ICD-10-CM | POA: Diagnosis not present

## 2015-02-28 DIAGNOSIS — M6530 Trigger finger, unspecified finger: Secondary | ICD-10-CM | POA: Insufficient documentation

## 2015-02-28 DIAGNOSIS — K279 Peptic ulcer, site unspecified, unspecified as acute or chronic, without hemorrhage or perforation: Secondary | ICD-10-CM | POA: Diagnosis not present

## 2015-02-28 LAB — BASIC METABOLIC PANEL
Anion gap: 13 (ref 5–15)
BUN: 12 mg/dL (ref 6–20)
CO2: 23 mmol/L (ref 22–32)
CREATININE: 0.9 mg/dL (ref 0.44–1.00)
Calcium: 9.4 mg/dL (ref 8.9–10.3)
Chloride: 103 mmol/L (ref 101–111)
GFR calc Af Amer: >60 mL/min (ref >60)
Glucose, Bld: 177 mg/dL — ABNORMAL HIGH (ref 65–99)
POTASSIUM: 3.6 mmol/L (ref 3.5–5.1)
SODIUM: 139 mmol/L (ref 135–145)

## 2015-02-28 LAB — CBC
HCT: 41.8 % (ref 36.0–46.0)
Hemoglobin: 14.3 g/dL (ref 12.0–15.0)
MCH: 28.4 pg (ref 26.0–34.0)
MCHC: 34.2 g/dL (ref 30.0–36.0)
MCV: 82.9 fL (ref 78.0–100.0)
PLATELETS: 333 10*3/uL (ref 150–400)
RBC: 5.04 MIL/uL (ref 3.87–5.11)
RDW: 15.9 % — AB (ref 11.5–15.5)
WBC: 14.5 10*3/uL — AB (ref 4.0–10.5)

## 2015-02-28 LAB — I-STAT TROPONIN, ED: Troponin i, poc: 0 ng/mL (ref 0.00–0.08)

## 2015-02-28 MED ORDER — SODIUM CHLORIDE 0.9 % IV SOLN
1000.0000 mL | Freq: Once | INTRAVENOUS | Status: AC
  Administered 2015-02-28: 1000 mL via INTRAVENOUS

## 2015-02-28 MED ORDER — PROMETHAZINE HCL 25 MG/ML IJ SOLN
25.0000 mg | Freq: Once | INTRAMUSCULAR | Status: AC
  Administered 2015-02-28: 25 mg via INTRAVENOUS
  Filled 2015-02-28: qty 1

## 2015-02-28 MED ORDER — MORPHINE SULFATE (PF) 4 MG/ML IV SOLN
4.0000 mg | Freq: Once | INTRAVENOUS | Status: AC
  Administered 2015-02-28: 4 mg via INTRAVENOUS
  Filled 2015-02-28: qty 1

## 2015-02-28 MED ORDER — PANTOPRAZOLE SODIUM 40 MG IV SOLR
40.0000 mg | Freq: Once | INTRAVENOUS | Status: AC
  Administered 2015-02-28: 40 mg via INTRAVENOUS
  Filled 2015-02-28: qty 40

## 2015-02-28 MED ORDER — ONDANSETRON 4 MG PO TBDP
4.0000 mg | ORAL_TABLET | Freq: Once | ORAL | Status: AC
  Administered 2015-02-28: 4 mg via ORAL

## 2015-02-28 MED ORDER — SODIUM CHLORIDE 0.9 % IV SOLN
1000.0000 mL | INTRAVENOUS | Status: DC
  Administered 2015-03-01: 1000 mL via INTRAVENOUS

## 2015-02-28 MED ORDER — ONDANSETRON 4 MG PO TBDP
ORAL_TABLET | ORAL | Status: AC
  Administered 2015-02-28: 4 mg via ORAL
  Filled 2015-02-28: qty 1

## 2015-02-28 MED ORDER — ONDANSETRON HCL 4 MG PO TABS: 4.0000 mg | ORAL_TABLET | Freq: Once | ORAL | Status: DC

## 2015-02-28 NOTE — ED Notes (Signed)
Pt from home for eval of substernal cp that started 3 hours ago, states was recently discharged from upstairs and told had pneumonia. Pt hyperventilating in triage, states she "cannot talk" but pt asking for nausea medicine "not zofran that doesn't work"

## 2015-02-28 NOTE — ED Notes (Signed)
Dr. Pfeiffer at bedside   

## 2015-03-01 ENCOUNTER — Observation Stay (HOSPITAL_COMMUNITY): Payer: Medicaid Other

## 2015-03-01 ENCOUNTER — Encounter (HOSPITAL_COMMUNITY): Payer: Self-pay | Admitting: *Deleted

## 2015-03-01 DIAGNOSIS — D72829 Elevated white blood cell count, unspecified: Secondary | ICD-10-CM | POA: Insufficient documentation

## 2015-03-01 DIAGNOSIS — R1030 Lower abdominal pain, unspecified: Secondary | ICD-10-CM | POA: Diagnosis not present

## 2015-03-01 DIAGNOSIS — D869 Sarcoidosis, unspecified: Secondary | ICD-10-CM

## 2015-03-01 DIAGNOSIS — R1110 Vomiting, unspecified: Secondary | ICD-10-CM | POA: Diagnosis present

## 2015-03-01 DIAGNOSIS — R112 Nausea with vomiting, unspecified: Secondary | ICD-10-CM | POA: Diagnosis present

## 2015-03-01 DIAGNOSIS — R109 Unspecified abdominal pain: Secondary | ICD-10-CM | POA: Insufficient documentation

## 2015-03-01 LAB — URINALYSIS, ROUTINE W REFLEX MICROSCOPIC
BILIRUBIN URINE: NEGATIVE
Glucose, UA: NEGATIVE mg/dL
HGB URINE DIPSTICK: NEGATIVE
Ketones, ur: NEGATIVE mg/dL
Leukocytes, UA: NEGATIVE
Nitrite: NEGATIVE
PH: 7 (ref 5.0–8.0)
Protein, ur: NEGATIVE mg/dL
SPECIFIC GRAVITY, URINE: 1.026 (ref 1.005–1.030)

## 2015-03-01 LAB — LIPASE, BLOOD: LIPASE: 41 U/L (ref 11–51)

## 2015-03-01 LAB — BASIC METABOLIC PANEL
Anion gap: 11 (ref 5–15)
BUN: 9 mg/dL (ref 6–20)
CHLORIDE: 105 mmol/L (ref 101–111)
CO2: 22 mmol/L (ref 22–32)
CREATININE: 0.72 mg/dL (ref 0.44–1.00)
Calcium: 8.3 mg/dL — ABNORMAL LOW (ref 8.9–10.3)
GFR calc Af Amer: >60 mL/min (ref >60)
GFR calc non Af Amer: >60 mL/min (ref >60)
Glucose, Bld: 213 mg/dL — ABNORMAL HIGH (ref 65–99)
Potassium: 3.5 mmol/L (ref 3.5–5.1)
SODIUM: 138 mmol/L (ref 135–145)

## 2015-03-01 LAB — GLUCOSE, CAPILLARY
GLUCOSE-CAPILLARY: 175 mg/dL — AB (ref 65–99)
GLUCOSE-CAPILLARY: 154 mg/dL — AB (ref 65–99)
Glucose-Capillary: 201 mg/dL — ABNORMAL HIGH (ref 65–99)
Glucose-Capillary: 126 mg/dL — ABNORMAL HIGH (ref 65–99)
Glucose-Capillary: 170 mg/dL — ABNORMAL HIGH (ref 65–99)

## 2015-03-01 LAB — HEPATIC FUNCTION PANEL
ALBUMIN: 3.7 g/dL (ref 3.5–5.0)
ALK PHOS: 81 U/L (ref 38–126)
ALT: 25 U/L (ref 14–54)
AST: 25 U/L (ref 15–41)
BILIRUBIN INDIRECT: 0.8 mg/dL (ref 0.3–0.9)
Bilirubin, Direct: 0.4 mg/dL (ref 0.1–0.5)
TOTAL PROTEIN: 7.4 g/dL (ref 6.5–8.1)
Total Bilirubin: 1.2 mg/dL (ref 0.3–1.2)

## 2015-03-01 MED ORDER — DEXTROSE-NACL 5-0.9 % IV SOLN
INTRAVENOUS | Status: DC
Start: 2015-03-01 — End: 2015-03-02
  Administered 2015-03-01 – 2015-03-02 (×3): via INTRAVENOUS

## 2015-03-01 MED ORDER — POLYETHYLENE GLYCOL 3350 17 G PO PACK: 17.0000 g | PACK | Freq: Every day | ORAL | Status: DC | PRN

## 2015-03-01 MED ORDER — GI COCKTAIL ~~LOC~~: 30.0000 mL | Freq: Two times a day (BID) | ORAL | Status: DC | PRN

## 2015-03-01 MED ORDER — HYDRALAZINE HCL 20 MG/ML IJ SOLN
10.0000 mg | Freq: Once | INTRAMUSCULAR | Status: AC
  Administered 2015-03-01: 10 mg via INTRAVENOUS
  Filled 2015-03-01: qty 1

## 2015-03-01 MED ORDER — BISACODYL 10 MG RE SUPP: 10.0000 mg | Freq: Every day | RECTAL | Status: DC | PRN

## 2015-03-01 MED ORDER — SODIUM CHLORIDE 0.9 % IV BOLUS (SEPSIS)
1000.0000 mL | Freq: Once | INTRAVENOUS | Status: AC
  Administered 2015-03-01: 1000 mL via INTRAVENOUS

## 2015-03-01 MED ORDER — PROMETHAZINE HCL 25 MG RE SUPP: 25.0000 mg | Freq: Four times a day (QID) | RECTAL | Status: DC | PRN

## 2015-03-01 MED ORDER — METOCLOPRAMIDE HCL 10 MG PO TABS
10.0000 mg | ORAL_TABLET | Freq: Three times a day (TID) | ORAL | Status: DC
  Administered 2015-03-01 – 2015-03-02 (×3): 10 mg via ORAL
  Filled 2015-03-01 (×3): qty 1

## 2015-03-01 MED ORDER — INSULIN ASPART 100 UNIT/ML ~~LOC~~ SOLN
0.0000 [IU] | Freq: Three times a day (TID) | SUBCUTANEOUS | Status: DC
  Administered 2015-03-01: 1 [IU] via SUBCUTANEOUS
  Administered 2015-03-01: 2 [IU] via SUBCUTANEOUS
  Administered 2015-03-01: 3 [IU] via SUBCUTANEOUS
  Administered 2015-03-02: 2 [IU] via SUBCUTANEOUS

## 2015-03-01 MED ORDER — ACETAMINOPHEN 325 MG PO TABS
650.0000 mg | ORAL_TABLET | Freq: Four times a day (QID) | ORAL | Status: DC | PRN
  Administered 2015-03-02: 650 mg via ORAL
  Filled 2015-03-01: qty 2

## 2015-03-01 MED ORDER — ONDANSETRON 4 MG PO TBDP: ORAL_TABLET | ORAL | Status: DC

## 2015-03-01 MED ORDER — LISINOPRIL-HYDROCHLOROTHIAZIDE 20-12.5 MG PO TABS: 1.0000 | ORAL_TABLET | Freq: Every day | ORAL | Status: DC

## 2015-03-01 MED ORDER — ALBUTEROL SULFATE (2.5 MG/3ML) 0.083% IN NEBU: 3.0000 mL | INHALATION_SOLUTION | RESPIRATORY_TRACT | Status: DC | PRN

## 2015-03-01 MED ORDER — HYDROCHLOROTHIAZIDE 12.5 MG PO CAPS
12.5000 mg | ORAL_CAPSULE | Freq: Every day | ORAL | Status: DC
  Administered 2015-03-01: 12.5 mg via ORAL
  Filled 2015-03-01: qty 1

## 2015-03-01 MED ORDER — PREDNISONE 10 MG PO TABS
10.0000 mg | ORAL_TABLET | Freq: Every day | ORAL | Status: DC
  Administered 2015-03-01 – 2015-03-02 (×2): 10 mg via ORAL
  Filled 2015-03-01 (×2): qty 1

## 2015-03-01 MED ORDER — LISINOPRIL 20 MG PO TABS
20.0000 mg | ORAL_TABLET | Freq: Every day | ORAL | Status: DC
  Administered 2015-03-01: 20 mg via ORAL
  Filled 2015-03-01: qty 1

## 2015-03-01 MED ORDER — SODIUM CHLORIDE 0.9 % IV SOLN: INTRAVENOUS | Status: DC

## 2015-03-01 MED ORDER — HEPARIN SODIUM (PORCINE) 5000 UNIT/ML IJ SOLN
5000.0000 [IU] | Freq: Three times a day (TID) | INTRAMUSCULAR | Status: DC
  Administered 2015-03-01 – 2015-03-02 (×3): 5000 [IU] via SUBCUTANEOUS
  Filled 2015-03-01 (×3): qty 1

## 2015-03-01 MED ORDER — FLUOXETINE HCL 20 MG PO CAPS
40.0000 mg | ORAL_CAPSULE | Freq: Every day | ORAL | Status: DC
  Administered 2015-03-01 – 2015-03-02 (×2): 40 mg via ORAL
  Filled 2015-03-01 (×2): qty 2

## 2015-03-01 MED ORDER — PROMETHAZINE HCL 25 MG/ML IJ SOLN: 25.0000 mg | Freq: Four times a day (QID) | INTRAMUSCULAR | Status: DC | PRN

## 2015-03-01 MED ORDER — MONTELUKAST SODIUM 10 MG PO TABS
10.0000 mg | ORAL_TABLET | Freq: Every day | ORAL | Status: DC
  Administered 2015-03-01: 10 mg via ORAL
  Filled 2015-03-01: qty 1

## 2015-03-01 MED ORDER — PANTOPRAZOLE SODIUM 40 MG PO TBEC
80.0000 mg | DELAYED_RELEASE_TABLET | Freq: Every day | ORAL | Status: DC
  Administered 2015-03-01 – 2015-03-02 (×2): 80 mg via ORAL
  Filled 2015-03-01 (×2): qty 2

## 2015-03-01 MED ORDER — FAMOTIDINE 20 MG PO TABS: 20.0000 mg | ORAL_TABLET | Freq: Two times a day (BID) | ORAL | Status: DC

## 2015-03-01 MED ORDER — ONDANSETRON 4 MG PO TBDP: 4.0000 mg | ORAL_TABLET | Freq: Three times a day (TID) | ORAL | Status: DC | PRN

## 2015-03-01 MED ORDER — ACETAMINOPHEN 650 MG RE SUPP: 650.0000 mg | Freq: Four times a day (QID) | RECTAL | Status: DC | PRN

## 2015-03-01 NOTE — H&P (Signed)
Family Medicine Teaching [facility] Admission History and Physical Service Pager: [phone]  Patient name: [name] Medical record number: [id number] Date of birth: 1965-11-23 Age: 50 y.o. Gender: female  Primary Care Provider: Devota Pace, MD Consultants: none Code Status: Full   Chief Complaint: nausea and vomiting  Assessment and Plan: [name] is a 50 y.o. female presenting with nausea and vomting . PMH is significant for myasthenia, sarcoid, HTN, DM, pulmonary fibrosis  Nausea/ Vomiting: Acute on chronic nausea and vomiting. Was recently discharged from the hospital on 1/31 for CAP. Had nausea and vomiting at that time which improved prior to discharge, was sent home with Zofran and Phenergan. Abdominal exam shows some tenderness to bilateral lower quadrants with deep palpation. CMP WNL except for hyperglycemia. CBC WNL except for increased WBC to 14.5. History of IBS and cholecystectomy. Normal colonoscopy and EGD in 2015. Recently had an abominal CT scan on 1/26 which showed hepatic steatosis, otherwise normal. Suspect gastroparesis in the setting of uncontrolled T2DM due to chronic nature of nausea and vomiting. Other differentials include gastritis vs constipation (pt has not had BM in 4 days). Currently VSS and patient afebrile. No significant electrolyte abnormalities on initial labs. - Admit to FPTS, attending Dr. Deirdre Priest - Zofran 4 mg q 8 PRN, Phenergan 25 mg q 6 PRN - Ducolax suppository  - Consider GI emptying study, was going to perform at last hospitalization but nausea and vomiting improved - KUB, assess for stool burden - IVF for rehydration; 1L NS in ED + 1L NS on admission + 122ml/hr   Chest Pain: Endorses similar chest pain as previous admission. Was treated for pneumonia and discharged on 1/31. CXR today shows no acute process. EKG shows sinus tachycardia, otherwise WNL.Trop negative. Normal O2 sat on RA. Pain could be secondary to vomiting.  Denies exertional association. No LE tenderness. Exacerbated w/ vomiting. Patient states pain is identical from previous CP (last admission) in which ACS was ruled out. - Continue to monitor - GI cocktail - Protonix   DM: A1c 7.8 on 02/12/15, on metformin at home - hold metformin while inpatient - sensitive SSI  - CBGs  Leukocytosis: WBC 14.5;  improved from previous collection prior to DC from previous admission. Likely 2/2 recent increase in prednisone dose. Possible infectious cause.  - Will monitor.   Sarcoid/pulmonary fibrosis/asthma?: chronic prednisone 10mg  daily, unclear exact nature of lung disease - continue home prednisone, will not stress dose now as quite hypertensive, if she gets sicker would reconsider - continue home symbicort, singulair  Myasthenia Gravis: on prednisone 10 mg at home. - steroid as above  FEN/GI: NS 125 cc/hr/ NPO Prophylaxis: Heparin  Disposition: Admit to FPTS, attending Dr.  History of Present Illness:  [name] is a 50 y.o. female presenting with nausea and vomiting. Patient states that nausea and vomiting has been persistent since last admsision to hospital. Was recently discharged on 1/31 and at that time was tolerating PO. Was sent home with Zofran and Phenergan. Pt states that she has only been taking Zofran and it is not helping. Has been unable to keep any food down or take any of her medications. Emesis was "egg yolk" in color per family member. Denies fevers, diarrhea, hematemesis, or hematochezia. Does endorse some chest pain that she is unable to further characterize.   Review Of Systems: Per HPI with the following additions: see HPI Otherwise the remainder of the systems were negative.  Patient Active Problem List  Diagnosis Date Noted  . Intractable vomiting 03/01/2015  . Nausea and vomiting 03/01/2015  . Pain in the chest   . Nausea & vomiting   . CAP (community acquired pneumonia) 02/20/2015  . Rib pain on left  side 06/02/2014  . Encounter for chronic pain management 02/11/2014  . Diabetes (HCC) 02/11/2014  . Abdominal pain, epigastric   . Hematochezia   . Internal and external bleeding hemorrhoids   . Chest pain 11/06/2013  . Blood in stool 11/06/2013  . ANA titer 1:40 11/06/2013  . Diarrhea 10/16/2013  . Shortness of breath 10/06/2013  . DOE (dyspnea on exertion) 10/06/2013  . Cough 05/26/2013  . Rash 12/27/2012  . Cystocele 09/14/2012  . Contraceptive management 08/15/2012  . Headache(784.0) 11/02/2011  . Sarcoid (HCC) 12/23/2010  . Knee pain, bilateral 05/28/2010  . 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  . GLUCOSE INTOLERANCE 10/07/2008  . VOCAL CORD DISORDER 09/26/2008  . ALLERGIC RHINITIS 05/30/2008  . 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 Unspecified asthma(493.90) 12/01/2007  . 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 OSA (obstructive sleep apnea) 10/30/2007  . Morbid obesity (HCC) 04/10/2007  . MYASTHENIA 03/07/2007  . SYMPTOM, INCONTINENCE, MIXED, URGE/STRESS 05/30/2006  . DEPRESSIVE DISORDER, NOS 03/24/2006  . HYPERTENSION, BENIGN SYSTEMIC 03/24/2006  . GASTROESOPHAGEAL REFLUX, NO ESOPHAGITIS 03/24/2006    Past Medical History: Past Medical History  Diagnosis Date  . Myasthenia gravis 1994    Positive Acetylcholine receptor Ab and single fiber EMG Dr. Clarisa [facility]- Dr. Noreene Filbert 1998, Dr Sharene Skeans 2008 [facility]- Failed prednisone due to weight gain, stopped imuran/mestinon due to finances- Now with quiescent disease per Dr Sharene Skeans and no flares in many years  . 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 Coralyn Helling- 10/30/2007 ANA positive, ANA titer  negative, RF less than 20  . ALLERGIC RHINITIS     takes Zyrtec daily  . OSA (obstructive sleep apnea)     RDI 10 on PSG 10/09  . Hypoventilation associated with obesity syndrome (HCC)   . Pelvic inflammatory disease (PID)   .  Viral meningitis     history of viral meningitis  . Irritable bladder   . Hypertension     takes Prinizide daily and Clonidine on Mondays  . Asthma     Albuterol prn;Symbicort daily and Singulair daily  . Shortness of breath     can be sitting as well as exertion  . Bronchitis     hx of  . Headache(784.0)     couple of times a week  . Dizziness     pt states she gets off balance occasionally  . Arthritis   . Joint pain   . Joint swelling   . Myasthenia gravis   . Chronic back pain   . Osteoarthritis   . Rheumatoid arthritis(714.0)   . Carpal tunnel syndrome     right  . Eczema   . Trigger finger   . GERD (gastroesophageal reflux disease)     takes Omeprazole daily  . Peptic ulcer   . Constipation     Miralax prn  . Hemorrhoids   . Urinary frequency     takes Ditropan daily  . Urinary urgency   . Nocturia   . Anxiety   . Depression     takes Cymbalta daily  . Insomnia   . Lung disease     scleraderma  . Skin irritation     skin itchy  .  Diabetes (HCC)   . Gallstones 2010  . Pneumonia     hx of;last time about 1-77yrs ago  . CAP (community acquired pneumonia) 02/20/2015    Past Surgical History: Past Surgical History  Procedure Laterality Date  . Cesarean section  09/15/2004  . Bronchoscopy  08/2007  . Thymus resection  08/25/1993  . Laparoscopic cholecystectomy  07/2008    by Dr. Cyndia Bent  . Cortisone injection      receives an injection every 46months  . Esophagogastroduodenoscopy    . Trigger finger release  05/20/2011    Procedure: RELEASE TRIGGER FINGER/A-1 PULLEY;  Surgeon: [name], MD;  Location: [facility] OR;  Service: Orthopedics;  Laterality: Right;  RIGHT TRIGGER THUMB RELEASE AND RIGHT CARPAL TUNNEL RELEASE  . Carpal tunnel release  05/20/2011    Procedure: CARPAL TUNNEL RELEASE;  Surgeon: [name], MD;  Location: [facility] OR;  Service: Orthopedics;  Laterality: Right;  . Esophagogastroduodenoscopy N/A 01/21/2014     Procedure: ESOPHAGOGASTRODUODENOSCOPY (EGD);  Surgeon: Iva Boop, MD;  Location: [facility];  Service: Endoscopy;  Laterality: N/A;  . Colonoscopy N/A 01/21/2014    Procedure: COLONOSCOPY;  Surgeon: Iva Boop, MD;  Location: [facility] ENDOSCOPY;  Service: Endoscopy;  Laterality: N/A;    Social History: Social History  Substance Use Topics  . Smoking status: Never Smoker   . Smokeless tobacco: Never Used  . Alcohol Use: No   Additional social history: none Please also refer to relevant sections of EMR.  Family History: Family History  Problem Relation Age of Onset  . Hypertension Father   . Sickle cell trait Father   . Stroke Paternal Grandmother   . Sickle cell trait Paternal Grandfather   . Lupus Other     Mother's first cousin  . Anesthesia problems Neg Hx   . Hypotension Neg Hx   . Malignant hyperthermia Neg Hx   . Pseudochol deficiency Neg Hx   . Crohn's disease Paternal Aunt   . Diabetes Father     Borderline  . Diabetes Paternal Grandfather   . Diabetes Maternal Grandmother   . Colon cancer Neg Hx   . Colon polyps Neg Hx   . Heart disease Neg Hx   . Kidney disease Neg Hx   . Esophageal cancer Neg Hx   . Gallbladder disease Neg Hx      Allergies and Medications: Allergies  Allergen Reactions  . Apple Other (See Comments)    "mouth itch" - lumps on tongue  . Banana Other (See Comments)    "whelps on tongue"  . Shrimp [Shellfish Allergy] Nausea And Vomiting and Swelling   No current facility-administered medications on file prior to encounter.   Current Outpatient Prescriptions on File Prior to Encounter  Medication Sig Dispense Refill  . albuterol (PROAIR HFA) 108 (90 BASE) MCG/ACT inhaler Inhale 2 puffs into the lungs every 4 (four) hours as needed. (Patient taking differently: Inhale 2 puffs into the lungs every 4 (four) hours as needed for wheezing or shortness of breath. ) 8.5 Inhaler 2  . budesonide-formoterol (SYMBICORT) 160-4.5 MCG/ACT inhaler  Inhale 2 puffs into the lungs 2 (two) times daily as needed (for shortness of breath). 1 Inhaler 11  . cetirizine (ZYRTEC) 10 MG tablet Take 1 tablet (10 mg total) by mouth daily. 30 tablet 11  . dicyclomine (BENTYL) 20 MG tablet Take 1 tablet (20 mg total) by mouth every 6 (six) hours as needed for spasms (abdominal pain). 120 tablet 5  . etodolac (  LODINE) 400 MG tablet Take 400 mg by mouth 2 (two) times daily.    Marland Kitchen FLUoxetine (PROZAC) 40 MG capsule Take 1 capsule (40 mg total) by mouth daily. 30 capsule 11  . lisinopril-hydrochlorothiazide (PRINZIDE,ZESTORETIC) 20-12.5 MG per tablet Take 1 tablet by mouth daily. 30 tablet 11  . medroxyPROGESTERone (DEPO-PROVERA) 150 MG/ML injection Inject 150 mg into the muscle every 3 (three) months.     . metFORMIN (GLUCOPHAGE) 850 MG tablet Take 1 tablet (850 mg total) by mouth 2 (two) times daily with a meal. 180 tablet 1  . montelukast (SINGULAIR) 10 MG tablet Take 1 tablet (10 mg total) by mouth at bedtime. 30 tablet 5  . omeprazole (PRILOSEC) 40 MG capsule TAKE 1 CAPSULE (40 MG TOTAL) BY MOUTH 2 (TWO) TIMES DAILY. 60 capsule 2  . ondansetron (ZOFRAN-ODT) 4 MG disintegrating tablet Take 1 tablet (4 mg total) by mouth every 8 (eight) hours as needed for nausea or vomiting. 12 tablet 0  . oxybutynin (DITROPAN) 5 MG tablet Take 1 tablet (5 mg total) by mouth 2 (two) times daily. 60 tablet 3  . oxyCODONE (OXY IR/ROXICODONE) 5 MG immediate release tablet Take 1 tablet (5 mg total) by mouth every 8 (eight) hours as needed for severe pain. Do not fill until 30 days after this prescription date. 90 tablet 0  . polyethylene glycol (MIRALAX / GLYCOLAX) packet Take 17 g by mouth daily as needed for mild constipation.    . predniSONE (DELTASONE) 10 MG tablet Take two tablets with breaksfast for two days, then one tablet daily. (Patient taking differently: Take 10 mg by mouth daily. ) 32 tablet 0  . promethazine (PHENERGAN) 12.5 MG tablet Take 1 tablet (12.5 mg total) by  mouth every 6 (six) hours as needed for nausea or vomiting. 30 tablet 0  . sulindac (CLINORIL) 200 MG tablet Take 1 tablet (200 mg total) by mouth 2 (two) times daily. 180 tablet 0  . Wheat Dextrin (BENEFIBER) POWD 2 tablespoons each day 730 g 11    Objective: BP 162/105 mmHg  Pulse 102  Temp(Src) 98.6 F (37 C) (Oral)  Resp 19  SpO2 100% Exam: General: In NAD, laying in bed with face mask on, morbidly obese Eyes: PERRLA, non icteric sclera ENTM: dry/tacky mucous membranes Neck: supple Cardiovascular: RRR, normal s1 and s2, no murmurs Respiratory: normal work of breathing, clear to auscultation bilaterally, no wheezing Abdomen: obese abdomen, normal bowel sounds, tender to deep palpation in bilateral lower quadrants MSK: no edema Skin: no rashes Neuro: alert and oriented x 3, no focal defecits Psych: normal mood, slightly blunted affect  Labs and Imaging: CBC BMET   Recent Labs Lab 02/28/15 1930  WBC 14.5*  HGB 14.3  HCT 41.8  PLT 333    Recent Labs Lab 02/28/15 1930  NA 139  K 3.6  CL 103  CO2 23  BUN 12  CREATININE 0.90  GLUCOSE 177*  CALCIUM 9.4     Dg Chest 2 View  02/28/2015  CLINICAL DATA:  Chest pain EXAM: CHEST  2 VIEW COMPARISON:  02/20/2015 FINDINGS: Previous median sternotomy and CABG procedure. The heart size is mildly enlarged. Atelectasis versus scarring noted in the left base. Right lung is clear. IMPRESSION: 1. Left base scar versus atelectasis. 2. Mild cardiac enlargement. Electronically Signed   By: Signa Kell M.D.   On: 02/28/2015 19:47    Beaulah Dinning, MD 03/01/2015, 2:11 AM PGY-1, [facility] Family Medicine FPTS Intern pager: [phone], text pages welcome  Upper Level Addendum:  I have seen and evaluated this patient along with Dr. Jonathon Jordan and reviewed the above note, making necessary revisions in red.   Kathee Delton, MD,MS,  PGY2 03/01/2015 6:46 AM

## 2015-03-01 NOTE — Progress Notes (Signed)
FPTS Interim Progress Note  S:Patient feeling much better this AM. Had large bowel movement, since then nausea and vomiting has improved. Still feels "queasy". Feels up to drinking liquids but not eating any solids at this time.   O: BP 132/76 mmHg  Pulse 115  Temp(Src) 98.6 F (37 C) (Oral)  Resp 20  Ht 5\' 4"  (1.626 m)  Wt 352 lb 11.8 oz (160 kg)  BMI 60.52 kg/m2  SpO2 98%  General: In NAD, sitting up on side of the bed, obese Cardiovascular: RRR, normal s1 and s2, no murmurs Respiratory: normal work of breathing, clear to auscultation bilaterally, no wheezing Abdomen: obese abdomen, normal bowel sounds, non tender to palpation in all four quadrants  A/P: Nausea and Vomiting: Improved after bowel movement. Will continue Phenergan and Zofran PRN. Advance diet to clears as patient will attempt oral rehydration. Follow up on KUB to check for stool burden. Will begin Reglan daily to improve gastric motility.   , MD 03/01/2015, 9:30 AM PGY-1, [facility] Family Medicine Service pager [phone]

## 2015-03-01 NOTE — ED Provider Notes (Signed)
CSN: [id number]     Arrival date & time 02/28/15  1907 History   First MD Initiated Contact with Patient 02/28/15 2233     Chief Complaint  Patient presents with  . Chest Pain     (Consider location/radiation/quality/duration/timing/severity/associated sxs/prior Treatment) HPI Patient has an recurrent vomiting for the past day. This is similar to symptoms she had prior to her last admission. She reports she has epigastric pain burning in quality. There is no associated diarrhea or fever. She reports the emesis as been yellow. He reports she feels fatigued and aches all over. She reports she's been unable to take her medications. She reports Zofran is ineffective for her. Past Medical History  Diagnosis Date  . Myasthenia gravis 1994    Positive Acetylcholine receptor Ab and single fiber EMG Dr. Clarisa [facility]- Dr. Noreene Filbert 1998, Dr Sharene Skeans 2008 [facility]- Failed prednisone due to weight gain, stopped imuran/mestinon due to finances- Now with quiescent disease per Dr Sharene Skeans and no flares in many years  . 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 Coralyn Helling- 10/30/2007 ANA positive, ANA titer  negative, RF less than 20  . ALLERGIC RHINITIS     takes Zyrtec daily  . OSA (obstructive sleep apnea)     RDI 10 on PSG 10/09  . Hypoventilation associated with obesity syndrome (HCC)   . Pelvic inflammatory disease (PID)   . Viral meningitis     history of viral meningitis  . Irritable bladder   . Hypertension     takes Prinizide daily and Clonidine on Mondays  . Asthma     Albuterol prn;Symbicort daily and Singulair daily  . Shortness of breath     can be sitting as well as exertion  . Bronchitis     hx of  . Headache(784.0)     couple of times a week  . Dizziness     pt states she gets off balance occasionally  . Arthritis   . Joint pain   . Joint swelling   . Myasthenia gravis   . Chronic back pain   . Osteoarthritis   .  Rheumatoid arthritis(714.0)   . Carpal tunnel syndrome     right  . Eczema   . Trigger finger   . GERD (gastroesophageal reflux disease)     takes Omeprazole daily  . Peptic ulcer   . Constipation     Miralax prn  . Hemorrhoids   . Urinary frequency     takes Ditropan daily  . Urinary urgency   . Nocturia   . Anxiety   . Depression     takes Cymbalta daily  . Insomnia   . Lung disease     scleraderma  . Skin irritation     skin itchy  . Diabetes (HCC)   . Gallstones 2010  . Pneumonia     hx of;last time about 1-16yrs ago  . CAP (community acquired pneumonia) 02/20/2015   Past Surgical History  Procedure Laterality Date  . Cesarean section  09/15/2004  . Bronchoscopy  08/2007  . Thymus resection  08/25/1993  . Laparoscopic cholecystectomy  07/2008    by Dr. Cyndia Bent  . Cortisone injection      receives an injection every 38months  . Esophagogastroduodenoscopy    . Trigger finger release  05/20/2011    Procedure: RELEASE TRIGGER FINGER/A-1 PULLEY;  Surgeon: [name], MD;  Location: [facility] OR;  Service: Orthopedics;  Laterality: Right;  RIGHT TRIGGER THUMB RELEASE AND RIGHT CARPAL TUNNEL RELEASE  . Carpal tunnel release  05/20/2011    Procedure: CARPAL TUNNEL RELEASE;  Surgeon: [name], MD;  Location: [facility] OR;  Service: Orthopedics;  Laterality: Right;  . Esophagogastroduodenoscopy N/A 01/21/2014    Procedure: ESOPHAGOGASTRODUODENOSCOPY (EGD);  Surgeon: Iva Boop, MD;  Location: [facility];  Service: Endoscopy;  Laterality: N/A;  . Colonoscopy N/A 01/21/2014    Procedure: COLONOSCOPY;  Surgeon: Iva Boop, MD;  Location: [facility] END[facility]COPY;  Service: Endoscopy;  Laterality: N/A;   Family History  Problem Relation Age of Onset  . Hypertension Father   . Sickle cell trait Father   . Stroke Paternal Grandmother   . Sickle cell trait Paternal Grandfather   . Lupus Other     Mother's first cousin  . Anesthesia problems Neg Hx   .  Hypotension Neg Hx   . Malignant hyperthermia Neg Hx   . Pseudochol deficiency Neg Hx   . Crohn's disease Paternal Aunt   . Diabetes Father     Borderline  . Diabetes Paternal Grandfather   . Diabetes Maternal Grandmother   . Colon cancer Neg Hx   . Colon polyps Neg Hx   . Heart disease Neg Hx   . Kidney disease Neg Hx   . Esophageal cancer Neg Hx   . Gallbladder disease Neg Hx    Social History  Substance Use Topics  . Smoking status: Never Smoker   . Smokeless tobacco: Never Used  . Alcohol Use: No   OB History    No data available     Review of Systems 10 Systems reviewed and are negative for acute change except as noted in the HPI.    Allergies  Apple; Banana; and Shrimp  Home Medications   Prior to Admission medications   Medication Sig Start Date End Date Taking? Authorizing Provider  albuterol (PROAIR HFA) 108 (90 BASE) MCG/ACT inhaler Inhale 2 puffs into the lungs every 4 (four) hours as needed. Patient taking differently: Inhale 2 puffs into the lungs every 4 (four) hours as needed for wheezing or shortness of breath.  11/11/14  Yes Yolande Jolly, MD  budesonide-formoterol (SYMBICORT) 160-4.5 MCG/ACT inhaler Inhale 2 puffs into the lungs 2 (two) times daily as needed (for shortness of breath). 03/26/14  Yes Glori Luis, MD  cetirizine (ZYRTEC) 10 MG tablet Take 1 tablet (10 mg total) by mouth daily. 03/26/14  Yes Glori Luis, MD  dicyclomine (BENTYL) 20 MG tablet Take 1 tablet (20 mg total) by mouth every 6 (six) hours as needed for spasms (abdominal pain). 01/21/14  Yes Iva Boop, MD  etodolac (LODINE) 400 MG tablet Take 400 mg by mouth 2 (two) times daily. 10/08/13  Yes Tommie Sams, DO  FLUoxetine (PROZAC) 40 MG capsule Take 1 capsule (40 mg total) by mouth daily. 01/28/14  Yes Glori Luis, MD  lisinopril-hydrochlorothiazide (PRINZIDE,ZESTORETIC) 20-12.5 MG per tablet Take 1 tablet by mouth daily. 03/26/14  Yes Glori Luis, MD   medroxyPROGESTERone (DEPO-PROVERA) 150 MG/ML injection Inject 150 mg into the muscle every 3 (three) months.    Yes Historical Provider, MD  metFORMIN (GLUCOPHAGE) 850 MG tablet Take 1 tablet (850 mg total) by mouth 2 (two) times daily with a meal. 11/11/14  Yes Hillery Hunter Melancon, MD  montelukast (SINGULAIR) 10 MG tablet Take 1 tablet (10 mg total) by mouth at bedtime. 11/11/14  Yes Yolande Jolly, MD  omeprazole (  PRILOSEC) 40 MG capsule TAKE 1 CAPSULE (40 MG TOTAL) BY MOUTH 2 (TWO) TIMES DAILY. 07/15/14  Yes Glori Luis, MD  ondansetron (ZOFRAN-ODT) 4 MG disintegrating tablet Take 1 tablet (4 mg total) by mouth every 8 (eight) hours as needed for nausea or vomiting. 02/25/15  Yes [name], MD  oxybutynin (DITROPAN) 5 MG tablet Take 1 tablet (5 mg total) by mouth 2 (two) times daily. 09/24/14  Yes Yolande Jolly, MD  oxyCODONE (OXY IR/ROXICODONE) 5 MG immediate release tablet Take 1 tablet (5 mg total) by mouth every 8 (eight) hours as needed for severe pain. Do not fill until 30 days after this prescription date. 02/12/15  Yes Yolande Jolly, MD  polyethylene glycol (MIRALAX / GLYCOLAX) packet Take 17 g by mouth daily as needed for mild constipation.   Yes Historical Provider, MD  predniSONE (DELTASONE) 10 MG tablet Take two tablets with breaksfast for two days, then one tablet daily. Patient taking differently: Take 10 mg by mouth daily.  02/25/15  Yes [name], MD  promethazine (PHENERGAN) 12.5 MG tablet Take 1 tablet (12.5 mg total) by mouth every 6 (six) hours as needed for nausea or vomiting. 02/19/15  Yes Yolande Jolly, MD  sulindac (CLINORIL) 200 MG tablet Take 1 tablet (200 mg total) by mouth 2 (two) times daily. 02/04/15  Yes Yolande Jolly, MD  Wheat Dextrin (BENEFIBER) POWD 2 tablespoons each day 01/21/14  Yes Iva Boop, MD  famotidine (PEPCID) 20 MG tablet Take 1 tablet (20 mg total) by mouth 2 (two) times daily. 03/01/15   Arby Barrette, MD  ondansetron (ZOFRAN ODT)  4 MG disintegrating tablet 4mg  ODT q4 hours prn nausea/vomit 03/01/15   04/29/15, MD  promethazine (PHENERGAN) 25 MG suppository Place 1 suppository (25 mg total) rectally every 6 (six) hours as needed for nausea or vomiting. 03/01/15   04/29/15, MD   BP 178/110 mmHg  Pulse 101  Temp(Src) 98.6 F (37 C) (Oral)  Resp 16  SpO2 100% Physical Exam  Constitutional: She is oriented to person, place, and time.  Patient is morbidly obese. She is nontoxic. She is intermittently retching. No respiratory distress.  HENT:  Head: Normocephalic and atraumatic.  Eyes: EOM are normal. Pupils are equal, round, and reactive to light.  Neck: Neck supple.  Cardiovascular: Normal rate, regular rhythm, normal heart sounds and intact distal pulses.   Pulmonary/Chest: Effort normal and breath sounds normal.  Abdominal: Soft. Bowel sounds are normal. She exhibits no distension. There is tenderness.  Patient endorses tenderness in the epigastrium. There is no guarding or rebound.  Musculoskeletal: Normal range of motion. She exhibits no edema.  Neurological: She is alert and oriented to person, place, and time. She has normal strength. Coordination normal. GCS eye subscore is 4. GCS verbal subscore is 5. GCS motor subscore is 6.  Skin: Skin is warm, dry and intact.  Psychiatric: She has a normal mood and affect.    ED Course  Procedures (including critical care time) Labs Review Labs Reviewed  BASIC METABOLIC PANEL - Abnormal; Notable for the following:    Glucose, Bld 177 (*)    All other components within normal limits  CBC - Abnormal; Notable for the following:    WBC 14.5 (*)    RDW 15.9 (*)    All other components within normal limits  LIPASE, BLOOD  HEPATIC FUNCTION PANEL  I-STAT TROPOININ, ED    Imaging Review Dg Chest 2 View  02/28/2015  CLINICAL DATA:  Chest pain EXAM: CHEST  2 VIEW COMPARISON:  02/20/2015 FINDINGS: Previous median sternotomy and CABG procedure. The heart size is  mildly enlarged. Atelectasis versus scarring noted in the left base. Right lung is clear. IMPRESSION: 1. Left base scar versus atelectasis. 2. Mild cardiac enlargement. Electronically Signed   By: Signa Kell M.D.   On: 02/28/2015 19:47   I have personally reviewed and evaluated these images and lab results as part of my medical decision-making.   EKG Interpretation   Date/Time:  Friday February 28 2015 19:25:23 EST Ventricular Rate:  111 PR Interval:  126 QRS Duration: 76 QT Interval:  346 QTC Calculation: 470 R Axis:   -3 Text Interpretation:  Sinus tachycardia Otherwise normal ECG agree  Confirmed by Donnald Garre, MD, Lebron Conners [phone]) on 02/28/2015 10:37:55 PM      MDM   Final diagnoses:  Non-intractable vomiting with nausea, vomiting of unspecified type   At this time patient has intractable vomiting. She has epigastric pain but a nonsurgical abdominal examination. She did have CT abdomen on her prior admission without acute abnormality. At this time I do not feel repeat CT is indicated. Patient is been treated with fluids and antiemetics. She continues to report significant nausea and does not feel she will be able to take any of her medications at home. She reports she feels very sick and doesn't feel that she could manage at home. At this time plan will be for admission for intractable nausea and vomiting with hypertension and inability to take home medications.    Arby Barrette, MD 03/01/15 [phone]

## 2015-03-01 NOTE — Progress Notes (Signed)
  RD consulted for nutrition education regarding morbid obesity and IBS.   Wt Readings from Last 10 Encounters:  03/01/15 352 lb 11.8 oz (160 kg)  02/21/15 348 lb 14.4 oz (158.26 kg)  02/12/15 360 lb (163.295 kg)  10/31/14 366 lb 12.8 oz (166.379 kg)  09/11/14 365 lb (165.563 kg)  05/30/14 377 lb (171.006 kg)  05/03/14 372 lb 11.2 oz (169.056 kg)  02/11/14 382 lb 14.4 oz (173.682 kg)  01/22/14 376 lb (170.552 kg)  01/21/14 376 lb 2 oz (170.609 kg)  ]  Body mass index is 60.52 kg/(m^2). Pt meets criteria for Morbid Obesity based on current BMI.  RD provided "Gastroparesis Nutrition Therapy" handout from the Academy of [employer]. Pt reports that she usually eats one large meal daily, sometimes 2 meals. She reports making healthful changes in the past year which have helped her to lose weight; she reports switching to diet soda instead of regular and diluting juice when she drinks it. She reports trying to eat more fruits and vegetables as well and watching her portion sizes.  RD discussed tips for gastroparesis. Recommended eating 4 to 6 small meals instead of one large meal. Encouraged pt to chew food very well and drink water with meals. Reviewed list of recommended foods and foods to avoid. Encouraged pt to limit/avoid high fat foods, cream, butter, and fried foods. Encouraged intake of low fat dairy products. Provided examples of ways to balance meals/snacks and encouraged intake of soft cooked vegetables and soft or canned fruits.  Emphasized the importance of hydration with calorie-free beverages and limiting sugar-sweetened beverages. Encouraged pt to discuss physical activity options with physician. She states that she used to swim and would love to start back again Teach back method used.  Expect good compliance. Pt states that she has a son at home with severe hypertension and kidney issues who needs to follow a low sodium diet. Her daughter is also obese with limited  mobility. She feels that any healthful change she makes would benefit her whole family. RD encouraged pt to try heart healthy recipes from the [employer] at [employer].   Current diet order is Clear Liquids, patient is consuming approximately 75% of meals at this time. She states that her nausea has improved and she is ready to try solid/pureed foods. She states that nausea and vomiting usually resolves within one day. Labs and medications reviewed. No further nutrition interventions warranted at this time. RD contact information provided. If additional nutrition issues arise, please re-consult RD.  Dorothea Ogle RD, LDN Inpatient Clinical Dietitian Pager: [phone] After Hours Pager: [phone]

## 2015-03-01 NOTE — Progress Notes (Signed)
Called ER RN for report. Bed ready.

## 2015-03-02 DIAGNOSIS — D869 Sarcoidosis, unspecified: Secondary | ICD-10-CM | POA: Diagnosis not present

## 2015-03-02 DIAGNOSIS — D72829 Elevated white blood cell count, unspecified: Secondary | ICD-10-CM | POA: Diagnosis not present

## 2015-03-02 DIAGNOSIS — R1110 Vomiting, unspecified: Secondary | ICD-10-CM | POA: Diagnosis not present

## 2015-03-02 LAB — GLUCOSE, CAPILLARY: Glucose-Capillary: 166 mg/dL — ABNORMAL HIGH (ref 65–99)

## 2015-03-02 MED ORDER — SULINDAC 200 MG PO TABS
200.0000 mg | ORAL_TABLET | Freq: Two times a day (BID) | ORAL | Status: DC | PRN
  Filled 2015-03-02: qty 1

## 2015-03-02 MED ORDER — PROMETHAZINE HCL 25 MG RE SUPP: 25.0000 mg | Freq: Four times a day (QID) | RECTAL | Status: DC | PRN

## 2015-03-02 MED ORDER — SULINDAC 200 MG PO TABS
200.0000 mg | ORAL_TABLET | Freq: Two times a day (BID) | ORAL | Status: DC
  Filled 2015-03-02 (×2): qty 1

## 2015-03-02 MED ORDER — HYDROCHLOROTHIAZIDE 12.5 MG PO CAPS: 12.5000 mg | ORAL_CAPSULE | Freq: Every day | ORAL | Status: DC

## 2015-03-02 MED ORDER — FAMOTIDINE 20 MG PO TABS: 20.0000 mg | ORAL_TABLET | Freq: Two times a day (BID) | ORAL | Status: DC

## 2015-03-02 MED ORDER — ONDANSETRON 4 MG PO TBDP: 4.0000 mg | ORAL_TABLET | Freq: Three times a day (TID) | ORAL | Status: DC | PRN

## 2015-03-02 MED ORDER — METOCLOPRAMIDE HCL 10 MG PO TABS: 10.0000 mg | ORAL_TABLET | Freq: Three times a day (TID) | ORAL | Status: DC

## 2015-03-02 MED ORDER — HYDROCHLOROTHIAZIDE 12.5 MG PO CAPS: 12.5000 mg | ORAL_CAPSULE | Freq: Two times a day (BID) | ORAL | Status: DC | PRN

## 2015-03-02 MED ORDER — SULINDAC 200 MG PO TABS: 200.0000 mg | ORAL_TABLET | Freq: Two times a day (BID) | ORAL | Status: DC | PRN

## 2015-03-02 MED ORDER — ONDANSETRON 4 MG PO TBDP: ORAL_TABLET | ORAL | Status: DC

## 2015-03-02 NOTE — Discharge Summary (Signed)
[facility] Discharge Summary  Patient name: [name] Medical record number: [id number] Date of birth: 26-Aug-1965 Age: 50 y.o. Gender: female Date of Admission: 02/28/2015  Date of Discharge: 03/02/15 Admitting Physician: Carney Living, MD  Primary Care Provider: Devota Pace, MD Consultants: none  Indication for Hospitalization: intractable nausea and vomiting, inability to take PO  Discharge Diagnoses/Problem List:  Patient Active Problem List   Diagnosis Date Noted  . Intractable vomiting 03/01/2015  . Nausea and vomiting 03/01/2015  . Intractable vomiting with nausea 03/01/2015  . Abdominal pain   . Leukocytosis   . Sarcoidosis (HCC)   . Pain in the chest   . Nausea & vomiting   . CAP (community acquired pneumonia) 02/20/2015  . Rib pain on left side 06/02/2014  . Encounter for chronic pain management 02/11/2014  . Diabetes (HCC) 02/11/2014  . Abdominal pain, epigastric   . Hematochezia   . Internal and external bleeding hemorrhoids   . Chest pain 11/06/2013  . Blood in stool 11/06/2013  . ANA titer 1:40 11/06/2013  . Diarrhea 10/16/2013  . Shortness of breath 10/06/2013  . DOE (dyspnea on exertion) 10/06/2013  . Cough 05/26/2013  . Rash 12/27/2012  . Cystocele 09/14/2012  . Contraceptive management 08/15/2012  . Headache(784.0) 11/02/2011  . Sarcoid (HCC) 12/23/2010  . Knee pain, bilateral 05/28/2010  . 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  . GLUCOSE INTOLERANCE 10/07/2008  . VOCAL CORD DISORDER 09/26/2008  . ALLERGIC RHINITIS 05/30/2008  . 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 Unspecified asthma(493.90) 12/01/2007  . 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 OSA (obstructive sleep apnea) 10/30/2007  . Morbid obesity (HCC) 04/10/2007  . MYASTHENIA 03/07/2007  . SYMPTOM, INCONTINENCE, MIXED, URGE/STRESS 05/30/2006  . DEPRESSIVE DISORDER, NOS 03/24/2006  . HYPERTENSION, BENIGN  SYSTEMIC 03/24/2006  . GASTROESOPHAGEAL REFLUX, NO ESOPHAGITIS 03/24/2006    Disposition: Home  Discharge Condition: stable  Discharge Exam: see previous progress note  Brief Hospital Course:  [name] is a 50 y.o. female who presented with nausea and vomting . PMH is significant for myasthenia, sarcoid, HTN, DM, pulmonary fibrosis  Nausea and Vomiting: Patient was recently admitted and discharged with the same complaints (1/26-1/31). Vital signs this admission were stable with some labile BP. She had a slightly elevated WBC of 14.5 (may be elevated due to Prednisone use). She was given IVF, Zofran, and promethazine while in house with subsequent improvement in her symptoms. CXR showed no pneumonia. KUB showed no stool burden. Due to suspicions of gastroparesis, patient was started on a 5 day course of Reglan TID with meals. Her diet was advanced as tolerated.  She was discharged home on Phenergan and Zofran as needed for nausea and Reglan to improve gastric motility.   All other chronic medical conditions stable throughout admission and managed with home regimens.  Issues for Follow Up (combined previous issues for follow up in DC summary by Dr. Alanda Slim on 1/31):  1. Assess for resolution of nausea and vomiting. Was sent home with instructions to alternate phenergan and Zofran PRN and complete 5 day course of Reglan TID.  2. CXR on 2/3 showed no pneumonia, no further imaging should be necessary 3. No AKI here, likely will not need CMP and Mag at follow up 4. Labile BP while here, assess BP in outpatient setting and adjust medications accordingly 5. Morbid obesity and comorbidities: consulted CM for personal care service. Follow up on this at follow up. 6. For PCP considerations:  1. Assess whether pt can be weaned off steroids 2. Pt has 3 medications for osteoarthritis: Etodolac, Sulindac, and Oxycodone. Consider tailoring this regimen  Significant Procedures: none  Significant  Labs and Imaging:   Recent Labs Lab 02/24/15 1555 02/28/15 1930  WBC 17.3* 14.5*  HGB 12.8 14.3  HCT 40.2 41.8  PLT 318 333    Recent Labs Lab 02/23/15 0600 02/24/15 0733 02/25/15 0813 02/28/15 1930 02/28/15 2305 03/01/15 0538  NA 143 141  --  139  --  138  K 3.1* 3.9  --  3.6  --  3.5  CL 106 112*  --  103  --  105  CO2 26 23  --  23  --  22  GLUCOSE 183* 167*  --  177*  --  213*  BUN 10 16  --  12  --  9  CREATININE 1.35* 0.87  --  0.90  --  0.72  CALCIUM 8.4* 8.3*  --  9.4  --  8.3*  MG 2.4 2.5* 2.2  --   --   --   ALKPHOS 63  --   --   --  81  --   AST 19  --   --   --  25  --   ALT 15  --   --   --  25  --   ALBUMIN 2.6*  --   --   --  3.7  --     No results found.   Results/Tests Pending at Time of Discharge: none  Discharge Medications:    Medication List    TAKE these medications        albuterol 108 (90 Base) MCG/ACT inhaler  Commonly known as:  PROAIR HFA  Inhale 2 puffs into the lungs every 4 (four) hours as needed.     BENEFIBER Powd  2 tablespoons each day     budesonide-formoterol 160-4.5 MCG/ACT inhaler  Commonly known as:  SYMBICORT  Inhale 2 puffs into the lungs 2 (two) times daily as needed (for shortness of breath).     cetirizine 10 MG tablet  Commonly known as:  ZYRTEC  Take 1 tablet (10 mg total) by mouth daily.     dicyclomine 20 MG tablet  Commonly known as:  BENTYL  Take 1 tablet (20 mg total) by mouth every 6 (six) hours as needed for spasms (abdominal pain).     etodolac 400 MG tablet  Commonly known as:  LODINE  Take 400 mg by mouth 2 (two) times daily.     famotidine 20 MG tablet  Commonly known as:  PEPCID  Take 1 tablet (20 mg total) by mouth 2 (two) times daily.     FLUoxetine 40 MG capsule  Commonly known as:  PROZAC  Take 1 capsule (40 mg total) by mouth daily.     lisinopril-hydrochlorothiazide 20-12.5 MG tablet  Commonly known as:  PRINZIDE,ZESTORETIC  Take 1 tablet by mouth daily.      medroxyPROGESTERone 150 MG/ML injection  Commonly known as:  DEPO-PROVERA  Inject 150 mg into the muscle every 3 (three) months.     metFORMIN 850 MG tablet  Commonly known as:  GLUCOPHAGE  Take 1 tablet (850 mg total) by mouth 2 (two) times daily with a meal.     metoCLOPramide 10 MG tablet  Commonly known as:  REGLAN  Take 1 tablet (10 mg total) by mouth 3 (three) times daily before meals.     montelukast 10 MG  tablet  Commonly known as:  SINGULAIR  Take 1 tablet (10 mg total) by mouth at bedtime.     omeprazole 40 MG capsule  Commonly known as:  PRILOSEC  TAKE 1 CAPSULE (40 MG TOTAL) BY MOUTH 2 (TWO) TIMES DAILY.     ondansetron 4 MG disintegrating tablet  Commonly known as:  ZOFRAN-ODT  Take 1 tablet (4 mg total) by mouth every 8 (eight) hours as needed for nausea or vomiting.     ondansetron 4 MG disintegrating tablet  Commonly known as:  ZOFRAN-ODT  Take 1 tablet (4 mg total) by mouth every 8 (eight) hours as needed for nausea or vomiting.     ondansetron 4 MG disintegrating tablet  Commonly known as:  ZOFRAN ODT  4mg  ODT q4 hours prn nausea/vomit     oxybutynin 5 MG tablet  Commonly known as:  DITROPAN  Take 1 tablet (5 mg total) by mouth 2 (two) times daily.     oxyCODONE 5 MG immediate release tablet  Commonly known as:  Oxy IR/ROXICODONE  Take 1 tablet (5 mg total) by mouth every 8 (eight) hours as needed for severe pain. Do not fill until 30 days after this prescription date.     polyethylene glycol packet  Commonly known as:  MIRALAX / GLYCOLAX  Take 17 g by mouth daily as needed for mild constipation.     predniSONE 10 MG tablet  Commonly known as:  DELTASONE  Take two tablets with breaksfast for two days, then one tablet daily.     promethazine 12.5 MG tablet  Commonly known as:  PHENERGAN  Take 1 tablet (12.5 mg total) by mouth every 6 (six) hours as needed for nausea or vomiting.     promethazine 25 MG suppository  Commonly known as:  PHENERGAN   Place 1 suppository (25 mg total) rectally every 6 (six) hours as needed for nausea or vomiting.     sulindac 200 MG tablet  Commonly known as:  CLINORIL  Take 1 tablet (200 mg total) by mouth 2 (two) times daily as needed.        Discharge Instructions: Please refer to Patient Instructions section of EMR for full details.  Patient was counseled important signs and symptoms that should prompt return to medical care, changes in medications, dietary instructions, activity restrictions, and follow up appointments.   Follow-Up Appointments: Follow-up Information    Follow up with , MD On 03/04/2015.   Specialty:  Family Medicine   Why:  hospital follow up at 10:30 AM   Contact information:   [address] [place] [phone]       [phone], MD 03/02/2015, 1:34 AM PGY-1, [facility]

## 2015-03-02 NOTE — Progress Notes (Signed)
Family Medicine Teaching Service Daily Progress Note Intern Pager: [phone]  Patient name: [name] Medical record number: [id number] Date of birth: [birth year] Age: 50 y.o. Gender: female  Primary Care Provider: Devota Pace, MD Consultants: none Code Status: Full  Pt Overview and Major Events to Date:  2/4: Admit to FPTS  Assessment and Plan: [name] is a 50 y.o. female presenting with nausea and vomting . PMH is significant for myasthenia, sarcoid, HTN, DM, pulmonary fibrosis  Nausea/ Vomiting: Resolved acute on chronic nausea and vomiting. Leading differentials are gastroparesis vs constipation vs cyclical vomiting syndrome. KUB showed no stool burden yesterday and nausea and vomiting improved with large BM. Abdominal exam benign. Started Reglan TID with meals on 2/4 which patient states has been helping. Has not required any PRN Phenergan or Zofran in 24 hours. Currently VSS and patient afebrile.  - Zofran 4 mg q 8 PRN, Phenergan 25 mg q 6 PRN - Continue Reglan 10 mg TID - ADAT  Chest Pain: Resolved. Suspected resolution with PPI - Continue to monitor - GI cocktail PRN - Protonix 80 mg daily  Knee Pain: Chronic. States her left knee has been hurting her today. Pt says the only thing that helps her is Oxycodone. Was agreeable to try home NSAID first to avoid constipation - Resume home Sulindac 200 mg BID PRN  DM: A1c 7.8 on 02/12/15, on metformin at home - hold metformin while inpatient - sensitive SSI  - CBGs  Leukocytosis: WBC 14.5 yesterday; Likely 2/2 recent increase in prednisone dose.  - Will monitor  Sarcoid/pulmonary fibrosis/asthma?: chronic prednisone 10mg  daily, unclear exact nature of lung disease - continue home prednisone, will not stress dose now as quite hypertensive, if she gets sicker would reconsider - continue home symbicort, singulair  Myasthenia Gravis: on prednisone 10 mg at home. - steroid as above  FEN/GI: SLIV/ soft  diet Prophylaxis: Heparin  Disposition: Home  Subjective:  - No acute events overnight - Nausea and vomiting has resolved - Denies abdominal pain - Able to tolerate more PO. Has been drinking lots of juice and soda. Encouraged pt try try water and diet soda before juice  Objective: Temp:  [98.5 F (36.9 C)-98.7 F (37.1 C)] 98.5 F (36.9 C) (02/05 0537) Pulse Rate:  [81-115] 81 (02/05 0537) Resp:  [18-20] 20 (02/05 0537) BP: (90-132)/(49-76) 106/49 mmHg (02/05 0537) SpO2:  [98 %-100 %] 100 % (02/05 0537) Weight:  [353 lb 6.4 oz (160.3 kg)] 353 lb 6.4 oz (160.3 kg) (02/04 2103) Physical Exam: General: In NAD, laying in bed Cardiovascular: RRR, normal s1 and s2, no murmurs Respiratory: normal work of breathing, clear to auscultation bilaterally, no wheezing Abdomen: obese abdomen, normal bowel sounds, non tender to palpation Extremities: no edema  Laboratory:  Recent Labs Lab 02/24/15 1555 02/28/15 1930  WBC 17.3* 14.5*  HGB 12.8 14.3  HCT 40.2 41.8  PLT 318 333    Recent Labs Lab 02/24/15 0733 02/28/15 1930 02/28/15 2305 03/01/15 0538  NA 141 139  --  138  K 3.9 3.6  --  3.5  CL 112* 103  --  105  CO2 23 23  --  22  BUN 16 12  --  9  CREATININE 0.87 0.90  --  0.72  CALCIUM 8.3* 9.4  --  8.3*  PROT  --   --  7.4  --   BILITOT  --   --  1.2  --   ALKPHOS  --   --  81  --   ALT  --   --  25  --   AST  --   --  25  --   GLUCOSE 167* 177*  --  213*    Imaging/Diagnostic Tests: Abd 1 View (kub)  03/01/2015  CLINICAL DATA:  Generalized abdominal pain, nausea, and vomiting for 2 weeks. EXAM: ABDOMEN - 1 VIEW COMPARISON:  CT abdomen and pelvis 02/20/2015 FINDINGS: Gas is present in nondilated loops of small and large bowel without evidence of obstruction. Limited evaluation for free air on this supine study. Right upper quadrant abdominal surgical clips. Calcified phleboliths in the pelvis. No acute osseous abnormality identified. IMPRESSION: Nonobstructed bowel  gas pattern. Electronically Signed   By: Sebastian Ache M.D.   On: 03/01/2015 09:46     Beaulah Dinning, MD 03/02/2015, 7:17 AM PGY-1, [facility] FPTS Intern pager: [phone], text pages welcome

## 2015-03-02 NOTE — Progress Notes (Signed)
RT has attempted to set up CPAP. Patient states she is not ready. She said she would call when or if she wanted to wear it. Patient in no distress at this time.

## 2015-03-02 NOTE — Discharge Instructions (Signed)
You were admitted to the hospital for nausea and vomiting. You were given IV fluids and medicine to help your nausea. You were also started on a medicine known as Reglan to help your stomach move food down to your intestines.   If you start to feel nauseated again, start with Zofran as prescribed on the bottle. If that does not help, take Phenergan as prescribed on that bottle. If the phenergan does not help, please call the after hours [facility] line before going to the ER.   Continue to follow the nutritionist's recommendations of eating 4-6 smaller meals throughout the day instead of 1 large meal, that will help you digest your food better and become less nauseated.   You will need to take Reglan for the next 5 days (three times daily before meals).   Please go to your follow up appointment listed above.   It was a pleasure taking care of you, take care!  Nausea and Vomiting Nausea is a sick feeling that often comes before throwing up (vomiting). Vomiting is a reflex where stomach contents come out of your mouth. Vomiting can cause severe loss of body fluids (dehydration). Children and elderly adults can become dehydrated quickly, especially if they also have diarrhea. Nausea and vomiting are symptoms of a condition or disease. It is important to find the cause of your symptoms. CAUSES   Direct irritation of the stomach lining. This irritation can result from increased acid production (gastroesophageal reflux disease), infection, food poisoning, taking certain medicines (such as nonsteroidal anti-inflammatory drugs), alcohol use, or tobacco use.  Signals from the brain.These signals could be caused by a headache, heat exposure, an inner ear disturbance, increased pressure in the brain from injury, infection, a tumor, or a concussion, pain, emotional stimulus, or metabolic problems.  An obstruction in the gastrointestinal tract (bowel obstruction).  Illnesses such as diabetes,  hepatitis, gallbladder problems, appendicitis, kidney problems, cancer, sepsis, atypical symptoms of a heart attack, or eating disorders.  Medical treatments such as chemotherapy and radiation.  Receiving medicine that makes you sleep (general anesthetic) during surgery. DIAGNOSIS Your caregiver may ask for tests to be done if the problems do not improve after a few days. Tests may also be done if symptoms are severe or if the reason for the nausea and vomiting is not clear. Tests may include:  Urine tests.  Blood tests.  Stool tests.  Cultures (to look for evidence of infection).  X-rays or other imaging studies. Test results can help your caregiver make decisions about treatment or the need for additional tests. TREATMENT You need to stay well hydrated. Drink frequently but in small amounts.You may wish to drink water, sports drinks, clear broth, or eat frozen ice pops or gelatin dessert to help stay hydrated.When you eat, eating slowly may help prevent nausea.There are also some antinausea medicines that may help prevent nausea. HOME CARE INSTRUCTIONS   Take all medicine as directed by your caregiver.  If you do not have an appetite, do not force yourself to eat. However, you must continue to drink fluids.  If you have an appetite, eat a normal diet unless your caregiver tells you differently.  Eat a variety of complex carbohydrates (rice, wheat, potatoes, bread), lean meats, yogurt, fruits, and vegetables.  Avoid high-fat foods because they are more difficult to digest.  Drink enough water and fluids to keep your urine clear or pale yellow.  If you are dehydrated, ask your caregiver for specific rehydration instructions. Signs of  dehydration may include:  Severe thirst.  Dry lips and mouth.  Dizziness.  Dark urine.  Decreasing urine frequency and amount.  Confusion.  Rapid breathing or pulse. SEEK IMMEDIATE MEDICAL CARE IF:   You have blood or brown flecks  (like coffee grounds) in your vomit.  You have black or bloody stools.  You have a severe headache or stiff neck.  You are confused.  You have severe abdominal pain.  You have chest pain or trouble breathing.  You do not urinate at least once every 8 hours.  You develop cold or clammy skin.  You continue to vomit for longer than 24 to 48 hours.  You have a fever. MAKE SURE YOU:   Understand these instructions.  Will watch your condition.  Will get help right away if you are not doing well or get worse.   This information is not intended to replace advice given to you by your health care provider. Make sure you discuss any questions you have with your health care provider.   Document Released: 01/11/2005 Document Revised: 04/05/2011 Document Reviewed: 06/10/2010 Elsevier Interactive Patient Education [employer]

## 2015-03-02 NOTE — Progress Notes (Signed)
BP 95/59 HR 90. Spoke with Dr. Natale Milch.  Will hold am dose today of lisinopril and hydrochlorothiazide per MD order.  OK for patient to discharge home per Dr. Natale Milch.

## 2015-03-02 NOTE — Progress Notes (Signed)
Discharge instructions and medications discussed with patient.  All questions answered.  

## 2015-03-03 LAB — URINE CULTURE

## 2015-03-04 ENCOUNTER — Inpatient Hospital Stay: Payer: Self-pay | Admitting: Student

## 2015-03-04 ENCOUNTER — Inpatient Hospital Stay: Payer: Self-pay | Admitting: Family Medicine

## 2015-03-06 ENCOUNTER — Other Ambulatory Visit: Payer: Self-pay | Admitting: *Deleted

## 2015-03-06 MED ORDER — PROMETHAZINE HCL 12.5 MG PO TABS: 12.5000 mg | ORAL_TABLET | Freq: Four times a day (QID) | ORAL | Status: DC | PRN

## 2015-03-10 ENCOUNTER — Other Ambulatory Visit: Payer: Self-pay | Admitting: *Deleted

## 2015-03-10 MED ORDER — OMEPRAZOLE 40 MG PO CPDR: DELAYED_RELEASE_CAPSULE | ORAL | Status: DC

## 2015-03-11 ENCOUNTER — Other Ambulatory Visit: Payer: Self-pay | Admitting: *Deleted

## 2015-03-11 MED ORDER — FUROSEMIDE 20 MG PO TABS: 40.0000 mg | ORAL_TABLET | Freq: Two times a day (BID) | ORAL | Status: DC

## 2015-03-14 ENCOUNTER — Ambulatory Visit (INDEPENDENT_AMBULATORY_CARE_PROVIDER_SITE_OTHER): Payer: Medicaid Other | Admitting: Family Medicine

## 2015-03-14 ENCOUNTER — Encounter: Payer: Self-pay | Admitting: Family Medicine

## 2015-03-14 VITALS — BP 142/90 | HR 114 | Temp 98.6°F | Ht 64.0 in | Wt 370.0 lb

## 2015-03-14 DIAGNOSIS — M542 Cervicalgia: Secondary | ICD-10-CM

## 2015-03-14 MED ORDER — MELOXICAM 15 MG PO TABS: 15.0000 mg | ORAL_TABLET | Freq: Every day | ORAL | Status: DC

## 2015-03-14 NOTE — Patient Instructions (Signed)
Thanks for coming in today.   We will send in a prescription for mobic for your shoulder. Stop taking the Sulindac.   Get the x-rays at [facility].   You will be referred to physical therapy and called with an appointment.   Make a follow up appointment in one month so we can follow up on your shoulder pain.   Thanks for letting us take care of you.   Sincerely, Devota Pace, MD Family Medicine - PGY 2

## 2015-03-18 NOTE — Progress Notes (Signed)
Patient ID: [name], female   DOB: 01-Oct-1965, 50 y.o.   MRN: [id number]   [facility] Yolande Jolly, MD Phone: [phone]  Subjective:   # Right Shoulder Pain - pt. Has pain extending from her neck to her posterior right shoulder - ongoing for 6 months.  - No weakness - has tried heating pads, sulindac, without relief.  - pain stops at her shoulder - full range of shoulder motion - No shooting pain down her arm, no numbness, and no grip strength weakness.  - Worse when she turns her head to the right.  - No injury that she knows of.  - Able to lift her arms over her head.  - No recent increase in exercise.  - No steroid use - No weight loss, fatigue, decreased appetite, or night sweats.  - No history of cancer.   All relevant systems were reviewed and were negative unless otherwise noted in the HPI  Past Medical History Reviewed problem list.  Medications- reviewed and updated Current Outpatient Prescriptions  Medication Sig Dispense Refill  . albuterol (PROAIR HFA) 108 (90 BASE) MCG/ACT inhaler Inhale 2 puffs into the lungs every 4 (four) hours as needed. (Patient taking differently: Inhale 2 puffs into the lungs every 4 (four) hours as needed for wheezing or shortness of breath. ) 8.5 Inhaler 2  . budesonide-formoterol (SYMBICORT) 160-4.5 MCG/ACT inhaler Inhale 2 puffs into the lungs 2 (two) times daily as needed (for shortness of breath). 1 Inhaler 11  . cetirizine (ZYRTEC) 10 MG tablet Take 1 tablet (10 mg total) by mouth daily. 30 tablet 11  . dicyclomine (BENTYL) 20 MG tablet Take 1 tablet (20 mg total) by mouth every 6 (six) hours as needed for spasms (abdominal pain). 120 tablet 5  . etodolac (LODINE) 400 MG tablet Take 400 mg by mouth 2 (two) times daily.    . famotidine (PEPCID) 20 MG tablet Take 1 tablet (20 mg total) by mouth 2 (two) times daily. 30 tablet 0  . FLUoxetine (PROZAC) 40 MG capsule Take 1 capsule (40 mg total) by mouth  daily. 30 capsule 11  . furosemide (LASIX) 20 MG tablet Take 2 tablets (40 mg total) by mouth 2 (two) times daily. 120 tablet 1  . lisinopril-hydrochlorothiazide (PRINZIDE,ZESTORETIC) 20-12.5 MG per tablet Take 1 tablet by mouth daily. 30 tablet 11  . medroxyPROGESTERone (DEPO-PROVERA) 150 MG/ML injection Inject 150 mg into the muscle every 3 (three) months.     . meloxicam (MOBIC) 15 MG tablet Take 1 tablet (15 mg total) by mouth daily. 30 tablet 2  . metFORMIN (GLUCOPHAGE) 850 MG tablet Take 1 tablet (850 mg total) by mouth 2 (two) times daily with a meal. 180 tablet 1  . metoCLOPramide (REGLAN) 10 MG tablet Take 1 tablet (10 mg total) by mouth 3 (three) times daily before meals. 25 tablet 0  . montelukast (SINGULAIR) 10 MG tablet Take 1 tablet (10 mg total) by mouth at bedtime. 30 tablet 5  . omeprazole (PRILOSEC) 40 MG capsule TAKE 1 CAPSULE (40 MG TOTAL) BY MOUTH 2 (TWO) TIMES DAILY. 60 capsule 2  . ondansetron (ZOFRAN ODT) 4 MG disintegrating tablet 4mg  ODT q4 hours prn nausea/vomit 12 tablet 0  . ondansetron (ZOFRAN-ODT) 4 MG disintegrating tablet Take 1 tablet (4 mg total) by mouth every 8 (eight) hours as needed for nausea or vomiting. 12 tablet 0  . ondansetron (ZOFRAN-ODT) 4 MG disintegrating tablet Take 1 tablet (4 mg total) by mouth every  8 (eight) hours as needed for nausea or vomiting. 20 tablet 0  . oxybutynin (DITROPAN) 5 MG tablet Take 1 tablet (5 mg total) by mouth 2 (two) times daily. 60 tablet 3  . oxyCODONE (OXY IR/ROXICODONE) 5 MG immediate release tablet Take 1 tablet (5 mg total) by mouth every 8 (eight) hours as needed for severe pain. Do not fill until 30 days after this prescription date. 90 tablet 0  . polyethylene glycol (MIRALAX / GLYCOLAX) packet Take 17 g by mouth daily as needed for mild constipation.    . predniSONE (DELTASONE) 10 MG tablet Take two tablets with breaksfast for two days, then one tablet daily. (Patient taking differently: Take 10 mg by mouth daily. )  32 tablet 0  . promethazine (PHENERGAN) 12.5 MG tablet Take 1 tablet (12.5 mg total) by mouth every 6 (six) hours as needed for nausea or vomiting. 30 tablet 0  . promethazine (PHENERGAN) 25 MG suppository Place 1 suppository (25 mg total) rectally every 6 (six) hours as needed for nausea or vomiting. 12 suppository 0  . sulindac (CLINORIL) 200 MG tablet Take 1 tablet (200 mg total) by mouth 2 (two) times daily as needed. 180 tablet 0  . Wheat Dextrin (BENEFIBER) POWD 2 tablespoons each day 730 g 11   No current facility-administered medications for this visit.   Chief complaint-noted No additions to family history Social history- patient is a non smoker  Objective: BP 142/90 mmHg  Pulse 114  Temp(Src) 98.6 F (37 C) (Oral)  Ht 5\' 4"  (1.626 m)  Wt 370 lb (167.831 kg)  BMI 63.48 kg/m2  SpO2 99% Gen: NAD, alert, cooperative with exam HEENT: NCAT,PERRL Neck: FROM, supple CV: RRR, good S1/S2, no murmur Resp: CTABL, no wheezes, non-labored Abd: SNTND, BS present, no guarding or organomegaly Ext: No edema, warm, normal tone, moves UE/LE spontaneously RUE:  Full passive and active ROM No weakness noted in all major muscle groups, grip strength 5/5 Nontender aroundf the glenohumoral joint Negative hawkins, neers, empty can, lift off.  LUE: FROM, No weakness, nontender, nonfocal.  Neck: some mild TTP over her c-spine. Positive Spurling's, no muscular atrophy noted.  Neuro: Alert and oriented, No gross deficits Skin: no rashes no lesions  Assessment/Plan:  # Right Shoulder Pain / Neck Pain- exam and history is more consistent with nerve compression / inflammation of the c-spine nerve roots. No red flags at this time.  - Conservative management for now.  - Switch from sulindac to mobic - heat / ice - physical therapy as needed.  - start with XRays, may need MRI if worsening. - Follow up in 1 month.  - careful with NSAId's / pain medicine. She is on Oxycodone for chronic pain 2/2  knee arthritis.

## 2015-03-31 ENCOUNTER — Ambulatory Visit: Payer: Medicaid Other | Attending: Family Medicine

## 2015-04-08 ENCOUNTER — Other Ambulatory Visit: Payer: Self-pay | Admitting: *Deleted

## 2015-04-08 DIAGNOSIS — I10 Essential (primary) hypertension: Secondary | ICD-10-CM

## 2015-04-08 MED ORDER — OXYBUTYNIN CHLORIDE 5 MG PO TABS: 5.0000 mg | ORAL_TABLET | Freq: Two times a day (BID) | ORAL | Status: DC

## 2015-04-08 MED ORDER — PREDNISONE 10 MG PO TABS: ORAL_TABLET | ORAL | Status: DC

## 2015-04-08 MED ORDER — CLONIDINE HCL 0.1 MG PO TABS
ORAL_TABLET | ORAL | Status: DC
Start: 2015-04-08 — End: 2015-09-10

## 2015-04-08 MED ORDER — MONTELUKAST SODIUM 10 MG PO TABS: 10.0000 mg | ORAL_TABLET | Freq: Every day | ORAL | Status: DC

## 2015-04-08 MED ORDER — CLONIDINE HCL 0.3 MG/24HR TD PTWK: MEDICATED_PATCH | TRANSDERMAL | Status: DC

## 2015-04-08 MED ORDER — OMEPRAZOLE 40 MG PO CPDR: DELAYED_RELEASE_CAPSULE | ORAL | Status: DC

## 2015-04-08 MED ORDER — LISINOPRIL-HYDROCHLOROTHIAZIDE 20-12.5 MG PO TABS: 1.0000 | ORAL_TABLET | Freq: Every day | ORAL | Status: DC

## 2015-04-08 MED ORDER — FLUOXETINE HCL 40 MG PO CAPS: 40.0000 mg | ORAL_CAPSULE | Freq: Every day | ORAL | Status: DC

## 2015-04-08 MED ORDER — ALBUTEROL SULFATE HFA 108 (90 BASE) MCG/ACT IN AERS: 2.0000 | INHALATION_SPRAY | RESPIRATORY_TRACT | Status: DC | PRN

## 2015-04-11 ENCOUNTER — Telehealth: Payer: Self-pay | Admitting: *Deleted

## 2015-04-11 DIAGNOSIS — I10 Essential (primary) hypertension: Secondary | ICD-10-CM

## 2015-04-11 MED ORDER — LISINOPRIL-HYDROCHLOROTHIAZIDE 20-25 MG PO TABS: 1.0000 | ORAL_TABLET | Freq: Every day | ORAL | Status: DC

## 2015-04-11 NOTE — Telephone Encounter (Signed)
Changed. Thanks  CGM MD

## 2015-04-11 NOTE — Telephone Encounter (Signed)
Received a fax from [facility] needing clarification in the dosage of Lisinopril-HCTZ.  Dosage sent in was 20-12.5 mg.  Patient states it should be 20-25 mg. Please advise.  Clovis Pu, RN

## 2015-04-15 ENCOUNTER — Ambulatory Visit (HOSPITAL_COMMUNITY)
Admission: RE | Admit: 2015-04-15 | Discharge: 2015-04-15 | Disposition: A | Discharge status: Home / Self Care | Payer: Medicaid Other | Source: Ambulatory Visit | Attending: Family Medicine | Admitting: Family Medicine

## 2015-04-15 ENCOUNTER — Ambulatory Visit (INDEPENDENT_AMBULATORY_CARE_PROVIDER_SITE_OTHER): Payer: Medicaid Other | Admitting: Family Medicine

## 2015-04-15 ENCOUNTER — Encounter: Payer: Self-pay | Admitting: Family Medicine

## 2015-04-15 VITALS — BP 145/77 | HR 93 | Temp 99.1°F | Wt 361.2 lb

## 2015-04-15 DIAGNOSIS — M542 Cervicalgia: Secondary | ICD-10-CM | POA: Diagnosis not present

## 2015-04-15 DIAGNOSIS — M6248 Contracture of muscle, other site: Secondary | ICD-10-CM

## 2015-04-15 DIAGNOSIS — M47892 Other spondylosis, cervical region: Secondary | ICD-10-CM | POA: Insufficient documentation

## 2015-04-15 DIAGNOSIS — M62838 Other muscle spasm: Secondary | ICD-10-CM

## 2015-04-15 MED ORDER — CYCLOBENZAPRINE HCL 10 MG PO TABS: 10.0000 mg | ORAL_TABLET | Freq: Three times a day (TID) | ORAL | Status: DC | PRN

## 2015-04-15 NOTE — Patient Instructions (Signed)
Thanks for coming in today.   We still need to get the x-rays of your neck.   We will have you start a trial of flexeril to see if this helps.   I will call you with the result of the neck x-rays.   Follow up in 2 weeks if no better, and after you have gotten the x-rays.   If the x-rays are negative, will get an MRI.   Thanks for letting us take care of you.   Sincerely,  Devota Pace, MD Family Medicine - PGY 2

## 2015-04-16 NOTE — Progress Notes (Signed)
Patient ID: [name], female   DOB: 1965/04/04, 50 y.o.   MRN: [id number]   [facility] Yolande Jolly, MD Phone: [phone]  Subjective:   # Shoulder Pain F/U - pt. With discogenic shoulder / neck pain - seen recently, but has not gone to get the x-rays ordered at last office visit.  - she has been taking the mobic with little relief.  - she continues to have pain in her neck, now with some posterior headaches on that side as well.  - Feels like the muscles are tight.  - no numbness or tingling in her shoulder.  - she has no weakness on the right side.  - she has no shooting pain into her arm.  - denies trauma to that area.  - she has been using heat on the area which is helping, but only works temporarily.  - has not lost any weight.  - is attending classes discussing the possibility of bariatric surgery.    All relevant systems were reviewed and were negative unless otherwise noted in the HPI  Past Medical History Reviewed problem list.  Medications- reviewed and updated Current Outpatient Prescriptions  Medication Sig Dispense Refill  . albuterol (PROAIR HFA) 108 (90 Base) MCG/ACT inhaler Inhale 2 puffs into the lungs every 4 (four) hours as needed. 8.5 Inhaler 2  . budesonide-formoterol (SYMBICORT) 160-4.5 MCG/ACT inhaler Inhale 2 puffs into the lungs 2 (two) times daily as needed (for shortness of breath). 1 Inhaler 11  . cetirizine (ZYRTEC) 10 MG tablet Take 1 tablet (10 mg total) by mouth daily. 30 tablet 11  . cloNIDine (CATAPRES) 0.1 MG tablet TAKE 1 TABLET (0.1 MG TOTAL) BY MOUTH 3 (THREE) TIMES DAILY. ONLY TO BE TAKEN IF PATCH FALLS OFF 30 tablet 1  . cloNIDine (CATAPRES-TTS-3) 0.3 mg/24hr patch PLACE 1 PATCH (0.3 MG TOTAL) ONTO THE SKIN ONCE A WEEK. 4 patch 8  . dicyclomine (BENTYL) 20 MG tablet Take 1 tablet (20 mg total) by mouth every 6 (six) hours as needed for spasms (abdominal pain). 120 tablet 5  . etodolac (LODINE) 400 MG tablet  Take 400 mg by mouth 2 (two) times daily.    Marland Kitchen FLUoxetine (PROZAC) 40 MG capsule Take 1 capsule (40 mg total) by mouth daily. 30 capsule 11  . furosemide (LASIX) 20 MG tablet Take 2 tablets (40 mg total) by mouth 2 (two) times daily. 120 tablet 1  . lisinopril-hydrochlorothiazide (PRINZIDE,ZESTORETIC) 20-25 MG tablet Take 1 tablet by mouth daily. 90 tablet 3  . medroxyPROGESTERone (DEPO-PROVERA) 150 MG/ML injection Inject 150 mg into the muscle every 3 (three) months.     . meloxicam (MOBIC) 15 MG tablet Take 1 tablet (15 mg total) by mouth daily. 30 tablet 2  . metFORMIN (GLUCOPHAGE) 850 MG tablet Take 1 tablet (850 mg total) by mouth 2 (two) times daily with a meal. 180 tablet 1  . montelukast (SINGULAIR) 10 MG tablet Take 1 tablet (10 mg total) by mouth at bedtime. 30 tablet 5  . omeprazole (PRILOSEC) 40 MG capsule TAKE 1 CAPSULE (40 MG TOTAL) BY MOUTH 2 (TWO) TIMES DAILY. 60 capsule 2  . ondansetron (ZOFRAN-ODT) 4 MG disintegrating tablet Take 1 tablet (4 mg total) by mouth every 8 (eight) hours as needed for nausea or vomiting. 20 tablet 0  . oxybutynin (DITROPAN) 5 MG tablet Take 1 tablet (5 mg total) by mouth 2 (two) times daily. 60 tablet 3  . oxyCODONE (OXY IR/ROXICODONE) 5 MG immediate release  tablet Take 1 tablet (5 mg total) by mouth every 8 (eight) hours as needed for severe pain. Do not fill until 30 days after this prescription date. 90 tablet 0  . polyethylene glycol (MIRALAX / GLYCOLAX) packet Take 17 g by mouth daily as needed for mild constipation.    . predniSONE (DELTASONE) 10 MG tablet Take two tablets with breaksfast for two days, then one tablet daily. 32 tablet 0  . promethazine (PHENERGAN) 12.5 MG tablet Take 1 tablet (12.5 mg total) by mouth every 6 (six) hours as needed for nausea or vomiting. 30 tablet 0  . promethazine (PHENERGAN) 25 MG suppository Place 1 suppository (25 mg total) rectally every 6 (six) hours as needed for nausea or vomiting. 12 suppository 0  .  sulindac (CLINORIL) 200 MG tablet Take 1 tablet (200 mg total) by mouth 2 (two) times daily as needed. 180 tablet 0  . cyclobenzaprine (FLEXERIL) 10 MG tablet Take 1 tablet (10 mg total) by mouth 3 (three) times daily as needed for muscle spasms. 90 tablet 0  . metoCLOPramide (REGLAN) 10 MG tablet Take 1 tablet (10 mg total) by mouth 3 (three) times daily before meals. 25 tablet 0   No current facility-administered medications for this visit.   Chief complaint-noted No additions to family history Social history- patient is a non smoker  Objective: BP 145/77 mmHg  Pulse 93  Temp(Src) 99.1 F (37.3 C) (Oral)  Wt 361 lb 3.2 oz (163.839 kg) Gen: NAD, alert, cooperative with exam, morbidly obes HEENT: NCAT, EOMI, PERRL Neck: FROM, supple CV: RRR, good S1/S2, no murmur Resp: CTABL, no wheezes, non-labored Abd: SNTND, BS present, no guarding or organomegaly Ext: No edema, warm, normal tone, moves UE/LE spontaneously, pain with palpation of right shoulder and posterior neck. ROM on the right remains full both passively and actively. Symmetric compared to the left. No atrophy. Palpation / massage actually helps the pain to "feel better" per the patient. No C-spine, spinous process tenderness to palpation. No swelling or erythema.  Neuro: Alert and oriented, No gross deficits Skin: no rashes no lesions  Assessment/Plan:  # Right Neck / Shoulder Pain - continued pain that is primarily neck pain on the right. She has not gotten the imaging discussed since our last visit. Little improvement. Mobic not helping much, heat helps some, but is only temporary.  - She needs to get the x-rays.  - Continue mobic for now, previously switched from ibuprofen.  - Consider tens unit in the future.  - She went to PT, but only had one day funded by her insurance.  - May get MRI if pain continues, and no overt abnormality on X-ray.  - Consider referral if pain remains unrelieved by conservative measures. May  need injection.  - f/u in 2 weeks.

## 2015-04-21 ENCOUNTER — Telehealth: Payer: Self-pay | Admitting: Family Medicine

## 2015-04-21 NOTE — Telephone Encounter (Signed)
Called twice with no answer, and VM box full. Wanted to inform her of her son's normal lab results and negative UDS, as well as her neck Xray. Xray showed slight degenerative change which could be causing her symptoms. No fractures. Will be happy to discuss further with her at her next visit.   CGM MD

## 2015-04-29 ENCOUNTER — Ambulatory Visit (INDEPENDENT_AMBULATORY_CARE_PROVIDER_SITE_OTHER): Payer: Medicaid Other | Admitting: Family Medicine

## 2015-04-29 ENCOUNTER — Encounter: Payer: Self-pay | Admitting: Family Medicine

## 2015-04-29 VITALS — BP 176/104 | HR 109 | Temp 98.8°F | Ht 64.0 in | Wt 355.0 lb

## 2015-04-29 DIAGNOSIS — M542 Cervicalgia: Secondary | ICD-10-CM

## 2015-04-29 DIAGNOSIS — M25511 Pain in right shoulder: Secondary | ICD-10-CM

## 2015-04-29 NOTE — Patient Instructions (Signed)
Thanks for coming in today.   We will get an MRI of your neck.   Continue to take the sulindac.   Do the exercises we discussed.   You need to lose weight, and if you can get the surgery for weight loss then this will be most beneficial.   Thanks for letting us take care of you.   Sincerely,  Devota Pace, MD Family Medicine - PGY 2

## 2015-04-29 NOTE — Progress Notes (Signed)
Patient ID: [name], female   DOB: 07/06/1965, 50 y.o.   MRN: [id number]   [facility] Yolande Jolly, MD Phone: [phone]  Subjective:   # Neck / Shoulder Pain follow up - still having pain on the right side - now also on the left.  - pain is still about the same, not worse, but no better.  - Sometimes gets some numness in he right arm, but no weakness.  - She has had carpal tunnel syndrome on the right in the past s/p release several years ago.  - She is using the sulindac with no relief. Also on Narcotics for pain management.  - Pain is not worse with neck flexion or extension.  - She does get muscle tension in her neck. - Heat / Massage helps, but shortly after stopping this it resumes.   # HTN  - Bp elevated again today.  - No vision changes or chest pain.  - No SOB, No nausea, or diaphoresis.  - She has not picked up the lisinopril -HCTZ combo at all recently.  - She is taking the clonidine.  - Little improvement since last time.  - She thinks it is related to her having her BP checked as soon as she came in.   All relevant systems were reviewed and were negative unless otherwise noted in the HPI  Past Medical History Reviewed problem list.  Medications- reviewed and updated Current Outpatient Prescriptions  Medication Sig Dispense Refill  . albuterol (PROAIR HFA) 108 (90 Base) MCG/ACT inhaler Inhale 2 puffs into the lungs every 4 (four) hours as needed. 8.5 Inhaler 2  . budesonide-formoterol (SYMBICORT) 160-4.5 MCG/ACT inhaler Inhale 2 puffs into the lungs 2 (two) times daily as needed (for shortness of breath). 1 Inhaler 11  . cetirizine (ZYRTEC) 10 MG tablet Take 1 tablet (10 mg total) by mouth daily. 30 tablet 11  . cloNIDine (CATAPRES) 0.1 MG tablet TAKE 1 TABLET (0.1 MG TOTAL) BY MOUTH 3 (THREE) TIMES DAILY. ONLY TO BE TAKEN IF PATCH FALLS OFF 30 tablet 1  . cloNIDine (CATAPRES-TTS-3) 0.3 mg/24hr patch PLACE 1 PATCH (0.3 MG TOTAL)  ONTO THE SKIN ONCE A WEEK. 4 patch 8  . cyclobenzaprine (FLEXERIL) 10 MG tablet Take 1 tablet (10 mg total) by mouth 3 (three) times daily as needed for muscle spasms. 90 tablet 0  . dicyclomine (BENTYL) 20 MG tablet Take 1 tablet (20 mg total) by mouth every 6 (six) hours as needed for spasms (abdominal pain). 120 tablet 5  . etodolac (LODINE) 400 MG tablet Take 400 mg by mouth 2 (two) times daily.    Marland Kitchen FLUoxetine (PROZAC) 40 MG capsule Take 1 capsule (40 mg total) by mouth daily. 30 capsule 11  . furosemide (LASIX) 20 MG tablet Take 2 tablets (40 mg total) by mouth 2 (two) times daily. 120 tablet 1  . lisinopril-hydrochlorothiazide (PRINZIDE,ZESTORETIC) 20-25 MG tablet Take 1 tablet by mouth daily. 90 tablet 3  . medroxyPROGESTERone (DEPO-PROVERA) 150 MG/ML injection Inject 150 mg into the muscle every 3 (three) months.     . meloxicam (MOBIC) 15 MG tablet Take 1 tablet (15 mg total) by mouth daily. 30 tablet 2  . metFORMIN (GLUCOPHAGE) 850 MG tablet Take 1 tablet (850 mg total) by mouth 2 (two) times daily with a meal. 180 tablet 1  . metoCLOPramide (REGLAN) 10 MG tablet Take 1 tablet (10 mg total) by mouth 3 (three) times daily before meals. 25 tablet 0  .  montelukast (SINGULAIR) 10 MG tablet Take 1 tablet (10 mg total) by mouth at bedtime. 30 tablet 5  . omeprazole (PRILOSEC) 40 MG capsule TAKE 1 CAPSULE (40 MG TOTAL) BY MOUTH 2 (TWO) TIMES DAILY. 60 capsule 2  . ondansetron (ZOFRAN-ODT) 4 MG disintegrating tablet Take 1 tablet (4 mg total) by mouth every 8 (eight) hours as needed for nausea or vomiting. 20 tablet 0  . oxybutynin (DITROPAN) 5 MG tablet Take 1 tablet (5 mg total) by mouth 2 (two) times daily. 60 tablet 3  . oxyCODONE (OXY IR/ROXICODONE) 5 MG immediate release tablet Take 1 tablet (5 mg total) by mouth every 8 (eight) hours as needed for severe pain. Do not fill until 30 days after this prescription date. 90 tablet 0  . polyethylene glycol (MIRALAX / GLYCOLAX) packet Take 17 g by  mouth daily as needed for mild constipation.    . predniSONE (DELTASONE) 10 MG tablet Take two tablets with breaksfast for two days, then one tablet daily. 32 tablet 0  . promethazine (PHENERGAN) 12.5 MG tablet Take 1 tablet (12.5 mg total) by mouth every 6 (six) hours as needed for nausea or vomiting. 30 tablet 0  . promethazine (PHENERGAN) 25 MG suppository Place 1 suppository (25 mg total) rectally every 6 (six) hours as needed for nausea or vomiting. 12 suppository 0  . sulindac (CLINORIL) 200 MG tablet Take 1 tablet (200 mg total) by mouth 2 (two) times daily as needed. 180 tablet 0   No current facility-administered medications for this visit.   Chief complaint-noted No additions to family history Social history- patient is a non smoker  Objective: BP 176/104 mmHg  Pulse 109  Temp(Src) 98.8 F (37.1 C) (Oral)  Ht 5\' 4"  (1.626 m)  Wt 355 lb (161.027 kg)  BMI 60.91 kg/m2 Gen: NAD, alert, cooperative with exam, morbid obesity.  HEENT: NCAT, EOMI, PERRL Neck: FROM, supple CV: RRR, good S1/S2, no murmur Resp: CTABL, no wheezes, non-labored Abd: SNTND, BS present, no guarding or organomegaly Ext: No edema, warm, normal tone, moves UE/LE spontaneously.  Difficult exam due to obesity.  Right Arm: FROm to exam, no weakness. No sensory deficits noted.  Neck: Spurling's negative, somewhat tight. No pain with flexion or extension.  Left Arm: FROM to exam, no weakness, no sensory deficits.  Neuro: Alert and oriented, No gross deficits Skin: no rashes no lesions  Assessment/Plan:  # Neck Pain - no improvement since last visit. Taking the sulindac. X-Ray with degenerative changes. No red flag signs / symptoms. Some numbness / tingling in he arm.  - MRI neck - Refer to Ortho - Has used up Medicaid PT - Continue sulindac - follow up in 1-2 months.  - Continue heat / ice.   # HTN - has not picked up previous prescription. Continues on clonidine. No alarm symptoms at this time. Related  to morbid obesity.  - Encouraged compliance.  - Needs to get Rx - F/U with BP check nurse's visit in 2 weeks after on the medication.  - May need additional agents.  - last BMET 2/4 with normal Cr.  - Need aggressive control. She is 49.

## 2015-05-06 ENCOUNTER — Other Ambulatory Visit: Payer: Self-pay | Admitting: Family Medicine

## 2015-05-06 ENCOUNTER — Encounter: Payer: Self-pay | Admitting: Family Medicine

## 2015-05-06 ENCOUNTER — Telehealth: Payer: Self-pay | Admitting: *Deleted

## 2015-05-06 DIAGNOSIS — M542 Cervicalgia: Secondary | ICD-10-CM

## 2015-05-06 DIAGNOSIS — F40240 Claustrophobia: Secondary | ICD-10-CM

## 2015-05-06 MED ORDER — DIAZEPAM 2 MG PO TABS: 2.0000 mg | ORAL_TABLET | Freq: Once | ORAL | Status: DC

## 2015-05-06 NOTE — Telephone Encounter (Signed)
Will forward to MD. Jazmin Hartsell,CMA  

## 2015-05-06 NOTE — Telephone Encounter (Signed)
[facility] calls stating that with the diagnosis given, order for MRI order needs to be changed to MRI cervical spine without contrast before they can call and schedule patient. You may contact them at [phone] with any questions.

## 2015-05-06 NOTE — Telephone Encounter (Signed)
Changed order. Thanks  CGM MD

## 2015-05-06 NOTE — Addendum Note (Signed)
Addended by: Yolande Jolly on: 05/06/2015 01:44 PM   Modules accepted: Orders

## 2015-05-09 ENCOUNTER — Other Ambulatory Visit: Payer: Self-pay | Admitting: Family Medicine

## 2015-05-13 ENCOUNTER — Inpatient Hospital Stay: Admission: RE | Admit: 2015-05-13 | Payer: Self-pay | Source: Ambulatory Visit

## 2015-05-20 ENCOUNTER — Ambulatory Visit
Admission: RE | Admit: 2015-05-20 | Discharge: 2015-05-20 | Disposition: A | Discharge status: Home / Self Care | Payer: Medicaid Other | Source: Ambulatory Visit | Attending: Family Medicine | Admitting: Family Medicine

## 2015-05-20 DIAGNOSIS — M542 Cervicalgia: Secondary | ICD-10-CM

## 2015-06-03 ENCOUNTER — Telehealth: Payer: Self-pay | Admitting: Family Medicine

## 2015-06-03 NOTE — Telephone Encounter (Signed)
Called to discuss MRI results. Mild spinal stenosis C3-5, pain control and ice / heat for now. Follow up scheduled for Thursday.   CGM MD

## 2015-06-05 ENCOUNTER — Ambulatory Visit (INDEPENDENT_AMBULATORY_CARE_PROVIDER_SITE_OTHER): Payer: Medicaid Other | Admitting: Family Medicine

## 2015-06-05 ENCOUNTER — Encounter: Payer: Self-pay | Admitting: Family Medicine

## 2015-06-05 VITALS — BP 132/86 | HR 106 | Temp 98.9°F | Ht 64.0 in | Wt 349.2 lb

## 2015-06-05 DIAGNOSIS — G8929 Other chronic pain: Secondary | ICD-10-CM

## 2015-06-05 DIAGNOSIS — E0810 Diabetes mellitus due to underlying condition with ketoacidosis without coma: Secondary | ICD-10-CM | POA: Diagnosis not present

## 2015-06-05 DIAGNOSIS — Z7189 Other specified counseling: Secondary | ICD-10-CM

## 2015-06-05 LAB — POCT GLYCOSYLATED HEMOGLOBIN (HGB A1C): Hemoglobin A1C: 7.5

## 2015-06-05 MED ORDER — OXYCODONE HCL 5 MG PO TABS: 5.0000 mg | ORAL_TABLET | Freq: Three times a day (TID) | ORAL | Status: DC

## 2015-06-05 MED ORDER — OXYCODONE HCL 5 MG PO TABS: 5.0000 mg | ORAL_TABLET | Freq: Three times a day (TID) | ORAL | Status: DC | PRN

## 2015-06-05 NOTE — Patient Instructions (Signed)
Thanks for coming in today.   As you are aware, bariatric surgery is a good option for you and I am glad that you are investigating this further.   Continue the pain medicine and heat / tylenol as needed.   If you have any questions, or need anything just let us know.   Thanks for letting us take care of you.   Sincerely, Devota Pace, MD Family Medicine -PGY 2

## 2015-06-05 NOTE — Progress Notes (Signed)
Patient ID: [name], female   DOB: [birth year], 50 y.o.   MRN: [id number]   [facility] Yolande Jolly, MD Phone: [phone]  Subjective:   # Chronic Pain  - she is still having pain.  - has updated pain contract.  - Taking her prescribed amount of narcotics.  - No unusual requests or increased use.  - She HAS made significant improvement in that she is now currently being evaluated with bariatrics for surgery. This has been repeatedly advised for her to help her knee pain and back / neck pain.  - MRI reviewed of her neck. No signficant findings.  - Heat is still helping her neck.  - She denies side effects of the medicines.  - She takes miralax as needed for constipation.   All relevant systems were reviewed and were negative unless otherwise noted in the HPI  Past Medical History Reviewed problem list.  Medications- reviewed and updated Current Outpatient Prescriptions  Medication Sig Dispense Refill  . albuterol (PROAIR HFA) 108 (90 Base) MCG/ACT inhaler Inhale 2 puffs into the lungs every 4 (four) hours as needed. 8.5 Inhaler 2  . budesonide-formoterol (SYMBICORT) 160-4.5 MCG/ACT inhaler Inhale 2 puffs into the lungs 2 (two) times daily as needed (for shortness of breath). 1 Inhaler 11  . cetirizine (ZYRTEC) 10 MG tablet Take 1 tablet (10 mg total) by mouth daily. 30 tablet 11  . cloNIDine (CATAPRES) 0.1 MG tablet TAKE 1 TABLET (0.1 MG TOTAL) BY MOUTH 3 (THREE) TIMES DAILY. ONLY TO BE TAKEN IF PATCH FALLS OFF 30 tablet 1  . cloNIDine (CATAPRES-TTS-3) 0.3 mg/24hr patch PLACE 1 PATCH (0.3 MG TOTAL) ONTO THE SKIN ONCE A WEEK. 4 patch 8  . cyclobenzaprine (FLEXERIL) 10 MG tablet Take 1 tablet (10 mg total) by mouth 3 (three) times daily as needed for muscle spasms. 90 tablet 0  . diazepam (VALIUM) 2 MG tablet Take 1 tablet (2 mg total) by mouth once. For MRI Procedure 2 tablet 0  . dicyclomine (BENTYL) 20 MG tablet Take 1 tablet (20 mg total) by mouth  every 6 (six) hours as needed for spasms (abdominal pain). 120 tablet 5  . etodolac (LODINE) 400 MG tablet Take 400 mg by mouth 2 (two) times daily.    Marland Kitchen FLUoxetine (PROZAC) 40 MG capsule Take 1 capsule (40 mg total) by mouth daily. 30 capsule 11  . furosemide (LASIX) 20 MG tablet Take 2 tablets (40 mg total) by mouth 2 (two) times daily. 120 tablet 1  . lisinopril-hydrochlorothiazide (PRINZIDE,ZESTORETIC) 20-25 MG tablet Take 1 tablet by mouth daily. 90 tablet 3  . medroxyPROGESTERone (DEPO-PROVERA) 150 MG/ML injection Inject 150 mg into the muscle every 3 (three) months.     . meloxicam (MOBIC) 15 MG tablet Take 1 tablet (15 mg total) by mouth daily. 30 tablet 2  . metFORMIN (GLUCOPHAGE) 850 MG tablet Take 1 tablet (850 mg total) by mouth 2 (two) times daily with a meal. 180 tablet 1  . metoCLOPramide (REGLAN) 10 MG tablet Take 1 tablet (10 mg total) by mouth 3 (three) times daily before meals. 25 tablet 0  . montelukast (SINGULAIR) 10 MG tablet Take 1 tablet (10 mg total) by mouth at bedtime. 30 tablet 5  . omeprazole (PRILOSEC) 40 MG capsule TAKE 1 CAPSULE (40 MG TOTAL) BY MOUTH 2 (TWO) TIMES DAILY. 60 capsule 2  . ondansetron (ZOFRAN-ODT) 4 MG disintegrating tablet Take 1 tablet (4 mg total) by mouth every 8 (eight) hours as needed  for nausea or vomiting. 20 tablet 0  . oxybutynin (DITROPAN) 5 MG tablet Take 1 tablet (5 mg total) by mouth 2 (two) times daily. 60 tablet 3  . oxyCODONE (OXY IR/ROXICODONE) 5 MG immediate release tablet Take 1 tablet (5 mg total) by mouth every 8 (eight) hours as needed for severe pain. Do not fill until 30 days after this prescription date. 90 tablet 0  . polyethylene glycol (MIRALAX / GLYCOLAX) packet Take 17 g by mouth daily as needed for mild constipation.    . predniSONE (DELTASONE) 10 MG tablet Take two tablets with breaksfast for two days, then one tablet daily. 32 tablet 0  . promethazine (PHENERGAN) 12.5 MG tablet Take 1 tablet (12.5 mg total) by mouth  every 6 (six) hours as needed for nausea or vomiting. 30 tablet 0  . promethazine (PHENERGAN) 25 MG suppository Place 1 suppository (25 mg total) rectally every 6 (six) hours as needed for nausea or vomiting. 12 suppository 0  . sulindac (CLINORIL) 200 MG tablet Take 1 tablet (200 mg total) by mouth 2 (two) times daily as needed. 180 tablet 0   No current facility-administered medications for this visit.   Chief complaint-noted No additions to family history Social history- patient is a non smoker  Objective: BP 132/86 mmHg  Pulse 106  Temp(Src) 98.9 F (37.2 C) (Oral)  Ht 5\' 4"  (1.626 m)  Wt 349 lb 3.2 oz (158.396 kg)  BMI 59.91 kg/m2  SpO2 94% Gen: NAD, alert, cooperative with exam, morbidly obese HEENT: NCAT, EOMI, PERRL Neck: FROM, supple CV: RRR, good S1/S2, no murmur Resp: CTABL, no wheezes, non-labored Abd: SNTND, BS present, no guarding or organomegaly Ext: No edema, warm, normal tone, moves UE/LE spontaneously, walks with a cane. TTP with standing, knees without significant deformity, no instability.  Neuro: Alert and oriented, No gross deficits Skin: no rashes no lesions  Assessment/Plan:   # Chronic Pain - pain contract in system, has had uds this year, she is stable and using meds appropriately. Most importantly she is trying to do something about her weight by discussing / planning bariatric surgery.  - prescribed Rx for Oxycodone today.  - Will continue to follow along with decision / discussion re bariatric surgery.  - Ice / Heat as needed for pain.  - Tylenol as needed as well.

## 2015-06-05 NOTE — Assessment & Plan Note (Signed)
Has chronic pain. She is now being evaluated for bariatric surgery which I think may be a good thing for her. She continues to use her pain medicine appropriately. Pain is helped by Oxycodone 5mg  TID. Pain contract in place. UDS earlier this year.  - Continue Oxy IR 5mg  TID.  - Tylenol and heat prn as well.  - F/U in 3 months.

## 2015-06-17 ENCOUNTER — Other Ambulatory Visit: Payer: Self-pay | Admitting: *Deleted

## 2015-06-18 MED ORDER — METFORMIN HCL 850 MG PO TABS: 850.0000 mg | ORAL_TABLET | Freq: Two times a day (BID) | ORAL | Status: DC

## 2015-09-10 ENCOUNTER — Other Ambulatory Visit: Payer: Self-pay | Admitting: *Deleted

## 2015-09-10 DIAGNOSIS — I10 Essential (primary) hypertension: Secondary | ICD-10-CM

## 2015-09-11 MED ORDER — OMEPRAZOLE 40 MG PO CPDR
DELAYED_RELEASE_CAPSULE | ORAL | 2 refills | Status: DC
Start: 2015-09-11 — End: 2016-02-29

## 2015-09-11 MED ORDER — CLONIDINE HCL 0.1 MG PO TABS: ORAL_TABLET | ORAL | 1 refills | Status: DC

## 2015-09-11 MED ORDER — ALBUTEROL SULFATE HFA 108 (90 BASE) MCG/ACT IN AERS: 2.0000 | INHALATION_SPRAY | RESPIRATORY_TRACT | 2 refills | Status: DC | PRN

## 2015-09-11 MED ORDER — OXYBUTYNIN CHLORIDE 5 MG PO TABS: 5.0000 mg | ORAL_TABLET | Freq: Two times a day (BID) | ORAL | 3 refills | Status: DC

## 2015-09-11 NOTE — Telephone Encounter (Signed)
Rx filled.  [name]. [name], MD [facility] Family Medicine Resident PGY-3 09/11/2015 7:37 AM

## 2015-09-23 ENCOUNTER — Ambulatory Visit: Payer: Self-pay | Admitting: Family Medicine

## 2015-11-05 ENCOUNTER — Telehealth: Payer: Self-pay | Admitting: *Deleted

## 2015-11-05 NOTE — Telephone Encounter (Signed)
From completed and given to Tamika.  Katina Degree. [name], MD [facility] Family Medicine Resident PGY-3 11/05/2015 1:44 PM

## 2015-11-05 NOTE — Telephone Encounter (Signed)
Received PA approval for Oxycodone 5 mg via [place] [employer].  Med approved for 11/05/2015 - 05/03/2016.  [facility] pharmacy informed.  PA approval number [id number]. Clovis Pu, RN

## 2015-11-05 NOTE — Telephone Encounter (Signed)
Prior Authorization received from [facility] for Oxycodone 5 mg. Formulary and PA form placed in provider box for completion. Clovis Pu, RN

## 2015-11-27 ENCOUNTER — Encounter: Payer: Self-pay | Admitting: Family Medicine

## 2015-11-27 ENCOUNTER — Ambulatory Visit (INDEPENDENT_AMBULATORY_CARE_PROVIDER_SITE_OTHER): Payer: Medicaid Other | Admitting: Family Medicine

## 2015-11-27 VITALS — BP 122/62 | HR 82 | Temp 98.3°F | Wt 353.0 lb

## 2015-11-27 DIAGNOSIS — J45909 Unspecified asthma, uncomplicated: Secondary | ICD-10-CM

## 2015-11-27 DIAGNOSIS — Z23 Encounter for immunization: Secondary | ICD-10-CM | POA: Diagnosis not present

## 2015-11-27 DIAGNOSIS — G8929 Other chronic pain: Secondary | ICD-10-CM | POA: Diagnosis not present

## 2015-11-27 DIAGNOSIS — E0810 Diabetes mellitus due to underlying condition with ketoacidosis without coma: Secondary | ICD-10-CM | POA: Diagnosis not present

## 2015-11-27 DIAGNOSIS — I10 Essential (primary) hypertension: Secondary | ICD-10-CM | POA: Diagnosis not present

## 2015-11-27 LAB — CBC
HEMATOCRIT: 39.4 % (ref 35.0–45.0)
Hemoglobin: 12.4 g/dL (ref 11.7–15.5)
MCH: 26.5 pg — ABNORMAL LOW (ref 27.0–33.0)
MCHC: 31.5 g/dL — ABNORMAL LOW (ref 32.0–36.0)
MCV: 84.2 fL (ref 80.0–100.0)
MPV: 9.7 fL (ref 7.5–12.5)
PLATELETS: 319 10*3/uL (ref 140–400)
RBC: 4.68 MIL/uL (ref 3.80–5.10)
RDW: 15.4 % — ABNORMAL HIGH (ref 11.0–15.0)
WBC: 13.5 10*3/uL — AB (ref 3.8–10.8)

## 2015-11-27 LAB — POCT GLYCOSYLATED HEMOGLOBIN (HGB A1C): Hemoglobin A1C: 7.7

## 2015-11-27 MED ORDER — FLUTICASONE PROPIONATE 50 MCG/ACT NA SUSP: 2.0000 | Freq: Every day | NASAL | 6 refills | Status: DC

## 2015-11-27 MED ORDER — OXYCODONE HCL 5 MG PO TABS: 5.0000 mg | ORAL_TABLET | Freq: Three times a day (TID) | ORAL | 0 refills | Status: DC

## 2015-11-27 MED ORDER — BUDESONIDE-FORMOTEROL FUMARATE 160-4.5 MCG/ACT IN AERO: 2.0000 | INHALATION_SPRAY | Freq: Two times a day (BID) | RESPIRATORY_TRACT | 11 refills | Status: DC | PRN

## 2015-11-27 MED ORDER — OXYCODONE HCL 5 MG PO TABS: 5.0000 mg | ORAL_TABLET | Freq: Three times a day (TID) | ORAL | 0 refills | Status: DC | PRN

## 2015-11-27 MED ORDER — SITAGLIPTIN PHOSPHATE 100 MG PO TABS: 100.0000 mg | ORAL_TABLET | Freq: Every day | ORAL | 3 refills | Status: DC

## 2015-11-27 NOTE — Patient Instructions (Addendum)
Please restart the symbicort for your breathing. You can try using the flonase for your cold.  Schedule an appointment with Dr Raymondo Band for PFTs to test your lungs.  Continue your blood pressure mediations.  We will start [place] today for your blood sugar. Continue taking metformin.  We will refill your pain medication today.   We will check blood work today.  Please come back soon for your pap smear.  Come back to see me in 3 months to recheck your blood sugar, or sooner if you need anything else.  Take care,  Dr [name]

## 2015-11-27 NOTE — Progress Notes (Signed)
   Subjective:  [name] is a 50 y.o. female who presents to the [facility] today with a chief complaint of shortness of breath.   HPI:  Shortness of Breath Patient with a history of asthma. Says that she has noticed worsening symptoms of dyspnea over the past [redacted] weeks along with subjective fevers or chills. Patient is not sure what exactly exacerbated the symptoms, though thinks that it may be due to recent changes of seasons. She has been using albuterol which helps but does not last long. She has not been using symbicort.   Sinus Congestion Patient has also had sinus congestion over the past couple of weeks. Has tried using OTC medications which has not helped. No facial pain. No fevers or chills.   Hypertension BP Readings from Last 3 Encounters:  11/27/15 122/62  06/05/15 132/86  04/29/15 (!) 176/104   Home BP monitoring-Yes Compliant with medications-yes, without side effects ROS-Denies any CP, HA, blurry vision, LE edema, transient weakness, orthopnea, PND.   T2DM Patient currently tolerating metformin without side effects. No polyuria or polydipsia. Currently trying to lose weight to qualify for bariatric surgery.   Chronic Knee Pain Longstanding problem for patient. Has been taking oxycodone 6mg  IR which helps with the pain and improves her ability to function. She notices a significant difference when not taking medications. She has tried knee injections in the past and is not interested in having more. She is currently working on weight loss.   ROS: Per HPI  PMH: Smoking history reviewed.   Objective:  Physical Exam: BP 122/62   Pulse 82   Temp 98.3 F (36.8 C) (Oral)   Wt (!) 353 lb (160.1 kg)   SpO2 100%   BMI 60.59 kg/m   Gen: NAD, resting comfortably, cooperative, morbidly obese HEENT: TMs clear. OP clear.  CV: RRR with no murmurs appreciated Pulm: NWOB, CTAB with no crackles, wheezes, or rhonchi GI: Obese, Normal bowel sounds present. Soft, Nontender,  Nondistended. MSK: Knees without significant deformity. TTP along joint lines. FROM.  Skin: warm, dry Neuro: grossly normal, moves all extremities Psych: Normal affect and thought content  Results for orders placed or performed in visit on 11/27/15 (from the past 72 hour(s))  HgB A1c     Status: None   Collection Time: 11/27/15 10:20 AM  Result Value Ref Range   Hemoglobin A1C 7.7    Assessment/Plan:  Asthma Worsening symptoms today likely due to medication noncompliance. Will restart symbicort. Continue albuterol as needed. Asked patient to follow up for PFTs soon. Follow up within the next 1-3 months after PFTs.   HYPERTENSION, BENIGN SYSTEMIC At goal today. Continue clonidine and lisinopril-HCTZ. Follow up in 3 months. Check BMP today.   Diabetes (HCC) A1c above goal. Will start 13/02/17 100mg  daily. Continue metformin 850mg  BID. Follow up in 3 months.   Encounter for chronic pain management Refilled oxycodone today which seems to be improving her symptoms and function. No red flag signs or symptoms. Is actively trying to lose weight which will help with the pain. Follow up in 3 months. Will need repeat UDS.   Viral URI Sinus congestion and cough likely related to viral URI. Reassurance given. No signs of bacterial infection. Will treat symptomatically with flonase nasal spray. Return precautions reviewed. Follow up as needed.   [place]. , MD [facility] Family Medicine Resident PGY-3 11/27/2015 2:11 PM

## 2015-11-27 NOTE — Assessment & Plan Note (Signed)
Worsening symptoms today likely due to medication noncompliance. Will restart symbicort. Continue albuterol as needed. Asked patient to follow up for PFTs soon. Follow up within the next 1-3 months after PFTs.

## 2015-11-27 NOTE — Assessment & Plan Note (Addendum)
At goal today. Continue clonidine and lisinopril-HCTZ. Follow up in 3 months. Check BMP today.

## 2015-11-27 NOTE — Assessment & Plan Note (Signed)
A1c above goal. Will start januvia 100mg  daily. Continue metformin 850mg  BID. Follow up in 3 months.

## 2015-11-27 NOTE — Assessment & Plan Note (Signed)
Refilled oxycodone today which seems to be improving her symptoms and function. No red flag signs or symptoms. Is actively trying to lose weight which will help with the pain. Follow up in 3 months. Will need repeat UDS.

## 2015-11-28 ENCOUNTER — Encounter: Payer: Self-pay | Admitting: Family Medicine

## 2015-11-28 ENCOUNTER — Other Ambulatory Visit: Payer: Self-pay | Admitting: Family Medicine

## 2015-11-28 LAB — BASIC METABOLIC PANEL WITH GFR
BUN: 11 mg/dL (ref 7–25)
CHLORIDE: 102 mmol/L (ref 98–110)
CO2: 27 mmol/L (ref 20–31)
Calcium: 9.6 mg/dL (ref 8.6–10.2)
Creat: 0.75 mg/dL (ref 0.50–1.10)
Glucose, Bld: 142 mg/dL — ABNORMAL HIGH (ref 65–99)
POTASSIUM: 4.2 mmol/L (ref 3.5–5.3)
Sodium: 140 mmol/L (ref 135–146)

## 2015-11-28 LAB — LIPID PANEL
CHOL/HDL RATIO: 4.5 ratio (ref <=5.0)
CHOLESTEROL: 178 mg/dL (ref 125–200)
HDL: 40 mg/dL — AB (ref >=46)
LDL Cholesterol: 111 mg/dL (ref <130)
Triglycerides: 133 mg/dL (ref <150)
VLDL: 27 mg/dL (ref <30)

## 2015-11-28 MED ORDER — ETODOLAC 400 MG PO TABS: 400.0000 mg | ORAL_TABLET | Freq: Two times a day (BID) | ORAL | 1 refills | Status: DC

## 2015-11-28 MED ORDER — CETIRIZINE HCL 10 MG PO TABS: 10.0000 mg | ORAL_TABLET | Freq: Every day | ORAL | 11 refills | Status: DC

## 2015-12-09 ENCOUNTER — Ambulatory Visit: Payer: Self-pay | Admitting: Pharmacist

## 2015-12-09 ENCOUNTER — Other Ambulatory Visit: Payer: Self-pay | Admitting: *Deleted

## 2015-12-09 MED ORDER — MONTELUKAST SODIUM 10 MG PO TABS: 10.0000 mg | ORAL_TABLET | Freq: Every day | ORAL | 11 refills | Status: DC

## 2015-12-11 ENCOUNTER — Ambulatory Visit (INDEPENDENT_AMBULATORY_CARE_PROVIDER_SITE_OTHER): Payer: Medicaid Other | Admitting: Pharmacist

## 2015-12-11 ENCOUNTER — Encounter: Payer: Self-pay | Admitting: Pharmacist

## 2015-12-11 DIAGNOSIS — J45909 Unspecified asthma, uncomplicated: Secondary | ICD-10-CM

## 2015-12-11 MED ORDER — BUDESONIDE-FORMOTEROL FUMARATE 160-4.5 MCG/ACT IN AERO: 2.0000 | INHALATION_SPRAY | Freq: Two times a day (BID) | RESPIRATORY_TRACT | 11 refills | Status: DC

## 2015-12-11 NOTE — Assessment & Plan Note (Signed)
Spirometry evaluation with Pre and Post Bronchodilator reveals normal lung function with last dose of Symbicort last night.  Patient has been experiencing SOB for 2-3x/day over the past few months and taking Symbicort 2 puffs once daily and albuterol PRN. Will increase Symbicort 160/4.5 mcg to 2 puffs twice daily and continue albuterol as needed.  Educated patient on purpose, proper use, potential adverse effects including  risk of esophageal candidiasis and need to rinse mouth after each use.  Reviewed results of pulmonary function tests. Patient verbalized understanding of results and education.  Written pt instructions provided.  F/U Clinic visit with Dr [name] in 3 months.

## 2015-12-11 NOTE — Patient Instructions (Addendum)
Thank you for coming in today.   Continue Symbicort 2 puffs twice daily and albuterol as needed.   You can try saline nasal spray to help with the post nasal drip   Followup with Dr [name] in 3 months.

## 2015-12-11 NOTE — Progress Notes (Signed)
   S:    Patient arrives in good spirits ambulating with the assistance of a walker.    Presents for lung function evaluation.  Patient was referred on 11/27/15.  Patient was last seen by Dr [name] on 11/27/15.  Patient reports breathing has been worse recently because of having multiple cold/respiratory viruses from kids coming in and out of her house. Using albuterol 2-3x/day recently over past couple months for shortness of breath.  Some days she reports needing the albuterol as much as every 2 hours. Denies history of smoking. She is taking Symbicort 2 puffs once daily. She has been on prednisone 10 mg daily x last few years.   Patient reports last dose of asthma medications was albuterol (830 am 11/16) and symbicort (last night 11/15).   O: See "scanned report" or Documentation Flowsheet (discrete results - PFTs) for  Spirometry results. Patient provided good effort while attempting spirometry.   Albuterol Neb  0.083% Lot# [id number]     Exp. Feb 2019  A/P: Spirometry evaluation with Pre and Post Bronchodilator reveals normal lung function with last dose of Symbicort last night.  Patient has been experiencing SOB for 2-3x/day over the past few months and taking Symbicort 2 puffs once daily and albuterol PRN. Will increase Symbicort 160/4.5 mcg to 2 puffs twice daily and continue albuterol as needed.  Educated patient on purpose, proper use, potential adverse effects including  risk of esophageal candidiasis and need to rinse mouth after each use.  Continue low dose oral steroids at this time.  Reviewed results of pulmonary function tests. Patient verbalized understanding of results and education.  Written pt instructions provided.  F/U Clinic visit with Dr [name] in 3 months.   Total time in face to face counseling 45 minutes.  Patient seen with Alphonzo Severance, PharmD Candidate,  Rosalita Chessman, PharmD PGY1 Resident and Hazle Nordmann, PharmD PGY2 Resident  .

## 2015-12-12 NOTE — Progress Notes (Signed)
Patient ID: [name], female   DOB: November 16, 1965, 50 y.o.   MRN: [id number] Reviewed: Agree with Dr. Macky Lower documentation and management.

## 2016-01-06 ENCOUNTER — Other Ambulatory Visit: Payer: Self-pay | Admitting: Family Medicine

## 2016-01-06 DIAGNOSIS — I10 Essential (primary) hypertension: Secondary | ICD-10-CM

## 2016-02-03 ENCOUNTER — Other Ambulatory Visit: Payer: Self-pay | Admitting: *Deleted

## 2016-02-03 DIAGNOSIS — I10 Essential (primary) hypertension: Secondary | ICD-10-CM

## 2016-02-04 MED ORDER — LISINOPRIL-HYDROCHLOROTHIAZIDE 20-25 MG PO TABS: 1.0000 | ORAL_TABLET | Freq: Every day | ORAL | 3 refills | Status: DC

## 2016-02-06 ENCOUNTER — Other Ambulatory Visit: Payer: Self-pay | Admitting: Family Medicine

## 2016-02-29 ENCOUNTER — Other Ambulatory Visit: Payer: Self-pay | Admitting: Family Medicine

## 2016-04-09 ENCOUNTER — Other Ambulatory Visit: Payer: Self-pay | Admitting: *Deleted

## 2016-04-09 MED ORDER — METFORMIN HCL 850 MG PO TABS: 850.0000 mg | ORAL_TABLET | Freq: Two times a day (BID) | ORAL | 1 refills | Status: DC

## 2016-04-19 ENCOUNTER — Other Ambulatory Visit: Payer: Self-pay | Admitting: *Deleted

## 2016-04-19 ENCOUNTER — Encounter: Payer: Self-pay | Admitting: *Deleted

## 2016-04-19 MED ORDER — FLUOXETINE HCL 40 MG PO CAPS
40.0000 mg | ORAL_CAPSULE | Freq: Every day | ORAL | 11 refills | Status: DC
Start: 2016-04-19 — End: 2017-05-23

## 2016-04-19 NOTE — Telephone Encounter (Signed)
Prozac filled. I am not sure why she is on the prednisone. It does not look like this medication has been filled by this office before. She needs an office appointment to discuss further, or she should contact the office that originally prescribed it for her.  [name]. [name], MD [facility] Family Medicine Resident PGY-3 04/19/2016 2:22 PM

## 2016-04-20 ENCOUNTER — Other Ambulatory Visit: Payer: Self-pay | Admitting: *Deleted

## 2016-04-20 MED ORDER — CLONIDINE HCL 0.3 MG/24HR TD PTWK: MEDICATED_PATCH | TRANSDERMAL | 8 refills | Status: DC

## 2016-05-03 ENCOUNTER — Telehealth: Payer: Self-pay | Admitting: *Deleted

## 2016-05-03 NOTE — Telephone Encounter (Signed)
[facility] Author[facility] received [facility] for Etodolac. Formulary and PA form placed in provider box for completion. Clovis Pu, RN

## 2016-05-04 MED ORDER — NAPROXEN 500 MG PO TABS: 500.0000 mg | ORAL_TABLET | Freq: Two times a day (BID) | ORAL | 1 refills | Status: DC

## 2016-05-04 NOTE — Telephone Encounter (Signed)
Formulary alternative sent in.  Katina Degree. [name], MD [facility] Family Medicine Resident PGY-3 05/04/2016 12:02 PM

## 2016-05-07 ENCOUNTER — Telehealth: Payer: Self-pay | Admitting: *Deleted

## 2016-05-07 NOTE — Telephone Encounter (Signed)
[facility]ax received from [facility] regarding statin therapy for this patient. Fax placed in provider's box for consideration. Kinnie Feil, RN, BSN

## 2016-07-15 ENCOUNTER — Other Ambulatory Visit: Payer: Self-pay | Admitting: *Deleted

## 2016-07-15 MED ORDER — NAPROXEN 500 MG PO TABS: 500.0000 mg | ORAL_TABLET | Freq: Two times a day (BID) | ORAL | 1 refills | Status: DC

## 2016-07-15 MED ORDER — ALBUTEROL SULFATE HFA 108 (90 BASE) MCG/ACT IN AERS: 2.0000 | INHALATION_SPRAY | RESPIRATORY_TRACT | 2 refills | Status: DC | PRN

## 2016-07-20 ENCOUNTER — Other Ambulatory Visit: Payer: Self-pay | Admitting: *Deleted

## 2016-07-20 ENCOUNTER — Encounter: Payer: Self-pay | Admitting: *Deleted

## 2016-07-20 NOTE — Telephone Encounter (Signed)
Prednisone not filled. Patient requested this back in March and was denied because we did not see a reason for her to be on this. She has not followed up since then.  [name]. [name], MD [facility] Family Medicine Resident PGY-3 07/20/2016 11:09 AM

## 2016-07-20 NOTE — Telephone Encounter (Signed)
Mychart message sent to patient. [name],CMA  

## 2016-07-21 ENCOUNTER — Ambulatory Visit (INDEPENDENT_AMBULATORY_CARE_PROVIDER_SITE_OTHER): Payer: Medicaid Other | Admitting: Obstetrics and Gynecology

## 2016-07-21 ENCOUNTER — Telehealth: Payer: Self-pay | Admitting: *Deleted

## 2016-07-21 ENCOUNTER — Other Ambulatory Visit: Payer: Self-pay | Admitting: Family Medicine

## 2016-07-21 DIAGNOSIS — G8929 Other chronic pain: Secondary | ICD-10-CM

## 2016-07-21 MED ORDER — OXYCODONE HCL 5 MG PO TABS: 5.0000 mg | ORAL_TABLET | Freq: Three times a day (TID) | ORAL | 0 refills | Status: DC

## 2016-07-21 MED ORDER — SULINDAC 200 MG PO TABS: 200.0000 mg | ORAL_TABLET | Freq: Two times a day (BID) | ORAL | 0 refills | Status: DC | PRN

## 2016-07-21 NOTE — Progress Notes (Signed)
Patient was on schedule to see Dr Doroteo Glassman today, however arrived late. She needs refill on her pain mediations.  Gave her a 10 day supply while in the office and asked her to follow up with her new PCP. She will need a pain contract, UDS, etc.   Shriyans Kuenzi M. [name], MD [facility] Family Medicine Resident PGY-3 07/21/2016 11:32 AM

## 2016-07-21 NOTE — Telephone Encounter (Signed)
[facility] for [facility]one HCL. Office notes are needed to complete the prior authorization.  Part of the PA has been completed online, but office notes are needed to be faxed to [facility]. Clovis Pu, RN

## 2016-07-22 NOTE — Progress Notes (Signed)
erroneous encounter. patient arrived to appointment >76min late and decided to not be seen

## 2016-07-23 ENCOUNTER — Encounter: Payer: Self-pay | Admitting: *Deleted

## 2016-07-23 NOTE — Telephone Encounter (Signed)
Patient has not been seen in over 6 months for chronic pain. She was scheduled to see Dr Doroteo Glassman this week, but arrived late to her appointment. I gave her a short supply to last her until her next appointment.  She will need an office appointment to meet the requirements for a refill.  [name]. [name], MD [facility] Resident PGY-3 07/23/2016 9:44 AM

## 2016-07-23 NOTE — Telephone Encounter (Signed)
Mychart message sent to patient. [name],CMA  

## 2016-07-23 NOTE — Telephone Encounter (Signed)
Received another PA request for oxycodone. Please advise.  Clovis Pu, RN

## 2016-08-01 NOTE — Progress Notes (Signed)
[name] Phone: [phone]   Date of Visit: 08/02/2016   HPI:  Chronic OA of the Knees: - x-ray 07/2013 with progressive tricompartmental degenerative changes of the right nee with profressive loss of height within the medial compartment - x-ray 07/2013: Left Knee: Advanced tricompartmental osteoarthritic changes with joint space narrowing and spur formation greatest at the medial compartment - patient reports of history of knee injections which initially helped but no longer helped. She also tried "other type of injections" bu orthopedist which made symptoms worse - she has bilateral knee pain that worsens with ambulation. Reports that it is difficult to ambulate from the house to her car without pain medication.  - denies lower extremity weakness, numbness, urinary incontinence/retention, or bowel incontinence  Shortness of Breath:   - patient has a history of Asthma and history of sarcoidosis. No smoking history. She has not seen a pulmonologist in years. Per chart review, I cannot find a visit note from pulm.  - She reports she has had shortness of breath since she stopped taking daily Prednisone 10mg  about 1 month ago. Has a cough but nonproductive at night. Has wheezing. No fevers or chills.  - reports that she has been on chronic daily prednisone for a while. This medication has been prescribed to her from [facility] per patient. She has never seen a lung specialist. Per chart review, his prior PCP declined the prednisone refills as per note he did not see why she would still need to be on this medication.  - she has been needing her albuterol daily  - no lower extremity swelling, she has 4-5 pillow orthopnea which is chronic and stable.  - taking Symbicort and Singulair  - per chart review PFTs from 11/2007 were normal.   ROS: See HPI.  PMFSH:  PMH: HTN Hemorrhoids: internal and external Allergic Rhinitis Asthma OSA GERD DM2 Depression Chronic  Pain Morbid Obesity Obesity Hypoventilation Syndrome Sarcoidosis Myasthenia Gravis Mixed Incontinence    PHYSICAL EXAM: BP 132/74   Pulse 97   Temp 99.1 F (37.3 C) (Oral)   Wt (!) 350 lb (158.8 kg)   SpO2 98%   BMI 60.08 kg/m  GEN: NAD, able to speak in full sentences, CV: RRR, no murmurs, rubs, or gallops PULM: normal effort, but expiratory wheezing throughout, no crackles, good air movement  SKIN: No rash or cyanosis; warm and well-perfused EXTR: No lower extremity edema or calf tenderness MSK: Right Knee: no effusion, decreased ROM due to pain (flexion and extension), TTP of the medial joint lines, ligaments are intact. Normal sensation to light touch.  Left Knee: no effusion, decreased ROM due to pain (flexion and extension) , TTP of the medial joint line, ligaments are intact. Normal sensation to light touch  PSYCH: Mood and affect euthymic, normal rate and volume of speech NEURO: Awake, alert, no focal deficits grossly, normal speech   ASSESSMENT/PLAN:   Encounter for chronic pain management Reviewed narcotic database. She did have opioid medication filled by dentist as she had procedures done. Otherwise it is appropriate. Pain medication allows patient to be more functional with her OA. Refilled Oxy for 30 days (#90 tab). Recommended follow up with PCP for refills. Would do UDS at next visit and renew pain contract.   Asthma With her symptoms of wheezing and nightime cough, will treat as asthma exacerbation. Her sarcoidosis may also be contributing to her respiratory symptoms. After discussing with preceptor, will prescribe Prednisone 40mg  daily for 10 days for flare.  Unlikely CHF or infection. Return precautions discussed. Made referral to Pulmonology for further evaluation to see if she would benefit from chronic steroids if this is due to sarcoidosis.     Palma Holter, MD PGY 2 [facility]

## 2016-08-02 ENCOUNTER — Encounter: Payer: Self-pay | Admitting: Internal Medicine

## 2016-08-02 ENCOUNTER — Ambulatory Visit (INDEPENDENT_AMBULATORY_CARE_PROVIDER_SITE_OTHER): Payer: Medicaid Other | Admitting: Internal Medicine

## 2016-08-02 VITALS — BP 132/74 | HR 97 | Temp 99.1°F | Wt 350.0 lb

## 2016-08-02 DIAGNOSIS — J45909 Unspecified asthma, uncomplicated: Secondary | ICD-10-CM | POA: Diagnosis not present

## 2016-08-02 DIAGNOSIS — G8929 Other chronic pain: Secondary | ICD-10-CM | POA: Diagnosis not present

## 2016-08-02 DIAGNOSIS — E0810 Diabetes mellitus due to underlying condition with ketoacidosis without coma: Secondary | ICD-10-CM

## 2016-08-02 DIAGNOSIS — J45901 Unspecified asthma with (acute) exacerbation: Secondary | ICD-10-CM

## 2016-08-02 LAB — POCT GLYCOSYLATED HEMOGLOBIN (HGB A1C): HEMOGLOBIN A1C: 8.3

## 2016-08-02 MED ORDER — PREDNISONE 20 MG PO TABS: 40.0000 mg | ORAL_TABLET | Freq: Every day | ORAL | 0 refills | Status: DC

## 2016-08-02 NOTE — Patient Instructions (Addendum)
Please keep your appointment with Dr. Mosetta Putt We made a referral to pulmonology.

## 2016-08-03 ENCOUNTER — Telehealth: Payer: Self-pay | Admitting: Student in an Organized Health Care Education/Training Program

## 2016-08-03 DIAGNOSIS — G8929 Other chronic pain: Secondary | ICD-10-CM

## 2016-08-03 MED ORDER — OXYCODONE HCL 5 MG PO TABS: 5.0000 mg | ORAL_TABLET | Freq: Three times a day (TID) | ORAL | 0 refills | Status: DC | PRN

## 2016-08-03 NOTE — Telephone Encounter (Signed)
Rx is printed. I called the pharmacy and asked to discard the prior Rx written by Dr. [name]. Please call and inform patient that Rx and ready for pick up.

## 2016-08-03 NOTE — Assessment & Plan Note (Addendum)
Reviewed narcotic database. She did have opioid medication filled by dentist as she had procedures done. Otherwise it is appropriate. Pain medication allows patient to be more functional with her OA. Refilled Oxy for 30 days (#90 tab). Recommended follow up with PCP for refills. Would do UDS at next visit and renew pain contract.

## 2016-08-03 NOTE — Telephone Encounter (Signed)
Pharmacy is calling because the prescription written for the patients oxycodone states do not fill for 60 days of the date which is 07/21/16. They are not sure what to do since the patient said that this is incorrect. The prescription was written by Dr. [name]. Can we check into this and call the pharmacy to authorize. jw

## 2016-08-03 NOTE — Assessment & Plan Note (Signed)
With her symptoms of wheezing and nightime cough, will treat as asthma exacerbation. Her sarcoidosis may also be contributing to her respiratory symptoms. After discussing with preceptor, will prescribe Prednisone 40mg  daily for 10 days for flare. Unlikely CHF or infection. Return precautions discussed. Made referral to Pulmonology for further evaluation to see if she would benefit from chronic steroids if this is due to sarcoidosis.

## 2016-08-03 NOTE — Telephone Encounter (Signed)
Patient is aware. [name],CMA  

## 2016-08-03 NOTE — Telephone Encounter (Signed)
Will forward to MD who saw patient yesterday to please write a new script to reflect ok to refill today.  Will ask pharmacy to discard the other script. Levoy Geisen,CMA

## 2016-08-04 ENCOUNTER — Institutional Professional Consult (permissible substitution): Payer: Self-pay | Admitting: Pulmonary Disease

## 2016-08-04 ENCOUNTER — Telehealth: Payer: Self-pay | Admitting: *Deleted

## 2016-08-04 NOTE — Telephone Encounter (Signed)
PA is pending for oxycodone per [place] [facility].   Clovis Pu, RN

## 2016-08-04 NOTE — Progress Notes (Deleted)
Subjective:    Patient ID: [name], female    DOB: 1965/08/02, 51 y.o.   MRN: [id number]  HPI   Review of Systems A pertinent 14 point review of systems is negative except as per the history of presenting illness.  Allergies  Allergen Reactions  . Apple Other (See Comments)    "mouth itch" - lumps on tongue  . Banana Other (See Comments)    "whelps on tongue"  . Shrimp [Shellfish Allergy] Nausea And Vomiting and Swelling    Current Outpatient Prescriptions on File Prior to Visit  Medication Sig Dispense Refill  . albuterol (PROAIR HFA) 108 (90 Base) MCG/ACT inhaler Inhale 2 puffs into the lungs every 4 (four) hours as needed. 8.5 Inhaler 2  . budesonide-formoterol (SYMBICORT) 160-4.5 MCG/ACT inhaler Inhale 2 puffs into the lungs 2 (two) times daily. 1 Inhaler 11  . cetirizine (ZYRTEC) 10 MG tablet Take 1 tablet (10 mg total) by mouth daily. 30 tablet 11  . cloNIDine (CATAPRES) 0.1 MG tablet TAKE 1 TABLET (0.1 MG TOTAL) BY MOUTH 3 (THREE) TIMES DAILY. ONLY TO BE TAKEN IF PATCH FALLS OFF (Patient not taking: Reported on 08/02/2016) 90 tablet 11  . cloNIDine (CATAPRES-TTS-3) 0.3 mg/24hr patch PLACE 1 PATCH (0.3 MG TOTAL) ONTO THE SKIN ONCE A WEEK. 4 patch 8  . dicyclomine (BENTYL) 20 MG tablet Take 1 tablet (20 mg total) by mouth every 6 (six) hours as needed for spasms (abdominal pain). (Patient not taking: Reported on 08/02/2016) 120 tablet 5  . FLUoxetine (PROZAC) 40 MG capsule Take 1 capsule (40 mg total) by mouth daily. 30 capsule 11  . fluticasone (FLONASE) 50 MCG/ACT nasal spray Place 2 sprays into both nostrils daily. 16 g 6  . furosemide (LASIX) 20 MG tablet Take 20 mg by mouth daily as needed.    Marland Kitchen lisinopril-hydrochlorothiazide (PRINZIDE,ZESTORETIC) 20-25 MG tablet Take 1 tablet by mouth daily. 90 tablet 3  . medroxyPROGESTERone (DEPO-PROVERA) 150 MG/ML injection Inject 150 mg into the muscle every 3 (three) months.     . metFORMIN (GLUCOPHAGE) 850 MG tablet Take 1  tablet (850 mg total) by mouth 2 (two) times daily with a meal. 180 tablet 1  . montelukast (SINGULAIR) 10 MG tablet Take 1 tablet (10 mg total) by mouth at bedtime. 30 tablet 11  . naproxen (NAPROSYN) 500 MG tablet Take 1 tablet (500 mg total) by mouth 2 (two) times daily with a meal. (Patient not taking: Reported on 08/02/2016) 30 tablet 1  . omeprazole (PRILOSEC) 40 MG capsule TAKE 1 CAPSULE (40 MG TOTAL) BY MOUTH 2 (TWO) TIMES DAILY. 60 capsule 2  . oxybutynin (DITROPAN) 5 MG tablet TAKE 1 TABLET (5 MG TOTAL) BY MOUTH 2 (TWO) TIMES DAILY. 60 tablet 3  . oxyCODONE (OXY IR/ROXICODONE) 5 MG immediate release tablet Take 1 tablet (5 mg total) by mouth every 8 (eight) hours as needed for severe pain. 90 tablet 0  . polyethylene glycol (MIRALAX / GLYCOLAX) packet Take 17 g by mouth daily as needed for mild constipation.    . predniSONE (DELTASONE) 20 MG tablet Take 2 tablets (40 mg total) by mouth daily with breakfast. 20 tablet 0  . sitaGLIPtin (JANUVIA) 100 MG tablet Take 1 tablet (100 mg total) by mouth daily. 90 tablet 3  . sulindac (CLINORIL) 200 MG tablet Take 1 tablet (200 mg total) by mouth 2 (two) times daily as needed. 180 tablet 0   No current facility-administered medications on file prior to visit.  Past Medical History:  Diagnosis Date  . ALLERGIC RHINITIS    takes Zyrtec daily  . Anxiety   . Arthritis   . Asthma    Albuterol prn;Symbicort daily and Singulair daily  . Bronchitis    hx of  . CAP (community acquired pneumonia) 02/20/2015  . Carpal tunnel syndrome    right  . Chronic back pain   . Constipation    Miralax prn  . Depression    takes Cymbalta daily  . Diabetes (HCC)   . Dizziness    pt states she gets off balance occasionally  . Eczema   . Gallstones 2010  . GERD (gastroesophageal reflux disease)    takes Omeprazole daily  . Headache(784.0)    couple of times a week  . Hemorrhoids   . Hypertension    takes Prinizide daily and Clonidine on Mondays  .  Hypoventilation associated with obesity syndrome (HCC)   . Insomnia   . Irritable bladder   . Joint pain   . Joint swelling   . Lung disease    scleraderma  . Myasthenia gravis 1994   Positive Acetylcholine receptor Ab and single fiber EMG Dr. Clarisa [facility]- Dr. Noreene Filbert 1998, Dr Sharene Skeans 2008 [facility]- Failed prednisone due to weight gain, stopped imuran/mestinon due to finances- Now with quiescent disease per Dr Sharene Skeans and no flares in many years  . Myasthenia gravis   . Nocturia   . OSA (obstructive sleep apnea)    RDI 10 on PSG 10/09  . Osteoarthritis   . Pelvic inflammatory disease (PID)   . Peptic ulcer   . Pneumonia    hx of;last time about 1-21yrs ago  . 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 Coralyn Helling- 10/30/2007 ANA positive, ANA titer  negative, RF less than 20  . Rheumatoid arthritis(714.0)   . Shortness of breath    can be sitting as well as exertion  . Skin irritation    skin itchy  . Trigger finger   . Urinary frequency    takes Ditropan daily  . Urinary urgency   . Viral meningitis    history of viral meningitis    Past Surgical History:  Procedure Laterality Date  . BRONCHOSCOPY  08/2007  . CARPAL TUNNEL RELEASE  05/20/2011   Procedure: CARPAL TUNNEL RELEASE;  Surgeon: [name], MD;  Location[facility];  Service: Orthopedics;  Laterality: Right;  . CESAREAN SECTION  09/15/2004  . COLONOSCOPY N/A 01/21/2014   Procedure: COLONOSCOPY;  Surgeon: Iva Boop, MD;  Location: [facility] ENDOSCOPY;  Service: Endoscopy;  Laterality: N/A;  . cortisone injection     receives an injection every 57months  . ESOPHAGOGASTRODUODENOSCOPY    . ESOPHAGOGASTRODUODENOSCOPY N/A 01/21/2014   Procedure: ESOPHAGOGASTRODUODENOSCOPY (EGD);  Surgeon: Iva Boop, MD;  Location: [facility];  Service: Endoscopy;  Laterality: N/A;  . LAPAROSCOPIC CHOLECYSTECTOMY  07/2008   by Dr. Cyndia Bent  . Thymus resection   08/25/1993  . TRIGGER FINGER RELEASE  05/20/2011   Procedure: RELEASE TRIGGER FINGER/A-1 PULLEY;  Surgeon: [name], MD;  Location: [facility] OR;  Service: Orthopedics;  Laterality: Right;  RIGHT TRIGGER THUMB RELEASE AND RIGHT CARPAL TUNNEL RELEASE    Family History  Problem Relation Age of Onset  . Hypertension Father   . Sickle cell trait Father   . Stroke Paternal Grandmother   . Sickle cell trait Paternal Grandfather   . Lupus Other  Mother's first cousin  . Anesthesia problems Neg Hx   . Hypotension Neg Hx   . Malignant hyperthermia Neg Hx   . Pseudochol deficiency Neg Hx   . Crohn's disease Paternal Aunt   . Diabetes Father        Borderline  . Diabetes Paternal Grandfather   . Diabetes Maternal Grandmother   . Colon cancer Neg Hx   . Colon polyps Neg Hx   . Heart disease Neg Hx   . Kidney disease Neg Hx   . Esophageal cancer Neg Hx   . Gallbladder disease Neg Hx     Social History   Social History  . Marital status: Married    Spouse name: N/A  . Number of children: 4  . Years of education: N/A   Occupational History  . Disabled    Social History Main Topics  . Smoking status: Never Smoker  . Smokeless tobacco: Never Used  . Alcohol use No  . Drug use: No  . Sexual activity: Yes    Birth control/ protection: Injection   Other Topics Concern  . Not on file   Social History Narrative   Lives in a house with husband (s/p incarceration), daughter, 2 sons, and grandson, unemployed, unemployed, no pets, no tob, no etoh, no exercise, no healthy diet, more than or equal to 12 yrs education, christian, has motorized scooter that she uses periodically, husband smokes in the house      Objective:   Physical Exam There were no vitals taken for this visit. General:  Awake. Alert. No acute distress. Sitting watching TV. Family at bedside.  Integument:  Warm & dry. No rash on exposed skin. No bruising. Extremities:  No cyanosis or clubbing.    Lymphatics:  No appreciated cervical or supraclavicular lymphadenoapthy. HEENT:  Moist mucus membranes. No oral ulcers. No scleral injection or icterus. Endotracheal tube in place. PERRL. Cardiovascular:  Regular rate. No edema. No appreciable JVD.  Pulmonary:  Good aeration & clear to auscultation bilaterally. Symmetric chest wall expansion. No accessory muscle use. Abdomen: Soft. Normal bowel sounds. Nondistended. Grossly nontender. Musculoskeletal:  Normal bulk and tone. Hand grip strength 5/5 bilaterally. No joint deformity or effusion appreciated. Neurological:  CN 2-12 grossly in tact. No meningismus. Moving all 4 extremities equally. Symmetric brachioradialis deep tendon reflexes. Psychiatric:  Mood and affect congruent. Speech normal rhythm, rate & tone.   IMAGING  CARDIAC TTE (10/07/13):  LV normal in size with EF 60-65% & no regional wall motion abnormalities. LA & RA normal in size. RV normal in size and function. No aortic stenosis or regurgitation. Aortic root normal in size. No mitral stenosis or regurgitation. No pulmonic stenosis or regurgitation. Trivial tricuspid regurgitation. No pericardial effusion.  PATHOLOGY Left Forearm Skin Biopsy (12/09/10):  Well formed noncaseating granulomas filling the dermis and associated with scattered lymphocytes and occasional multinucleated giant cells. Consistent with cutaneous sarcoidosis.    Assessment & Plan:  45 y.o.  [name]. Jamison Neighbor, M.D. [facility] Pager:  [phone] After 3pm or if no response, call [phone] 1:36 PM 08/04/16

## 2016-08-04 NOTE — Telephone Encounter (Signed)
[facility] for [facility]. PA form placed in provider box for completion. Please print most recent office notes to be faxed along with PA form.  Clovis Pu, RN

## 2016-08-04 NOTE — Telephone Encounter (Signed)
Forms completed and placed in Ms. Martin's office.

## 2016-08-11 NOTE — Telephone Encounter (Signed)
PA approved for oxycodone HCL 5 mg via [place] [facility] until 02/02/2017. Approval number: [id number].  Clovis Pu, RN

## 2016-08-13 ENCOUNTER — Institutional Professional Consult (permissible substitution): Payer: Self-pay | Admitting: Pulmonary Disease

## 2016-08-19 ENCOUNTER — Ambulatory Visit (INDEPENDENT_AMBULATORY_CARE_PROVIDER_SITE_OTHER)
Admission: RE | Admit: 2016-08-19 | Discharge: 2016-08-19 | Disposition: A | Discharge status: Home / Self Care | Payer: Medicaid Other | Source: Ambulatory Visit | Attending: Internal Medicine | Admitting: Internal Medicine

## 2016-08-19 ENCOUNTER — Ambulatory Visit (INDEPENDENT_AMBULATORY_CARE_PROVIDER_SITE_OTHER): Payer: Medicaid Other | Admitting: Internal Medicine

## 2016-08-19 ENCOUNTER — Institutional Professional Consult (permissible substitution): Payer: Self-pay | Admitting: Internal Medicine

## 2016-08-19 ENCOUNTER — Encounter: Payer: Self-pay | Admitting: Internal Medicine

## 2016-08-19 VITALS — BP 124/86 | HR 100 | Ht 64.0 in | Wt 345.0 lb

## 2016-08-19 DIAGNOSIS — I10 Essential (primary) hypertension: Secondary | ICD-10-CM | POA: Diagnosis not present

## 2016-08-19 DIAGNOSIS — D869 Sarcoidosis, unspecified: Secondary | ICD-10-CM

## 2016-08-19 DIAGNOSIS — J45909 Unspecified asthma, uncomplicated: Secondary | ICD-10-CM | POA: Diagnosis not present

## 2016-08-19 MED ORDER — PREDNISONE 10 MG PO TABS: ORAL_TABLET | ORAL | 0 refills | Status: DC

## 2016-08-19 MED ORDER — OMEPRAZOLE 40 MG PO CPDR: 40.0000 mg | DELAYED_RELEASE_CAPSULE | Freq: Every day | ORAL | 11 refills | Status: DC

## 2016-08-19 MED ORDER — LOSARTAN POTASSIUM-HCTZ 100-25 MG PO TABS: 1.0000 | ORAL_TABLET | Freq: Every day | ORAL | 2 refills | Status: DC

## 2016-08-19 MED ORDER — BUDESONIDE-FORMOTEROL FUMARATE 160-4.5 MCG/ACT IN AERO: 2.0000 | INHALATION_SPRAY | Freq: Two times a day (BID) | RESPIRATORY_TRACT | 0 refills | Status: DC

## 2016-08-19 NOTE — Patient Instructions (Addendum)
Losartan 100-25 mg one daily   Prilosec (omeprazole) 40 mg Take 30- 60 min before your first and last meals of the day   Plan A = Automatic = Symbicort 160 Take 2 puffs first thing in am and then another 2 puffs about 12 hours later.    Work on inhaler technique:  relax and gently blow all the way out then take a nice smooth deep breath back in, triggering the inhaler at same time you start breathing in.  Hold for up to 5 seconds if you can. Blow out thru nose. Rinse and gargle with water when done     Plan B = Backup Only use your albuterol as a rescue medication to be used if you can't catch your breath by resting or doing a relaxed purse lip breathing pattern.  - The less you use it, the better it will work when you need it. - Ok to use the inhaler up to 2 puffs  every 4 hours if you must but call for appointment if use goes up over your usual need - Don't leave home without it !!  (think of it like the spare tire for your car)   GERD (REFLUX)  is an extremely common cause of respiratory symptoms just like yours , many times with no obvious heartburn at all.    It can be treated with medication, but also with lifestyle changes including elevation of the head of your bed (ideally with 6 inch  bed blocks),  Smoking cessation, avoidance of late meals, excessive alcohol, and avoid fatty foods, chocolate, peppermint, colas, red wine, and acidic juices such as orange juice.  NO MINT OR MENTHOL PRODUCTS SO NO COUGH DROPS   USE SUGARLESS CANDY INSTEAD (Jolley ranchers or Stover's or Life Savers) or even ice chips will also do - the key is to swallow to prevent all throat clearing. NO OIL BASED VITAMINS - use powdered substitutes.  If not improving >  Prednisone 10 mg take  4 each am x 2 days,   2 each am x 2 days,  1 each am x 2 days and stop     Please remember to go to the  x-ray department downstairs in the basement  for your tests - we will call you with the results when they are available.      See Tammy NP in 6 weeks with all your medications, even over the counter meds, separated in two separate bags, the ones you take no matter what vs the ones you stop once you feel better and take only as needed when you feel you need them.   Tammy  will generate for you a new user friendly medication calendar that will put [place] all on the same page re: your medication use.     Without this process, it simply isn't possible to assure that we are providing  your outpatient care  with  the attention to detail we feel you deserve.   If we cannot assure that you're getting that kind of care,  then we cannot manage your problem effectively from this clinic.  Once you have seen Tammy and we are sure that we're all on the same page with your medication use she will arrange follow up with me.

## 2016-08-19 NOTE — Progress Notes (Signed)
Subjective:     Patient ID: [name], female   DOB: 03-03-1965,     MRN: [id number]  HPI  66 yobf never smoker with severe obesity partially related to chronic steroid use which she is unable specify in terms of details /dateas but initially prednisone was used for MG  Around 1995 s/p thymectomyand then off x years then back on it "for breathing" and then while off it developed lumps dermatologist dx NCG  12/03/10 (path report in EPIC)   and started on prednisone but back on it more than off it x 2013 and last took it around 03/2016 and breathing worse since then referred to pulmonary clinic 08/19/2016 by Dr   Jaci Standard    08/19/2016 1st [facility] office visit/ [name]   Chief Complaint  Patient presents with  . Pulmonary Consult    Referred by Artemio Aly. Pt states dxed with Sarcoid several yrs back. She c/o increased SOB over the past 4 months. She had been on pred for years, and then she stopped due to switching doctors. She c/o cough, esp worse at night occ with white to yellow sputum.    worse sob x 4 m worse when not on symb 160 2bid and avg saba 2 x daily   Sleeps on 3 pillows / no cpap per advance ? Why Doe x 50 ft = MMRC3 = can't walk 100 yards even at a slow pace at a flat grade s stopping due to sob   Says all her symptoms improve on pred, not sure symbicort works (poor hfa/ had not taken on day of ov by 4pm)  No obvious day to day or daytime variability or assoc excess/ purulent sputum or mucus plugs or hemoptysis or cp or chest tightness, subjective wheeze or overt sinus or hb symptoms. No unusual exp hx or h/o childhood pna/ asthma or knowledge of premature birth.  Sleeping ok without nocturnal  or early am exacerbation  of respiratory  c/o's or need for noct saba. Also denies any obvious fluctuation of symptoms with weather or environmental changes or other aggravating or alleviating factors except as outlined above   Current Medications, Allergies, Complete Past  Medical History, Past Surgical History, Family History, and Social History were reviewed in [employer] record.  ROS  The following are not active complaints unless bolded sore throat, dysphagia, dental problems, itching, sneezing,  nasal congestion or excess/ purulent secretions, ear ache,   fever, chills, sweats, unintended wt loss, classically pleuritic or exertional cp,  orthopnea pnd or leg swelling, presyncope, palpitations, abdominal pain, anorexia, nausea, vomiting, diarrhea  or change in bowel or bladder habits, change in stools or urine, dysuria,hematuria,  rash, arthralgias, visual complaints, headache, numbness, weakness or ataxia or problems with walking or coordination,  change in mood/affect or memory.         Review of Systems     Objective:   Physical Exam Massively  obese bf nad walks with rollator / nad at rest   Wt Readings from Last 3 Encounters:  08/19/16 (!) 345 lb (156.5 kg)  08/02/16 (!) 350 lb (158.8 kg)  12/11/15 (!) 349 lb (158.3 kg)    Vital signs reviewed - Note on arrival 02 sats  98% on RA     HEENT: nl dentition, turbinates bilaterally, and oropharynx. Nl external ear canals without cough reflex   NECK :  without JVD/Nodes/TM/ nl carotid upstrokes bilaterally   LUNGS: no acc muscle use,  Nl contour chest  with mostly pseudowheeze / bs are distant bilaterally    CV:  RRR  no s3 or murmur or increase in P2, and no edema   ABD:  Massively obese/ soft  nontender with limited inspiratory excursion in the supine position. No bruits or organomegaly appreciated, bowel sounds nl  MS:  Nl gait/ ext warm without deformities, calf tenderness, cyanosis or clubbing No obvious joint restrictions   SKIN: warm and dry without lesions    NEURO:  alert, approp, nl sensorium with  no motor or cerebellar deficits apparent.    CXR PA and Lateral:   08/19/2016 :    I personally reviewed images and agree with radiology impression as follows:    Cardiac shadow is within normal limits. Postoperative changes are again seen. The lungs are well aerated with scarring in the bases left slightly greater than right. No focal infiltrate is seen. No bony abnormality is noted. Changes of prior thymus resection are again seen.           Assessment:

## 2016-08-20 ENCOUNTER — Encounter: Payer: Self-pay | Admitting: Student in an Organized Health Care Education/Training Program

## 2016-08-20 ENCOUNTER — Ambulatory Visit (INDEPENDENT_AMBULATORY_CARE_PROVIDER_SITE_OTHER): Payer: Medicaid Other | Admitting: Student in an Organized Health Care Education/Training Program

## 2016-08-20 DIAGNOSIS — Z659 Problem related to unspecified psychosocial circumstances: Secondary | ICD-10-CM

## 2016-08-20 NOTE — Assessment & Plan Note (Addendum)
PFT's 11/30/2007 restrictive  - 08/19/2016  After extensive coaching HFA effectiveness =    75% > continue symb 160 2bid with low threshold to taper to 80 2bid but no samples available to try  - trial off acei 08/19/2016   DDX of  difficult airways management almost all start with A and  include Adherence, Ace Inhibitors, Acid Reflux, Active Sinus Disease, Alpha 1 Antitripsin deficiency, Anxiety masquerading as Airways dz,  ABPA,  Allergy(esp in young), Aspiration (esp in elderly), Adverse effects of meds,  Active smokers, A bunch of PE's (a small clot burden can't cause this syndrome unless there is already severe underlying pulm or vascular dz with poor reserve) plus two Bs  = Bronchiectasis and Beta blocker use..and one C= CHF  Adherence is always the initial "prime suspect" and is a multilayered concern that requires a "trust but verify" approach in every patient - starting with knowing how to use medications, especially inhalers, correctly, keeping up with refills and understanding the fundamental difference between maintenance and prns vs those medications only taken for a very short course and then stopped and not refilled.  - see hfa teaching - needs to return with all meds in hand using a trust but verify approach to confirm accurate Medication  Reconciliation The principal here is that until we are certain that the  patients are doing what we've asked, it makes no sense to ask them to do more.  -  To keep things simple, I have asked the patient to first separate medicines that are perceived as maintenance, that is to be taken daily "no matter what", from those medicines that are taken on only on an as-needed basis and I have given the patient examples of both, and then return to see our NP to generate a  detailed  medication calendar which should be followed until the next physician sees the patient and updates it.    ACEi adverse effects at the  top of the usual list of suspects and the only way  to rule it out is a trial off > see a/p     ? Acid (or non-acid) GERD > always difficult to exclude as up to 75% of pts in some series report no assoc GI/ Heartburn symptoms> rec max (24h)  acid suppression and diet restrictions/ reviewed and instructions given in writing.   ? Anxiety/ VCD masquerading as asthma > usually at the bottom of this list of usual suspects but should be much higher on this pt's based on H and P and note already on psychotropics .    ? Allergy >  If flares can do pred x 6 days only while waiting to see how she does off acei x 6 weeks but I strongly doubt allergy here   Total time devoted to counseling  > 50 % of initial 60 min office visit:  review case with pt/ discussion of options/alternatives/ personally creating written customized instructions  in presence of pt  then going over those specific  Instructions directly with the pt including how to use all of the meds but in particular covering each new medication in detail and the difference between the maintenance= "automatic" meds and the prns using an action plan format for the latter (If this problem/symptom => do that organization reading Left to right).  Please see AVS from this visit for a full list of these instructions which I personally wrote for this pt and  are unique to this visit.

## 2016-08-20 NOTE — Progress Notes (Signed)
   CC: difficulty obtaining medication  HPI: [name] is a 51 y.o. female with PMH significant for chronic pain who presents to [facility] today to establish care as a new patient.  Patient was given a script for one month of her pain medication (oxycodone 5 mg 90#) and asked to follow up with new PCP for further refills on medication. Because there was a note to the pharmacy asking that the medication not be subsequently refilled, there was some confusion at the pharmacy and she was not allowed to fill the script.  This was worked out with our office and the pharmacy, and a new script for oxy 5 mg 90# was provided, pharmacy asked to discard initial script. When the patient took the second script to the pharmacy, it seems the first script had already been run through the system and then exceeded the approved number of pills she could receive for that prior authorization because of the prior prescription.  In today's visit , I contacted [place] [facility] and explained the situation. [place] [facility] was able to back-date the original prior authorization. I contacted the patient's pharmacy and was told that the script was still on file and that the patient can now go to the pharmacy and pick up the medication. Because of this I did not provide another script.   tracks case number [id number]  CC, SH/smoking status, and VS noted.  Objective: BP 128/74   Pulse (!) 118   Temp 98.5 F (36.9 C) (Oral)   Ht 5\' 4"  (1.626 m)   Wt (!) 345 lb 9.6 oz (156.8 kg)   SpO2 98%   BMI 59.32 kg/m  GEN: NAD, alert, cooperative, and pleasant.  Assessment and plan:  Social problem - worked out medication problem/prior auth as noted above - patient asked to follow up for continued management of her chronic diseases  , MD,MS,  PGY2 08/22/2016 9:09 AM

## 2016-08-20 NOTE — Assessment & Plan Note (Signed)
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  By themselves they don't actually cause a problem, much like oxygen can't by itself start a fire, but they certainly serve as a powerful catalyst or enhancer for any "fire"  or inflammatory process in the upper airway, be it caused by an ET  tube or more commonly reflux (especially in the obese or pts with known GERD or who are on biphoshonates).    In the era of ARB near equivalency until we have a better handle on the reversibility of the airway problem, it just makes sense to avoid ACEI  entirely  Here  Try losartan 100-25 instead

## 2016-08-20 NOTE — Assessment & Plan Note (Signed)
Derm  dx NCG  12/03/10 (path report in EPIC)    Skin is now clear and no evidence of pulmonary sarcoid on cxr  A good rule of thumb is that >95% of pts with active sarcoid in any organ will have some plain cxr changes - on the other hand  if there are active pulmonary symptoms the cxr will look much worse than the patient:  No evidence of either scenario here/ strongly doubt active dz   No role for prednisone for this

## 2016-08-20 NOTE — Progress Notes (Signed)
Spoke with pt and notified of results per Dr. Wert. Pt verbalized understanding and denied any questions. 

## 2016-08-20 NOTE — Assessment & Plan Note (Signed)
Restrictive changes on pfts 11/30/2007   Body mass index is 59.22 kg/m.  -  trending down slightly  Lab Results  Component Value Date   TSH 0.909 10/07/2013     Contributing to gerd risk/ doe/reviewed the need and the process to achieve and maintain neg calorie balance > defer f/u primary care including intermittently monitoring thyroid status

## 2016-08-22 DIAGNOSIS — Z659 Problem related to unspecified psychosocial circumstances: Secondary | ICD-10-CM | POA: Insufficient documentation

## 2016-08-22 DIAGNOSIS — Z609 Problem related to social environment, unspecified: Secondary | ICD-10-CM | POA: Insufficient documentation

## 2016-08-22 NOTE — Assessment & Plan Note (Signed)
-   worked out medication problem/prior auth as noted above - patient asked to follow up for continued management of her chronic diseases

## 2016-09-13 ENCOUNTER — Ambulatory Visit (INDEPENDENT_AMBULATORY_CARE_PROVIDER_SITE_OTHER): Payer: Medicaid Other | Admitting: Student in an Organized Health Care Education/Training Program

## 2016-09-13 ENCOUNTER — Encounter: Payer: Self-pay | Admitting: Student in an Organized Health Care Education/Training Program

## 2016-09-13 DIAGNOSIS — M25562 Pain in left knee: Secondary | ICD-10-CM

## 2016-09-13 DIAGNOSIS — M25561 Pain in right knee: Secondary | ICD-10-CM

## 2016-09-13 DIAGNOSIS — G8929 Other chronic pain: Secondary | ICD-10-CM

## 2016-09-13 MED ORDER — METHYLPREDNISOLONE ACETATE 40 MG/ML IJ SUSP
40.0000 mg | Freq: Once | INTRAMUSCULAR | Status: AC
  Administered 2016-09-13: 40 mg via INTRAMUSCULAR

## 2016-09-13 NOTE — Patient Instructions (Signed)
It was a pleasure seeing you today in our clinic. We did a knee injection in each knee. Here is the treatment plan we have discussed and agreed upon together:  - You may be sore over the next day, however please continue to move around so the joint does not get stiff. - Use ibuprofen as needed for pain at home - If you notice an area of redness with warmth, swelling, pain, or you develop fevers, these would be reasons to call our office back or come in to be seen.  A consult was placed to bariatrics at today's visit.  You will receive a call to schedule an appointment. If you do not receive a call within two weeks please call our office so we can place the consult again.  Our clinic's number is [phone]. Please call with questions or concerns about what we discussed today.  Be well, Dr. Mosetta Putt

## 2016-09-13 NOTE — Progress Notes (Signed)
   CC: knee pain  HPI: [name] is a 51 y.o. female with PMH significant for chronic pain management due to knee pain who presents to [facility] today for bilateral knee injections  Overdue health maintenance Foot exam performed today  Knee pain, chronic - history of severe osteoarthritis as well as severe obesity BMI previously >60 - cortisone shots years ago stopped working - oxycodone 1-3 per day depending on activity - naproxen one daily - sometimes tylenol or aleve - has attempted weight loss with some success  Review of Symptoms:  See HPI for ROS.   CC, SH/smoking status, and VS noted.  Objective: BP 124/82   Pulse (!) 118   Temp 98.2 F (36.8 C) (Oral)   Ht 5\' 4"  (1.626 m)   Wt (!) 341 lb (154.7 kg)   SpO2 97%   BMI 58.53 kg/m  GEN: NAD, alert, cooperative, and pleasant. MSK: anterior knee pain to palpation, no crepitus, pain with passive motion at the knee, ambulates with walker  INJECTION: Patient was given informed consent, signed copy in the chart. Appropriate time out was taken. Area prepped and draped in usual sterile fashion. 4 cc of methylprednisolone 40 mg/ml plus  1 cc of 1% lidocaine without epinephrine was injected into the right and left knees using a(n) anterolateral approach. The patient tolerated the procedure well. There were no complications. Post procedure instructions were given.   Assessment and plan:  Knee pain, bilateral - knee pain secondary to severe osteoarthritis - no red flags for joint infection - 40 mg solumedrol bilateral knee joint injection today in the office - continue ibupron PRN pain, and pain management per chronic pain plan - encourage continued movement at the joint - bariatric referral placed today - return precautions provided - follow up as needed - ultimately the best treatment for her chronic knee pain will be weight loss   Meds ordered this encounter  Medications  . methylPREDNISolone acetate (DEPO-MEDROL)  injection 40 mg    , MD,MS,  PGY2 09/13/2016 4:41 PM

## 2016-09-13 NOTE — Assessment & Plan Note (Signed)
-   knee pain secondary to severe osteoarthritis - no red flags for joint infection - 40 mg solumedrol bilateral knee joint injection today in the office - continue ibupron PRN pain, and pain management per chronic pain plan - encourage continued movement at the joint - bariatric referral placed today - return precautions provided - follow up as needed - ultimately the best treatment for her chronic knee pain will be weight loss

## 2016-09-28 ENCOUNTER — Encounter: Payer: Self-pay | Admitting: Adult Health

## 2016-10-12 ENCOUNTER — Encounter: Payer: Self-pay | Admitting: Adult Health

## 2016-10-19 ENCOUNTER — Encounter: Payer: Self-pay | Admitting: Student in an Organized Health Care Education/Training Program

## 2016-10-19 ENCOUNTER — Other Ambulatory Visit: Payer: Self-pay | Admitting: *Deleted

## 2016-10-19 DIAGNOSIS — J45909 Unspecified asthma, uncomplicated: Secondary | ICD-10-CM

## 2016-10-21 ENCOUNTER — Encounter: Payer: Self-pay | Admitting: Student in an Organized Health Care Education/Training Program

## 2016-10-21 MED ORDER — METFORMIN HCL 850 MG PO TABS: 850.0000 mg | ORAL_TABLET | Freq: Two times a day (BID) | ORAL | 1 refills | Status: DC

## 2016-10-21 MED ORDER — BUDESONIDE-FORMOTEROL FUMARATE 160-4.5 MCG/ACT IN AERO: 2.0000 | INHALATION_SPRAY | Freq: Two times a day (BID) | RESPIRATORY_TRACT | 0 refills | Status: DC

## 2016-10-22 ENCOUNTER — Other Ambulatory Visit: Payer: Self-pay | Admitting: Student in an Organized Health Care Education/Training Program

## 2016-10-22 DIAGNOSIS — G8929 Other chronic pain: Secondary | ICD-10-CM

## 2016-10-22 MED ORDER — OXYCODONE HCL 5 MG PO TABS: 5.0000 mg | ORAL_TABLET | Freq: Three times a day (TID) | ORAL | 0 refills | Status: DC | PRN

## 2016-10-23 ENCOUNTER — Encounter: Payer: Self-pay | Admitting: Student in an Organized Health Care Education/Training Program

## 2016-10-31 ENCOUNTER — Encounter (HOSPITAL_COMMUNITY): Payer: Self-pay | Admitting: *Deleted

## 2016-10-31 ENCOUNTER — Ambulatory Visit (HOSPITAL_COMMUNITY)
Admission: EM | Admit: 2016-10-31 | Discharge: 2016-10-31 | Disposition: A | Discharge status: Home / Self Care | Payer: Medicaid Other | Attending: Urgent Care | Admitting: Urgent Care

## 2016-10-31 DIAGNOSIS — H9201 Otalgia, right ear: Secondary | ICD-10-CM

## 2016-10-31 DIAGNOSIS — Z9109 Other allergy status, other than to drugs and biological substances: Secondary | ICD-10-CM

## 2016-10-31 DIAGNOSIS — R51 Headache: Secondary | ICD-10-CM | POA: Diagnosis not present

## 2016-10-31 DIAGNOSIS — H6981 Other specified disorders of Eustachian tube, right ear: Secondary | ICD-10-CM

## 2016-10-31 DIAGNOSIS — R519 Headache, unspecified: Secondary | ICD-10-CM

## 2016-10-31 MED ORDER — DOXYCYCLINE HYCLATE 100 MG PO CAPS: 100.0000 mg | ORAL_CAPSULE | Freq: Two times a day (BID) | ORAL | 0 refills | Status: DC

## 2016-10-31 NOTE — ED Triage Notes (Signed)
Patient reports 1 day history of ear fullness to right ear.   Patient reports left sided facial pain and swelling x 1 day with headache.

## 2016-10-31 NOTE — ED Provider Notes (Signed)
MRN: [id number] DOB: 1965-05-31  Subjective:   [name] is a 51 y.o. female presenting for chief complaint of Facial Pain; Ear Fullness; and Headache  Reports 1 day history of left sided facial pain, right ear pain, frontal headache. Denies fever, sinus congestion, sinus pain, sore throat, cough, no facial rashes. Patient takes cetirizine for her allergies. Denies smoking cigarettes. Has tried naproxen, has used ear flushes for her right ear.   No current facility-administered medications for this encounter.   Current Outpatient Prescriptions:  .  albuterol (PROAIR HFA) 108 (90 Base) MCG/ACT inhaler, Inhale 2 puffs into the lungs every 4 (four) hours as needed., Disp: 8.5 Inhaler, Rfl: 2 .  budesonide-formoterol (SYMBICORT) 160-4.5 MCG/ACT inhaler, Inhale 2 puffs into the lungs 2 (two) times daily., Disp: 1 Inhaler, Rfl: 0 .  cetirizine (ZYRTEC) 10 MG tablet, Take 1 tablet (10 mg total) by mouth daily., Disp: 30 tablet, Rfl: 11 .  cloNIDine (CATAPRES) 0.1 MG tablet, TAKE 1 TABLET (0.1 MG TOTAL) BY MOUTH 3 (THREE) TIMES DAILY. ONLY TO BE TAKEN IF PATCH FALLS OFF, Disp: 90 tablet, Rfl: 11 .  FLUoxetine (PROZAC) 40 MG capsule, Take 1 capsule (40 mg total) by mouth daily., Disp: 30 capsule, Rfl: 11 .  fluticasone (FLONASE) 50 MCG/ACT nasal spray, Place 2 sprays into both nostrils daily. (Patient taking differently: Place 2 sprays into both nostrils daily as needed. ), Disp: 16 g, Rfl: 6 .  furosemide (LASIX) 20 MG tablet, Take 20 mg by mouth daily as needed., Disp: , Rfl:  .  losartan-hydrochlorothiazide (HYZAAR) 100-25 MG tablet, Take 1 tablet by mouth daily., Disp: 30 tablet, Rfl: 2 .  metFORMIN (GLUCOPHAGE) 850 MG tablet, Take 1 tablet (850 mg total) by mouth 2 (two) times daily with a meal., Disp: 180 tablet, Rfl: 1 .  montelukast (SINGULAIR) 10 MG tablet, Take 1 tablet (10 mg total) by mouth at bedtime., Disp: 30 tablet, Rfl: 11 .  naproxen (NAPROSYN) 500 MG tablet, Take 1 tablet (500  mg total) by mouth 2 (two) times daily with a meal., Disp: 30 tablet, Rfl: 1 .  omeprazole (PRILOSEC) 40 MG capsule, Take 1 capsule (40 mg total) by mouth daily., Disp: 60 capsule, Rfl: 11 .  oxybutynin (DITROPAN) 5 MG tablet, TAKE 1 TABLET (5 MG TOTAL) BY MOUTH 2 (TWO) TIMES DAILY., Disp: 60 tablet, Rfl: 3 .  oxyCODONE (OXY IR/ROXICODONE) 5 MG immediate release tablet, Take 1 tablet (5 mg total) by mouth every 8 (eight) hours as needed for severe pain., Disp: 90 tablet, Rfl: 0 .  polyethylene glycol (MIRALAX / GLYCOLAX) packet, Take 17 g by mouth daily as needed for mild constipation., Disp: , Rfl:  .  sitaGLIPtin (JANUVIA) 100 MG tablet, Take 1 tablet (100 mg total) by mouth daily., Disp: 90 tablet, Rfl: 3 .  sulindac (CLINORIL) 200 MG tablet, Take 1 tablet (200 mg total) by mouth 2 (two) times daily as needed., Disp: 180 tablet, Rfl: 0 .  cloNIDine (CATAPRES-TTS-3) 0.3 mg/24hr patch, PLACE 1 PATCH (0.3 MG TOTAL) ONTO THE SKIN ONCE A WEEK., Disp: 4 patch, Rfl: 8 .  predniSONE (DELTASONE) 10 MG tablet, Take  4 each am x 2 days,   2 each am x 2 days,  1 each am x 2 days and stop, Disp: 14 tablet, Rfl: 0   [name] is allergic to apple; banana; and shrimp [shellfish allergy].  [name]  has a past medical history of ALLERGIC RHINITIS; Anxiety; Arthritis; Asthma; Bronchitis; CAP (community acquired pneumonia) (02/20/2015); Carpal  tunnel syndrome; Chronic back pain; Constipation; Depression; Diabetes (HCC); Dizziness; Eczema; Gallstones (2010); GERD (gastroesophageal reflux disease); Headache(784.0); Hemorrhoids; Hypertension; Hypoventilation associated with obesity syndrome (HCC); Insomnia; Irritable bladder; Joint pain; Joint swelling; Lung disease; Myasthenia gravis (1994); Myasthenia gravis; Nocturia; OSA (obstructive sleep apnea); Osteoarthritis; Pelvic inflammatory disease (PID); Peptic ulcer; Pneumonia; Pulmonary infiltrates (2008/2009); Rheumatoid arthritis(714.0); Shortness of breath; Skin irritation;  Trigger finger; Urinary frequency; Urinary urgency; and Viral meningitis. Also  has a past surgical history that includes Cesarean section (09/15/2004); Bronchoscopy (08/2007); Thymus resection (08/25/1993); Laparoscopic cholecystectomy (07/2008); cortisone injection; Esophagogastroduodenoscopy; Trigger finger release (05/20/2011); Carpal tunnel release (05/20/2011); Esophagogastroduodenoscopy (N/A, 01/21/2014); and Colonoscopy (N/A, 01/21/2014).  Objective:   Vitals: BP (!) 147/98 (BP Location: Right Arm)   Pulse (!) 101   Temp 99.6 F (37.6 C)   Resp (!) 22   SpO2 100%   Physical Exam  Constitutional: She is oriented to person, place, and time. She appears well-developed and well-nourished.  HENT:  TM's intact bilaterally, no effusions or erythema. Nasal turbinates pink and moist, nasal passages patent. Exquisite left maxillary sinus tenderness. Oropharynx clear, mucous membranes moist.  Eyes: Right eye exhibits no discharge. Left eye exhibits no discharge. No scleral icterus.  Neck: Normal range of motion. Neck supple.  Cardiovascular: Normal rate.   Pulmonary/Chest: Effort normal.  Lymphadenopathy:    She has no cervical adenopathy.  Neurological: She is alert and oriented to person, place, and time.  Skin: Skin is warm and dry.  Psychiatric: She has a normal mood and affect.   Assessment and Plan :   Facial pain  Acute nonintractable headache, unspecified headache type  Right ear pain  Environmental allergies  Dysfunction of right eustachian tube  I recommended patient start management of her allergies with Zyrtec and Sudafed. I suspect this is the underlying problem. Her sinus tenderness is very superficial and not consistent with physical exam findings. I did offer patient a script for doxycycline to cover for cellulitis or sinusitis if her symptoms do not improve. Return-to-clinic precautions discussed, patient verbalized understanding.   Wallis Bamberg, PA-C [facility]  10/31/2016  3:16 PM    Wallis Bamberg, PA-C 11/02/16 [id number]

## 2016-10-31 NOTE — Discharge Instructions (Addendum)
You may take 500mg  Tylenol with 6 hours with naproxen 500mg  every 12 hours for pain and inflammation. Keep taking Zyrtec 10mg  daily as well. You may also use over-the-counter pseudoephedrine 30mg  every 8 hours as needed for nasal congestion, eustachian tube dysfunction. If you are not better in th next 3-4 days, you can try doxycycline to address a sinusitis, a sinus infection.

## 2016-11-17 ENCOUNTER — Encounter: Payer: Self-pay | Admitting: Student in an Organized Health Care Education/Training Program

## 2016-11-17 ENCOUNTER — Ambulatory Visit (INDEPENDENT_AMBULATORY_CARE_PROVIDER_SITE_OTHER): Payer: Medicaid Other | Admitting: Student in an Organized Health Care Education/Training Program

## 2016-11-17 VITALS — BP 130/90 | HR 95 | Temp 97.8°F | Wt 339.8 lb

## 2016-11-17 DIAGNOSIS — M170 Bilateral primary osteoarthritis of knee: Secondary | ICD-10-CM

## 2016-11-17 DIAGNOSIS — Z6841 Body Mass Index (BMI) 40.0 and over, adult: Secondary | ICD-10-CM | POA: Diagnosis not present

## 2016-11-17 DIAGNOSIS — L309 Dermatitis, unspecified: Secondary | ICD-10-CM | POA: Diagnosis present

## 2016-11-17 DIAGNOSIS — Z23 Encounter for immunization: Secondary | ICD-10-CM

## 2016-11-17 MED ORDER — HYDROCORTISONE 0.5 % EX OINT: 1.0000 "application " | TOPICAL_OINTMENT | Freq: Two times a day (BID) | CUTANEOUS | 0 refills | Status: DC

## 2016-11-17 NOTE — Assessment & Plan Note (Addendum)
-   Referral to healthy weight and wellness - continue to encourage weight loss - not interested in an internal dietetics referral today

## 2016-11-17 NOTE — Assessment & Plan Note (Signed)
-   PT referral for TENS - encourage weight loss - not yet time for joint injection or med refill

## 2016-11-17 NOTE — Assessment & Plan Note (Signed)
Uncertain etiology, appears to be skin irritation or ectopic dermatitis, likely exacerbated by alcohol spray. - Corticosteroid ointment provided - Advised to discontinue alcohol spray - Follow-up as needed

## 2016-11-17 NOTE — Progress Notes (Signed)
   CC: Follow up knee pain,. rash  HPI: [name] is a 51 y.o. female with PMH significant for obesity, chronic pain, diabetes, GERD who presents to [facility] today for follow up pain and new rash.  Bilateral Knee Pain - Osteoarthritis bilateral - 5 mg Roxicodone 3-4 times daily, not yet time for refill - Last joint injections with steroids were 2 months ago - Has been focusing on weight loss with portion control, has had some weight loss over the last year - Interested in referral to healthy weight and wellness for integrative weight loss therapy - Asks for PT referral for TENS  Rash  - Right lateral neck, skin darkening, has been going on for 3 week - Pruritic - Has not seen a rash, just feels itchiness - Has used alcohol spray, and then had burning with this - No other medications tried - No new soaps, lotions, detergents or other identifiable irritants  Review of Symptoms:  See HPI for ROS.   CC, SH/smoking status, and VS noted.  Objective: BP 130/90   Pulse 95   Temp 97.8 F (36.6 C) (Oral)   Wt (!) 339 lb 12.8 oz (154.1 kg)   SpO2 99%   BMI 58.33 kg/m  GEN: NAD, alert, cooperative, and pleasant SKIN: +darkness on right lateral neck, no visible vesicles. Some mild skin irritation, no warmth or drainage NEURO: II-XII grossly intact PSYCH: AAOx3, appropriate affect  Flu Vaccine: Patient received flu shot today  Assessment and plan:  Class 3 severe obesity due to excess calories with serious comorbidity and body mass index (BMI) of 50.0 to 59.9 in adult [facility]) - Referral to healthy weight and wellness - continue to encourage weight loss - not interested in an internal dietetics referral today  DJD (degenerative joint disease) of knee - PT referral for TENS - encourage weight loss - not yet time for joint injection or med refill  Dermatitis Uncertain etiology, appears to be skin irritation or ectopic dermatitis, likely exacerbated by alcohol spray. -  Corticosteroid ointment provided - Advised to discontinue alcohol spray - Follow-up as needed    Orders Placed This Encounter  Procedures  . Ambulatory referral to Physical Therapy    Referral Priority:   Routine    Referral Type:   Physical Medicine    Referral Reason:   Specialty Services Required    Requested Specialty:   Physical Therapy    Number of Visits Requested:   1  . Ambulatory referral to [facility]    Referral Priority:   Routine    Referral Type:   Consultation    Referral Reason:   Specialty Services Required    Requested Specialty:   Family Medicine    Number of Visits Requested:   1    Meds ordered this encounter  Medications  . hydrocortisone ointment 0.5 %    Sig: Apply 1 application topically 2 (two) times daily.    Dispense:  30 g    Refill:  0     Howard Pouch, MD,MS,  PGY2 11/17/2016 4:55 PM

## 2016-11-17 NOTE — Patient Instructions (Signed)
It was a pleasure seeing you today in our clinic. Today we discussed your neck rash and your knee pain. Here is the treatment plan we have discussed and agreed upon together:  - Please use the ointment on your neck twice daily  - Please continue your weight loss efforts!   Our clinic's number is [phone]. Please call with questions or concerns about what we discussed today.  Be well, Dr. Mosetta Putt

## 2016-11-26 ENCOUNTER — Other Ambulatory Visit: Payer: Self-pay | Admitting: Student in an Organized Health Care Education/Training Program

## 2016-11-26 DIAGNOSIS — J45909 Unspecified asthma, uncomplicated: Secondary | ICD-10-CM

## 2016-11-26 DIAGNOSIS — L309 Dermatitis, unspecified: Secondary | ICD-10-CM

## 2016-11-26 MED ORDER — HYDROCORTISONE 0.5 % EX OINT: 1.0000 "application " | TOPICAL_OINTMENT | Freq: Two times a day (BID) | CUTANEOUS | 0 refills | Status: DC

## 2016-11-26 MED ORDER — OXYBUTYNIN CHLORIDE 5 MG PO TABS: 5.0000 mg | ORAL_TABLET | Freq: Two times a day (BID) | ORAL | 3 refills | Status: DC

## 2016-11-26 MED ORDER — MONTELUKAST SODIUM 10 MG PO TABS: 10.0000 mg | ORAL_TABLET | Freq: Every day | ORAL | 11 refills | Status: DC

## 2016-11-26 MED ORDER — BUDESONIDE-FORMOTEROL FUMARATE 160-4.5 MCG/ACT IN AERO: 2.0000 | INHALATION_SPRAY | Freq: Two times a day (BID) | RESPIRATORY_TRACT | 0 refills | Status: DC

## 2016-11-29 ENCOUNTER — Ambulatory Visit: Payer: Medicaid Other | Attending: Family Medicine

## 2016-11-29 ENCOUNTER — Telehealth: Payer: Self-pay

## 2016-11-29 DIAGNOSIS — M25562 Pain in left knee: Secondary | ICD-10-CM | POA: Insufficient documentation

## 2016-11-29 DIAGNOSIS — M25661 Stiffness of right knee, not elsewhere classified: Secondary | ICD-10-CM | POA: Diagnosis present

## 2016-11-29 DIAGNOSIS — M6281 Muscle weakness (generalized): Secondary | ICD-10-CM | POA: Diagnosis present

## 2016-11-29 DIAGNOSIS — M25561 Pain in right knee: Secondary | ICD-10-CM | POA: Diagnosis present

## 2016-11-29 DIAGNOSIS — G8929 Other chronic pain: Secondary | ICD-10-CM | POA: Diagnosis present

## 2016-11-29 DIAGNOSIS — M545 Low back pain: Secondary | ICD-10-CM | POA: Diagnosis present

## 2016-11-29 DIAGNOSIS — M25662 Stiffness of left knee, not elsewhere classified: Secondary | ICD-10-CM | POA: Insufficient documentation

## 2016-11-29 NOTE — Telephone Encounter (Signed)
Pt informed. Raini Tiley T Jalon Squier, CMA  

## 2016-11-29 NOTE — Telephone Encounter (Signed)
-----   Message from Howard Pouch, MD sent at 11/26/2016 10:21 AM EDT ----- Regarding: refills Please let patient know that oxybutynin, montelukast and symbicort refills were sent to her pharmacy [facility] on [address].  Thank you

## 2016-11-29 NOTE — Therapy (Signed)
[place], [place], [address] Phone: [phone]   Fax:  [phone]  Physical Therapy Evaluation/ Discharge  Patient Details  Name: [name] MRN: [id number] Date of Birth: Apr 04, 1965 Referring Provider: Everrett Coombe, MD   Encounter Date: 11/29/2016  PT End of Session - 11/29/16 1536    Visit Number  1    Number of Visits  1    Date for PT Re-Evaluation  11/29/16    Authorization Type  MCD    PT Start Time  0340    PT Stop Time  0415    PT Time Calculation (min)  35 min    Activity Tolerance  Patient limited by pain;Patient tolerated treatment well    Behavior During Therapy  [facility] for tasks assessed/performed       Past Medical History:  Diagnosis Date  . ALLERGIC RHINITIS    takes Zyrtec daily  . Anxiety   . Arthritis   . Asthma    Albuterol prn;Symbicort daily and Singulair daily  . Bronchitis    hx of  . CAP (community acquired pneumonia) 02/20/2015  . Carpal tunnel syndrome    right  . Chronic back pain   . Constipation    Miralax prn  . Depression    takes Cymbalta daily  . Diabetes ([place])   . Dizziness    pt states she gets off balance occasionally  . Eczema   . Gallstones 2010  . GERD (gastroesophageal reflux disease)    takes Omeprazole daily  . Headache(784.0)    couple of times a week  . Hemorrhoids   . Hypertension    takes Prinizide daily and Clonidine on Mondays  . 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 Insomnia   . Irritable bladder   . Joint pain   . Joint swelling   . Lung disease    scleraderma  . Myasthenia gravis 1994   Positive Acetylcholine receptor Ab and single fiber EMG Dr. Barbaraann Share [facility] 1996- Dr. Rosiland Oz 1998, Dr Gaynell Face 2008 [facility]- Failed prednisone due to weight gain, stopped imuran/mestinon due to finances- Now with quiescent disease per Dr Gaynell Face and no flares in many years  . Myasthenia gravis    . Nocturia   . OSA (obstructive sleep apnea)    RDI 10 on PSG 10/09  . Osteoarthritis   . Pelvic inflammatory disease (PID)   . Peptic ulcer   . Pneumonia    hx of;last time about 1-12yr ago  . 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 VChesley Mires 10/30/2007 ANA positive, ANA titer  negative, RF less than 20  . Rheumatoid arthritis(714.0)   . Shortness of breath    can be sitting as well as exertion  . Skin irritation    skin itchy  . Trigger finger   . Urinary frequency    takes Ditropan daily  . Urinary urgency   . Viral meningitis    history of viral meningitis    Past Surgical History:  Procedure Laterality Date  . BRONCHOSCOPY  08/2007  . CESAREAN SECTION  09/15/2004  . cortisone injection     receives an injection every 389month . ESOPHAGOGASTRODUODENOSCOPY    . LAPAROSCOPIC CHOLECYSTECTOMY  07/2008   by Dr. ChNeldon Mc. Thymus resection  08/25/1993    There were no vitals filed for this visit.   Subjective Assessment - 11/29/16 1541  Subjective  She reports bilateral knee pain She reports she is at end stage but due to weight she is not canidate for TKA at this time , LBP. Years ago she was in our clinic . She received TENS unit at that time. She reported benefit in past at home with TENS for knees and lower back pain.     Limitations  Walking;Standing    Diagnostic tests  Xray : a few years ago.     Patient Stated Goals  Decreased pain with TENS    Currently in Pain?  Yes    Pain Score  10-Worst pain ever    Pain Location  Knee    Pain Orientation  Right;Left    Pain Descriptors / Indicators  Burning;Aching    Pain Type  Chronic pain    Pain Onset  More than a month ago    Pain Frequency  Constant    Aggravating Factors   weight bearing    Pain Relieving Factors  Meds ,     Multiple Pain Sites  Yes    Pain Score  8    Pain Location  Back    Pain Orientation  Left;Right;Lower;Posterior    Pain Descriptors / Indicators   Tightness;Aching;Tingling    Pain Type  Chronic pain    Pain Onset  More than a month ago    Pain Frequency  Constant    Aggravating Factors   Walking, sitting     Pain Relieving Factors  meds.          [facility] PT Assessment - 11/29/16 0001      Assessment   Medical Diagnosis  chronic back and knee pain.     Referring Provider  Everrett Coombe, MD    Onset Date/Surgical Date  -- years   years   Next MD Visit  3 months    Prior Therapy  10 years or more      Precautions   Precautions  Fall      Restrictions   Weight Bearing Restrictions  No      Balance Screen   Has the patient fallen in the past 6 months  No    Has the patient had a decrease in activity level because of a fear of falling?   -- limited by pain /OA   limited by pain /OA   Is the patient reluctant to leave their home because of a fear of falling?   No      Prior Function   Level of Independence  Requires assistive device for independence;Needs assistance with ADLs;Needs assistance with homemaking    Vocation  Unemployed      Cognition   Overall Cognitive Status  Within Functional Limits for tasks assessed      ROM / Strength   AROM / PROM / Strength  AROM;Strength      AROM   AROM Assessment Site  Knee    Right/Left Knee  Right;Left    Right Knee Extension  -30    Right Knee Flexion  70    Left Knee Extension  -10    Left Knee Flexion  100      Strength   Strength Assessment Site  Knee    Right/Left Knee  Right;Left    Right Knee Flexion  4-/5    Right Knee Extension  4-/5    Left Knee Flexion  4/5    Left Knee Extension  4/5      Ambulation/Gait  Gait Comments  Walks with  RW             Objective measurements completed on examination: See above findings.              PT Education - 11/29/16 1616    Education provided  Yes    Education Details  instructed in operation and application of TENs unit. She was able to describe appropriate placements and modifications of  parameters correctly    Person(s) Educated  Patient    Methods  Explanation    Comprehension  Verbalized understanding                  Plan - 11/29/16 1618    Clinical Impression Statement  [name] is at end stage OA in knees and needs TKA but is not a canidate at this time. She benefited with pain relief with use of TENS in past and through multiple moves she does not have the TENS unit from before.     Clinical Presentation  Stable    Clinical Decision Making  Low    Rehab Potential  Good    PT Frequency  One time visit    PT Treatment/Interventions  Other (comment) issuance of TENS unit   issuance of TENS unit   PT Next Visit Plan  No follow up with PT She will contact vendor as needed    Consulted and Agree with Plan of Care  Patient       Patient will benefit from skilled therapeutic intervention in order to improve the following deficits and impairments:  Pain, Increased muscle spasms, Obesity, Difficulty walking, Decreased activity tolerance, Decreased range of motion, Decreased strength  Visit Diagnosis: Chronic pain of right knee - Plan: PT plan of care cert/re-cert  Chronic pain of left knee - Plan: PT plan of care cert/re-cert  Chronic bilateral low back pain without sciatica - Plan: PT plan of care cert/re-cert  Muscle weakness (generalized) - Plan: PT plan of care cert/re-cert  Stiffness of right knee - Plan: PT plan of care cert/re-cert  Stiffness of left knee, not elsewhere classified - Plan: PT plan of care cert/re-cert  G-Codes - 03/55/97 1622    Functional Assessment Tool Used (Outpatient Only)  e        Problem List Patient Active Problem List   Diagnosis Date Noted  . Class 3 severe obesity due to excess calories with serious comorbidity and body mass index (BMI) of 50.0 to 59.9 in adult ([place]) 11/17/2016  . Dermatitis 11/17/2016  . Encounter for chronic pain management 02/11/2014  . Diabetes ([place]) 02/11/2014  . Internal and external  bleeding hemorrhoids   . Cystocele 09/14/2012  . Sarcoid 12/23/2010  . 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  . 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 Asthma 12/01/2007  . 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 OSA (obstructive sleep apnea) 10/30/2007  . Morbid (severe) obesity due to excess calories ([place]) 04/10/2007  . MYASTHENIA 03/07/2007  . SYMPTOM, INCONTINENCE, MIXED, URGE/STRESS 05/30/2006  . DEPRESSIVE DISORDER, NOS 03/24/2006  . Essential hypertension 03/24/2006  . GASTROESOPHAGEAL REFLUX, NO ESOPHAGITIS 03/24/2006    Darrel Hoover  PT 11/29/2016, 4:25 PM  [facility] [address] [place], [place], [address] Phone: [phone]   Fax:  [phone]  Name: [name] MRN: [id number] Date of Birth: [birth year] PHYSICAL THERAPY DISCHARGE SUMMARY  Visits from Start of Care: 1  Current functional level related to goals /  functional outcomes: NA   Remaining deficits:NA   Education / Equipment: Instruction on application and operation issued and pt instructed for same Plan: Patient agrees to discharge.  Patient goals were met. Patient is being discharged due to                                                     ?????  Only here for TENS unit

## 2016-12-07 ENCOUNTER — Other Ambulatory Visit: Payer: Self-pay | Admitting: Student in an Organized Health Care Education/Training Program

## 2016-12-07 DIAGNOSIS — J45909 Unspecified asthma, uncomplicated: Secondary | ICD-10-CM

## 2016-12-07 MED ORDER — SITAGLIPTIN PHOSPHATE 100 MG PO TABS: 100.0000 mg | ORAL_TABLET | Freq: Every day | ORAL | 3 refills | Status: DC

## 2016-12-07 MED ORDER — MONTELUKAST SODIUM 10 MG PO TABS: 10.0000 mg | ORAL_TABLET | Freq: Every day | ORAL | 11 refills | Status: DC

## 2016-12-31 ENCOUNTER — Other Ambulatory Visit: Payer: Self-pay | Admitting: Internal Medicine

## 2016-12-31 DIAGNOSIS — I10 Essential (primary) hypertension: Secondary | ICD-10-CM

## 2017-01-05 ENCOUNTER — Other Ambulatory Visit: Payer: Self-pay | Admitting: Student in an Organized Health Care Education/Training Program

## 2017-01-05 DIAGNOSIS — J45909 Unspecified asthma, uncomplicated: Secondary | ICD-10-CM

## 2017-01-05 MED ORDER — CETIRIZINE HCL 10 MG PO TABS: 10.0000 mg | ORAL_TABLET | Freq: Every day | ORAL | 3 refills | Status: DC

## 2017-01-05 MED ORDER — NAPROXEN 500 MG PO TABS: 500.0000 mg | ORAL_TABLET | Freq: Two times a day (BID) | ORAL | 1 refills | Status: DC

## 2017-01-05 MED ORDER — BUDESONIDE-FORMOTEROL FUMARATE 160-4.5 MCG/ACT IN AERO: 2.0000 | INHALATION_SPRAY | Freq: Two times a day (BID) | RESPIRATORY_TRACT | 3 refills | Status: DC

## 2017-01-05 MED ORDER — ALBUTEROL SULFATE HFA 108 (90 BASE) MCG/ACT IN AERS: 2.0000 | INHALATION_SPRAY | RESPIRATORY_TRACT | 3 refills | Status: DC | PRN

## 2017-01-05 NOTE — Progress Notes (Signed)
Received faxed requests and refilled albuterol, symbicort, zyrtec, naproxen.

## 2017-02-01 ENCOUNTER — Encounter: Payer: Self-pay | Admitting: Student in an Organized Health Care Education/Training Program

## 2017-02-01 ENCOUNTER — Ambulatory Visit: Payer: Medicaid Other | Admitting: Student in an Organized Health Care Education/Training Program

## 2017-02-01 ENCOUNTER — Other Ambulatory Visit: Payer: Self-pay

## 2017-02-01 VITALS — BP 122/88 | HR 102 | Temp 98.2°F | Ht 64.0 in | Wt 341.0 lb

## 2017-02-01 DIAGNOSIS — G8929 Other chronic pain: Secondary | ICD-10-CM

## 2017-02-01 DIAGNOSIS — E0810 Diabetes mellitus due to underlying condition with ketoacidosis without coma: Secondary | ICD-10-CM | POA: Diagnosis present

## 2017-02-01 LAB — POCT GLYCOSYLATED HEMOGLOBIN (HGB A1C): HEMOGLOBIN A1C: 7.8

## 2017-02-01 MED ORDER — OXYCODONE HCL 5 MG PO TABS: 5.0000 mg | ORAL_TABLET | Freq: Three times a day (TID) | ORAL | 0 refills | Status: DC | PRN

## 2017-02-01 MED ORDER — METFORMIN HCL 1000 MG PO TABS: 1000.0000 mg | ORAL_TABLET | Freq: Two times a day (BID) | ORAL | 3 refills | Status: DC

## 2017-02-01 NOTE — Patient Instructions (Signed)
It was a pleasure seeing you today in our clinic. Here is the treatment plan we have discussed and agreed upon together:  We will increase your metformin to 1000 mg twice daily. Your goal HbA1c is less than 7.0 Your test today was 8.3.  Please attempt to join the [facility] to get into the pool and exercise Continue your weight loss attempts  Your pain medications are attached to this sheet.  Schedule follow up in 3 months for diabetes. Schedule follow up sooner to discuss Depression.  Our clinic's number is [phone]. Please call with questions or concerns about what we discussed today.  Be well, Dr. Mosetta Putt

## 2017-02-01 NOTE — Progress Notes (Signed)
CC: Follow-up chronic medical issues  HPI: [name] is a 52 y.o. female with PMH significant for chronic pain, diabetes, obesity who presents to [facility] today with for follow up.  DIABETES Disease Monitoring: A1c is 7.8 from 8.3 6 months ago Blood Sugar ranges- rarely checks, intermittent, yesterday was 155   Polyuria/phagia/dipsia- +some nocturia       Visual problems- +blurry vision, +floaters  Medications: metformin 850 mg BID, Januvia 100 mg daily Compliance- unable to keep meds down about 3-4 times per week, but can keep it down with a hot tea Diet: drinking less soda, working on portion control  Hypoglycemic symptoms- denies Exercise - will try to get in at aquatic center now that its an new year  Chronic pain Patient endorses taking Oxycodone 2-3 times per day Patient endorses she will try to become more active by joining an aquatic centers. She has previously tried to join, however has been unable to afford it. Her obesity and osteoarthritis in addition to her chronic pain limit her ability to exercise.  Monitoring Labs and Parameters Last A1C:  Lab Results  Component Value Date   HGBA1C 7.8 02/01/2017   Last Lipid:     Component Value Date/Time   CHOL 178 11/27/2015 1408   HDL 40 (L) 11/27/2015 1408   Last Bmet  Potassium  Date Value Ref Range Status  11/27/2015 4.2 3.5 - 5.3 mmol/L Final   Sodium  Date Value Ref Range Status  11/27/2015 140 135 - 146 mmol/L Final   Creat  Date Value Ref Range Status  11/27/2015 0.75 0.50 - 1.10 mg/dL Final      Last BPs:  BP Readings from Last 3 Encounters:  02/01/17 122/88  11/17/16 130/90  10/31/16 (!) 147/98    Review of Symptoms:  See HPI for ROS.   CC, SH/smoking status, and VS noted.  Objective: BP 122/88   Pulse (!) 102   Temp 98.2 F (36.8 C) (Oral)   Ht 5\' 4"  (1.626 m)   Wt (!) 341 lb (154.7 kg)   SpO2 98%   BMI 58.53 kg/m  GEN: NAD, alert, cooperative, and pleasant. RESPIRATORY: clear to  auscultation bilaterally with no wheezes, rhonchi or rales, good effort CV: RRR, no m/r/g, no peripheral edema GI: soft, non-tender, non-distended, no hepatosplenomegaly SKIN: warm and dry, no rashes or lesions NEURO: II-XII grossly intact, normal gait, peripheral sensation intact PSYCH: AAOx3, appropriate affect  Assessment and plan:  Diabetes (HCC) Increase metformin to 1000 mg twice a day. Patient to call to make ophtho appointment. Continue . Encourage continued lifestyle changes Follow up in 3 months  Encounter for chronic pain management Pain medications refilled oxycodone 5 mg every 8 hours when necessary 90 pills per month with 3 scripts printed and handed to the patient for January, February, and March. The last prescription is to be filled 04/01/2017 or later. Began discussing cutting down with the patient, and only using medications as needed. Consider tapering going forward.   Orders Placed This Encounter  Procedures  . POCT glycosylated hemoglobin (Hb A1C)    Meds ordered this encounter  Medications  . metFORMIN (GLUCOPHAGE) 1000 MG tablet    Sig: Take 1 tablet (1,000 mg total) by mouth 2 (two) times daily with a meal.    Dispense:  180 tablet    Refill:  3  . DISCONTD: oxyCODONE (OXY IR/ROXICODONE) 5 MG immediate release tablet    Sig: Take 1 tablet (5 mg total) by mouth every 8 (  eight) hours as needed for severe pain.    Dispense:  90 tablet    Refill:  0    To be filled 1/8 or after  . DISCONTD: oxyCODONE (OXY IR/ROXICODONE) 5 MG immediate release tablet    Sig: Take 1 tablet (5 mg total) by mouth every 8 (eight) hours as needed for severe pain.    Dispense:  90 tablet    Refill:  0    To be filled 03/04/2017 or after  . oxyCODONE (OXY IR/ROXICODONE) 5 MG immediate release tablet    Sig: Take 1 tablet (5 mg total) by mouth every 8 (eight) hours as needed for severe pain.    Dispense:  90 tablet    Refill:  0    To be filled 04/01/2017 or after    Howard Pouch, MD,MS,  PGY2 02/08/2017 3:08 PM

## 2017-02-08 ENCOUNTER — Other Ambulatory Visit: Payer: Self-pay

## 2017-02-08 ENCOUNTER — Encounter: Payer: Self-pay | Admitting: Student in an Organized Health Care Education/Training Program

## 2017-02-08 ENCOUNTER — Ambulatory Visit: Payer: Medicaid Other | Admitting: Student in an Organized Health Care Education/Training Program

## 2017-02-08 VITALS — BP 164/96 | HR 98 | Temp 98.7°F | Ht 64.0 in | Wt 336.4 lb

## 2017-02-08 DIAGNOSIS — R059 Cough, unspecified: Secondary | ICD-10-CM

## 2017-02-08 DIAGNOSIS — J069 Acute upper respiratory infection, unspecified: Secondary | ICD-10-CM

## 2017-02-08 DIAGNOSIS — E0810 Diabetes mellitus due to underlying condition with ketoacidosis without coma: Secondary | ICD-10-CM | POA: Diagnosis not present

## 2017-02-08 DIAGNOSIS — M79642 Pain in left hand: Secondary | ICD-10-CM | POA: Diagnosis not present

## 2017-02-08 DIAGNOSIS — M79641 Pain in right hand: Secondary | ICD-10-CM | POA: Diagnosis not present

## 2017-02-08 DIAGNOSIS — R05 Cough: Secondary | ICD-10-CM | POA: Diagnosis not present

## 2017-02-08 MED ORDER — GUAIFENESIN-CODEINE 100-10 MG/5ML PO SOLN: 5.0000 mL | Freq: Every evening | ORAL | 0 refills | Status: DC | PRN

## 2017-02-08 NOTE — Progress Notes (Signed)
   CC: URI, bilateral hand fullness  HPI: [name] is a 52 y.o. female with PMH significant for diabetes, obesity, chronic pain who presents to [facility] today with URI symptoms of one weeks duration.   URI Developed symptoms one week ago with body aching, cough, rhinorrhea, congestion and difficulty sleeping due to cough. She has not had wheezing or dyspnea. She has not checked for fevers but does not think she has had fevers. She has had a sore throat which is improving. She has not required inhalers more frequently than baseline.  Bilateral hand fullness Patient reports bilateral hand fullness in each of her fingers. She has intermittent paresthesias which affect her second through fifth fingers. She has the intermittent sensation that her fingers are going to "split" from being swollen and when this is the case she feels that her hands appear more swollen. She is not experiencing these symptoms in the office today. She has not had trauma to the hand, joint pain in the hands. She feels sometimes the pain shoots from her wrists to her elbows bilaterally.  Review of Symptoms:  See HPI for ROS.   CC, SH/smoking status, and VS noted.  Objective: BP (!) 164/96   Pulse 98   Temp 98.7 F (37.1 C) (Oral)   Ht 5\' 4"  (1.626 m)   Wt (!) 336 lb 6.4 oz (152.6 kg)   SpO2 98%   BMI 57.74 kg/m  GEN: NAD, alert, cooperative, and pleasant. EYE: no conjunctival injection, pupils equally round and reactive to light ENMT: normal tympanic light reflex, no nasal polyps, +mild rhinorrhea, +mild pharyngeal erythema without exudates NECK: supple RESPIRATORY: clear to auscultation bilaterally with no wheezes, rhonchi or rales, good effort CV: RRR, no m/r/g, no peripheral edema GI: soft, non-tender, non-distended, no hepatosplenomegaly SKIN: warm and dry, no rashes or lesions MSK: Bilateral UE, strength pulse and sensation intact. No erythema, warmth or swelling appreciated. Negative phalen and tinel's  tests. Hands and fingers appear normal without disfigurement. PSYCH: AAOx3, appropriate affect   Assessment and plan:  Bilateral hand pain Uncertain etiology. We can check a B12. Diabetic neuropathy is also a consideration. There does not seem to be any acute injury to the wrists, and it would be unusual for arthritis to start in bilateral wrists at the same time. Patient walks with a walker and she bears her weight forward on the walker, This may be contributing to her symptoms. If symptoms persist we can consider XR of the hand or sending her to OT.   Viral URI No red flags or concerning symptoms. Patient does not seem to be having an asthma/COPD exacerbation. Discussed supportive care and reasons to return to care. Gave guaifenesin-codeine syrup for use 5-7 days to help with sleep with cough. Patient additionally asked me to see her son who has similar symptoms and I encouraged her to make him a separate appointment to be evaluated.   Orders Placed This Encounter  Procedures  . Vitamin B12    Meds ordered this encounter  Medications  . guaiFENesin-codeine 100-10 MG/5ML syrup    Sig: Take 5 mLs by mouth at bedtime as needed for cough (Take for 5-7 days).    Dispense:  120 mL    Refill:  0    , MD,MS,  PGY2 02/14/2017 2:49 AM

## 2017-02-08 NOTE — Assessment & Plan Note (Signed)
Pain medications refilled oxycodone 5 mg every 8 hours when necessary 90 pills per month with 3 scripts printed and handed to the patient for January, February, and March. The last prescription is to be filled 04/01/2017 or later. Began discussing cutting down with the patient, and only using medications as needed. Consider tapering going forward.

## 2017-02-08 NOTE — Assessment & Plan Note (Addendum)
Increase metformin to 1000 mg twice a day. Patient to call to make ophtho appointment. Continue [place]. Encourage continued lifestyle changes Follow up in 3 months

## 2017-02-08 NOTE — Patient Instructions (Signed)
It was a pleasure seeing you today in our clinic. Today we discussed your cold and hand symptoms. Here is the treatment plan we have discussed and agreed upon together:  We drew blood work at today's visit. I will call or send you a letter with these results. If you do not hear from me within the next week, please give our office a call.   Please only use the cough medicine for the next 5-7 days at bed time. Do not drive after taking.  Our clinic's number is [phone]. Please call with questions or concerns about what we discussed today.  Be well, Dr. Mosetta Putt

## 2017-02-09 LAB — VITAMIN B12: Vitamin B-12: 624 pg/mL (ref 232–1245)

## 2017-02-10 ENCOUNTER — Telehealth: Payer: Self-pay

## 2017-02-10 NOTE — Telephone Encounter (Signed)
Pt informed. Veda Arrellano T Lillianna Sabel, CMA  

## 2017-02-10 NOTE — Telephone Encounter (Signed)
-----   Message from Howard Pouch, MD sent at 02/10/2017  2:54 PM EST ----- Please call and let the patient that her B12 level was normal when levels were drawn at her visit this week.  Thank you.  ----- Message ----- From: Nell Range Lab Results In Sent: 02/09/2017   8:26 AM To: Howard Pouch, MD

## 2017-02-14 ENCOUNTER — Encounter: Payer: Self-pay | Admitting: Student in an Organized Health Care Education/Training Program

## 2017-02-14 DIAGNOSIS — J069 Acute upper respiratory infection, unspecified: Secondary | ICD-10-CM | POA: Insufficient documentation

## 2017-02-14 DIAGNOSIS — M79641 Pain in right hand: Secondary | ICD-10-CM | POA: Insufficient documentation

## 2017-02-14 DIAGNOSIS — M79642 Pain in left hand: Secondary | ICD-10-CM

## 2017-02-14 NOTE — Assessment & Plan Note (Signed)
No red flags or concerning symptoms. Patient does not seem to be having an asthma/COPD exacerbation. Discussed supportive care and reasons to return to care. Gave guaifenesin-codeine syrup for use 5-7 days to help with sleep with cough. Patient additionally asked me to see her son who has similar symptoms and I encouraged her to make him a separate appointment to be evaluated.

## 2017-02-14 NOTE — Assessment & Plan Note (Signed)
Uncertain etiology. We can check a B12. Diabetic neuropathy is also a consideration. There does not seem to be any acute injury to the wrists, and it would be unusual for arthritis to start in bilateral wrists at the same time. Patient walks with a walker and she bears her weight forward on the walker, This may be contributing to her symptoms. If symptoms persist we can consider XR of the hand or sending her to OT.

## 2017-02-27 ENCOUNTER — Ambulatory Visit (HOSPITAL_COMMUNITY)
Admission: EM | Admit: 2017-02-27 | Discharge: 2017-02-27 | Disposition: A | Discharge status: Home / Self Care | Payer: Medicaid Other | Attending: Family Medicine | Admitting: Family Medicine

## 2017-02-27 ENCOUNTER — Encounter (HOSPITAL_COMMUNITY): Payer: Self-pay | Admitting: Emergency Medicine

## 2017-02-27 DIAGNOSIS — M79672 Pain in left foot: Secondary | ICD-10-CM | POA: Diagnosis not present

## 2017-02-27 NOTE — ED Provider Notes (Signed)
[facility]   [id number] 02/27/17 Arrival Time: 1946   SUBJECTIVE:  [name] is a 52 y.o. female who presents to the urgent care with complaint of  foot problem in the context of 2+ years of diabetes on bottom of left foot onset 1 week  Pt does not check blood sugars but reports last A1cC = 7.4%  Past Medical History:  Diagnosis Date  . ALLERGIC RHINITIS    takes Zyrtec daily  . Anxiety   . Arthritis   . Asthma    Albuterol prn;Symbicort daily and Singulair daily  . Bronchitis    hx of  . CAP (community acquired pneumonia) 02/20/2015  . Carpal tunnel syndrome    right  . Chronic back pain   . Constipation    Miralax prn  . Depression    takes Cymbalta daily  . Diabetes (HCC)   . Dizziness    pt states she gets off balance occasionally  . Eczema   . Gallstones 2010  . GERD (gastroesophageal reflux disease)    takes Omeprazole daily  . Headache(784.0)    couple of times a week  . Hemorrhoids   . Hypertension    takes Prinizide daily and Clonidine on Mondays  . Hypoventilation associated with obesity syndrome (HCC)   . Insomnia   . Irritable bladder   . Joint pain   . Joint swelling   . Lung disease    scleraderma  . Myasthenia gravis 1994   Positive Acetylcholine receptor Ab and single fiber EMG Dr. Clarisa [facility] 1996- Dr. Noreene Filbert 1998, Dr Sharene Skeans 2008 [facility]- Failed prednisone due to weight gain, stopped imuran/mestinon due to finances- Now with quiescent disease per Dr Sharene Skeans and no flares in many years  . Myasthenia gravis   . Nocturia   . OSA (obstructive sleep apnea)    RDI 10 on PSG 10/09  . Osteoarthritis   . Pelvic inflammatory disease (PID)   . Peptic ulcer   . Pneumonia    hx of;last time about 1-63yrs ago  . 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 Coralyn Helling- 10/30/2007 ANA positive, ANA titer  negative, RF less than 20  . Rheumatoid arthritis(714.0)   . Shortness of breath     can be sitting as well as exertion  . Skin irritation    skin itchy  . Trigger finger   . Urinary frequency    takes Ditropan daily  . Urinary urgency   . Viral meningitis    history of viral meningitis   Family History  Problem Relation Age of Onset  . Hypertension Father   . Sickle cell trait Father   . Diabetes Father        Borderline  . Stroke Paternal Grandmother   . Sickle cell trait Paternal Grandfather   . Diabetes Paternal Grandfather   . Lupus Other        Mother's first cousin  . Crohn's disease Paternal Aunt   . Diabetes Maternal Grandmother   . Anesthesia problems Neg Hx   . Hypotension Neg Hx   . Malignant hyperthermia Neg Hx   . Pseudochol deficiency Neg Hx   . Colon cancer Neg Hx   . Colon polyps Neg Hx   . Heart disease Neg Hx   . Kidney disease Neg Hx   . Esophageal cancer Neg Hx   . Gallbladder disease Neg Hx    Social History   Socioeconomic History  .  Marital status: Married    Spouse name: Not on file  . Number of children: 4  . Years of education: Not on file  . Highest education level: Not on file  Social Needs  . Financial resource strain: Not on file  . Food insecurity - worry: Not on file  . Food insecurity - inability: Not on file  . Transportation needs - medical: Not on file  . Transportation needs - non-medical: Not on file  Occupational History  . Occupation: Disabled  Tobacco Use  . Smoking status: Never Smoker  . Smokeless tobacco: Never Used  Substance and Sexual Activity  . Alcohol use: No  . Drug use: No  . Sexual activity: Yes    Birth control/protection: Injection  Other Topics Concern  . Not on file  Social History Narrative   Lives in a house with husband (s/p incarceration), daughter, 2 sons, and grandson, unemployed, unemployed, no pets, no tob, no etoh, no exercise, no healthy diet, more than or equal to 12 yrs education, christian, has motorized scooter that she uses periodically, husband smokes in the house    Current Meds  Medication Sig  . albuterol (PROAIR HFA) 108 (90 Base) MCG/ACT inhaler Inhale 2 puffs into the lungs every 4 (four) hours as needed.  . budesonide-formoterol (SYMBICORT) 160-4.5 MCG/ACT inhaler Inhale 2 puffs into the lungs 2 (two) times daily.  . cetirizine (ZYRTEC) 10 MG tablet Take 1 tablet (10 mg total) by mouth daily.  . cloNIDine (CATAPRES) 0.1 MG tablet TAKE 1 TABLET (0.1 MG TOTAL) BY MOUTH 3 (THREE) TIMES DAILY. ONLY TO BE TAKEN IF PATCH FALLS OFF  . cloNIDine (CATAPRES-TTS-3) 0.3 mg/24hr patch PLACE 1 PATCH (0.3 MG TOTAL) ONTO THE SKIN ONCE A WEEK.  Marland Kitchen FLUoxetine (PROZAC) 40 MG capsule Take 1 capsule (40 mg total) by mouth daily.  . fluticasone (FLONASE) 50 MCG/ACT nasal spray Place 2 sprays into both nostrils daily. (Patient taking differently: Place 2 sprays into both nostrils daily as needed. )  . furosemide (LASIX) 20 MG tablet Take 20 mg by mouth daily as needed.  Marland Kitchen guaiFENesin-codeine 100-10 MG/5ML syrup Take 5 mLs by mouth at bedtime as needed for cough (Take for 5-7 days).  . hydrocortisone ointment 0.5 % Apply 1 application topically 2 (two) times daily.  Marland Kitchen losartan-hydrochlorothiazide (HYZAAR) 100-25 MG tablet TAKE 1 TABLET BY MOUTH EVERY DAY  . metFORMIN (GLUCOPHAGE) 1000 MG tablet Take 1 tablet (1,000 mg total) by mouth 2 (two) times daily with a meal.  . montelukast (SINGULAIR) 10 MG tablet Take 1 tablet (10 mg total) at bedtime by mouth.  . naproxen (NAPROSYN) 500 MG tablet Take 1 tablet (500 mg total) by mouth 2 (two) times daily with a meal.  . omeprazole (PRILOSEC) 40 MG capsule Take 1 capsule (40 mg total) by mouth daily.  Marland Kitchen oxybutynin (DITROPAN) 5 MG tablet Take 1 tablet (5 mg total) by mouth 2 (two) times daily.  . sitaGLIPtin (JANUVIA) 100 MG tablet Take 1 tablet (100 mg total) daily by mouth.  . sulindac (CLINORIL) 200 MG tablet Take 1 tablet (200 mg total) by mouth 2 (two) times daily as needed.  . [DISCONTINUED] doxycycline (VIBRAMYCIN) 100 MG  capsule Take 1 capsule (100 mg total) by mouth 2 (two) times daily.   Allergies  Allergen Reactions  . Apple Other (See Comments)    "mouth itch" - lumps on tongue  . Banana Other (See Comments)    "whelps on tongue"  . Shrimp [Shellfish Allergy] Nausea  And Vomiting and Swelling      ROS: As per HPI, remainder of ROS negative.   OBJECTIVE:   Vitals:   02/27/17 2008  BP: 126/80  Pulse: 91  Resp: 20  Temp: 98.4 F (36.9 C)  TempSrc: Oral  SpO2: 97%     General appearance: alert; no distress Eyes: PERRL; EOMI; conjunctiva normal HENT: normocephalic; atraumatic;oral mucosa normal Neck: supple Back: no CVA tenderness Extremities: no cyanosis or edema; symmetrical with no gross deformities;  Hyperpigmented sole of left foot with mild induration and tenderness Skin: warm and dry Neurologic: normal gait; grossly normal Psychological: alert and cooperative; normal mood and affect      Labs:  Results for orders placed or performed in visit on 02/08/17  Vitamin B12  Result Value Ref Range   Vitamin B-12 624 232 - 1,245 pg/mL    Labs Reviewed - No data to display  No results found.     ASSESSMENT & PLAN:  1. Pain in left foot    This appears to be a bruised foot which is resolving.  Reviewed expectations re: course of current medical issues. Questions answered. Outlined signs and symptoms indicating need for more acute intervention. Patient verbalized understanding. After Visit Summary given.    Procedures:      Elvina Sidle, MD 02/27/17 2032

## 2017-02-27 NOTE — Discharge Instructions (Signed)
This appears to be a bruised foot which is healing.

## 2017-02-27 NOTE — ED Triage Notes (Signed)
PT C/O: reports possible diabetic ulcer on bottom of left foot onset 1 week  Pt does not check blood sugars but reports last A1cC = 7.4%   A&O x4... NAD... Brought back on wheel chair.

## 2017-03-01 ENCOUNTER — Other Ambulatory Visit (INDEPENDENT_AMBULATORY_CARE_PROVIDER_SITE_OTHER): Payer: Self-pay | Admitting: Licensed Clinical Social Worker

## 2017-03-01 DIAGNOSIS — F439 Reaction to severe stress, unspecified: Secondary | ICD-10-CM

## 2017-03-03 ENCOUNTER — Encounter (INDEPENDENT_AMBULATORY_CARE_PROVIDER_SITE_OTHER): Payer: Medicaid Other

## 2017-03-08 ENCOUNTER — Ambulatory Visit (INDEPENDENT_AMBULATORY_CARE_PROVIDER_SITE_OTHER): Payer: Medicaid Other | Admitting: Family Medicine

## 2017-03-08 NOTE — Progress Notes (Signed)
LCSW met with [name] in her home in order to assess her case management/health needs. [name] shared that she is struggling with multiple chronic health needs and is wondering about the possibility of a home health aide. She stated that she also needs support advocating with her landlord about getting a step to her back door so that she can get in her apartment more easily. She shared that she would like counseling services as well to address her stress. She shared about the stress of her recent move within the apartment complex. [name] reported that she lives with her adult daughter and 40 year old son. She reported that her 53 year old son is in [place] for a mental health assessment but may be coming home soon. She requested LCSW help in faxing a document to her Chief Strategy Officer.

## 2017-03-10 ENCOUNTER — Ambulatory Visit (INDEPENDENT_AMBULATORY_CARE_PROVIDER_SITE_OTHER): Payer: Medicaid Other | Admitting: Family Medicine

## 2017-03-10 ENCOUNTER — Encounter (INDEPENDENT_AMBULATORY_CARE_PROVIDER_SITE_OTHER): Payer: Self-pay

## 2017-03-10 ENCOUNTER — Other Ambulatory Visit: Payer: Self-pay | Admitting: Student in an Organized Health Care Education/Training Program

## 2017-03-11 ENCOUNTER — Telehealth: Payer: Self-pay

## 2017-03-11 ENCOUNTER — Other Ambulatory Visit: Payer: Self-pay

## 2017-03-11 DIAGNOSIS — I10 Essential (primary) hypertension: Secondary | ICD-10-CM

## 2017-03-11 DIAGNOSIS — J45909 Unspecified asthma, uncomplicated: Secondary | ICD-10-CM

## 2017-03-11 NOTE — Telephone Encounter (Signed)
[facility] of O[facility]codone. Form placed in MD's box for completion along with Medicaid formulary.  Ples Specter, RN [facility] RN)

## 2017-03-14 ENCOUNTER — Other Ambulatory Visit: Payer: Self-pay

## 2017-03-14 ENCOUNTER — Telehealth: Payer: Self-pay | Admitting: Student in an Organized Health Care Education/Training Program

## 2017-03-14 MED ORDER — BUDESONIDE-FORMOTEROL FUMARATE 160-4.5 MCG/ACT IN AERO: 2.0000 | INHALATION_SPRAY | Freq: Two times a day (BID) | RESPIRATORY_TRACT | 3 refills | Status: DC

## 2017-03-14 MED ORDER — CLONIDINE HCL 0.1 MG PO TABS: ORAL_TABLET | ORAL | 0 refills | Status: DC

## 2017-03-14 NOTE — Telephone Encounter (Signed)
Disability parking placard form dropped off for at front desk for completion.  Verified that patient section of form has been completed.  Last DOS with PCP was 02/08/17.  Placed form in white team folder to be completed by clinical staff.  Lina Sar

## 2017-03-14 NOTE — Telephone Encounter (Signed)
Patient request call from pcp in regards to her referral to an ortho doctor.  [phone]

## 2017-03-15 ENCOUNTER — Other Ambulatory Visit: Payer: Self-pay | Admitting: Licensed Clinical Social Worker

## 2017-03-15 NOTE — Telephone Encounter (Signed)
Form placed in PCP box, appears that pt has already checked some of the boxes in the medical provider section. [name], [name], [place]

## 2017-03-17 ENCOUNTER — Other Ambulatory Visit: Payer: Self-pay | Admitting: Student in an Organized Health Care Education/Training Program

## 2017-03-17 DIAGNOSIS — M25532 Pain in left wrist: Secondary | ICD-10-CM

## 2017-03-17 DIAGNOSIS — M179 Osteoarthritis of knee, unspecified: Secondary | ICD-10-CM

## 2017-03-17 DIAGNOSIS — M1710 Unilateral primary osteoarthritis, unspecified knee: Secondary | ICD-10-CM

## 2017-03-17 NOTE — Telephone Encounter (Signed)
Form completed and placed in RN box

## 2017-03-17 NOTE — Telephone Encounter (Signed)
Spoke with patient. Ortho referral placed.

## 2017-03-18 NOTE — Telephone Encounter (Signed)
Completed PA info in [facility] Tracks for Oxycodone IR.  Status pending. Will recheck status in 24 hours. Ples Specter, RN [facility] RN)

## 2017-03-18 NOTE — Telephone Encounter (Signed)
Left message for patient that form available for pickup. Placed at front desk. Copy made for scanning. Ples Specter, RN [facility] RN)

## 2017-03-22 NOTE — Telephone Encounter (Signed)
Prior approval for Oxycodone IR completed via [place] Tracks. Med approved for 03/18/17 - 09/14/17. Prior approval # [id number]. [facility]. Ples Specter, RN [facility] RN)

## 2017-04-29 ENCOUNTER — Other Ambulatory Visit: Payer: Self-pay | Admitting: Licensed Clinical Social Worker

## 2017-04-29 DIAGNOSIS — F32A Depression, unspecified: Secondary | ICD-10-CM

## 2017-04-29 DIAGNOSIS — F329 Major depressive disorder, single episode, unspecified: Secondary | ICD-10-CM

## 2017-05-02 ENCOUNTER — Telehealth: Payer: Self-pay

## 2017-05-02 NOTE — Telephone Encounter (Signed)
Received fax refill request for Catapres patch. Ples Specter, RN [facility] RN)
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
## 2017-05-05 ENCOUNTER — Other Ambulatory Visit: Payer: Self-pay | Admitting: Licensed Clinical Social Worker

## 2017-05-05 DIAGNOSIS — F329 Major depressive disorder, single episode, unspecified: Secondary | ICD-10-CM

## 2017-05-05 DIAGNOSIS — F32A Depression, unspecified: Secondary | ICD-10-CM
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
## 2017-05-16 ENCOUNTER — Other Ambulatory Visit: Payer: Self-pay

## 2017-05-17 ENCOUNTER — Other Ambulatory Visit: Payer: Self-pay

## 2017-05-23 ENCOUNTER — Other Ambulatory Visit: Payer: Self-pay

## 2017-05-23 DIAGNOSIS — J45909 Unspecified asthma, uncomplicated: Secondary | ICD-10-CM

## 2017-05-24 ENCOUNTER — Other Ambulatory Visit: Payer: Self-pay

## 2017-05-24 MED ORDER — FLUOXETINE HCL 40 MG PO CAPS: 40.0000 mg | ORAL_CAPSULE | Freq: Every day | ORAL | 11 refills | Status: DC

## 2017-05-24 MED ORDER — BUDESONIDE-FORMOTEROL FUMARATE 160-4.5 MCG/ACT IN AERO: 2.0000 | INHALATION_SPRAY | Freq: Two times a day (BID) | RESPIRATORY_TRACT | 3 refills | Status: DC

## 2017-05-26 ENCOUNTER — Other Ambulatory Visit: Payer: Self-pay | Admitting: Licensed Clinical Social Worker

## 2017-05-26 DIAGNOSIS — F329 Major depressive disorder, single episode, unspecified: Secondary | ICD-10-CM

## 2017-05-26 DIAGNOSIS — F32A Depression, unspecified: Secondary | ICD-10-CM

## 2017-05-27 ENCOUNTER — Telehealth: Payer: Self-pay | Admitting: Student in an Organized Health Care Education/Training Program

## 2017-05-27 NOTE — Telephone Encounter (Signed)
Needs refills on clondine patch and pain med. [facility] [place]

## 2017-05-30 ENCOUNTER — Other Ambulatory Visit: Payer: Self-pay

## 2017-05-30 ENCOUNTER — Other Ambulatory Visit: Payer: Self-pay | Admitting: Student in an Organized Health Care Education/Training Program

## 2017-05-30 DIAGNOSIS — I10 Essential (primary) hypertension: Secondary | ICD-10-CM

## 2017-05-31 ENCOUNTER — Other Ambulatory Visit: Payer: Self-pay | Admitting: Student in an Organized Health Care Education/Training Program

## 2017-05-31 ENCOUNTER — Encounter: Payer: Self-pay | Admitting: Student in an Organized Health Care Education/Training Program

## 2017-05-31 DIAGNOSIS — G8929 Other chronic pain: Secondary | ICD-10-CM

## 2017-05-31 DIAGNOSIS — I159 Secondary hypertension, unspecified: Secondary | ICD-10-CM

## 2017-05-31 MED ORDER — OXYCODONE HCL 5 MG PO TABS: 5.0000 mg | ORAL_TABLET | Freq: Three times a day (TID) | ORAL | 0 refills | Status: DC | PRN

## 2017-05-31 MED ORDER — CLONIDINE HCL 0.3 MG/24HR TD PTWK: 0.3000 mg | MEDICATED_PATCH | TRANSDERMAL | 3 refills | Status: DC

## 2017-05-31 NOTE — Telephone Encounter (Signed)
Called and spoke with patient.  Requested refills were sent in.  She reports that she had a fall on Tuesday which she thinks was due to weakness.  Her next appointment is not until the end of the month.  We were able to get her in sooner to be seen in our clinic on Friday.  Red flags/return precautions were discussed.

## 2017-05-31 NOTE — Progress Notes (Signed)
   THERAPY PROGRESS NOTE  Session Time:  Participation Level: Active  Behavioral Response: Disheveled and Fairly GroomedDrowsyDepressed  Type of Therapy: Individual Therapy  Treatment Goals addressed: Coping  Interventions: Motivational Interviewing and Supportive  Summary: [name] is a 51 y.o. female who presents with a depressed mood and appropriate affect. She reported that she has not been feeling well. She shared that her older son returned home from a residential facility for a short visit and that it was very nice but that it stirred up a lot of emotions for her. She shared about the stress she experiences when her children make bad decisions. [name] expressed concerns about potential inspections at her apartment complex and whether she could be evicted; she shared that she is not physically able to do the work necessary to clean the apartment and nor is her adult daughter. She stated that she is willing to get rid of things to simplify but that she needs help cleaning and organizing. [name] shared that she imagines a future where she will be pain free or have less pain. She acknowledged that she needs to lose weight to help with this process but that it is very difficult. She reported that she has very little appetite so at times she forces herself to eat high calorie snack food. She stated that counseling has made her feel more positive.   Suicidal/Homicidal: Nowithout intent/plan  Therapist Response: LCSW utilized supportive counseling techniques throughout the session in order to validate emotions and encourage open expression of emotion. LCSW and [name] brainstormed for solutions about how to keep her apartment cleaner to pass inspections. LCSW inquired about her motivation level to lose weight. LCSW and [name] explored what might change in her life if she was able to lose weight.  Plan: Return again in 2 weeks.    [name], LCSW 05/31/2017

## 2017-06-03 ENCOUNTER — Encounter: Payer: Self-pay | Admitting: Licensed Clinical Social Worker

## 2017-06-03 ENCOUNTER — Other Ambulatory Visit: Payer: Self-pay

## 2017-06-03 ENCOUNTER — Encounter: Payer: Self-pay | Admitting: Student in an Organized Health Care Education/Training Program

## 2017-06-03 ENCOUNTER — Ambulatory Visit: Payer: Medicaid Other | Admitting: Student in an Organized Health Care Education/Training Program

## 2017-06-03 VITALS — BP 130/78 | HR 115 | Temp 98.9°F | Ht 64.0 in | Wt 333.0 lb

## 2017-06-03 DIAGNOSIS — G8929 Other chronic pain: Secondary | ICD-10-CM

## 2017-06-03 DIAGNOSIS — E0800 Diabetes mellitus due to underlying condition with hyperosmolarity without nonketotic hyperglycemic-hyperosmolar coma (NKHHC): Secondary | ICD-10-CM | POA: Diagnosis not present

## 2017-06-03 DIAGNOSIS — W19XXXA Unspecified fall, initial encounter: Secondary | ICD-10-CM

## 2017-06-03 DIAGNOSIS — K0889 Other specified disorders of teeth and supporting structures: Secondary | ICD-10-CM

## 2017-06-03 LAB — POCT GLYCOSYLATED HEMOGLOBIN (HGB A1C): HEMOGLOBIN A1C: 8.3

## 2017-06-03 MED ORDER — PENICILLIN V POTASSIUM 500 MG PO TABS: 500.0000 mg | ORAL_TABLET | Freq: Four times a day (QID) | ORAL | 0 refills | Status: DC

## 2017-06-03 NOTE — Progress Notes (Signed)
Type of Service: Clinical Social Work  Social work consult from Dr. Mosetta Putt reference assistance with Personal Care Services referral.   LCSW completed top of PCS form, highlighted area for PCP to complete and placed in provider's mailbox .   Provided patient with information on PCS and Next Steps form.  Sammuel Hines, LCSW Licensed Clinical Social Worker [facility]   [phone] 11:44 AM

## 2017-06-03 NOTE — Assessment & Plan Note (Signed)
Discussed strategies for weaning pain meds at today's visit. Addressed obesity and weight loss as one approach. Additionally, discussed cutting down pain meds by 5% per month (could even just start with 2-3 pills less per month) and trying to space medications. She was very resistant to this idea, and feels she is dependent on these medications to remain functional. Meds were already refilled for this month. We will have to continue to discuss this further at her next visit.

## 2017-06-03 NOTE — Assessment & Plan Note (Signed)
Information for bariatric surgery sessions provided at today's visit. Patient is open to this possibility especially given her chronic pain secondary to weight. She additionally is attempting to get back into the pool for exercise. She feels a home health aide would assist her in this goal. Spoke with social work. Referral was placed for home health aide at today's visit to see if this is a resource the patient qualifies for.

## 2017-06-03 NOTE — Patient Instructions (Addendum)
It was a pleasure seeing you today in our clinic.   Our clinic's number is [phone]. Please call with questions or concerns about what we discussed today.  Be well, Dr. Dehaven Sine   
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
## 2017-06-03 NOTE — Assessment & Plan Note (Signed)
-   No evidence of infection on exam today, no fevers or face swelling appreciated - list of medicaid dentists provided - prescribed abx to get patient to dentist, informed her this was just a bridge and she definitely needs a dentist visit to address this issue. She is agreeable to this plan. - penicillin v potassium (VEETID) 500 MG tablet; Take 1 tablet (500 mg total) by mouth 4 (four) times daily.  Dispense: 20 tablet; Refill: 0
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
## 2017-06-07 ENCOUNTER — Other Ambulatory Visit: Payer: Self-pay

## 2017-06-10 ENCOUNTER — Other Ambulatory Visit: Payer: Self-pay | Admitting: Licensed Clinical Social Worker

## 2017-06-10 DIAGNOSIS — F32A Depression, unspecified: Secondary | ICD-10-CM

## 2017-06-10 DIAGNOSIS — F329 Major depressive disorder, single episode, unspecified: Secondary | ICD-10-CM

## 2017-06-14 ENCOUNTER — Encounter: Payer: Self-pay | Admitting: Student in an Organized Health Care Education/Training Program

## 2017-06-14 ENCOUNTER — Ambulatory Visit: Payer: Medicaid Other | Admitting: Student in an Organized Health Care Education/Training Program

## 2017-06-14 ENCOUNTER — Other Ambulatory Visit: Payer: Self-pay

## 2017-06-14 DIAGNOSIS — G8929 Other chronic pain: Secondary | ICD-10-CM

## 2017-06-14 MED ORDER — SULINDAC 200 MG PO TABS: 200.0000 mg | ORAL_TABLET | Freq: Two times a day (BID) | ORAL | 0 refills | Status: DC | PRN

## 2017-06-14 MED ORDER — ETODOLAC 300 MG PO CAPS: 300.0000 mg | ORAL_CAPSULE | Freq: Two times a day (BID) | ORAL | 0 refills | Status: DC | PRN

## 2017-06-14 MED ORDER — DICLOFENAC SODIUM 1 % TD GEL: 2.0000 g | Freq: Four times a day (QID) | TRANSDERMAL | 0 refills | Status: DC | PRN

## 2017-06-14 MED ORDER — OXYCODONE HCL 5 MG PO TABS: 5.0000 mg | ORAL_TABLET | Freq: Three times a day (TID) | ORAL | 0 refills | Status: DC | PRN

## 2017-06-14 NOTE — Progress Notes (Signed)
Subjective:    [name] - 52 y.o. female MRN [id number]  Date of birth: 06/18/1965  HPI  [name] is here for chronic pain follow up.  Chronic pain management: Patient is managed for chronic pain due to bilateral knee osteoarthritis.  She is chronically been managed on 5 mg of oxycodone 3 times daily with 90 pills given each month.  However last month she had a fall after feeling lightheaded which was concerning for possible side effects from opioid medication use.  Discussed decreasing the number of pills given so that she can space out her medication and slowly wean the oxycodone.  Patient is not very agreeable to this.  She states that she is only taking the medication about twice daily, however she is using 90 pills/month so I suspect that she is requiring IV dosing more frequently than she estimates. Shared decision-making, patient is agreeable to decreasing to 66 pills/month with a slow wean of 3 pills fewer each refill.  Additionally noted the patient is on multiple NSAIDs including naproxen and sulindac.  Discussed risk of cardiac event with chronic NSAID use as well as GI and renal effects of chronic NSAIDs.  Patient is agreeable to stopping the naproxen.  She states that she previously was prescribed etodolac which was an NSAID that worked well for her. Additional approaches to managing osteoarthritis include weight reduction.  Patient is scheduled for bariatric surgery information session and additionally we have consulted home health services to see if we can get her an aide which she feels will allow her to go to the pool and exercise more frequently.  -  reports that she has never smoked. She has never used smokeless tobacco. - Review of Systems: Per HPI. - Past Medical History: Patient Active Problem List   Diagnosis Date Noted  . Encounter for chronic pain management 02/11/2014  . Diabetes (HCC) 02/11/2014  . Cystocele 09/14/2012  . Sarcoid 12/23/2010  . DJD  (degenerative joint disease) of knee 05/28/2010  . 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 Asthma 12/01/2007  . 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 OSA (obstructive sleep apnea) 10/30/2007  . Morbid (severe) obesity due to excess calories (HCC) 04/10/2007  . MYASTHENIA 03/07/2007  . SYMPTOM, INCONTINENCE, MIXED, URGE/STRESS 05/30/2006  . DEPRESSIVE DISORDER, NOS 03/24/2006  . Essential hypertension 03/24/2006  . GASTROESOPHAGEAL REFLUX, NO ESOPHAGITIS 03/24/2006   - Medications: reviewed and updated Current Outpatient Medications  Medication Sig Dispense Refill  . albuterol (PROAIR HFA) 108 (90 Base) MCG/ACT inhaler Inhale 2 puffs into the lungs every 4 (four) hours as needed. 8.5 Inhaler 3  . budesonide-formoterol (SYMBICORT) 160-4.5 MCG/ACT inhaler Inhale 2 puffs into the lungs 2 (two) times daily. 1 Inhaler 3  . cetirizine (ZYRTEC) 10 MG tablet Take 1 tablet (10 mg total) by mouth daily. 90 tablet 3  . cloNIDine (CATAPRES - DOSED IN MG/24 HR) 0.3 mg/24hr patch Place 1 patch (0.3 mg total) onto the skin once a week. 4 patch 3  . cloNIDine (CATAPRES) 0.1 MG tablet TAKE 1 TABLET (0.1 MG TOTAL) BY MOUTH 3 (THREE) TIMES DAILY. ONLY TO BE TAKEN IF PATCH FALLS OFF 30 tablet 0  . diclofenac sodium (VOLTAREN) 1 % GEL Apply 2 g topically 4 (four) times daily as needed (knee pain). 100 g 0  . etodolac (LODINE) 300 MG capsule Take 1 capsule (300 mg total) by mouth 2 (two) times daily as needed. 60 capsule 0  . FLUoxetine (PROZAC) 40 MG capsule Take 1  capsule (40 mg total) by mouth daily. 30 capsule 11  . fluticasone (FLONASE) 50 MCG/ACT nasal spray Place 2 sprays into both nostrils daily. (Patient taking differently: Place 2 sprays into both nostrils daily as needed. ) 16 g 6  . furosemide (LASIX) 20 MG tablet Take 20 mg by mouth daily as needed.    Marland Kitchen guaiFENesin-codeine 100-10 MG/5ML syrup Take 5 mLs by mouth at bedtime as needed for cough (Take for 5-7 days). 120 mL 0  .  losartan-hydrochlorothiazide (HYZAAR) 100-25 MG tablet TAKE 1 TABLET BY MOUTH EVERY DAY 30 tablet 2  . metFORMIN (GLUCOPHAGE) 1000 MG tablet Take 1 tablet (1,000 mg total) by mouth 2 (two) times daily with a meal. 180 tablet 3  . montelukast (SINGULAIR) 10 MG tablet Take 1 tablet (10 mg total) at bedtime by mouth. 30 tablet 11  . omeprazole (PRILOSEC) 40 MG capsule Take 1 capsule (40 mg total) by mouth daily. 60 capsule 11  . oxybutynin (DITROPAN) 5 MG tablet TAKE 1 TABLET BY MOUTH TWICE A DAY 60 tablet 3  . oxyCODONE (OXY IR/ROXICODONE) 5 MG immediate release tablet Take 1 tablet (5 mg total) by mouth every 8 (eight) hours as needed for severe pain. 87 tablet 0  . polyethylene glycol (MIRALAX / GLYCOLAX) packet Take 17 g by mouth daily as needed for mild constipation.    . sitaGLIPtin (JANUVIA) 100 MG tablet Take 1 tablet (100 mg total) daily by mouth. 90 tablet 3   No current facility-administered medications for this visit.     Review of Systems See HPI     Objective:   Physical Exam BP (!) 144/82   Pulse 93   Temp 98.3 F (36.8 C) (Oral)   Wt (!) 339 lb (153.8 kg)   SpO2 98%   BMI 58.19 kg/m  Gen: NAD, alert, cooperative with exam, well-appearing  CV: RRR, well perfused Resp: non labored, comfortable Skin: no rashes, normal turgor  Neuro: no gross deficits.  Psych: good insight, alert and oriented     Assessment & Plan:   Encounter for chronic pain management OA bilateral knees with obesity.  - keep etoldolac (this works best for her) - STOP naproxen (patient was on two NSAIDS) - check BMP for renal fxn with chronic NSAID use - decrease 5 mg oxy #90 per month to #87 per month >>> at next med check in can decrease by #3 pills again, slow wean - add voltaren gel - continue weight loss efforts - patient reports she is scheduled for bariatric consultation - Refilled meds with printed scripts to be filled 6/7, 7/7 next refill #84 tablets to be filled 08/31/17    Orders  Placed This Encounter  Procedures  . Basic metabolic panel    Meds ordered this encounter  Medications  . DISCONTD: oxyCODONE (OXY IR/ROXICODONE) 5 MG immediate release tablet    Sig: Take 1 tablet (5 mg total) by mouth every 8 (eight) hours as needed for severe pain.    Dispense:  87 tablet    Refill:  0    To be filled 07/01/2017 or later  . oxyCODONE (OXY IR/ROXICODONE) 5 MG immediate release tablet    Sig: Take 1 tablet (5 mg total) by mouth every 8 (eight) hours as needed for severe pain.    Dispense:  87 tablet    Refill:  0    To be filled 07/31/2017 or later  . diclofenac sodium (VOLTAREN) 1 % GEL    Sig: Apply 2 g  topically 4 (four) times daily as needed (knee pain).    Dispense:  100 g    Refill:  0  . DISCONTD: sulindac (CLINORIL) 200 MG tablet    Sig: Take 1 tablet (200 mg total) by mouth 2 (two) times daily as needed.    Dispense:  180 tablet    Refill:  0  . etodolac (LODINE) 300 MG capsule    Sig: Take 1 capsule (300 mg total) by mouth 2 (two) times daily as needed.    Dispense:  60 capsule    Refill:  0    Howard Pouch, MD,MS,  PGY2 06/14/2017 3:09 PM

## 2017-06-14 NOTE — Assessment & Plan Note (Addendum)
OA bilateral knees with obesity.  - keep etoldolac (this works best for her) - STOP naproxen (patient was on two NSAIDS) - check BMP for renal fxn with chronic NSAID use - decrease 5 mg oxy #90 per month to #87 per month >>> at next med check in can decrease by #3 pills again, slow wean - add voltaren gel - continue weight loss efforts - patient reports she is scheduled for bariatric consultation - Refilled meds with printed scripts to be filled 6/7, 7/7 next refill #84 tablets to be filled 08/31/17
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
## 2017-06-14 NOTE — Patient Instructions (Signed)
It was a pleasure seeing you today in our clinic.   Our clinic's number is [phone]. Please call with questions or concerns about what we discussed today.  Be well, Dr. Anaily Ashbaugh   

## 2017-06-15 LAB — BASIC METABOLIC PANEL
BUN/Creatinine Ratio: 17 (ref 9–23)
BUN: 13 mg/dL (ref 6–24)
CALCIUM: 9.7 mg/dL (ref 8.7–10.2)
CHLORIDE: 100 mmol/L (ref 96–106)
CO2: 22 mmol/L (ref 20–29)
CREATININE: 0.78 mg/dL (ref 0.57–1.00)
GFR calc Af Amer: 102 mL/min/{1.73_m2} (ref >59)
GFR calc non Af Amer: 88 mL/min/{1.73_m2} (ref >59)
Glucose: 124 mg/dL — ABNORMAL HIGH (ref 65–99)
Potassium: 4.3 mmol/L (ref 3.5–5.2)
Sodium: 141 mmol/L (ref 134–144)

## 2017-06-16 ENCOUNTER — Telehealth: Payer: Self-pay | Admitting: *Deleted

## 2017-06-16 NOTE — Telephone Encounter (Signed)
-----   Message from Howard Pouch, MD sent at 06/16/2017  8:30 AM EDT ----- Please let patient know her kidney function was normal on her office blood work.

## 2017-06-16 NOTE — Telephone Encounter (Signed)
LVM for pt to call back to inform her of below. Zimmerman Rumple, April D, CMA  

## 2017-06-22 ENCOUNTER — Ambulatory Visit: Payer: Self-pay | Admitting: Family Medicine

## 2017-06-23 ENCOUNTER — Other Ambulatory Visit: Payer: Medicaid Other | Admitting: Licensed Clinical Social Worker

## 2017-06-24 ENCOUNTER — Ambulatory Visit: Payer: Self-pay | Admitting: Family Medicine

## 2017-06-30 ENCOUNTER — Other Ambulatory Visit: Payer: Medicaid Other | Admitting: Licensed Clinical Social Worker

## 2017-07-03 ENCOUNTER — Other Ambulatory Visit: Payer: Self-pay | Admitting: Student in an Organized Health Care Education/Training Program

## 2017-07-03 ENCOUNTER — Other Ambulatory Visit: Payer: Self-pay | Admitting: Internal Medicine

## 2017-07-03 DIAGNOSIS — I10 Essential (primary) hypertension: Secondary | ICD-10-CM

## 2017-07-04 ENCOUNTER — Other Ambulatory Visit: Payer: Self-pay

## 2017-07-04 ENCOUNTER — Ambulatory Visit: Payer: Medicaid Other | Admitting: Family Medicine

## 2017-07-04 ENCOUNTER — Encounter: Payer: Self-pay | Admitting: Family Medicine

## 2017-07-04 ENCOUNTER — Other Ambulatory Visit (HOSPITAL_COMMUNITY)
Admission: RE | Admit: 2017-07-04 | Discharge: 2017-07-04 | Disposition: A | Discharge status: Home / Self Care | Payer: Medicaid Other | Source: Ambulatory Visit | Attending: Family Medicine | Admitting: Family Medicine

## 2017-07-04 VITALS — BP 130/80 | HR 88 | Temp 98.0°F | Ht 64.0 in | Wt 336.0 lb

## 2017-07-04 DIAGNOSIS — Z1151 Encounter for screening for human papillomavirus (HPV): Secondary | ICD-10-CM | POA: Diagnosis not present

## 2017-07-04 DIAGNOSIS — N814 Uterovaginal prolapse, unspecified: Secondary | ICD-10-CM | POA: Diagnosis not present

## 2017-07-04 DIAGNOSIS — R238 Other skin changes: Secondary | ICD-10-CM | POA: Diagnosis not present

## 2017-07-04 DIAGNOSIS — Z124 Encounter for screening for malignant neoplasm of cervix: Secondary | ICD-10-CM

## 2017-07-04 DIAGNOSIS — Z0000 Encounter for general adult medical examination without abnormal findings: Secondary | ICD-10-CM

## 2017-07-04 DIAGNOSIS — Z23 Encounter for immunization: Secondary | ICD-10-CM

## 2017-07-04 MED ORDER — TRIAMCINOLONE ACETONIDE 0.1 % EX CREA: 1.0000 "application " | TOPICAL_CREAM | Freq: Two times a day (BID) | CUTANEOUS | 0 refills | Status: DC

## 2017-07-04 NOTE — Patient Instructions (Addendum)
It was nice meeting you today [name]!  Today, we performed a routine pap smear with HPV cotesting.  If your results are abnormal, I will call you.  If they are normal, you would be due for your next pap in five years.  For your possible prolapse, we may need to refer you to GYN in the future if your symptoms really bother you.    If you have any questions or concerns, please feel free to call the clinic.   Be well,  Dr. Frances Furbish

## 2017-07-04 NOTE — Assessment & Plan Note (Signed)
Unsure if this is related to eczema, but area on foot does display hyperpigmentation and excoriations that are likely due to chronic itchiness.  Prescribed hydrocortisone cream.

## 2017-07-04 NOTE — Progress Notes (Signed)
   Subjective:    [name] - 52 y.o. female MRN [id number]  Date of birth: [birth year]  HPI  [name] is here for a pap smear.  She also is worried that she "feels something coming out" when she wipes after urinating.  She cannot describe exactly what this bulge feels like, but she notes that she only feels it when she is wiping after urinating.  She thinks it is coming out of her vagina.  She endorses chronic urge incontinence but denies stress incontinence.   She also has an area on her L foot that is hyperpigmented and itchy.  She was prescribed a cream by Dr. Mosetta Putt for a similar area on her face that helped, but she cannot remember the name of the cream.  She thinks the area may be eczema.  Health Maintenance:  Wants referral to mammography and tetanus/tdap in addition to pap smear today.  Health Maintenance Due  Topic Date Due  . OPHTHALMOLOGY EXAM  05/10/1976  . TETANUS/TDAP  03/26/2015  . PAP SMEAR  09/15/2015  . MAMMOGRAM  05/10/2016    -  reports that she has never smoked. She has never used smokeless tobacco. - Review of Systems: Per HPI. - Past Medical History: Patient Active Problem List   Diagnosis Date Noted  . Health care maintenance 07/04/2017  . Uterine prolapse 07/04/2017  . Skin irritation 07/04/2017  . Encounter for chronic pain management 02/11/2014  . Diabetes (HCC) 02/11/2014  . Cystocele 09/14/2012  . Sarcoid 12/23/2010  . 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  . 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 Asthma 12/01/2007  . 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 OSA (obstructive sleep apnea) 10/30/2007  . Morbid (severe) obesity due to excess calories (HCC) 04/10/2007  . MYASTHENIA 03/07/2007  . SYMPTOM, INCONTINENCE, MIXED, URGE/STRESS 05/30/2006  . DEPRESSIVE DISORDER, NOS 03/24/2006  . Essential hypertension 03/24/2006  . GASTROESOPHAGEAL REFLUX, NO ESOPHAGITIS 03/24/2006   - Medications:  reviewed and updated   Objective:   Physical Exam BP 130/80 (BP Location: Right Arm, Patient Position: Sitting, Cuff Size: Large)   Pulse 88   Temp 98 F (36.7 C) (Oral)   Ht 5\' 4"  (1.626 m)   Wt (!) 336 lb (152.4 kg)   SpO2 99%   BMI 57.67 kg/m  Gen: NAD, alert, cooperative with exam, well-appearing CV: RRR, good S1/S2, no murmur Resp: CTABL, no wheezes, non-labored Abd: SNTND, BS present, no guarding Skin: hyperpigmented area on dorsum of L foot with some excoriations; no skin breakdown Neuro: no gross deficits.  Psych: good insight, alert and oriented GU: vulva and vagina appear normal and without lesions.  Normal vaginal discharge.  Cannot visualize cervix well due to body habitus.  Cannot palpate a mass in the vagina.  Bimanual exam limited by body habitus.       Assessment & Plan:   Health care maintenance Pap smear performed today, although visualization of cervix was limited by body habitus.  Referred for mammogram and given Tetanus/Tdap.  Uterine prolapse Unsure if patient has cystocele or uterine prolapse as exam was limited due to body habitus.  Patient requests referral to gynecology for further investigation of this problem, so referral was placed today.  Skin irritation Unsure if this is related to eczema, but area on foot does display hyperpigmentation and excoriations that are likely due to chronic itchiness.  Prescribed hydrocortisone cream.    , M.D. 07/04/2017, 4:21 PM PGY-1, [facility]

## 2017-07-04 NOTE — Telephone Encounter (Signed)
I am covering Dr. Latanya Maudlin inbox. Appears that she discontinued naprosen on 06/14/17, will not refill this.

## 2017-07-04 NOTE — Assessment & Plan Note (Signed)
Pap smear performed today, although visualization of cervix was limited by body habitus.  Referred for mammogram and given Tetanus/Tdap.

## 2017-07-04 NOTE — Assessment & Plan Note (Signed)
Unsure if patient has cystocele or uterine prolapse as exam was limited due to body habitus.  Patient requests referral to gynecology for further investigation of this problem, so referral was placed today.

## 2017-07-07 ENCOUNTER — Other Ambulatory Visit: Payer: Self-pay | Admitting: Licensed Clinical Social Worker

## 2017-07-07 ENCOUNTER — Encounter: Payer: Self-pay | Admitting: Family Medicine

## 2017-07-07 DIAGNOSIS — F329 Major depressive disorder, single episode, unspecified: Secondary | ICD-10-CM

## 2017-07-07 DIAGNOSIS — F32A Depression, unspecified: Secondary | ICD-10-CM

## 2017-07-07 LAB — CYTOLOGY - PAP
ADEQUACY: ABSENT
DIAGNOSIS: NEGATIVE
HPV (WINDOPATH): NOT DETECTED

## 2017-07-07 NOTE — Progress Notes (Signed)
   THERAPY PROGRESS NOTE  Session Time:  Participation Level: Active  Behavioral Response: Disheveled and Fairly GroomedAlertDepressed  Type of Therapy: Individual Therapy  Treatment Goals addressed: Coping  Interventions: Strength-based and Supportive  Summary: [name] is a 52 y.o. female who presents with a depressed mood and appropriate affect. She reported that she has felt very stressed and down recently. She shared that her older son came home for a visit and they had a lot of conflict. She stated that she does not "feel like dealing" with his negative behaviors and that it would be better for him to go to [employer] than move back home. She expressed concerns about her health as well as the health of her daughter and son who live with her. She did not appear receptive to LCSW feedback about the lifestyle changes that the family might be able to do together -- eat healthier and be more active. [name] reported that she would like assistance applying for a scholarship at the [facility] and finding free activities over the summer for her child.  Suicidal/Homicidal: Nowithout intent/plan  Therapist Response: LCSW utilized supportive counseling techniques throughout the session in order to validate emotions and encourage open expression of emotion. LCSW provided information about the [facility] summer activities as well as summer movie programs. LCSW encouraged [name] to think about small lifestyle changes that the family could do together to improve their health.  Plan: Return again in 2 weeks.   [name], LCSW 07/07/2017

## 2017-07-08 ENCOUNTER — Other Ambulatory Visit: Payer: Self-pay

## 2017-07-08 DIAGNOSIS — I159 Secondary hypertension, unspecified: Secondary | ICD-10-CM

## 2017-07-08 MED ORDER — CATAPRES-TTS-3 0.3 MG/24HR TD PTWK: 0.3000 mg | MEDICATED_PATCH | TRANSDERMAL | 3 refills | Status: DC

## 2017-07-08 NOTE — Telephone Encounter (Signed)
Fax from pharmacy asking to send Clonidine as name brand for Medicaid to cover.  Ples Specter, RN [facility] RN)

## 2017-07-20 ENCOUNTER — Other Ambulatory Visit: Payer: Self-pay | Admitting: Family Medicine

## 2017-07-20 DIAGNOSIS — Z1231 Encounter for screening mammogram for malignant neoplasm of breast: Secondary | ICD-10-CM

## 2017-07-21 ENCOUNTER — Other Ambulatory Visit: Payer: Medicaid Other | Admitting: Licensed Clinical Social Worker

## 2017-08-04 ENCOUNTER — Other Ambulatory Visit: Payer: Self-pay | Admitting: Licensed Clinical Social Worker

## 2017-08-04 DIAGNOSIS — F32A Depression, unspecified: Secondary | ICD-10-CM

## 2017-08-04 DIAGNOSIS — F329 Major depressive disorder, single episode, unspecified: Secondary | ICD-10-CM

## 2017-08-08 ENCOUNTER — Encounter: Payer: Self-pay | Admitting: Obstetrics & Gynecology

## 2017-08-08 ENCOUNTER — Ambulatory Visit: Payer: Medicaid Other | Admitting: Obstetrics & Gynecology

## 2017-08-08 VITALS — BP 131/95 | HR 90 | Wt 338.0 lb

## 2017-08-08 DIAGNOSIS — N816 Rectocele: Secondary | ICD-10-CM

## 2017-08-08 DIAGNOSIS — N393 Stress incontinence (female) (male): Secondary | ICD-10-CM

## 2017-08-08 DIAGNOSIS — N8111 Cystocele, midline: Secondary | ICD-10-CM

## 2017-08-08 NOTE — Progress Notes (Signed)
History:  52 y.o. No obstetric history on file. here today for eval of uterine prolapse.  Pt reports that she feels a lump in the vagina when she goes to wipe.  She does not notice this at other times. She is not sure if this bulge is present more with voiding or passing a stool. Pt does not drink coffee. She does drink tea occ. Will occ have a large tea from [employer]. Pt also drinks at least 16 oz [place] Dew daily usually more. They are diet but, not caffeine free.     The following portions of the patient's history were reviewed and updated as appropriate: allergies, current medications, past family history, past medical history, past social history, past surgical history and problem list.  Review of Systems:  Pertinent items are noted in HPI.    Objective:  Physical Exam  CONSTITUTIONAL: Well-developed, well-nourished female in no acute distress.  HENT:  Normocephalic, atraumatic EYES: Conjunctivae and EOM are normal. No scleral icterus.  NECK: Normal range of motion SKIN: Skin is warm and dry. No rash noted. Not diaphoretic.No pallor. NEUROLGIC: Alert and oriented to person, place, and time. Normal coordination.  Abd: obese; Soft, nontender and nondistended Pelvic: Normal appearing external genitalia; normal appearing vaginal mucosa and cervix.  There is midline prolapse of the bladder- grade I-II and midline prolapse of the rectum Grade II. Normal discharge.  Small uterus, no other palpable masses, no uterine or adnexal tenderness. The exam is limited by body habitus.   Labs and Imaging No results found.  Assessment & Plan:  Urinary incontinence in female  Rec discontinue caffeine use  Kegel exercise  PT consult for bladder training and Kegel's assistance.  Prolapse  Sx not bothersome for pt. Will follow  F/u in 3 months or sooner prn  [place] L. Harraway-Smith, M.D., [place] [place]

## 2017-08-08 NOTE — Progress Notes (Signed)
Declined Integrated Behavioral Health Clinician 

## 2017-08-08 NOTE — Patient Instructions (Addendum)
Kegel Exercises Kegel exercises help strengthen the muscles that support the rectum, vagina, small intestine, bladder, and uterus. Doing Kegel exercises can help:  Improve bladder and bowel control.  Improve sexual response.  Reduce problems and discomfort during pregnancy.  Kegel exercises involve squeezing your pelvic floor muscles, which are the same muscles you squeeze when you try to stop the flow of urine. The exercises can be done while sitting, standing, or lying down, but it is best to vary your position. Phase 1 exercises 1. Squeeze your pelvic floor muscles tight. You should feel a tight lift in your rectal area. If you are a female, you should also feel a tightness in your vaginal area. Keep your stomach, buttocks, and legs relaxed. 2. Hold the muscles tight for up to 10 seconds. 3. Relax your muscles. Repeat this exercise 50 times a day or as many times as told by your health care provider. Continue to do this exercise for at least 4-6 weeks or for as long as told by your health care provider. This information is not intended to replace advice given to you by your health care provider. Make sure you discuss any questions you have with your health care provider. Document Released: 12/29/2011 Document Revised: 09/06/2015 Document Reviewed: 12/01/2014 Elsevier Interactive Patient Education  2018 [employer] Urinary Incontinence Urinary incontinence is the involuntary loss of urine from your bladder. What are the causes? There are many causes of urinary incontinence. They include:  Medicines.  Infections.  Prostatic enlargement, leading to overflow of urine from your bladder.  Surgery.  Neurological diseases.  Emotional factors.  What are the signs or symptoms? Urinary Incontinence can be divided into four types: 4. Urge incontinence. Urge incontinence is the involuntary loss of urine before you have the opportunity to go to the bathroom. There is a sudden urge to  void but not enough time to reach a bathroom. 5. Stress incontinence. Stress incontinence is the sudden loss of urine with any activity that forces urine to pass. It is commonly caused by anatomical changes to the pelvis and sphincter areas of your body. 6. Overflow incontinence. Overflow incontinence is the loss of urine from an obstructed opening to your bladder. This results in a backup of urine and a resultant buildup of pressure within the bladder. When the pressure within the bladder exceeds the closing pressure of the sphincter, the urine overflows, which causes incontinence, similar to water overflowing a dam. 7. Total incontinence. Total incontinence is the loss of urine as a result of the inability to store urine within your bladder.  How is this diagnosed? Evaluating the cause of incontinence may require:  A thorough and complete medical and obstetric history.  A complete physical exam.  Laboratory tests such as a urine culture and sensitivities.  When additional tests are indicated, they can include:  An ultrasound exam.  Kidney and bladder X-rays.  Cystoscopy. This is an exam of the bladder using a narrow scope.  Urodynamic testing to test the nerve function to the bladder and sphincter areas.  How is this treated? Treatment for urinary incontinence depends on the cause:  For urge incontinence caused by a bacterial infection, antibiotics will be prescribed. If the urge incontinence is related to medicines you take, your health care provider may have you change the medicine.  For stress incontinence, surgery to re-establish anatomical support to the bladder or sphincter, or both, will often correct the condition.  For overflow incontinence caused by an enlarged prostate, an operation   to open the channel through the enlarged prostate will allow the flow of urine out of the bladder. In women with fibroids, a hysterectomy may be recommended.  For total incontinence, surgery  on your urinary sphincter may help. An artificial urinary sphincter (an inflatable cuff placed around the urethra) may be required. In women who have developed a hole-like passage between their bladder and vagina (vesicovaginal fistula), surgery to close the fistula often is required.  Follow these instructions at home:  Normal daily hygiene and the use of pads or adult diapers that are changed regularly will help prevent odors and skin damage.  Avoid caffeine. It can overstimulate your bladder.  Use the bathroom regularly. Try about every 2-3 hours to go to the bathroom, even if you do not feel the need to do so. Take time to empty your bladder completely. After urinating, wait a minute. Then try to urinate again.  For causes involving nerve dysfunction, keep a log of the medicines you take and a journal of the times you go to the bathroom. Contact a health care provider if:  You experience worsening of pain instead of improvement in pain after your procedure.  Your incontinence becomes worse instead of better. Get help right away if:  You experience fever or shaking chills.  You are unable to pass your urine.  You have redness spreading into your groin or down into your thighs. This information is not intended to replace advice given to you by your health care provider. Make sure you discuss any questions you have with your health care provider. Document Released: 02/19/2004 Document Revised: 08/22/2015 Document Reviewed: 06/20/2012 Elsevier Interactive Patient Education  2018 Elsevier Inc.  

## 2017-08-10 ENCOUNTER — Ambulatory Visit: Payer: Self-pay

## 2017-08-10 NOTE — Progress Notes (Signed)
   THERAPY PROGRESS NOTE  Session Time:  Participation Level: Active  Behavioral Response: Disheveled and Fairly GroomedAlertDepressed  Type of Therapy: Individual Therapy  Treatment Goals addressed: Coping  Interventions: Strength-based and Supportive  Summary: [name] is a 52 y.o. female who presents with a depressed mood and appropriate affect. She reported that her request for a personal care assistant through Medicaid had been denied. She shared that she and her family went on a trip to visit other family members across the state and had a great time. She shared about the difficulties that she continues to have with her teenage son, who is currently in a residential treatment program. She shared that she received a letter from him about his feelings of hurt that she will not accept him. She shared about the religious reasons that she cannot accept her gay son. She did not appear receptive to LCSW feedback about the importance of being open-minded so that they can have a better relationship, and psychoeducation around sexual orientation.   Suicidal/Homicidal: Nowithout intent/plan  Therapist Response: LCSW utilized supportive counseling techniques throughout the session in order to validate emotions and encourage open expression of emotion. LCSW offered to refer [name] to [employer] for help with the Medicaid appeal. LCSW and [name] processed about her feelings towards her son. LCSW encouraged her to prioritize her relationship with her son above her feelings about his sexuality. LCSW emphasized that sexual orientation is not a choice.  Plan: Return again in 2 weeks.   [name], LCSW 08/10/2017

## 2017-08-11 ENCOUNTER — Encounter: Payer: Self-pay | Admitting: Obstetrics & Gynecology

## 2017-08-15 ENCOUNTER — Other Ambulatory Visit: Payer: Self-pay | Admitting: Student in an Organized Health Care Education/Training Program

## 2017-08-15 DIAGNOSIS — G8929 Other chronic pain: Secondary | ICD-10-CM

## 2017-08-15 DIAGNOSIS — I10 Essential (primary) hypertension: Secondary | ICD-10-CM

## 2017-08-17 ENCOUNTER — Ambulatory Visit: Payer: Medicaid Other | Attending: Obstetrics & Gynecology | Admitting: Physical Therapy

## 2017-08-18 ENCOUNTER — Other Ambulatory Visit: Payer: Medicaid Other | Admitting: Licensed Clinical Social Worker

## 2017-09-06 ENCOUNTER — Ambulatory Visit
Admission: RE | Admit: 2017-09-06 | Discharge: 2017-09-06 | Disposition: A | Discharge status: Home / Self Care | Payer: Medicaid Other | Source: Ambulatory Visit | Attending: Family Medicine | Admitting: Family Medicine

## 2017-09-06 DIAGNOSIS — Z1231 Encounter for screening mammogram for malignant neoplasm of breast: Secondary | ICD-10-CM

## 2017-09-08 ENCOUNTER — Other Ambulatory Visit: Payer: Self-pay | Admitting: Licensed Clinical Social Worker

## 2017-09-08 DIAGNOSIS — F32A Depression, unspecified: Secondary | ICD-10-CM

## 2017-09-08 DIAGNOSIS — F329 Major depressive disorder, single episode, unspecified: Secondary | ICD-10-CM

## 2017-09-12 NOTE — Progress Notes (Signed)
   THERAPY PROGRESS NOTE  Session Time:  Participation Level: Active  Behavioral Response: Casual and DisheveledAlertDepressed  Type of Therapy: Individual Therapy  Treatment Goals addressed: Coping  Interventions: Strength-based and Supportive  Summary: [name] is a 52 y.o. female who presents with a slightly depressed mood and appropriate affect. She reported that she has a lot of stress right now due to two recent visits with her older son and the plan for him to move back home next month. She shared that she feels he has not changed in the past year being at the group home, and is worried that he will immediately revert to risky and noncompliant behaviors. She shared that she told him clearly that he cannot "express himself" (sexualy orientation) as long as he lives in her house. [name] also shared that [employer] was helping her with her appeal for a personal care assistant with Medicaid. She expressed the frustrations that she has with her children for not helping clean the apartment. She shared that her adult daughter also has significant health problems that limit her mobility and ability to help, but that she could do more. She expressed the fear of eviction due to the condition of her apartment.  Suicidal/Homicidal: Nowithout intent/plan  Therapist Response: LCSW utilized supportive counseling techniques throughout the session in order to validate emotions and encourage open expression of emotion. LCSW and [name] processed about her relationship with her older son and what she imagines for his return to the home. LCSW emphasized the importance of allowing her son to be open about his sexual orientation. LCSW followed up with [name] about the [employer] referal and her personal care appeal.  Plan: Return again in 2 weeks.    [name], LCSW 09/12/2017

## 2017-09-18 ENCOUNTER — Other Ambulatory Visit: Payer: Self-pay | Admitting: Student in an Organized Health Care Education/Training Program

## 2017-09-18 DIAGNOSIS — G8929 Other chronic pain: Secondary | ICD-10-CM

## 2017-09-18 DIAGNOSIS — I10 Essential (primary) hypertension: Secondary | ICD-10-CM

## 2017-09-22 ENCOUNTER — Other Ambulatory Visit: Payer: Medicaid Other | Admitting: Licensed Clinical Social Worker

## 2017-10-04 NOTE — Addendum Note (Signed)
Addended by: [name] on: 10/04/2017 08:59 AM   Modules accepted: Level of Service

## 2017-10-27 ENCOUNTER — Other Ambulatory Visit: Payer: Self-pay

## 2017-10-27 ENCOUNTER — Ambulatory Visit: Payer: Medicaid Other | Admitting: Student in an Organized Health Care Education/Training Program

## 2017-10-27 ENCOUNTER — Encounter: Payer: Self-pay | Admitting: Student in an Organized Health Care Education/Training Program

## 2017-10-27 VITALS — BP 134/88 | HR 90 | Temp 98.4°F | Ht 64.0 in | Wt 324.8 lb

## 2017-10-27 DIAGNOSIS — E0810 Diabetes mellitus due to underlying condition with ketoacidosis without coma: Secondary | ICD-10-CM

## 2017-10-27 DIAGNOSIS — Z23 Encounter for immunization: Secondary | ICD-10-CM | POA: Diagnosis not present

## 2017-10-27 DIAGNOSIS — G8929 Other chronic pain: Secondary | ICD-10-CM

## 2017-10-27 DIAGNOSIS — G894 Chronic pain syndrome: Secondary | ICD-10-CM

## 2017-10-27 DIAGNOSIS — M25561 Pain in right knee: Secondary | ICD-10-CM | POA: Diagnosis not present

## 2017-10-27 LAB — POCT GLYCOSYLATED HEMOGLOBIN (HGB A1C): HbA1c, POC (controlled diabetic range): 9.4 % — AB (ref 0.0–7.0)

## 2017-10-27 MED ORDER — OXYCODONE HCL 5 MG PO TABS
5.0000 mg | ORAL_TABLET | Freq: Three times a day (TID) | ORAL | 0 refills | Status: DC | PRN
Start: 2017-10-27 — End: 2018-01-31

## 2017-10-27 MED ORDER — SULINDAC 200 MG PO TABS: 200.0000 mg | ORAL_TABLET | Freq: Two times a day (BID) | ORAL | 0 refills | Status: DC | PRN

## 2017-10-27 MED ORDER — OXYCODONE HCL 5 MG PO TABS: 5.0000 mg | ORAL_TABLET | Freq: Three times a day (TID) | ORAL | 0 refills | Status: DC | PRN

## 2017-10-27 NOTE — Assessment & Plan Note (Addendum)
Morbid obesity is likely the primary contributor to her chronic pain.  She is aware of this and has been making a strong effort to lose weight.  - Continue to encourage weight loss efforts -Home health needs were addressed, 3 and 1 and shower bench were written out for her to take to [facility] -Ambulatory referral to gastric surgery

## 2017-10-27 NOTE — Progress Notes (Signed)
Subjective:    [name] - 52 y.o. female MRN [id number]  Date of birth: 1965-09-19  HPI  [name] is here for follow-up of pain management, diabetes, new right knee pain, and DME orders.  Home health needs Patient reports that she had a home visit in which she did not qualify for home health aide.  She reports that she is dissatisfied with this.  She would like a 3 in 1 commode because she has difficulty getting up from the toilet.  And she additionally asks for a shower bench.   Diabetes A1c is significantly worsened today at 9.4, previous test was 8.3.  Patient is taking Januvia and metformin.  She reports that she has been attempting weight loss and has lost 14 pounds.  She reports that she has lost 100 pounds since her peak weight 2 years ago.  She had previously attempted to go for weight loss surgery, however did not complete the program prior to bariatric surgery.  She reports that she has really been focusing on what she eats in order to be healthier weight.  She reports that she has not interested in starting insulin therapy.  She is open to talking to Dr. Raymondo Band for further diabetes management.  Chronic pain/knee pain she has chronic pain related to osteoarthritis.  This is on the setting of her significant obesity with a BMI of 55.  She reports that she is "miserable 52 of the time."  She feels her right knee in particular is worsened than previously.  She does not recall any specific injury to it, however she feels it may have some sort of soft tissue damage.  Her last x-rays were in 2015 and did show significant arthritis.  Her current pain regimen includes oxycodone 5 mg every 8 hours, NSAIDs.  She is looking for a pill that will take her pain away.  We have been decreasing her oxycodone by 3 pills at each refill.  She previously was on 90 pills/month, this was decreased to 87 pills/month 3 months ago.  Today we discussed decreasing to 84 pills/month.  She is very  dissatisfied with this plan, she would prefer to have her 51 pills/month as previously prescribed.  Discussed taking the pain medication only as needed and not standing.  Discussed that if she does have to go for surgery in the future she will have more difficulty with pain control having been on chronic opioids.  She expressed continued frustration with having to cut down on her opioid medication.  Health Maintenance:  Health Maintenance Due  Topic Date Due  . OPHTHALMOLOGY EXAM  05/10/1976  . INFLUENZA VACCINE  08/25/2017  . FOOT EXAM  09/13/2017    -  reports that she has never smoked. She has never used smokeless tobacco. - Review of Systems: Per HPI. - Past Medical History: Patient Active Problem List   Diagnosis Date Noted  . Health care maintenance 07/04/2017  . Uterine prolapse 07/04/2017  . Skin irritation 07/04/2017  . Encounter for chronic pain management 02/11/2014  . Diabetes (HCC) 02/11/2014  . Cystocele 09/14/2012  . Sarcoid 12/23/2010  . 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  . 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 Asthma 12/01/2007  . 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 OSA (obstructive sleep apnea) 10/30/2007  . Morbid (severe) obesity due to excess calories (HCC) 04/10/2007  . MYASTHENIA 03/07/2007  . SYMPTOM, INCONTINENCE, MIXED, URGE/STRESS 05/30/2006  . DEPRESSIVE DISORDER, NOS 03/24/2006  . Essential  hypertension 03/24/2006  . GASTROESOPHAGEAL REFLUX, NO ESOPHAGITIS 03/24/2006   - Medications: reviewed and updated Current Outpatient Medications  Medication Sig Dispense Refill  . acetaminophen (TYLENOL) 500 MG tablet Take 500 mg by mouth every 6 (six) hours as needed.    . budesonide-formoterol (SYMBICORT) 160-4.5 MCG/ACT inhaler Inhale 2 puffs into the lungs 2 (two) times daily. 1 Inhaler 3  . CATAPRES-TTS-3 0.3 MG/24HR patch Place 1 patch (0.3 mg total) onto the skin once a week. 4 patch 3  . cetirizine  (ZYRTEC) 10 MG tablet Take 1 tablet (10 mg total) by mouth daily. 90 tablet 3  . cloNIDine (CATAPRES) 0.1 MG tablet TAKE 1 TABLET (0.1 MG TOTAL) BY MOUTH 3 (THREE) TIMES DAILY. ONLY TO BE TAKEN IF PATCH FALLS OFF 30 tablet 0  . FLUoxetine (PROZAC) 40 MG capsule Take 1 capsule (40 mg total) by mouth daily. 30 capsule 11  . fluticasone (FLONASE) 50 MCG/ACT nasal spray Place 2 sprays into both nostrils daily. (Patient not taking: Reported on 08/08/2017) 16 g 6  . furosemide (LASIX) 20 MG tablet Take 20 mg by mouth daily as needed.    Marland Kitchen guaiFENesin-codeine 100-10 MG/5ML syrup Take 5 mLs by mouth at bedtime as needed for cough (Take for 5-7 days). 120 mL 0  . ibuprofen (ADVIL,MOTRIN) 200 MG tablet Take 200 mg by mouth every 6 (six) hours as needed.    Marland Kitchen losartan-hydrochlorothiazide (HYZAAR) 100-25 MG tablet TAKE 1 TABLET BY MOUTH EVERY DAY 30 tablet 2  . metFORMIN (GLUCOPHAGE) 1000 MG tablet Take 1 tablet (1,000 mg total) by mouth 2 (two) times daily with a meal. 180 tablet 3  . montelukast (SINGULAIR) 10 MG tablet Take 1 tablet (10 mg total) at bedtime by mouth. 30 tablet 11  . omeprazole (PRILOSEC) 40 MG capsule Take 1 capsule (40 mg total) by mouth daily. 60 capsule 11  . oxybutynin (DITROPAN) 5 MG tablet TAKE 1 TABLET BY MOUTH TWICE A DAY 60 tablet 3  . oxyCODONE (OXY IR/ROXICODONE) 5 MG immediate release tablet Take 1 tablet (5 mg total) by mouth every 8 (eight) hours as needed for severe pain. 84 tablet 0  . polyethylene glycol (MIRALAX / GLYCOLAX) packet Take 17 g by mouth daily as needed for mild constipation.    Marland Kitchen PROAIR HFA 108 (90 Base) MCG/ACT inhaler TAKE 2 PUFFS BY MOUTH EVERY 4 HOURS AS NEEDED 8.5 Inhaler 3  . sitaGLIPtin (JANUVIA) 100 MG tablet Take 1 tablet (100 mg total) daily by mouth. 90 tablet 3  . sulindac (CLINORIL) 200 MG tablet Take 1 tablet (200 mg total) by mouth 2 (two) times daily as needed (pain). 60 tablet 0  . triamcinolone cream (KENALOG) 0.1 % Apply 1 application  topically 2 (two) times daily. 30 g 0  . VOLTAREN 1 % GEL APPLY 2 G TOPICALLY 4 (FOUR) TIMES DAILY AS NEEDED (KNEE PAIN). 100 g 0   No current facility-administered medications for this visit.     Review of Systems See HPI     Objective:   Physical Exam BP 134/88   Pulse 90   Temp 98.4 F (36.9 C) (Oral)   Ht 5\' 4"  (1.626 m)   Wt (!) 324 lb 12.8 oz (147.3 kg)   SpO2 99%   BMI 55.75 kg/m  Gen: Chronically ill-appearing female has a walker CV: Regular rate noted Resp: Comfortable work of breathing Abd: SNTND Skin: no rashes, normal turgor  Neuro: no gross deficits.  Psych: good insight, alert and oriented Right knee:  No obvious effusion or bony abnormalities, no erythema or warmth.  Diffuse joint line tenderness.  + Nonpainful patellar compression.  Patellar glide with crepitus.  Normal to inspection with no erythema or effusion or obvious bony abnormalities.  Pain is worse with palpation of the lateral joint line.  Hamstring and quadricep strength is normal.  Patellar quadriceps tendons unremarkable.  Ligaments of solid and consistent endpoints, however overall exam is limited due to patient's obesity.  Assessment & Plan:   Morbid (severe) obesity due to excess calories (HCC) Morbid obesity is likely the primary contributor to her chronic pain.  She is aware of this and has been making a strong effort to lose weight.  - Continue to encourage weight loss efforts -Home health needs were addressed, 3 and 1 and shower bench were written out for her to take [facility] -Ambulatory referral to gastric surgery  Diabetes (HCC) Patient's A1c has worsened despite recent weight loss.  She is not open to starting insulin at today's visit, however she does agree to speak with Dr. Raymondo Band about further management of her diabetes.  Advised her to make an appointment with him as she leaves today.  Encounter for chronic pain management We will continue to titrate down the  patient's pain medications of 84 pills/month.  Plan on refilling #81 pills/month at her next visit.  For her knee pain, it is a very difficult exam due to morbid obesity.  I think she may benefit from a joint injection for pain relief, however this would most likely be best done under ultrasound guidance. -We will refer to sports medicine to the patient might undergo ultrasound and potentially ultrasound-guided steroid injection of the right knee -Patient can use over-the-counter capsaicin for topical relief -Refilled Sulinac, refilled oxycodone   Orders Placed This Encounter  Procedures  . Flu Vaccine QUAD 36+ mos IM  . Ambulatory referral to Sports Medicine    Referral Priority:   Routine    Referral Type:   Consultation    Number of Visits Requested:   1  . Amb Referral to Bariatric Surgery    Referral Priority:   Routine    Referral Type:   Consultation    Number of Visits Requested:   1  . HgB A1c    Meds ordered this encounter  Medications  . DISCONTD: oxyCODONE (OXY IR/ROXICODONE) 5 MG immediate release tablet    Sig: Take 1 tablet (5 mg total) by mouth every 8 (eight) hours as needed for severe pain.    Dispense:  84 tablet    Refill:  0    To be filled 07/27/2017 or later  . DISCONTD: oxyCODONE (OXY IR/ROXICODONE) 5 MG immediate release tablet    Sig: Take 1 tablet (5 mg total) by mouth every 8 (eight) hours as needed for severe pain.    Dispense:  84 tablet    Refill:  0    To be filled 11/27/2017 or later  . oxyCODONE (OXY IR/ROXICODONE) 5 MG immediate release tablet    Sig: Take 1 tablet (5 mg total) by mouth every 8 (eight) hours as needed for severe pain.    Dispense:  84 tablet    Refill:  0    To be filled 12/27/2017 or later  . sulindac (CLINORIL) 200 MG tablet    Sig: Take 1 tablet (200 mg total) by mouth 2 (two) times daily as needed (pain).    Dispense:  60 tablet    Refill:  0    Howard Pouch, MD,MS,  PGY3 10/27/2017 5:29 PM

## 2017-10-27 NOTE — Assessment & Plan Note (Signed)
Patient's A1c has worsened despite recent weight loss.  She is not open to starting insulin at today's visit, however she does agree to speak with Dr. Raymondo Band about further management of her diabetes.  Advised her to make an appointment with him as she leaves today.

## 2017-10-27 NOTE — Patient Instructions (Signed)
It was a pleasure seeing you today in our clinic. Here is the treatment plan we have discussed and agreed upon together:  A consult was placed to sports medicine at today's visit.  You will receive a call to schedule an appointment. If you do not receive a call within two weeks please call our office so we can place the consult again.  Please schedule a visit with Dr. Raymondo Band for diabetes management  A paper DME script was attached for your 3-in-1  Our clinic's number is [phone]. Please call with questions or concerns about what we discussed today.  Be well, Dr. Mosetta Putt

## 2017-10-27 NOTE — Assessment & Plan Note (Addendum)
We will continue to titrate down the patient's pain medications of 84 pills/month.  Plan on refilling #81 pills/month at her next visit.  For her knee pain, it is a very difficult exam due to morbid obesity.  I think she may benefit from a joint injection for pain relief, however this would most likely be best done under ultrasound guidance. -We will refer to sports medicine to the patient might undergo ultrasound and potentially ultrasound-guided steroid injection of the right knee -Patient can use over-the-counter capsaicin for topical relief -Refilled Sulinac, refilled oxycodone

## 2017-10-28 ENCOUNTER — Telehealth: Payer: Self-pay | Admitting: *Deleted

## 2017-10-28 NOTE — Telephone Encounter (Signed)
Received fax from [facility] requesting prior authorization of Oxycodone .  Form placed in MD's box for completion along with Medicaid formulary.   Will need to attach last OVN when we place in [facility] as this is a continuing med. Marybeth Dandy, Maryjo Rochester, CMA

## 2017-10-31 ENCOUNTER — Ambulatory Visit: Payer: Medicaid Other | Admitting: Sports Medicine

## 2017-10-31 VITALS — BP 146/69 | Ht 64.0 in | Wt 324.0 lb

## 2017-10-31 DIAGNOSIS — M25561 Pain in right knee: Secondary | ICD-10-CM | POA: Diagnosis not present

## 2017-10-31 NOTE — Patient Instructions (Addendum)
It was great to meet you today! Thank you for letting me participate in your care!  Today, we discussed your right knee pain. We will get repeat imaging today to ensure no acute issues occurred to your right knee causing your pain, such as a fracture. I believe your current pain is an acute exacerbation of your right knee arthritis. We will also wrap your need with an ace wrap. You can take it off when you bathe and shower but try to have someone help you put it back on as compression will help. You can also continue to ice your knee and use heating pads.  You can try Capzasin Cream for your pain.    Be well, Jules Schick, DO PGY-2, [facility]

## 2017-10-31 NOTE — Progress Notes (Signed)
Subjective:  HPI: [name] is a 52 y.o. presenting to clinic today to discuss the following:  Right Knee Pain Patient with PMH of known osteoarthritis in both knees presents today after a fall about 5 months ago and is now endorsing 4 months of right knee pain. She does not remember any traumatic injury at that time or hearing any "pop" but states her pain has simply continued and gotten worse. Patient states no position is better than another, movement of any kind causes pain, her pain is waking her up at night with associated intermittent swelling. Pain is described as a burning and throbbing pain.  She denies fever, chills, night sweats, nausea, vomiting  ROS noted in HPI.   Past Medical, Surgical, Social, and Family History Reviewed & Updated per EMR.   Pertinent Historical Findings include:   Social History   Tobacco Use  Smoking Status Never Smoker  Smokeless Tobacco Never Used   Objective: BP (!) 146/69   Ht 5\' 4"  (1.626 m)   Wt (!) 324 lb (147 kg)   BMI 55.61 kg/m  Vitals and nursing notes reviewed  Physical Exam Gen: Alert and Oriented x 3, NAD, obese HEENT: Normocephalic, atraumatic Right Knee:  Difficult exam to perform due to patient body habitus and her extreme tenderness to movement and palpation.  Inspection shows a small hypopigmented area along the anterior lateral knee as a result of a previous cortisone injection.  Obese with no obvious sign of swelling. Not warm to touch, no erythema. Diffuse TTP in the right knee. FROM in extension, flexion limited to 95-100 degrees. Anterior and posterior drawer test negative, varus and valgus test negative Ext: no clubbing, cyanosis, or edema Neuro: No gross deficits, no gross sensory deficits bilaterally, 4/5 strength bilaterally in LE Skin: warm, dry, intact, no rashes  No results found for this or any previous visit (from the past 72 hour(s)).  Assessment/Plan:  Right knee pain Patient most likely  has acute exacerbation of chronic right knee osteoarthritis - Ace wrap - Right leg Standing AP, Lateral, and sunrise view to ensure no acute fracture or abnormality  - Counseled and encouraged weight loss so she could become a surgical candidate - OTC Capzasin cream for acute bouts of pain as patient is not responding to voltaren gel. Of note patient has also tried corticosteroid injections and viscous injections in the past without relief.   PATIENT EDUCATION PROVIDED: See AVS    Diagnosis and plan along with any newly prescribed medication(s) were discussed in detail with this patient today. The patient verbalized understanding and agreed with the plan. Patient advised if symptoms worsen return to clinic or ER.   Health Maintainance:   Orders Placed This Encounter  Procedures  . DG Knee AP/LAT W/Sunrise Right    Standing Status:   Future    Standing Expiration Date:   01/01/2019    Order Specific Question:   Reason for Exam (SYMPTOM  OR DIAGNOSIS REQUIRED)    Answer:   right knee pain    Order Specific Question:   Is patient pregnant?    Answer:   No    Order Specific Question:   Preferred imaging location?    Answer:   [facility]    Order Specific Question:   Radiology Contrast Protocol - do NOT remove file path    Answer:   [email or web address]    14/07/2018, DO 10/31/2017, 3:49 PM PGY-2 [facility]  Patient seen  and evaluated with the resident.  I agree with the above plan of care.  Patient symptoms are likely an exacerbation of her osteoarthritis.  X-rays done in 2015 showed advanced arthritis at that time.  However, given her history of trauma, we will get an x-ray to rule out possible fracture.  Phone follow-up with those results when available.  If no acute finding is seen, patient will need to continue with conservative treatment as she is not a surgical candidate based on her BMI.  She has had several  cortisone injections in the past but they are no longer beneficial and she has evidence of hypopigmentation from one of her previous injections.  Visco supplementation was also ineffective.  Treatment options are very limited.  I did discuss the possibility of physical therapy with her and she would be amendable to that if insurance will cover it.  I also recommended that she try over-the-counter capsaicin cream.

## 2017-10-31 NOTE — Assessment & Plan Note (Signed)
Patient most likely has acute exacerbation of chronic right knee osteoarthritis - Ace wrap - Right leg Standing AP, Lateral, and sunrise view to ensure no acute fracture or abnormality  - Counseled and encouraged weight loss so she could become a surgical candidate - OTC Capzasin cream for acute bouts of pain as patient is not responding to voltaren gel. Of note patient has also tried corticosteroid injections and viscous injections in the past without relief.

## 2017-11-01 ENCOUNTER — Encounter: Payer: Self-pay | Admitting: Sports Medicine

## 2017-11-01 ENCOUNTER — Other Ambulatory Visit: Payer: Self-pay | Admitting: Licensed Clinical Social Worker

## 2017-11-01 DIAGNOSIS — F32A Depression, unspecified: Secondary | ICD-10-CM

## 2017-11-01 DIAGNOSIS — F329 Major depressive disorder, single episode, unspecified: Secondary | ICD-10-CM

## 2017-11-02 ENCOUNTER — Other Ambulatory Visit: Payer: Self-pay | Admitting: Student in an Organized Health Care Education/Training Program

## 2017-11-02 NOTE — Telephone Encounter (Signed)
Completed PA info in [facility] Tracks for Oxycodone.  Status pending.  Will recheck status in 24 hours. Nigel Mormon, RN

## 2017-11-02 NOTE — Telephone Encounter (Signed)
Placed in nurse box including last OVN

## 2017-11-03 NOTE — Progress Notes (Signed)
   THERAPY PROGRESS NOTE  Session Time:  Participation Level: Active  Behavioral Response: Disheveled and Fairly GroomedAlertDepressed  Type of Therapy: Individual Therapy  Treatment Goals addressed: Coping  Interventions: Strength-based and Supportive  Summary: [name] is a 52 y.o. female who presents with a depressed mood and appropriate affect. She reported that she has not been doing well over the past few weeks, as she has been "hibernating" due to stress and depressive symptoms. She shared that her older son had returned home from his treatment facility and was wreaking havoc in the family, as he refuses to go to school or listen to [place]. She expressed hopelessness that he will follow the rules or help around the house. She shared that she "doesn't know if she can make it" five more months until he turns 17 and she can make him leave the house. She shared about the stress and worry she feels every time he leaves the house and doesn't return for long stretches of time. [name] also shared her increasing worry about finances, as her disability check was reduced slightly and her rent is going up. She acknowledged that she needs to continue attending counseling regularly, as it feels good to have an outlet.  Suicidal/Homicidal: Nowithout intent/plan  Therapist Response: LCSW utilized supportive counseling techniques throughout the session in order to validate emotions and encourage open expression of emotion. LCSW and Alliya processed about her current stressors. LCSW encouraged Orra to talk openly with her children about their responsibilities at home. LCSW provided her with a list of affordable housing options for people on disability.  Plan: Return again in 2 weeks.   [name], LCSW 11/03/2017

## 2017-11-04 NOTE — Telephone Encounter (Signed)
Prior approval for Oxycodone completed via [place] Tracks.  Med approved for 11/02/17 - 05/01/18.  Prior approval  # [id number].  [facility] informed.   Ples Specter, RN [facility] RN)

## 2017-11-05 IMAGING — DX DG CHEST 2V
2 series · 2 of 2 positions shown · non-contrast
Comparison: Chest radiograph performed 05/31/2014

CLINICAL DATA: Acute onset of generalized chest and abdominal pain.
Shortness of breath and vomiting. Initial encounter.

EXAM:
CHEST  2 VIEW

[x chest ap]
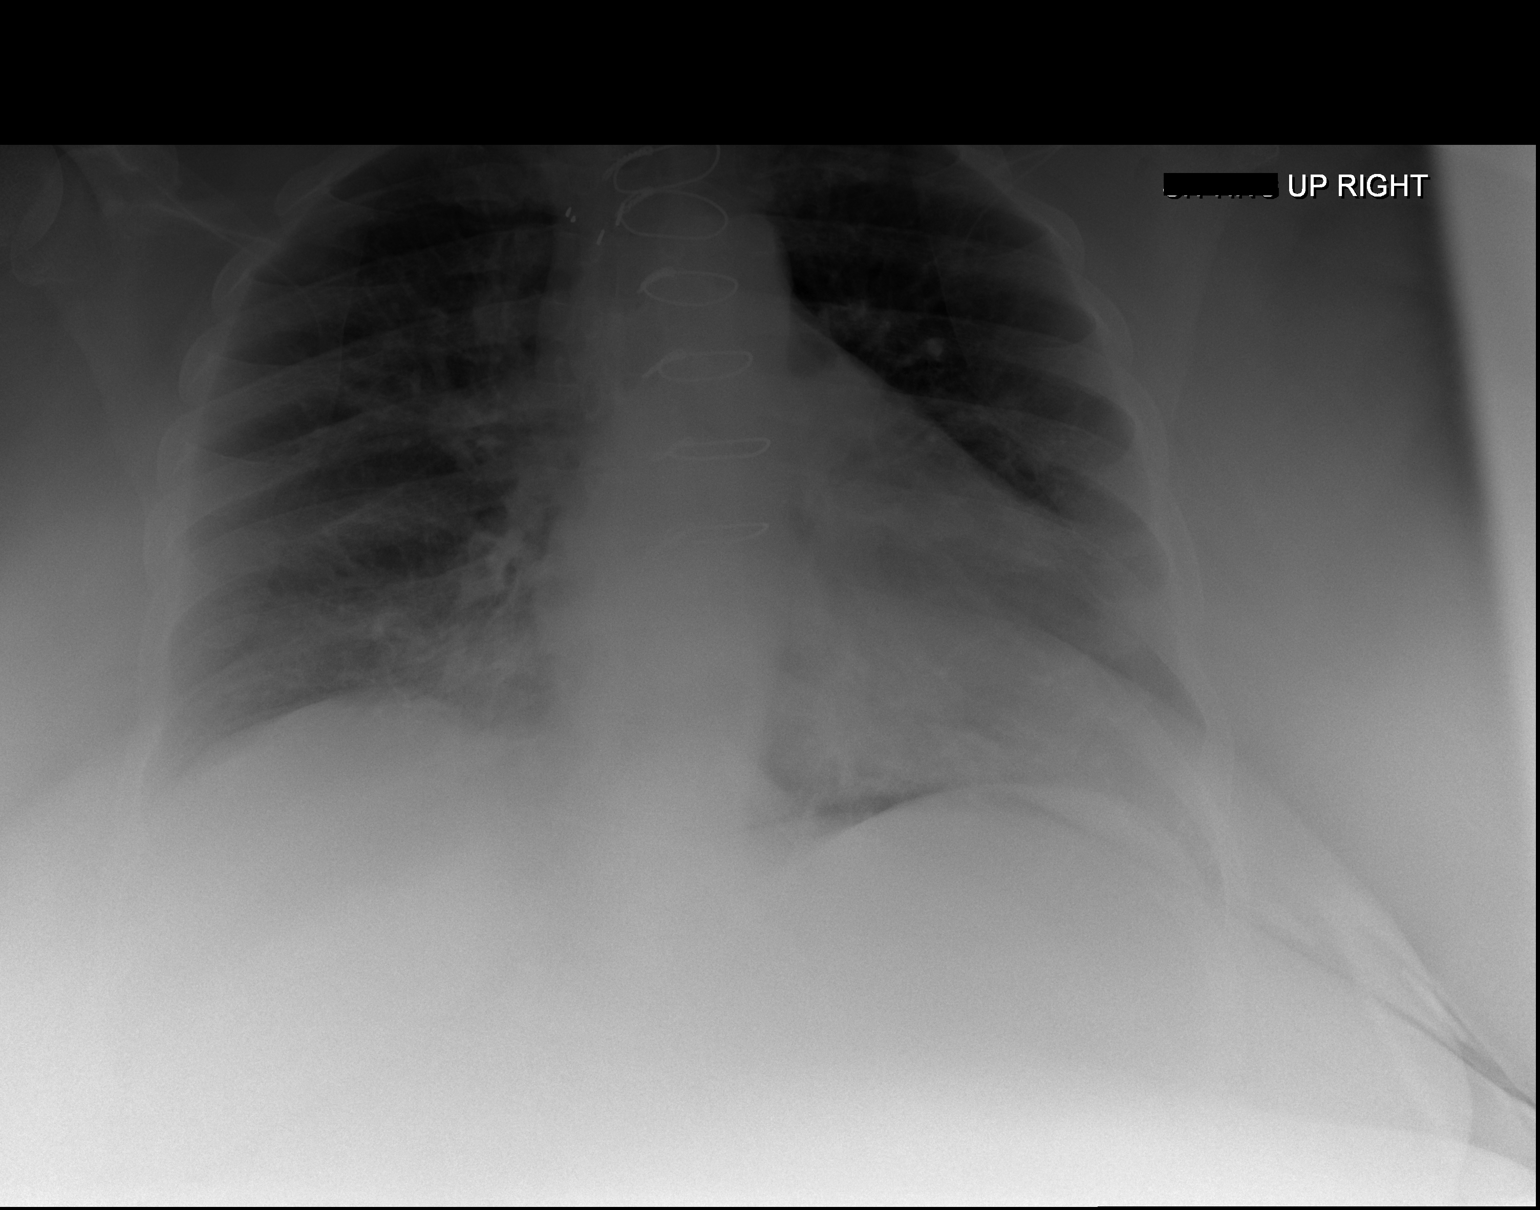

[w chest lat]
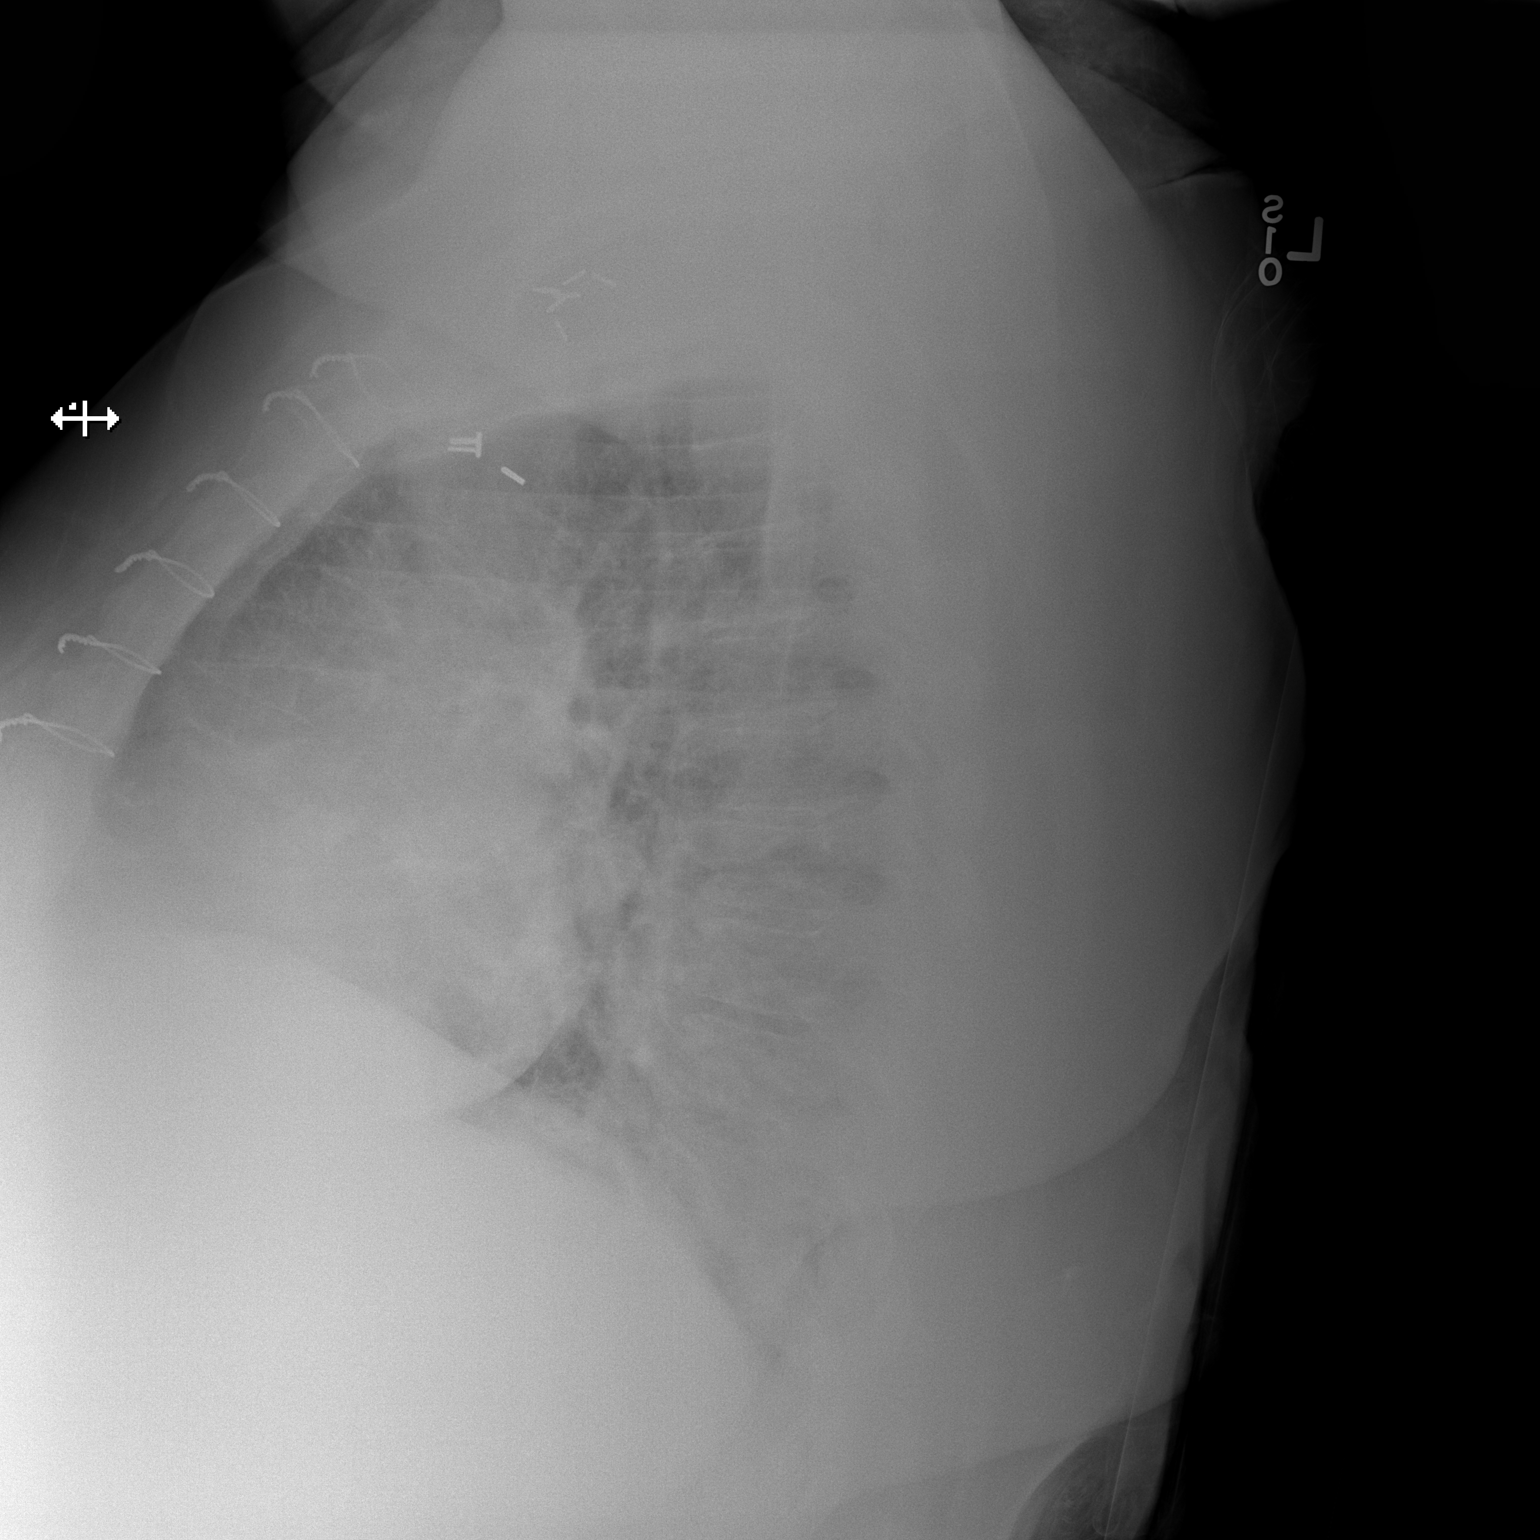

[2 of 2 positions shown; findings below may reference images not displayed]

FINDINGS: The lungs are well-aerated. Vascular congestion is noted, with mild
bilateral atelectasis. There is no evidence of pleural effusion or
pneumothorax.

The heart is borderline enlarged. The patient is status post median
sternotomy. No acute osseous abnormalities are seen.
IMPRESSION: Vascular congestion and borderline cardiomegaly, with mild bilateral
atelectasis.

## 2017-11-05 IMAGING — CT CT ABD-PELV W/ CM
3 of 13 series · 12 of 46 positions shown, 16 images · IV contrast (Omni 300)
Comparison: Chest x-ray 02/20/2015

CLINICAL DATA: Pt c/o upper midline chest pain, epigastric pain,
n/v x [AGE]ml Omni 350 given^100mL OMNIPAQUE IOHEXOL 350
MG/ML SOLN

EXAM:
CT ANGIOGRAPHY CHEST
CT ABDOMEN AND PELVIS WITH CONTRAST
TECHNIQUE: Multidetector CT imaging of the chest was performed using the
standard protocol during bolus administration of intravenous
contrast. Multiplanar CT image reconstructions and MIPs were
obtained to evaluate the vascular anatomy. Multidetector CT imaging
of the abdomen and pelvis was performed using the standard protocol
during bolus administration of intravenous contrast.
CONTRAST:  100mL OMNIPAQUE IOHEXOL 350 MG/ML SOLN

[Series 7: pe thins · axial · 0.98mm/px · z∈[-369,-192]mm · 8 of 457 slices shown]
[im 51/457  soft-tissue]
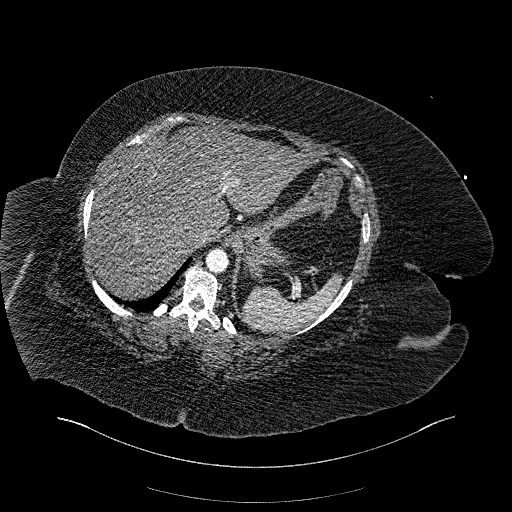
[im 102/457  soft-tissue]
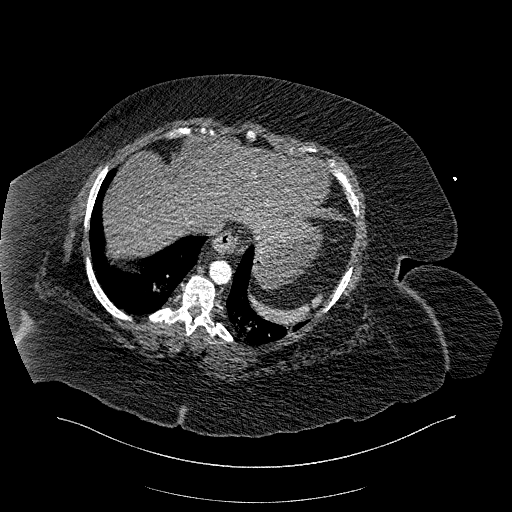
[im 153/457  soft-tissue]
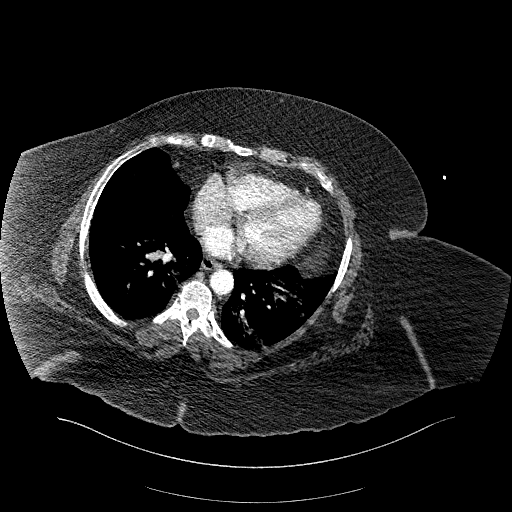
[im 203/457  soft-tissue]
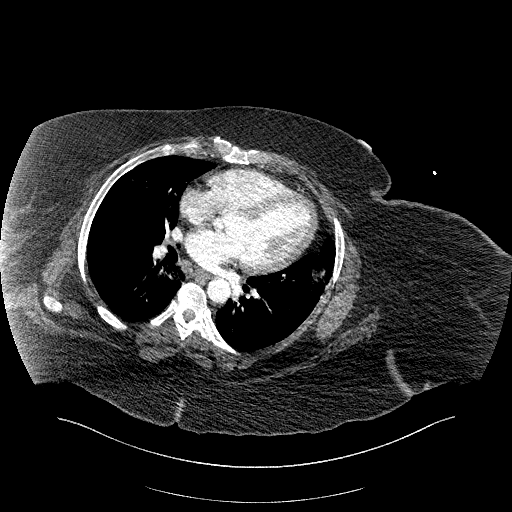
[im 254/457  soft-tissue]
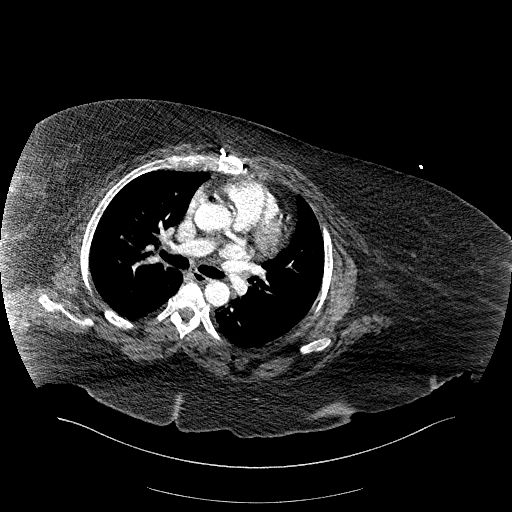
[im 305/457  soft-tissue]
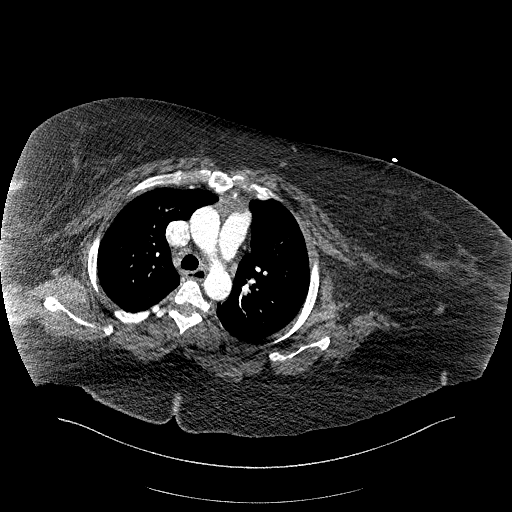
[im 355/457  soft-tissue]
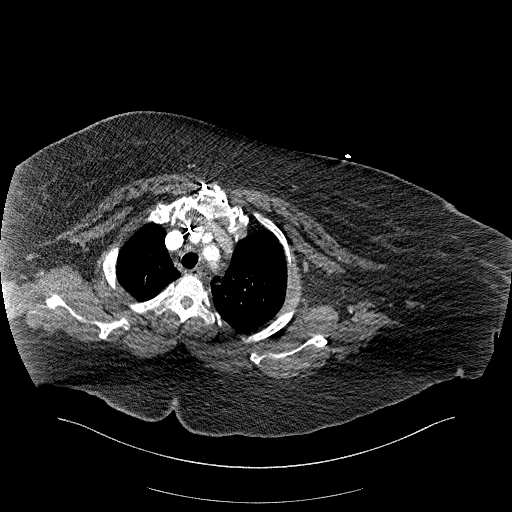
[im 406/457  soft-tissue]
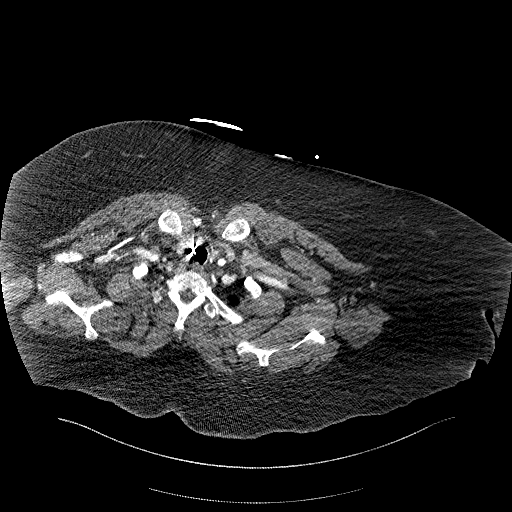

[Series 8: a/p w/ 5mm · axial · 0.98mm/px · z∈[-604,-449]mm · 2 of 94 slices shown, 5 images]
[im 32/94  soft-tissue]
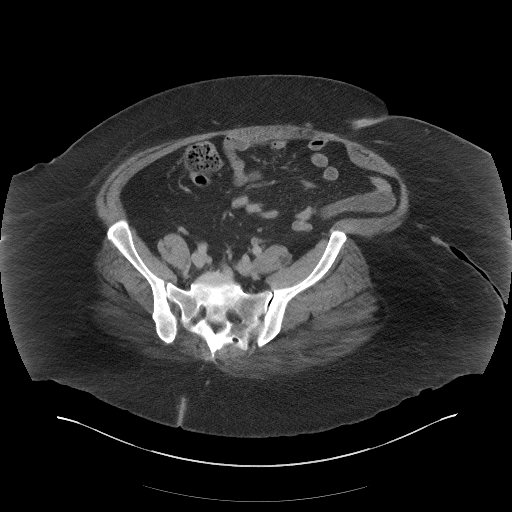
[im 32/94  lung]
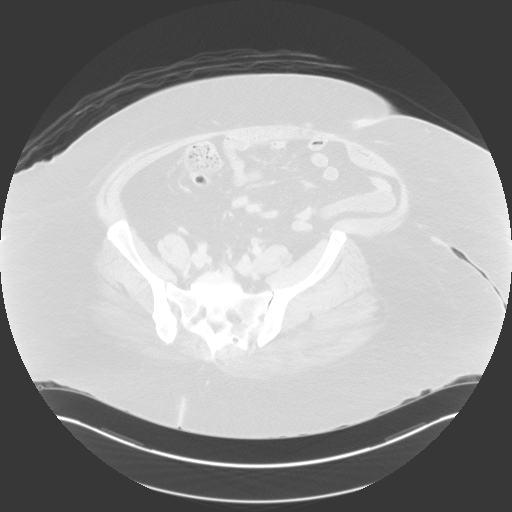
[im 32/94  bone]
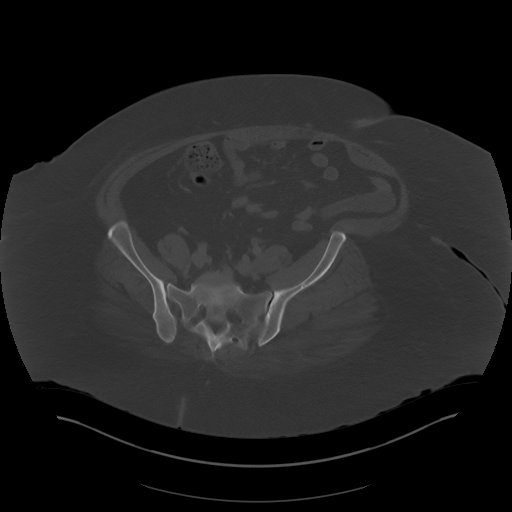
[im 63/94  soft-tissue]
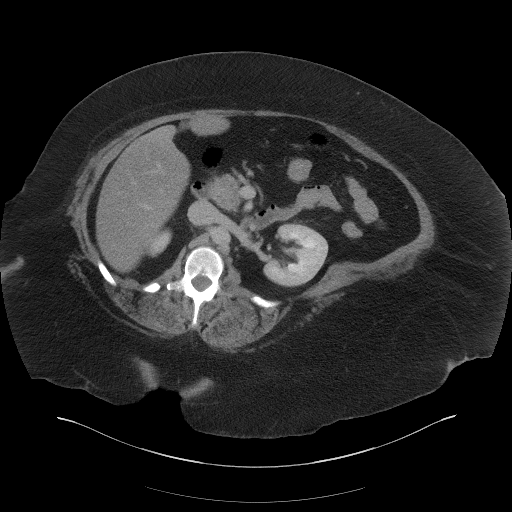
[im 63/94  lung]
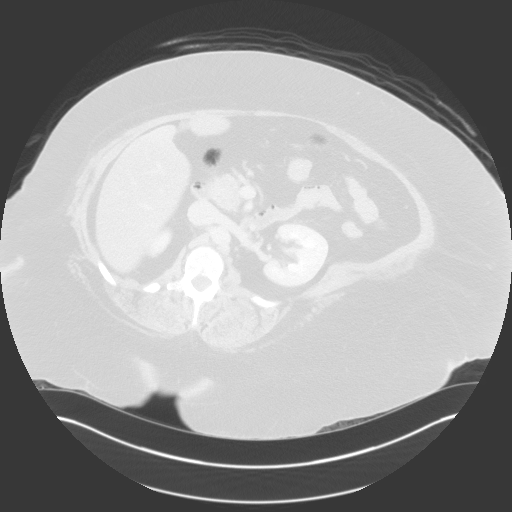

[Series 16: a/p w/ cor · coronal · 0.98mm/px · 2 of 163 slices shown, 3 images]
[im 55/163  soft-tissue]
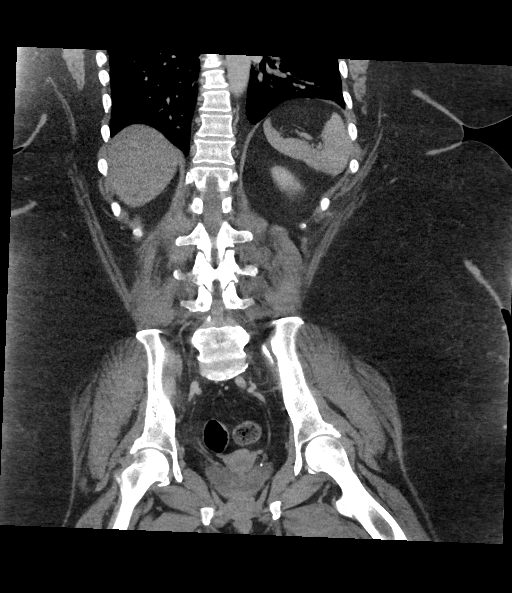
[im 55/163  bone]
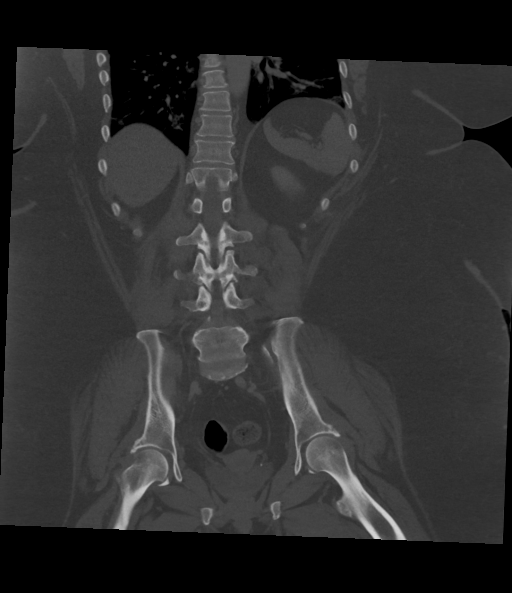
[im 109/163  soft-tissue]
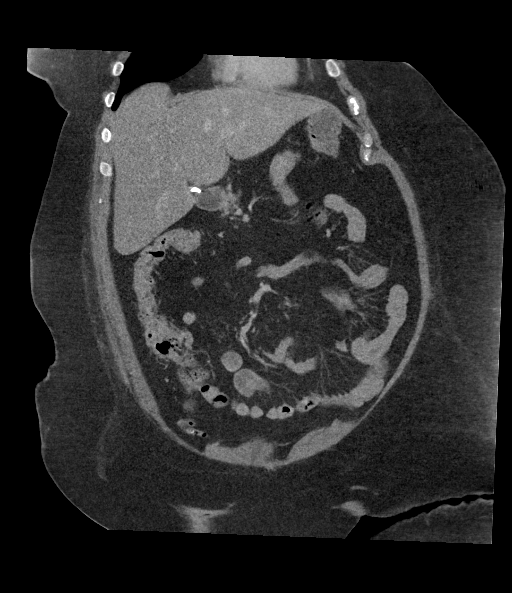

[12 of 46 positions shown; findings below may reference images not displayed]

FINDINGS: CT CHEST FINDINGS

Heart: Mild coronary artery calcifications are present. Heart size
is normal. No pericardial effusion.

Vascular structures: Pulmonary arteries are well opacified. There is
no acute pulmonary embolus.

Mediastinum/thyroid: The visualized portion of the thyroid gland has
a normal appearance. There small bilateral hilar lymph nodes,
measuring on the order of 1 cm in diameter. Pretracheal lymph node
is 1.3 cm in diameter.

Lungs/Airways: There are multiple areas of airspace filling
throughout the lungs bilaterally. Some these areas are somewhat
discrete and warrant follow-up to exclude sub solid masses. However
multifocal infectious process is favored.

Chest wall/osseous: Status post median sternotomy. No suspicious
lytic or blastic lesions are identified.

CT ABDOMEN AND PELVIS FINDINGS

Upper abdomen: There is diffuse low-attenuation of the liver
consistent with hepatic steatosis. No focal liver lesions are
identified. Gallbladder is surgically absent. No focal abnormality
identified within the spleen or pancreas, adrenal glands. Bilateral
renal cysts are present. No hydronephrosis.

Gastrointestinal tract: The stomach and small bowel loops are normal
in appearance. The appendix is well seen and has a normal
appearance. Colonic loops are normal in appearance.

Pelvis: The uterus is present. No adnexal mass. No free pelvic
fluid.

Retroperitoneum: No retroperitoneal or mesenteric adenopathy. No
evidence for aortic aneurysm.

Abdominal wall: Unremarkable.

Osseous structures: Unremarkable.

Review of the MIP images confirms the above findings.
IMPRESSION: 1. Technically adequate exam showing no acute pulmonary embolus.
2. Small, nonspecific mediastinal lymph nodes.
3. Patchy areas of airspace filling throughout the lungs
bilaterally. Findings favor multifocal infectious process. Followup
PA and lateral chest X-ray is recommended in 3-4 weeks following
trial of antibiotic therapy to ensure resolution and exclude
underlying malignancy.
4. Hepatic steatosis.
5. Normal appendix.
6. Status post cholecystectomy.

## 2017-11-08 ENCOUNTER — Ambulatory Visit
Admission: RE | Admit: 2017-11-08 | Discharge: 2017-11-08 | Disposition: A | Discharge status: Home / Self Care | Payer: Medicaid Other | Source: Ambulatory Visit | Attending: Sports Medicine | Admitting: Sports Medicine

## 2017-11-08 DIAGNOSIS — M25561 Pain in right knee: Secondary | ICD-10-CM

## 2017-11-09 ENCOUNTER — Other Ambulatory Visit: Payer: Self-pay | Admitting: Student in an Organized Health Care Education/Training Program

## 2017-11-10 ENCOUNTER — Ambulatory Visit: Payer: Self-pay | Admitting: Student in an Organized Health Care Education/Training Program

## 2017-11-11 ENCOUNTER — Encounter: Payer: Self-pay | Admitting: Student in an Organized Health Care Education/Training Program

## 2017-11-15 ENCOUNTER — Other Ambulatory Visit: Payer: Self-pay | Admitting: Student in an Organized Health Care Education/Training Program

## 2017-11-15 DIAGNOSIS — I10 Essential (primary) hypertension: Secondary | ICD-10-CM

## 2017-11-15 MED ORDER — HYDROCHLOROTHIAZIDE 25 MG PO TABS: 25.0000 mg | ORAL_TABLET | Freq: Every day | ORAL | 3 refills | Status: DC

## 2017-11-17 ENCOUNTER — Other Ambulatory Visit: Payer: Medicaid Other | Admitting: Licensed Clinical Social Worker

## 2017-11-17 DIAGNOSIS — F32A Depression, unspecified: Secondary | ICD-10-CM

## 2017-11-17 DIAGNOSIS — F329 Major depressive disorder, single episode, unspecified: Secondary | ICD-10-CM

## 2017-11-17 NOTE — Progress Notes (Signed)
LCSW did home visit with [name] to discuss LCSW leaving the clinic. LCSW provided several options for ongoing counseling. [name] expressed sadness that LCSW is leaving and stated that she trusts LCSW to refer her to a good provided. LCSW and [name] discussed counseling with a social work Teacher, adult education agreed. Social work Tax inspector to Engineer, agricultural to schedule a session.

## 2017-11-24 ENCOUNTER — Other Ambulatory Visit: Payer: Self-pay | Admitting: Internal Medicine

## 2017-11-24 DIAGNOSIS — D869 Sarcoidosis, unspecified: Secondary | ICD-10-CM

## 2017-12-01 ENCOUNTER — Ambulatory Visit: Payer: Medicaid Other | Admitting: Pharmacist

## 2017-12-01 ENCOUNTER — Other Ambulatory Visit: Payer: Self-pay | Admitting: Student in an Organized Health Care Education/Training Program

## 2017-12-01 DIAGNOSIS — E0810 Diabetes mellitus due to underlying condition with ketoacidosis without coma: Secondary | ICD-10-CM

## 2017-12-01 DIAGNOSIS — G8929 Other chronic pain: Secondary | ICD-10-CM

## 2017-12-01 MED ORDER — ACCU-CHEK AVIVA PLUS W/DEVICE KIT: 1.0000 | PACK | Freq: Once | 0 refills | Status: AC

## 2017-12-01 MED ORDER — ACCU-CHEK SOFTCLIX LANCET DEV KIT: 1.0000 | PACK | Freq: Once | 0 refills | Status: AC

## 2017-12-01 MED ORDER — METFORMIN HCL ER 500 MG PO TB24: 1000.0000 mg | ORAL_TABLET | Freq: Every day | ORAL | 11 refills | Status: DC

## 2017-12-01 MED ORDER — PREGABALIN 50 MG PO CAPS: 50.0000 mg | ORAL_CAPSULE | Freq: Two times a day (BID) | ORAL | 0 refills | Status: DC

## 2017-12-01 MED ORDER — DULAGLUTIDE 0.75 MG/0.5ML ~~LOC~~ SOAJ: 0.7500 mg | SUBCUTANEOUS | 1 refills | Status: DC

## 2017-12-01 MED ORDER — FEXOFENADINE HCL 180 MG PO TABS: 180.0000 mg | ORAL_TABLET | Freq: Every day | ORAL | 3 refills | Status: DC

## 2017-12-01 MED ORDER — GLUCOSE BLOOD VI STRP: ORAL_STRIP | 12 refills | Status: DC

## 2017-12-01 MED ORDER — ACCU-CHEK SOFT TOUCH LANCETS MISC: 12 refills | Status: DC

## 2017-12-01 NOTE — Progress Notes (Signed)
    S:     Chief Complaint  Patient presents with  . Medication Management    Diabetes    Patient arrives in good spirits, ambulating with assistance with rolling walker.  Presents for diabetes evaluation, education, and management at the request of Dr. Mosetta Putt. Patient was referred on 11/17/2017.  Patient was last seen by Primary Care Provider on 11/17/2017.   Patient reports Diabetes was diagnosed several years ago.   Insurance coverage/medication affordability: Medicaid  Patient denies adherence with evening doses of medications .  Current diabetes medications include: metformin 1000mg  BID, sitagliptan 100mg  daily Current hypertension medications include: HCTZ 25mg  daily  Patient denies hypoglycemic events.   Patient denies nocturia.  Patient reports neuropathy. Patient denies visual changes.  O:  Physical Exam  Constitutional: She appears well-developed and well-nourished.  Pulmonary/Chest: No respiratory distress.  Musculoskeletal: She exhibits no edema.  Psychiatric: She has a normal mood and affect.  Vitals reviewed.    Review of Systems  Musculoskeletal: Positive for joint pain.       Knees, hips, back, shoulder  All other systems reviewed and are negative.    Lab Results  Component Value Date   HGBA1C 9.4 (A) 10/27/2017   There were no vitals filed for this visit.  Lipid Panel     Component Value Date/Time   CHOL 178 11/27/2015 1408   TRIG 133 11/27/2015 1408   HDL 40 (L) 11/27/2015 1408   CHOLHDL 4.5 11/27/2015 1408   VLDL 27 11/27/2015 1408   LDLCALC 111 11/27/2015 1408    Patient is not checking blood glucose levels at home, does not currently have a meter but asked to have one prescribed.    A/P: Diabetes longstanding currently not well controlled. Patient is able to verbalize appropriate hypoglycemia management plan. Patient is not adherent with evening doses of medication. Control is suboptimal due to poor adherence to metformin and  suboptimal regimen. Patient does not have a glucometer at home to monitor glucose levels. Patient also complains of itching with little to no help from cetirizine.  -Initiated a trial of GLP-1 dulaglutide 0.75 mg (generic name Trulicity) to provide better blood sugar control. -Adjusted metformin formulation to extended release, now taking 1000 mg ER daily.  -Sent prescription for glucometer for patient to monitor blood glucose levels at home.  -Discontinued cetirizine, started fexofenadine 180mg  daily to provide better urticaria relief.  -Counseled on s/sx of and management of hypoglycemia  Chronic pain - discussed option for starting Lyrica with in this patient on chronic narcotic.  New Rx per Dr. 13/02/2015.  Written patient instructions provided.  Total time in face to face counseling 60 minutes.   Follow up Pharmacist/PCP Dr. 13/02/2015 Clinic Visit in 4-6 weeks.   Patient seen with 13/02/2015, PharmD, PGY1 Pharmacy Resident .

## 2017-12-01 NOTE — Progress Notes (Signed)
Patient seen by Dr. Raymondo Band in pharmacy clinic today. We discussed adding lyrica to her chronic pain regimen. Ordered.

## 2017-12-01 NOTE — Patient Instructions (Signed)
Great to see you today.   Inject Trulicity once weekly.   Metformin 2 pills in the AM of the new XR/ER.   Stop Cetirizine and replace with fexofenadine once daily in the AM.   Start Lyrica now 50mg  twice daily.   Follow up with Rx clinic in 4-6 weeks.

## 2017-12-01 NOTE — Assessment & Plan Note (Signed)
Diabetes longstanding currently not well controlled. Patient is able to verbalize appropriate hypoglycemia management plan. Patient is not adherent with evening doses of medication. Control is suboptimal due to poor adherence to metformin and suboptimal regimen. Patient does not have a glucometer at home to monitor glucose levels. Patient also complains of itching with little to no help from cetirizine.  -Initiated a trial of GLP-1 dulaglutide 0.75 mg (generic name Trulicity) to provide better blood sugar control. -Adjusted metformin formulation to extended release, now taking 1000 mg ER daily.  -Sent prescription for glucometer for patient to monitor blood glucose levels at home.  -Discontinued cetirizine, started fexofenadine 180mg  daily to provide better urticaria relief.  -Counseled on s/sx of and management of hypoglycemia

## 2017-12-02 NOTE — Progress Notes (Signed)
Patient ID: [name], female   DOB: [birth year], 52 y.o.   MRN: [id number] REviewed: Agree with Dr. Macky Lower documentation and management.

## 2017-12-05 ENCOUNTER — Telehealth: Payer: Self-pay | Admitting: *Deleted

## 2017-12-05 ENCOUNTER — Telehealth: Payer: Self-pay

## 2017-12-05 NOTE — Telephone Encounter (Signed)
[facility] requesting prior authorization of pregabalin .  Form placed in MD's box for completion along with Medicaid formulary.  Quashaun Lazalde, Maryjo [place], CMA

## 2017-12-05 NOTE — Telephone Encounter (Signed)
Orders from [employer] supplies in mailbox to complete and fax for patient.  Ples Specter, RN [facility] RN)

## 2017-12-05 NOTE — Telephone Encounter (Signed)
[facility] prior authorization of Fexofenadine .  Form placed in MD's box for completion along with Medicaid formulary.  Daralyn Bert, Maryjo [place], CMA

## 2017-12-05 NOTE — Telephone Encounter (Signed)
[facility] trulicity .  Form placed in MD's box for completion along with Medicaid formulary.  Lorenia Hoston, Maryjo [place], CMA

## 2017-12-07 ENCOUNTER — Telehealth: Payer: Self-pay | Admitting: Internal Medicine

## 2017-12-07 NOTE — Telephone Encounter (Signed)
Social work Magazine features editor and left a message about the possibility of scheduling a home-visit. [name] texted back and said that after Thanksgiving would be better for her. We agreed to try meeting the first week of December.

## 2017-12-08 NOTE — Telephone Encounter (Signed)
Fexofenadine approved 12/08/17 - 12/08/18. Prior approval #[id number]. [facility] notified. Ples Specter, RN [facility] RN)

## 2017-12-08 NOTE — Telephone Encounter (Signed)
Pregabalin 50 mg approved 12/08/17 - 12/08/18. Prior approval # [id number]. [facility] informed. Ples Specter, RN [facility] RN)

## 2017-12-09 ENCOUNTER — Other Ambulatory Visit: Payer: Self-pay | Admitting: Student in an Organized Health Care Education/Training Program

## 2017-12-09 DIAGNOSIS — G894 Chronic pain syndrome: Secondary | ICD-10-CM

## 2017-12-09 DIAGNOSIS — I10 Essential (primary) hypertension: Secondary | ICD-10-CM

## 2017-12-09 DIAGNOSIS — M25561 Pain in right knee: Secondary | ICD-10-CM

## 2017-12-09 NOTE — Telephone Encounter (Signed)
Completed and vfaxed

## 2017-12-09 NOTE — Telephone Encounter (Signed)
Completed/placed in nurse box

## 2017-12-13 ENCOUNTER — Encounter: Payer: Self-pay | Admitting: Student in an Organized Health Care Education/Training Program

## 2017-12-13 ENCOUNTER — Other Ambulatory Visit: Payer: Self-pay | Admitting: Student in an Organized Health Care Education/Training Program

## 2017-12-13 NOTE — Telephone Encounter (Signed)
This is back in your box. She has to have failed two of the preferred alternatives. The alternatives are with the paperwork. Thanks. Ples Specter, RN [facility] RN)

## 2017-12-14 ENCOUNTER — Other Ambulatory Visit: Payer: Self-pay | Admitting: Student in an Organized Health Care Education/Training Program

## 2017-12-14 DIAGNOSIS — E0810 Diabetes mellitus due to underlying condition with ketoacidosis without coma: Secondary | ICD-10-CM

## 2017-12-14 MED ORDER — ACCU-CHEK AVIVA PLUS W/DEVICE KIT: 1.0000 | PACK | Freq: Three times a day (TID) | 0 refills | Status: DC

## 2017-12-15 NOTE — Telephone Encounter (Signed)
Prior approval for Trulicity completed via [place] Tr[facility].  Med approved for 12/15/17 - 12/15/18  Prior approval # [id number].  [facility] informed.  Nigel Mormon, RN

## 2017-12-16 ENCOUNTER — Other Ambulatory Visit: Payer: Self-pay | Admitting: Internal Medicine

## 2017-12-16 DIAGNOSIS — D869 Sarcoidosis, unspecified: Secondary | ICD-10-CM

## 2018-01-02 ENCOUNTER — Encounter: Payer: Self-pay | Admitting: Student in an Organized Health Care Education/Training Program

## 2018-01-03 ENCOUNTER — Other Ambulatory Visit: Payer: Self-pay | Admitting: Family Medicine

## 2018-01-04 ENCOUNTER — Other Ambulatory Visit: Payer: Self-pay | Admitting: Student in an Organized Health Care Education/Training Program

## 2018-01-05 ENCOUNTER — Ambulatory Visit: Payer: Medicaid Other | Admitting: Pharmacist

## 2018-01-05 ENCOUNTER — Other Ambulatory Visit: Payer: Self-pay | Admitting: Student in an Organized Health Care Education/Training Program

## 2018-01-05 DIAGNOSIS — G894 Chronic pain syndrome: Secondary | ICD-10-CM

## 2018-01-05 DIAGNOSIS — E0810 Diabetes mellitus due to underlying condition with ketoacidosis without coma: Secondary | ICD-10-CM | POA: Diagnosis not present

## 2018-01-05 DIAGNOSIS — M25561 Pain in right knee: Secondary | ICD-10-CM

## 2018-01-05 MED ORDER — POLYETHYLENE GLYCOL 3350 17 G PO PACK: 17.0000 g | PACK | Freq: Every day | ORAL | 5 refills | Status: DC | PRN

## 2018-01-05 MED ORDER — ACCU-CHEK AVIVA PLUS W/DEVICE KIT: 1.0000 | PACK | Freq: Once | 0 refills | Status: AC

## 2018-01-05 MED ORDER — METFORMIN HCL ER 500 MG PO TB24: 2000.0000 mg | ORAL_TABLET | Freq: Every day | ORAL | 11 refills | Status: DC

## 2018-01-05 NOTE — Assessment & Plan Note (Signed)
Diabetes longstanding with improved control since starting Trulicity based only on decreased nocturia due to inability to obtain a new glucometer.  States she got the meter supplies and lancets. Patient is able to verbalize appropriate hypoglycemia management plan. Patient is adherent with medication. Control is suboptimal due to sedentary lifestyle/ knee pain.  -Continued GLP-1 Trulicity (generic name dulaglutide) at same dose due to increased diarrhea (denied nausea or vomiting).  STOPPED Januvia since she is tolerating and using Trulicity.   -Discussed  SGLT2-I potential use in the future.

## 2018-01-05 NOTE — Progress Notes (Signed)
S:     Chief Complaint  Patient presents with  . Medication Management    Diabetes    Patient arrives in good spirits accompanied by her son and riding in a wheel chair.  She notes her walker broke on the way into the building.  She reports her knees are very painful today 10/10 pain.   Presents for diabetes evaluation, education, and management at the request of Dr Mosetta Putt. Patient was referred and last seen by PCP 10/27/2017.    Insurance coverage/medication affordability: Medicaid - still needed blood glucose meter - meter prescription was not sent yet to pharmacy per patient report.   Patient reports adherence with medications.  Current diabetes medications include: Trulicity, Jardiance Metformin   Current hypertension medications include: clonidine and HCTZ.  Reports increased use of sulindac since increase in pain.   Patient denies hypoglycemic events. Reports eating small amounts of food.   Patient-reported exercise habits: Limited by Knee pain.    Patient reports decreased nocturia.     O:  Physical Exam Vitals signs reviewed.  Constitutional:      Appearance: Normal appearance. She is obese.  Pulmonary:     Effort: Pulmonary effort is normal.  Neurological:     Mental Status: She is alert.  Psychiatric:        Mood and Affect: Mood normal.      Review of Systems  Respiratory: Negative for shortness of breath.   Gastrointestinal: Positive for constipation.  Musculoskeletal: Positive for joint pain.  All other systems reviewed and are negative.    Lab Results  Component Value Date   HGBA1C 9.4 (A) 10/27/2017   Vitals:   01/05/18 1542  BP: (!) 152/98  Pulse: 97  SpO2: 98%    Lipid Panel     Clinical ASCVD: No  The 10-year ASCVD risk score Denman George DC Jr., et al., 2013) is: 21.8%   Values used to calculate the score:     Age: 52 years     Sex: Female     Is Non-Hispanic African American: Yes     Diabetic: Yes     Tobacco smoker: No  Systolic Blood Pressure: 152 mmHg     Is BP treated: Yes     HDL Cholesterol: 40 mg/dL     Total Cholesterol: 178 mg/dL    A/P: Diabetes longstanding with improved control since starting Trulicity based only on decreased nocturia due to inability to obtain a new glucometer.  States she got the meter supplies and lancets. Patient is able to verbalize appropriate hypoglycemia management plan. Patient is adherent with medication. Control is suboptimal due to sedentary lifestyle/ knee pain.  -Continued GLP-1 Trulicity (generic name dulaglutide) at same dose due to increased diarrhea (denied nausea or vomiting).  STOPPED Januvia since she is tolerating and using Trulicity.   -Discussed  SGLT2-I potential use in the future.  -Extensively discussed pathophysiology of DM, recommended lifestyle interventions, dietary effects on glycemic control -Counseled on s/sx of and management of hypoglycemia -Next A1C anticipated at PCP visit in near future.   Hypertension longstanding and well above goal today most likely related to poor pain control day with pain rating of 10/10 (however she was smiling and NOT grimicing).  Strongly consider additional medication at next visit  - SGLT2 inhibitor may help with BP as well.   Itching continues despite switch to fexofenadine however patient notices less sedation with fexofenadine.  No change in this medication today.   Written patient instructions provided.  Total time in face to face counseling 30 minutes.   Follow up PCP next visit then return in early 2020 to Rx Clinic Visit (after PCP visit)

## 2018-01-05 NOTE — Patient Instructions (Signed)
Great to see you today.   Stop Januvia  Continue Trulicity same dose once weekly.   Increase your Metformin gradually to 4 tablets daily.   Consider restarting Miralax for constipation.   Follow up - next visit with Dr. Mosetta Putt

## 2018-01-08 ENCOUNTER — Other Ambulatory Visit: Payer: Self-pay | Admitting: Student in an Organized Health Care Education/Training Program

## 2018-01-09 NOTE — Progress Notes (Signed)
Patient ID: [name], female   DOB: August 17, 1965, 52 y.o.   MRN: [id number] Reviewed: Agree with the documentation and management of Dr. Raymondo Band.

## 2018-01-16 ENCOUNTER — Other Ambulatory Visit: Payer: Self-pay | Admitting: *Deleted

## 2018-01-16 DIAGNOSIS — G8929 Other chronic pain: Secondary | ICD-10-CM

## 2018-01-17 MED ORDER — PREGABALIN 50 MG PO CAPS: 50.0000 mg | ORAL_CAPSULE | Freq: Two times a day (BID) | ORAL | 0 refills | Status: DC

## 2018-01-17 NOTE — Telephone Encounter (Incomplete)
I am covering Dr. Latanya Maudlin inbox. I will provide 1 month refill.

## 2018-01-31 ENCOUNTER — Ambulatory Visit: Payer: Medicaid Other | Admitting: Student in an Organized Health Care Education/Training Program

## 2018-01-31 VITALS — BP 119/80 | HR 98 | Temp 98.5°F | Wt 327.0 lb

## 2018-01-31 DIAGNOSIS — E0810 Diabetes mellitus due to underlying condition with ketoacidosis without coma: Secondary | ICD-10-CM | POA: Diagnosis present

## 2018-01-31 DIAGNOSIS — I10 Essential (primary) hypertension: Secondary | ICD-10-CM | POA: Diagnosis not present

## 2018-01-31 DIAGNOSIS — J45909 Unspecified asthma, uncomplicated: Secondary | ICD-10-CM

## 2018-01-31 DIAGNOSIS — G8929 Other chronic pain: Secondary | ICD-10-CM | POA: Diagnosis not present

## 2018-01-31 LAB — POCT GLYCOSYLATED HEMOGLOBIN (HGB A1C): HbA1c, POC (controlled diabetic range): 8.3 % — AB (ref 0.0–7.0)

## 2018-01-31 MED ORDER — COMMODE 3-IN-1 MISC: 1.0000 | Freq: Once | 0 refills | Status: AC

## 2018-01-31 MED ORDER — PREGABALIN 50 MG PO CAPS: 50.0000 mg | ORAL_CAPSULE | Freq: Two times a day (BID) | ORAL | 0 refills | Status: DC

## 2018-01-31 MED ORDER — CLONIDINE HCL 0.1 MG PO TABS: ORAL_TABLET | ORAL | 0 refills | Status: DC

## 2018-01-31 MED ORDER — BUDESONIDE-FORMOTEROL FUMARATE 160-4.5 MCG/ACT IN AERO: 2.0000 | INHALATION_SPRAY | Freq: Two times a day (BID) | RESPIRATORY_TRACT | 3 refills | Status: DC

## 2018-01-31 MED ORDER — OXYCODONE HCL 5 MG PO TABS: 5.0000 mg | ORAL_TABLET | Freq: Three times a day (TID) | ORAL | 0 refills | Status: DC | PRN

## 2018-01-31 NOTE — Patient Instructions (Signed)
It was a pleasure seeing you today in our clinic.    I will call you with the results of your A1c test.  Our clinic's number is [phone]. Please call with questions or concerns about what we discussed today.  Be well, Dr. Mosetta Putt

## 2018-02-02 NOTE — Progress Notes (Signed)
CC: Medication refills  HPI: [name] is a 53 y.o. female  Medication refills for chronic pain_: patient never feels that pain is very well controlled. She takes sulindac despite risks with long-term NSAID use which have been discussed with her on multiple occasions. She takes oxycodone 5 mg q8H PRN severe pain, but she pretty much takes this as a scheduled medicine. She has had attempts at weight lossi n the past but feels she needs to get into the swimming pool to exercise and she cannot afford a membership. She does report some improvement in symptoms with the addition of lyrica at a recent pharmacy visit.  Need for 3-in-1 commode: Patient reports that she would benefit from a shower seat due to risk for falling in the shower and asks if I can write for a 3-1 shower seat and bedside commode.  Diabetes: Hemoglobin A1c 8.3 from 9.4 previously. She has not noted low blood sugars. She endorses 100% compliance on her current medication regimen with trulicity and metformin and [place]. She denies polyuria, polyphagia, polydipsia.  Review of Symptoms:  See HPI for ROS.   CC, SH/smoking status, and VS noted.  Objective: BP 119/80   Pulse 98   Temp 98.5 F (36.9 C) (Oral)   Wt (!) 327 lb (148.3 kg)   SpO2 98%   BMI 56.13 kg/m  GEN: NAD, alert, cooperative, and pleasant, chronically-ill appearing obese female with walker RESPIRATORY: Comfortable work of breathing, speaks in full sentences CV: Regular rate noted, distal extremities well perfused and warm without edema GI: Soft, nondistended SKIN: warm and dry, no rashes or lesions NEURO: II-XII grossly intact MSK: Moves 4 extremities equally PSYCH: AAOx3, appropriate affect    Assessment and plan:  Encounter for chronic pain management Refilled oxycodone at previous # of pills. Will continue to decrease # of pills by 3 pills at next refill. (So prescribe #81 per month at next refill). Continue lyrica. NSAID continued, shared  decision making.  Diabetes (HCC) Improving A1c. Continue current management. Discuss ASA for primary prevention at next visit.   DME 3-in-1 shower bench/bedside commode paper script provided.  Orders Placed This Encounter  Procedures  . HgB A1c    Meds ordered this encounter  Medications  . cloNIDine (CATAPRES) 0.1 MG tablet    Sig: Take three times daily only if patch falls off.    Dispense:  30 tablet    Refill:  0  . DISCONTD: oxyCODONE (OXY IR/ROXICODONE) 5 MG immediate release tablet    Sig: Take 1 tablet (5 mg total) by mouth every 8 (eight) hours as needed for severe pain.    Dispense:  84 tablet    Refill:  0    To be filled 01/31/2017 or later  . DISCONTD: oxyCODONE (OXY IR/ROXICODONE) 5 MG immediate release tablet    Sig: Take 1 tablet (5 mg total) by mouth every 8 (eight) hours as needed for severe pain.    Dispense:  84 tablet    Refill:  0    To be filled 03/03/2017 or later  . oxyCODONE (OXY IR/ROXICODONE) 5 MG immediate release tablet    Sig: Take 1 tablet (5 mg total) by mouth every 8 (eight) hours as needed for severe pain.    Dispense:  84 tablet    Refill:  0    To be filled 03/31/2017 or later  . Misc. Devices (COMMODE 3-IN-1) MISC    Sig: 1 each by Does not apply route once for 1  dose.    Dispense:  1 each    Refill:  0  . budesonide-formoterol (SYMBICORT) 160-4.5 MCG/ACT inhaler    Sig: Inhale 2 puffs into the lungs 2 (two) times daily.    Dispense:  1 Inhaler    Refill:  3    Order Specific Question:   Lot Number?    Answer:   [id number]    Order Specific Question:   Expiration Date?    Answer:   08/23/2017    Order Specific Question:   Manufacturer?    Answer:   [employer] [[id number]]    Order Specific Question:   Quantity    Answer:   1  . pregabalin (LYRICA) 50 MG capsule    Sig: Take 1 capsule (50 mg total) by mouth 2 (two) times daily.    Dispense:  60 capsule    Refill:  0     Howard PouchLauren Charis Juliana, MD,MS,  PGY3 02/02/2018 4:40 PM

## 2018-02-02 NOTE — Assessment & Plan Note (Signed)
Improving A1c. Continue current management. Discuss ASA for primary prevention at next visit.

## 2018-02-02 NOTE — Assessment & Plan Note (Signed)
Refilled oxycodone at previous # of pills. Will continue to decrease # of pills by 3 pills at next refill. (So prescribe #81 per month at next refill). Continue lyrica. NSAID continued, shared decision making.

## 2018-02-06 ENCOUNTER — Other Ambulatory Visit: Payer: Self-pay | Admitting: Student in an Organized Health Care Education/Training Program

## 2018-02-06 DIAGNOSIS — E0810 Diabetes mellitus due to underlying condition with ketoacidosis without coma: Secondary | ICD-10-CM

## 2018-02-08 ENCOUNTER — Other Ambulatory Visit: Payer: Self-pay | Admitting: Student in an Organized Health Care Education/Training Program

## 2018-02-08 DIAGNOSIS — E0810 Diabetes mellitus due to underlying condition with ketoacidosis without coma: Secondary | ICD-10-CM

## 2018-02-13 ENCOUNTER — Other Ambulatory Visit: Payer: Self-pay | Admitting: Family Medicine

## 2018-02-13 DIAGNOSIS — E0810 Diabetes mellitus due to underlying condition with ketoacidosis without coma: Secondary | ICD-10-CM

## 2018-02-26 ENCOUNTER — Other Ambulatory Visit: Payer: Self-pay | Admitting: Student in an Organized Health Care Education/Training Program

## 2018-02-26 DIAGNOSIS — G894 Chronic pain syndrome: Secondary | ICD-10-CM

## 2018-02-26 DIAGNOSIS — M25561 Pain in right knee: Secondary | ICD-10-CM

## 2018-04-17 ENCOUNTER — Other Ambulatory Visit: Payer: Self-pay | Admitting: Family Medicine

## 2018-04-17 ENCOUNTER — Other Ambulatory Visit: Payer: Self-pay | Admitting: Student in an Organized Health Care Education/Training Program

## 2018-04-17 DIAGNOSIS — E0810 Diabetes mellitus due to underlying condition with ketoacidosis without coma: Secondary | ICD-10-CM

## 2018-04-17 DIAGNOSIS — G8929 Other chronic pain: Secondary | ICD-10-CM

## 2018-05-09 ENCOUNTER — Encounter: Payer: Self-pay | Admitting: Student in an Organized Health Care Education/Training Program

## 2018-05-14 ENCOUNTER — Other Ambulatory Visit: Payer: Self-pay | Admitting: Student in an Organized Health Care Education/Training Program

## 2018-05-14 DIAGNOSIS — G8929 Other chronic pain: Secondary | ICD-10-CM

## 2018-05-14 MED ORDER — OXYCODONE HCL 5 MG PO TABS: 5.0000 mg | ORAL_TABLET | Freq: Three times a day (TID) | ORAL | 0 refills | Status: DC | PRN

## 2018-05-14 NOTE — Progress Notes (Signed)
Refilling pain regimen x1 month to prevent patient from having to come into the office during COVID19.

## 2018-05-15 ENCOUNTER — Telehealth: Payer: Self-pay | Admitting: *Deleted

## 2018-05-15 DIAGNOSIS — G8929 Other chronic pain: Secondary | ICD-10-CM

## 2018-05-15 NOTE — Telephone Encounter (Signed)
Completed PA info in [place] [employer] for oxycodone.  Status pending.  Will recheck status in 24 hours. Rebeka Kimble, CMA   

## 2018-05-15 NOTE — Telephone Encounter (Signed)
Med approved for 11/11/18  Prior approval # [id number].  [facility] pharmacy informed.  Jone Baseman, CMA

## 2018-05-17 ENCOUNTER — Other Ambulatory Visit: Payer: Self-pay | Admitting: Student in an Organized Health Care Education/Training Program

## 2018-05-17 DIAGNOSIS — G8929 Other chronic pain: Secondary | ICD-10-CM

## 2018-06-01 ENCOUNTER — Telehealth: Payer: Self-pay | Admitting: *Deleted

## 2018-06-01 NOTE — Telephone Encounter (Signed)
Contacted pt to see how she was doing, said she was doing ok.  Informed her that we are currently setting up in person visits and that if she needs to be seen she can call our office and make an appointment at her convenience.[name], CMA

## 2018-06-20 ENCOUNTER — Other Ambulatory Visit: Payer: Self-pay | Admitting: Family Medicine

## 2018-06-20 ENCOUNTER — Other Ambulatory Visit: Payer: Self-pay | Admitting: Student in an Organized Health Care Education/Training Program

## 2018-06-20 DIAGNOSIS — G8929 Other chronic pain: Secondary | ICD-10-CM

## 2018-06-20 DIAGNOSIS — I10 Essential (primary) hypertension: Secondary | ICD-10-CM

## 2018-06-22 MED ORDER — CLONIDINE HCL 0.1 MG PO TABS: ORAL_TABLET | ORAL | 0 refills | Status: DC

## 2018-06-22 MED ORDER — PREGABALIN 50 MG PO CAPS: 50.0000 mg | ORAL_CAPSULE | Freq: Two times a day (BID) | ORAL | 0 refills | Status: DC

## 2018-06-22 MED ORDER — ACCU-CHEK AVIVA PLUS W/DEVICE KIT: 1.0000 | PACK | Freq: Three times a day (TID) | 0 refills | Status: DC

## 2018-06-22 MED ORDER — OXYCODONE HCL 5 MG PO TABS: 5.0000 mg | ORAL_TABLET | Freq: Three times a day (TID) | ORAL | 0 refills | Status: DC | PRN

## 2018-07-25 ENCOUNTER — Other Ambulatory Visit: Payer: Self-pay | Admitting: Family Medicine

## 2018-07-25 DIAGNOSIS — G8929 Other chronic pain: Secondary | ICD-10-CM

## 2018-08-19 ENCOUNTER — Other Ambulatory Visit: Payer: Self-pay | Admitting: Student in an Organized Health Care Education/Training Program

## 2018-08-19 DIAGNOSIS — E0810 Diabetes mellitus due to underlying condition with ketoacidosis without coma: Secondary | ICD-10-CM

## 2018-08-21 NOTE — Telephone Encounter (Signed)
Please call patient and ask that she follow up for diabetes. She has not been seen since January 2020. Would like to recheck A1C.

## 2018-08-22 NOTE — Telephone Encounter (Signed)
Contacted pt and gave her the below information and schedule her an appointment with Dr. [name] on 09/04/2018 @ 4pm.  While on the phone she asked that I send a message to Dr. Valentina Lucks, she is requesting him to give her a call. April Zimmerman Rumple, CMA

## 2018-08-28 NOTE — Telephone Encounter (Signed)
Patient contacted in follow-up of phone call request last week.  I was on vacation and apologized for the delay in response.   [name] reports blood sugars in the 300s and also reports nausea (sick on her stomach) with the metformin and requests a new option.   She denied diarrhea.   She would like to stop the metformin.   I agreed with the plan to do a trial off metformin to assess if metformin was the cause or if she possibly had "gatroparesis" symptoms.     She would like to continue taking Trulicity, we discussed increasing the dose (next/top dose is 1.5mg  weekly).   She denied ever discussing or taking SGLT2 tx when all three were listed.   She expressed a willingness to consider taking a shot of insulin with a "traditional insulin PEN".    Next visit with PCP in 1 week.  I will try to discuss/develop patient tx plan with PCP, Dr. Zettie Cooley, at that time.

## 2018-09-04 ENCOUNTER — Ambulatory Visit: Payer: Medicaid Other | Admitting: Family Medicine

## 2018-09-08 ENCOUNTER — Other Ambulatory Visit: Payer: Self-pay | Admitting: Student in an Organized Health Care Education/Training Program

## 2018-09-08 DIAGNOSIS — G8929 Other chronic pain: Secondary | ICD-10-CM

## 2018-09-12 NOTE — Patient Instructions (Addendum)
Nice to see you today!  Start Insulin 10 units daily and increase 2 units daily if CBG > 631 Start Trulicity 1.5 mg weekly - every Sunday Start Amlodipine 5 mg daily at bedtime  Refilled Clonidine and  Furosemide.  Eat more vegetables, fruits, and protein. Eat less grapes, melons and carbs (rice, bread, mac & cheese)

## 2018-09-12 NOTE — Progress Notes (Signed)
Referral - [name] (PCP)  Dr Burr Medico. Patient was referred 10/27/2017 for DM management and last seen 01/31/18; decreasing opioid dose  Last appt w Dr. Valentina Lucks:  1. DM: continued trulicity at current dose (can't titrate higher d/t AE), D/C januvia, discussed SGLT2i in the future  1. HTN: clonidine patch 0.3 mg/week (rx for PO clonidine if patch falls off), HCTZ 25 mg daily; consider adding another agent  Current DM meds:metformin 6160 mg/day, trulicity 7.37 mg once weekly Current HTN meds: clonidine patch 0.3 mg/week, furosemide 20 mg prn, HCTZ 25 mg daily  Prior DM meds: Prior HTN meds: lisinopril/HCTZ (lisinopril D/C due to COPD/asthma in 2018; losartan was recommended but never intiiated)  HbA1c 8.3 (01/2018; dec from 9.4 (10/2017)) Last K and Scr stable in 2019; repeat BMP eGFR stable   **Pt complains of uncontrolled pain   Recommend 1. Need microalbumin/creatinine ratio lab results, updated HbA1c, CMP 2. DM   Continue metformin, trulicity  Empagliflozin 10 mg daily (covered by medicaid); double check hx of UTI/genital infections, eGFR good  Initiate rosuvastatin 20 mg daily (ASCVD risk >20%)  Initiate aspirin 81 mg daily  3. HTN   Continue clonidine, HCTZ, furosemide (? Is she using this???)  If microalbumin elevated consider losartan/HCTZ (covered by medicaid) 50/12.5 (do not want to do 25 HCTZ combo formulation since this would start her at 100 mg  losartan); if microalbumin is not elevated consider increasing to HCTZ 50 mg or amlodipine 5 mg daily

## 2018-09-14 ENCOUNTER — Other Ambulatory Visit: Payer: Self-pay

## 2018-09-14 ENCOUNTER — Encounter: Payer: Self-pay | Admitting: Pharmacist

## 2018-09-14 ENCOUNTER — Ambulatory Visit: Payer: Medicaid Other | Admitting: Pharmacist

## 2018-09-14 DIAGNOSIS — E0810 Diabetes mellitus due to underlying condition with ketoacidosis without coma: Secondary | ICD-10-CM | POA: Diagnosis not present

## 2018-09-14 DIAGNOSIS — I10 Essential (primary) hypertension: Secondary | ICD-10-CM | POA: Diagnosis not present

## 2018-09-14 DIAGNOSIS — G8929 Other chronic pain: Secondary | ICD-10-CM

## 2018-09-14 MED ORDER — AMLODIPINE BESYLATE 5 MG PO TABS: 5.0000 mg | ORAL_TABLET | Freq: Every day | ORAL | 3 refills | Status: DC

## 2018-09-14 MED ORDER — INSULIN GLARGINE 100 UNIT/ML SOLOSTAR PEN: 10.0000 [IU] | PEN_INJECTOR | SUBCUTANEOUS | 11 refills | Status: DC

## 2018-09-14 MED ORDER — FUROSEMIDE 20 MG PO TABS: 20.0000 mg | ORAL_TABLET | Freq: Every day | ORAL | 1 refills | Status: DC | PRN

## 2018-09-14 MED ORDER — CLONIDINE HCL 0.1 MG PO TABS: 0.1000 mg | ORAL_TABLET | Freq: Two times a day (BID) | ORAL | 1 refills | Status: DC

## 2018-09-14 MED ORDER — TRULICITY 1.5 MG/0.5ML ~~LOC~~ SOAJ: 1.5000 mg | SUBCUTANEOUS | 5 refills | Status: DC

## 2018-09-14 NOTE — Progress Notes (Signed)
Reviewed: I agree with Dr. Koval's documentation and management. 

## 2018-09-14 NOTE — Assessment & Plan Note (Signed)
Hypertension currently uncontrolled with BP 156/94 .  BP goal < 130/80 mmHg. Patient reports adherence with medication.  -Started Amlodipine 5 mg daily  -Continued HCTZ 25 mg daily -Continued Clonidine 0.3 mg/24hr weekly patch, if patch falls off take clonidine 0.1 mg twice daily (refilled oral clonidine)

## 2018-09-14 NOTE — Progress Notes (Signed)
S:     Chief Complaint  Patient presents with  . Medication Management    Diabetes    Patient arrives in good spirits with her son, ambulating with a walker. Presents for diabetes evaluation, education, and management. Patient was referred and last seen by Primary Care Provider, Dr. Zettie Cooley.    Patient reports Diabetes was diagnosed in 2018.   Insurance coverage/medication affordability: [place] Medicaid  Patient reports adherence with medications.  Current diabetes medications include: Trulicity (dulaglutide) 0.75 mg weekly Current hypertension medications include: HCTZ 25 mg daily, Clonidine 0.3 mg/24hr weekly patch, clonidine 0.1 mg twice daily  Patient denies hypoglycemic events.  Patient reported dietary habits: not a "breakfast eater", biggest meal is dinner - Fish, steak, eggs. Encouraged patient to eat more vegetables, fruits, and protein, and less grapes, melons, and carbs (rice, bread, mac & cheese).  Patient-reported exercise habits: unable to go to the pool to exercise   Patient reports nocturia - every 4 hours at night  Patient reports trouble swallowing pills - prefers injections or tiny pills   O:  Physical Exam Constitutional:      Appearance: She is obese.  Musculoskeletal:        General: No swelling.     Right lower leg: No edema.     Left lower leg: No edema.  Neurological:     Mental Status: She is alert.  Psychiatric:        Mood and Affect: Mood normal.    Review of Systems  Gastrointestinal:       Dysphagia in upper throat and lower esophagus   Musculoskeletal: Positive for joint pain.       Knees  All other systems reviewed and are negative.   Lab Results  Component Value Date   HGBA1C 8.3 (A) 01/31/2018   There were no vitals filed for this visit.  Lipid Panel     Component Value Date/Time   CHOL 178 11/27/2015 1408   TRIG 133 11/27/2015 1408   HDL 40 (L) 11/27/2015 1408   CHOLHDL 4.5 11/27/2015 1408   VLDL 27 11/27/2015  1408   LDLCALC 111 11/27/2015 1408    Home fasting CBG: 270s-304s   Clinical ASCVD: No  The 10-year ASCVD risk score Mikey Bussing DC Jr., et al., 2013) is: 9.3%   Values used to calculate the score:     Age: 53 years     Sex: Female     Is Non-Hispanic African American: Yes     Diabetic: Yes     Tobacco smoker: No     Systolic Blood Pressure: 741 mmHg     Is BP treated: Yes     HDL Cholesterol: 40 mg/dL     Total Cholesterol: 178 mg/dL    A/P: Diabetes longstanding since 2018 currently uncontrolled with at-home BGs 270s-304s. Patient is able to verbalize appropriate hypoglycemia management plan. Patient reports adherence with medication. Control is suboptimal due to need for additional therapy. -Started basal insulin Lantus (insulin glargine). Patient will continue to titrate 2 units daily if fasting BGs > 169m/dl until fasting BGs reach goal or next visit.  -Increased dose of GLP-1 Trulicity (generic name dulaglutide) to 1.5 mg weekly (every Sunday).  -Extensively discussed pathophysiology of DM, recommended lifestyle interventions, dietary effects on glycemic control -Counseled on s/sx of and management of hypoglycemia -Next A1C anticipated in three months.  .   Hypertension currently uncontrolled with BP 156/94 .  BP goal < 130/80 mmHg. Patient reports adherence  with medication.  -Started Amlodipine 5 mg daily  -Continued HCTZ 25 mg daily -Continued Clonidine 0.3 mg/24hr weekly patch, if patch falls off take clonidine 0.1 mg twice daily (refilled oral clonidine)  Written patient instructions provided.  Total time in face to face counseling 35 minutes.    Follow up Dr. Valentina Lucks via telephone in 1 week. Patient seen with Murlean Iba, PharmD Candidate and Lorel Monaco, PharmD PGY-1 Resident. Murlean Iba, PharmD Candidate, Lorel Monaco, PharmD PGY-1 Resident, Drexel Iha, PharmD, PGY2 Pharmacy Resident.

## 2018-09-14 NOTE — Assessment & Plan Note (Signed)
Diabetes longstanding since 2018 currently uncontrolled with at-home BGs 270s-304s. Patient is able to verbalize appropriate hypoglycemia management plan. Patient reports adherence with medication. Control is suboptimal due to need for additional therapy. -Started basal insulin Lantus (insulin glargine). Patient will continue to titrate 2 units daily if fasting BGs > 100mg /dl until fasting BGs reach goal or next visit.  -Increased dose of GLP-1 Trulicity (generic name dulaglutide) to 1.5 mg weekly (every Sunday).  -Extensively discussed pathophysiology of DM, recommended lifestyle interventions, dietary effects on glycemic control -Counseled on s/sx of and management of hypoglycemia -Next A1C anticipated in three months.  Marland Kitchen

## 2018-09-14 NOTE — Assessment & Plan Note (Signed)
Patient requested REFILL for chronic narcotic.  Deferred and routed to PCP.

## 2018-09-15 ENCOUNTER — Other Ambulatory Visit: Payer: Self-pay

## 2018-09-15 DIAGNOSIS — E0810 Diabetes mellitus due to underlying condition with ketoacidosis without coma: Secondary | ICD-10-CM

## 2018-09-15 DIAGNOSIS — G8929 Other chronic pain: Secondary | ICD-10-CM

## 2018-09-15 MED ORDER — CLONIDINE 0.3 MG/24HR TD PTWK: 0.3000 mg | MEDICATED_PATCH | TRANSDERMAL | 3 refills | Status: DC

## 2018-09-15 MED ORDER — ACCU-CHEK SOFT TOUCH LANCETS MISC: 12 refills | Status: DC

## 2018-09-18 ENCOUNTER — Telehealth: Payer: Self-pay | Admitting: *Deleted

## 2018-09-18 ENCOUNTER — Other Ambulatory Visit: Payer: Self-pay | Admitting: *Deleted

## 2018-09-18 DIAGNOSIS — I10 Essential (primary) hypertension: Secondary | ICD-10-CM

## 2018-09-18 DIAGNOSIS — G8929 Other chronic pain: Secondary | ICD-10-CM

## 2018-09-18 MED ORDER — "PEN NEEDLES 3/16"" 31G X 5 MM MISC": 12 refills | Status: DC

## 2018-09-18 NOTE — Telephone Encounter (Signed)
Pt never received pen needles.  Sent in so pt could take lantus. Christen Bame, CMA

## 2018-09-18 NOTE — Telephone Encounter (Signed)
Completed PA info in [place] for trulicity 1.5mg .  Status pending.  Will recheck status in 24 hours. Christen Bame, CMA

## 2018-09-18 NOTE — Telephone Encounter (Signed)
Pt has an appt coming up on 09/27/2018.  She wants to know if she can get enough pain pills to last till appt? To PCP. Christen Bame, CMA

## 2018-09-19 NOTE — Telephone Encounter (Signed)
Prior approval for trulicity 1.5mg  completed via [place] [employer].  Med approved for 09/18/2018 - 09/13/2019.  Pharmacy informed.  Christen Bame, CMA

## 2018-09-19 NOTE — Telephone Encounter (Signed)
Thank you!  Do I need to do anything else for this Prior autht?

## 2018-09-20 ENCOUNTER — Other Ambulatory Visit: Payer: Self-pay | Admitting: Family Medicine

## 2018-09-20 DIAGNOSIS — G8929 Other chronic pain: Secondary | ICD-10-CM

## 2018-09-20 MED ORDER — ALBUTEROL SULFATE HFA 108 (90 BASE) MCG/ACT IN AERS: 2.0000 | INHALATION_SPRAY | RESPIRATORY_TRACT | 12 refills | Status: DC | PRN

## 2018-09-20 MED ORDER — OXYCODONE HCL 5 MG PO TABS: 5.0000 mg | ORAL_TABLET | Freq: Three times a day (TID) | ORAL | 0 refills | Status: AC | PRN

## 2018-09-20 MED ORDER — CETIRIZINE HCL 10 MG PO TABS: 10.0000 mg | ORAL_TABLET | Freq: Every day | ORAL | 3 refills | Status: DC

## 2018-09-20 MED ORDER — PREGABALIN 50 MG PO CAPS: 50.0000 mg | ORAL_CAPSULE | Freq: Two times a day (BID) | ORAL | 0 refills | Status: DC

## 2018-09-20 MED ORDER — CLONIDINE HCL 0.1 MG PO TABS: 0.1000 mg | ORAL_TABLET | Freq: Two times a day (BID) | ORAL | 1 refills | Status: DC

## 2018-09-20 NOTE — Telephone Encounter (Signed)
2nd request from pharmacy. [name], CMA  

## 2018-09-20 NOTE — Telephone Encounter (Signed)
Nope..  All done!  Christen Bame, CMA

## 2018-09-20 NOTE — Progress Notes (Signed)
Spoke to patient on the phone about request of pain medication. Patient reports that the pain in her knees are so bad that she can't walk daily. She reports that she takes the medication everyday along with pregabalin. I previously denied this request as she has not had it filled in a few months and I have not seen her as a patient. Will refill 7 days of oxycodone and readdress when I see patient in clinic on 09/27/18. Also approved other meds as requested.   Wilber Oliphant, M.D.  PGY-2  Family Medicine  [phone] 09/20/2018 3:01 PM

## 2018-09-27 ENCOUNTER — Other Ambulatory Visit: Payer: Self-pay

## 2018-09-27 ENCOUNTER — Encounter: Payer: Self-pay | Admitting: Family Medicine

## 2018-09-27 ENCOUNTER — Ambulatory Visit: Payer: Medicaid Other | Admitting: Family Medicine

## 2018-09-27 VITALS — BP 118/86 | HR 110 | Wt 327.2 lb

## 2018-09-27 DIAGNOSIS — Z794 Long term (current) use of insulin: Secondary | ICD-10-CM

## 2018-09-27 DIAGNOSIS — R0601 Orthopnea: Secondary | ICD-10-CM

## 2018-09-27 DIAGNOSIS — Z23 Encounter for immunization: Secondary | ICD-10-CM

## 2018-09-27 DIAGNOSIS — R0609 Other forms of dyspnea: Secondary | ICD-10-CM

## 2018-09-27 DIAGNOSIS — F5101 Primary insomnia: Secondary | ICD-10-CM | POA: Diagnosis not present

## 2018-09-27 DIAGNOSIS — E0810 Diabetes mellitus due to underlying condition with ketoacidosis without coma: Secondary | ICD-10-CM

## 2018-09-27 DIAGNOSIS — I519 Heart disease, unspecified: Secondary | ICD-10-CM | POA: Diagnosis not present

## 2018-09-27 DIAGNOSIS — I5189 Other ill-defined heart diseases: Secondary | ICD-10-CM

## 2018-09-27 DIAGNOSIS — E119 Type 2 diabetes mellitus without complications: Secondary | ICD-10-CM

## 2018-09-27 DIAGNOSIS — F339 Major depressive disorder, recurrent, unspecified: Secondary | ICD-10-CM

## 2018-09-27 LAB — POCT GLYCOSYLATED HEMOGLOBIN (HGB A1C): HbA1c, POC (controlled diabetic range): 11.7 % — AB (ref 0.0–7.0)

## 2018-09-27 NOTE — Patient Instructions (Signed)
Dear [name],   It was good to see you! Thank you for taking your time to come in to be seen. Today, we discussed the following:   Diabetes    Take the blood sugar when you wake up. It doesn't have to be at any certain time of the day. Take the lantus when you wake up  Keep going with increasing 2 units every day.   Shortness of Breath    Echocardiogram tomorrow   I will let you know with the results   Anxiety  Please schedule a telemedicine appointment at the front before you leave    Be well,   Zettie Cooley, M.D   [place] [phone]  *Sign up for MyChart for instant access to your health profile, labs, orders, upcoming appointments or to contact your provider with questions*  =================================================================================== Some important items for [name]'s health:    Your Blood Pressure.  Should be LESS THAN 140/90 or 150/90 if you are over 65 BP Readings from Last 3 Encounters:  09/27/18 118/86  09/14/18 (!) 156/94  01/31/18 119/80    Your Weight History Wt Readings from Last 3 Encounters:  09/27/18 (!) 327 lb 3.2 oz (148.4 kg)  09/14/18 (!) 328 lb 6.4 oz (149 kg)  01/31/18 (!) 327 lb (148.3 kg)    Body mass index is 57.05 kg/m.  BMI Classes Classification BMI Category (kg/m2)  Underweight < 18.5  Normal Weight 18.5-24.9  Overweight  25.0-29.9  Obese Class I 30.0-34.9  Obese Class II 35.0-39.9  Obese Class III  > or  = 40.0      Your last A1C     Every 3-6 months if you have diabetes Lab Results  Component Value Date   HGBA1C 11.7 (A) 09/27/2018    Your last Cholesterol   Every 1-5 years    Component Value Date/Time   CHOL 178 11/27/2015 1408   HDL 40 (L) 11/27/2015 1408   LDLCALC 111 11/27/2015 1408    Your last Blood Tests -  Once a year if you take medications    Component Value Date/Time   K 4.3 06/14/2017 1514   CREATININE 0.78 06/14/2017 1514   CREATININE 0.75  11/27/2015 1408   GLUCOSE 124 (H) 06/14/2017 1514   GLUCOSE 142 (H) 11/27/2015 1408    To Keep You Healthy Your are due for the following Health Maintenance Items:  Health Maintenance Due  Topic Date Due  . OPHTHALMOLOGY EXAM  05/10/1976  . FOOT EXAM  09/13/2017  . HEMOGLOBIN A1C  08/01/2018  . INFLUENZA VACCINE  08/26/2018    Please schedule an appointment with your healthcare provider for any questions or concerns regarding your health or any of the items above.

## 2018-09-27 NOTE — Progress Notes (Signed)
Established Patient - Acute Visit Patient ID: MRN [id number], [birth year]  PCP: [name], MD  Subjective  CC: Diabetes Follow Up   BHA:LPFXTK [name] is Sara 53 y.o. female with past medical history s/f asthma, sarcoidosis, IDDM, morbid obesity OHS, OSA, postinflammatory pulomary fibrosis, sarcoid, MG who presents today with the following problems:  Diabetes [name] that she is currently taking 34 units of Lantus.  She has been titrating upwards per Dr. Deneise Lever orders 2 units daily until she gets her blood sugars in the 100s.  She is also taking Trulicity. Last week, it was in the 300s to 400s and she has been seeing improvement with titrating her insulin. This morning, her blood glucose was 200 even, yesterday 187.  Review of her glucometer reveals 432 on 8/29 at 800-- the rest are in the low 200's 8/25, in the previous week 300-400's.  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  Attempted to do Sara 24-hour intake with the patient and revealed the following 24 hours:  Can't remember when she woke up - doesn't sleep well, usually up through the night.  2pm: 1/2 Sara tuna sandwich with mayonaise.  1 am: whole tuna fish with mayonnaise sandwich.   [name] that she does forget to take her Lantus until the afternoon.  She has Sara hectic sleep schedule which has been irregular for Sara long time now.  She [name] that she is up during the night and wakes up at random times during the day.  She [name] that she will typically wake up for the morning and will not take her blood sugar or Lantus until after she is eaten.  In these cases, patient will remember to take her Lantus after she eats.  She will take her blood glucose prior to Lantus and then take it Sara few hours later.  She is wondering when the best time of the day is to take her insulin given her hectic sleep schedule.   Insomnia  Very irregular sleeping patter over the last few years. Sleeps  on Sara lot of pillows 4-5 pillows.  She [name] that she sleeps about 2 hours at Sara time and will wake up. She also [name] some dyspnea on exertion that has worsened over time.   Depression Patient also [name] that she has been having issues with depression.  She [name] that she is staying in her room all day. She lives with her family but she [name] she feels like Sara stranger in her own home.  She denies SI. She is on fluoxetine.  ROS: Pertinent ROS included in HPI. History: Medications, allergies, medical history, family history and social history were reviewed and edited as necessary.  Social Hx: [name] that she has never smoked. She has never used smokeless tobacco. She [name] that she does not drink alcohol or use drugs.   Objective   Physical Exam:  BP 118/86   Pulse (!) 110   Wt (!) 327 lb 3.2 oz (148.4 kg)   SpO2 98%   BMI 57.05 kg/m  Filed Weights   09/27/18 1540  Weight: (!) 327 lb 3.2 oz (148.4 kg)   General: NAD, non-toxic, well-appearing, obese, sitting comfortably in chair with sitting walker in front of her.  Her son Ephriam Knuckles is with her today. HEENT: [place]/AT. PERRLA. EOMI. unable to appreciate any JVD secondary to body habitus. Cardiovascular: RRR, normal S1, S2. B/L 2+ RP. No BLEE Respiratory: No wheezing.  Minor crackles noted on bases left worse than right. No IWOB.  Abdomen: + BS. NT, ND, soft to palpation.  Extremities: Warm and well perfused. Moving spontaneously.  Integumentary: No obvious rashes, lesions, trauma on general exam. Neuro: CN grossly intact. No FND  Pertinent Labs & Imaging:  2015 Echocardiogram 09/27/18 labs:  CMP wnl, CBC wnl, glucose 217, TSH 1.240  BNP 15.7   Echocardiogarm  Summary: Technically difficult study. LVEF 60-65%, moderate LVH, normal wall motion, grade 1 DD, indeterminate LV filling pressure IMPRESSION:   1. The left ventricle has normal systolic function with an ejection fraction of 60-65%. The cavity size was normal.  There is moderately increased left ventricular wall thickness. Left ventricular diastolic Doppler parameters are consistent with impaired  relaxation. Indeterminate filling pressures The E/e' is 8-15. No evidence of left ventricular regional wall motion abnormalities.  2. The mitral valve is grossly normal.  3. The aortic valve is tricuspid. No stenosis of the aortic valve.  4. The aorta is normal unless otherwise noted.  5. The interatrial septum was not well visualized.   Assessment  Depression, recurrent ([place]) Patient to follow up for further evaluation and management of depression via televisit.   Type 2 diabetes mellitus without complications (HCC) [name] is 11.7 today which is Sara large increase from 8.3 in January.  Patient is doing well with titrating lantus regimen. Currently at 34 units.  There has been an obvious decreasing trend while looking at her glucometer. Encouraged patient to try to eat healthy and more throughout the day at meal times. She has spoken with Dr. Jenne Campus in the past and [name] that it wasn't helpful as she could not keep up with the meals.  - Continue current titration. No changes today.  - patient to call Dr. Valentina Lucks when fasting sugars are in the 100's.  - Reviewed times to take blood sugars with patient as she was confused since her sleep schedule is so erratic. She will take her sugars whenever she wakes up from her typical longest sleep. It is okay if she wakes up at different times. Ideally, fasting would be after the longest duration of sleeping.  - reviewed symptoms of hypoglycemia - obtained CMP - flu shot today     Insomnia, idiopathic Several possible causes for patient's insomnia. She has poor sleep hygeine and spends most of her day in her room, per patient.  1. OSA- unsure if patient uses cpap at night. She endorses waking up with difficulty breathing.  2. OHS - Patient would like to talk about breast reduction at future visits.  3. Depression - will  discuss at next telemed visit.  4. PNO? - will obtain echocardiogram given patient's risk factors for HF (sarcoid- cor pulmonale, HTN, DM, obesity). Last echo obtained in 2015 - unable to see results. No BLEE today. Mild crackles on lungs. Unable to appreciate JVD. satting well on RA.   Orders Placed This Encounter  Procedures  . Flu Vaccine QUAD 36+ mos IM  . TSH  . Brain natriuretic peptide  . Comprehensive metabolic panel  . CBC With Differential  . POCT glycosylated hemoglobin (Hb A1C)  . ECHOCARDIOGRAM COMPLETE    Other items that patient would like to discuss in the future at follow up:   Anxiety   Breast reduction   Follow-up:  Future Appointments  Date Time Provider [place]  10/04/2018  4:00 PM Wilber Oliphant, MD [facility]  10/13/2018  4:00 PM Wilber Oliphant, MD [facility]  [name], M.D.  PGY-2  Family Medicine  [phone] 09/30/2018 12:29 PM

## 2018-09-28 ENCOUNTER — Ambulatory Visit (HOSPITAL_COMMUNITY)
Admission: RE | Admit: 2018-09-28 | Discharge: 2018-09-28 | Disposition: A | Discharge status: Home / Self Care | Payer: Medicaid Other | Source: Ambulatory Visit | Attending: Family Medicine | Admitting: Family Medicine

## 2018-09-28 DIAGNOSIS — G4730 Sleep apnea, unspecified: Secondary | ICD-10-CM | POA: Insufficient documentation

## 2018-09-28 DIAGNOSIS — R0601 Orthopnea: Secondary | ICD-10-CM | POA: Insufficient documentation

## 2018-09-28 DIAGNOSIS — K219 Gastro-esophageal reflux disease without esophagitis: Secondary | ICD-10-CM | POA: Insufficient documentation

## 2018-09-28 DIAGNOSIS — I10 Essential (primary) hypertension: Secondary | ICD-10-CM | POA: Diagnosis not present

## 2018-09-28 DIAGNOSIS — J984 Other disorders of lung: Secondary | ICD-10-CM | POA: Insufficient documentation

## 2018-09-28 LAB — CBC WITH DIFFERENTIAL
Basophils Absolute: 0 10*3/uL (ref 0.0–0.2)
Basos: 1 %
EOS (ABSOLUTE): 0.1 10*3/uL (ref 0.0–0.4)
Eos: 1 %
Hematocrit: 41.5 % (ref 34.0–46.6)
Hemoglobin: 13.5 g/dL (ref 11.1–15.9)
Immature Grans (Abs): 0 10*3/uL (ref 0.0–0.1)
Immature Granulocytes: 0 %
Lymphocytes Absolute: 2.1 10*3/uL (ref 0.7–3.1)
Lymphs: 24 %
MCH: 26.6 pg (ref 26.6–33.0)
MCHC: 32.5 g/dL (ref 31.5–35.7)
MCV: 82 fL (ref 79–97)
Monocytes Absolute: 0.6 10*3/uL (ref 0.1–0.9)
Monocytes: 7 %
Neutrophils Absolute: 5.7 10*3/uL (ref 1.4–7.0)
Neutrophils: 67 %
RBC: 5.07 x10E6/uL (ref 3.77–5.28)
RDW: 13.9 % (ref 11.7–15.4)
WBC: 8.5 10*3/uL (ref 3.4–10.8)

## 2018-09-28 LAB — COMPREHENSIVE METABOLIC PANEL
ALT: 14 IU/L (ref 0–32)
AST: 14 IU/L (ref 0–40)
Albumin/Globulin Ratio: 1.1 — ABNORMAL LOW (ref 1.2–2.2)
Albumin: 3.9 g/dL (ref 3.8–4.9)
Alkaline Phosphatase: 143 IU/L — ABNORMAL HIGH (ref 39–117)
BUN/Creatinine Ratio: 12 (ref 9–23)
BUN: 9 mg/dL (ref 6–24)
Bilirubin Total: 0.2 mg/dL (ref 0.0–1.2)
CO2: 23 mmol/L (ref 20–29)
Calcium: 9.4 mg/dL (ref 8.7–10.2)
Chloride: 101 mmol/L (ref 96–106)
Creatinine, Ser: 0.73 mg/dL (ref 0.57–1.00)
GFR calc Af Amer: 110 mL/min/{1.73_m2} (ref >59)
GFR calc non Af Amer: 95 mL/min/{1.73_m2} (ref >59)
Globulin, Total: 3.7 g/dL (ref 1.5–4.5)
Glucose: 217 mg/dL — ABNORMAL HIGH (ref 65–99)
Potassium: 3.9 mmol/L (ref 3.5–5.2)
Sodium: 141 mmol/L (ref 134–144)
Total Protein: 7.6 g/dL (ref 6.0–8.5)

## 2018-09-28 LAB — TSH: TSH: 1.24 u[IU]/mL (ref 0.450–4.500)

## 2018-09-28 LAB — BRAIN NATRIURETIC PEPTIDE: BNP: 15.7 pg/mL (ref 0.0–100.0)

## 2018-09-28 NOTE — Progress Notes (Signed)
  Echocardiogram 2D Echocardiogram has been performed.  [name] M 09/28/2018, 3:42 PM

## 2018-09-30 ENCOUNTER — Encounter: Payer: Self-pay | Admitting: Family Medicine

## 2018-09-30 DIAGNOSIS — I5189 Other ill-defined heart diseases: Secondary | ICD-10-CM | POA: Insufficient documentation

## 2018-09-30 DIAGNOSIS — I519 Heart disease, unspecified: Secondary | ICD-10-CM | POA: Insufficient documentation

## 2018-09-30 DIAGNOSIS — F5101 Primary insomnia: Secondary | ICD-10-CM | POA: Insufficient documentation

## 2018-09-30 NOTE — Assessment & Plan Note (Signed)
Several possible causes for patient's insomnia. She has poor sleep hygeine and spends most of her day in her room, per patient.  1. OSA- unsure if patient uses cpap at night. She endorses waking up with difficulty breathing.  2. OHS - Patient would like to talk about breast reduction at future visits.  3. Depression - will discuss at next telemed visit.  4. PNO? - will obtain echocardiogram given patient's risk factors for HF (sarcoid- cor pulmonale, HTN, DM, obesity). Last echo obtained in 2015 - unable to see results. No BLEE today. Mild crackles on lungs. Unable to appreciate JVD. satting well on RA.

## 2018-09-30 NOTE — Assessment & Plan Note (Signed)
Patient to follow up for further evaluation and management of depression via televisit.

## 2018-09-30 NOTE — Assessment & Plan Note (Addendum)
A1C is 11.7 today which is a large increase from 8.3 in January.  Patient is doing well with titrating lantus regimen. Currently at 34 units.  There has been an obvious decreasing trend while looking at her glucometer. Encouraged patient to try to eat healthy and more throughout the day at meal times. She has spoken with Dr. Jenne Campus in the past and reports that it wasn't helpful as she could not keep up with the meals.  - Continue current titration. No changes today.  - patient to call Dr. Valentina Lucks when fasting sugars are in the 100's.  - Reviewed times to take blood sugars with patient as she was confused since her sleep schedule is so erratic. She will take her sugars whenever she wakes up from her typical longest sleep. It is okay if she wakes up at different times. Ideally, fasting would be after the longest duration of sleeping.  - reviewed symptoms of hypoglycemia - obtained CMP - flu shot today

## 2018-10-04 ENCOUNTER — Telehealth (INDEPENDENT_AMBULATORY_CARE_PROVIDER_SITE_OTHER): Payer: Medicaid Other | Admitting: Family Medicine

## 2018-10-04 ENCOUNTER — Other Ambulatory Visit: Payer: Self-pay

## 2018-10-04 ENCOUNTER — Encounter: Payer: Self-pay | Admitting: Family Medicine

## 2018-10-04 DIAGNOSIS — R1012 Left upper quadrant pain: Secondary | ICD-10-CM | POA: Diagnosis not present

## 2018-10-04 NOTE — Progress Notes (Addendum)
[place] Telemedicine Visit  Patient consented to have virtual visit. Method of visit: Telephone  Encounter participants: Patient: [name] - located at home Provider: Wilber Oliphant - located at [facility]  Others (if applicable): none   Chief Complaint: Anxiety   HPI: Been over a year since having an anxiety attack. Nervous stomach. She always has to go to the bathroom before leaving. She just doesn't like to leave the house. She doesn't like to go anywhere. Always seems to have stomach issues. Rather be by herself. Doesn't like to be in crowds. She reports that she does not know exactly what causes this reaction, but that it has been this way for a long while. She also reports disliking talking on the phone. She is currently  Taking sertraline but reports that she can't tell if its really working.   Left sided abdominal Pain  Located on left upper abdomen area. Described as dull pain that is consistent. Usually pain is on left side only, but sometimes does happen on both sides. She uses pillows to cusion the area that is helpful, but only for a short period of time. There is only relief when she is able to prop up her arms so that there is not so much pressure on her breast and then abdomen. She denies fevers, constipation, diarrhea, changes in stool, hx of splenomegaly, flank pain, or changes in urination. We had discussed shortly at her last appointment her desire for breast reduction.   ROS: per HPI  Pertinent PMHx: Anxiety, obesity   Exam:  Respiratory: wnl. No increased WOB. Speaking in full sentences.   Assessment/Plan: Anxiety   sending list of psychiatrists and therapists for openings to help with anxiety and depression via mail    online depression websites    Upper abdominal Pain/ Desire for Breast reduction Severe obesity  Pain is likely due to obesity rather than underlying process. Sending patient referral to general surgery for possible  breast reduction consultation. I explained to patient that she is not an ideal surgical candidate, especially for an elective procedure.  It will be necessary that she continues to lose weight despite the many difficulties that keep her from exercising.  Patient reports that she has lost 100 pounds.  But on chart review, it appears this underpants has been over a period of 10 years.  I explained to patient that it is necessary that she have more weight loss in order to help many of her medical problems, including the constant left upper abdominal pain.  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 Per Dr. Ernestina Penna note in January 2020, they were working on decreasing the # tabs every month (per last note, to fill #81 at next visit). However, also notable that patient has not required monthly refills most recently and has been making medication last a little longer. I will continue to fill pain tabs to help with weight reduction for this patient.   Chronic Pain  Time spent during visit with patient: 22 minutes  Wilber Oliphant, M.D.  PGY-2  Family Medicine  [phone] 10/05/2018 2:40 PM

## 2018-10-05 DIAGNOSIS — R1012 Left upper quadrant pain: Secondary | ICD-10-CM | POA: Insufficient documentation

## 2018-10-05 MED ORDER — OXYCODONE HCL 5 MG PO TABS: 5.0000 mg | ORAL_TABLET | ORAL | 0 refills | Status: AC | PRN

## 2018-10-05 NOTE — Addendum Note (Signed)
Addended by: Zettie Cooley E on: 10/05/2018 02:55 PM   Modules accepted: Orders

## 2018-10-13 ENCOUNTER — Ambulatory Visit: Payer: Medicaid Other | Admitting: Family Medicine

## 2018-10-18 ENCOUNTER — Other Ambulatory Visit: Payer: Self-pay | Admitting: Student in an Organized Health Care Education/Training Program

## 2018-10-18 ENCOUNTER — Other Ambulatory Visit: Payer: Self-pay | Admitting: Family Medicine

## 2018-10-18 DIAGNOSIS — I10 Essential (primary) hypertension: Secondary | ICD-10-CM

## 2018-10-19 ENCOUNTER — Encounter: Payer: Self-pay | Admitting: Physical Therapy

## 2018-10-19 ENCOUNTER — Ambulatory Visit: Payer: Medicaid Other | Attending: Family Medicine | Admitting: Physical Therapy

## 2018-10-19 ENCOUNTER — Other Ambulatory Visit: Payer: Self-pay

## 2018-10-19 DIAGNOSIS — M6281 Muscle weakness (generalized): Secondary | ICD-10-CM | POA: Diagnosis present

## 2018-10-19 DIAGNOSIS — G8929 Other chronic pain: Secondary | ICD-10-CM | POA: Insufficient documentation

## 2018-10-19 DIAGNOSIS — M25561 Pain in right knee: Secondary | ICD-10-CM | POA: Diagnosis present

## 2018-10-19 DIAGNOSIS — M25562 Pain in left knee: Secondary | ICD-10-CM | POA: Diagnosis present

## 2018-10-19 DIAGNOSIS — M25661 Stiffness of right knee, not elsewhere classified: Secondary | ICD-10-CM

## 2018-10-19 NOTE — Therapy (Signed)
[facility] [address] [place], [place], [address] Phone: [phone]   Fax:  [phone]  Physical Therapy Evaluation  Patient Details  Name: [name] MRN: [id number] Date of Birth: May 18, 1965 Referring Provider (PT): Genia Hotter, MD/Marshall Deirdre Priest, MD   Encounter Date: 10/19/2018  PT End of Session - 10/19/18 1342    Visit Number  1    Number of Visits  4    Date for PT Re-Evaluation  11/17/18    Authorization Type  Medicaid    PT Start Time  1340   pt. arrived late   PT Stop Time  1417    PT Time Calculation (min)  37 min    Activity Tolerance  Patient limited by pain    Behavior During Therapy  [facility] for tasks assessed/performed       Past Medical History:  Diagnosis Date  . ALLERGIC RHINITIS    takes Zyrtec daily  . Anxiety   . Arthritis   . Asthma    Albuterol prn;Symbicort daily and Singulair daily  . Bronchitis    hx of  . CAP (community acquired pneumonia) 02/20/2015  . Carpal tunnel syndrome    right  . Chronic back pain   . Constipation    Miralax prn  . Depression    takes Cymbalta daily  . Diabetes (HCC)   . Dizziness    pt states she gets off balance occasionally  . Eczema   . Gallstones 2010  . GERD (gastroesophageal reflux disease)    takes Omeprazole daily  . Headache(784.0)    couple of times a week  . Hemorrhoids   . Hypertension    takes Prinizide daily and Clonidine on Mondays  . Hypoventilation associated with obesity syndrome (HCC)   . Insomnia   . Irritable bladder   . Joint pain   . Joint swelling   . Lung disease    scleraderma  . Morbid (severe) obesity due to excess calories (HCC) 04/10/2007   Restrictive changes on pfts 11/30/2007    . Myasthenia gravis 1994   Positive Acetylcholine receptor Ab and single fiber EMG Dr. Clarisa [facility] 1996- Dr. Noreene Filbert 1998, Dr Sharene Skeans 2008 [facility]- Failed prednisone due to weight gain, stopped  imuran/mestinon due to finances- Now with quiescent disease per Dr Sharene Skeans and no flares in many years  . Myasthenia gravis   . Nocturia   . OSA (obstructive sleep apnea)    RDI 10 on PSG 10/09  . Osteoarthritis   . Pelvic inflammatory disease (PID)   . Peptic ulcer   . Pneumonia    hx of;last time about 1-17yrs ago  . 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 Coralyn Helling- 10/30/2007 ANA positive, ANA titer  negative, RF less than 20  . Rheumatoid arthritis(714.0)   . Shortness of breath    can be sitting as well as exertion  . Skin irritation    skin itchy  . Trigger finger   . Urinary frequency    takes Ditropan daily  . Urinary urgency   . Viral meningitis    history of viral meningitis    Past Surgical History:  Procedure Laterality Date  . BRONCHOSCOPY  08/2007  . CARPAL TUNNEL RELEASE  05/20/2011   Procedure: CARPAL TUNNEL RELEASE;  Surgeon: [name], MD;  Location: [facility] OR;  Service: Orthopedics;  Laterality: Right;  . CESAREAN SECTION  09/15/2004  . COLONOSCOPY N/A 01/21/2014  Procedure: COLONOSCOPY;  Surgeon: Gatha Mayer, MD;  Location: [facility]COPY;  Service: Endoscopy;  Laterality: N/A;  . cortisone injection     receives an injection every 28months  . ESOPHAGOGASTRODUODENOSCOPY    . ESOPHAGOGASTRODUODENOSCOPY N/A 01/21/2014   Procedure: ESOPHAGOGASTRODUODENOSCOPY (EGD);  Surgeon: Gatha Mayer, MD;  Location: [facility];  Service: Endoscopy;  Laterality: N/A;  . LAPAROSCOPIC CHOLECYSTECTOMY  07/2008   by Dr. Neldon Mc  . Thymus resection  08/25/1993  . TRIGGER FINGER RELEASE  05/20/2011   Procedure: RELEASE TRIGGER FINGER/A-1 PULLEY;  Surgeon: Nita Sells, MD;  Location: [facility];  Service: Orthopedics;  Laterality: Right;  RIGHT TRIGGER THUMB RELEASE AND RIGHT CARPAL TUNNEL RELEASE    There were no vitals filed for this visit.   Subjective Assessment - 10/19/18 1414    Subjective  Pt. is a 53 y/o  female referred to PT with dx. morbid obesity with bilat. knee pain for focus seated exercises. She has a multi-year history of right>left bilat. knee pain with end-stage OA. Due to BMI she has not been a candidate for TKA but pt. is trying to lose weight in order to qualify but knee pain has been limiting factor. Past tx. efforts include injections and Visco supplementation without significant improvement. Prior to Covid outbreak pt. reports had been doing some aquatic exercise at local facility but had to stop due to facility closure and has not returned.    Pertinent History  morbid obesity, RA, myasthenia gravis, chronic pain in pain management, depression, end-stage knee OA    Limitations  Standing;Walking;House hold activities    How long can you stand comfortably?  unable comfortably    How long can you walk comfortably?  unable comfortably    Diagnostic tests  past X-rays    Patient Stated Goals  Patient reports she does not have any personal goals for therapy    Currently in Pain?  Yes    Pain Score  10-Worst pain ever    Pain Location  Knee    Pain Orientation  Right;Left    Pain Descriptors / Indicators  Aching;Sharp    Pain Type  Chronic pain    Pain Onset  More than a month ago    Pain Frequency  Constant    Aggravating Factors   standing and walking    Pain Relieving Factors  rest, sitting    Effect of Pain on Daily Activities  limits standing and walking tolerance         [facility] PT Assessment - 10/19/18 0001      Assessment   Medical Diagnosis  Morbid obesity, bilat. knee pain, stomach pain    Referring Provider (PT)  Zettie Cooley, MD/Marshall Erin Hearing, MD    Onset Date/Surgical Date  10/04/18    Hand Dominance  Right    Prior Therapy  none      Precautions   Precautions  Fall      Restrictions   Weight Bearing Restrictions  No      Balance Screen   Has the patient fallen in the past 6 months  No      [place] residence     Living Arrangements  Children    Type of Home  Apartment    Home Access  --   1 step to enter     Prior Function   Level of Independence  Independent with community mobility with device   uses rollator-reports use the  past several years     Cognition   Overall Cognitive Status  Within Functional Limits for tasks assessed      ROM / Strength   AROM / PROM / Strength  AROM;Strength      AROM   AROM Assessment Site  Knee    Right/Left Knee  Right;Left    Right Knee Extension  -20   lacks 20 deg from neutral   Right Knee Flexion  100    Left Knee Extension  -5    Left Knee Flexion  110      Strength   Strength Assessment Site  Hip;Knee    Right/Left Hip  Right;Left    Right Hip Flexion  3-/5    Right Hip External Rotation   3-/5    Right Hip Internal Rotation  3-/5    Left Hip Flexion  4/5    Left Hip External Rotation  4/5    Left Hip Internal Rotation  4/5    Right/Left Knee  Right;Left    Right Knee Flexion  4-/5    Right Knee Extension  4/5    Left Knee Flexion  4/5    Left Knee Extension  4+/5      Ambulation/Gait   Gait Comments  Pt. ambulates mod I with rollator with antalgic gait, decreased right knee extension at initial strike phase                Objective measurements completed on examination: See above findings.      [facility] Adult PT Treatment/Exercise - 10/19/18 0001      Exercises   Exercises  Knee/Hip      Knee/Hip Exercises: Stretches   Other Knee/Hip Stretches  HEP instruction-handout review for LAQ, hamstring curls, seated hip add./abduction, marches, hamstring stretch, glut set             PT Education - 10/19/18 1439    Education Details  HEP, POC    Person(s) Educated  Patient    Methods  Explanation;Demonstration;Verbal cues;Handout    Comprehension  Verbalized understanding;Returned demonstration          PT Long Term Goals - 10/19/18 1504      PT LONG TERM GOAL #1   Title  Independent with HEP    Baseline   needs HEP    Time  4    Period  Weeks    Status  New    Target Date  11/17/18      PT LONG TERM GOAL #2   Title  Increase right knee extension AROM 5-10 deg from current status to improve ability for upright stance for ambulation and standing for chores    Baseline  lacks 20 deg from neutral    Time  4    Period  Weeks    Status  New    Target Date  11/17/18      PT LONG TERM GOAL #3   Title  Increase bilat. knee strength 1/2 MMT grade to improve ability for sit>stand from low chairs and navigating step to apartment entrance    Baseline  right knee ext 4/5, flex 4-/5, left knee ext 4+/5, flex 4/5    Time  4    Period  Weeks    Status  New    Target Date  11/17/18             Plan - 10/19/18 1440    Clinical Impression Statement  Pt. presents with bilat. knee pain  with muscle weakness and stiffness/decreased ROM with status and ability to exercise complicated by obesity/medical comorbidities. Pt. would benefit from PT to help address current deficits to improve mobility tolerance for ADLs, community mobility and ability to exercise.    Personal Factors and Comorbidities  Comorbidity 3+;Time since onset of injury/illness/exacerbation    Comorbidities  obesity, myasthenia gravis, RA, depression-see PMH    Examination-Activity Limitations  Lift;Stand;Locomotion Level;Bend;Transfers;Carry;Squat;Stairs    Clinical Decision Making  Moderate    Rehab Potential  Fair    PT Frequency  1x / week    PT Duration  4 weeks    PT Treatment/Interventions  ADLs/Self Care Home Management;Moist Heat;Ultrasound;Cryotherapy;Electrical Stimulation;Therapeutic exercise;Therapeutic activities;Neuromuscular re-education;Manual techniques;Dry needling;Taping;Patient/family education;Gait training    PT Next Visit Plan  focus seated exercises for knee and hip ROM and strengthening, stretches emphasize right knee extension/hamstring and gastroc, modalties prn    PT Home Exercise Plan  LAQ, hamstring  curl, seated hip abd, seated hip add. isometric, seated glut set, seated hamstring stretch, seated marches    Consulted and Agree with Plan of Care  Patient       Patient will benefit from skilled therapeutic intervention in order to improve the following deficits and impairments:  Pain, Abnormal gait, Decreased strength, Decreased range of motion, Decreased endurance, Difficulty walking, Impaired flexibility, Obesity  Visit Diagnosis: Morbid obesity (HCC)  Chronic pain of right knee  Chronic pain of left knee  Stiffness of right knee, not elsewhere classified  Muscle weakness (generalized)     Problem List Patient Active Problem List   Diagnosis Date Noted  . Abdominal pain, left upper quadrant 10/05/2018  . Insomnia, idiopathic 09/30/2018  . Grade I diastolic dysfunction 09/30/2018  . Morbid obesity with BMI of 60.0-69.9, adult (HCC) 06/18/2015  . Encounter for chronic pain management 02/11/2014  . Diabetes (HCC) 02/11/2014  . Type 2 diabetes mellitus without complications (HCC) 02/11/2014  . Cystocele 09/14/2012  . Sarcoidosis 12/23/2010  . 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  . Allergic rhinitis 05/30/2008  . 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 Uncomplicated asthma 12/01/2007  . 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 OSA (obstructive sleep apnea) 10/30/2007  . Morbid (severe) obesity due to excess calories (HCC) 04/10/2007  . MYASTHENIA 03/07/2007  . Depression, recurrent (HCC) 03/24/2006  . Essential (primary) hypertension 03/24/2006  . Gastro-esophageal reflux disease without esophagitis 03/24/2006  . Major depressive disorder, single episode 03/24/2006    Lazarus Gowda, PT, DPT 10/19/18 3:08 PM  [facility] [address] [place], [place], [address] Phone: [phone]   Fax:  [phone]  Name: ENYLA LISBON MRN: [id number] Date of Birth: [birth year]

## 2018-10-22 ENCOUNTER — Other Ambulatory Visit: Payer: Self-pay | Admitting: Family Medicine

## 2018-10-22 DIAGNOSIS — G8929 Other chronic pain: Secondary | ICD-10-CM

## 2018-10-24 ENCOUNTER — Other Ambulatory Visit: Payer: Self-pay | Admitting: *Deleted

## 2018-10-24 DIAGNOSIS — D869 Sarcoidosis, unspecified: Secondary | ICD-10-CM

## 2018-10-24 MED ORDER — OMEPRAZOLE 40 MG PO CPDR: 40.0000 mg | DELAYED_RELEASE_CAPSULE | Freq: Every day | ORAL | 11 refills | Status: DC

## 2018-11-01 ENCOUNTER — Ambulatory Visit: Payer: Medicaid Other | Admitting: Physical Therapy

## 2018-11-07 ENCOUNTER — Other Ambulatory Visit: Payer: Self-pay

## 2018-11-07 DIAGNOSIS — E0810 Diabetes mellitus due to underlying condition with ketoacidosis without coma: Secondary | ICD-10-CM

## 2018-11-07 DIAGNOSIS — D869 Sarcoidosis, unspecified: Secondary | ICD-10-CM

## 2018-11-07 DIAGNOSIS — I10 Essential (primary) hypertension: Secondary | ICD-10-CM

## 2018-11-07 MED ORDER — FUROSEMIDE 20 MG PO TABS: 20.0000 mg | ORAL_TABLET | Freq: Every day | ORAL | 0 refills | Status: DC | PRN

## 2018-11-07 MED ORDER — OMEPRAZOLE 40 MG PO CPDR: 40.0000 mg | DELAYED_RELEASE_CAPSULE | Freq: Every day | ORAL | 11 refills | Status: DC

## 2018-11-07 MED ORDER — INSULIN GLARGINE 100 UNIT/ML SOLOSTAR PEN: 80.0000 [IU] | PEN_INJECTOR | SUBCUTANEOUS | 1 refills | Status: DC

## 2018-11-07 NOTE — Telephone Encounter (Signed)
Patient calls nurse line stating she has been taking 80 units of Lantus daily. Patient states she needs a refill. Patient unsure if she needs to come in for a DM apt with PCP or [place]. Last seen for diabetes in September.

## 2018-11-08 ENCOUNTER — Ambulatory Visit: Payer: Medicaid Other | Admitting: Physical Therapy

## 2018-11-15 ENCOUNTER — Telehealth: Payer: Self-pay | Admitting: Physical Therapy

## 2018-11-15 ENCOUNTER — Ambulatory Visit: Payer: Medicaid Other | Attending: Family Medicine | Admitting: Physical Therapy

## 2018-11-15 NOTE — Telephone Encounter (Signed)
Attempted to call regarding no show for 4:30 therapy appointment-no answer-left voicemail to return call if wishing to reschedule.

## 2018-11-17 ENCOUNTER — Other Ambulatory Visit: Payer: Self-pay | Admitting: Family Medicine

## 2018-11-17 DIAGNOSIS — G8929 Other chronic pain: Secondary | ICD-10-CM

## 2018-11-17 DIAGNOSIS — I10 Essential (primary) hypertension: Secondary | ICD-10-CM

## 2018-11-20 ENCOUNTER — Telehealth: Payer: Self-pay | Admitting: *Deleted

## 2018-11-20 NOTE — Telephone Encounter (Signed)
Pt now uses [facility].   They package her meds for her in blister packs.  She was told "years ago" that she could take both pills of Ditropan in the AM instead of one in the AM and one in the PM.   Since they are packing them she will need a new script sent in with those instructions.  I have generated a refill request.    Dr. [name] please review and approved if appropriate.  Christen Bame, CMA

## 2018-11-21 ENCOUNTER — Other Ambulatory Visit: Payer: Self-pay

## 2018-11-21 DIAGNOSIS — Z20822 Contact with and (suspected) exposure to covid-19: Secondary | ICD-10-CM

## 2018-11-23 LAB — NOVEL CORONAVIRUS, NAA: SARS-CoV-2, NAA: NOT DETECTED

## 2018-11-24 ENCOUNTER — Other Ambulatory Visit: Payer: Self-pay | Admitting: Family Medicine

## 2018-11-28 ENCOUNTER — Other Ambulatory Visit: Payer: Self-pay

## 2018-11-28 DIAGNOSIS — I10 Essential (primary) hypertension: Secondary | ICD-10-CM

## 2018-11-29 ENCOUNTER — Telehealth: Payer: Self-pay | Admitting: *Deleted

## 2018-11-29 MED ORDER — FUROSEMIDE 20 MG PO TABS: 20.0000 mg | ORAL_TABLET | Freq: Every day | ORAL | 0 refills | Status: DC | PRN

## 2018-11-29 NOTE — Telephone Encounter (Signed)
Patient should only be taking as needed. If she has been needing to take it everyday, she should set up an appointment either in person or virtually.  Wilber Oliphant, M.D.  4:23 PM 11/29/2018

## 2018-11-29 NOTE — Telephone Encounter (Signed)
Yes, medication was sent.   Thank you!  [name]

## 2018-11-29 NOTE — Telephone Encounter (Signed)
Had Rx request for Furosemide 20 mg tablet to the pharmacy in [place], when I called pt to tell her that she had a Rx of 10 pills as needed at the [place] location she said that she thought Dr. [name] said she should take it everyday.  Told pt I would hold off sending this request until I heard back from PCP. I did not see any mention of taking this everyday in the notes. Please advise . April Zimmerman Rumple, CMA

## 2018-11-29 NOTE — Telephone Encounter (Signed)
Was this addressed? Jessica [name], CMA  

## 2018-12-01 NOTE — Telephone Encounter (Signed)
Pt informed of below.April Zimmerman Rumple, CMA ? ?

## 2018-12-11 ENCOUNTER — Other Ambulatory Visit: Payer: Self-pay | Admitting: Family Medicine

## 2018-12-11 DIAGNOSIS — G8929 Other chronic pain: Secondary | ICD-10-CM

## 2018-12-11 DIAGNOSIS — E0810 Diabetes mellitus due to underlying condition with ketoacidosis without coma: Secondary | ICD-10-CM

## 2018-12-21 ENCOUNTER — Other Ambulatory Visit: Payer: Self-pay | Admitting: Family Medicine

## 2018-12-21 DIAGNOSIS — G8929 Other chronic pain: Secondary | ICD-10-CM

## 2018-12-26 NOTE — Telephone Encounter (Signed)
Reviewed medications as patient has long medication list and I want to make sure she should be on some of these medications. Ditropan started in 2008-- cannot find for what indication. Lyrica started by Dr. Burr Medico as adjunct for pain. Will continue both at this time and readdress both at next visit.

## 2019-01-08 ENCOUNTER — Telehealth: Payer: Self-pay | Admitting: *Deleted

## 2019-01-08 DIAGNOSIS — I10 Essential (primary) hypertension: Secondary | ICD-10-CM

## 2019-01-09 MED ORDER — FUROSEMIDE 20 MG PO TABS: 20.0000 mg | ORAL_TABLET | Freq: Every day | ORAL | 0 refills | Status: DC | PRN

## 2019-01-09 NOTE — Telephone Encounter (Signed)
Patient needs appointment for DM follow up. Virtual is just fine.

## 2019-01-10 NOTE — Telephone Encounter (Addendum)
LVM for pt to call office to schedule an appointment for DM this can be in person or virtual per Dr. [name].  Please assist her if she calls back, will also send My Chart message. Mairi Stagliano Zimmerman Rumple, CMA

## 2019-01-10 NOTE — Telephone Encounter (Signed)
Contacted pt and she is going to do a My Chart visit on 12/28 to talk about her DM.  While on the phone she stated that she would be needing a refill on her pain meds and wanted to know if that could be done at that visit or if she would have to come in person to get that.  I told her I would send message to PCP to see and if we need to we could change it to in person.[name], CMA

## 2019-01-22 ENCOUNTER — Telehealth (INDEPENDENT_AMBULATORY_CARE_PROVIDER_SITE_OTHER): Payer: Medicaid Other | Admitting: Family Medicine

## 2019-01-22 ENCOUNTER — Encounter: Payer: Self-pay | Admitting: Family Medicine

## 2019-01-22 ENCOUNTER — Other Ambulatory Visit: Payer: Self-pay

## 2019-01-22 DIAGNOSIS — Z794 Long term (current) use of insulin: Secondary | ICD-10-CM | POA: Diagnosis not present

## 2019-01-22 DIAGNOSIS — M7989 Other specified soft tissue disorders: Secondary | ICD-10-CM

## 2019-01-22 DIAGNOSIS — G8929 Other chronic pain: Secondary | ICD-10-CM | POA: Diagnosis not present

## 2019-01-22 DIAGNOSIS — E119 Type 2 diabetes mellitus without complications: Secondary | ICD-10-CM

## 2019-01-22 MED ORDER — "PEN NEEDLES 3/16"" 31G X 5 MM MISC": 12 refills | Status: DC

## 2019-01-22 MED ORDER — OXYCODONE HCL 5 MG PO TABS: 5.0000 mg | ORAL_TABLET | Freq: Four times a day (QID) | ORAL | 0 refills | Status: DC | PRN

## 2019-01-22 NOTE — Assessment & Plan Note (Signed)
Refill #81 oxycodone 5 mg for osteoarthritis pain.  Have spoken at length in the past about weight loss to help with osteoarthritis.  Patient has been referred to bariatric surgery in the past and was following with them previously prior to her husband falling ill.  I have sent in a new referral for her to get this process started.  I believe she would benefit greatly from weekly appointments with bariatric clinic. Follow-up as needed

## 2019-01-22 NOTE — Progress Notes (Signed)
[place] Telemedicine Visit  Patient consented to have virtual visit. Method of visit: Video was attempted, but technology challenges prevented patient from using video, so visit was conducted via telephone.  Encounter participants: Patient: [name] - located at Home Provider: Wilber Oliphant - located at [facility] Others (if applicable):   Chief Complaint: Pain med refill   HPI: Pain medication Refill  Knees and lower back pain that is chronic. Pain in neck and shoulder area. Main reason is for knee pain. Has gone to physical therapy. Wishes she could be going to the pool which helps with exercising and she feels she can move around more. Has been referred to Bariatric clinic and was doing a program a few years ago. Then her husband got sick and it got away from her.  Patient's husband passed away on the 06/01/22 and she reports that she has been in and out of hospitals and hospice for the last several months as he has been very sick.  She reports that she overall has not been taking care of herself very well.  Leg swelling Right leg swollen for about 3-4 weeks. No pain or redness. Uncomfortable in her shoe and doesn't fit. She does not think this is a DVT. Previously started on lasix because she had swelling in both legs.   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  Have spoken at length in the past about weight loss to help with osteoarthritis.  Patient has been referred to bariatric surgery in the past and was following with them previously prior to her husband falling ill.  I have sent in a new referral for her to get this process  started.  I believe she would benefit greatly from weekly appointments with bariatric clinic. Follow-up as needed  Right leg swelling Patient reports that her right leg has been swollen for the last 3 to 4 weeks.  There has been confusion with her Lasix regimen and whether she should be taking it every day or as needed.  Ultimately, patient was eventually put on 20 mg of Lasix to be sent with her pill packs daily.  She reports that she had bilateral swelling when she started the Lasix and it has only been on her right side recently she denies any redness, pain.  Her Wells score is 0.  Patient given return precautions to ED if she has any pain, redness, SOB, difficulty breathing.  I suspect swelling has more to do with obesity and sedentary lifestyle.  We will continue to follow.  Patient reports that blood pressures have been running a little bit high recently due to a lot of stress. -Continue to monitor -Patient given return precautions -Continue Lasix 20 mg daily, kidney function within normal limits  Type 2 diabetes mellitus without complications ([place]) Patient is due for A1c check.  She reports poor diet over the last couple months due to being in the hospital and caring for her husband.  Patient is agreeable to following up for diabetes checkup in coming weeks.   Time spent during visit with patient: 25 minutes  Wilber Oliphant, M.D.  11:35 AM 01/22/2019

## 2019-01-22 NOTE — Assessment & Plan Note (Addendum)
Patient reports that her right leg has been swollen for the last 3 to 4 weeks.  There has been confusion with her Lasix regimen and whether she should be taking it every day or as needed.  Ultimately, patient was eventually put on 20 mg of Lasix to be sent with her pill packs daily.  She reports that she had bilateral swelling when she started the Lasix and it has only been on her right side recently she denies any redness, pain.  Her Wells score is 0.  Patient given return precautions to ED if she has any pain, redness, SOB, difficulty breathing.  I suspect swelling has more to do with obesity and sedentary lifestyle.  We will continue to follow.  Patient reports that blood pressures have been running a little bit high recently due to a lot of stress. -Continue to monitor -Patient given return precautions -Continue Lasix 20 mg daily, kidney function within normal limits

## 2019-01-22 NOTE — Progress Notes (Addendum)
Called patient back about leg swelling after discussion with Dr. Erin Hearing. Would want patient to be seen for it if she were willing to come in. She doesn't think this is an issue and thinks it is due to a brace that she was using to help with her right knee pain. Patient reports that she may have tightened it too tight. She is not concerned about DVT, but is agreeable to being seen in clinic for further evaluation.   Future Appointments  Date Time Provider [place]  01/24/2019  2:10 PM ACCESS TO CARE POOL [facility] [place]   She can also get her A1C drawn at that time.  CMP - recheck Alk Phos as elevated in September and renal function (recent change to daily lasix use) Orders already placed

## 2019-01-22 NOTE — Assessment & Plan Note (Signed)
Patient is due for A1c check.  She reports poor diet over the last couple months due to being in the hospital and caring for her husband.  Patient is agreeable to following up for diabetes checkup in coming weeks.

## 2019-01-22 NOTE — Addendum Note (Signed)
Addended by: Wilber Oliphant on: 01/22/2019 05:58 PM   Modules accepted: Orders

## 2019-01-23 ENCOUNTER — Telehealth: Payer: Self-pay | Admitting: *Deleted

## 2019-01-23 NOTE — Telephone Encounter (Signed)
Completed PA info in [place] for oxycodone 5mg .  Status pending.  Will recheck status in 24 hours. Christen Bame, CMA

## 2019-01-24 ENCOUNTER — Ambulatory Visit: Payer: Medicaid Other

## 2019-01-24 NOTE — Telephone Encounter (Signed)
Med approved 01/23/2019 - 07/22/2019.  Pharmacy informed.  Christen Bame, CMA

## 2019-01-29 ENCOUNTER — Telehealth: Payer: Self-pay | Admitting: Family Medicine

## 2019-01-29 ENCOUNTER — Other Ambulatory Visit: Payer: Self-pay | Admitting: Student in an Organized Health Care Education/Training Program

## 2019-01-29 NOTE — Chronic Care Management (AMB) (Signed)
  Care Management   Note  01/29/2019 Name: [name] MRN: [id number] DOB: [birth year]  [name] is a 54 y.o. year old female who is a primary care patient of [name], MD. I reached out to [employer] by phone today in response to a referral sent by [name]'s health plan.    [name] was given information about care management services today including:  1. Care management services include personalized support from designated clinical staff supervised by her physician, including individualized plan of care and coordination with other care providers 2. 24/7 contact phone numbers for assistance for urgent and routine care needs. 3. The patient may stop care management services at any time by phone call to the office staff.  Patient agreed to services and verbal consent obtained.   Follow up plan: Telephone appointment with care management team member scheduled for:02/02/2019  Elisha Ponder, LPN Health Advisor, Embedded Care Coordination [facility] Health Care Management ??[email or web address] ??[phone]

## 2019-01-30 ENCOUNTER — Ambulatory Visit: Payer: Medicaid Other

## 2019-01-30 NOTE — Progress Notes (Deleted)
  Subjective:   Patient ID: [name]    DOB: February 02, 1965, 54 y.o. female   MRN: [id number]  [name] is a 54 y.o. female with a history of HTN, OSA, asthma, pulmonary fibrosis, T2DM, DJD, morbid obesity, depression here for   R Leg Swelling - on lasix 20mg .  Having leg swelling for *** days. Location: *** Timing of swelling: *** New medications: *** Patient believes may be caused by ***  History of Kidney problems: *** History of Heart problems: *** History of Liver problems: *** History of cancer: ***  Symptoms Chest pain: *** Shortness of breath: *** Immobility: *** Black or bloody bowel movements: *** Severe snoring or day time sleepiness: *** Abdomen swelling: *** History of blood clots: ** Weight Loss: ***  Diabetes, Type 2 - Last A1c 11.7 09/2018 - Medications: trulicity 1.5mg  weekly, lantus 80u daily - Compliance: *** - Checking BG at home: *** - Diet: *** - Exercise: *** - Eye exam: due - Foot exam: due - Microalbumin: due - Statin: no - Denies symptoms of hypoglycemia, polyuria, polydipsia, numbness extremities, foot ulcers/trauma  Review of Systems:  Per HPI.  Medications and smoking status reviewed.  Objective:   There were no vitals taken for this visit. Vitals and nursing note reviewed.  General: well nourished, well developed, in no acute distress with non-toxic appearance HEENT: normocephalic, atraumatic, moist mucous membranes Neck: supple, non-tender without lymphadenopathy CV: regular rate and rhythm without murmurs, rubs, or gallops, no lower extremity edema Lungs: clear to auscultation bilaterally with normal work of breathing Abdomen: soft, non-tender, non-distended, no masses or organomegaly palpable, normoactive bowel sounds Skin: warm, dry, no rashes or lesions Extremities: warm and well perfused, normal tone MSK: ROM grossly intact, gait normal Neuro: Alert and oriented, speech normal  Assessment & Plan:   No  problem-specific Assessment & Plan notes found for this encounter.  No orders of the defined types were placed in this encounter.  No orders of the defined types were placed in this encounter.   10/2018, DO PGY-3,  Family Medicine 01/30/2019 11:54 AM

## 2019-02-02 ENCOUNTER — Other Ambulatory Visit: Payer: Self-pay | Admitting: Family Medicine

## 2019-02-02 ENCOUNTER — Other Ambulatory Visit: Payer: Self-pay

## 2019-02-02 ENCOUNTER — Telehealth: Payer: Medicaid Other | Admitting: Pharmacist

## 2019-02-09 ENCOUNTER — Other Ambulatory Visit: Payer: Self-pay | Admitting: Family Medicine

## 2019-02-09 DIAGNOSIS — E0810 Diabetes mellitus due to underlying condition with ketoacidosis without coma: Secondary | ICD-10-CM

## 2019-02-09 DIAGNOSIS — G8929 Other chronic pain: Secondary | ICD-10-CM

## 2019-02-09 DIAGNOSIS — I10 Essential (primary) hypertension: Secondary | ICD-10-CM

## 2019-02-12 MED ORDER — CLONIDINE HCL 0.1 MG PO TABS: 0.1000 mg | ORAL_TABLET | Freq: Three times a day (TID) | ORAL | 0 refills | Status: DC | PRN

## 2019-02-12 MED ORDER — PREGABALIN 50 MG PO CAPS: 50.0000 mg | ORAL_CAPSULE | Freq: Two times a day (BID) | ORAL | 0 refills | Status: DC

## 2019-02-12 MED ORDER — INSULIN GLARGINE 100 UNIT/ML SOLOSTAR PEN: 80.0000 [IU] | PEN_INJECTOR | SUBCUTANEOUS | 1 refills | Status: DC

## 2019-02-13 ENCOUNTER — Other Ambulatory Visit: Payer: Self-pay

## 2019-02-13 ENCOUNTER — Telehealth: Payer: Medicaid Other

## 2019-02-14 ENCOUNTER — Other Ambulatory Visit: Payer: Self-pay | Admitting: Family Medicine

## 2019-02-14 DIAGNOSIS — E0810 Diabetes mellitus due to underlying condition with ketoacidosis without coma: Secondary | ICD-10-CM

## 2019-02-14 DIAGNOSIS — I10 Essential (primary) hypertension: Secondary | ICD-10-CM

## 2019-02-15 ENCOUNTER — Telehealth: Payer: Medicaid Other

## 2019-02-15 ENCOUNTER — Other Ambulatory Visit: Payer: Self-pay

## 2019-02-16 ENCOUNTER — Other Ambulatory Visit: Payer: Self-pay

## 2019-02-16 ENCOUNTER — Encounter: Payer: Self-pay | Admitting: Family Medicine

## 2019-02-16 ENCOUNTER — Ambulatory Visit (INDEPENDENT_AMBULATORY_CARE_PROVIDER_SITE_OTHER): Payer: Medicaid Other | Admitting: Family Medicine

## 2019-02-16 VITALS — BP 160/90 | HR 111 | Ht 63.0 in | Wt 345.0 lb

## 2019-02-16 DIAGNOSIS — F339 Major depressive disorder, recurrent, unspecified: Secondary | ICD-10-CM | POA: Diagnosis not present

## 2019-02-16 DIAGNOSIS — E0810 Diabetes mellitus due to underlying condition with ketoacidosis without coma: Secondary | ICD-10-CM | POA: Diagnosis present

## 2019-02-16 LAB — POCT GLYCOSYLATED HEMOGLOBIN (HGB A1C): HbA1c, POC (controlled diabetic range): 7.8 % — AB (ref 0.0–7.0)

## 2019-02-16 MED ORDER — BUPROPION HCL ER (XL) 150 MG PO TB24: ORAL_TABLET | ORAL | 0 refills | Status: DC

## 2019-02-16 MED ORDER — FLUOXETINE HCL 20 MG PO TABS: ORAL_TABLET | ORAL | 0 refills | Status: DC

## 2019-02-16 NOTE — Patient Instructions (Signed)
Dear [name],   It was good to see you! Thank you for taking your time to come in to be seen. Today, we discussed the following:   Depression and Anxiety   Starting a medication called Wellbutrin. Start with 150 mg XL for 5 days and then increase to 300 mg per day. Take this medication in the morning.   Tomorrow, decrease the prozac to 20 mg daily. We will slowly taper your dose down as we do not want to take it out abruptly. Next Friday, you can take the Prozac every other day for a week and then discontinue it all together.   Things for you to follow up with   Follow up with the [facility]   Ask your grief counselor if they have any suggestions for weekly/biweekly counseling for depression and anxiety. We have also attached a list of providers in [place].   Please follow up in 2 weeks via Virtual Visit or sooner for concerning or worsening symptoms.    Be well,   Genia Hotter, M.D   [facility] [phone]  *Sign up for MyChart for instant access to your health profile, labs, orders, upcoming appointments or to contact your provider with questions*  ===================================================================================

## 2019-02-19 ENCOUNTER — Encounter: Payer: Self-pay | Admitting: Family Medicine

## 2019-02-19 MED ORDER — OXYCODONE HCL 5 MG PO TABS: 5.0000 mg | ORAL_TABLET | Freq: Four times a day (QID) | ORAL | 0 refills | Status: DC | PRN

## 2019-02-19 NOTE — Progress Notes (Signed)
  Subjective  [name] is a 54 y.o. female who presents for [facility] clinic:   Depression and Anxiety  Patient reports long history of depression and anxiety. She reports difficulty getting motivated to do anything during the day. She also notes anxiety about appointments and picking up the phone. She reports that she does not have many people that she hangs out with casually. Most of the people she knows drink or smoke or cuss, and she does not like being around these people. She will usually keep to herself. Patient reports that prozac is not doing much of anything for her anxiety or depression.   Objective  Physical Exam BP (!) 160/90   Pulse (!) 111   Ht 5\' 3"  (1.6 m)   Wt (!) 345 lb (156.5 kg)   SpO2 97%   BMI 61.11 kg/m  Well-appearing female, no acute distress.  Walks with walker with antalgic gait. Affect is mildly flat but otherwise does have full range of emotion  Assessment & Plan    Problem List Items Addressed This Visit      Active Problems   Depression, recurrent (HCC) (Chronic)    Dr. and I spoke to this patient and about her case at length. We have determined that her biggest barrier to improving her overall physical health is likely her mental health.  Patient has continued to have issues with losing weight, which is also likely contributing to diabetes and high blood pressure, but is having trouble managing most of her health issues due to depression and anxiety.  Thus, by addressing this at length, we think this will provide patient greatest benefit ultimately she is currently on prozac, but is not efficacious for patient. She reports wanting something that will help her get going. We agreed on starting wellbutrin XL and increase to 300 mg after 5 days of 150. Taper off prozac.  Have asked patient to call a counselor in [place] area to start following with.  She currently follows with a grief counselor after her husband's death, but would also benefit  from weekly to biweekly visits.  Will follow closely to make sure patient is able to get in touch with a counselor and through her treatment We have also asked patient to call bariatric surgery center.       Relevant Medications   buPROPion (WELLBUTRIN XL) 150 MG 24 hr tablet   FLUoxetine (PROZAC) 20 MG tablet   Diabetes (HCC) - Primary (Chronic)    A1C taken today as patient was due, but did not address this problem today as our concentration was on mental health. It is likely that her daibetes improves with obesity. Patient is going to set up with bariatric clinic. No changes to meds today. Follow up when able       Relevant Orders   HgB A1c (Completed)      Health Maintenance discussed with patient and patient agrees to address when able.  Health Maintenance Due  Topic Date Due  . OPHTHALMOLOGY EXAM  05/10/1976  . FOOT EXAM  09/13/2017    09/15/2017, M.D.  1:28 PM 02/19/2019

## 2019-02-19 NOTE — Assessment & Plan Note (Addendum)
Dr. Deirdre Priest and I spoke to this patient and about her case at length. We have determined that her biggest barrier to improving her overall physical health is likely her mental health.  Patient has continued to have issues with losing weight, which is also likely contributing to diabetes and high blood pressure, but is having trouble managing most of her health issues due to depression and anxiety.  Thus, by addressing this at length, we think this will provide patient greatest benefit ultimately she is currently on prozac, but is not efficacious for patient. She reports wanting something that will help her get going. We agreed on starting wellbutrin XL and increase to 300 mg after 5 days of 150. Taper off prozac.  Have asked patient to call a counselor in [place] area to start following with.  She currently follows with a grief counselor after her husband's death, but would also benefit from weekly to biweekly visits.  Will follow closely to make sure patient is able to get in touch with a counselor and through her treatment We have also asked patient to call bariatric surgery center.

## 2019-02-19 NOTE — Assessment & Plan Note (Signed)
A1C taken today as patient was due, but did not address this problem today as our concentration was on mental health. It is likely that her daibetes improves with obesity. Patient is going to set up with bariatric clinic. No changes to meds today. Follow up when able

## 2019-02-20 ENCOUNTER — Other Ambulatory Visit: Payer: Self-pay

## 2019-02-20 DIAGNOSIS — I10 Essential (primary) hypertension: Secondary | ICD-10-CM

## 2019-02-20 MED ORDER — HYDROCHLOROTHIAZIDE 25 MG PO TABS: 25.0000 mg | ORAL_TABLET | Freq: Every day | ORAL | 11 refills | Status: DC

## 2019-02-26 ENCOUNTER — Telehealth (INDEPENDENT_AMBULATORY_CARE_PROVIDER_SITE_OTHER): Payer: Medicaid Other | Admitting: Family Medicine

## 2019-02-26 ENCOUNTER — Other Ambulatory Visit: Payer: Self-pay

## 2019-02-26 DIAGNOSIS — F339 Major depressive disorder, recurrent, unspecified: Secondary | ICD-10-CM

## 2019-02-27 ENCOUNTER — Encounter: Payer: Self-pay | Admitting: Family Medicine

## 2019-02-27 NOTE — Progress Notes (Signed)
Patient did not answer.  Will attempt to call and reschedule appointment.

## 2019-03-09 ENCOUNTER — Other Ambulatory Visit: Payer: Self-pay | Admitting: *Deleted

## 2019-03-10 MED ORDER — CLONIDINE 0.3 MG/24HR TD PTWK: 0.3000 mg | MEDICATED_PATCH | TRANSDERMAL | 3 refills | Status: DC

## 2019-03-14 ENCOUNTER — Other Ambulatory Visit: Payer: Self-pay | Admitting: Family Medicine

## 2019-03-16 ENCOUNTER — Telehealth: Payer: Self-pay | Admitting: Family Medicine

## 2019-03-16 ENCOUNTER — Other Ambulatory Visit: Payer: Self-pay | Admitting: Family Medicine

## 2019-03-16 DIAGNOSIS — F339 Major depressive disorder, recurrent, unspecified: Secondary | ICD-10-CM

## 2019-03-16 DIAGNOSIS — I10 Essential (primary) hypertension: Secondary | ICD-10-CM

## 2019-03-16 NOTE — Addendum Note (Signed)
Addended by: [name] on: 03/16/2019 05:46 PM   Modules accepted: Orders

## 2019-03-16 NOTE — Telephone Encounter (Addendum)
Called patient to ask about Lasix as she takes the medication as needed (not included in her monthly pill pack) and just had a refill 3 weeks ago.  Wanted to ensure that patient is not taking Lasix daily.  She reports that she is taking it as needed and it was likely a mistake for it to be refilled and just sent by her pharmacy.  Refused medication request.  Also spoke to patient about recently missed follow-up appointment.  She states that she is continuing to working with her grief counselor about finding a Veterinary surgeon for depression.  She asked if it were possible for her to get a life coach or someone along those lines. Patient would be a great candidate for CCM and have discussed this in the past. Will send out referral today.    [name], M.D.  5:38 PM 03/16/2019

## 2019-03-19 ENCOUNTER — Telehealth: Payer: Self-pay | Admitting: Family Medicine

## 2019-03-19 NOTE — Chronic Care Management (AMB) (Signed)
   Care Management   Note  03/19/2019 Name: [name] MRN: [id number] DOB: [birth year]  [name] is a 54 y.o. year old female who is a primary care patient of Selena Batten, Vinnie Langton, MD. [name] is currently enrolled in care management services. An additional referral for LCSW was placed.   Follow up plan: Telephone appointment with care management team member scheduled for:03/21/2019  Elisha Ponder, LPN Health Advisor, Embedded Care Coordination [facility] Health Care Management ??[email or web address] ??[phone]

## 2019-03-21 ENCOUNTER — Other Ambulatory Visit: Payer: Self-pay

## 2019-03-21 ENCOUNTER — Encounter: Payer: Self-pay | Admitting: Licensed Clinical Social Worker

## 2019-03-21 ENCOUNTER — Ambulatory Visit: Payer: Medicaid Other | Admitting: Licensed Clinical Social Worker

## 2019-03-21 DIAGNOSIS — F339 Major depressive disorder, recurrent, unspecified: Secondary | ICD-10-CM

## 2019-03-21 DIAGNOSIS — Z741 Need for assistance with personal care: Secondary | ICD-10-CM

## 2019-03-21 DIAGNOSIS — Z7189 Other specified counseling: Secondary | ICD-10-CM

## 2019-03-21 NOTE — Chronic Care Management (AMB) (Signed)
Care Management   Clinical Social Work initial Note  03/21/2019 Name: [name] MRN: [id number] DOB: 12-04-1965   The Care Management team was consulted by PCP to assist the patient with Level of Care Concerns and Mental Health Counseling and Resources.   [name] is a 54 y.o. year old female who sees [name], [name], MD for primary care. LCSW reached out to [employer] today by phone to introduce self, assess needs and barriers to care.   Assessment: Patient is pleasant and engaged in conversation. After assessment it appears that patient may benefit from and is in agreement to [facility] She continues with grief counseling and is open to contacting her previous counseling agency to reestablish with them.   Review of patient status, including review of consultants reports, relevant laboratory and other test results, and collaboration with appropriate care team members and the patient's provider was performed as part of comprehensive patient evaluation and provision of chronic care management services.    Advance Directive Status: Not addressed during this encounter  SDOH (Social Determinants of Health) assessments performed: Yes:  SDOH Interventions     Most Recent Value  SDOH Interventions  SDOH Interventions for the Following Domains  Depression  Depression Interventions/Treatment   Currently on Treatment [assisting with connecting to ongoing counseling]      Goals Addressed            This Visit's Progress   . Connect with counselor       CARE PLAN ENTRY (see longtitudinal plan of care for additional care plan information) Current Barriers:  . Patient with HTN, DMII, and Depression acknowledges deficits with connecting to mental health provider for ongoing counseling   . Patient is experiencing symptoms of depression which seem to be exacerbated by chronic medical conditions and grief.     . Patient needs Support, Education, and Care Coordination in  order to meet unmet mental health needs  Clinical Social Work Goal(s):  Marland Kitchen Over the next 30 days, patient will work with SW bi-weekly by telephone to reduce or manage symptoms of stress until connected for ongoing counseling resources.  Interventions:  . Assessed patient's understanding, education, previous treatment and care coordination needs  . Discussed several options for long term counseling based on need and insurance. Assisted patient with narrowing the options down to ([facility] ) . Reviewed mental health medications with patient prescribed by PCP and discussed compliance  . Other interventions include: Solution-Focused Strategies  Patient Self Care Activities & Deficits:  . Patient is unable to independently navigate community resource options without care coordination support . Patient is able to implement clinical interventions discussed today and is motivated for treatment  . Patient will call [facility] [phone] to schedule an appointment  Initial goal documentation     . Personal Care Services Aide       CARE PLAN ENTRY (see longtitudinal plan of care for additional care plan information) Current Barriers:   . Patient with HTN, DMII, and Depression needs Support, Education, and Care Coordination to resolve unmet Personal Care needs . ADL IADL limitations . Patient needs PCP to complete PCS Referral Clinical Social Work Goal(s):  Marland Kitchen Over the next 30 to 45 days, patient will have personal care needs met as evident by having PCS Aide in the home assisting with needs. . Over the next 30 days patient will work with LCSW, and [facility] to coordinate care for [facility] and select a personal  care service provider  Return calls from [facility] to set up initial PCS assessment once application is approved Call [facility] with questions [phone] or [phone]  Interventions provided by LCSW : . Assessed needs, level of care concerns, basic eligibility  and provided education on Personal Care Service process,  . Collaborate with primary care provider ref completing PCS referral ( Referral placed in mailbox) . PCS referral will be faxed to [employer] at [phone] once completed and signed by PCP . LCSW will collaborate with [facility] to verify application is received and processed.  Patient Self Care Activities & Deficits:  . Call provider office for new concerns or questions . Review list of PCS providers and call to see if any will accompany to her medical appointments . Select at least 3 providers from the list . Family/support system will assist patient with meeting needs until [facility] is approved Initial goal documentation      Outpatient Encounter Medications as of 03/21/2019  Medication Sig  . ACCU-CHEK AVIVA PLUS test strip USE AS INSTRUCTED  . acetaminophen (TYLENOL) 500 MG tablet Take 500 mg by mouth every 6 (six) hours as needed.  Marland Kitchen albuterol (PROAIR HFA) 108 (90 Base) MCG/ACT inhaler Inhale 2 puffs into the lungs every 4 (four) hours as needed.  Marland Kitchen amLODipine (NORVASC) 5 MG tablet Take 1 tablet (5 mg total) by mouth at bedtime.  . Blood Glucose Monitoring Suppl (ACCU-CHEK AVIVA PLUS) w/Device KIT 1 each by Does not apply route 3 (three) times daily after meals.  . budesonide-formoterol (SYMBICORT) 160-4.5 MCG/ACT inhaler Inhale 2 puffs into the lungs 2 (two) times daily.  Marland Kitchen buPROPion (WELLBUTRIN XL) 300 MG 24 hr tablet Take 1 tablet (300 mg total) by mouth daily.  . cetirizine (ZYRTEC) 10 MG tablet Take 1 tablet (10 mg total) by mouth daily.  . cloNIDine (CATAPRES) 0.1 MG tablet PLEASE SEE ATTACHED FOR DETAILED DIRECTIONS  . cloNIDine (CATAPRES-TTS-3) 0.3 mg/24hr patch Place 1 patch (0.3 mg total) onto the skin once a week.  Marland Kitchen FLUoxetine (PROZAC) 20 MG tablet Take 1 tablet (20 mg total) by mouth daily for 7 days, THEN 1 tablet (20 mg total) every other day for 7 days.  . furosemide (LASIX) 20 MG tablet  TAKE 1 TABLET (20 MG TOTAL) BY MOUTH DAILY AS NEEDED.  . hydrochlorothiazide (HYDRODIURIL) 25 MG tablet Take 1 tablet (25 mg total) by mouth daily.  . Insulin Glargine (LANTUS) 100 UNIT/ML Solostar Pen Inject 80 Units into the skin every morning. Increase 2 units daily if CBG > 100.  . Insulin Pen Needle (PEN NEEDLES 3/16") 31G X 5 MM MISC Use with Lantus solostar  . Lancets (ACCU-CHEK SOFT TOUCH) lancets Use as instructed  . montelukast (SINGULAIR) 10 MG tablet TAKE 1 TABLET BY MOUTH EVERYDAY AT BEDTIME  . omeprazole (PRILOSEC) 40 MG capsule Take 1 capsule (40 mg total) by mouth daily.  Marland Kitchen oxybutynin (DITROPAN) 5 MG tablet TAKE 1 TABLET BY MOUTH TWICE A DAY  . oxyCODONE (OXY IR/ROXICODONE) 5 MG immediate release tablet Take 1 tablet (5 mg total) by mouth every 6 (six) hours as needed for severe pain.  . polyethylene glycol (MIRALAX / GLYCOLAX) packet Take 17 g by mouth daily as needed for mild constipation.  . pregabalin (LYRICA) 50 MG capsule Take 1 capsule (50 mg total) by mouth 2 (two) times daily.  . VOLTAREN 1 % GEL APPLY 2 G TOPICALLY 4 (FOUR) TIMES DAILY AS NEEDED (KNEE PAIN). (Patient not  taking: Reported on 09/14/2018)   No facility-administered encounter medications on file as of 03/21/2019.      Information about Care Management services was shared with [name] today including:  1. Care Management services include personalized support from designated clinical staff supervised by her physician, including individualized plan of care and coordination with other care providers 2. Remind patient of 24/7 contact phone numbers to provider's office for assistance with urgent and routine care needs. 3. Care Management services are voluntary and patient may stop at any time .  Patient agreed to services provided today and verbal consent obtained.   Follow Up Plan: SW will follow up with patient by phone over the next 2 weeks      Casimer Lanius, [place]   [phone] 11:25 AM

## 2019-03-21 NOTE — Patient Instructions (Signed)
Licensed Clinical Social Worker Visit Information Ms. Hedden  it was nice speaking with you. Please call me directly if you have questions [phone] Goals we discussed today:  Goals Addressed            This Visit's Progress   . Connect with counselor       CARE PLAN ENTRY (see longtitudinal plan of care for additional care plan information) Current Barriers:  . Patient with HTN, DMII, and Depression acknowledges deficits with connecting to mental health provider for ongoing counseling   . Patient is experiencing symptoms of depression which seem to be exacerbated by chronic medical conditions and grief.     . Patient needs Support, Education, and Care Coordination in order to meet unmet mental health needs  Clinical Social Work Goal(s):  Marland Kitchen Over the next 30 days, patient will work with SW bi-weekly by telephone to reduce or manage symptoms of stress until connected for ongoing counseling resources.  Interventions:  . Assessed patient's understanding, education, previous treatment and care coordination needs  . Discussed several options for long term counseling based on need and insurance. Assisted patient with narrowing the options down to ([facility] ) . Reviewed mental health medications with patient prescribed by PCP and discussed compliance  . Other interventions include: Solution-Focused Strategies  Patient Self Care Activities & Deficits:  . Patient is unable to independently navigate community resource options without care coordination support . Patient is able to implement clinical interventions discussed today and is motivated for treatment  . Patient will call [facility] [phone] to schedule an appointment  Initial goal documentation     . Personal Care Services Aide       CARE PLAN ENTRY (see longtitudinal plan of care for additional care plan information) Current Barriers:   . Patient with HTN, DMII, and Depression needs Support, Education, and Care  Coordination to resolve unmet Personal Care needs . ADL IADL limitations . Patient needs PCP to complete PCS Referral Clinical Social Work Goal(s):  Marland Kitchen Over the next 30 to 45 days, patient will have personal care needs met as evident by having PCS Aide in the home assisting with needs. . Over the next 30 days patient will work with LCSW, and [facility] to coordinate care for [facility] and select a personal care service provider  Return calls from [facility] to set up initial PCS assessment once application is approved Call [facility] with questions [phone] or [phone]  Interventions provided by LCSW : . Assessed needs, level of care concerns, basic eligibility and provided education on Personal Care Service process,  . Collaborate with primary care provider ref completing PCS referral ( Referral placed in mailbox) . PCS referral will be faxed to [employer] at [phone] once completed and signed by PCP . LCSW will collaborate with [facility] to verify application is received and processed.  Patient Self Care Activities & Deficits:  . Call provider office for new concerns or questions . Review list of PCS providers and call to see if any will accompany to her medical appointments . Select at least 3 providers from the list . Family/support system will assist patient with meeting needs until [facility] is approved Initial goal documentation     Materials provided: Yes: list of PCS providers Ms. Rosato received Care Management services today:  1. Care Management services include personalized support from designated clinical staff supervised by her physician, including individualized plan of care and coordination with other  care providers 2. 24/7 contact [phone] for assistance for urgent and routine care needs. 3. Care Management are voluntary services and be declined at any time by calling the office.  Patient  verbally agreed to  assistance and services provided by embedded care coordination/care management team today.   Patient verbalizes understanding of instructions provided today and will review copy in [facility]. Follow up plan:  SW will follow up with patient by phone over the next 2 weeks 04/04/19 2:30  Maurine Cane, LCSW

## 2019-03-22 ENCOUNTER — Ambulatory Visit: Payer: Self-pay

## 2019-03-22 NOTE — Chronic Care Management (AMB) (Signed)
   RN Case Manager Care Management Consultation  03/22/2019 Name: [name] MRN: [id number] DOB: 1965-06-17   [name] is a 54 y.o. year old female who sees [name], MD for primary care. RN Case Manager was consulted by LCSW for information /resources to assistance patient with  Disease Management Educational Needs. Patient was not seen during this encounter however RN Case Manager reviewed chart, notes, insurance and collaboration etc.. .     Recommendation: After consultation with LCSW it is determined that patient may benefit with dealing with anxiey and depression issues through counseling that would help her physical issues  Intervention: Relevant information and resources discussed with LCSW  Plan: No further interventions needed at this time from RN case Manager            If any issues are identified in the future LCSW will notify RN Case Manager  Juanell Fairly RN, BSN, [facility] Care Management Coordinator [facility] Phone: [phone] Fax: [phone]

## 2019-04-03 ENCOUNTER — Other Ambulatory Visit: Payer: Self-pay

## 2019-04-03 ENCOUNTER — Telehealth: Payer: Self-pay | Admitting: Clinical

## 2019-04-03 ENCOUNTER — Encounter: Payer: Self-pay | Admitting: Family Medicine

## 2019-04-03 ENCOUNTER — Telehealth (INDEPENDENT_AMBULATORY_CARE_PROVIDER_SITE_OTHER): Payer: Medicaid Other | Admitting: Family Medicine

## 2019-04-03 DIAGNOSIS — Z6841 Body Mass Index (BMI) 40.0 and over, adult: Secondary | ICD-10-CM | POA: Diagnosis not present

## 2019-04-03 DIAGNOSIS — F339 Major depressive disorder, recurrent, unspecified: Secondary | ICD-10-CM

## 2019-04-03 NOTE — Patient Instructions (Addendum)
Dear [name],   It was great to talk to you today!!  Today, we discussed the following:   Follow Up   Continue taking the Wellbutrin at 300 mg   Call [facility] back for counseling   Call [facility] to re-establish care.   You have a call with Gavin Pound tomorrow at 2:30 PM to follow up on aide services   Be well,   Genia Hotter, M.D   [facility] [phone]  *Sign up for MyChart for instant access to your health profile, labs, orders, upcoming appointments or to contact your provider with questions*  ===================================================================================

## 2019-04-03 NOTE — Progress Notes (Signed)
[facility] Telemedicine Visit  Patient consented to have virtual visit. Method of visit: Telephone  Encounter participants: Patient: [name] - located at [facility]  Provider: [name] - located at [facility]  Others (if applicable): n/a   HPI/CC Depression/anxiety follow up  Feeling the same with no significant changes in her mood. Today, she feels fine, calm and peaceful. Still feels difficult to leave the house. No side effects from Wellbutrin. Hass been on the 300 mg dose x 2 weeks. Had no issues coming off of Prozac.  She called [facility] to get in touch with a counselor.  Still had struggles getting herself together today to go and get her COVID shot, but that was motivation enough.   Weight loss  Hasn't called anyone just yet. Was going to try to call [facility] for follow up. Patient reports that now that the weather is nicer, it will be easier for her to call in getting started.  ROS: per HPI  Pertinent PMHx: Depression, anxiety, elevated BMI, hypertension, chronic pain  Exam:  Respiratory: Speaking in full sentences.  No respiratory distress  Assessment/Plan:  Depression, recurrent (HCC) Followed up with patient via virtual visit today.  She is doing well with her medications but does not notice any significant changes in her mood and still found it difficult to leave her house this morning.  She does report feeling good about leaving the house and going to get a Covid shot today.  I encourage patient to remember the positive feelings that were associated with leaving the house today and to be proud of herself for overcoming and for pushing for her to leave the house.  Continue Wellbutrin.  Will follow up with patient in 1 month to check in on [facility] counseling and medication follow-up.  BMI 60.0-69.9, adult (HCC) At last visit, spoke to patient about reaching out to [facility] to reestablish care.  Patient is  agreeable to calling them.  We will follow-up with patient on this progress in 1 month.    Time spent during visit with patient: 15 minutes  [name], M.D.  2:01 PM 04/03/2019

## 2019-04-03 NOTE — Assessment & Plan Note (Signed)
Followed up with patient via virtual visit today.  She is doing well with her medications but does not notice any significant changes in her mood and still found it difficult to leave her house this morning.  She does report feeling good about leaving the house and going to get a Covid shot today.  I encourage patient to remember the positive feelings that were associated with leaving the house today and to be proud of herself for overcoming and for pushing for her to leave the house.  Continue Wellbutrin.  Will follow up with patient in 1 month to check in on [facility] and medication follow-up.

## 2019-04-03 NOTE — Telephone Encounter (Signed)
*  corrected note. LCSW attempted to reach patient who requested to speak with social worker here at [employer]. LCSW unable to leave VM as it states it was full.

## 2019-04-03 NOTE — Assessment & Plan Note (Signed)
At last visit, spoke to patient about reaching out to [facility] to reestablish care.  Patient is agreeable to calling them.  We will follow-up with patient on this progress in 1 month.

## 2019-04-04 ENCOUNTER — Telehealth: Payer: Self-pay | Admitting: Clinical

## 2019-04-04 ENCOUNTER — Other Ambulatory Visit: Payer: Self-pay

## 2019-04-04 ENCOUNTER — Ambulatory Visit: Payer: Medicaid Other | Admitting: Licensed Clinical Social Worker

## 2019-04-04 DIAGNOSIS — Z7189 Other specified counseling: Secondary | ICD-10-CM

## 2019-04-04 DIAGNOSIS — Z741 Need for assistance with personal care: Secondary | ICD-10-CM

## 2019-04-04 DIAGNOSIS — F339 Major depressive disorder, recurrent, unspecified: Secondary | ICD-10-CM

## 2019-04-04 NOTE — Patient Instructions (Signed)
Licensed Clinical Social Worker Visit Information Ms. Burd  it was nice speaking with you. Please call me directly if you have questions [phone] Goals we discussed today:  Goals Addressed            This Visit's Progress   . Connect with counselor   On track    [place] (see longtitudinal plan of care for additional care plan information) Current Barriers & Progress:  . Patient with HTN, DMII, and Depression acknowledges deficits with connecting to mental health provider for ongoing counseling   . Patient is experiencing symptoms of depression which seem to be exacerbated by chronic medical conditions and grief.     . Patient needs Support, Education, and Care Coordination in order to meet unmet mental health needs  . Patient called [facility] to schedule appointment however they are able to meet her ongoing counseling needs Clinical Social Work Goal(s):  Marland Kitchen Over the next 30 days, patient will work with SW bi-weekly by telephone to reduce or manage symptoms of stress until connected for ongoing counseling resources.  Interventions:  . Assessed patient's treatment and care coordination needs  . Discussed several options for long term counseling based on need and insurance.  . Assisted patient with contacting [facility] [phone]) intake completed today by Caryl Pina. . Other interventions include: Solution-Focused Strategies and relaxed breathing . E-mail EMMI Education video on Breathing to Relax and Relieving Stress Patient Self Care Activities & Deficits:  . Patient is unable to independently navigate community resource options without care coordination support . Patient is able to implement clinical interventions discussed today and is motivated for treatment  . Patient will wait for [facility] [phone]) to contact her to complete the assessment and will F/U if they have not called. Please see past updates related to this goal by clicking on the  "Past Updates" button in the selected goal      . [place]   Not on track    [place] (see longtitudinal plan of care for additional care plan information) Current Barriers:   . Patient with HTN, DMII, and Depression needs Support, Education, and Care Coordination to resolve unmet Personal Care needs . ADL IADL limitations . Patient needs PCP to complete PCS Referral ( referral placed in mailbox) Clinical Social Work Goal(s):  Marland Kitchen Over the next 30 to 45 days, patient will have personal care needs met as evident by having PCS Aide in the home assisting with needs. . Over the next 30 days patient will work with LCSW, and [facility] to coordinate care for [facility] and select a personal care service provider  . Return calls from [facility] to set up initial PCS assessment once application is approved . Call [facility] with questions [phone] or [phone]  Interventions provided by LCSW : . Assessed needs, level of care concerns, basic eligibility and provided education on Personal Care Service process,  . Collaborate with primary care provider ref completing PCS referral ( Referral placed in mailbox) . PCS referral will be faxed to [employer] at [phone] once completed and signed by PCP . LCSW will collaborate with [facility] to verify application is received and processed.  Patient Self Care Activities & Deficits:  . Call provider office for new concerns or questions . Patient has review list of PCS providers and selected 3 providers from the list . Family/support system will assist patient with meeting needs until [facility] is approved  Please see past updates related to this goal by clicking on the "Past Updates" button in the selected goal       Materials provided: Yes: EMMI educational material  Ms. Connors received Care Management services today:  1. Care Management services include personalized support from  designated clinical staff supervised by her physician, including individualized plan of care and coordination with other care providers 2. 24/7 contact [phone] for assistance for urgent and routine care needs. 3. Care Management are voluntary services and be declined at any time by calling the office.  Patient  verbally agreed to assistance and services provided by embedded care coordination/care management team today.   Patient verbalizes understanding of instructions provided today.  Follow up plan:  SW will follow up with patient by phone over the next 2 weeks  Maurine Cane, LCSW

## 2019-04-04 NOTE — Telephone Encounter (Signed)
Social worker attempted to reach out to patient who left number at clinic. Social worker called (2x) no answer. VM is full unable to leave VM.

## 2019-04-04 NOTE — Telephone Encounter (Signed)
Social worker spoke with patient seeking counseling. Social worker will be in contact for appointment. Social worker received call from LCSW who completed referral seeking additional information if clinic is offering only short or long term. This Child psychotherapist will be in contact with LCSW upon receiving clarification.

## 2019-04-04 NOTE — Chronic Care Management (AMB) (Signed)
Care Management   Clinical Social Work Follow Up   04/04/2019 Name: [name] MRN: [id number] DOB: 1965-11-26  Referred by: Sara Oliphant, MD  Reason for referral : Care Coordination ((PCS and ongoing counseling)) [name] is a 54 y.o. year old female who is a primary care patient of [name], Sara Quale, MD.   Reason for follow-up: Phone encounter with patient today for ongoing assessment and brief interventions to assist with connecting for ongoing counseling and setting up [place].   Assessment: Patient reached out to [facility] to schedule counseling appointment, however they are unable to meet her ongoing therapy needs. Patient completed intake today with [name].  Review of patient status, including review of consultants reports, relevant laboratory and other test results, and collaboration with appropriate care team members and the patient's provider was performed as part of comprehensive patient evaluation and provision of care management services.    Advance Directive Status: Not addressed in this encounter SDOH (Social Determinants of Health) assessments performed: Yes SDOH Interventions     Most Recent Value  SDOH Interventions  SDOH Interventions for the Following Domains  Depression  Depression Interventions/Treatment   Counseling [referral for on-going counseling]      Goals Addressed            This Visit's Progress   . Connect with counselor   On track    [name] (see longtitudinal plan of care for additional care plan information) Current Barriers & Progress:  . Patient with HTN, DMII, and Depression acknowledges deficits with connecting to mental health provider for ongoing counseling   . Patient is experiencing symptoms of depression which seem to be exacerbated by chronic medical conditions and grief.     . Patient needs Support, [name], and Care Coordination in order to meet unmet mental health needs  . Patient  called [facility] to schedule appointment however they are able to meet her ongoing counseling needs Clinical Social Work Goal(s):  Marland Kitchen Over the next 30 days, patient will work with SW bi-weekly by telephone to reduce or manage symptoms of stress until connected for ongoing counseling resources.  Interventions:  . Assessed patient's treatment and care coordination needs  . Discussed several options for long term counseling based on need and insurance.  . Assisted patient with contacting [name] [phone]) intake completed today by [name]. . Other interventions include: Solution-Focused Strategies and relaxed breathing . E-mail [name] video on Breathing to Relax and Relieving Stress Patient Self Care Activities & Deficits:  . Patient is unable to independently navigate community resource options without care coordination support . Patient is able to implement clinical interventions discussed today and is motivated for treatment  . Patient will wait for [name] [phone]) to contact her to complete the assessment and will F/U if they have not called. Please see past updates related to this goal by clicking on the "Past Updates" button in the selected goal     . [name]   Not on track    [name] (see longtitudinal plan of care for additional care plan information) Current Barriers:   . Patient with HTN, DMII, and Depression needs Support, [name], and Care Coordination to resolve unmet Personal Care needs . ADL IADL limitations . Patient needs PCP to complete PCS Referral ( referral placed in mailbox) Clinical Social Work Goal(s):  Marland Kitchen Over the next 30 to 45 days, patient will have personal care needs met  as evident by having PCS Aide in the home assisting with needs. . Over the next 30 days patient will work with [name], and [name] to coordinate care for [name] and select a personal care service provider  . Return calls  from [name] to set up initial PCS assessment once application is approved . Call [name] with questions [phone] or [phone]  Interventions provided by [name] : . Assessed needs, level of care concerns, basic eligibility and provided [name] on Personal Care Service process,  . Collaborate with primary care provider ref completing PCS referral ( Referral placed in mailbox) . PCS referral will be faxed to [employer] at [phone] once completed and signed by PCP . [name] will collaborate with [name] to verify application is received and processed.  Patient Self Care Activities & Deficits:  . Call provider office for new concerns or questions . Patient has review list of PCS providers and selected 3 providers from the list . Family/support system will assist patient with meeting needs until [facility] is approved Please see past updates related to this goal by clicking on the "Past Updates" button in the selected goal        Outpatient Encounter Medications as of 04/04/2019  Medication Sig  . ACCU-CHEK AVIVA PLUS test strip USE AS INSTRUCTED  . acetaminophen (TYLENOL) 500 MG tablet Take 500 mg by mouth every 6 (six) hours as needed.  Marland Kitchen albuterol (PROAIR HFA) 108 (90 Base) MCG/ACT inhaler Inhale 2 puffs into the lungs every 4 (four) hours as needed.  Marland Kitchen amLODipine (NORVASC) 5 MG tablet Take 1 tablet (5 mg total) by mouth at bedtime.  . Blood Glucose Monitoring Suppl (ACCU-CHEK AVIVA PLUS) w/Device KIT 1 each by Does not apply route 3 (three) times daily after meals.  . budesonide-formoterol (SYMBICORT) 160-4.5 MCG/ACT inhaler Inhale 2 puffs into the lungs 2 (two) times daily.  Marland Kitchen buPROPion (WELLBUTRIN XL) 300 MG 24 hr tablet Take 1 tablet (300 mg total) by mouth daily.  . cetirizine (ZYRTEC) 10 MG tablet Take 1 tablet (10 mg total) by mouth daily.  . cloNIDine (CATAPRES) 0.1 MG tablet PLEASE SEE ATTACHED FOR DETAILED DIRECTIONS  .  cloNIDine (CATAPRES-TTS-3) 0.3 mg/24hr patch Place 1 patch (0.3 mg total) onto the skin once a week.  Marland Kitchen FLUoxetine (PROZAC) 20 MG tablet Take 1 tablet (20 mg total) by mouth daily for 7 days, THEN 1 tablet (20 mg total) every other day for 7 days.  . furosemide (LASIX) 20 MG tablet TAKE 1 TABLET (20 MG TOTAL) BY MOUTH DAILY AS NEEDED.  . hydrochlorothiazide (HYDRODIURIL) 25 MG tablet Take 1 tablet (25 mg total) by mouth daily.  . Insulin Glargine (LANTUS) 100 UNIT/ML Solostar Pen Inject 80 Units into the skin every morning. Increase 2 units daily if CBG > 100.  . Insulin Pen Needle (PEN NEEDLES 3/16") 31G X 5 MM MISC Use with Lantus solostar  . Lancets (ACCU-CHEK SOFT TOUCH) lancets Use as instructed  . montelukast (SINGULAIR) 10 MG tablet TAKE 1 TABLET BY MOUTH EVERYDAY AT BEDTIME  . omeprazole (PRILOSEC) 40 MG capsule Take 1 capsule (40 mg total) by mouth daily.  Marland Kitchen oxybutynin (DITROPAN) 5 MG tablet TAKE 1 TABLET BY MOUTH TWICE A DAY  . oxyCODONE (OXY IR/ROXICODONE) 5 MG immediate release tablet Take 1 tablet (5 mg total) by mouth every 6 (six) hours as needed for severe pain.  . polyethylene glycol (MIRALAX / GLYCOLAX) packet Take 17 g by mouth daily  as needed for mild constipation.  . pregabalin (LYRICA) 50 MG capsule Take 1 capsule (50 mg total) by mouth 2 (two) times daily.  . VOLTAREN 1 % GEL APPLY 2 G TOPICALLY 4 (FOUR) TIMES DAILY AS NEEDED (KNEE PAIN). (Patient not taking: Reported on 09/14/2018)   No facility-administered encounter medications on file as of 04/04/2019.   Plan: [name] will follow up with patient in 2 weeks  Casimer Lanius, [place] / [place]   [phone] 3:50 PM

## 2019-04-05 ENCOUNTER — Ambulatory Visit: Payer: Self-pay | Admitting: Licensed Clinical Social Worker

## 2019-04-05 NOTE — Chronic Care Management (AMB) (Signed)
   Social Work  Care Management Collaboration 04/05/2019 Name: DELILA KUKLINSKI MRN: [id number] DOB: Jun 22, 1965  [name] is a 54 y.o. year old female who sees [name], [name], MD for primary care. LCSW was consulted to assistance patient with  Level of Care Concerns. Patient was not interviewed or contacted during this encounter.    Intervention: LCSW collaborated with PCP and faxed information to [facility].  Review of patient status, including review of consultants reports, relevant laboratory and other test results, and collaboration with appropriate care team members and the patient's provider was performed as part of comprehensive patient evaluation and provision of chronic care management services.   Goals Addressed            This Visit's Progress   . Personal Care Services Aide   On track    CARE PLAN ENTRY (see longtitudinal plan of care for additional care plan information) Current Barriers:   . Patient with HTN, DMII, and Depression needs Support, Education, and Care Coordination to resolve unmet Personal Care needs . ADL IADL limitations . PCS referral signed by PCP Clinical Social Work Goal(s):  Marland Kitchen Over the next 30 to 45 days, patient will have personal care needs met as evident by having PCS Aide in the home assisting with needs. . Over the next 30 days patient will work with LCSW, and [facility] to coordinate care for [facility] and select a personal care service provider  . Return calls from [facility] to set up initial PCS assessment once application is approved . Call [facility] with questions [phone] or [phone]  Interventions provided by LCSW : . Assessed needs, level of care concerns, basic eligibility and provided education on Personal Care Service process,  . Collaborate with primary care provider ref completing PCS referral ( Referral placed in mailbox) . PCS referral faxed to [employer] at  [phone]  . LCSW will collaborate with [facility] to verify application is received and processed. Patient Self Care Activities & Deficits:  . Call provider office for new concerns or questions . Patient has review list of PCS providers and selected 3 providers from the list . Family/support system will assist patient with meeting needs until [facility] is approved Please see past updates related to this goal by clicking on the "Past Updates" button in the selected goal      Plan: LCSW will F/U with [facility] in 3 to 5 days  Casimer Lanius, [place] / [place]   [phone] 2:20 PM

## 2019-04-06 ENCOUNTER — Telehealth: Payer: Self-pay | Admitting: Clinical

## 2019-04-06 NOTE — Telephone Encounter (Signed)
Social worker first contacted referral social worker who informed patient seeked elsewhere for therapy. This Child psychotherapist contacted patient to verify she seek elsewhere and if she feels other does not meet her needs to contact this Child psychotherapist here at [facility]. Patient thanked Child psychotherapist for call back.

## 2019-04-12 ENCOUNTER — Ambulatory Visit: Payer: Self-pay | Admitting: Licensed Clinical Social Worker

## 2019-04-12 NOTE — Chronic Care Management (AMB) (Signed)
   Social Work  Care Management Collaboration 04/12/2019 Name: JAYLENA HOLLOWAY MRN: [id number] DOB: [birth year]   [name] is a 54 y.o. year old female who sees [name], [name], MD for primary care. LCSW was consulted to assistance patient with  Level of Care Concerns for [facility] referral. Patient was not interviewed or contacted during this encounter however LCSW collaborated with [facility].  Intervention:  Review of patient status, including review of consultants reports, relevant laboratory and other test results, and collaboration with appropriate care team members and the patient's provider was performed as part of comprehensive patient evaluation and provision of chronic care management services.   Goals Addressed            This Visit's Progress   . Personal Care Services Aide   On track    CARE PLAN ENTRY (see longtitudinal plan of care for additional care plan information) Current Barriers & Progress:   . Patient with HTN, DMII, and Depression needs Support, Education, and Care Coordination to resolve unmet Personal Care needs . ADL IADL limitations . PCS referral signed by PCP and faxed to [facility] . [place] have made attempts to contact patient to schedule assessment Clinical Social Work Goal(s):  Marland Kitchen Over the next 30 to 45 days, patient will have personal care needs met as evident by having PCS Aide in the home assisting with needs. . Over the next 30 days patient will work with LCSW, and [facility] to coordinate care for [facility] and select a personal care service provider  . Return calls from [facility] to set up initial PCS assessment once application is approved . Call [facility] with questions [phone] or [phone]  Interventions provided by LCSW : . PCS referral faxed to [employer] at [phone]  . LCSW called [facility] to verify application is received and processed Patient Self Care  Activities & Deficits:  . Call provider office for new concerns or questions . Patient has review list of PCS providers and selected 3 providers from the list . Family/support system will assist patient with meeting needs until [facility] is approved Please see past updates related to this goal by clicking on the "Past Updates" button in the selected goal      Plan: LSCW will F/U with patient in 1 week  Casimer Lanius, [place] / [place]   [phone] 9:30 AM

## 2019-04-16 ENCOUNTER — Encounter: Payer: Self-pay | Admitting: Family Medicine

## 2019-04-16 DIAGNOSIS — E0810 Diabetes mellitus due to underlying condition with ketoacidosis without coma: Secondary | ICD-10-CM

## 2019-04-17 ENCOUNTER — Encounter: Payer: Self-pay | Admitting: Family Medicine

## 2019-04-17 ENCOUNTER — Other Ambulatory Visit: Payer: Self-pay

## 2019-04-17 ENCOUNTER — Telehealth (INDEPENDENT_AMBULATORY_CARE_PROVIDER_SITE_OTHER): Payer: Medicaid Other | Admitting: Family Medicine

## 2019-04-17 DIAGNOSIS — M25561 Pain in right knee: Secondary | ICD-10-CM

## 2019-04-17 DIAGNOSIS — M25562 Pain in left knee: Secondary | ICD-10-CM | POA: Diagnosis not present

## 2019-04-17 DIAGNOSIS — G8929 Other chronic pain: Secondary | ICD-10-CM | POA: Diagnosis not present

## 2019-04-17 DIAGNOSIS — M170 Bilateral primary osteoarthritis of knee: Secondary | ICD-10-CM

## 2019-04-17 MED ORDER — INSULIN GLARGINE 100 UNIT/ML SOLOSTAR PEN: 120.0000 [IU] | PEN_INJECTOR | SUBCUTANEOUS | 3 refills | Status: DC

## 2019-04-17 MED ORDER — OXYCODONE HCL 5 MG PO TABS: 5.0000 mg | ORAL_TABLET | Freq: Four times a day (QID) | ORAL | 0 refills | Status: DC | PRN

## 2019-04-17 NOTE — Progress Notes (Signed)
[id number] - [place], [place] - [address]  005#  t- no fever  bp- n/a

## 2019-04-17 NOTE — Telephone Encounter (Signed)
Has been taking 120 units daily for a few months. Sugar typically around 90-100.

## 2019-04-17 NOTE — Progress Notes (Signed)
[facility] Telemedicine Visit  Patient consented to have virtual visit. Method of visit: Telephone  Encounter participants: Patient: [name] - located at home Provider: [name] - located at [facility] Others (if applicable):   Chief Complaint: pain in knees   HPI: Chronic knee pain 2/2 severe osteoarthritis  Has been taking her pain medicine and takes lyrica depending on the week. Sometimes patient takes 2-3 x a week, but sometimes takes nothing. Her last prescription #30 was sent in December. She has not been doing PT, which she was referred to prior to her husband's passing. She is interested in referral to physical therapy. She has not contacted the bariatric clinic yet.   ROS: per HPI  Pertinent PMHx: Asthma, depression, diabetes, HTN, OSA, elevated BMI   Exam:  Respiratory: No respiratory distress. Speaking in full sentences.   Assessment/Plan:  Chronic pain of both knees #30 Oxycodone IR to pharmacy. Have discussed future plans in depth and slowly accomplishing goals with the help of CCM. Will continue to encourage patient to call bariatric clinic. Follow up for knee pain PRN.     Time spent during visit with patient: 7 minutes

## 2019-04-18 ENCOUNTER — Other Ambulatory Visit: Payer: Self-pay

## 2019-04-18 ENCOUNTER — Ambulatory Visit: Payer: Self-pay | Admitting: Licensed Clinical Social Worker

## 2019-04-18 ENCOUNTER — Telehealth: Payer: Medicaid Other

## 2019-04-18 NOTE — Chronic Care Management (AMB) (Signed)
  Social Work Care Management  Unsuccessful Phone Outreach   04/18/2019 Name: [name] MRN: [id number] DOB: Aug 16, 1965  Referred by: [name], MD,  Reason for referral : Care Coordination (F/U for PCS and counseling resources)   [name] is a 54 y.o. year old female who sees Selena Batten, Vinnie Langton, MD for primary care.   F/U called patient to assess ongoing needs and barriers reference the above referral. Telephone outreach was unsuccessful. Unable to leave a HIPPA compliant phone message due to the fact that the mailbox was full.  LCSW attempted two calls today.  Plan: LCSW will call again in 5 to 7 days  Sammuel Hines, LCSW Clinical Social Worker [facility]   [phone] 3:18 PM

## 2019-04-20 ENCOUNTER — Encounter: Payer: Self-pay | Admitting: Family Medicine

## 2019-04-20 NOTE — Assessment & Plan Note (Signed)
#  30 Oxycodone IR to pharmacy. Have discussed future plans in depth and slowly accomplishing goals with the help of CCM. Will continue to encourage patient to call bariatric clinic. Follow up for knee pain PRN.

## 2019-04-25 ENCOUNTER — Other Ambulatory Visit: Payer: Self-pay

## 2019-04-25 ENCOUNTER — Ambulatory Visit: Payer: Medicaid Other | Admitting: Licensed Clinical Social Worker

## 2019-04-25 DIAGNOSIS — F339 Major depressive disorder, recurrent, unspecified: Secondary | ICD-10-CM

## 2019-04-25 DIAGNOSIS — Z741 Need for assistance with personal care: Secondary | ICD-10-CM

## 2019-04-25 DIAGNOSIS — Z7189 Other specified counseling: Secondary | ICD-10-CM

## 2019-04-25 NOTE — Chronic Care Management (AMB) (Signed)
Care Management   Clinical Social Work Follow Up   04/25/2019 Name: [name] MRN: [id number] DOB: 03-11-1965  Referred by: Sara Oliphant, MD  Reason for referral : Care Coordination (PCS and counseling )  [name] is a 54 y.o. year old female who is a primary care patient of [name], Sara Quale, MD.  Reason for follow-up: phone encounter with patient today for ongoing assessment and brief interventions to assist with care coordination needs.   Assessment: Patient is making progress towards goal.  States she enjoys working with [employer] [name] calendar mailed to patient with Advance directive packet.   Review of patient status, including review of consultants reports, relevant laboratory and other test results, and collaboration with appropriate care team members and the patient's provider was performed as part of comprehensive patient evaluation and provision of care management services.   Advance Directive Status: N See Care Plan  for related entries. SDOH (Social Determinants of Health) assessments performed: No new needs identified  Goals Addressed            This Visit's Progress   . Advance Directives       CARE PLAN ENTRY (see longitudinal plan of care for additional care plan information) Current Barriers:  . Patient with HTN, DMII, and Depression does not have an Advance Directive . Acknowledges deficits, education and support in order to complete this document Clinical Social Work Goal(s):  Marland Kitchen Over the next 20 days, the patient will review mailed [name] education on Advance Directive as evidenced by patient self report of review . Over the next 30 days, with assistance of LCSW, the patient will complete mailed Advance Directive packet  . Over the next 45 days, the patient will  work with care management team on completion of Advance Directive, notarize and provide a copy to provider office Interventions provided by LCSW: . Mailed the patient an [name] educational handout on  Advance Directives as well as an Emergency planning/management officer . Advised patient to review information mailed by LCSW . A voluntary discussion about advanced care planning including explanation and discussion of advanced directives, healthcare proxy and living will was discussed with the patient.  Patient Self Care Activities:  . Is able to complete documentation independently . Able to identify next of kin or [name] of Attorney/Health Care Agent Initial goal documentation    . Connect with counselor   On track    [name] (see longtitudinal plan of care for additional care plan information) Current Barriers & Progress:  . Patient with HTN, DMII, and Depression acknowledges deficits with connecting to mental health provider for ongoing counseling   . Patient is experiencing symptoms of depression which seem to be exacerbated by chronic medical conditions and grief.     . Patient needs Support, Education, and Care Coordination in order to meet unmet mental health needs  . Patient has completed and signed all paper work with [name] she is now waiting for a start date for therapy . Patient has implemented relaxation techniques received from [name]):  Marland Kitchen Over the next 30 days, patient will work with SW bi-weekly by telephone to reduce or manage symptoms of stress until connected for ongoing counseling resources.  Interventions:  . Assessed patient's barriers and care coordination needs  . Discussed several options for long term counseling based on need and insurance.  . Assisted patient with contacting [name] [phone]) intake completed today by [name]. . Other  interventions include: Solution-Focused Strategies and relaxed breathing . E-mail [name] Education video on Breathing to Relax and Relieving Stress Patient Self Care Activities & Deficits:  . Patient is unable to independently navigate community resource options without care  coordination support . Patient is able to implement clinical interventions discussed today and is motivated for treatment  . Patient will wait for [name] [phone]) to contact her to complete the assessment and will F/U if they have not called. Please see past updates related to this goal by clicking on the "Past Updates" button in the selected goal      . [name]   On track    [name] (see longtitudinal plan of care for additional care plan information) Current Barriers & Progress:   . Patient with HTN, DMII, and Depression needs Support, Education, and Care Coordination to resolve unmet Personal Care needs . ADL IADL limitations . PCS referral signed by PCP and faxed to [name] . [name] have made attempts to contact patient to schedule assessment . Patient has completed her PCS assessment and selected an agency. She is waiting on a start date Clinical Social Work Goal(s):  Marland Kitchen Over the next 30 to 45 days, patient will have personal care needs met as evident by having PCS Aide in the home assisting with needs. . Over the next 30 days patient will work with LCSW, and [name] to coordinate care for [name] and select a personal care service provider  Interventions provided by LCSW : . Assessed needs and barriers with PCS . PCS referral faxed to [name] at [phone]  . LCSW called [name] to verify application is received and processed Patient Self Care Activities & Deficits:  . Call provider office for new concerns or questions . Family/support system will assist patient with meeting needs until [name] is approved . Return calls from [name] to set up initial PCS assessment once application is approved . Call [name] with questions [phone] or [phone]  Please see past updates related to this goal by clicking on the "Past Updates" button in the selected goal         Outpatient Encounter Medications as of 04/25/2019  Medication Sig  . ACCU-CHEK AVIVA PLUS test strip USE AS INSTRUCTED  . acetaminophen (TYLENOL) 500 MG tablet Take 500 mg by mouth every 6 (six) hours as needed.  Marland Kitchen albuterol (PROAIR HFA) 108 (90 Base) MCG/ACT inhaler Inhale 2 puffs into the lungs every 4 (four) hours as needed.  Marland Kitchen amLODipine (NORVASC) 5 MG tablet Take 1 tablet (5 mg total) by mouth at bedtime.  . Blood Glucose Monitoring Suppl (ACCU-CHEK AVIVA PLUS) w/Device KIT 1 each by Does not apply route 3 (three) times daily after meals.  . budesonide-formoterol (SYMBICORT) 160-4.5 MCG/ACT inhaler Inhale 2 puffs into the lungs 2 (two) times daily.  Marland Kitchen buPROPion (WELLBUTRIN XL) 300 MG 24 hr tablet Take 1 tablet (300 mg total) by mouth daily.  . cetirizine (ZYRTEC) 10 MG tablet Take 1 tablet (10 mg total) by mouth daily.  . cloNIDine (CATAPRES) 0.1 MG tablet PLEASE SEE ATTACHED FOR DETAILED DIRECTIONS  . cloNIDine (CATAPRES-TTS-3) 0.3 mg/24hr patch Place 1 patch (0.3 mg total) onto the skin once a week.  Marland Kitchen FLUoxetine (PROZAC) 20 MG tablet Take 1 tablet (20 mg total) by mouth daily for 7 days, THEN 1 tablet (20 mg total) every other day for 7 days.  . furosemide (LASIX) 20 MG  tablet TAKE 1 TABLET (20 MG TOTAL) BY MOUTH DAILY AS NEEDED.  . hydrochlorothiazide (HYDRODIURIL) 25 MG tablet Take 1 tablet (25 mg total) by mouth daily.  . insulin glargine (LANTUS) 100 UNIT/ML Solostar Pen Inject 120 Units into the skin every morning. Increase 2 units daily if CBG > 100.  . Insulin Pen Needle (PEN NEEDLES 3/16") 31G X 5 MM MISC Use with Lantus solostar  . Lancets (ACCU-CHEK SOFT TOUCH) lancets Use as instructed  . montelukast (SINGULAIR) 10 MG tablet TAKE 1 TABLET BY MOUTH EVERYDAY AT BEDTIME  . omeprazole (PRILOSEC) 40 MG capsule Take 1 capsule (40 mg total) by mouth daily.  Marland Kitchen oxybutynin (DITROPAN) 5 MG tablet TAKE 1 TABLET BY MOUTH TWICE A DAY  . oxyCODONE (OXY IR/ROXICODONE) 5 MG immediate  release tablet Take 1 tablet (5 mg total) by mouth every 6 (six) hours as needed for severe pain.  . polyethylene glycol (MIRALAX / GLYCOLAX) packet Take 17 g by mouth daily as needed for mild constipation.  . pregabalin (LYRICA) 50 MG capsule Take 1 capsule (50 mg total) by mouth 2 (two) times daily.  . VOLTAREN 1 % GEL APPLY 2 G TOPICALLY 4 (FOUR) TIMES DAILY AS NEEDED (KNEE PAIN). (Patient not taking: Reported on 09/14/2018)   .  Plan: LCSW will F/U with patient in 3 weeks  Casimer Lanius, [place] / [place]   [phone] 3:07 PM

## 2019-04-25 NOTE — Patient Instructions (Signed)
Licensed Clinical Social Worker Visit Information Ms. Limburg  it was nice speaking with you. Please call me directly if you have questions [phone] Goals we discussed today:  Goals Addressed            This Visit's Progress   . Advance Directives       CARE PLAN ENTRY (see longitudinal plan of care for additional care plan information) Current Barriers:  . Patient with HTN, DMII, and Depression does not have an Advance Directive . Acknowledges deficits, education and support in order to complete this document Clinical Social Work Goal(s):  Marland Kitchen Over the next 20 days, the patient will review mailed EMMI education on Advance Directive as evidenced by patient self report of review . Over the next 30 days, with assistance of LCSW, the patient will complete mailed Advance Directive packet  . Over the next 45 days, the patient will  work with care management team on completion of Advance Directive, notarize and provide a copy to provider office Interventions provided by LCSW: . Mailed the patient an EMMI educational handout on Advance Directives as well as an Emergency planning/management officer . Advised patient to review information mailed by LCSW . A voluntary discussion about advanced care planning including explanation and discussion of advanced directives, healthcare proxy and living will was discussed with the patient.  Patient Self Care Activities:  . Is able to complete documentation independently . Able to identify next of kin or [place] of Attorney/Health Care Agent Initial goal documentation    . Connect with counselor   On track    [place] (see longtitudinal plan of care for additional care plan information) Current Barriers & Progress:  . Patient with HTN, DMII, and Depression acknowledges deficits with connecting to mental health provider for ongoing counseling   . Patient is experiencing symptoms of depression which seem to be exacerbated by chronic medical  conditions and grief.     . Patient needs Support, Education, and Care Coordination in order to meet unmet mental health needs  . Patient has completed and signed all paper work with [employer] she is now waiting for a start date for therapy . Patient has implemented relaxation techniques received from [place]):  Marland Kitchen Over the next 30 days, patient will work with SW bi-weekly by telephone to reduce or manage symptoms of stress until connected for ongoing counseling resources.  Interventions:  . Assessed patient's barriers and care coordination needs  . Discussed several options for long term counseling based on need and insurance.  . Assisted patient with contacting [facility] [phone]) intake completed today by Caryl Pina. . Other interventions include: Solution-Focused Strategies and relaxed breathing . E-mail EMMI Education video on Breathing to Relax and Relieving Stress Patient Self Care Activities & Deficits:  . Patient is unable to independently navigate community resource options without care coordination support . Patient is able to implement clinical interventions discussed today and is motivated for treatment  . Patient will wait for [facility] [phone]) to contact her to complete the assessment and will F/U if they have not called. Please see past updates related to this goal by clicking on the "Past Updates" button in the selected goal      . [place]   On track    [place] (see longtitudinal plan of care for additional care plan information) Current Barriers & Progress:   . Patient with HTN, DMII, and Depression needs Support, Education,  and Care Coordination to resolve unmet Personal Care needs . ADL IADL limitations . PCS referral signed by PCP and faxed to [facility] . [facility] have made attempts to contact patient to schedule assessment . Patient has completed her PCS assessment and selected  an agency. She is waiting on a start date Clinical Social Work Goal(s):  Marland Kitchen Over the next 30 to 45 days, patient will have personal care needs met as evident by having PCS Aide in the home assisting with needs. . Over the next 30 days patient will work with LCSW, and [facility] to coordinate care for [facility] and select a personal care service provider  Interventions provided by LCSW : . Assessed needs and barriers with PCS . PCS referral faxed to [employer] at [phone]  . LCSW called [facility] to verify application is received and processed Patient Self Care Activities & Deficits:  . Call provider office for new concerns or questions . Family/support system will assist patient with meeting needs until [facility] is approved . Return calls from [facility] to set up initial PCS assessment once application is approved . Call [facility] with questions [phone] or [phone]  Please see past updates related to this goal by clicking on the "Past Updates" button in the selected goal       Materials provided: Yes: advance directives mailed Ms. Socarras received Care Management services today:  1. Care Management services include personalized support from designated clinical staff supervised by her physician, including individualized plan of care and coordination with other care providers 2. 24/7 contact [phone] for assistance for urgent and routine care needs. 3. Care Management are voluntary services and be declined at any time by calling the office. Patient  verbally agreed to assistance and services provided by embedded care coordination/care management team today.   Patient verbalizes understanding of instructions provided today.  Follow up plan:  SW will follow up with patient by phone over the next 3 weeks  Maurine Cane, LCSW

## 2019-05-16 ENCOUNTER — Other Ambulatory Visit: Payer: Self-pay

## 2019-05-16 ENCOUNTER — Ambulatory Visit: Payer: Self-pay | Admitting: Licensed Clinical Social Worker

## 2019-05-16 ENCOUNTER — Telehealth: Payer: Medicaid Other

## 2019-05-16 NOTE — Chronic Care Management (AMB) (Signed)
  Social Work Care Management  Unsuccessful Phone Outreach   05/16/2019 Name: [name] MRN: [id number] DOB: July 26, 1965  Referred by: [name], MD,  Reason for referral : Care Coordination (F/U)   [name] is a 54 y.o. year old female who sees Selena Batten, Vinnie Langton, MD for primary care.  LCSW is working with patient on several care goals.  Called patient to assess needs and barriers for ongoing care coordination needs. Telephone outreach was unsuccessful. A HIPPA compliant phone message was left for the patient providing contact information and requesting a return call.  Plan:  If no return call is received. LCSW will call again in 5 to 7 days.  Sammuel Hines, LCSW Chronic Care Coordination  [facility] / [facility]   [phone] 3:10 PM

## 2019-05-25 ENCOUNTER — Ambulatory Visit: Payer: Medicaid Other | Admitting: Licensed Clinical Social Worker

## 2019-05-25 ENCOUNTER — Other Ambulatory Visit: Payer: Self-pay

## 2019-05-25 ENCOUNTER — Ambulatory Visit: Payer: Self-pay | Admitting: Licensed Clinical Social Worker

## 2019-05-25 DIAGNOSIS — Z741 Need for assistance with personal care: Secondary | ICD-10-CM

## 2019-05-25 DIAGNOSIS — Z7189 Other specified counseling: Secondary | ICD-10-CM

## 2019-05-25 NOTE — Chronic Care Management (AMB) (Signed)
Care Management   Clinical Social Work Follow Up   05/25/2019 Name: [name] MRN: [id number] DOB: 06-27-1965  Referred by: Wilber Oliphant, MD  Reason for referral : Care Coordination (PCS & counseling )  [name] is a 54 y.o. year old female who is a primary care patient of [name], [name], MD.  Reason for follow-up: Phone encounter with patient today for ongoing assessment and brief interventions to assist with care coordination needs.   Assessment: Patient is making progress towards goals.  PCS goal completed.  Continues to experience difficulty with connecting with therapy.  Review of patient status, including review of consultants reports, relevant laboratory and other test results, and collaboration with appropriate care team members and the patient's provider was performed as part of comprehensive patient evaluation and provision of care management services.    Advance Directive Status: N See Care Plan  for related entries. SDOH (Social Determinants of Health) assessments performed: Yes   Goals Addressed            This Visit's Progress   . Advance Directives   On track    [place] (see longitudinal plan of care for additional care plan information) Current Barriers & Progress:  . Patient with HTN, DMII, and Depression does not have an Advance Directive . Acknowledges deficits, education and support in order to complete this document . Patient received and review Advance Directive information as well EMMI education video Clinical Social Work Goal(s):  Marland Kitchen Over the next 45 days, the patient will  work with care management team on completion of Advance Directive, notarize and provide a copy to provider office Interventions provided by LCSW: .  Discussed educational handout on Advance Directives as well as an Forensic scientist packet . Advised patient to select two people she would like as her health care agents . LCSW continued a voluntary discussion about  advanced care planning (advanced directives, healthcare proxy and living will) with the patient.  Patient Self Care Activities:  . Is able to complete documentation independently . Able to identify [place] of Attorney/Health Care Agent Please see past updates related to this goal by clicking on the "Past Updates" button in the selected goal     . Connect with counselor   Not on track    [place] (see longtitudinal plan of care for additional care plan information) Current Barriers & Progress:  . Patient with HTN, DMII, and Depression acknowledges deficits with connecting to mental health provider for ongoing counseling   . Patient is experiencing symptoms of depression which seem to be exacerbated by chronic medical conditions and grief.     . Patient needs Support, Education, and Care Coordination in order to meet unmet mental health needs  . Patient has completed and signed all paper work with [employer] she is now waiting for a start date for therapy . Patient has implemented relaxation techniques received from LCSW . As of today [facility] has not contacted patient for therapy  Clinical Social Work Goal(s):  Marland Kitchen Over the next 30 days, patient will work with SW bi-weekly by telephone to reduce or manage symptoms of stress until connected for ongoing counseling resources.  Interventions:  . Assessed patient's barriers and care coordination needs  . [facility] [phone] with patient on line to assess barriers, ( waiting for clinic director to return call) . Other interventions include: Solution-Focused Strategies  Patient Self Care Activities & Deficits:  . Patient is  unable to independently navigate community resource options without care coordination support . Patient will wait for [employer] [phone]) to contact her to complete the assessment  Please see past updates related to this goal by clicking on the "Past Updates" button in the  selected goal      . COMPLETED: [place] (see longtitudinal plan of care for additional care plan information) Current Barriers & Progress:   . Patient with HTN, DMII, and Depression needs Support, Education, and Care Coordination to resolve unmet Personal Care needs . ADL IADL limitations . PCS referral signed by PCP and faxed to [facility] . [facility] have made attempts to contact patient to schedule assessment . PCS services have started, no concerns at this time Clinical Social Work Goal(s):  Marland Kitchen Over the next 30 to 45 days, patient will have personal care needs met as evident by having PCS Aide in the home assisting with needs. Interventions provided by LCSW : . Assessed needs and progress with PCS Patient Self Care Activities & Deficits:  . Call provider office for new concerns or questions . Call [facility] with questions [phone] or [phone] or PCS agency Please see past updates related to this goal by clicking on the "Past Updates" button in the selected goal                  Outpatient Encounter Medications as of 05/25/2019  Medication Sig  . ACCU-CHEK AVIVA PLUS test strip USE AS INSTRUCTED  . acetaminophen (TYLENOL) 500 MG tablet Take 500 mg by mouth every 6 (six) hours as needed.  Marland Kitchen albuterol (PROAIR HFA) 108 (90 Base) MCG/ACT inhaler Inhale 2 puffs into the lungs every 4 (four) hours as needed.  Marland Kitchen amLODipine (NORVASC) 5 MG tablet Take 1 tablet (5 mg total) by mouth at bedtime.  . Blood Glucose Monitoring Suppl (ACCU-CHEK AVIVA PLUS) w/Device KIT 1 each by Does not apply route 3 (three) times daily after meals.  . budesonide-formoterol (SYMBICORT) 160-4.5 MCG/ACT inhaler Inhale 2 puffs into the lungs 2 (two) times daily.  Marland Kitchen buPROPion (WELLBUTRIN XL) 300 MG 24 hr tablet Take 1 tablet (300 mg total) by mouth daily.  . cetirizine (ZYRTEC) 10 MG tablet Take 1 tablet (10 mg total) by mouth daily.  . cloNIDine  (CATAPRES) 0.1 MG tablet PLEASE SEE ATTACHED FOR DETAILED DIRECTIONS  . cloNIDine (CATAPRES-TTS-3) 0.3 mg/24hr patch Place 1 patch (0.3 mg total) onto the skin once a week.  Marland Kitchen FLUoxetine (PROZAC) 20 MG tablet Take 1 tablet (20 mg total) by mouth daily for 7 days, THEN 1 tablet (20 mg total) every other day for 7 days.  . furosemide (LASIX) 20 MG tablet TAKE 1 TABLET (20 MG TOTAL) BY MOUTH DAILY AS NEEDED.  . hydrochlorothiazide (HYDRODIURIL) 25 MG tablet Take 1 tablet (25 mg total) by mouth daily.  . insulin glargine (LANTUS) 100 UNIT/ML Solostar Pen Inject 120 Units into the skin every morning. Increase 2 units daily if CBG > 100.  . Insulin Pen Needle (PEN NEEDLES 3/16") 31G X 5 MM MISC Use with Lantus solostar  . Lancets (ACCU-CHEK SOFT TOUCH) lancets Use as instructed  . montelukast (SINGULAIR) 10 MG tablet TAKE 1 TABLET BY MOUTH EVERYDAY AT BEDTIME  . omeprazole (PRILOSEC) 40 MG capsule Take 1 capsule (40 mg total) by mouth daily.  Marland Kitchen oxybutynin (DITROPAN) 5 MG tablet TAKE 1 TABLET BY MOUTH TWICE A DAY  . oxyCODONE (OXY  IR/ROXICODONE) 5 MG immediate release tablet Take 1 tablet (5 mg total) by mouth every 6 (six) hours as needed for severe pain.  . polyethylene glycol (MIRALAX / GLYCOLAX) packet Take 17 g by mouth daily as needed for mild constipation.  . pregabalin (LYRICA) 50 MG capsule Take 1 capsule (50 mg total) by mouth 2 (two) times daily.  . VOLTAREN 1 % GEL APPLY 2 G TOPICALLY 4 (FOUR) TIMES DAILY AS NEEDED (KNEE PAIN). (Patient not taking: Reported on 09/14/2018)   No facility-administered encounter medications on file as of 05/25/2019.   Plan: LCSW waiting for return call from [place] Will F/U with patient in 2 weeks or sooner if needed  Casimer Lanius, [place] / [place]   [phone] 11:55 AM

## 2019-05-25 NOTE — Patient Instructions (Signed)
Licensed Clinical Social Worker Visit Information Ms. Weekley  it was nice speaking with you. Please call me directly if you have questions [phone] Goals we discussed today:  Advance directives, ongoing counseling and PCS services Goals Addressed            This Visit's Progress   . Advance Directives   On track    [place] (see longitudinal plan of care for additional care plan information) Current Barriers & Progress:  . Patient with HTN, DMII, and Depression does not have an Advance Directive . Acknowledges deficits, education and support in order to complete this document . Patient received and review Advance Directive information as well EMMI education video Clinical Social Work Goal(s):  Marland Kitchen Over the next 45 days, the patient will  work with care management team on completion of Advance Directive, notarize and provide a copy to provider office Interventions provided by LCSW: .  Discussed educational handout on Advance Directives as well as an Forensic scientist packet . Advised patient to select two people she would like as her health care agents . LCSW continued a voluntary discussion about advanced care planning (advanced directives, healthcare proxy and living will) with the patient.  Patient Self Care Activities:  . Is able to complete documentation independently . Able to identify [place] of Attorney/Health Care Agent Please see past updates related to this goal by clicking on the "Past Updates" button in the selected goal     . Connect with counselor   Not on track    [place] (see longtitudinal plan of care for additional care plan information) Current Barriers & Progress:  . Patient with HTN, DMII, and Depression acknowledges deficits with connecting to mental health provider for ongoing counseling   . Patient is experiencing symptoms of depression which seem to be exacerbated by chronic medical conditions and grief.     . Patient needs  Support, Education, and Care Coordination in order to meet unmet mental health needs  . Patient has completed and signed all paper work with [employer] she is now waiting for a start date for therapy . Patient has implemented relaxation techniques received from LCSW . As of today [facility] has not contacted patient for therapy  Clinical Social Work Goal(s):  Marland Kitchen Over the next 30 days, patient will work with SW bi-weekly by telephone to reduce or manage symptoms of stress until connected for ongoing counseling resources.  Interventions:  . Assessed patient's barriers and care coordination needs  . [facility] [phone] with patient on line to assess barriers, ( waiting for clinic director to return call) . Other interventions include: Solution-Focused Strategies  Patient Self Care Activities & Deficits:  . Patient is unable to independently navigate community resource options without care coordination support . Patient will wait for [employer] [employer] [phone]) to contact her to complete the assessment  Please see past updates related to this goal by clicking on the "Past Updates" button in the selected goal      . COMPLETED: [place] (see longtitudinal plan of care for additional care plan information) Current Barriers & Progress:   . Patient with HTN, DMII, and Depression needs Support, Education, and Care Coordination to resolve unmet Personal Care needs . ADL IADL limitations . PCS referral signed by PCP and faxed to [facility] . [facility] have made attempts to contact patient to schedule assessment . PCS services have  started, no concerns at this time Clinical Social Work Goal(s):  Marland Kitchen Over the next 30 to 45 days, patient will have personal care needs met as evident by having PCS Aide in the home assisting with needs. Interventions provided by LCSW : . Assessed needs and progress with PCS Patient Self Care Activities  & Deficits:  . Call provider office for new concerns or questions . Call [facility] with questions [phone] or [phone] or PCS agency Please see past updates related to this goal by clicking on the "Past Updates" button in the selected goal      Materials provided: Verbal education about advance directives provided by phone Ms. [place] received Care Management services today:  1. Care Management services include personalized support from designated clinical staff supervised by her physician, including individualized plan of care and coordination with other care providers 2. 24/7 contact [phone] for assistance for urgent and routine care needs. 3. Care Management are voluntary services and be declined at any time by calling the office.  Patient  verbally agreed to assistance and services provided by embedded care coordination/care management team today.   Patient verbalizes understanding of instructions provided today.  Follow up plan:  SW will follow up with patient by phone over the next two weeks  Maurine Cane, LCSW

## 2019-05-25 NOTE — Chronic Care Management (AMB) (Signed)
   Social Work  Care Management Collaboration 05/25/2019 Name: [name] MRN: [id number] DOB: 05/28/1965  Return call from Al with [facility].  Patient's therapist is Crystal.  She will contact patient today to complete her therapeutic assessment.    Plan: LSCW will F/U with patient in 2 weeks  Sammuel Hines, LCSW Chronic Care Coordination  [facility] / [facility]   [phone] 1:46 PM

## 2019-06-01 ENCOUNTER — Encounter: Payer: Self-pay | Admitting: Family Medicine

## 2019-06-08 ENCOUNTER — Other Ambulatory Visit: Payer: Self-pay

## 2019-06-08 ENCOUNTER — Ambulatory Visit: Payer: Medicaid Other | Admitting: Licensed Clinical Social Worker

## 2019-06-08 DIAGNOSIS — Z7189 Other specified counseling: Secondary | ICD-10-CM

## 2019-06-08 NOTE — Patient Instructions (Signed)
Licensed Clinical Social Worker Visit Information Ms. Henry  it was nice speaking with you. Please call me directly if you have questions [phone] Goals we discussed today: completing advance directives Goals Addressed            This Visit's Progress   . Advance Directives   On track    [place] (see longitudinal plan of care for additional care plan information) Current Barriers & Progress:  . Patient with HTN, DMII, and Depression does not have an Advance Directive . Acknowledges deficits, education and support in order to complete this document . Patient received and review Advance Directive information as well EMMI education video . Patient does not have contact information for family she has selected for health care agent Clinical Social Work Goal(s):  Marland Kitchen Over the next 45 days, the patient will  work with care management team on completion of Advance Directive, notarize and provide a copy to provider office Interventions provided by LCSW: .  Discussed educational handout on Advance Directives as well as an Forensic scientist packet . Advised patient to select two people she would like as her health care agents . LCSW continued a voluntary discussion about advanced care planning (advanced directives, healthcare proxy and living will) with the patient.  Patient Self Care Activities:  . Is able to complete documentation independently . Able to identify [place] . Patient will get address for health care agent  Please see past updates related to this goal by clicking on the "Past Updates" button in the selected goal     . COMPLETED: Connect with counselor       [place] (see longtitudinal plan of care for additional care plan information) Current Barriers & Progress:  . Patient with HTN, DMII, and Depression acknowledges deficits with connecting to mental health provider for ongoing counseling   . Patient is experiencing  symptoms of depression which seem to be exacerbated by chronic medical conditions and grief.     . Patient needs Support, Education, and Care Coordination in order to meet unmet mental health needs  . Patient has completed and signed all paper work with [employer] she is now waiting for a start date for therapy . Patient has implemented relaxation techniques received from LCSW . Patient has first counseling session with counselor Crystal from [facility] Goal(s):  Marland Kitchen Over the next 30 days, patient will work with SW bi-weekly by telephone to reduce or manage symptoms of stress until connected for ongoing counseling resources.  Interventions:  . Assessed patient's barriers and care coordination needs  . Other interventions include: Solution-Focused Strategies  Patient Self Care Activities & Deficits:  . Patient is unable to independently navigate community resource options without care coordination support . Patient will call [facility] [phone]) if needed Please see past updates related to this goal by clicking on the "Past Updates" button in the selected goal       Materials provided:  Ms. Doi received Care Management services today:  1. Care Management services include personalized support from designated clinical staff supervised by her physician, including individualized plan of care and coordination with other care providers 2. 24/7 contact [phone] for assistance for urgent and routine care needs. 3. Care Management are voluntary services and be declined at any time by calling the office.  Patient verbalizes understanding of instructions provided today.  Follow up plan:  SW will follow up with patient by phone over  the next week  Soundra Pilon, LCSW

## 2019-06-08 NOTE — Chronic Care Management (AMB) (Signed)
Care Management   Clinical Social Work Follow Up   06/08/2019 Name: [name] MRN: [id number] DOB: [birth year]  Referred by: Wilber Oliphant, MD  Reason for referral : Care Coordination (F/U counseling resources)  [name] is a 54 y.o. year old female who is a primary care patient of [name], [name], MD.  Reason for follow-up: Phone encounter with patient today for ongoing assessment and brief interventions to connect for ongoing counseling and education for advance directives, Assessment: Patient ia making progress towards goal. States she had her first counseling session today in her home and things went well. She continues to work on the advance directive.   Review of patient status, including review of consultants reports, relevant laboratory and other test results, and collaboration with appropriate care team members and the patient's provider was performed as part of comprehensive patient evaluation and provision of care management services.    Advance Directive Status: N See Care Plan  for related entries. SDOH (Social Determinants of Health) assessments performed: No needs identified.    Goals Addressed            This Visit's Progress   . Advance Directives   On track    [place] (see longitudinal plan of care for additional care plan information) Current Barriers & Progress:  . Patient with HTN, DMII, and Depression does not have an Advance Directive . Acknowledges deficits, education and support in order to complete this document . Patient received and review Advance Directive information as well EMMI education video . Patient does not have contact information for family she has selected for health care agent Clinical Social Work Goal(s):  Marland Kitchen Over the next 45 days, the patient will  work with care management team on completion of Advance Directive, notarize and provide a copy to provider office Interventions provided by LCSW: .  Discussed educational  handout on Advance Directives as well as an Forensic scientist packet . Advised patient to select two people she would like as her health care agents . LCSW continued a voluntary discussion about advanced care planning (advanced directives, healthcare proxy and living will) with the patient.  Patient Self Care Activities:  . Is able to complete documentation independently . Able to identify [place] . Patient will get address for health care agent  Please see past updates related to this goal by clicking on the "Past Updates" button in the selected goal     . COMPLETED: Connect with counselor       [name] (see longtitudinal plan of care for additional care plan information) Current Barriers & Progress:  . Patient with HTN, DMII, and Depression acknowledges deficits with connecting to mental health provider for ongoing counseling   . Patient is experiencing symptoms of depression which seem to be exacerbated by chronic medical conditions and grief.     . Patient needs Support, Education, and Care Coordination in order to meet unmet mental health needs  . Patient has completed and signed all paper work with [employer] she is now waiting for a start date for therapy . Patient has implemented relaxation techniques received from LCSW . Patient has first counseling session with counselor Crystal from [facility] Goal(s):  Marland Kitchen Over the next 30 days, patient will work with SW bi-weekly by telephone to reduce or manage symptoms of stress until connected for ongoing counseling resources.  Interventions:  . Assessed patient's barriers and care  coordination needs  . Other interventions include: Solution-Focused Strategies  Patient Self Care Activities & Deficits:  . Patient is unable to independently navigate community resource options without care coordination support . Patient will call [facility] [phone]) if  needed Please see past updates related to this goal by clicking on the "Past Updates" button in the selected goal        Outpatient Encounter Medications as of 06/08/2019  Medication Sig  . ACCU-CHEK AVIVA PLUS test strip USE AS INSTRUCTED  . acetaminophen (TYLENOL) 500 MG tablet Take 500 mg by mouth every 6 (six) hours as needed.  Marland Kitchen albuterol (PROAIR HFA) 108 (90 Base) MCG/ACT inhaler Inhale 2 puffs into the lungs every 4 (four) hours as needed.  Marland Kitchen amLODipine (NORVASC) 5 MG tablet Take 1 tablet (5 mg total) by mouth at bedtime.  . Blood Glucose Monitoring Suppl (ACCU-CHEK AVIVA PLUS) w/Device KIT 1 each by Does not apply route 3 (three) times daily after meals.  . budesonide-formoterol (SYMBICORT) 160-4.5 MCG/ACT inhaler Inhale 2 puffs into the lungs 2 (two) times daily.  Marland Kitchen buPROPion (WELLBUTRIN XL) 300 MG 24 hr tablet Take 1 tablet (300 mg total) by mouth daily.  . cetirizine (ZYRTEC) 10 MG tablet Take 1 tablet (10 mg total) by mouth daily.  . cloNIDine (CATAPRES) 0.1 MG tablet PLEASE SEE ATTACHED FOR DETAILED DIRECTIONS  . cloNIDine (CATAPRES-TTS-3) 0.3 mg/24hr patch Place 1 patch (0.3 mg total) onto the skin once a week.  Marland Kitchen FLUoxetine (PROZAC) 20 MG tablet Take 1 tablet (20 mg total) by mouth daily for 7 days, THEN 1 tablet (20 mg total) every other day for 7 days.  . furosemide (LASIX) 20 MG tablet TAKE 1 TABLET (20 MG TOTAL) BY MOUTH DAILY AS NEEDED.  . hydrochlorothiazide (HYDRODIURIL) 25 MG tablet Take 1 tablet (25 mg total) by mouth daily.  . insulin glargine (LANTUS) 100 UNIT/ML Solostar Pen Inject 120 Units into the skin every morning. Increase 2 units daily if CBG > 100.  . Insulin Pen Needle (PEN NEEDLES 3/16") 31G X 5 MM MISC Use with Lantus solostar  . Lancets (ACCU-CHEK SOFT TOUCH) lancets Use as instructed  . montelukast (SINGULAIR) 10 MG tablet TAKE 1 TABLET BY MOUTH EVERYDAY AT BEDTIME  . omeprazole (PRILOSEC) 40 MG capsule Take 1 capsule (40 mg total) by mouth daily.  Marland Kitchen  oxybutynin (DITROPAN) 5 MG tablet TAKE 1 TABLET BY MOUTH TWICE A DAY  . oxyCODONE (OXY IR/ROXICODONE) 5 MG immediate release tablet Take 1 tablet (5 mg total) by mouth every 6 (six) hours as needed for severe pain.  . polyethylene glycol (MIRALAX / GLYCOLAX) packet Take 17 g by mouth daily as needed for mild constipation.  . pregabalin (LYRICA) 50 MG capsule Take 1 capsule (50 mg total) by mouth 2 (two) times daily.  . VOLTAREN 1 % GEL APPLY 2 G TOPICALLY 4 (FOUR) TIMES DAILY AS NEEDED (KNEE PAIN). (Patient not taking: Reported on 09/14/2018)   No facility-administered encounter medications on file as of 06/08/2019.   Plan: Phone appointment scheduled with patient in one week to assist with advance directives  Casimer Lanius, [place] / [place]   [phone] 2:28 PM

## 2019-06-15 ENCOUNTER — Other Ambulatory Visit: Payer: Self-pay

## 2019-06-15 ENCOUNTER — Ambulatory Visit: Payer: Medicaid Other | Admitting: Licensed Clinical Social Worker

## 2019-06-15 DIAGNOSIS — Z7189 Other specified counseling: Secondary | ICD-10-CM

## 2019-06-15 NOTE — Chronic Care Management (AMB) (Signed)
Care Management   Clinical Social Work Follow Up   06/15/2019 Name: [name] MRN: [id number] DOB: [birth year]  Referred by: Wilber Oliphant, MD  Reason for referral : Care Coordination (advance directive) [name] is a 54 y.o. year old female who is a primary care patient of [name], [name], MD.   Reason for follow-up: Phone encounter with patient today to provide advance directive education and assist with process.  Assessment: Patient is making progress towards all of her goals.  She has [place] in place, started counseling and completed advance directives today.  No new goals established.     Review of patient status, including review of consultants reports, relevant laboratory and other test results, and collaboration with appropriate care team members and the patient's provider was performed as part of comprehensive patient evaluation and provision of care management services.    Advance Directive Status: N See Care Plan for related entries. SDOH (Social Determinants of Health) assessments performed: No needs identified.   Goals Addressed            This Visit's Progress   . Advance Directives   On track    [place] (see longitudinal plan of care for additional care plan information) Current Barriers & Progress:  . Patient with HTN, DMII, and Depression does not have an Advance Directive . Acknowledges deficits, education and support in order to complete this document . Patient received and review Advance Directive information as well EMMI education video . Patient has selected for health care agent Clinical Social Work Goal(s):  Marland Kitchen Over the next 45 days, the patient will  work with care management team on completion of Advance Directive, notarize and provide a copy to provider office Interventions provided by LCSW: .  Discussed educational handout on Advance Directives as well as an Forensic scientist packet . voluntary discussion about advanced  care planning (advanced directives, healthcare proxy and living will) as well as assisted patient with completing paperwork  Patient Self Care Activities:  . Is able to complete documentation independently . Able to identify [place] . Patient completed document will have notarized and bring a copy to the office Please see past updates related to this goal by clicking on the "Past Updates" button in the selected goal        Outpatient Encounter Medications as of 06/15/2019  Medication Sig  . ACCU-CHEK AVIVA PLUS test strip USE AS INSTRUCTED  . acetaminophen (TYLENOL) 500 MG tablet Take 500 mg by mouth every 6 (six) hours as needed.  Marland Kitchen albuterol (PROAIR HFA) 108 (90 Base) MCG/ACT inhaler Inhale 2 puffs into the lungs every 4 (four) hours as needed.  Marland Kitchen amLODipine (NORVASC) 5 MG tablet Take 1 tablet (5 mg total) by mouth at bedtime.  . Blood Glucose Monitoring Suppl (ACCU-CHEK AVIVA PLUS) w/Device KIT 1 each by Does not apply route 3 (three) times daily after meals.  . budesonide-formoterol (SYMBICORT) 160-4.5 MCG/ACT inhaler Inhale 2 puffs into the lungs 2 (two) times daily.  Marland Kitchen buPROPion (WELLBUTRIN XL) 300 MG 24 hr tablet Take 1 tablet (300 mg total) by mouth daily.  . cetirizine (ZYRTEC) 10 MG tablet Take 1 tablet (10 mg total) by mouth daily.  . cloNIDine (CATAPRES) 0.1 MG tablet PLEASE SEE ATTACHED FOR DETAILED DIRECTIONS  . cloNIDine (CATAPRES-TTS-3) 0.3 mg/24hr patch Place 1 patch (0.3 mg total) onto the skin once a week.  Marland Kitchen FLUoxetine (PROZAC) 20 MG tablet Take 1  tablet (20 mg total) by mouth daily for 7 days, THEN 1 tablet (20 mg total) every other day for 7 days.  . furosemide (LASIX) 20 MG tablet TAKE 1 TABLET (20 MG TOTAL) BY MOUTH DAILY AS NEEDED.  . hydrochlorothiazide (HYDRODIURIL) 25 MG tablet Take 1 tablet (25 mg total) by mouth daily.  . insulin glargine (LANTUS) 100 UNIT/ML Solostar Pen Inject 120 Units into the skin every morning.  Increase 2 units daily if CBG > 100.  . Insulin Pen Needle (PEN NEEDLES 3/16") 31G X 5 MM MISC Use with Lantus solostar  . Lancets (ACCU-CHEK SOFT TOUCH) lancets Use as instructed  . montelukast (SINGULAIR) 10 MG tablet TAKE 1 TABLET BY MOUTH EVERYDAY AT BEDTIME  . omeprazole (PRILOSEC) 40 MG capsule Take 1 capsule (40 mg total) by mouth daily.  Marland Kitchen oxybutynin (DITROPAN) 5 MG tablet TAKE 1 TABLET BY MOUTH TWICE A DAY  . oxyCODONE (OXY IR/ROXICODONE) 5 MG immediate release tablet Take 1 tablet (5 mg total) by mouth every 6 (six) hours as needed for severe pain.  . polyethylene glycol (MIRALAX / GLYCOLAX) packet Take 17 g by mouth daily as needed for mild constipation.  . pregabalin (LYRICA) 50 MG capsule Take 1 capsule (50 mg total) by mouth 2 (two) times daily.  . VOLTAREN 1 % GEL APPLY 2 G TOPICALLY 4 (FOUR) TIMES DAILY AS NEEDED (KNEE PAIN). (Patient not taking: Reported on 09/14/2018)   No facility-administered encounter medications on file as of 06/15/2019.   Plan:  1. No follow up scheduled with LCSW 2. Contact information for LCSW provided, Patient will call for if needed.  Casimer Lanius, [place] / [place]   [phone] 3:41 PM

## 2019-06-15 NOTE — Patient Instructions (Signed)
Licensed Clinical Social Worker Visit Information Ms. Ikeda  it was nice speaking with you. Please call me directly if you have questions [phone] Goals we discussed today:  Goals Addressed            This Visit's Progress   . Advance Directives   On track    CARE PLAN ENTRY (see longitudinal plan of care for additional care plan information) Current Barriers & Progress:  . Patient with HTN, DMII, and Depression does not have an Advance Directive . Acknowledges deficits, education and support in order to complete this document . Patient received and review Advance Directive information as well EMMI education video . Patient has selected for health care agent Clinical Social Work Goal(s):  Marland Kitchen Over the next 45 days, the patient will  work with care management team on completion of Advance Directive, notarize and provide a copy to provider office Interventions provided by LCSW: .  Discussed educational handout on Advance Directives as well as an Proofreader packet . voluntary discussion about advanced care planning (advanced directives, healthcare proxy and living will) as well as assisted patient with completing paperwork  Patient Self Care Activities:  . Is able to complete documentation independently . Able to identify Health Care Power of Attorney/Health Care Agent . Patient completed document will have notarized and bring a copy to the office Please see past updates related to this goal by clicking on the "Past Updates" button in the selected goal      Materials provided: Ms. Fleet received Care Management services today:  1. Care Management services include personalized support from designated clinical staff supervised by her physician, including individualized plan of care and coordination with other care providers 2. 24/7 contact [phone] for assistance for urgent and routine care needs. 3. Care Management are voluntary services and be declined at any time by  calling the office.  Patient verbalizes understanding of instructions provided today.  Follow up plan: No follow up scheduled   Soundra Pilon, LCSW

## 2019-07-03 ENCOUNTER — Other Ambulatory Visit: Payer: Self-pay | Admitting: Family Medicine

## 2019-07-06 ENCOUNTER — Telehealth: Payer: Self-pay

## 2019-07-06 NOTE — Telephone Encounter (Signed)
Prior approval for Clonidine [place] completed via [place] [employer]. Med approved. Prior approval #[id number]. [facility] pharmacy informed.

## 2019-08-01 ENCOUNTER — Encounter: Payer: Self-pay | Admitting: Family Medicine

## 2019-08-03 ENCOUNTER — Other Ambulatory Visit: Payer: Self-pay | Admitting: Family Medicine

## 2019-08-03 ENCOUNTER — Telehealth: Payer: Self-pay

## 2019-08-03 DIAGNOSIS — G8929 Other chronic pain: Secondary | ICD-10-CM

## 2019-08-03 DIAGNOSIS — E0810 Diabetes mellitus due to underlying condition with ketoacidosis without coma: Secondary | ICD-10-CM

## 2019-08-03 DIAGNOSIS — M79641 Pain in right hand: Secondary | ICD-10-CM

## 2019-08-03 MED ORDER — OXYCODONE HCL 5 MG PO TABS: 5.0000 mg | ORAL_TABLET | Freq: Four times a day (QID) | ORAL | 0 refills | Status: DC | PRN

## 2019-08-03 MED ORDER — TRULICITY 1.5 MG/0.5ML ~~LOC~~ SOAJ: 1.5000 mg | SUBCUTANEOUS | 5 refills | Status: DC

## 2019-08-03 MED ORDER — PREGABALIN 50 MG PO CAPS: 50.0000 mg | ORAL_CAPSULE | Freq: Two times a day (BID) | ORAL | 0 refills | Status: DC

## 2019-08-03 NOTE — Progress Notes (Signed)
Medication refill request:   Trulicity: discontinued on 02/19/19, no reason as to why. Patient was supposed to f/u for DM. Will refill- have asked patient for f/u appt.   Clonidine: almost out. Needs BP follow up to review medications.   Oxycodone: have discussed tapering off of this in the past. Will refill today and re-visit at future visit.   [name], M.D.  4:46 PM 08/03/2019

## 2019-08-03 NOTE — Telephone Encounter (Signed)
Patient calls nurse line requesting a refill on Trulicity. However, I do not see this medication on her med list. Will send to PCP.

## 2019-08-08 ENCOUNTER — Telehealth: Payer: Self-pay

## 2019-08-08 NOTE — Telephone Encounter (Signed)
Completed PA info in [place] [employer] for Oxycodone. Status pending. Will recheck status in 24 hours.   

## 2019-08-09 NOTE — Telephone Encounter (Signed)
Med approved for 08/08/2019-02/04/2020. Prior approval [id number]. [facility] informed.

## 2019-08-10 ENCOUNTER — Ambulatory Visit: Payer: Self-pay | Admitting: Family Medicine

## 2019-08-15 ENCOUNTER — Encounter: Payer: Self-pay | Admitting: Family Medicine

## 2019-08-15 ENCOUNTER — Ambulatory Visit: Payer: Medicaid Other | Admitting: Family Medicine

## 2019-08-21 NOTE — Progress Notes (Deleted)
   PROVIDER AGENDA:  ***  Adult vaccines due  Topic Date Due  . TETANUS/TDAP  07/05/2027  . PNEUMOCOCCAL POLYSACCHARIDE VACCINE AGE 53-64 HIGH RISK  Completed     Patient Active Problem List   Diagnosis Date Noted  . Encounter for chronic pain management 02/11/2014    Priority: High  . Diabetes (HCC) 02/11/2014    Priority: High  . 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 Depression, recurrent (HCC) 03/24/2006    Priority: High  . Essential (primary) hypertension 03/24/2006    Priority: High  . Allergic rhinitis 05/30/2008    Priority: Medium  . OBESITY HYPOVENTILATION SYNDROME 12/27/2007    Priority: Medium  . Obstructive sleep apnea 10/30/2007    Priority: Medium  . Gastro-esophageal reflux disease without esophagitis 03/24/2006    Priority: Low  . Insomnia, idiopathic 09/30/2018  . Grade I diastolic dysfunction 09/30/2018  . Cystocele 09/14/2012  . Chronic pain of both knees 05/28/2010  . BMI 60.0-69.9, adult (HCC) 04/10/2007  . MYASTHENIA 03/07/2007     SUBJECTIVE:  CHIEF COMPLAINT / HPI:   Blood Pressure   Diabetes ** Foot exam today**    Chronic Pain   Depression  **PHQ - 9 **   PERTINENT  PMH / PSH: ***   OBJECTIVE:  There were no vitals taken for this visit.  ***  ASSESSMENT/PLAN:  No problem-specific Assessment & Plan notes found for this encounter.    [name], MD [facility]

## 2019-08-22 ENCOUNTER — Ambulatory Visit: Payer: Self-pay | Admitting: Family Medicine

## 2019-08-22 ENCOUNTER — Other Ambulatory Visit: Payer: Self-pay

## 2019-08-30 ENCOUNTER — Other Ambulatory Visit: Payer: Self-pay | Admitting: Family Medicine

## 2019-08-30 DIAGNOSIS — E0810 Diabetes mellitus due to underlying condition with ketoacidosis without coma: Secondary | ICD-10-CM

## 2019-08-31 ENCOUNTER — Other Ambulatory Visit: Payer: Self-pay

## 2019-08-31 ENCOUNTER — Ambulatory Visit: Payer: Medicaid Other | Admitting: Family Medicine

## 2019-08-31 VITALS — BP 162/88 | Ht 63.0 in | Wt 345.0 lb

## 2019-08-31 DIAGNOSIS — G5601 Carpal tunnel syndrome, right upper limb: Secondary | ICD-10-CM | POA: Diagnosis not present

## 2019-08-31 DIAGNOSIS — M25511 Pain in right shoulder: Secondary | ICD-10-CM | POA: Diagnosis not present

## 2019-09-03 ENCOUNTER — Encounter: Payer: Self-pay | Admitting: Neurology

## 2019-09-03 ENCOUNTER — Other Ambulatory Visit: Payer: Self-pay

## 2019-09-03 DIAGNOSIS — R202 Paresthesia of skin: Secondary | ICD-10-CM

## 2019-09-03 NOTE — Progress Notes (Signed)
  [name] - 54 y.o. female MRN [id number]  Date of birth: 10/10/1965    SUBJECTIVE:      Chief Complaint:/ HPI:  Bilateral but right greater than left hand numbness. Has had carpal tunnel surgery on that side and feels exactly like her previous symptoms. Numbness in all but little finger. Pretty much all day and awakens at night sometimes even though she continues to wear cock up wrist splint.  #2. Right shoulder pain with reaching forward and elevation above shoulder height. Occurring more often last 1 month. No new injury.    OBJECTIVE: BP (!) 162/88   Ht 5\' 3"  (1.6 m)   Wt (!) 345 lb (156.5 kg)   BMI 61.11 kg/m   Physical Exam:  Vital signs are reviewed. WRISTS: + tinels and phalens on right. No thenar atrophy. SHOULDERS: Right shoulder normal ROM bu has some pain with supraspinatus testing but normal strength in all planes of rotator cuff. Tightness in posterior capsule noted with crossover.  ASSESSMENT & PLAN:  1. Recurrence of carpal tunnel symptoms on right with hx prior surgery. Classic symptoms and exam. Set up NCS. 2. 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 Will start with HEP. HO given with theraband. Adding posterior capsule stretch. F.u both after NCS

## 2019-09-17 ENCOUNTER — Other Ambulatory Visit: Payer: Self-pay | Admitting: Family Medicine

## 2019-09-17 DIAGNOSIS — G8929 Other chronic pain: Secondary | ICD-10-CM

## 2019-10-04 ENCOUNTER — Ambulatory Visit (INDEPENDENT_AMBULATORY_CARE_PROVIDER_SITE_OTHER): Payer: Medicaid Other | Admitting: Neurology

## 2019-10-04 ENCOUNTER — Other Ambulatory Visit: Payer: Self-pay

## 2019-10-04 DIAGNOSIS — R202 Paresthesia of skin: Secondary | ICD-10-CM

## 2019-10-04 DIAGNOSIS — G5601 Carpal tunnel syndrome, right upper limb: Secondary | ICD-10-CM

## 2019-10-04 NOTE — Procedures (Signed)
[facility]  [address], [address]  [place], [place] [address] Tel: [phone] Fax:  [phone] Test Date:  10/04/2019  Patient: [name] DOB: Jun 09, 1965 Physician: Nita Sickle, DO  Sex: Female Height: 5\' 3"  Ref Phys: , MD  ID#: Denny Levy Temp: 32.0C Technician:    Patient Complaints: This is a 54 year old female referred for evaluation of recurrent carpal tunnel syndrome.  NCV & EMG Findings: Extensive electrodiagnostic testing of the right upper extremity shows:  1. Right median sensory response shows showed prolonged distal peak latency (5.8 ms) and reduced amplitude (7.2 V).  Right ulnar sensory responses within normal limits. 2. Right median motor response shows prolonged distal onset latency (4.7 ms) and reduced amplitude (3.1 mV).  Right ulnar motor response is within normal limits.  3. Chronic motor axonal loss changes are seen affecting the right abductor pollicis brevis muscle, without accompanied active denervation  Impression: 1. 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   2. There is no evidence of a cervical radiculopathy affecting the right upper extremity.   ___________________________ 40, DO    Nerve Conduction Studies Anti Sensory Summary Table   Stim Site NR Peak (ms) Norm Peak (ms) P-T Amp (V) Norm P-T Amp  Right Median Anti Sensory (2nd Digit)  32C  Wrist    5.8 <3.6 7.2 >15  Right Ulnar Anti Sensory (5th Digit)  32C  Wrist    2.5 <3.1 27.2 >10   Motor Summary Table   Stim Site NR Onset (ms) Norm Onset (ms) O-P Amp (mV) Norm O-P Amp Site1 Site2 Delta-0 (ms) Dist (cm) Vel (m/s) Norm Vel (m/s)  Right Median Motor (Abd Poll Brev)  32C  Wrist    4.7 <4.0 3.1 >6 Elbow Wrist 4.8 27.0 56 >50  Elbow    9.5  2.7         Right Ulnar Motor (Abd Dig Minimi)  32C  Wrist    2.2 <3.1 11.7 >7 B Elbow Wrist 3.9 23.0 59 >50  B Elbow     6.1  10.8  A Elbow B Elbow 1.9 10.0 53 >50  A Elbow    8.0  10.3          EMG   Side Muscle Ins Act Fibs Psw Fasc Number Recrt Dur Dur. Amp Amp. Poly Poly. Comment  Right 1stDorInt Nml Nml Nml Nml Nml Nml Nml Nml Nml Nml Nml Nml N/A  Right Abd Poll Brev Nml Nml Nml Nml 2- Rapid Many 1+ Many 1+ Many 1+ N/A  Right PronatorTeres Nml Nml Nml Nml Nml Nml Nml Nml Nml Nml Nml Nml N/A  Right Biceps Nml Nml Nml Nml Nml Nml Nml Nml Nml Nml Nml Nml N/A  Right Triceps Nml Nml Nml Nml Nml Nml Nml Nml Nml Nml Nml Nml N/A  Right Deltoid Nml Nml Nml Nml Nml Nml Nml Nml Nml Nml Nml Nml N/A      Waveforms:

## 2019-10-09 ENCOUNTER — Other Ambulatory Visit: Payer: Self-pay | Admitting: Family Medicine

## 2019-10-09 DIAGNOSIS — E0810 Diabetes mellitus due to underlying condition with ketoacidosis without coma: Secondary | ICD-10-CM

## 2019-10-12 ENCOUNTER — Encounter: Payer: Self-pay | Admitting: Family Medicine

## 2019-10-12 ENCOUNTER — Other Ambulatory Visit: Payer: Self-pay

## 2019-10-12 ENCOUNTER — Ambulatory Visit (INDEPENDENT_AMBULATORY_CARE_PROVIDER_SITE_OTHER): Payer: Medicaid Other | Admitting: Family Medicine

## 2019-10-12 VITALS — BP 128/74 | HR 114 | Ht 63.0 in | Wt 347.0 lb

## 2019-10-12 DIAGNOSIS — M170 Bilateral primary osteoarthritis of knee: Secondary | ICD-10-CM

## 2019-10-12 DIAGNOSIS — E1165 Type 2 diabetes mellitus with hyperglycemia: Secondary | ICD-10-CM

## 2019-10-12 DIAGNOSIS — J45909 Unspecified asthma, uncomplicated: Secondary | ICD-10-CM | POA: Diagnosis not present

## 2019-10-12 DIAGNOSIS — Z794 Long term (current) use of insulin: Secondary | ICD-10-CM

## 2019-10-12 DIAGNOSIS — M174 Other bilateral secondary osteoarthritis of knee: Secondary | ICD-10-CM

## 2019-10-12 DIAGNOSIS — Z23 Encounter for immunization: Secondary | ICD-10-CM | POA: Diagnosis not present

## 2019-10-12 LAB — POCT GLYCOSYLATED HEMOGLOBIN (HGB A1C): HbA1c, POC (controlled diabetic range): 9.1 % — AB (ref 0.0–7.0)

## 2019-10-12 NOTE — Patient Instructions (Signed)
Please write a MyChart message with everything that we talked about today to help me paint a better picture of how difficult things for you at home. We talked about a lot and it would help me remember more from the appointment. I can include some of this information into my letter to Medicaid.

## 2019-10-12 NOTE — Progress Notes (Signed)
SUBJECTIVE:   CHIEF COMPLAINT / HPI:   Face to Face mobility Exam   The following are medical conditions and symptoms relating to ambulatory difficulties and declining health issues.   BMI 61.5, chronic pain of both knees,   DJD of knee  postinflammatory pulmonary fibrosis   OHS   Carpal Tunnel Syndrome    Current height and weight: 347 lb, 5\' 3"   Current Devices & why they no longer are adwquate for assistance at home Walker and cane. Patient reports she is having hand pain (recently diagnosed with severe carpal tunnel syndrome. She continues to have pain despite help with the walker. Not leaving the house or doing things because she is afraid to fall. She can't even go to her front door. This can greatly affect her depression as well.   ADLs:  Difficulty with meal preparation, home management, grooming, dressing (has an aid that helps her do some things). She can't walk to the mailbox. Hygeine, continence The basic ADL include the following categories:  The patient is unable to use cane nor walker for resolution of condition inside of home for the same reason she cannot use a manual wheelchair: cannot use due to severe Carpal tunnel syndrome (NMG report provided), shortness of breath on exertion.   Objective ratings for  Pain: 11/10 of right knee  Enduarance: 0/10.  Patient can stand for 1 to 2 minutes at most.  She can move with her walker for about 3 to 4 minutes.  While checking into the office today, patient had to sit and rest as she could not tolerate walking from her car to check in and standing and waiting in line.  Patient is oriented and would be able to safely use a power wheelchair at home  Other details of patient's limitations and her daily struggles:  Patient's perspectives  Having a power chair will allow me to be able to get around better & be more self efficient in my home safely. Because of my limitations, I currently stay mostly in my bedroom.  A  Mobility power chair with lifting abilities, will allow me the ability to be able to cook again. The ability for it to lift me higher, up & down will be awesome to help me reach the pots and tend to the cooking food without burning myself & the food. It may also afford me the opportunity to wash my dishes as well, being able to be high enough & move around from place to place. Currently, I can't do either because I'm not able to stand or walk long. Sitting on a regular chair is to low & I cant keep getting up & down.   I'm very unstable while trying to stand & walk. 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 My left leg is worse than my right leg. I use a rollator walker & a cane to get around. I have to use both hands to hold on when walking. In the last 43yrs or so, It's become more & more difficult for me to get around using my walker & my cane. I have become very unstable, weaker & in constant pain in my knees, hands & in my back sometimes.   When I use my cane, I must also use my other hand to hold onto furnishings, walls, and sometimes other people as well for balance & security. I also have to use both hands with my walker. The pain in  my hands (especially my right), make it even more difficult, scary & unsafe for me to get around, plus I am a fall risk as well.  A power chair will also allow me to:  1. Maneuver from my front door to [employer]. Currently, I can only use my back door. If someone comes to the front door & I'm home alone, I can't access it because it's to far away for me to get to it in a timely manner.  2. Maneuver more in my kitchen, not only to cook and do my dishes, but also to get & carry a plate of food or something to drink for myself. (I will be able to carry them back to where I need them to be. Presently, I can't carry anything like a drink or food for myself because I'm using my hands to stabilize  myself, while walking with either my walker and/or my cane that I must hold onto while briefly standing or walking).   3.  Same type of scenario in my bedroom & living/diningroom areas. I have to have someone get and put away almost everything I need partly because I can't walk & carry and also because I can't walk or stand long enough to do it for myself.  4. Retrieve my own mail, insteadof having to wait days at atime for someone else to retrieve it for me.   5. I easily get over exerted, washed down in sweat and totally out of breath while in excruciating knee & hand pain daily, just trying to get around from one area to another in my home.At times I experience moments when I feel like I need some oxygen as well.  These pains and degenerations of ability to use my legs & hands, makes mobility extremely difficult, always painful and sometimes nearly impossible for me. I need a mobility power chair with the ability to lift me higher at times to regain some of my independence back. I still have lots of life to live, and I would love to be more self efficient doing it. My children would love that too, especially my 53yr old I'm still trying to raise, since I call on him a lot. My other 3 adult children will be happy as well because the more I can do for myself, the less they will have to worry about me needing or lacking.    OBJECTIVE:   BP 128/74   Pulse (!) 114   Wt (!) 347 lb (157.4 kg)   SpO2 96%   BMI 61.47 kg/m   General: obese female, NAD. Uses walker. Sitting comfortably. Upper extremity strength  5/5 bicep extention/flexion, bilaterally  5/5 wrist flexion/extenion/supination/pronation, bilaterally  3/5 hand grip bilaterally   Lower extremity strenght Hip abduction and abduction 3/5 bilaterally Right knee extension 1/5, flexion 3/5 Left knee extension and flexion 3/5   ASSESSMENT/PLAN:  Completed required information for [place] Medicaid Paperwork Guidelines for Power  Mobility [place] and written prescription template. This will be faxed with progress notes to [phone]    [name], MD [facility]

## 2019-10-15 ENCOUNTER — Encounter: Payer: Self-pay | Admitting: Family Medicine

## 2019-10-15 ENCOUNTER — Telehealth: Payer: Self-pay | Admitting: Family Medicine

## 2019-10-15 DIAGNOSIS — G5601 Carpal tunnel syndrome, right upper limb: Secondary | ICD-10-CM | POA: Insufficient documentation

## 2019-10-15 NOTE — Telephone Encounter (Signed)
lvm PNCV shows recurrence right carpal tunnel syndrome. I will also send her a letter but told her to call the office and let me know if she wanted me to set her up with surgeon. Has had prior release so I would send to hand specialist 9Dr. Amanda Pea?) most likeley.

## 2019-10-16 ENCOUNTER — Telehealth: Payer: Self-pay

## 2019-10-16 MED ORDER — BUDESONIDE-FORMOTEROL FUMARATE 160-4.5 MCG/ACT IN AERO: 2.0000 | INHALATION_SPRAY | Freq: Two times a day (BID) | RESPIRATORY_TRACT | 11 refills | Status: DC

## 2019-10-16 NOTE — Telephone Encounter (Signed)
Prior approval for [employer] completed via [employer]. Will take 24-48 hours for response.

## 2019-10-18 ENCOUNTER — Telehealth: Payer: Self-pay

## 2019-10-18 NOTE — Telephone Encounter (Signed)
Victorino Dike, representative from [employer], calls nurse line regarding status of mobility exam paperwork.   Victorino Dike is requesting documents to be faxed to her directly at [phone] once completed.   To PCP  Veronda Prude, RN

## 2019-10-19 ENCOUNTER — Encounter: Payer: Self-pay | Admitting: Family Medicine

## 2019-10-19 ENCOUNTER — Telehealth: Payer: Self-pay

## 2019-10-19 NOTE — Telephone Encounter (Signed)
Med approved for 09/21/201-10/15/2020. Prior approval #[id number].  [facility] informed.

## 2019-10-19 NOTE — Progress Notes (Unsigned)
Copy and pasted from multiple MyChart Messages.   Hi Dr. Selena Batten,  I wish we had recorded my visit! Trying to put our discussion on paper was somewhat difficult. When I get anxious, my thoughts freezes or disappears, leaving me with that "duuuh" syndrome! Lol . I hope this is good enough!  Having a power chair will allow me to be able to get around better & be more self efficient in my home safely. Because of my limitations, I currently stay mostly in my bedroom.  A Mobility power chair with lifting abilities, will allow me the ability to be able to cook again. The ability for it to lift me higher, up & down will be awesome to help me reach the pots and tend to the cooking food without burning myself & the food. It may also afford me the opportunity to wash my dishes as well, being able to be high enough & move around from place to place. Currently, I can't do either because I'm not able to stand or walk long. Sitting on a regular chair is to low & I cant keep getting up & down.   I'm very unstable while trying to stand & walk. 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 My left leg is worse than my right leg. I use a rollator walker & a cane to get around. I have to use both hands to hold on when walking. In the last 70yrs or so, It's become more & more difficult for me to get around using my walker & my cane. I have become very unstable, weaker & in constant pain in my knees, hands & in my back sometimes.  When I use my cane, I must also use my other hand to hold onto furnishings, walls, and sometimes other people as well for balance & security. I also have to use both hands with my walker. The pain in my hands (especially my right), make it even more difficult, scary & unsafe for me to get around, plus I am a fall risk as well.  A power chair will also allow me to:  1. Maneuver from my front door to [employer]. Currently, I can  only use my back door. If someone comes to the front door & I'm home alone, I can't access it because it's to far away for me to get to it in a timely manner.  2. Maneuver more in my kitchen, not only to cook and do my dishes, but also to get & carry a plate of food or something to drink for myself. (I will be able to carry them back to where I need them to be. Presently, I can't carry anything like a drink or food for myself because I'm using my hands to stabilize myself, while walking with either my walker and/or my cane that I must hold onto while briefly standing or walking).   3.  Same type of scenario in my bedroom & living/diningroom areas. I have to have someone get and put away almost everything I need partly because I can't walk & carry and also because I can't walk or stand long enough to do it for myself.  4. Retrieve my own mail, instead  of having to wait days at a  time for someone else to retrieve it  for me.  5. I easily get over exerted, washed down in sweat and totally out of breath while in excruciating knee & hand pain  daily, just trying to get around from one area to another in my home.At times I experience moments when I feel like I need some oxygen as well.  These pains and degenerations of ability to use my legs & hands, makes mobility extremely difficult, always painful and sometimes nearly impossible for me. I need a mobility power chair with the ability to lift me higher at times to regain some of my independence back. I still have lots of life to live, and I would love to be more self efficient doing it. My children would love that too, especially my 53yr old I'm still trying to raise, since I call on him a lot. My other 3 adult children will be happy as well because the more I can do for myself, the less they will have to worry about me needing or lacking.  *My preference is the (if Medicaid pays): --  "Vision Super with Seat Lift [phone])!" Mid-Wheel  Drive - Bariatric *Available with optional 10" seat elevator with 400lbs seat lift.  **Vision Super (P327), 450lbs, ONLY IF Ist option isn't covered by Medicaid.  Options: (* = I would especially like, if  covered by [employer]). However, I will take them all if they are covered, because this chair will most likely have to last me @ 23yrs before Insurance will pay for another one & I may need them all before then.    ------------------------------------------------------  Hi again Dr Selena Batten,  Pixie Casino, at [facility] called me this afternoon to see if I made my appointment with you on Friday (10/12/19) and how it went.  I told her that it went well.  That you asked me to put the things we discussed in writing via mychart to you, so you could paint an accurate picture in the narrative.  I told her that I did do that already.  I also told her that you told me that you should have it completed in 2 or 3 days and that you would let me know when you had.  I also asked her questions about what my insurance would or wouldn't cover.  Medicaid will only cover the standard version of the power chair, "Vision Super (P327).  The only options they will pay for is the ones that are medically necessary.  Since I stay in my room all the time, because I need to keep my legs elevated, I believe that I need and would greatly benefit from having the "Swing-Away Elevating Legrest" option for when I'm sitting on it for prolonged periods of time.  I can see myself being on it prolonged periods, like while cooking or washing dishes and appointments can become lengthy as well.  Another option I may need before I'm eligible for another power chair, is the oxygen tank holder.  I say this because I use to be on oxygen some years back and I still have occasions when I feel like I need oxygen, especially after movement and even more so after movement while masked.  I really feel like I'm gonna  fall out because I can't get enough breath during these episodes.  Currently I have to keep a fan running 24/7, blowing air in my face so I can breathe, less distressful.  I have to feel that air on my face!.  Also, I get extremely hot for no apparent reason, often.  I keep the [facility] & my fan running 24/7, even in the colder months.  Everyone complains that my  room is an icebox all the time, yet I may be comfortable or still hot!  I have to keep my knees covered so they don't ache, as well as my shoulders & hands to at times.  Please take these things into consideration when detailing the prescription for my mobility power chair.  Sincerely,  [name]

## 2019-10-19 NOTE — Telephone Encounter (Signed)
Left VM for patient with appt info:  Dr. [facility] [address] [place] [phone] Appt: 10/29/19 @ 11 am

## 2019-10-23 ENCOUNTER — Encounter: Payer: Self-pay | Admitting: Family Medicine

## 2019-10-23 DIAGNOSIS — M170 Bilateral primary osteoarthritis of knee: Secondary | ICD-10-CM | POA: Insufficient documentation

## 2019-10-23 NOTE — Telephone Encounter (Signed)
Received follow up phone call from [place] at [facility]. Representative is requesting paperwork to be completed as soon as possible so that patient's wheelchair request can be processed.   Informed that it seems that provider is still working on paperwork, as it has been removed from provider box. [name] that we would fax her the paperwork as soon as it is completed.   To PCP  Veronda Prude, RN

## 2019-10-23 NOTE — Telephone Encounter (Signed)
Still working on letter for patient. Will personally fax when completed. Will complete by end of business week.

## 2019-11-05 ENCOUNTER — Other Ambulatory Visit: Payer: Self-pay

## 2019-11-05 ENCOUNTER — Encounter (HOSPITAL_BASED_OUTPATIENT_CLINIC_OR_DEPARTMENT_OTHER): Payer: Self-pay | Admitting: Orthopedic Surgery

## 2019-11-05 ENCOUNTER — Encounter: Payer: Self-pay | Admitting: Family Medicine

## 2019-11-05 DIAGNOSIS — Z6841 Body Mass Index (BMI) 40.0 and over, adult: Secondary | ICD-10-CM

## 2019-11-05 NOTE — Progress Notes (Signed)
Chart reviewed by Dr.Whitman, patient is not a candidate for [facility]. Spoke with Darel Hong at Dr. Selina Cooley office, and will need to be at main OR.

## 2019-11-09 ENCOUNTER — Telehealth: Payer: Self-pay | Admitting: *Deleted

## 2019-11-09 NOTE — Telephone Encounter (Signed)
Community message sent about pts DME order for bariatric bedside commode. Marcin Holte Zimmerman Rumple, CMA

## 2019-11-14 NOTE — Progress Notes (Signed)
DUE TO COVID-19 ONLY ONE VISITOR IS ALLOWED TO COME WITH YOU AND STAY IN THE WAITING ROOM ONLY DURING PRE OP AND PROCEDURE DAY OF SURGERY. THE 1 VISITOR  MAY VISIT WITH YOU AFTER SURGERY IN YOUR PRIVATE ROOM DURING VISITING HOURS ONLY!  YOU NEED TO HAVE A COVID 19 TEST ON_10/25/2021 ______ @_______ , THIS TEST MUST BE DONE BEFORE SURGERY,  COVID TESTING SITE [address] [place] , IT IS ON THE RIGHT GOING OUT [address] APPROXIMATELY  2 MINUTES PAST [employer] ON THE RIGHT. ONCE YOUR COVID TEST IS COMPLETED,  PLEASE BEGIN THE QUARANTINE INSTRUCTIONS AS OUTLINED IN YOUR HANDOUT.                [name]  11/14/2019   Your procedure is scheduled on:  11/22/2019   Report to [facility] Main  Entrance   Report to admitting at    1110 AM     Call this number if you have problems the morning of surgery [phone]    Remember: Do not eat food , candy gum or mints :After Midnight. You may have clear liquids from midnight until   1010am    CLEAR LIQUID DIET   Foods Allowed                                                                       Coffee and tea, regular and decaf                              Plain Jell-O any favor except red or purple                                            Fruit ices (not with fruit pulp)                                      Iced Popsicles                                     Carbonated beverages, regular and diet                                    Cranberry, grape and apple juices Sports drinks like Gatorade Lightly seasoned clear broth or consume(fat free) Sugar, honey syrup   _____________________________________________________________________    BRUSH YOUR TEETH MORNING OF SURGERY AND RINSE YOUR MOUTH OUT, NO CHEWING GUM CANDY OR MINTS.     Take these medicines the morning of surgery with A SIP OF WATER: Lyrica, Prilosec, zyrtec, wellbutrin, inhalers as usual and bring Take 1/2 of lantuse dose  of Insulin nite before bedtime.   DO NOT TAKE ANY DIABETIC MEDICATIONS DAY OF YOUR SURGERY  You may not have any metal on your body including hair pins and              piercings  Do not wear jewelry, make-up, lotions, powders or perfumes, deodorant             Do not wear nail polish on your fingernails.  Do not shave  48 hours prior to surgery.              Men may shave face and neck.   Do not bring valuables to the hospital. [place].  Contacts, dentures or bridgework may not be worn into surgery.  Leave suitcase in the car. After surgery it may be brought to your room.     Patients discharged the day of surgery will not be allowed to drive home. IF YOU ARE HAVING SURGERY AND GOING HOME THE SAME DAY, YOU MUST HAVE AN ADULT TO DRIVE YOU HOME AND BE WITH YOU FOR 24 HOURS. YOU MAY GO HOME BY TAXI OR UBER OR ORTHERWISE, BUT AN ADULT MUST ACCOMPANY YOU HOME AND STAY WITH YOU FOR 24 HOURS.  Name and phone number of your driver:  Special Instructions: N/A              Please read over the following fact sheets you were given: _____________________________________________________________________  [facility] - Preparing for Surgery Before surgery, you can play an important role.  Because skin is not sterile, your skin needs to be as free of germs as possible.  You can reduce the number of germs on your skin by washing with CHG (chlorahexidine gluconate) soap before surgery.  CHG is an antiseptic cleaner which kills germs and bonds with the skin to continue killing germs even after washing. Please DO NOT use if you have an allergy to CHG or antibacterial soaps.  If your skin becomes reddened/irritated stop using the CHG and inform your nurse when you arrive at Short Stay. Do not shave (including legs and underarms) for at least 48 hours prior to the first CHG shower.  You may shave your face/neck. Please follow these  instructions carefully:  1.  Shower with CHG Soap the night before surgery and the  morning of Surgery.  2.  If you choose to wash your hair, wash your hair first as usual with your  normal  shampoo.  3.  After you shampoo, rinse your hair and body thoroughly to remove the  shampoo.                           4.  Use CHG as you would any other liquid soap.  You can apply chg directly  to the skin and wash                       Gently with a scrungie or clean washcloth.  5.  Apply the CHG Soap to your body ONLY FROM THE NECK DOWN.   Do not use on face/ open                           Wound or open sores. Avoid contact with eyes, ears mouth and genitals (private parts).  Wash face,  Genitals (private parts) with your normal soap.             6.  Wash thoroughly, paying special attention to the area where your surgery  will be performed.  7.  Thoroughly rinse your body with warm water from the neck down.  8.  DO NOT shower/wash with your normal soap after using and rinsing off  the CHG Soap.                9.  Pat yourself dry with a clean towel.            10.  Wear clean pajamas.            11.  Place clean sheets on your bed the night of your first shower and do not  sleep with pets. Day of Surgery : Do not apply any lotions/deodorants the morning of surgery.  Please wear clean clothes to the hospital/surgery center.  FAILURE TO FOLLOW THESE INSTRUCTIONS MAY RESULT IN THE CANCELLATION OF YOUR SURGERY PATIENT SIGNATURE_________________________________  NURSE SIGNATURE__________________________________  ________________________________________________________________________

## 2019-11-16 ENCOUNTER — Encounter (HOSPITAL_COMMUNITY): Payer: Self-pay

## 2019-11-16 ENCOUNTER — Other Ambulatory Visit: Payer: Self-pay | Admitting: Family Medicine

## 2019-11-16 ENCOUNTER — Other Ambulatory Visit: Payer: Self-pay

## 2019-11-16 ENCOUNTER — Encounter (HOSPITAL_COMMUNITY)
Admission: RE | Admit: 2019-11-16 | Discharge: 2019-11-16 | Disposition: A | Discharge status: Home / Self Care | Payer: Medicaid Other | Source: Ambulatory Visit | Attending: Orthopedic Surgery | Admitting: Orthopedic Surgery

## 2019-11-16 DIAGNOSIS — I10 Essential (primary) hypertension: Secondary | ICD-10-CM | POA: Diagnosis not present

## 2019-11-16 DIAGNOSIS — Z794 Long term (current) use of insulin: Secondary | ICD-10-CM | POA: Insufficient documentation

## 2019-11-16 DIAGNOSIS — E119 Type 2 diabetes mellitus without complications: Secondary | ICD-10-CM | POA: Insufficient documentation

## 2019-11-16 DIAGNOSIS — Z01818 Encounter for other preprocedural examination: Secondary | ICD-10-CM | POA: Insufficient documentation

## 2019-11-16 DIAGNOSIS — G5601 Carpal tunnel syndrome, right upper limb: Secondary | ICD-10-CM | POA: Diagnosis not present

## 2019-11-16 DIAGNOSIS — G4733 Obstructive sleep apnea (adult) (pediatric): Secondary | ICD-10-CM | POA: Insufficient documentation

## 2019-11-16 DIAGNOSIS — G8929 Other chronic pain: Secondary | ICD-10-CM

## 2019-11-16 DIAGNOSIS — Z79899 Other long term (current) drug therapy: Secondary | ICD-10-CM | POA: Insufficient documentation

## 2019-11-16 DIAGNOSIS — E0810 Diabetes mellitus due to underlying condition with ketoacidosis without coma: Secondary | ICD-10-CM

## 2019-11-16 LAB — COMPREHENSIVE METABOLIC PANEL
ALT: 18 U/L (ref 0–44)
AST: 20 U/L (ref 15–41)
Albumin: 3.4 g/dL — ABNORMAL LOW (ref 3.5–5.0)
Alkaline Phosphatase: 115 U/L (ref 38–126)
Anion gap: 10 (ref 5–15)
BUN: 11 mg/dL (ref 6–20)
CO2: 27 mmol/L (ref 22–32)
Calcium: 9 mg/dL (ref 8.9–10.3)
Chloride: 101 mmol/L (ref 98–111)
Creatinine, Ser: 0.76 mg/dL (ref 0.44–1.00)
GFR, Estimated: >60 mL/min (ref >60)
Glucose, Bld: 194 mg/dL — ABNORMAL HIGH (ref 70–99)
Potassium: 3.9 mmol/L (ref 3.5–5.1)
Sodium: 138 mmol/L (ref 135–145)
Total Bilirubin: 0.6 mg/dL (ref 0.3–1.2)
Total Protein: 7.8 g/dL (ref 6.5–8.1)

## 2019-11-16 LAB — CBC
HCT: 41 % (ref 36.0–46.0)
Hemoglobin: 12.9 g/dL (ref 12.0–15.0)
MCH: 27 pg (ref 26.0–34.0)
MCHC: 31.5 g/dL (ref 30.0–36.0)
MCV: 86 fL (ref 80.0–100.0)
Platelets: 311 10*3/uL (ref 150–400)
RBC: 4.77 MIL/uL (ref 3.87–5.11)
RDW: 15 % (ref 11.5–15.5)
WBC: 10.3 10*3/uL (ref 4.0–10.5)
nRBC: 0 % (ref 0.0–0.2)

## 2019-11-16 LAB — GLUCOSE, CAPILLARY: Glucose-Capillary: 189 mg/dL — ABNORMAL HIGH (ref 70–99)

## 2019-11-16 NOTE — Progress Notes (Addendum)
Anesthesia Review:  PCP: [facility] with [facility] per pt  Cardiologist : Chest x-ray : EKG :11/16/19 final report in epic  Echo : 2020- epic  Stress test: Cardiac Cath :  Activity level: cannot do a flight of stairs without difficulty  Sleep Study/ CPAP : Fasting Blood Sugar :      / Checks Blood Sugar -- times a day:   Blood Thinner/ Instructions /Last Dose: ASA / Instructions/ Last Dose :  Pt seen on proep appt by Sheilah Mins due to morbid obesity and has sleep apnea, DM and htn.   Pt checks glucose at home daily.  Per pt .  HGBA!C-9.1 on 10/07/19- epic  At time of preop appt pt had not taken medds for the day and pt had not had anything to eat or drink per pt .  hgba1c done 11/16/19 9.0 routed via epic to Dr Ave Filter.

## 2019-11-17 LAB — HEMOGLOBIN A1C
Hgb A1c MFr Bld: 9 % — ABNORMAL HIGH (ref 4.8–5.6)
Mean Plasma Glucose: 212 mg/dL

## 2019-11-19 ENCOUNTER — Other Ambulatory Visit (HOSPITAL_COMMUNITY)
Admission: RE | Admit: 2019-11-19 | Discharge: 2019-11-19 | Disposition: A | Discharge status: Home / Self Care | Payer: Medicaid Other | Source: Ambulatory Visit | Attending: Orthopedic Surgery | Admitting: Orthopedic Surgery

## 2019-11-19 DIAGNOSIS — Z20822 Contact with and (suspected) exposure to covid-19: Secondary | ICD-10-CM | POA: Insufficient documentation

## 2019-11-19 DIAGNOSIS — Z01812 Encounter for preprocedural laboratory examination: Secondary | ICD-10-CM | POA: Insufficient documentation

## 2019-11-19 LAB — SARS CORONAVIRUS 2 (TAT 6-24 HRS): SARS Coronavirus 2: NEGATIVE

## 2019-11-19 NOTE — Progress Notes (Signed)
Anesthesia Chart Review:   Case: [id number] Date/Time: 11/22/19 1210   Procedure: RIGHT HAND CARPAL TUNNEL RELEASE (Right )   Anesthesia type: General   Pre-op diagnosis: RIGHT HAND CARPAL TUNNEL SYNDROME   Location: [facility]OM 09 / [facility]   Surgeons: Tania Ade, MD      DISCUSSION:  Pt is a 54 year old with hx HTN, DM, OSA, obesity hypoventilation syndrome, sarcoidosis (manifestations mostly derm, no signs/sx pulmonary disease at last visit with pulmonology 07/2016), asthma, RA, myasthenia gravis (by notes, no recent flares)  Pt is morbidly obese. Pt is sedentary. Pt has requested PCP approve a power mobility chair.   DM is uncontrolled; HbA1c 9.0   VS: BP (!) 183/98   Pulse 99   Temp 36.8 C (Oral)   SpO2 99%    PROVIDERS: PCP is Wilber Oliphant, MD at the [place]:  - HbA1c is 9.0, glucose 194. I routed A1c to Dr. Tamera Punt for review.   (all labs ordered are listed, but only abnormal results are displayed)  Labs Reviewed  COMPREHENSIVE METABOLIC PANEL - Abnormal; Notable for the following components:      Result Value   Glucose, Bld 194 (*)    Albumin 3.4 (*)    All other components within normal limits  HEMOGLOBIN A1C - Abnormal; Notable for the following components:   Hgb A1c MFr Bld 9.0 (*)    All other components within normal limits  GLUCOSE, CAPILLARY - Abnormal; Notable for the following components:   Glucose-Capillary 189 (*)    All other components within normal limits  CBC    EKG 11/16/19: NSR. Cannot rule out Inferior infarct, age undetermined Cannot rule out Anterior infarct, age undetermined.    CV:  Echo 09/28/18:  1. The left ventricle has normal systolic function with an ejection fraction of 60-65%. The cavity size was normal. There is moderately increased left ventricular wall thickness. Left ventricular diastolic Doppler parameters are consistent with impaired relaxation. Indeterminate filling pressures The E/e' is  8-15. No evidence of left ventricular regional wall motion abnormalities.  2. The mitral valve is grossly normal.  3. The aortic valve is tricuspid. No stenosis of the aortic valve.  4. The aorta is normal unless otherwise noted.  5. The interatrial septum was not well visualized.     Past Medical History:  Diagnosis Date  . ALLERGIC RHINITIS    takes Zyrtec daily  . Anxiety   . Arthritis   . Asthma    Albuterol prn;Symbicort daily and Singulair daily  . Bronchitis    hx of  . CAP (community acquired pneumonia) 02/20/2015  . Carpal tunnel syndrome    right  . Chronic back pain   . Constipation    Miralax prn  . Depression    takes Cymbalta daily  . Diabetes ([place])    type 2   . Dizziness    pt states she gets off balance occasionally  . Eczema   . Gallstones 2010  . GERD (gastroesophageal reflux disease)    takes Omeprazole daily  . Headache(784.0)    couple of times a week  . Hemorrhoids   . Hypertension    takes Prinizide daily and Clonidine on Mondays  . Hypoventilation associated with obesity syndrome ([place])   . Insomnia   . Irritable bladder   . Joint pain   . Joint swelling   . Lung disease    scleraderma  . Morbid (severe) obesity due  to excess calories ([place]) 04/10/2007   Restrictive changes on pfts 11/30/2007    . Myasthenia gravis 1994   Positive Acetylcholine receptor Ab and single fiber EMG Dr. Barbaraann Share [facility] 1996- Dr. Rosiland Oz 1998, Dr Gaynell Face 2008 [facility]- Failed prednisone due to weight gain, stopped imuran/mestinon due to finances- Now with quiescent disease per Dr Gaynell Face and no flares in many years  . Myasthenia gravis   . Nocturia   . OSA (obstructive sleep apnea)    uses pillows   . Osteoarthritis   . Pelvic inflammatory disease (PID)   . Peptic ulcer   . Pneumonia    hx of;last time about 1-27yr ago  . 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 VChesley Mires 10/30/2007 ANA positive, ANA titer   negative, RF less than 20  . Rheumatoid arthritis(714.0)   . Sarcoidosis 12/23/2010   Derm  dx NCG  12/03/10 (path report in EPIC)    Overview:  Overview:  Diagnosed on skin biopsy November 2012  Last Assessment & Plan:  Labs are unremarkable. CXR improved/stable. PFT wnl. I am happy patient had these tests and we have a baseline for her. At this time, I do not see an indication for starting chronic steroids daily. Patient agrees. Her skin lesions are bothering her the most and syst  . Shortness of breath    can be sitting as well as exertion  . Skin irritation    skin itchy  . Trigger finger   . Urinary frequency    takes Ditropan daily  . Urinary urgency   . Viral meningitis    history of viral meningitis    Past Surgical History:  Procedure Laterality Date  . BRONCHOSCOPY  08/2007  . CARPAL TUNNEL RELEASE  05/20/2011   Procedure: CARPAL TUNNEL RELEASE;  Surgeon: JNita Sells MD;  [place] [facility]  Service: Orthopedics;  Laterality: Right;  . CESAREAN SECTION  09/15/2004  . COLONOSCOPY N/A 01/21/2014   Procedure: COLONOSCOPY;  Surgeon: CGatha Mayer MD;  Location: [facility];  Service: Endoscopy;  Laterality: N/A;  . cortisone injection     receives an injection every 322month . ESOPHAGOGASTRODUODENOSCOPY    . ESOPHAGOGASTRODUODENOSCOPY N/A 01/21/2014   Procedure: ESOPHAGOGASTRODUODENOSCOPY (EGD);  Surgeon: CaGatha MayerMD;  [facility];  Service: Endoscopy;  Laterality: N/A;  . LAPAROSCOPIC CHOLECYSTECTOMY  07/2008   by Dr. ChNeldon Mc. Thymus resection  08/25/1993  . TRIGGER FINGER RELEASE  05/20/2011   Procedure: RELEASE TRIGGER FINGER/A-1 PULLEY;  Surgeon: JuNita SellsMD;  [place] [facility] Service: Orthopedics;  Laterality: Right;  RIGHT TRIGGER THUMB RELEASE AND RIGHT CARPAL TUNNEL RELEASE    MEDICATIONS: . ACCU-CHEK AVIVA PLUS test strip  . Accu-Chek Softclix Lancets lancets  . acetaminophen (TYLENOL) 500 MG tablet  .  albuterol (PROAIR HFA) 108 (90 Base) MCG/ACT inhaler  . amLODipine (NORVASC) 5 MG tablet  . Blood Glucose Monitoring Suppl (ACCU-CHEK AVIVA PLUS) w/Device KIT  . budesonide-formoterol (SYMBICORT) 160-4.5 MCG/ACT inhaler  . buPROPion (WELLBUTRIN XL) 300 MG 24 hr tablet  . CATAPRES-TTS-3 0.3 MG/24HR patch  . cetirizine (ZYRTEC) 10 MG tablet  . cloNIDine (CATAPRES) 0.1 MG tablet  . Dulaglutide (TRULICITY) 1.5 MGEY/8.1KGOPN  . FLUoxetine (PROZAC) 20 MG tablet  . furosemide (LASIX) 20 MG tablet  . hydrochlorothiazide (HYDRODIURIL) 25 MG tablet  . Insulin Pen Needle (PEN NEEDLES 3/16") 31G X 5 MM MISC  . LANTUS SOLOSTAR 100 UNIT/ML  Solostar Pen  . montelukast (SINGULAIR) 10 MG tablet  . omeprazole (PRILOSEC) 40 MG capsule  . oxybutynin (DITROPAN) 5 MG tablet  . oxyCODONE (OXY IR/ROXICODONE) 5 MG immediate release tablet  . polyethylene glycol (MIRALAX / GLYCOLAX) packet  . pregabalin (LYRICA) 50 MG capsule  . VOLTAREN 1 % GEL   No current facility-administered medications for this encounter.    If no changes, I anticipate pt can proceed with surgery as scheduled.   Willeen Cass, PhD, FNP-BC [facility]/Anesthesiology Phone: [phone] 11/19/2019 4:21 PM

## 2019-11-19 NOTE — Anesthesia Preprocedure Evaluation (Addendum)
Anesthesia Evaluation  Patient identified by MRN, date of birth, ID band Patient awake    Reviewed: Allergy & Precautions, NPO status , Patient's Chart, lab work & pertinent test results  Airway Mallampati: II  TM Distance: >3 FB Neck ROM: Full    Dental  (+) Missing,    Pulmonary asthma , sleep apnea ,    breath sounds clear to auscultation       Cardiovascular hypertension,  Rhythm:Regular Rate:Normal     Neuro/Psych  Headaches, PSYCHIATRIC DISORDERS Anxiety Depression  Neuromuscular disease    GI/Hepatic PUD, GERD  Medicated,  Endo/Other  diabetes, Type 2  Renal/GU      Musculoskeletal  (+) Arthritis ,   Abdominal (+) + obese,   Peds  Hematology negative hematology ROS (+)   Anesthesia Other Findings - Myasthenia Gravis  Reproductive/Obstetrics                           Anesthesia Physical Anesthesia Plan  ASA: IV  Anesthesia Plan: General   Post-op Pain Management:    Induction: Intravenous  PONV Risk Score and Plan: 4 or greater and Ondansetron, Midazolam, Treatment may vary due to age or medical condition, Scopolamine patch - Pre-op and Dexamethasone  Airway Management Planned: LMA  Additional Equipment: None  Intra-op Plan:   Post-operative Plan: Extubation in OR  Informed Consent: I have reviewed the patients History and Physical, chart, labs and discussed the procedure including the risks, benefits and alternatives for the proposed anesthesia with the patient or authorized representative who has indicated his/her understanding and acceptance.       Plan Discussed with: CRNA  Anesthesia Plan Comments: (See APP note by Joslyn Hy, FNP )      Anesthesia Quick Evaluation

## 2019-11-22 ENCOUNTER — Ambulatory Visit (HOSPITAL_COMMUNITY): Payer: Medicaid Other | Admitting: Anesthesiology

## 2019-11-22 ENCOUNTER — Encounter (HOSPITAL_COMMUNITY)
Admission: RE | Disposition: A | Discharge status: Home / Self Care | Payer: Self-pay | Source: Home / Self Care | Attending: Orthopedic Surgery

## 2019-11-22 ENCOUNTER — Encounter (HOSPITAL_COMMUNITY): Payer: Self-pay | Admitting: Orthopedic Surgery

## 2019-11-22 ENCOUNTER — Ambulatory Visit (HOSPITAL_COMMUNITY): Payer: Medicaid Other | Admitting: Physician Assistant

## 2019-11-22 ENCOUNTER — Ambulatory Visit (HOSPITAL_COMMUNITY)
Admission: RE | Admit: 2019-11-22 | Discharge: 2019-11-22 | Disposition: A | Discharge status: Home / Self Care | Payer: Medicaid Other | Attending: Orthopedic Surgery | Admitting: Orthopedic Surgery

## 2019-11-22 DIAGNOSIS — G5601 Carpal tunnel syndrome, right upper limb: Secondary | ICD-10-CM | POA: Diagnosis not present

## 2019-11-22 DIAGNOSIS — Z888 Allergy status to other drugs, medicaments and biological substances status: Secondary | ICD-10-CM | POA: Insufficient documentation

## 2019-11-22 HISTORY — PX: CARPAL TUNNEL RELEASE: SHX101

## 2019-11-22 LAB — GLUCOSE, CAPILLARY
Glucose-Capillary: 159 mg/dL — ABNORMAL HIGH (ref 70–99)
Glucose-Capillary: 161 mg/dL — ABNORMAL HIGH (ref 70–99)

## 2019-11-22 SURGERY — CARPAL TUNNEL RELEASE
Anesthesia: General | Laterality: Right

## 2019-11-22 MED ORDER — HYDROCODONE-ACETAMINOPHEN 5-325 MG PO TABS: 1.0000 | ORAL_TABLET | ORAL | 0 refills | Status: DC | PRN

## 2019-11-22 MED ORDER — FENTANYL CITRATE (PF) 100 MCG/2ML IJ SOLN
25.0000 ug | INTRAMUSCULAR | Status: DC | PRN
  Administered 2019-11-22 (×2): 25 ug via INTRAVENOUS
  Administered 2019-11-22: 50 ug via INTRAVENOUS

## 2019-11-22 MED ORDER — ONDANSETRON HCL 4 MG/2ML IJ SOLN
INTRAMUSCULAR | Status: AC
  Filled 2019-11-22: qty 2

## 2019-11-22 MED ORDER — MIDAZOLAM HCL 5 MG/5ML IJ SOLN
INTRAMUSCULAR | Status: DC | PRN
  Administered 2019-11-22 (×2): 1 mg via INTRAVENOUS

## 2019-11-22 MED ORDER — ACETAMINOPHEN 160 MG/5ML PO SOLN: 325.0000 mg | Freq: Once | ORAL | Status: DC | PRN

## 2019-11-22 MED ORDER — CEFAZOLIN SODIUM-DEXTROSE 1-4 GM/50ML-% IV SOLN: 1.0000 g | Freq: Once | INTRAVENOUS | Status: DC

## 2019-11-22 MED ORDER — FENTANYL CITRATE (PF) 100 MCG/2ML IJ SOLN
INTRAMUSCULAR | Status: AC
  Filled 2019-11-22: qty 2

## 2019-11-22 MED ORDER — 0.9 % SODIUM CHLORIDE (POUR BTL) OPTIME
TOPICAL | Status: DC | PRN
  Administered 2019-11-22: 1000 mL

## 2019-11-22 MED ORDER — LACTATED RINGERS IV SOLN: INTRAVENOUS | Status: DC

## 2019-11-22 MED ORDER — BUPIVACAINE HCL 0.25 % IJ SOLN
INTRAMUSCULAR | Status: AC
  Filled 2019-11-22: qty 1

## 2019-11-22 MED ORDER — DEXAMETHASONE SODIUM PHOSPHATE 10 MG/ML IJ SOLN
INTRAMUSCULAR | Status: AC
  Filled 2019-11-22: qty 1

## 2019-11-22 MED ORDER — PROPOFOL 10 MG/ML IV BOLUS
INTRAVENOUS | Status: DC | PRN
  Administered 2019-11-22 (×2): 50 mg via INTRAVENOUS
  Administered 2019-11-22: 300 mg via INTRAVENOUS

## 2019-11-22 MED ORDER — BUPIVACAINE HCL (PF) 0.25 % IJ SOLN
INTRAMUSCULAR | Status: DC | PRN
  Administered 2019-11-22: 5 mL

## 2019-11-22 MED ORDER — ONDANSETRON HCL 4 MG/2ML IJ SOLN
INTRAMUSCULAR | Status: DC | PRN
  Administered 2019-11-22: 4 mg via INTRAVENOUS

## 2019-11-22 MED ORDER — ORAL CARE MOUTH RINSE
15.0000 mL | Freq: Once | OROMUCOSAL | Status: AC
  Administered 2019-11-22: 15 mL via OROMUCOSAL

## 2019-11-22 MED ORDER — LIDOCAINE HCL 2 % IJ SOLN
INTRAMUSCULAR | Status: AC
  Filled 2019-11-22: qty 20

## 2019-11-22 MED ORDER — PROPOFOL 10 MG/ML IV BOLUS
INTRAVENOUS | Status: AC
  Filled 2019-11-22: qty 20

## 2019-11-22 MED ORDER — SUCCINYLCHOLINE CHLORIDE 20 MG/ML IJ SOLN
INTRAMUSCULAR | Status: DC | PRN
  Administered 2019-11-22: 60 mg via INTRAVENOUS

## 2019-11-22 MED ORDER — ACETAMINOPHEN 10 MG/ML IV SOLN: 1000.0000 mg | Freq: Once | INTRAVENOUS | Status: DC | PRN

## 2019-11-22 MED ORDER — DEXAMETHASONE SODIUM PHOSPHATE 10 MG/ML IJ SOLN
INTRAMUSCULAR | Status: DC | PRN
  Administered 2019-11-22: 10 mg via INTRAVENOUS

## 2019-11-22 MED ORDER — CHLORHEXIDINE GLUCONATE 0.12 % MT SOLN: 15.0000 mL | Freq: Once | OROMUCOSAL | Status: AC

## 2019-11-22 MED ORDER — DEXTROSE 5 % IV SOLN
3.0000 g | INTRAVENOUS | Status: AC
  Administered 2019-11-22: 3 g via INTRAVENOUS
  Filled 2019-11-22: qty 3

## 2019-11-22 MED ORDER — ACETAMINOPHEN 325 MG PO TABS: 325.0000 mg | ORAL_TABLET | Freq: Once | ORAL | Status: DC | PRN

## 2019-11-22 MED ORDER — LIDOCAINE 2% (20 MG/ML) 5 ML SYRINGE
INTRAMUSCULAR | Status: AC
  Filled 2019-11-22: qty 5

## 2019-11-22 MED ORDER — LIDOCAINE 2% (20 MG/ML) 5 ML SYRINGE
INTRAMUSCULAR | Status: DC | PRN
  Administered 2019-11-22: 100 mg via INTRAVENOUS

## 2019-11-22 MED ORDER — MEPERIDINE HCL 50 MG/ML IJ SOLN: 6.2500 mg | INTRAMUSCULAR | Status: DC | PRN

## 2019-11-22 MED ORDER — BUPIVACAINE HCL (PF) 0.5 % IJ SOLN
INTRAMUSCULAR | Status: AC
  Filled 2019-11-22: qty 30

## 2019-11-22 MED ORDER — AMISULPRIDE (ANTIEMETIC) 5 MG/2ML IV SOLN: 10.0000 mg | Freq: Once | INTRAVENOUS | Status: DC | PRN

## 2019-11-22 MED ORDER — MIDAZOLAM HCL 2 MG/2ML IJ SOLN
INTRAMUSCULAR | Status: AC
  Filled 2019-11-22: qty 2

## 2019-11-22 MED ORDER — FENTANYL CITRATE (PF) 100 MCG/2ML IJ SOLN
INTRAMUSCULAR | Status: DC | PRN
  Administered 2019-11-22 (×4): 25 ug via INTRAVENOUS

## 2019-11-22 SURGICAL SUPPLY — 40 items
APL PRP STRL LF DISP 70% ISPRP (MISCELLANEOUS) ×2
BLADE SURG 15 STRL LF DISP TIS (BLADE) ×1 IMPLANT
BLADE SURG 15 STRL SS (BLADE) ×2
BNDG CMPR 9X4 STRL LF SNTH (GAUZE/BANDAGES/DRESSINGS)
BNDG COHESIVE 2X5 TAN STRL LF (GAUZE/BANDAGES/DRESSINGS) ×2 IMPLANT
BNDG CONFORM 2 STRL LF (GAUZE/BANDAGES/DRESSINGS) ×2 IMPLANT
BNDG ESMARK 4X9 LF (GAUZE/BANDAGES/DRESSINGS) IMPLANT
CHLORAPREP W/TINT 26 (MISCELLANEOUS) ×3 IMPLANT
CORD BIPOLAR FORCEPS 12FT (ELECTRODE) ×2 IMPLANT
COVER BACK TABLE 60X90IN (DRAPES) ×2 IMPLANT
COVER WAND RF STERILE (DRAPES) IMPLANT
DECANTER SPIKE VIAL GLASS SM (MISCELLANEOUS) IMPLANT
DRAPE EXTREMITY T 121X128X90 (DISPOSABLE) ×2 IMPLANT
DRAPE IMP U-DRAPE 54X76 (DRAPES) ×2 IMPLANT
DRAPE SURG 17X23 STRL (DRAPES) ×2 IMPLANT
DRSG EMULSION OIL 3X3 NADH (GAUZE/BANDAGES/DRESSINGS) ×2 IMPLANT
GAUZE PACKING 2X5 YD STRL (GAUZE/BANDAGES/DRESSINGS) ×1 IMPLANT
GAUZE SPONGE 4X4 12PLY STRL (GAUZE/BANDAGES/DRESSINGS) ×2 IMPLANT
GLOVE BIO SURGEON STRL SZ7 (GLOVE) ×2 IMPLANT
GLOVE BIO SURGEON STRL SZ7.5 (GLOVE) ×2 IMPLANT
GLOVE BIOGEL PI IND STRL 7.0 (GLOVE) ×1 IMPLANT
GLOVE BIOGEL PI IND STRL 8 (GLOVE) ×1 IMPLANT
GLOVE BIOGEL PI INDICATOR 7.0 (GLOVE) ×1
GLOVE BIOGEL PI INDICATOR 8 (GLOVE) ×1
GOWN STRL REUS W/ TWL LRG LVL3 (GOWN DISPOSABLE) ×2 IMPLANT
GOWN STRL REUS W/ TWL XL LVL3 (GOWN DISPOSABLE) ×1 IMPLANT
GOWN STRL REUS W/TWL LRG LVL3 (GOWN DISPOSABLE) ×4
GOWN STRL REUS W/TWL XL LVL3 (GOWN DISPOSABLE) ×2
KIT BASIN OR (CUSTOM PROCEDURE TRAY) ×2 IMPLANT
NDL HYPO 25X1 1.5 SAFETY (NEEDLE) IMPLANT
NEEDLE HYPO 25X1 1.5 SAFETY (NEEDLE) IMPLANT
NS IRRIG 1000ML POUR BTL (IV SOLUTION) ×2 IMPLANT
PADDING CAST ABS 4INX4YD NS (CAST SUPPLIES) ×1
PADDING CAST ABS COTTON 4X4 ST (CAST SUPPLIES) ×1 IMPLANT
STOCKINETTE 4X48 STRL (DRAPES) ×2 IMPLANT
SUT ETHILON 4 0 PS 2 18 (SUTURE) ×2 IMPLANT
SYR BULB EAR ULCER 3OZ GRN STR (SYRINGE) ×2 IMPLANT
SYR CONTROL 10ML LL (SYRINGE) IMPLANT
TOWEL OR 17X26 10 PK STRL BLUE (TOWEL DISPOSABLE) ×2 IMPLANT
UNDERPAD 30X36 HEAVY ABSORB (UNDERPADS AND DIAPERS) ×2 IMPLANT

## 2019-11-22 NOTE — Transfer of Care (Signed)
Immediate Anesthesia Transfer of Care Note  Patient: Shallyn A Meland  Procedure(s) Performed: RIGHT HAND CARPAL TUNNEL RELEASE (Right )  Patient Location: PACU  Anesthesia Type:General  Level of Consciousness: awake, alert  and oriented  Airway & Oxygen Therapy: Patient Spontanous Breathing and Patient connected to face mask oxygen  Post-op Assessment: Report given to RN and Post -op Vital signs reviewed and stable  Post vital signs: Reviewed and stable  Last Vitals:  Vitals Value Taken Time  BP 155/139 11/22/19 1532  Temp    Pulse 103 11/22/19 1541  Resp 19 11/22/19 1541  SpO2 100 % 11/22/19 1541  Vitals shown include unvalidated device data.  Last Pain:  Vitals:   11/22/19 1128  TempSrc:   PainSc: 8          Complications: No complications documented.

## 2019-11-22 NOTE — H&P (Signed)
[name] is an 54 y.o. female.   Chief Complaint: R carpal tunnel syndrome  HPI: R carpal tunnel syndrome, failed conservative measures.  Past Medical History:  Diagnosis Date  . ALLERGIC RHINITIS    takes Zyrtec daily  . Anxiety   . Arthritis   . Asthma    Albuterol prn;Symbicort daily and Singulair daily  . Bronchitis    hx of  . CAP (community acquired pneumonia) 02/20/2015  . Carpal tunnel syndrome    right  . Chronic back pain   . Constipation    Miralax prn  . Depression    takes Cymbalta daily  . Diabetes ([place])    type 2   . Dizziness    pt states she gets off balance occasionally  . Eczema   . Gallstones 2010  . GERD (gastroesophageal reflux disease)    takes Omeprazole daily  . Headache(784.0)    couple of times a week  . Hemorrhoids   . Hypertension    takes Prinizide daily and Clonidine on Mondays  . Hypoventilation associated with obesity syndrome ([place])   . Insomnia   . Irritable bladder   . Joint pain   . Joint swelling   . Lung disease    scleraderma  . Morbid (severe) obesity due to excess calories ([place]) 04/10/2007   Restrictive changes on pfts 11/30/2007    . Myasthenia gravis 1994   Positive Acetylcholine receptor Ab and single fiber EMG Dr. [facility]- Dr. Rosiland Oz 1998, Dr Gaynell Face 2008 [facility]- Failed prednisone due to weight gain, stopped imuran/mestinon due to finances- Now with quiescent disease per Dr Gaynell Face and no flares in many years  . Myasthenia gravis   . Nocturia   . OSA (obstructive sleep apnea)    uses pillows   . Osteoarthritis   . Pelvic inflammatory disease (PID)   . Peptic ulcer   . Pneumonia    hx of;last time about 1-35yr ago  . 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 VChesley Mires 10/30/2007 ANA positive, ANA titer  negative, RF less than 20  . Rheumatoid arthritis(714.0)   . Sarcoidosis 12/23/2010   Derm  dx NCG  12/03/10 (path report in EPIC)    Overview:   Overview:  Diagnosed on skin biopsy November 2012  Last Assessment & Plan:  Labs are unremarkable. CXR improved/stable. PFT wnl. I am happy patient had these tests and we have a baseline for her. At this time, I do not see an indication for starting chronic steroids daily. Patient agrees. Her skin lesions are bothering her the most and syst  . Shortness of breath    can be sitting as well as exertion  . Skin irritation    skin itchy  . Trigger finger   . Urinary frequency    takes Ditropan daily  . Urinary urgency   . Viral meningitis    history of viral meningitis    Past Surgical History:  Procedure Laterality Date  . BRONCHOSCOPY  08/2007  . CARPAL TUNNEL RELEASE  05/20/2011   Procedure: CARPAL TUNNEL RELEASE;  Surgeon: JNita Sells MD;  [facility]  Service: Orthopedics;  Laterality: Right;  . CESAREAN SECTION  09/15/2004  . COLONOSCOPY N/A 01/21/2014   Procedure: COLONOSCOPY;  Surgeon: CGatha Mayer MD;  Location: [facility];  Service: Endoscopy;  Laterality: N/A;  . cortisone injection     receives an injection every 320month . ESOPHAGOGASTRODUODENOSCOPY    .  ESOPHAGOGASTRODUODENOSCOPY N/A 01/21/2014   Procedure: ESOPHAGOGASTRODUODENOSCOPY (EGD);  Surgeon: Gatha Mayer, MD;  Location: [facility];  Service: Endoscopy;  Laterality: N/A;  . LAPAROSCOPIC CHOLECYSTECTOMY  07/2008   by Dr. Neldon Mc  . Thymus resection  08/25/1993  . TRIGGER FINGER RELEASE  05/20/2011   Procedure: RELEASE TRIGGER FINGER/A-1 PULLEY;  Surgeon: Nita Sells, MD;  Location: [facility];  Service: Orthopedics;  Laterality: Right;  RIGHT TRIGGER THUMB RELEASE AND RIGHT CARPAL TUNNEL RELEASE    Family History  Problem Relation Age of Onset  . Hypertension Father   . Sickle cell trait Father   . Diabetes Father        Borderline  . Stroke Paternal Grandmother   . Sickle cell trait Paternal Grandfather   . Diabetes Paternal Grandfather   . Lupus Other         Mother's first cousin  . Crohn's disease Paternal Aunt   . Diabetes Maternal Grandmother   . Anesthesia problems Neg Hx   . Hypotension Neg Hx   . Malignant hyperthermia Neg Hx   . Pseudochol deficiency Neg Hx   . Colon cancer Neg Hx   . Colon polyps Neg Hx   . Heart disease Neg Hx   . Kidney disease Neg Hx   . Esophageal cancer Neg Hx   . Gallbladder disease Neg Hx    Social History:  reports that she has never smoked. She has never used smokeless tobacco. She reports that she does not drink alcohol and does not use drugs.  Allergies:  Allergies  Allergen Reactions  . Apple Other (See Comments)    "mouth itch" - lumps on tongue  . Banana Other (See Comments)    "whelps on tongue"  . Metformin And Related Nausea Only  . Shrimp [Shellfish Allergy] Nausea And Vomiting and Swelling    Medications Prior to Admission  Medication Sig Dispense Refill  . albuterol (PROAIR HFA) 108 (90 Base) MCG/ACT inhaler Inhale 2 puffs into the lungs every 4 (four) hours as needed. 8.5 g 12  . amLODipine (NORVASC) 5 MG tablet Take 1 tablet (5 mg total) by mouth at bedtime. 90 tablet 3  . buPROPion (WELLBUTRIN XL) 300 MG 24 hr tablet Take 1 tablet (300 mg total) by mouth daily. 90 tablet 2  . cetirizine (ZYRTEC) 10 MG tablet Take 1 tablet (10 mg total) by mouth daily. 90 tablet 3  . cloNIDine (CATAPRES - DOSED IN MG/24 HR) 0.3 mg/24hr patch Place 1 patch (0.3 mg total) onto the skin once a week. 4 patch 3  . cloNIDine (CATAPRES) 0.1 MG tablet PLEASE SEE ATTACHED FOR DETAILED DIRECTIONS (Patient taking differently: Take 0.1 mg by mouth daily as needed (in between patches). ) 60 tablet 1  . Dulaglutide (TRULICITY) 1.5 MN/8.1RR SOPN Inject 0.5 mLs (1.5 mg total) into the skin once a week. Take on Sunday 4 pen 5  . furosemide (LASIX) 20 MG tablet TAKE 1 TABLET (20 MG TOTAL) BY MOUTH DAILY AS NEEDED. (Patient taking differently: Take 20 mg by mouth daily. ) 30 tablet 0  . hydrochlorothiazide (HYDRODIURIL) 25  MG tablet Take 1 tablet (25 mg total) by mouth daily. 30 tablet 11  . insulin glargine (LANTUS SOLOSTAR) 100 UNIT/ML Solostar Pen Inject 120 Units into the skin at bedtime. 36 mL 2  . montelukast (SINGULAIR) 10 MG tablet TAKE 1 TABLET BY MOUTH EVERYDAY AT BEDTIME (Patient taking differently: Take 10 mg by mouth at bedtime. ) 30 tablet 11  . omeprazole (  PRILOSEC) 40 MG capsule Take 1 capsule (40 mg total) by mouth daily. 60 capsule 11  . oxybutynin (DITROPAN) 5 MG tablet TAKE 1 TABLET BY MOUTH TWICE A DAY (Patient taking differently: Take 10 mg by mouth daily. ) 60 tablet 5  . oxyCODONE (OXY IR/ROXICODONE) 5 MG immediate release tablet Take 1 tablet (5 mg total) by mouth every 6 (six) hours as needed for severe pain. 81 tablet 0  . pregabalin (LYRICA) 50 MG capsule TAKE 1 CAPSULE (50 MG TOTAL) BY MOUTH 2 (TWO) TIMES DAILY. 60 capsule 0  . ACCU-CHEK AVIVA PLUS test strip USE AS INSTRUCTED 50 strip 7  . Accu-Chek Softclix Lancets lancets USE AS INSTRUCTED 100 each 9  . acetaminophen (TYLENOL) 500 MG tablet Take 500 mg by mouth every 6 (six) hours as needed. (Patient not taking: Reported on 11/07/2019)    . Blood Glucose Monitoring Suppl (ACCU-CHEK AVIVA PLUS) w/Device KIT 1 each by Does not apply route 3 (three) times daily after meals. 1 kit 0  . budesonide-formoterol (SYMBICORT) 160-4.5 MCG/ACT inhaler Inhale 2 puffs into the lungs 2 (two) times daily. 6 g 11  . FLUoxetine (PROZAC) 20 MG tablet Take 1 tablet (20 mg total) by mouth daily for 7 days, THEN 1 tablet (20 mg total) every other day for 7 days. (Patient not taking: Reported on 11/07/2019) 12 tablet 0  . Insulin Pen Needle (PEN NEEDLES 3/16") 31G X 5 MM MISC Use with Lantus solostar 30 each 12  . polyethylene glycol (MIRALAX / GLYCOLAX) packet Take 17 g by mouth daily as needed for mild constipation. 14 each 5  . VOLTAREN 1 % GEL APPLY 2 G TOPICALLY 4 (FOUR) TIMES DAILY AS NEEDED (KNEE PAIN). (Patient not taking: Reported on 09/14/2018) 100 g 0     Results for orders placed or performed during the hospital encounter of 11/22/19 (from the past 48 hour(s))  Glucose, capillary     Status: Abnormal   Collection Time: 11/22/19 11:16 AM  Result Value Ref Range   Glucose-Capillary 159 (H) 70 - 99 mg/dL    Comment: Glucose reference range applies only to samples taken after fasting for at least 8 hours.   No results found.  Review of Systems  Blood pressure (!) 162/92, pulse (!) 104, temperature 99.6 F (37.6 C), temperature source Oral, resp. rate 18, height 5' 3"  (1.6 m), weight (!) 157.3 kg, SpO2 98 %. Physical Exam   Assessment/Plan R carpal tunnel syndrome, failed conservative measures Plan R carpal tunnel release Risks / benefits of surgery discussed Consent on chart  NPO for OR Preop antibiotics   Isabella Stalling, MD 11/22/2019, 1:54 PM

## 2019-11-22 NOTE — Discharge Instructions (Signed)
Discharge Instructions after Carpal Tunnel Release   You will have a light dressing on your hand.  You may begin gentle motion of your fingers and hand immediately, but you should not do any heavy lifting or gripping.  Elevate your hand for the first 48 hours after surgery. Pain medicine has been prescribed for you.  Use your medicine as needed over the first 48 hours, and then you can begin to taper your use. You may take Extra Strength Tylenol or Tylenol only in place of the pain pills.  Leave the dressing in place until the third day after your surgery and then remove it and place a band-aid over the stitches.  After the bandage has been removed you may shower, but do not soak the incision.  You may drive a car when you are off of prescription pain medications and can safely control your vehicle with both hands.   Please call [phone] during normal business hours or [phone] after hours for any problems. Including the following:  - excessive redness of the incisions - drainage for more than 4 days - fever of more than 101.5 F  *Please note that pain medications will not be refilled after hours or on weekends.   

## 2019-11-22 NOTE — Op Note (Signed)
Procedure(s): RIGHT HAND CARPAL TUNNEL RELEASE Procedure Note  [name] female 54 y.o. 11/22/2019   Preoperative diagnosis: Recurrent right carpal tunnel syndrome  Postoperative diagnosis: Same  Procedure(s) and Anesthesia Type:    * RIGHT HAND CARPAL TUNNEL RELEASE - General  Surgeon(s) and Role:    Jones Broom, MD - Primary   Indications:  54 y.o. female with right carpal tunnel syndrome which has recurred 8 years out from her carpal tunnel release.  She has EMG findings consistent.  Surgeon: Berline Lopes   Assistants: Damita Lack PA-C [facility] was present and scrubbed throughout the procedure and was essential in positioning, retraction, exposure, and closure)  Anesthesia: General LMA anesthesia     Procedure Detail  RIGHT HAND CARPAL TUNNEL RELEASE  Findings: There was scar tissue which was causing significant stenosis in the carpal tunnel.  Once released the nerve appeared healthy.  Estimated Blood Loss:  Minimal         Drains: none  Blood Given: none         Specimens: none        Complications:  * No complications entered in OR log *          Disposition: PACU - hemodynamically stable.         Condition: stable    Procedure: The patient was brought to the operating room and was placed supine on the operative table.  LMA anesthesia was used.  A nonsterile tourniquet was applied and the operative extremity was prepped and draped in the standard sterile fashion.  The [name] was exsanguinated using an Esmarch dressing and the tourniquet was elevated to 250 mm mercury.  A 3 cm incision was made in line with the web space between the third and fourth ray extending distally from the flexion crease of the wrist. Dissection was carried down through subcutaneous fat to the palmar fascia which was opened longitudinally in line with the incision.  The scar tissue was carefully dissected until a small rent was made over the underlying  nerve.  A Freer elevator was then inserted proximally and distally to protect the contents of the carpal canal while a 15 blade was used under direct visualization to open proximally and distally. The proximal and distal extents of the scar tissue were then carefully exposed and under direct visualization completely divided using tenotomy scissors. Great care was taken to protect the underlying nerve and distally the palmar arch. At the conclusion of the procedure the free edges of the scar tissue were widely separated.  The wound was copiously irrigated with normal saline and subsequently closed in one layer with 4-0 nylon in an interrupted fashion.  Sterile dressings were applied including Adaptic 4 x 4's Kling dressing and a lightly wrapped Coban dressing. The tourniquet was let down. The fingers were pink and warm.  The patient was then awakened transferred to a stretcher and taken to the recovery room in stable condition.  Postoperative plan: Patient will be discharged home today and will followup in 10 days for suture removal and wound check.

## 2019-11-22 NOTE — Anesthesia Procedure Notes (Signed)
Procedure Name: LMA Insertion Date/Time: 11/22/2019 2:48 PM Performed by: Florene Route, CRNA Patient Re-evaluated:Patient Re-evaluated prior to induction Oxygen Delivery Method: Circle system utilized Preoxygenation: Pre-oxygenation with 100% oxygen LMA: LMA with gastric port inserted LMA Size: 4.0 Number of attempts: 1 Placement Confirmation: positive ETCO2 and breath sounds checked- equal and bilateral Tube secured with: Tape Dental Injury: Teeth and Oropharynx as per pre-operative assessment

## 2019-11-23 ENCOUNTER — Encounter (HOSPITAL_COMMUNITY): Payer: Self-pay | Admitting: Orthopedic Surgery

## 2019-11-23 NOTE — Anesthesia Postprocedure Evaluation (Signed)
Anesthesia Post Note  Patient: [name]  Procedure(s) Performed: RIGHT HAND CARPAL TUNNEL RELEASE (Right )     Patient location during evaluation: PACU Anesthesia Type: General Level of consciousness: awake and alert Pain management: pain level controlled Vital Signs Assessment: post-procedure vital signs reviewed and stable Respiratory status: spontaneous breathing, nonlabored ventilation, respiratory function stable and patient connected to nasal cannula oxygen Cardiovascular status: blood pressure returned to baseline and stable Postop Assessment: no apparent nausea or vomiting Anesthetic complications: no Comments: Laryngospasm intraoperatively, propofol and succinylcholine administered with resolution.    No complications documented.  Last Vitals:  Vitals:   11/22/19 1615 11/22/19 1640  BP: (!) 147/89 (!) 156/102  Pulse: 96 92  Resp: 19 16  Temp: 36.8 C 36.7 C  SpO2: 96% 98%    Last Pain:  Vitals:   11/22/19 1640  TempSrc:   PainSc: 6                  Shelton Silvas

## 2019-12-07 ENCOUNTER — Other Ambulatory Visit: Payer: Self-pay | Admitting: Family Medicine

## 2020-01-21 ENCOUNTER — Other Ambulatory Visit: Payer: Self-pay | Admitting: Family Medicine

## 2020-01-21 DIAGNOSIS — G8929 Other chronic pain: Secondary | ICD-10-CM

## 2020-01-29 ENCOUNTER — Encounter: Payer: Self-pay | Admitting: Family Medicine

## 2020-01-29 ENCOUNTER — Other Ambulatory Visit: Payer: Self-pay | Admitting: Family Medicine

## 2020-01-29 DIAGNOSIS — G8929 Other chronic pain: Secondary | ICD-10-CM

## 2020-01-30 ENCOUNTER — Other Ambulatory Visit: Payer: Self-pay | Admitting: Family Medicine

## 2020-01-30 MED ORDER — OXYCODONE HCL 5 MG PO CAPS: 5.0000 mg | ORAL_CAPSULE | ORAL | 0 refills | Status: DC | PRN

## 2020-01-30 MED ORDER — CETIRIZINE HCL 10 MG PO TABS
10.0000 mg | ORAL_TABLET | Freq: Every day | ORAL | 3 refills | Status: DC
Start: 2020-01-30 — End: 2021-09-29

## 2020-01-30 NOTE — Progress Notes (Signed)
Called patient regarding pain medication request.  Patient has not been prescribed oxycodone since July 2021.  Patient reports with the cold weather, she has had increased pain in her bilateral knees.  Previously, patient was receiving #81/month.  I am glad that she has been able to wean herself off.  We discussed using this medication acutely, but would want to avoid long-term use if able.  Patient reports that she would like to discuss further and use until weather warms back up at least as she has been in significant pain.  I have schedule an appointment for patient to follow-up in clinic next week on 02/05/2020.  #20 oxycodone 5 mg sent to pharmacy.  [name], M.D.  5:07 PM 01/30/2020

## 2020-02-05 ENCOUNTER — Ambulatory Visit: Payer: Medicaid Other | Admitting: Family Medicine

## 2020-02-15 ENCOUNTER — Other Ambulatory Visit: Payer: Self-pay | Admitting: Family Medicine

## 2020-02-15 DIAGNOSIS — E0810 Diabetes mellitus due to underlying condition with ketoacidosis without coma: Secondary | ICD-10-CM

## 2020-02-19 ENCOUNTER — Telehealth (INDEPENDENT_AMBULATORY_CARE_PROVIDER_SITE_OTHER): Payer: Medicaid Other | Admitting: Family Medicine

## 2020-02-19 DIAGNOSIS — G8929 Other chronic pain: Secondary | ICD-10-CM | POA: Diagnosis not present

## 2020-02-19 DIAGNOSIS — M25562 Pain in left knee: Secondary | ICD-10-CM

## 2020-02-19 DIAGNOSIS — D869 Sarcoidosis, unspecified: Secondary | ICD-10-CM | POA: Diagnosis not present

## 2020-02-19 DIAGNOSIS — M25561 Pain in right knee: Secondary | ICD-10-CM | POA: Diagnosis not present

## 2020-02-19 MED ORDER — OMEPRAZOLE 40 MG PO CPDR: 40.0000 mg | DELAYED_RELEASE_CAPSULE | Freq: Every day | ORAL | 3 refills | Status: DC

## 2020-02-19 MED ORDER — OXYBUTYNIN CHLORIDE 5 MG PO TABS
5.0000 mg | ORAL_TABLET | Freq: Two times a day (BID) | ORAL | 5 refills | Status: DC
Start: 2020-02-19 — End: 2020-08-21

## 2020-02-19 MED ORDER — OXYCODONE HCL 5 MG PO CAPS: 5.0000 mg | ORAL_CAPSULE | Freq: Four times a day (QID) | ORAL | 0 refills | Status: DC | PRN

## 2020-02-19 NOTE — Progress Notes (Signed)
[facility] Telemedicine Visit  Patient consented to have virtual visit and was identified by name and date of birth. Method of visit: Telephone  Encounter participants: Patient: [name] - located at home Provider: [name] - located at [name]  Others (if applicable): none   Chief Complaint/HPI: Bilateral knee pain   Patient presenting today via telephone.  She was possibly seen in office but her transportation packet was limited.  I spoke to patient few weeks ago after she requested oxycodone for bilateral knee pain and requested to see me in the office.  Patient reports that she has bilateral knee pain as well as pain in her feet and hips.  She describes the pain as sharp, and is exacerbated by the cold.  She reports that the pain is so bad that it makes her anxiety flareup and makes her feel like she is hyperventilating.   She is concerned about worsening arthritis.  She is frustrated because she wants to get knee surgery but has been told that she is not a candidate due to her weight.  She has tried to lose weight but it is difficult to exercise given her knee pain.  A few months ago, I filled out paperwork for her to get a mobility scooter.  She uses this in the house but still uses a walker when going out of the house.  She reports that she has little cooperation from her kids to get the mobility chair into her car which is why she does not use it when she goes out.  ROS: per HPI  Pertinent PMHx: BMI of 61, OSA, OHS, hypertension, DJD of knees  Exam:  There were no vitals taken for this visit.  Respiratory: No respiratory distress.  Speaking full sentences.  Assessment/Plan:  Chronic pain of both knees Patient previously on chronic oxycodone for bilateral knee pain but this has not been filled since spring 2021.  Patient reports that she self tapered.  She has been seen in [facility] clinic as well as referred to CCM to help accomplish her goals obesity and  barriers which include depression.  We have discussed bariatric surgery in the past, but does not look like patient has followed up.  I would like patient to be seen again in person to rule out other causes of bilateral leg pain and for formal physical exam of lower extremities and lower back.  Patient and I agreed that we would not want to restart her chronic opioids, but given her acute pain, will send #20 -5mg  oxycodone to the pharmacy to take as needed.  At next visit: - thorough physical exam of lower extremities  - Suggest healthy weight and wellness and provide information  - referral to bariatric surgery if patient agreeable  - please make sure that patient has followed up/plans to follow up with ortho surgery to re-evaluate her candidacy     Time spent during visit with patient: 21 minutes  , M.D.  1:52 PM 02/21/2020

## 2020-02-21 ENCOUNTER — Encounter: Payer: Self-pay | Admitting: Family Medicine

## 2020-02-21 NOTE — Assessment & Plan Note (Signed)
Patient previously on chronic oxycodone for bilateral knee pain but this has not been filled since spring 2021.  Patient reports that she self tapered.  She has been seen in [facility] clinic as well as referred to CCM to help accomplish her goals obesity and barriers which include depression.  We have discussed bariatric surgery in the past, but does not look like patient has followed up.  I would like patient to be seen again in person to rule out other causes of bilateral leg pain and for formal physical exam of lower extremities and lower back.  Patient and I agreed that we would not want to restart her chronic opioids, but given her acute pain, will send #20 -5mg  oxycodone to the pharmacy to take as needed.  At next visit: - thorough physical exam of lower extremities  - Suggest healthy weight and wellness and provide information  - referral to bariatric surgery if patient agreeable  - please make sure that patient has followed up/plans to follow up with ortho surgery to re-evaluate her candidacy

## 2020-02-24 NOTE — Progress Notes (Deleted)
   Subjective:   Patient ID: [name]    DOB: April 12, 1965, 55 y.o. female   MRN: [id number]  [name] is a 55 y.o. female with a history of HTN, allergic rhinitis, OSA, postinflammatory pulmonary fibrosis, GERD, diabetes, carpal tunnel syndrome of right wrist, myasthenia, DJD, depression, G1DD, insomnia, obesity here for bilateral knee pain  Chronic pain of both knees: Patient evaluated via telemedicine on 02/21/20 by Dr. Selena Batten for chornic knee pain. She was treated in past with opioids but self tapered. She was given short course of PRN Oxycodone 5mg  for acute pain until evaluation today.  She also endorses pain in her feet and hips. She describes the pain as sharp and worse in the cold.  Acute trauma? History of receiving intraarticular steroids? Plan to follow up with healthy weight clinic for weight management as she is aware her obesity is contributing  Prior Imaging: Right Knee XR (11/08/17): Advanced tricompartmental osteoarthritis, worse than the comparison study of 2015. No acute bony abnormality identified.  Left Knee XR (08/03/13): Advanced osteoarthritic changes LEFT knee progressive since 2012.  Review of Systems:  Per HPI.   Objective:   There were no vitals taken for this visit. Vitals and nursing note reviewed.  General: pleasant ***, sitting comfortably in exam chair, well nourished, well developed, in no acute distress with non-toxic appearance HEENT: normocephalic, atraumatic, moist mucous membranes, oropharynx clear without erythema or exudate, TM normal bilaterally  Neck: supple, non-tender without lymphadenopathy CV: regular rate and rhythm without murmurs, rubs, or gallops, no lower extremity edema, 2+ radial and pedal pulses bilaterally Lungs: clear to auscultation bilaterally with normal work of breathing on room air Resp: breathing comfortably on room air, speaking in full sentences Abdomen: soft, non-tender, non-distended, no masses or organomegaly  palpable, normoactive bowel sounds Skin: warm, dry, no rashes or lesions Extremities: warm and well perfused, normal tone MSK: ROM grossly intact, strength intact, gait normal Neuro: Alert and oriented, speech normal  Left Knee: - Inspection: no gross deformity. No swelling/effusion, erythema or bruising. Skin intact - Palpation: no TTP - ROM: full active ROM with flexion and extension in knee and hip - Strength: 5/5 strength - Neuro/vasc: NV intact - Special Tests: - LIGAMENTS: negative anterior and posterior drawer, negative Lachman's, no MCL or LCL laxity  -- MENISCUS: negative McMurray's, negative Thessaly  -- PF JOINT: nml patellar mobility bilaterally.  negative patellar grind, negative patellar apprehension  Right Knee: - Inspection: no gross deformity. No swelling/effusion, erythema or bruising. Skin intact - Palpation: no TTP - ROM: full active ROM with flexion and extension in knee and hip - Strength: 5/5 strength - Neuro/vasc: NV intact - Special Tests: - LIGAMENTS: negative anterior and posterior drawer, negative Lachman's, no MCL or LCL laxity  -- MENISCUS: negative McMurray's, negative Thessaly  -- PF JOINT: nml patellar mobility bilaterally.  negative patellar grind, negative patellar apprehension  Hips: normal ROM, negative FABER and FADIR bilaterally   Assessment & Plan:   No problem-specific Assessment & Plan notes found for this encounter.  No orders of the defined types were placed in this encounter.  No orders of the defined types were placed in this encounter.  Patient agreeable to referral for bariatric surgery*** Encouraged follow up with ortho for reevaluation Hip imaging?  2013, DO PGY-3, [facility] 02/24/2020 11:44 AM

## 2020-02-25 ENCOUNTER — Ambulatory Visit: Payer: Medicaid Other | Admitting: Family Medicine

## 2020-03-04 ENCOUNTER — Ambulatory Visit: Payer: Medicaid Other | Admitting: Family Medicine

## 2020-03-05 ENCOUNTER — Other Ambulatory Visit: Payer: Self-pay | Admitting: Family Medicine

## 2020-03-05 DIAGNOSIS — G8929 Other chronic pain: Secondary | ICD-10-CM

## 2020-03-11 ENCOUNTER — Telehealth: Payer: Self-pay | Admitting: Family Medicine

## 2020-03-11 MED ORDER — CLONIDINE 0.3 MG/24HR TD PTWK: 0.3000 mg | MEDICATED_PATCH | TRANSDERMAL | 3 refills | Status: DC

## 2020-03-11 NOTE — Telephone Encounter (Signed)
Clonidine patch refilled as Dr. Mauri Reading not enrolled currently.  Terisa Starr, MD  Family Medicine Teaching Service

## 2020-03-16 NOTE — Progress Notes (Signed)
Subjective:   Patient ID: [name]    DOB: 1965-05-27, 55 y.o. female   MRN: [id number]  Yajayra A Nivens is a 55 y.o. female with a history of HTN, allergic rhinitis, OSA, postinflammatory pulmonary fibrosis, GERD, diabetes, carpal tunnel syndrome of right wrist, myasthenia, DJD, depression, G1DD, insomnia, obesity here for bilateral knee pain  Chronic pain of both knees: Patient evaluated via telemedicine on 02/21/20 by Dr. Selena Batten for chornic knee pain. She was treated in past with opioids but self tapered. She was given short course of PRN Oxycodone 5mg  for acute pain until evaluation today.  She notes that the pain has been gradually worsening and become so bad that it is very difficult to walk.  She is seeing Ortho in the past and was told definitive treatment was bilateral knee replacements.  She was told that she was incapable of this treatment until weight loss is achieved.  She denies any acute trauma.  She is used ICS over the last 20 years but has stopped achieving any benefit from these.  She treats her pain with oxycodone 5 mg daily.  Sometimes she has to take 10 mg if the pain is severe.  She often waits until the pain brings her to tears before she takes medicine.  She treats breakthrough pain with Tylenol 1000 mg and ibuprofen 400 mg, alternating between these.  She is open to bariatric surgery and referral to weight management.  Review of Systems:  Per HPI.   Objective:   BP (!) 159/109   Pulse (!) 104   Wt (!) 350 lb 9.6 oz (159 kg)   SpO2 96%   BMI 62.11 kg/m  Vitals and nursing note reviewed.  General: pleasant older obese woman, appears uncomfortable and tired, well nourished, well developed, in no acute distress with non-toxic appearance Resp: breathing comfortably on room air, speaking in full sentences Skin: warm, dry MSK: gait significantly antalgic, difficult to raise from chair or start to walk, uses tall rolling walker at baseline, slow ambulation Neuro:  Alert and oriented, speech normal  Left Knee: - Inspection: no gross deformity. No swelling/effusion, erythema or bruising. Skin intact - Palpation: TTP along medial aspect of joint - ROM: Decreased ROM due to pain - Strength: 5/5 strength - Special Tests: negative anterior and posterior drawer, no MCL or LCL laxity   Right Knee: - Inspection: no gross deformity. No swelling/effusion, erythema or bruising. Skin intact - Palpation: TTP along medial aspect of joint - ROM: Decreased ROM due to pain, unable to extend knee beyond 45 degrees - Strength: 5/5 strength - Special Tests: negative anterior and posterior drawer, no MCL or LCL laxity   Prior Imaging: Right Knee XR (11/08/17): Advanced tricompartmental osteoarthritis, worse than the comparison study of 2015. No acute bony abnormality identified.  Left Knee XR (08/03/13): Advanced osteoarthritic changes LEFT knee progressive since 2012.  Assessment & Plan:   Essential (primary) hypertension Chronic, uncontrolled. Initial BP 190/109, improved to 159/109 after rest in room.  Per chart review, long history of resistance. Suspect pain is contributing.  Currently takes Amlodipine 5mg  QD, HCTZ 25mg  QD, and Clonidine 0.3mg   This was not able to be fully addressed given time constraints. BP improved on recheck. BP follow up scheduled in 2 weeks to monitor and adjust medications as needed. Will try to improve pain in the mean time. Patient to monitor BP daily, keep a log, and bring to appointment.    [facility]) Patient notes one of her Trulicity  pens malfunctioned and was unable to take her dose this week. One box of samples provided as detailed below. Refills provided.  Medication Samples have been provided to the patient.  Drug name: Trulicity       Strength: 1.5mg        Qty: 1  LOT: [id number]  Exp.Date: 11/22/20  Dosing instructions: Inject 1.5mg  once a week.  The patient has been instructed regarding the correct time, dose, and  frequency of taking this medication, including desired effects and most common side effects.   Chronic pain of both knees Chronic. Gradually worsening pain affecting ADL's/IADL's. History and physical most consistent with known severe arthritis of bilateral knees. Physical exam not remarkable for ligament or miniscual involvement. Lack of trauma is reassuring for fracture. No signs of septic joint or gout flare on exam .Do not feel repeat imaging is warranted at this time. Treatment options are very limited due to weight. Long history if ICS use with minimal improvement now. Pain significantly interferes with ADL's. Patient uses rolling walker at baseline to ambulate. Patient is open to referral to Weight Management Clinic and Bariatric surgery (referrals placed). Notes pain control with 5-10mg  Oxycodone QD depending on activities planned for the day. Discussed that long term use of opioids are not ideal however use until more definitive treatment can be arranged may be necessary. Will refer to Pain Management Clinic to allow for better pain control regimen to be established and followed. Recommended follow up with ortho as well to discuss management options.  - Oxycodone 5mg  q6 hours PRN for severe pain. #60. RF 0.  - Tylenol and Ibuprofen for breakthrough pain - Referral to bariatric surgery and Weight Management clinic placed - Referral to pain management clinic placed - Recommended to follow up with Ortho for further discussion.  - PDMP reviewed and no red flags  BMI 60.0-69.9, adult [facility]) Referral to bariatric surgery and Weight management clinic placed today. Patient to await call and inform [facility] if no contact has been made in 2-3 weeks. She voiced understanding and agreement with plan.  Orders Placed This Encounter  Procedures  . Ambulatory referral to Pain Clinic    Referral Priority:   Routine    Referral Type:   Consultation    Referral Reason:   Specialty Services Required    Requested  Specialty:   Pain Medicine    Number of Visits Requested:   1  . Amb Ref to Medical Weight Management    Referral Priority:   Routine    Referral Type:   Consultation    Number of Visits Requested:   1  . Amb Referral to Bariatric Surgery    Referral Priority:   Routine    Referral Type:   Consultation    Number of Visits Requested:   1   Meds ordered this encounter  Medications  . oxycodone (OXY-IR) 5 MG capsule    Sig: Take 1 capsule (5 mg total) by mouth every 6 (six) hours as needed.    Dispense:  60 capsule    Refill:  0  . Dulaglutide (TRULICITY) 1.5 MG/0.5ML SOPN    Sig: Inject 1.5 mg into the skin once a week. Take on Sunday    Dispense:  0.5 mL    Refill:  5  . Insulin Pen Needle (PEN NEEDLES 3/16") 31G X 5 MM MISC    Sig: Use with Lantus solostar    Dispense:  30 each    Refill:  12  Orpah Cobb, DO PGY-3, [facility] 03/17/2020 5:12 PM

## 2020-03-17 ENCOUNTER — Encounter: Payer: Self-pay | Admitting: Family Medicine

## 2020-03-17 ENCOUNTER — Other Ambulatory Visit: Payer: Self-pay

## 2020-03-17 ENCOUNTER — Ambulatory Visit (INDEPENDENT_AMBULATORY_CARE_PROVIDER_SITE_OTHER): Payer: Medicaid Other | Admitting: Family Medicine

## 2020-03-17 VITALS — BP 159/109 | HR 104 | Wt 350.6 lb

## 2020-03-17 DIAGNOSIS — M174 Other bilateral secondary osteoarthritis of knee: Secondary | ICD-10-CM

## 2020-03-17 DIAGNOSIS — E0810 Diabetes mellitus due to underlying condition with ketoacidosis without coma: Secondary | ICD-10-CM

## 2020-03-17 DIAGNOSIS — G8929 Other chronic pain: Secondary | ICD-10-CM

## 2020-03-17 DIAGNOSIS — M25561 Pain in right knee: Secondary | ICD-10-CM | POA: Diagnosis present

## 2020-03-17 DIAGNOSIS — I10 Essential (primary) hypertension: Secondary | ICD-10-CM

## 2020-03-17 DIAGNOSIS — Z6841 Body Mass Index (BMI) 40.0 and over, adult: Secondary | ICD-10-CM | POA: Diagnosis not present

## 2020-03-17 DIAGNOSIS — M25562 Pain in left knee: Secondary | ICD-10-CM | POA: Diagnosis not present

## 2020-03-17 MED ORDER — INSULIN PEN NEEDLE 31G X 5 MM MISC: 9 refills | Status: DC

## 2020-03-17 MED ORDER — TRULICITY 1.5 MG/0.5ML ~~LOC~~ SOAJ: 1.5000 mg | SUBCUTANEOUS | 5 refills | Status: DC

## 2020-03-17 MED ORDER — OXYCODONE HCL 5 MG PO CAPS: 5.0000 mg | ORAL_CAPSULE | Freq: Four times a day (QID) | ORAL | 0 refills | Status: DC | PRN

## 2020-03-17 NOTE — Assessment & Plan Note (Signed)
Referral to bariatric surgery and Weight management clinic placed today. Patient to await call and inform [facility] if no contact has been made in 2-3 weeks. She voiced understanding and agreement with plan.

## 2020-03-17 NOTE — Patient Instructions (Addendum)
It was a pleasure to see you today!  Thank you for choosing [facility] for your primary care.   Our plans for today were:  I am so sorry you are in such pain.  I have referred you to Chronic pain management  I have also referred you to Weight Management clinic and Bariatric surgery to help get weight off to help improve your pain and ability to tget the surgery you need.  I do recommend contact ortho again to discuss alternative treatment options  I have prescribed you Oxycodone 5mg  that you can take up to twice a day. Please take Tylenol and Ibuprofen for break through pain.  Please check your blood pressure at least one a day and keep a log. Bring this log with you to your appointment. Follow up on 04/01/20 at 3:50pm.   Best Wishes,   06/01/20, DO

## 2020-03-17 NOTE — Assessment & Plan Note (Addendum)
Chronic. Gradually worsening pain affecting ADL's/IADL's. History and physical most consistent with known severe arthritis of bilateral knees. Physical exam not remarkable for ligament or miniscual involvement. Lack of trauma is reassuring for fracture. No signs of septic joint or gout flare on exam .Do not feel repeat imaging is warranted at this time. Treatment options are very limited due to weight. Long history if ICS use with minimal improvement now. Pain significantly interferes with ADL's. Patient uses rolling walker at baseline to ambulate. Patient is open to referral to Weight Management Clinic and Bariatric surgery (referrals placed). Notes pain control with 5-10mg  Oxycodone QD depending on activities planned for the day. Discussed that long term use of opioids are not ideal however use until more definitive treatment can be arranged may be necessary. Will refer to Pain Management Clinic to allow for better pain control regimen to be established and followed. Recommended follow up with ortho as well to discuss management options.  - Oxycodone 5mg  q6 hours PRN for severe pain. #60. RF 0.  - Tylenol and Ibuprofen for breakthrough pain - Referral to bariatric surgery and Weight Management clinic placed - Referral to pain management clinic placed - Recommended to follow up with Ortho for further discussion.  - PDMP reviewed and no red flags

## 2020-03-17 NOTE — Assessment & Plan Note (Signed)
Patient notes one of her Trulicity pens malfunctioned and was unable to take her dose this week. One box of samples provided as detailed below. Refills provided.  Medication Samples have been provided to the patient.  Drug name: Trulicity       Strength: 1.5mg        Qty: 1  LOT: [id number]  Exp.Date: 11/22/20  Dosing instructions: Inject 1.5mg  once a week.  The patient has been instructed regarding the correct time, dose, and frequency of taking this medication, including desired effects and most common side effects.

## 2020-03-17 NOTE — Assessment & Plan Note (Addendum)
Chronic, uncontrolled. Initial BP 190/109, improved to 159/109 after rest in room.  Per chart review, long history of resistance. Suspect pain is contributing.  Currently takes Amlodipine 5mg  QD, HCTZ 25mg  QD, and Clonidine 0.3mg   This was not able to be fully addressed given time constraints. BP improved on recheck. BP follow up scheduled in 2 weeks to monitor and adjust medications as needed. Will try to improve pain in the mean time. Patient to monitor BP daily, keep a log, and bring to appointment.

## 2020-03-18 ENCOUNTER — Telehealth: Payer: Self-pay

## 2020-03-18 DIAGNOSIS — M174 Other bilateral secondary osteoarthritis of knee: Secondary | ICD-10-CM

## 2020-03-18 DIAGNOSIS — M25562 Pain in left knee: Secondary | ICD-10-CM

## 2020-03-18 DIAGNOSIS — G8929 Other chronic pain: Secondary | ICD-10-CM

## 2020-03-18 NOTE — Telephone Encounter (Signed)
Completed PA info in [facility] Tracks for Oxycodone 5mg . Status pending. Will recheck status in 24 hours.

## 2020-03-19 MED ORDER — OXYCODONE HCL 5 MG PO CAPS: 5.0000 mg | ORAL_CAPSULE | Freq: Four times a day (QID) | ORAL | 0 refills | Status: DC | PRN

## 2020-03-19 NOTE — Telephone Encounter (Signed)
Script signed, thanks. Burley Saver MD

## 2020-03-19 NOTE — Telephone Encounter (Signed)
Medication approved effective dates 03/18/2020 - 09/14/2020.  However the medication is still not going through at the pharmacy due to prescriber relationship with medicaid. I will need an attending to send this in. Will send to this mornings preceptor.

## 2020-03-21 ENCOUNTER — Encounter: Payer: Self-pay | Admitting: Physical Medicine and Rehabilitation

## 2020-04-01 ENCOUNTER — Ambulatory Visit: Payer: Medicaid Other | Admitting: Family Medicine

## 2020-04-09 ENCOUNTER — Ambulatory Visit: Payer: Medicaid Other | Admitting: Family Medicine

## 2020-04-09 ENCOUNTER — Other Ambulatory Visit: Payer: Self-pay

## 2020-04-09 ENCOUNTER — Encounter: Payer: Self-pay | Admitting: Family Medicine

## 2020-04-09 VITALS — BP 183/109 | HR 105 | Ht 64.0 in | Wt 357.6 lb

## 2020-04-09 DIAGNOSIS — Z794 Long term (current) use of insulin: Secondary | ICD-10-CM

## 2020-04-09 DIAGNOSIS — E1165 Type 2 diabetes mellitus with hyperglycemia: Secondary | ICD-10-CM | POA: Diagnosis present

## 2020-04-09 DIAGNOSIS — I10 Essential (primary) hypertension: Secondary | ICD-10-CM

## 2020-04-09 LAB — POCT GLYCOSYLATED HEMOGLOBIN (HGB A1C): HbA1c, POC (controlled diabetic range): 9.8 % — AB (ref 0.0–7.0)

## 2020-04-09 MED ORDER — ATORVASTATIN CALCIUM 20 MG PO TABS: 20.0000 mg | ORAL_TABLET | Freq: Every day | ORAL | 3 refills | Status: DC

## 2020-04-09 NOTE — Assessment & Plan Note (Signed)
Poorly controlled diabetes, A1c 9.8 today.  She does not member to take her Lantus, but usually remembers to take Trulicity.  Unclear why she is not on any other medications including an SGLT2 inhibitor.  She does reported inability to tolerate Metformin.  Again, would not make changes at this time as she is not currently taking the full regimen and would not want to give her hypoglycemia.  Spoke with Cheral Almas, patient will be seeing her soon.  She will help assist with her diabetes management.  CCM referral also placed.

## 2020-04-09 NOTE — Patient Instructions (Signed)
Thank you for coming to see me today. It was a pleasure. Today we talked about:   We will get some labs today.  If they are abnormal or we need to do something about them, I will call you.  If they are normal, I will send you a message on MyChart (if it is active) or a letter in the mail.  If you don't hear from [place] in 2 weeks, please call the office at the number below.  We are starting cholesterol medication to reduce your risk of stroke and heart attack with diabetes.  I have referred you to a nurse case manager for your diabetes.  I will also reach out to our pharmacy team to assist with your blood pressure and diabetes.    Please work on remembering to take your medications.  Please follow-up with me or PCP in 1 month.  If you have any questions or concerns, please do not hesitate to call the office at [phone].  Best,   Luis Abed, DO

## 2020-04-09 NOTE — Assessment & Plan Note (Addendum)
BP is not currently well controlled.  Likely in the setting of medication noncompliance.  It is also very unclear why patient is on the medications that she is currently on.  She is not on an ARB, although one point it was recommended but never initiated.  This would be a good option for her especially given her diabetes.  Patient forgets to take her medication.  She will try to work on this.  Will not make any changes given that she is not currently taking the medications that she is prescribed.  She is neurologically intact on examination.  No need for immediate intervention.  Continue with her current medications.  We will place CCM referral as well.  Discussed with Cheral Almas, patient was advised to make an appointment with her before leaving today.

## 2020-04-09 NOTE — Progress Notes (Signed)
SUBJECTIVE:   CHIEF COMPLAINT / HPI:   HTN Last seen on 03/17/2020, BP initially elevated to 190/109 and improved to 159/109 Current regimen: Amlodipine 5 mg daily, HCTZ 25 mg daily, clonidine 0.3 mg Appears that she was previously on lisinopril-hydrochlorothiazide in thousand 18 but this was discontinued due to COPD/asthma.  Losartan was recommended at that time, but was never initiated. She has previously seen the pharmacy team for discussion of this States that she has not been good about taking amlodipine or HCTZ, but always remembers her clonidine patch BPs at home SBP 126-167, DBP 67-90 Denies chest pain, shortness of breath, hypotension No headache or changes in vision at present Last BMP 11/16/2019, creatinine was 0.76 at that time Was using a walker, but now has a motorized wheelchair She walks around at home while holding onto things  T2DM Current regimen: Trulicity 1.5 mg every week, Lantus 110 units nightly States that she occasionally misses her Lantus Last A1c 9 on 11/16/2019 CBGs doesn't check Metformin is on allergy list due to nausea She was previously on Lipitor, but appears that this was discontinued in 2017, states she never took it because she didn't have high cholesterol Last lipid panel 11/27/2015, LDL 111 at that time   PERTINENT  PMH / PSH: HTN, OSA, pulmonary fibrosis, GERD, T2DM, myasthenia, DJD, depression, morbid obesity, HFpEF  OBJECTIVE:   BP (!) 183/109   Pulse (!) 105   Ht 5\' 4"  (1.626 m)   Wt (!) 357 lb 9.6 oz (162.2 kg)   SpO2 99%   BMI 61.38 kg/m    Physical Exam:  General: 55 y.o. female in NAD, sitting in wheelchair HEENT: NCAT, EOMI, PERRL Cardio: RRR  Lungs: CTAB, no wheezing, no rhonchi, no crackles, no IWOB on RA Skin: warm and dry Neuro: CN II-XII grossly intact   Results for orders placed or performed in visit on 04/09/20 (from the past 24 hour(s))  HgB A1c     Status: Abnormal   Collection Time: 04/09/20  3:05 PM   Result Value Ref Range   Hemoglobin A1C     HbA1c POC (<> result, manual entry)     HbA1c, POC (prediabetic range)     HbA1c, POC (controlled diabetic range) 9.8 (A) 0.0 - 7.0 %     ASSESSMENT/PLAN:   Essential (primary) hypertension BP is not currently well controlled.  Likely in the setting of medication noncompliance.  It is also very unclear why patient is on the medications that she is currently on.  She is not on an ARB, although one point it was recommended but never initiated.  This would be a good option for her especially given her diabetes.  Patient forgets to take her medication.  She will try to work on this.  Will not make any changes given that she is not currently taking the medications that she is prescribed.  She is neurologically intact on examination.  No need for immediate intervention.  Continue with her current medications.  We will place CCM referral as well.  Discussed with 04/11/20, patient was advised to make an appointment with her before leaving today.  Diabetes (HCC) Poorly controlled diabetes, A1c 9.8 today.  She does not member to take her Lantus, but usually remembers to take Trulicity.  Unclear why she is not on any other medications including an SGLT2 inhibitor.  She does reported inability to tolerate Metformin.  Again, would not make changes at this time as she is not currently taking  the full regimen and would not want to give her hypoglycemia.  Spoke with Cheral Almas, patient will be seeing her soon.  She will help assist with her diabetes management.  CCM referral also placed.     Unknown Jim, DO [facility]

## 2020-04-10 ENCOUNTER — Telehealth: Payer: Self-pay | Admitting: *Deleted

## 2020-04-10 ENCOUNTER — Other Ambulatory Visit: Payer: Self-pay | Admitting: Family Medicine

## 2020-04-10 DIAGNOSIS — Z794 Long term (current) use of insulin: Secondary | ICD-10-CM

## 2020-04-10 DIAGNOSIS — E1165 Type 2 diabetes mellitus with hyperglycemia: Secondary | ICD-10-CM

## 2020-04-10 DIAGNOSIS — I10 Essential (primary) hypertension: Secondary | ICD-10-CM

## 2020-04-10 LAB — BASIC METABOLIC PANEL
BUN/Creatinine Ratio: 11 (ref 9–23)
BUN: 8 mg/dL (ref 6–24)
CO2: 18 mmol/L — ABNORMAL LOW (ref 20–29)
Calcium: 9.3 mg/dL (ref 8.7–10.2)
Chloride: 102 mmol/L (ref 96–106)
Creatinine, Ser: 0.72 mg/dL (ref 0.57–1.00)
Glucose: 189 mg/dL — ABNORMAL HIGH (ref 65–99)
Potassium: 3.7 mmol/L (ref 3.5–5.2)
Sodium: 142 mmol/L (ref 134–144)
eGFR: 100 mL/min/{1.73_m2} (ref >59)

## 2020-04-10 LAB — LIPID PANEL
Chol/HDL Ratio: 5.1 ratio — ABNORMAL HIGH (ref 0.0–4.4)
Cholesterol, Total: 168 mg/dL (ref 100–199)
HDL: 33 mg/dL — ABNORMAL LOW (ref >39)
LDL Chol Calc (NIH): 111 mg/dL — ABNORMAL HIGH (ref 0–99)
Triglycerides: 130 mg/dL (ref 0–149)
VLDL Cholesterol Cal: 24 mg/dL (ref 5–40)

## 2020-04-10 NOTE — Chronic Care Management (AMB) (Signed)
  Care Management   Note  04/10/2020 Name: [name] MRN: [id number] DOB: 05-31-1965  [name] is a 55 y.o. year old female who is a primary care patient of [name], MD. I reached out to [employer] by phone today in response to a referral sent by [name]'s  PCP [name], MD  [name] was given information about care management services today including:  1. Care management services include personalized support from designated clinical staff supervised by her physician, including individualized plan of care and coordination with other care providers 2. 24/7 contact phone numbers for assistance for urgent and routine care needs. 3. The patient may stop care management services at any time by phone call to the office staff.  Patient agreed to services and verbal consent obtained.   Follow up plan: Telephone appointment with care management team member scheduled for:04/14/2020  Laquitha Heslin [facility] Guide, Embedded Care Coordination [facility] Management

## 2020-04-14 ENCOUNTER — Telehealth: Payer: Self-pay

## 2020-04-14 ENCOUNTER — Telehealth: Payer: Medicaid Other

## 2020-04-14 NOTE — Telephone Encounter (Signed)
  Care Management   Outreach Note  04/14/2020 Name: [name] MRN: [id number] DOB: [birth year]  Referred by: [name], MD Reason for referral : Chronic Care Management (Initial HTN)   An unsuccessful telephone outreach was attempted today. The patient was referred to the case management team for assistance with care management and care coordination.   Follow Up Plan: Telephone follow up appointment with care management team member scheduled for: 04/15/20  Juanell Fairly RN, BSN, [facility] Care Management Coordinator [facility] Phone: [phone] I Fax: [phone]

## 2020-04-15 ENCOUNTER — Ambulatory Visit: Payer: Medicaid Other

## 2020-04-16 ENCOUNTER — Ambulatory Visit: Payer: Medicaid Other | Admitting: Pharmacist

## 2020-04-16 NOTE — Chronic Care Management (AMB) (Signed)
Care Management    RN Visit Note  04/16/2020 Name: [name] MRN: [id number] DOB: [birth year]  Subjective: [name] is a 55 y.o. year old female who is a primary care patient of Wilber Oliphant, MD. The care management team was consulted for assistance with disease management and care coordination needs.    Engaged with patient by telephone for follow up visit in response to provider referral for case management and/or care coordination services.   Consent to Services:   Ms. Vavrek was given information about Care Management services today including:  1. Care Management services includes personalized support from designated clinical staff supervised by her physician, including individualized plan of care and coordination with other care providers 2. 24/7 contact phone numbers for assistance for urgent and routine care needs. 3. The patient may stop case management services at any time by phone call to the office staff.  Patient agreed to services and consent obtained.    Assessment: Patient is currently experiencing difficulty with remembering to take her medications and checking her vitals... See Care Plan below for interventions and patient self-care actives. Follow up Plan: Patient would like continued follow-up.  CCM RNCM will outreach the patient within the next 14 days.. Patient will call office if needed prior to next encounter  was performed as part of comprehensive evaluation and provision of chronic care management services.   SDOH (Social Determinants of Health) assessments and interventions performed:    Care Plan  Allergies  Allergen Reactions  . Apple Other (See Comments)    "mouth itch" - lumps on tongue  . Banana Other (See Comments)    "whelps on tongue"  . Metformin And Related Nausea Only  . Shrimp [Shellfish Allergy] Nausea And Vomiting and Swelling    Outpatient Encounter Medications as of 04/15/2020  Medication Sig Note  . ACCU-CHEK AVIVA  PLUS test strip USE AS INSTRUCTED   . Accu-Chek Softclix Lancets lancets USE AS INSTRUCTED   . acetaminophen (TYLENOL) 500 MG tablet Take 500 mg by mouth every 6 (six) hours as needed.   Marland Kitchen albuterol (PROAIR HFA) 108 (90 Base) MCG/ACT inhaler Inhale 2 puffs into the lungs every 4 (four) hours as needed.   Marland Kitchen amLODipine (NORVASC) 5 MG tablet Take 1 tablet (5 mg total) by mouth at bedtime.   . Blood Glucose Monitoring Suppl (ACCU-CHEK AVIVA PLUS) w/Device KIT 1 each by Does not apply route 3 (three) times daily after meals.   . budesonide-formoterol (SYMBICORT) 160-4.5 MCG/ACT inhaler Inhale 2 puffs into the lungs 2 (two) times daily.   Marland Kitchen buPROPion (WELLBUTRIN XL) 300 MG 24 hr tablet TAKE 1 TABLET BY MOUTH EVERY DAY   . cetirizine (ZYRTEC) 10 MG tablet Take 1 tablet (10 mg total) by mouth daily.   . cloNIDine (CATAPRES - DOSED IN MG/24 HR) 0.3 mg/24hr patch Place 1 patch (0.3 mg total) onto the skin once a week.   . cloNIDine (CATAPRES) 0.1 MG tablet PLEASE SEE ATTACHED FOR DETAILED DIRECTIONS (Patient taking differently: Take 0.1 mg by mouth daily as needed (in between patches).)   . Dulaglutide (TRULICITY) 1.5 XT/0.2IO SOPN Inject 1.5 mg into the skin once a week. Take on Sunday   . furosemide (LASIX) 20 MG tablet TAKE 1 TABLET (20 MG TOTAL) BY MOUTH DAILY AS NEEDED. (Patient taking differently: Take 20 mg by mouth daily.)   . hydrochlorothiazide (HYDRODIURIL) 25 MG tablet Take 1 tablet (25 mg total) by mouth daily.   . Insulin Pen Needle (  PEN NEEDLES 3/16") 31G X 5 MM MISC Use with Lantus solostar   . omeprazole (PRILOSEC) 40 MG capsule Take 1 capsule (40 mg total) by mouth daily.   Marland Kitchen oxybutynin (DITROPAN) 5 MG tablet Take 1 tablet (5 mg total) by mouth 2 (two) times daily.   Marland Kitchen oxycodone (OXY-IR) 5 MG capsule Take 1 capsule (5 mg total) by mouth every 6 (six) hours as needed.   . polyethylene glycol (MIRALAX / GLYCOLAX) packet Take 17 g by mouth daily as needed for mild constipation.   . pregabalin  (LYRICA) 50 MG capsule TAKE 1 CAPSULE BY MOUTH TWICE A DAY   . atorvastatin (LIPITOR) 20 MG tablet Take 1 tablet (20 mg total) by mouth daily. (Patient not taking: No sig reported) 04/15/2020: Patient has not started taking it yet  . FLUoxetine (PROZAC) 20 MG tablet Take 1 tablet (20 mg total) by mouth daily for 7 days, THEN 1 tablet (20 mg total) every other day for 7 days. (Patient not taking: Reported on 11/07/2019)   . insulin glargine (LANTUS SOLOSTAR) 100 UNIT/ML Solostar Pen Inject 120 Units into the skin at bedtime.   . montelukast (SINGULAIR) 10 MG tablet TAKE 1 TABLET BY MOUTH EVERYDAY AT BEDTIME (Patient taking differently: Take 10 mg by mouth at bedtime. )   . VOLTAREN 1 % GEL APPLY 2 G TOPICALLY 4 (FOUR) TIMES DAILY AS NEEDED (KNEE PAIN). (Patient not taking: No sig reported)    No facility-administered encounter medications on file as of 04/15/2020.    Patient Active Problem List   Diagnosis Date Noted  . Carpal tunnel syndrome of right wrist, RECURRENT 10/15/2019  . Insomnia, idiopathic 09/30/2018  . Grade I diastolic dysfunction 32/44/0102  . Encounter for chronic pain management 02/11/2014  . Diabetes ([place]) 02/11/2014  . Cystocele 09/14/2012  . Chronic pain of both knees 05/28/2010  . 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  . Allergic rhinitis 05/30/2008  . 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 Obstructive sleep apnea 10/30/2007  . BMI 60.0-69.9, adult ([place]) 04/10/2007  . MYASTHENIA 03/07/2007  . Depression, recurrent ([place]) 03/24/2006  . Essential (primary) hypertension 03/24/2006  . Gastro-esophageal reflux disease without esophagitis 03/24/2006    Conditions to be addressed/monitored: HTN and DMII  Care Plan : RN Case Manager  Updates made by [name], RN since 04/16/2020 12:00 AM  Problem: Glycemic and Hypertention  Management   Priority: High  Onset Date: 04/15/2020  Objective:   Lab Results  Component Value Date   HGBA1C 9.8 (A) 04/09/2020 .   Lab Results  Component Value Date   CREATININE 0.72 04/09/2020   CREATININE 0.76 11/16/2019   CREATININE 0.73 09/27/2018   BP Readings from Last 3 Encounters:  04/09/20 (!) 183/109  03/17/20 (!) 159/109  11/22/19 (!) 156/102     Current Barriers:  Marland Kitchen Knowledge Deficits related to basic Diabetes pathophysiology and self care/management . Knowledge Deficits related to basic understanding of hypertension pathophysiology and self-care management . Patient does not remember to take her medications  Case Manager Clinical Goal(s):  Marland Kitchen Over the next 90 days, patient will demonstrate improved adherence to prescribed treatment plan by taking all medications, for diabetes self care/management as evidenced by: lowering her a1c by 1-2 points and for hypertension as evidenced by taking all medications as prescribed, monitoring, and recording blood pressure as directed, adhering to low sodium/DASH diet   Interventions:  DM II . Provided education to patient about basic DM  disease process . Reviewed medications with patient and discussed importance of medication adherence and way to remember to take her medications . Discussed plans with patient for ongoing care management follow up and provided patient with direct contact information for care management team . Review of patient status, including review of consultants reports, relevant laboratory and other test results, and medications completed. . Will send patient Diabetes packet and EMMI via my chart . healthy lifestyle promoted  Anticipate A1C testing (point-of-care) every 3 to 6 months based on goal attainment.   Review mutually-set A1C goal or target range.   Anticipate use of antihyperglycemic with or without insulin and periodic adjustments; consider active involvement of pharmacist.   Compare self-reported symptoms of hypo or hyperglycemia to blood glucose levels,  diet and fluid intake, current medications, psychosocial and physiologic stressors, change in activity and barriers to care adherence.   Promote self-monitoring of blood glucose levels.   Assess and address barriers to management plan, such as food taking medications daily.  . modest weight loss (5 percent) promoted . blood glucose monitoring encouraged . blood glucose readings reviewed HTN Evaluation of current treatment plan related to hypertension self management and patient's adherence to plan as established by provider. . Provided education to patient re: stroke prevention, s/s of heart attack and stroke, DASH diet, complications of uncontrolled blood pressure . Reviewed medications with patient and discussed importance of compliance . Discussed plans with patient for ongoing care management follow up and provided patient with direct contact information for care management team . Advised patient, providing education and rationale, to monitor blood pressure daily and record, calling PCP for findings outside established parameters. Discussed with the patient the importance of checking her BP and what can happen to her body with elevated BP . Reviewed scheduled/upcoming provider appointments including: - . pain assessed and managed-pain level 8/10 for her knees and right hip . reduction of dietary sodium encouraged . medication adherence assessment completed-patient states that she forgets to take her medications as prescribed, discussed options for with her to help her remember . support and encouragement provided . Will send  A matter of choice blood pressure control booklet and EMMI via my chart.     Patient Goals/Self Care Activities:   Patient verbalizes understanding of plan Self-administers medications as prescribed  Calls pharmacy for medication refills  Call's provider office for new concerns or questions  Check blood sugar at prescribed times  Check blood sugar if I feel  it is too high or too low . Checks BP and records as discussed . Follows a low sodium diet/DASH diet       [name] RN, BSN, [place] Phone: [phone] I Fax: [phone]

## 2020-04-16 NOTE — Patient Instructions (Signed)
[name]  it was nice speaking with you. Please call me directly [phone] if you have questions about the goals we discussed.  Goals Addressed            This Visit's Progress   . Monitor and Manage My Blood Sugar-Diabetes Type 2       Timeframe:  Long-Range Goal Priority:  High Start Date:    04/15/20                         Expected End Date:    07/24/20                   Follow Up Date 04/28/20    - check blood sugar at prescribed times - take the blood sugar log to all doctor visits    Why is this important?    Checking your blood sugar at home helps to keep it from getting very high or very low.   Writing the results in a diary or log helps the doctor know how to care for you.   Your blood sugar log should have the time, date and the results.   Also, write down the amount of insulin or other medicine that you take.   Other information, like what you ate, exercise done and how you were feeling, will also be helpful.     Notes:     . Track and Manage My Blood Pressure-Hypertension       Timeframe:  Long-Range Goal Priority:  High Start Date:   04/15/20                          Expected End Date:  07/24/20                     Follow Up Date 04/28/20   - check blood pressure daily - choose a place to take my blood pressure (home, clinic or office, retail store) - write blood pressure results in a log or diary    Why is this important?    You won't feel high blood pressure, but it can still hurt your blood vessels.   High blood pressure can cause heart or kidney problems. It can also cause a stroke.   Making lifestyle changes like losing a little weight or eating less salt will help.   Checking your blood pressure at home and at different times of the day can help to control blood pressure.   If the doctor prescribes medicine remember to take it the way the doctor ordered.   Call the office if you cannot afford the medicine or if there are questions about it.      Notes:        Patient Care Plan: RN Case Manager  Problem Identified: Glycemic and Hypertention  Management   Priority: High  Onset Date: 04/15/2020  Objective:  Lab Results  Component Value Date   HGBA1C 9.8 (A) 04/09/2020 .   Lab Results  Component Value Date   CREATININE 0.72 04/09/2020   CREATININE 0.76 11/16/2019   CREATININE 0.73 09/27/2018   BP Readings from Last 3 Encounters:  04/09/20 (!) 183/109  03/17/20 (!) 159/109  11/22/19 (!) 156/102     Current Barriers:  Marland Kitchen Knowledge Deficits related to basic Diabetes pathophysiology and self care/management . Knowledge Deficits related to basic understanding of hypertension pathophysiology and self-care management . Patient does not remember to  take her medications  Case Manager Clinical Goal(s):  Marland Kitchen Over the next 90 days, patient will demonstrate improved adherence to prescribed treatment plan by taking all medications, for diabetes self care/management as evidenced by: lowering her a1c by 1-2 points and for hypertension as evidenced by taking all medications as prescribed, monitoring, and recording blood pressure as directed, adhering to low sodium/DASH diet   Interventions:  DM II . Provided education to patient about basic DM disease process . Reviewed medications with patient and discussed importance of medication adherence and way to remember to take her medications . Discussed plans with patient for ongoing care management follow up and provided patient with direct contact information for care management team . Review of patient status, including review of consultants reports, relevant laboratory and other test results, and medications completed. . Will send patient Diabetes packet and EMMI via my chart . healthy lifestyle promoted  Anticipate A1C testing (point-of-care) every 3 to 6 months based on goal attainment.   Review mutually-set A1C goal or target range.   Anticipate use of antihyperglycemic with  or without insulin and periodic adjustments; consider active involvement of pharmacist.   Compare self-reported symptoms of hypo or hyperglycemia to blood glucose levels, diet and fluid intake, current medications, psychosocial and physiologic stressors, change in activity and barriers to care adherence.   Promote self-monitoring of blood glucose levels.   Assess and address barriers to management plan, such as food taking medications daily.  . modest weight loss (5 percent) promoted . blood glucose monitoring encouraged . blood glucose readings reviewed HTN Evaluation of current treatment plan related to hypertension self management and patient's adherence to plan as established by provider. . Provided education to patient re: stroke prevention, s/s of heart attack and stroke, DASH diet, complications of uncontrolled blood pressure . Reviewed medications with patient and discussed importance of compliance . Discussed plans with patient for ongoing care management follow up and provided patient with direct contact information for care management team . Advised patient, providing education and rationale, to monitor blood pressure daily and record, calling PCP for findings outside established parameters. Discussed with the patient the importance of checking her BP and what can happen to her body with elevated BP . Reviewed scheduled/upcoming provider appointments including: - . pain assessed and managed-pain level 8/10 for her knees and right hip . reduction of dietary sodium encouraged . medication adherence assessment completed-patient states that she forgets to take her medications as prescribed, discussed options for with her to help her remember . support and encouragement provided . Will send  A matter of choice blood pressure control booklet and EMMI via my chart.     Patient Goals/Self Care Activities:   Patient verbalizes understanding of plan Self-administers medications as  prescribed  Calls pharmacy for medication refills  Call's provider office for new concerns or questions  Check blood sugar at prescribed times  Check blood sugar if I feel it is too high or too low . Checks BP and records as discussed . Follows a low sodium diet/DASH diet        [name] received Care Management services today:  1. Care Management services include personalized support from designated clinical staff supervised by her physician, including individualized plan of care and coordination with other care providers 2. 24/7 contact [phone] for assistance for urgent and routine care needs. 3. Care Management are voluntary services and be declined at any time by calling the office.  The patient verbalized understanding of  instructions provided today and declined a print copy of patient instruction materials.    Follow Up Plan: Patient would like continued follow-up.  CCM RNCM will outreach the patient within the next 14 days.. Patient will call office if needed prior to next encounter  Juanell Fairly, RN

## 2020-04-23 ENCOUNTER — Ambulatory Visit: Payer: Medicaid Other | Admitting: Pharmacist

## 2020-04-28 ENCOUNTER — Telehealth: Payer: Medicaid Other

## 2020-04-28 ENCOUNTER — Telehealth: Payer: Self-pay

## 2020-04-28 NOTE — Telephone Encounter (Signed)
  Care Management   Outreach Note  04/28/2020 Name: [name] MRN: [id number] DOB: Feb 01, 1965  Referred by: [name], MD Reason for referral : Chronic Care Management (DM II HTN)   An unsuccessful telephone outreach was attempted today. The patient was referred to the case management team for assistance with care management and care coordination.   Follow Up Plan: A HIPAA compliant phone message was left for the patient providing contact information and requesting a return call.  The care management team will reach out to the patient again over the next 7-14 days.  Telephone follow up appointment with care management team member scheduled for: 05/07/20 1:30 pm   Juanell Fairly RN, BSN, [facility] Care Management Coordinator [facility] Phone: [phone] I Fax: [phone]

## 2020-05-05 ENCOUNTER — Encounter: Payer: Self-pay | Admitting: Physical Medicine and Rehabilitation

## 2020-05-05 ENCOUNTER — Encounter
Payer: Medicaid Other | Attending: Physical Medicine and Rehabilitation | Admitting: Physical Medicine and Rehabilitation

## 2020-05-05 ENCOUNTER — Other Ambulatory Visit: Payer: Self-pay | Admitting: *Deleted

## 2020-05-05 ENCOUNTER — Other Ambulatory Visit: Payer: Self-pay

## 2020-05-05 VITALS — BP 143/101 | HR 109 | Temp 99.2°F | Ht 63.0 in | Wt 350.2 lb

## 2020-05-05 DIAGNOSIS — G894 Chronic pain syndrome: Secondary | ICD-10-CM | POA: Diagnosis present

## 2020-05-05 DIAGNOSIS — Z5181 Encounter for therapeutic drug level monitoring: Secondary | ICD-10-CM | POA: Diagnosis present

## 2020-05-05 DIAGNOSIS — M25561 Pain in right knee: Secondary | ICD-10-CM

## 2020-05-05 DIAGNOSIS — Z79891 Long term (current) use of opiate analgesic: Secondary | ICD-10-CM | POA: Diagnosis present

## 2020-05-05 DIAGNOSIS — Z993 Dependence on wheelchair: Secondary | ICD-10-CM | POA: Diagnosis present

## 2020-05-05 DIAGNOSIS — G8929 Other chronic pain: Secondary | ICD-10-CM

## 2020-05-05 DIAGNOSIS — Z6841 Body Mass Index (BMI) 40.0 and over, adult: Secondary | ICD-10-CM | POA: Diagnosis present

## 2020-05-05 DIAGNOSIS — M25562 Pain in left knee: Secondary | ICD-10-CM

## 2020-05-05 MED ORDER — DICLOFENAC SODIUM 50 MG PO TBEC: 50.0000 mg | DELAYED_RELEASE_TABLET | Freq: Two times a day (BID) | ORAL | 5 refills | Status: DC | PRN

## 2020-05-05 NOTE — Progress Notes (Signed)
Subjective:    Patient ID: [name], female    DOB: October 07, 1965, 55 y.o.   MRN: [id number]  HPI  Pt is a 55 yr old female with hx of HTN, morbid obesity with BMI of 62, myasthenia gravis, DM2 A1c 9.8, depression, pulmonary fibrosis, GERD, depression and decreased EF, and severe B/L knee pain with end stage DJD of B/L knees- here for BL knee pain.   Was using RW, now using motorized w/c.    Started having knee pain 30 years ago.   Did some therapy early on-  Used to get cortisone shots- did for 20 years.  Last got a steroid shot- >8 years.  Has tried the gel injections- not helpful.   No braces that she wears. Is interested in possibly having some.   Ordered power w/c- from PCP - got it in 10/21-  Can't stand to walk long.  [employer].  Has a Advertising account executive power w/c-  [employer].     Tried: Uses OTC Ibuprofen- used to get Rx- but too big. Never tried Diclofenac Rx/PO Tried Tramadol- no side effects- didn't help much with pain- thinks they did the max dose, and still not helpful.     Social Hx: Son- Christian 54 yrs old Has a portable ramp Has a regular Zenaida Niece- goes in the back of van     Pain Inventory Average Pain 10 Pain Right Now 9 My pain is constant, sharp, burning, stabbing, tingling and aching  In the last 24 hours, has pain interfered with the following? General activity 10 Relation with others 10 Enjoyment of life 10 What TIME of day is your pain at its worst? morning , daytime, evening and night Sleep (in general) Poor  Pain is worse with: walking, bending, standing and some activites Pain improves with: rest, medication and TENS Relief from Meds: 9  use a cane use a walker how many minutes can you walk? 2-3 ability to climb steps?  yes do you drive?  yes use a wheelchair transfers alone  disabled: date disabled 1996 I need assistance with the following:  dressing, bathing, meal prep, household duties and shopping  bladder control  problems bowel control problems weakness numbness tremor tingling trouble walking spasms dizziness depression anxiety  New pt  New pt    Family History  Problem Relation Age of Onset  . Hypertension Father   . Sickle cell trait Father   . Diabetes Father        Borderline  . Stroke Paternal Grandmother   . Sickle cell trait Paternal Grandfather   . Diabetes Paternal Grandfather   . Lupus Other        Mother's first cousin  . Crohn's disease Paternal Aunt   . Diabetes Maternal Grandmother   . Anesthesia problems Neg Hx   . Hypotension Neg Hx   . Malignant hyperthermia Neg Hx   . Pseudochol deficiency Neg Hx   . Colon cancer Neg Hx   . Colon polyps Neg Hx   . Heart disease Neg Hx   . Kidney disease Neg Hx   . Esophageal cancer Neg Hx   . Gallbladder disease Neg Hx    Social History   Socioeconomic History  . Marital status: Widowed    Spouse name: Not on file  . Number of children: 4  . Years of education: Not on file  . Highest education level: Not on file  Occupational History  . Occupation: Disabled  Tobacco Use  . Smoking  status: Never Smoker  . Smokeless tobacco: Never Used  Vaping Use  . Vaping Use: Never used  Substance and Sexual Activity  . Alcohol use: No  . Drug use: No  . Sexual activity: Yes    Birth control/protection: Injection  Other Topics Concern  . Not on file  Social History Narrative   Lives in a house with husband (s/p incarceration), daughter, 2 sons, and grandson, unemployed, unemployed, no pets, no tob, no etoh, no exercise, no healthy diet, more than or equal to 12 yrs education, christian, has motorized scooter that she uses periodically, husband smokes in the house   _   ___________________________________________________________________________________________________________________________________________   Update 04/06/2019   Husband is deceased, patient started grief counseling Jan. 2021   Lives with 14 year old son;  has difficulty with ADL's   Social Determinants of Health   Financial Resource Strain: Not on file  Food Insecurity: Not on file  Transportation Needs: Not on file  Physical Activity: Not on file  Stress: Not on file  Social Connections: Not on file   Past Surgical History:  Procedure Laterality Date  . BRONCHOSCOPY  08/2007  . CARPAL TUNNEL RELEASE  05/20/2011   Procedure: CARPAL TUNNEL RELEASE;  Surgeon: [name], MD;  Location: [facility] OR;  Service: Orthopedics;  Laterality: Right;  . CARPAL TUNNEL RELEASE Right 11/22/2019   Procedure: RIGHT HAND CARPAL TUNNEL RELEASE;  Surgeon: Jones Broom, MD;  Location: [facility] ORS;  Service: Orthopedics;  Laterality: Right;  . CESAREAN SECTION  09/15/2004  . COLONOSCOPY N/A 01/21/2014   Procedure: COLONOSCOPY;  Surgeon: Iva Boop, MD;  Location: [facility] ENDOSCOPY;  Service: Endoscopy;  Laterality: N/A;  . cortisone injection     receives an injection every 95months  . ESOPHAGOGASTRODUODENOSCOPY    . ESOPHAGOGASTRODUODENOSCOPY N/A 01/21/2014   Procedure: ESOPHAGOGASTRODUODENOSCOPY (EGD);  Surgeon: Iva Boop, MD;  Location: [facility];  Service: Endoscopy;  Laterality: N/A;  . LAPAROSCOPIC CHOLECYSTECTOMY  07/2008   by Dr. Cyndia Bent  . Thymus resection  08/25/1993  . TRIGGER FINGER RELEASE  05/20/2011   Procedure: RELEASE TRIGGER FINGER/A-1 PULLEY;  Surgeon: [name], MD;  Location: [facility] OR;  Service: Orthopedics;  Laterality: Right;  RIGHT TRIGGER THUMB RELEASE AND RIGHT CARPAL TUNNEL RELEASE   Past Medical History:  Diagnosis Date  . ALLERGIC RHINITIS    takes Zyrtec daily  . Anxiety   . Arthritis   . Asthma    Albuterol prn;Symbicort daily and Singulair daily  . Bronchitis    hx of  . CAP (community acquired pneumonia) 02/20/2015  . Carpal tunnel syndrome    right  . Chronic back pain   . Constipation    Miralax prn  . Depression    takes Cymbalta daily  . Diabetes (HCC)    type 2   .  Dizziness    pt states she gets off balance occasionally  . Eczema   . Gallstones 2010  . GERD (gastroesophageal reflux disease)    takes Omeprazole daily  . Headache(784.0)    couple of times a week  . Hemorrhoids   . Hypertension    takes Prinizide daily and Clonidine on Mondays  . Hypoventilation associated with obesity syndrome (HCC)   . Insomnia   . Irritable bladder   . Joint pain   . Joint swelling   . Lung disease    scleraderma  . Morbid (severe) obesity due to excess calories (HCC) 04/10/2007   Restrictive changes on pfts 11/30/2007    .  Myasthenia gravis 1994   Positive Acetylcholine receptor Ab and single fiber EMG Dr. Clarisa [facility] 1996- Dr. Noreene Filbert 1998, Dr Sharene Skeans 2008 [facility]- Failed prednisone due to weight gain, stopped imuran/mestinon due to finances- Now with quiescent disease per Dr Sharene Skeans and no flares in many years  . Myasthenia gravis   . Nocturia   . OSA (obstructive sleep apnea)    uses pillows   . Osteoarthritis   . Pelvic inflammatory disease (PID)   . Peptic ulcer   . Pneumonia    hx of;last time about 1-42yrs ago  . 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 Coralyn Helling- 10/30/2007 ANA positive, ANA titer  negative, RF less than 20  . Rheumatoid arthritis(714.0)   . Sarcoidosis 12/23/2010   Derm  dx NCG  12/03/10 (path report in EPIC)    Overview:  Overview:  Diagnosed on skin biopsy November 2012  Last Assessment & Plan:  Labs are unremarkable. CXR improved/stable. PFT wnl. I am happy patient had these tests and we have a baseline for her. At this time, I do not see an indication for starting chronic steroids daily. Patient agrees. Her skin lesions are bothering her the most and syst  . Shortness of breath    can be sitting as well as exertion  . Skin irritation    skin itchy  . Trigger finger   . Urinary frequency    takes Ditropan daily  . Urinary urgency   . Viral meningitis    history of  viral meningitis   BP (!) 143/101   Pulse (!) 109   Temp 99.2 F (37.3 C)   Ht 5\' 3"  (1.6 m)   Wt (!) 350 lb 3.2 oz (158.8 kg)   SpO2 94%   BMI 62.04 kg/m   Opioid Risk Score:   Fall Risk Score:  `1  Depression screen PHQ 2/9  Depression screen [facility] 2/9 05/05/2020 10/12/2019 02/16/2019 10/04/2018 09/27/2018 10/27/2017 08/08/2017  Decreased Interest 3 3 3 1  0 0 3  Down, Depressed, Hopeless 1 2 3 1  0 0 3  PHQ - 2 Score 4 5 6 2  0 0 6  Altered sleeping 3 3 3  - - - 3  Tired, decreased energy 3 3 3  - - - 3  Change in appetite 3 3 3  - - - 2  Feeling bad or failure about yourself  1 1 1  - - - 2  Trouble concentrating 3 2 3  - - - 2  Moving slowly or fidgety/restless 2 3 0 - - - 3  Suicidal thoughts 0 0 0 - - - 0  PHQ-9 Score 19 20 19  - - - 21  Difficult doing work/chores Very difficult - Extremely dIfficult - - - -  Some recent data might be hidden    Review of Systems  Gastrointestinal:       Bowel control problems  Genitourinary:       Bladder control problems  Musculoskeletal: Positive for gait problem.  Neurological: Positive for dizziness, tremors, weakness and numbness.  Psychiatric/Behavioral: Positive for dysphoric mood. The patient is nervous/anxious.   All other systems reviewed and are negative.      Objective:   Physical Exam BP 143/101- (last was 190/109 at PCP last month).  Awake, alert, appropriate, accompanied by son; in power w/c, NAD R>L knee pain, even with trace touch Difficult to assess due to pain response 1-2+ crepitus B/L -  TTP most over medial  joint line.  Sitting in power w/c - initially had legs elevated       Assessment & Plan:    Pt is a 55 yr old female with hx of HTN, morbid obesity with BMI of 62, myasthenia gravis, DM2 A1c 9.8, depression, pulmonary fibrosis, GERD, depression and decreased EF, and severe B/L knee pain with end stage DJD of B/L knees- here for BL knee pain.   1. We discussed bariatric surgery- cannot afford it right  now  2. Also discussed that losing 1 lb can take 5 lbs of pressure off knees.   3.  Will get UDS vs oral drug screen today- and get opiate contract signed.   4. Will try Diclofenac, since Kidney function looks very good currently- take AS NEEDED- no more than 10 tabs/week, but CAN take 2 tabs/ in 1 day if needed- just not regularly.   5. Wait for UDS/Oral drug screen and then can prescribe Oxycodone- however no more than 60 tabs/month- want to leave room to increase and don't want to cause too much constipation- Percocet "hurts her stomach" as compared to the Oxycodone-    6. Suggest adding Senna/Sneokot to her med list for constipation.   7. F/U in 8 weeks- .

## 2020-05-05 NOTE — Patient Instructions (Addendum)
Pt is a 55 yr old female with hx of HTN, morbid obesity with BMI of 62, myasthenia gravis, DM2 A1c 9.8, depression, pulmonary fibrosis, GERD, depression and decreased EF, and severe B/L knee pain with end stage DJD of B/L knees- here for BL knee pain.   1. We discussed bariatric surgery- cannot afford it right now  2. Also discussed that losing 1 lb can take 5 lbs of pressure off knees.   3.  Will get UDS vs oral drug screen today- and get opiate contract signed.   4. Will try Diclofenac, since Kidney function looks very good currently- take AS NEEDED- no more than 10 tabs/week, but CAN take 2 tabs/ in 1 day if needed- just not regularly.   5. Wait for UDS/Oral drug screen and then can prescribe Oxycodone- however no more than 60 tabs/month- want to leave room to increase- Percocet "hurts her stomach" as compared to the Oxycodone-   6. Suggest adding Senna/Sneokot to her med list for constipation.   7. F/U in 8 weeks- .

## 2020-05-05 NOTE — Addendum Note (Signed)
Addended by: Doreene Eland on: 05/05/2020 04:07 PM   Modules accepted: Orders

## 2020-05-07 ENCOUNTER — Telehealth: Payer: Self-pay

## 2020-05-07 ENCOUNTER — Telehealth: Payer: Medicaid Other

## 2020-05-07 NOTE — Telephone Encounter (Signed)
  Care Management   Outreach Note  05/07/2020 Name: [name] MRN: [id number] DOB: 1965/11/01  Referred by: [name], MD Reason for referral : Chronic Care Management (HTN/ DM II)   A second unsuccessful telephone outreach was attempted today. The patient was referred to the case management team for assistance with care management and care coordination.   Follow Up Plan: A HIPAA compliant phone message was left for the patient providing contact information and requesting a return call.  The care management team will reach out to the patient again over the next 7-14 days.   Juanell Fairly RN, BSN, [facility] Care Management Coordinator [facility] Phone: [phone] I Fax: [phone]

## 2020-05-09 LAB — DRUG TOX MONITOR 1 W/CONF, ORAL FLD
Amphetamines: NEGATIVE ng/mL (ref <10)
Barbiturates: NEGATIVE ng/mL (ref <10)
Benzodiazepines: NEGATIVE ng/mL (ref <0.50)
Buprenorphine: NEGATIVE ng/mL (ref <0.10)
Cocaine: NEGATIVE ng/mL (ref <5.0)
Codeine: NEGATIVE ng/mL (ref <2.5)
Dihydrocodeine: NEGATIVE ng/mL (ref <2.5)
Fentanyl: NEGATIVE ng/mL (ref <0.10)
Heroin Metabolite: NEGATIVE ng/mL (ref <1.0)
Hydrocodone: NEGATIVE ng/mL (ref <2.5)
Hydromorphone: NEGATIVE ng/mL (ref <2.5)
MARIJUANA: NEGATIVE ng/mL (ref <2.5)
MDMA: NEGATIVE ng/mL (ref <10)
Meprobamate: NEGATIVE ng/mL (ref <2.5)
Methadone: NEGATIVE ng/mL (ref <5.0)
Morphine: NEGATIVE ng/mL (ref <2.5)
Nicotine Metabolite: NEGATIVE ng/mL (ref <5.0)
Norhydrocodone: NEGATIVE ng/mL (ref <2.5)
Noroxycodone: NEGATIVE ng/mL (ref <2.5)
Opiates: NEGATIVE ng/mL (ref <2.5)
Oxymorphone: NEGATIVE ng/mL (ref <2.5)
Phencyclidine: NEGATIVE ng/mL (ref <10)
Tapentadol: NEGATIVE ng/mL (ref <5.0)
Tramadol: NEGATIVE ng/mL (ref <5.0)
Zolpidem: NEGATIVE ng/mL (ref <5.0)

## 2020-05-09 LAB — DRUG TOX ALC METAB W/CON, ORAL FLD: Alcohol Metabolite: NEGATIVE ng/mL (ref <25)

## 2020-05-13 ENCOUNTER — Telehealth: Payer: Self-pay | Admitting: *Deleted

## 2020-05-13 NOTE — Chronic Care Management (AMB) (Signed)
  Care Management   Note  05/13/2020 Name: ESMA KILTS MRN: [id number] DOB: [birth year]  [name] is a 55 y.o. year old female who is a primary care patient of [name], MD and is actively engaged with the care management team. I reached out to Cesia A Popovich by phone today to assist with re-scheduling a follow up visit with the RN Case Manager  Follow up plan: Unsuccessful telephone outreach attempt made. A HIPAA compliant phone message was left for the patient providing contact information and requesting a return call.  The care management team will reach out to the patient again over the next 5 days.  If patient returns call to provider office, please advise to call Embedded Care Management Care Guide Avie Arenas at [phone]   Zinnia Tindall [facility] Guide, Embedded Care Coordination [facility]  Care Management

## 2020-05-15 NOTE — Progress Notes (Signed)
    SUBJECTIVE:   CHIEF COMPLAINT / HPI:   Allergies  Patient reports severe allergy symptoms with nasal obstruction, congestion, rhinorrhea, cough.  She denies any shortness of breath or wheezing.  She has hesitant to use intranasal sprays.  Has been using Zyrtec without much improvement.  DM & HTN Follow up  Last seen with Dr. Obie Dredge on 04/09/2020. She was supposed to follow up with Rachelle in pharamcy but missed her appointment.  Patient prefers to follow-up with Dr. Hildred Laser as she has worked with him closely in the past.  At this time, patient reports that she is taking her hypertension medications. Patient used ot be on lisinopril, but hasn't taken for a whie. Does not remember why she was taken off.  For her diabetes, she does report that she has taking her Lantus more though she continues to have trouble taking it.  She is taking her Trulicity every Sunday.  COVID booster Patient requesting COVID booster today.  PERTINENT  PMH / PSH: Hypertension, OSA, pulmonary fibrosis, GERD, type 2 diabetes, myasthenia, DJD, depression, morbid obesity, HFpEF  OBJECTIVE:   BP (!) 136/94   Pulse (!) 115   Ht 5\' 3"  (1.6 m)   Wt (!) 353 lb (160.1 kg)   SpO2 94%   BMI 62.53 kg/m   General: Well-appearing female, no acute distress.  In her power chair today and accompanied by her son.  No respiratory distress.  ASSESSMENT/PLAN:   Essential (primary) hypertension On chart review, appears that patient has worked closely with pharmacy to manage her blood pressure.  She has been on this regimen for several years.  Her blood pressure today is 136/94.  She was previously scheduled to follow-up with pharmacy but missed her appointment.  She prefers to work with Dr. given their relationship in the past.  Patient scheduled for follow-up for hypertension and diabetes management.  Diabetes [facility]) Patient is taking her Lantus more often though continues to have trouble taking it.  Follow-up with  [facility]  Allergic rhinitis We will try Flonase, though patient does not like having anything in her nose.  Counseled on how to use.  Patient will try.  She is also requesting refills of her inhaler and Singulair.       Raymondo Band, MD [facility]

## 2020-05-15 NOTE — Chronic Care Management (AMB) (Signed)
  Care Management   Note  05/15/2020 Name: FARZANA KOCI MRN: [id number] DOB: Dec 25, 1965  [name] is a 54 y.o. year old female who is a primary care patient of [name], MD and is actively engaged with the care management team. I reached out to Tekesha A Wolk by phone today to assist with re-scheduling a follow up visit with the RN Case Manager  Follow up plan: Second unsuccessful telephone outreach attempt made. A HIPAA compliant phone message was left for the patient providing contact information and requesting a return call.  The care management team will reach out to the patient again over the next 7 days.  If patient returns call to provider office, please advise to call Embedded Care Management Care Guide Avie Arenas at [phone]  Noorah Giammona [facility] Guide, Embedded Care Coordination [facility]  Care Management

## 2020-05-16 ENCOUNTER — Other Ambulatory Visit: Payer: Self-pay

## 2020-05-16 ENCOUNTER — Ambulatory Visit (INDEPENDENT_AMBULATORY_CARE_PROVIDER_SITE_OTHER): Payer: Medicaid Other | Admitting: Family Medicine

## 2020-05-16 ENCOUNTER — Telehealth: Payer: Self-pay | Admitting: *Deleted

## 2020-05-16 ENCOUNTER — Ambulatory Visit (INDEPENDENT_AMBULATORY_CARE_PROVIDER_SITE_OTHER): Payer: Medicaid Other

## 2020-05-16 ENCOUNTER — Encounter: Payer: Self-pay | Admitting: Family Medicine

## 2020-05-16 DIAGNOSIS — Z794 Long term (current) use of insulin: Secondary | ICD-10-CM

## 2020-05-16 DIAGNOSIS — Z23 Encounter for immunization: Secondary | ICD-10-CM | POA: Diagnosis present

## 2020-05-16 DIAGNOSIS — J45909 Unspecified asthma, uncomplicated: Secondary | ICD-10-CM

## 2020-05-16 DIAGNOSIS — E1165 Type 2 diabetes mellitus with hyperglycemia: Secondary | ICD-10-CM

## 2020-05-16 DIAGNOSIS — I10 Essential (primary) hypertension: Secondary | ICD-10-CM | POA: Diagnosis not present

## 2020-05-16 DIAGNOSIS — J301 Allergic rhinitis due to pollen: Secondary | ICD-10-CM

## 2020-05-16 MED ORDER — FLUTICASONE PROPIONATE 50 MCG/ACT NA SUSP: 2.0000 | Freq: Every day | NASAL | 6 refills | Status: DC

## 2020-05-16 MED ORDER — BUDESONIDE-FORMOTEROL FUMARATE 160-4.5 MCG/ACT IN AERO: 2.0000 | INHALATION_SPRAY | Freq: Two times a day (BID) | RESPIRATORY_TRACT | 11 refills | Status: DC

## 2020-05-16 MED ORDER — MONTELUKAST SODIUM 10 MG PO TABS: 10.0000 mg | ORAL_TABLET | Freq: Every day | ORAL | 0 refills | Status: DC

## 2020-05-16 MED ORDER — ALBUTEROL SULFATE HFA 108 (90 BASE) MCG/ACT IN AERS: 2.0000 | INHALATION_SPRAY | RESPIRATORY_TRACT | 12 refills | Status: DC | PRN

## 2020-05-16 NOTE — Assessment & Plan Note (Signed)
Patient is taking her Lantus more often though continues to have trouble taking it.  Follow-up with Dr. Raymondo Band

## 2020-05-16 NOTE — Telephone Encounter (Signed)
Rescheduled 05/26/2020

## 2020-05-16 NOTE — Assessment & Plan Note (Signed)
We will try Flonase, though patient does not like having anything in her nose.  Counseled on how to use.  Patient will try.  She is also requesting refills of her inhaler and Singulair.

## 2020-05-16 NOTE — Telephone Encounter (Signed)
Oral swab drug screen was consistent for no prescribed controlled substances.

## 2020-05-16 NOTE — Chronic Care Management (AMB) (Signed)
  Care Management   Note  05/16/2020 Name: [name] MRN: [id number] DOB: [birth year]  [name] is a 55 y.o. year old female who is a primary care patient of [name], MD and is actively engaged with the care management team. I reached out to Colene A Duford by phone today to assist with re-scheduling a follow up visit with the RN Case Manager  Follow up plan: Telephone appointment with care management team member scheduled for:05/26/2020   If patient returns call to provider office, please advise to call Embedded Care Management Care Guide Gwenevere Ghazi at [phone].  Gwenevere Ghazi  Care Guide, Embedded Care Coordination [facility] Management

## 2020-05-16 NOTE — Assessment & Plan Note (Signed)
On chart review, appears that patient has worked closely with pharmacy to manage her blood pressure.  She has been on this regimen for several years.  Her blood pressure today is 136/94.  She was previously scheduled to follow-up with pharmacy but missed her appointment.  She prefers to work with Dr. Raymondo Band given their relationship in the past.  Patient scheduled for follow-up for hypertension and diabetes management.

## 2020-05-16 NOTE — Patient Instructions (Addendum)
Diabetes and Hypertension  - follow up with Dr. Raymondo Band   To schedule your diabetic eye exam, please call [facility] at [phone]. They are located at: [address] [place], [place] [address]   Allergies - try flonase daily   Health Maintenance You are due for a mammogram and pap smear.  Please call the breast center to make an appointment: [facility] [address], [place], [place] [address]  [phone]  Please come back to complete the pap smear for cervical cancer screening.   Health Maintenance Due  Topic Date Due  . Hepatitis C Screening  Never done  . OPHTHALMOLOGY EXAM  Never done  . FOOT EXAM  09/13/2017  . MAMMOGRAM  09/07/2019  . PAP SMEAR-Modifier  07/04/2020

## 2020-05-26 ENCOUNTER — Ambulatory Visit: Payer: Medicaid Other

## 2020-05-26 NOTE — Chronic Care Management (AMB) (Signed)
Care Management    RN Visit Note  05/26/2020 Name: [name] MRN: [id number] DOB: [birth year]  Subjective: [name] is a 55 y.o. year old female who is a primary care patient of Wilber Oliphant, MD. The care management team was consulted for assistance with disease management and care coordination needs.    Engaged with patient by telephone for follow up visit in response to provider referral for case management and/or care coordination services.   The patient was given information about Chronic Care Management services, agreed to services, and gave verbal consent prior to initiation of services.  Please see initial visit note for detailed documentation. Patient agreed to services and consent obtained.    Assessment: Patient continues to experience difficulty with checking her Blood sugars and Blood pressure .  She states that she also has a hard time remembering to take her medications... See Care Plan below for interventions and patient self-care actives. Follow up Plan: Patient would like continued follow-up.  CCM RNCM will outreach the patient within the next 14 days.  Patient will call office if needed prior to next encounter  Review of patient past medical history, allergies, medications, health status, including review of consultants reports, laboratory and other test data, was performed as part of comprehensive evaluation and provision of chronic care management services.   SDOH (Social Determinants of Health) assessments and interventions performed:    Care Plan  Allergies  Allergen Reactions  . Apple Other (See Comments)    "mouth itch" - lumps on tongue  . Banana Other (See Comments)    "whelps on tongue"  . Metformin And Related Nausea Only  . Shrimp [Shellfish Allergy] Nausea And Vomiting and Swelling    Outpatient Encounter Medications as of 05/26/2020  Medication Sig Note  . ACCU-CHEK AVIVA PLUS test strip USE AS INSTRUCTED   . Accu-Chek Softclix Lancets  lancets USE AS INSTRUCTED   . acetaminophen (TYLENOL) 500 MG tablet Take 500 mg by mouth every 6 (six) hours as needed.   Marland Kitchen albuterol (PROAIR HFA) 108 (90 Base) MCG/ACT inhaler Inhale 2 puffs into the lungs every 4 (four) hours as needed.   Marland Kitchen amLODipine (NORVASC) 5 MG tablet Take 1 tablet (5 mg total) by mouth at bedtime.   Marland Kitchen atorvastatin (LIPITOR) 20 MG tablet Take 1 tablet (20 mg total) by mouth daily. 04/15/2020: Patient has not started taking it yet  . Blood Glucose Monitoring Suppl (ACCU-CHEK AVIVA PLUS) w/Device KIT 1 each by Does not apply route 3 (three) times daily after meals.   . budesonide-formoterol (SYMBICORT) 160-4.5 MCG/ACT inhaler Inhale 2 puffs into the lungs 2 (two) times daily.   Marland Kitchen buPROPion (WELLBUTRIN XL) 300 MG 24 hr tablet TAKE 1 TABLET BY MOUTH EVERY DAY   . cetirizine (ZYRTEC) 10 MG tablet Take 1 tablet (10 mg total) by mouth daily.   . cloNIDine (CATAPRES - DOSED IN MG/24 HR) 0.3 mg/24hr patch Place 1 patch (0.3 mg total) onto the skin once a week.   . cloNIDine (CATAPRES) 0.1 MG tablet PLEASE SEE ATTACHED FOR DETAILED DIRECTIONS (Patient taking differently: Take 0.1 mg by mouth daily as needed (in between patches).)   . Dulaglutide (TRULICITY) 1.5 WO/0.3OZ SOPN Inject 1.5 mg into the skin once a week. Take on Sunday   . fluticasone (FLONASE) 50 MCG/ACT nasal spray Place 2 sprays into both nostrils daily.   . furosemide (LASIX) 20 MG tablet TAKE 1 TABLET (20 MG TOTAL) BY MOUTH DAILY AS NEEDED. (Patient  taking differently: Take 20 mg by mouth daily.)   . hydrochlorothiazide (HYDRODIURIL) 25 MG tablet Take 1 tablet (25 mg total) by mouth daily.   . insulin glargine (LANTUS SOLOSTAR) 100 UNIT/ML Solostar Pen Inject 120 Units into the skin at bedtime.   . Insulin Pen Needle (PEN NEEDLES 3/16") 31G X 5 MM MISC Use with Lantus solostar   . montelukast (SINGULAIR) 10 MG tablet Take 1 tablet (10 mg total) by mouth at bedtime.   Marland Kitchen omeprazole (PRILOSEC) 40 MG capsule Take 1 capsule  (40 mg total) by mouth daily.   Marland Kitchen oxybutynin (DITROPAN) 5 MG tablet Take 1 tablet (5 mg total) by mouth 2 (two) times daily.   Marland Kitchen oxycodone (OXY-IR) 5 MG capsule Take 1 capsule (5 mg total) by mouth every 6 (six) hours as needed. 05/05/2020: Didn't bring bottle, LD 05/04/20  . polyethylene glycol (MIRALAX / GLYCOLAX) packet Take 17 g by mouth daily as needed for mild constipation.   . pregabalin (LYRICA) 50 MG capsule TAKE 1 CAPSULE BY MOUTH TWICE A DAY   . RESTASIS MULTIDOSE 0.05 % ophthalmic emulsion     No facility-administered encounter medications on file as of 05/26/2020.    Patient Active Problem List   Diagnosis Date Noted  . Wheelchair dependence 05/05/2020  . Carpal tunnel syndrome of right wrist, RECURRENT 10/15/2019  . Insomnia, idiopathic 09/30/2018  . Grade I diastolic dysfunction 03/88/8280  . Encounter for chronic pain management 02/11/2014  . Diabetes ([place]) 02/11/2014  . Cystocele 09/14/2012  . Chronic pain of both knees 05/28/2010  . 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  . Allergic rhinitis 05/30/2008  . 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 Obstructive sleep apnea 10/30/2007  . BMI 60.0-69.9, adult ([place]) 04/10/2007  . MYASTHENIA 03/07/2007  . Depression, recurrent ([place]) 03/24/2006  . Essential (primary) hypertension 03/24/2006  . Gastro-esophageal reflux disease without esophagitis 03/24/2006    Conditions to be addressed/monitored: HTN and DMII  Care Plan : RN Case Manager  Updates made by [name], RN since 05/26/2020 12:00 AM  Problem: Glycemic and Hypertention  Management   Priority: High  Onset Date: 04/15/2020  Objective:  Lab Results  Component Value Date   HGBA1C 9.8 (A) 04/09/2020 .   Lab Results  Component Value Date   CREATININE 0.72 04/09/2020   CREATININE 0.76 11/16/2019   CREATININE 0.73 09/27/2018   BP Readings from Last 3 Encounters:  04/09/20 (!) 183/109   03/17/20 (!) 159/109  11/22/19 (!) 156/102     Current Barriers:  Marland Kitchen Knowledge Deficits related to basic Diabetes pathophysiology and self care/management . Knowledge Deficits related to basic understanding of hypertension pathophysiology and self-care management . Patient does not remember to take her medications  Case Manager Clinical Goal(s):  Marland Kitchen Over the next 90 days, patient will demonstrate improved adherence to prescribed treatment plan by taking all medications, for diabetes self care/management as evidenced by: lowering her a1c by 1-2 points and for hypertension as evidenced by taking all medications as prescribed, monitoring, and recording blood pressure as directed, adhering to low sodium/DASH diet   Interventions:  DM II . Provided education to patient about basic DM disease process . Reviewed medications with patient and discussed importance of medication adherence and way to remember to take her medications . Discussed plans with patient for ongoing care management follow up and provided patient with direct contact information for care management team . Review of patient status, including review of  consultants reports, relevant laboratory and other test results, and medications completed. . Patient states that she has received the information in the mail but has not looked on my chart. . healthy lifestyle promoted  Anticipate A1C testing (point-of-care) every 3 to 6 months based on goal attainment.   Review mutually-set A1C goal or target range.   Anticipate use of antihyperglycemic with or without insulin and periodic adjustments; consider active involvement of pharmacist.   Compare self-reported symptoms of hypo or hyperglycemia to blood glucose levels, diet and fluid intake, current medications, psychosocial and physiologic stressors, change in activity and barriers to care adherence.   Promote self-monitoring of blood glucose levels.   Assess and address barriers to  management plan, such as food taking medications daily.  . modest weight loss (5 percent) promoted . blood glucose monitoring encouraged . blood glucose readings reviewed . She has not been checking her blood sugar because she states that she has a hard time remembering. HTN Evaluation of current treatment plan related to hypertension self management and patient's adherence to plan as established by provider. . Provided education to patient re: stroke prevention, s/s of heart attack and stroke, DASH diet, complications of uncontrolled blood pressure . Reviewed medications with patient and discussed importance of compliance . Discussed plans with patient for ongoing care management follow up and provided patient with direct contact information for care management team . Advised patient, providing education and rationale, to monitor blood pressure daily and record, calling PCP for findings outside established parameters. Discussed with the patient the importance of checking her BP and what can happen to her body with elevated BP . Reviewed scheduled/upcoming provider appointments including: - . pain assessed and managed-pain level 8/10 for her knees and right hip . reduction of dietary sodium encouraged . medication adherence assessment completed-patient states that she forgets to take her medications as prescribed, discussed options for with her to help her remember . support and encouragement provided . She states that she did receive the information in the mail but has not looked at her my chart. . 1.  She forgets to take her meds because she states she has memory issues.  I asked her could she set her clock on her phone and she said that was not good because it does not alarm.  She said when it did she was able to stay on top of things. 2. Because she cant remember to take her meds, she will take them all at one time and when she does an it makes her stomach her.  She said when she doesn't feel  well she does not want to take her meds either. So she has a conundrum. Marland Kitchen RNCM has sent a message to the Pharmacist Marzetta Merino asking for any Ideas that she feels may help.   Patient Goals/Self Care Activities:   Patient verbalizes understanding of plan Self-administers medications as prescribed  Calls pharmacy for medication refills  Call's provider office for new concerns or questions  Check blood sugar at prescribed times  Check blood sugar if I feel it is too high or too low . Checks BP and records as discussed . Follows a low sodium diet/DASH diet       [name] RN, BSN, [place] Phone: [phone] I Fax: [phone]

## 2020-05-26 NOTE — Patient Instructions (Signed)
Visit Information  Ms. Carrozza  it was nice speaking with you. Please call me directly [phone] if you have questions about the goals we discussed.  Goals Addressed            This Visit's Progress   . Monitor and Manage My Blood Sugar-Diabetes Type 2       Timeframe:  Long-Range Goal Priority:  High Start Date:    04/15/20                         Expected End Date:    07/24/20                      - check blood sugar at prescribed times - take the blood sugar log to all doctor visits    Why is this important?    Checking your blood sugar at home helps to keep it from getting very high or very low.   Writing the results in a diary or log helps the doctor know how to care for you.   Your blood sugar log should have the time, date and the results.   Also, write down the amount of insulin or other medicine that you take.   Other information, like what you ate, exercise done and how you were feeling, will also be helpful.     Notes:     . Track and Manage My Blood Pressure-Hypertension       Timeframe:  Long-Range Goal Priority:  High Start Date:   04/15/20                          Expected End Date:  07/24/20                     - check blood pressure daily - choose a place to take my blood pressure (home, clinic or office, retail store) - write blood pressure results in a log or diary    Why is this important?    You won't feel high blood pressure, but it can still hurt your blood vessels.   High blood pressure can cause heart or kidney problems. It can also cause a stroke.   Making lifestyle changes like losing a little weight or eating less salt will help.   Checking your blood pressure at home and at different times of the day can help to control blood pressure.   If the doctor prescribes medicine remember to take it the way the doctor ordered.   Call the office if you cannot afford the medicine or if there are questions about it.     Notes:        The  patient verbalized understanding of instructions, educational materials, and care plan provided today and declined offer to receive copy of patient instructions, educational materials, and care plan.   Follow up Plan: Patient would like continued follow-up.  CCM RNCM will outreach the patient within the next 14 days  Patient will call office if needed prior to next encounter  Juanell Fairly, RN  [phone]

## 2020-05-28 ENCOUNTER — Ambulatory Visit: Payer: Medicaid Other | Admitting: Family Medicine

## 2020-05-30 DIAGNOSIS — G8929 Other chronic pain: Secondary | ICD-10-CM

## 2020-05-30 DIAGNOSIS — M25562 Pain in left knee: Secondary | ICD-10-CM

## 2020-05-30 DIAGNOSIS — M174 Other bilateral secondary osteoarthritis of knee: Secondary | ICD-10-CM

## 2020-06-03 MED ORDER — OXYCODONE HCL 5 MG PO CAPS: 5.0000 mg | ORAL_CAPSULE | Freq: Four times a day (QID) | ORAL | 0 refills | Status: DC | PRN

## 2020-06-03 NOTE — Telephone Encounter (Signed)
Called pt- sent in Oxycodone 5 mg BID #60 to her pharmacy- hopefully insurance won't require a preauthorization, since got one in February 2022.    Also called pharmacy- they got approval for Diclofenac, but she never picked up, after they got authorization- they will get both ready for her.  Thank you- ML

## 2020-06-05 ENCOUNTER — Ambulatory Visit: Payer: Medicaid Other | Admitting: Pharmacist

## 2020-06-10 ENCOUNTER — Ambulatory Visit: Payer: Medicaid Other

## 2020-06-11 NOTE — Chronic Care Management (AMB) (Signed)
Care Management    RN Visit Note  06/11/2020 Name: [name] MRN: [id number] DOB: [birth year]  Subjective: [name] is a 55 y.o. year old female who is a primary care patient of Wilber Oliphant, MD. The care management team was consulted for assistance with disease management and care coordination needs.    Engaged with patient by telephone for follow up visit in response to provider referral for case management and/or care coordination services.   The patient was given information about Chronic Care Management services, agreed to services, and gave verbal consent prior to initiation of services.  Please see initial visit note for detailed documentation. Patient agreed to services and consent obtained.   Assessment: Review of patient past medical history, allergies, medications, health status, including review of consultants reports, laboratory and other test data, was performed as part of comprehensive evaluation and provision of chronic care management services.   SDOH (Social Determinants of Health) assessments and interventions performed:    Care Plan  Allergies  Allergen Reactions  . Apple Other (See Comments)    "mouth itch" - lumps on tongue  . Banana Other (See Comments)    "whelps on tongue"  . Metformin And Related Nausea Only  . Shrimp [Shellfish Allergy] Nausea And Vomiting and Swelling    Outpatient Encounter Medications as of 06/10/2020  Medication Sig Note  . ACCU-CHEK AVIVA PLUS test strip USE AS INSTRUCTED   . Accu-Chek Softclix Lancets lancets USE AS INSTRUCTED   . acetaminophen (TYLENOL) 500 MG tablet Take 500 mg by mouth every 6 (six) hours as needed.   Marland Kitchen albuterol (PROAIR HFA) 108 (90 Base) MCG/ACT inhaler Inhale 2 puffs into the lungs every 4 (four) hours as needed.   Marland Kitchen amLODipine (NORVASC) 5 MG tablet Take 1 tablet (5 mg total) by mouth at bedtime.   Marland Kitchen atorvastatin (LIPITOR) 20 MG tablet Take 1 tablet (20 mg total) by mouth daily. 04/15/2020:  Patient has not started taking it yet  . Blood Glucose Monitoring Suppl (ACCU-CHEK AVIVA PLUS) w/Device KIT 1 each by Does not apply route 3 (three) times daily after meals.   . budesonide-formoterol (SYMBICORT) 160-4.5 MCG/ACT inhaler Inhale 2 puffs into the lungs 2 (two) times daily.   Marland Kitchen buPROPion (WELLBUTRIN XL) 300 MG 24 hr tablet TAKE 1 TABLET BY MOUTH EVERY DAY   . cetirizine (ZYRTEC) 10 MG tablet Take 1 tablet (10 mg total) by mouth daily.   . cloNIDine (CATAPRES - DOSED IN MG/24 HR) 0.3 mg/24hr patch Place 1 patch (0.3 mg total) onto the skin once a week.   . cloNIDine (CATAPRES) 0.1 MG tablet PLEASE SEE ATTACHED FOR DETAILED DIRECTIONS (Patient taking differently: Take 0.1 mg by mouth daily as needed (in between patches).)   . Dulaglutide (TRULICITY) 1.5 NW/2.9FA SOPN Inject 1.5 mg into the skin once a week. Take on Sunday   . fluticasone (FLONASE) 50 MCG/ACT nasal spray Place 2 sprays into both nostrils daily.   . furosemide (LASIX) 20 MG tablet TAKE 1 TABLET (20 MG TOTAL) BY MOUTH DAILY AS NEEDED. (Patient taking differently: Take 20 mg by mouth daily.)   . hydrochlorothiazide (HYDRODIURIL) 25 MG tablet Take 1 tablet (25 mg total) by mouth daily.   . Insulin Pen Needle (PEN NEEDLES 3/16") 31G X 5 MM MISC Use with Lantus solostar   . montelukast (SINGULAIR) 10 MG tablet Take 1 tablet (10 mg total) by mouth at bedtime.   Marland Kitchen omeprazole (PRILOSEC) 40 MG capsule Take 1 capsule (  40 mg total) by mouth daily.   Marland Kitchen oxybutynin (DITROPAN) 5 MG tablet Take 1 tablet (5 mg total) by mouth 2 (two) times daily.   Marland Kitchen oxycodone (OXY-IR) 5 MG capsule Take 1 capsule (5 mg total) by mouth every 6 (six) hours as needed.   . polyethylene glycol (MIRALAX / GLYCOLAX) packet Take 17 g by mouth daily as needed for mild constipation.   . pregabalin (LYRICA) 50 MG capsule TAKE 1 CAPSULE BY MOUTH TWICE A DAY   . RESTASIS MULTIDOSE 0.05 % ophthalmic emulsion     No facility-administered encounter medications on file as  of 06/10/2020.    Patient Active Problem List   Diagnosis Date Noted  . Wheelchair dependence 05/05/2020  . Carpal tunnel syndrome of right wrist, RECURRENT 10/15/2019  . Insomnia, idiopathic 09/30/2018  . Grade I diastolic dysfunction 07/18/7626  . Encounter for chronic pain management 02/11/2014  . Diabetes ([place]) 02/11/2014  . Cystocele 09/14/2012  . Chronic pain of both knees 05/28/2010  . 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  . Allergic rhinitis 05/30/2008  . 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 Obstructive sleep apnea 10/30/2007  . BMI 60.0-69.9, adult ([place]) 04/10/2007  . MYASTHENIA 03/07/2007  . Depression, recurrent ([place]) 03/24/2006  . Essential (primary) hypertension 03/24/2006  . Gastro-esophageal reflux disease without esophagitis 03/24/2006    Conditions to be addressed/monitored: HTN and DMII  Care Plan : RN Case Manager  Updates made by [name], RN since 06/11/2020 12:00 AM  Problem: Glycemic and Hypertention  Management   Priority: High  Onset Date: 04/15/2020  Objective:  Lab Results  Component Value Date   HGBA1C 9.8 (A) 04/09/2020 .   Lab Results  Component Value Date   CREATININE 0.72 04/09/2020   CREATININE 0.76 11/16/2019   CREATININE 0.73 09/27/2018   BP Readings from Last 3 Encounters:  04/09/20 (!) 183/109  03/17/20 (!) 159/109  11/22/19 (!) 156/102     Current Barriers:  Marland Kitchen Knowledge Deficits related to basic Diabetes pathophysiology and self care/management . Knowledge Deficits related to basic understanding of hypertension pathophysiology and self-care management . Patient does not remember to take her medications  Case Manager Clinical Goal(s):  Marland Kitchen Over the next 90 days, patient will demonstrate improved adherence to prescribed treatment plan by taking all medications, for diabetes self care/management as evidenced by: lowering her a1c by 1-2 points and  for hypertension as evidenced by taking all medications as prescribed, monitoring, and recording blood pressure as directed, adhering to low sodium/DASH diet   Interventions:  DM II . Provided education to patient about basic DM disease process . Reviewed medications with patient and discussed importance of medication adherence and way to remember to take her medications . Discussed plans with patient for ongoing care management follow up and provided patient with direct contact information for care management team . Review of patient status, including review of consultants reports, relevant laboratory and other test results, and medications completed. . Patient states that she has received the information in the mail but has not looked on my chart. . healthy lifestyle promoted  Anticipate A1C testing (point-of-care) every 3 to 6 months based on goal attainment.   Review mutually-set A1C goal or target range.   Anticipate use of antihyperglycemic with or without insulin and periodic adjustments; consider active involvement of pharmacist.   Compare self-reported symptoms of hypo or hyperglycemia to blood glucose levels, diet and fluid intake, current medications, psychosocial and  physiologic stressors, change in activity and barriers to care adherence.   Promote self-monitoring of blood glucose levels.   Assess and address barriers to management plan, such as food taking medications daily.  . modest weight loss (5 percent) promoted . blood glucose monitoring encouraged . blood glucose readings reviewed . She reports that she is trying to take her medications on time and checking her blood sugars and blood pressures.  She did give me some values. Date Blood sugar and BP in the am ? 5/3    287 190/93 ? 5/4    190  ? 5/6    176 126/76 ? 5/8    164 153/90 ? 5/9    189 137/78 ? 5/10   183 173/102 ? 5/11   187 165/101 ? 5/12   137 161/87 ? 5/13   136 143/85  ? 5/14   245  ? 5/15   185  156/86 ? 5/17   149 129/85 ? RNCM congratulated the patient and told her how proud she was of her to be able to take her meds and check her blood sugars and pressures for the majority of the month.  HTN Evaluation of current treatment plan related to hypertension self management and patient's adherence to plan as established by provider. . Provided education to patient re: stroke prevention, s/s of heart attack and stroke, DASH diet, complications of uncontrolled blood pressure . Reviewed medications with patient and discussed importance of compliance . Discussed plans with patient for ongoing care management follow up and provided patient with direct contact information for care management team . Advised patient, providing education and rationale, to monitor blood pressure daily and record, calling PCP for findings outside established parameters. Discussed with the patient the importance of checking her BP and what can happen to her body with elevated BP . Reviewed scheduled/upcoming provider appointments including: - . pain assessed and managed-pain level 8/10 for her knees and right hip . reduction of dietary sodium encouraged . medication adherence assessment completed-patient states that she forgets to take her medications as prescribed, discussed options for with her to help her remember . support and encouragement provided . 1.  She forgets to take her meds because she states she has memory issues.  I asked her could she set her clock on her phone and she said that was not good because it does not alarm.  She said when it did she was able to stay on top of things. 2. Because she cant remember to take her meds, she will take them all at one time and when she does an it makes her stomach her.  She said when she doesn't feel well she does not want to take her meds either. So she has a conundrum. Marland Kitchen RNCM has sent a message to the Pharmacist Marzetta Merino asking for any Ideas that she feels may  help. Pharmacist stated that she will forward the message to [place] who will be seeing the patient for appt later.   Patient Goals/Self Care Activities:   Patient verbalizes understanding of plan Self-administers medications as prescribed  Calls pharmacy for medication refills  Call's provider office for new concerns or questions  Check blood sugar at prescribed times  Check blood sugar if I feel it is too high or too low . Checks BP and records as discussed . Follows a low sodium diet/DASH diet       Rosalee Kaufman, BSN, [place]  [place] Phone: [phone] I Fax: [phone]

## 2020-06-11 NOTE — Patient Instructions (Signed)
Visit Information  [name]  it was nice speaking with you. Please call me directly [phone] if you have questions about the goals we discussed.  Goals Addressed            This Visit's Progress   . Monitor and Manage My Blood Sugar-Diabetes Type 2       Timeframe:  Long-Range Goal Priority:  High Start Date:    04/15/20                         Expected End Date:    08/22/20                 - check blood sugar at prescribed times - take the blood sugar log to all doctor visits    Why is this important?    Checking your blood sugar at home helps to keep it from getting very high or very low.   Writing the results in a diary or log helps the doctor know how to care for you.   Your blood sugar log should have the time, date and the results.   Also, write down the amount of insulin or other medicine that you take.   Other information, like what you ate, exercise done and how you were feeling, will also be helpful.     Notes:     . Track and Manage My Blood Pressure-Hypertension       Timeframe:  Long-Range Goal Priority:  High Start Date:   04/15/20                          Expected End Date:  08/22/20               - check blood pressure daily - choose a place to take my blood pressure (home, clinic or office, retail store) - write blood pressure results in a log or diary    Why is this important?    You won't feel high blood pressure, but it can still hurt your blood vessels.   High blood pressure can cause heart or kidney problems. It can also cause a stroke.   Making lifestyle changes like losing a little weight or eating less salt will help.   Checking your blood pressure at home and at different times of the day can help to control blood pressure.   If the doctor prescribes medicine remember to take it the way the doctor ordered.   Call the office if you cannot afford the medicine or if there are questions about it.     Notes:        The patient  verbalized understanding of instructions, educational materials, and care plan provided today and declined offer to receive copy of patient instructions, educational materials, and care plan.   Follow up Plan: Patient would like continued follow-up.  CCM RNCM will outreach the patient within the next 6 weeks  Patient will call office if needed prior to next encounter  Juanell Fairly, RN  [phone]

## 2020-06-20 ENCOUNTER — Other Ambulatory Visit: Payer: Self-pay | Admitting: *Deleted

## 2020-06-20 MED ORDER — CLONIDINE 0.3 MG/24HR TD PTWK: 0.3000 mg | MEDICATED_PATCH | TRANSDERMAL | 3 refills | Status: DC

## 2020-07-04 ENCOUNTER — Encounter: Payer: Medicaid Other | Admitting: Physical Medicine and Rehabilitation

## 2020-07-04 ENCOUNTER — Ambulatory Visit: Payer: Medicaid Other | Admitting: Pharmacist

## 2020-07-10 ENCOUNTER — Ambulatory Visit: Payer: Medicaid Other | Admitting: Pharmacist

## 2020-07-14 ENCOUNTER — Ambulatory Visit: Payer: Medicaid Other | Admitting: Psychology

## 2020-07-22 ENCOUNTER — Telehealth: Payer: Self-pay | Admitting: Licensed Clinical Social Worker

## 2020-07-22 ENCOUNTER — Telehealth: Payer: Medicaid Other

## 2020-07-22 NOTE — Chronic Care Management (AMB) (Addendum)
    Clinical Social Work  Care Management   Phone Outreach    07/22/2020 Name: AMBREE FRANCES MRN: [id number] DOB: 1965/05/08  [name] is a 55 y.o. year old female who is a primary care patient of Selena Batten, Vinnie Langton, MD .   CCM LCSW providing coverage for CCM RN.  F/U phone appointment scheduled with CCM RN today to assess needs, and progress with care plan goals. Telephone outreach was unsuccessful A HIPPA compliant phone message was left for the patient providing contact information and requesting a return call.   Plan:Will route chart to Care Guide to see if patient would like to reschedule phone appointment with CCM RN Traci.  Review of patient status, including review of consultants reports, relevant laboratory and other test results, and collaboration with appropriate care team members and the patient's provider was performed as part of comprehensive patient evaluation and provision of care management services.     Sammuel Hines, LCSW Care Management & Coordination  [facility] / [facility]   [phone] 3:34 PM

## 2020-08-12 ENCOUNTER — Telehealth: Payer: Self-pay | Admitting: *Deleted

## 2020-08-12 NOTE — Chronic Care Management (AMB) (Signed)
  Care Management   Note  08/12/2020 Name: [name] MRN: [id number] DOB: February 23, 1965  [name] is a 55 y.o. year old female who is a primary care patient of Fayette Pho, MD and is actively engaged with the care management team. I reached out to Kajah A Patalano by phone today to assist with re-scheduling a follow up visit with the RN Case Manager  Follow up plan: Unsuccessful telephone outreach attempt made. A HIPAA compliant phone message was left for the patient providing contact information and requesting a return call. The care management team will reach out to the patient again over the next 7 days. If patient returns call to provider office, please advise to call Embedded Care Management Care Guide Gwenevere Ghazi at [phone].  Gwenevere Ghazi  Care Guide, Embedded Care Coordination [facility] Management

## 2020-08-19 ENCOUNTER — Other Ambulatory Visit: Payer: Self-pay | Admitting: Family Medicine

## 2020-08-19 NOTE — Chronic Care Management (AMB) (Signed)
  Care Management   Note  08/19/2020 Name: [name] MRN: [id number] DOB: 12-28-1965  [name] is a 55 y.o. year old female who is a primary care patient of Fayette Pho, MD and is actively engaged with the care management team. I reached out to [name] by phone today to assist with re-scheduling a follow up visit with the RN Case Manager  Follow up plan: Telephone appointment with care management team member scheduled for:08/28/20  Gwenevere Ghazi  Care Guide, Embedded Care Coordination [facility]  Care Management  Direct Dial: [phone]

## 2020-08-20 ENCOUNTER — Other Ambulatory Visit: Payer: Self-pay | Admitting: Family Medicine

## 2020-08-20 DIAGNOSIS — I10 Essential (primary) hypertension: Secondary | ICD-10-CM

## 2020-08-21 ENCOUNTER — Other Ambulatory Visit: Payer: Self-pay | Admitting: Family Medicine

## 2020-08-21 DIAGNOSIS — E0810 Diabetes mellitus due to underlying condition with ketoacidosis without coma: Secondary | ICD-10-CM

## 2020-08-25 ENCOUNTER — Other Ambulatory Visit: Payer: Self-pay | Admitting: Family Medicine

## 2020-08-28 ENCOUNTER — Ambulatory Visit: Payer: Medicaid Other

## 2020-08-28 ENCOUNTER — Other Ambulatory Visit: Payer: Self-pay | Admitting: *Deleted

## 2020-08-28 DIAGNOSIS — I10 Essential (primary) hypertension: Secondary | ICD-10-CM

## 2020-08-28 DIAGNOSIS — E0810 Diabetes mellitus due to underlying condition with ketoacidosis without coma: Secondary | ICD-10-CM

## 2020-08-28 MED ORDER — FUROSEMIDE 20 MG PO TABS: 20.0000 mg | ORAL_TABLET | Freq: Every day | ORAL | 0 refills | Status: DC | PRN

## 2020-08-28 MED ORDER — BUPROPION HCL ER (XL) 300 MG PO TB24: 300.0000 mg | ORAL_TABLET | Freq: Every day | ORAL | 2 refills | Status: DC

## 2020-08-28 MED ORDER — OXYBUTYNIN CHLORIDE 5 MG PO TABS
5.0000 mg | ORAL_TABLET | Freq: Two times a day (BID) | ORAL | 1 refills | Status: DC
  Filled 2021-07-26: qty 60, 30d supply, fill #0
  Filled 2021-08-19: qty 60, 30d supply, fill #1

## 2020-08-28 MED ORDER — ACCU-CHEK AVIVA PLUS VI STRP: 1.0000 | ORAL_STRIP | 7 refills | Status: DC

## 2020-08-28 MED ORDER — AMLODIPINE BESYLATE 5 MG PO TABS
ORAL_TABLET | ORAL | 5 refills | Status: DC
  Filled 2021-03-11: qty 30, 30d supply, fill #0

## 2020-08-28 NOTE — Telephone Encounter (Signed)
Scripts re-sent to correct pharmacy. Apologize for the inconvenience.   Fayette Pho, MD

## 2020-08-28 NOTE — Telephone Encounter (Signed)
Patient called CCM RN stating that she hasn't received her [facility] yet [facility]. Scripts were filled on 08-21-20 but sent to wrong pharmacy.  Will ask provider to resent scripts to [facility] and also to please sign pended DME order in this encounter for incontinence supplies.  Let me know when orders are signed and I will get the DME order to [employer].  Thanks [employer]

## 2020-08-29 ENCOUNTER — Other Ambulatory Visit: Payer: Self-pay | Admitting: Family Medicine

## 2020-08-29 ENCOUNTER — Telehealth: Payer: Self-pay | Admitting: *Deleted

## 2020-08-29 DIAGNOSIS — E0810 Diabetes mellitus due to underlying condition with ketoacidosis without coma: Secondary | ICD-10-CM

## 2020-08-29 DIAGNOSIS — I10 Essential (primary) hypertension: Secondary | ICD-10-CM

## 2020-08-29 MED ORDER — ACCU-CHEK AVIVA PLUS VI STRP: 1.0000 | ORAL_STRIP | Freq: Three times a day (TID) | 7 refills | Status: DC

## 2020-08-29 NOTE — Chronic Care Management (AMB) (Signed)
Chronic Care Management   CCM RN Visit Note  08/29/2020 Name: [name] MRN: [id number] DOB: 1965-01-28  Subjective: [name] is a 55 y.o. year old female who is a primary care patient of Sara Essex, MD. The care management team was consulted for assistance with disease management and care coordination needs.    Engaged with patient face to face for follow up visit in response to provider referral for case management and/or care coordination services.   Consent to Services:  The patient was given information about Chronic Care Management services, agreed to services, and gave verbal consent prior to initiation of services.  Please see initial visit note for detailed documentation.   Patient agreed to services and verbal consent obtained.    Assessment: Patient continues to experience difficulty with checking her blood sugars and blood pressure.. See Care Plan below for interventions and patient self-care actives. Follow up Plan: Patient would like continued follow-up.  CCM RNCM will outreach the patient within the next 5 weeks  Patient will call office if needed prior to next encounter Review of patient past medical history, allergies, medications, health status, including review of consultants reports, laboratory and other test data, was performed as part of comprehensive evaluation and provision of chronic care management services.   SDOH (Social Determinants of Health) assessments and interventions performed:    CCM Care Plan  Allergies  Allergen Reactions   Apple Other (See Comments)    "mouth itch" - lumps on tongue   Banana Other (See Comments)    "whelps on tongue"   Metformin And Related Nausea Only   Shrimp [Shellfish Allergy] Nausea And Vomiting and Swelling    Outpatient Encounter Medications as of 08/28/2020  Medication Sig Note   Accu-Chek Softclix Lancets lancets USE AS INSTRUCTED    acetaminophen (TYLENOL) 500 MG tablet Take 500 mg by mouth every  6 (six) hours as needed.    albuterol (PROAIR HFA) 108 (90 Base) MCG/ACT inhaler Inhale 2 puffs into the lungs every 4 (four) hours as needed.    atorvastatin (LIPITOR) 20 MG tablet Take 1 tablet (20 mg total) by mouth daily. 04/15/2020: Patient has not started taking it yet   Blood Glucose Monitoring Suppl (ACCU-CHEK AVIVA PLUS) w/Device KIT 1 each by Does not apply route 3 (three) times daily after meals.    budesonide-formoterol (SYMBICORT) 160-4.5 MCG/ACT inhaler Inhale 2 puffs into the lungs 2 (two) times daily.    cetirizine (ZYRTEC) 10 MG tablet Take 1 tablet (10 mg total) by mouth daily.    cloNIDine (CATAPRES - DOSED IN MG/24 HR) 0.3 mg/24hr patch Place 1 patch (0.3 mg total) onto the skin once a week.    cloNIDine (CATAPRES) 0.1 MG tablet PLEASE SEE ATTACHED FOR DETAILED DIRECTIONS (Patient taking differently: Take 0.1 mg by mouth daily as needed (in between patches).)    Dulaglutide (TRULICITY) 1.5 WP/7.9YI SOPN Inject 1.5 mg into the skin once a week. Take on Sunday    fluticasone (FLONASE) 50 MCG/ACT nasal spray Place 2 sprays into both nostrils daily.    [DISCONTINUED] ACCU-CHEK AVIVA PLUS test strip USE AS INSTRUCTED    [DISCONTINUED] amLODipine (NORVASC) 5 MG tablet TAKE 1 TABLET BY MOUTH EVERYDAY AT BEDTIME    [DISCONTINUED] buPROPion (WELLBUTRIN XL) 300 MG 24 hr tablet TAKE 1 TABLET BY MOUTH EVERY DAY    [DISCONTINUED] furosemide (LASIX) 20 MG tablet TAKE 1 TABLET BY MOUTH EVERY DAY AS NEEDED 08/28/2020: Needs refills   hydrochlorothiazide (HYDRODIURIL) 25 MG tablet  Take 1 tablet (25 mg total) by mouth daily.    insulin glargine (LANTUS SOLOSTAR) 100 UNIT/ML Solostar Pen Inject 120 Units into the skin at bedtime.    Insulin Pen Needle (PEN NEEDLES 3/16") 31G X 5 MM MISC Use with Lantus solostar    montelukast (SINGULAIR) 10 MG tablet Take 1 tablet (10 mg total) by mouth at bedtime.    omeprazole (PRILOSEC) 40 MG capsule Take 1 capsule (40 mg total) by mouth daily.    oxycodone  (OXY-IR) 5 MG capsule Take 1 capsule (5 mg total) by mouth every 6 (six) hours as needed.    polyethylene glycol (MIRALAX / GLYCOLAX) packet Take 17 g by mouth daily as needed for mild constipation.    pregabalin (LYRICA) 50 MG capsule TAKE 1 CAPSULE BY MOUTH TWICE A DAY    RESTASIS MULTIDOSE 0.05 % ophthalmic emulsion     [DISCONTINUED] oxybutynin (DITROPAN) 5 MG tablet TAKE 1 TABLET BY MOUTH TWICE A DAY    No facility-administered encounter medications on file as of 08/28/2020.    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   Obstructive sleep apnea 10/30/2007   BMI 60.0-69.9, adult ([place]) 04/10/2007   MYASTHENIA 03/07/2007   Depression, recurrent ([place]) 03/24/2006   Essential (primary) hypertension 03/24/2006   Gastro-esophageal reflux disease without esophagitis 03/24/2006    Conditions to be addressed/monitored:HTN and DMII  Care Plan : RN Case Manager  Updates made by Sara Arms, RN since 08/29/2020 12:00 AM     Problem: Glycemic and Hypertention  Management   Priority: High  Onset Date: 04/15/2020  Note:   Objective:  Lab Results  Component Value Date   HGBA1C 9.8 (A) 04/09/2020   Lab Results  Component Value Date   CREATININE 0.72 04/09/2020   CREATININE 0.76 11/16/2019   CREATININE 0.73 09/27/2018   BP Readings from Last 3 Encounters:  05/16/20 (!) 136/94  05/05/20 (!) 143/101  04/09/20 (!) 183/109    Current Barriers:  Knowledge Deficits related to basic  Diabetes pathophysiology and self care/management Knowledge Deficits related to basic understanding of hypertension pathophysiology and self-care management Patient does not remember to take her medications  Case Manager Clinical Goal(s):  Over the next 90 days, patient will demonstrate improved adherence to prescribed treatment plan by taking all medications, for diabetes self care/management as evidenced by: lowering her a1c by 1-2 points and for hypertension as evidenced by taking all medications as prescribed, monitoring, and recording blood pressure as directed, adhering to low sodium/DASH diet   Interventions:  DM II Provided education to patient about basic DM disease process Reviewed medications with patient and discussed importance of medication adherence and way to remember to take her medications Discussed plans with patient for ongoing care management follow up and provided patient with direct contact information for care management team Review of patient status, including review of consultants reports, relevant laboratory and other test results, and medications completed. healthy lifestyle promoted Anticipate A1C testing (point-of-care) every 3 to 6 months based on goal attainment.  Review  mutually-set A1C goal or target range.  Anticipate use of antihyperglycemic with or without insulin and periodic adjustments; consider active involvement of pharmacist.  Compare self-reported symptoms of hypo or hyperglycemia to blood glucose levels, diet and fluid intake, current medications, psychosocial and physiologic stressors, change in activity and barriers to care adherence.  Promote self-monitoring of blood glucose levels.  Assess and address barriers to management plan, such as food taking medications daily.  modest weight loss (5 percent) promoted blood glucose monitoring encouraged blood glucose readings reviewed HTN Evaluation of current treatment plan related to hypertension self  management and patient's adherence to plan as established by provider. Provided education to patient re: stroke prevention, s/s of heart attack and stroke, DASH diet, complications of uncontrolled blood pressure Reviewed medications with patient and discussed importance of compliance Discussed plans with patient for ongoing care management follow up and provided patient with direct contact information for care management team Advised patient, providing education and rationale, to monitor blood pressure daily and record, calling PCP for findings outside established parameters. Discussed with the patient the importance of checking her BP and what can happen to her body with elevated BP Reviewed scheduled/upcoming provider appointments including: - pain assessed and managed-pain level 9/10 for her knees and right hip reduction of dietary sodium encouraged medication adherence assessment completed-patient states that she forgets to take her medications as prescribed, discussed options for with her to help her remember support and encouragement provided Spoke with the patient today and she states that she has been going through a rough period with her youngest son.   She has not been checking her blood sugars and BP as she should.  She states that she has kept her attitude positive by keeping her mind on her spiritual practices and not getting depressed.  She states that she is learning to "let go".  She said that she is going to refocus on her. Advised her to get back to her routine of checking her vitals and taking her meds as prescribed.  She stated that she did need a refill but had not heard anything back from the pharmacy because the office had not refilled the meds.  RNCM spoke with CMA at the office who help assist with the Issue.  RNCM called and left a message for the patient and let her know that her medications should be refilled soon.    Patient Goals/Self Care Activities:  Patient verbalizes  understanding of plan Self-administers medications as prescribed Calls pharmacy for medication refills Call's provider office for new concerns or questions Check blood sugar at prescribed times Check blood sugar if I feel it is too high or too low Checks BP and records as discussed Follows a low sodium diet/DASH diet       Sara Arms RN, BSN, [facility] Care Management Coordinator [place] Phone: [phone] I Fax: [phone]

## 2020-08-29 NOTE — Telephone Encounter (Signed)
I have resent the script with patient sig indicating TID use.   Fayette Pho, MD

## 2020-08-29 NOTE — Patient Instructions (Signed)
Visit Information  [name]  it was nice speaking with you. Please call me directly [phone] if you have questions about the goals we discussed.   Goals Addressed             This Visit's Progress    Monitor and Manage My Blood Sugar-Diabetes Type 2       Timeframe:  Long-Range Goal Priority:  High Start Date:    04/15/20                         Expected End Date:    09/24/20                - check blood sugar at prescribed times - take the blood sugar log to all doctor visits    Why is this important?   Checking your blood sugar at home helps to keep it from getting very high or very low.  Writing the results in a diary or log helps the doctor know how to care for you.  Your blood sugar log should have the time, date and the results.  Also, write down the amount of insulin or other medicine that you take.  Other information, like what you ate, exercise done and how you were feeling, will also be helpful.     Notes:      Track and Manage My Blood Pressure-Hypertension       Timeframe:  Long-Range Goal Priority:  High Start Date:   04/15/20                          Expected End Date:  09/24/20               - check blood pressure daily - choose a place to take my blood pressure (home, clinic or office, retail store) - write blood pressure results in a log or diary    Why is this important?   You won't feel high blood pressure, but it can still hurt your blood vessels.  High blood pressure can cause heart or kidney problems. It can also cause a stroke.  Making lifestyle changes like losing a little weight or eating less salt will help.  Checking your blood pressure at home and at different times of the day can help to control blood pressure.  If the doctor prescribes medicine remember to take it the way the doctor ordered.  Call the office if you cannot afford the medicine or if there are questions about it.     Notes:         The patient verbalized  understanding of instructions, educational materials, and care plan provided today and declined offer to receive copy of patient instructions, educational materials, and care plan.   Follow up Plan: Patient would like continued follow-up.  CCM RNCM will outreach the patient within the next 5 weeks  Patient will call office if needed prior to next encounter  Juanell Fairly, RN  [phone]

## 2020-08-29 NOTE — Telephone Encounter (Signed)
Received fax from pharmacy requesting that Sig: for test strips must include frequency of use. Bertram Haddix Zimmerman Rumple, CMA

## 2020-09-02 ENCOUNTER — Ambulatory Visit: Payer: Medicaid Other | Admitting: Psychology

## 2020-09-04 ENCOUNTER — Ambulatory Visit: Payer: Medicaid Other | Admitting: Psychology

## 2020-09-04 ENCOUNTER — Encounter: Payer: Self-pay | Admitting: Pharmacist

## 2020-09-04 ENCOUNTER — Ambulatory Visit (INDEPENDENT_AMBULATORY_CARE_PROVIDER_SITE_OTHER): Payer: Medicaid Other | Admitting: Pharmacist

## 2020-09-04 ENCOUNTER — Other Ambulatory Visit: Payer: Self-pay

## 2020-09-04 DIAGNOSIS — E1169 Type 2 diabetes mellitus with other specified complication: Secondary | ICD-10-CM | POA: Insufficient documentation

## 2020-09-04 DIAGNOSIS — I10 Essential (primary) hypertension: Secondary | ICD-10-CM | POA: Diagnosis not present

## 2020-09-04 DIAGNOSIS — J45909 Unspecified asthma, uncomplicated: Secondary | ICD-10-CM

## 2020-09-04 DIAGNOSIS — E1165 Type 2 diabetes mellitus with hyperglycemia: Secondary | ICD-10-CM | POA: Diagnosis not present

## 2020-09-04 DIAGNOSIS — Z794 Long term (current) use of insulin: Secondary | ICD-10-CM

## 2020-09-04 DIAGNOSIS — E785 Hyperlipidemia, unspecified: Secondary | ICD-10-CM

## 2020-09-04 MED ORDER — BUDESONIDE-FORMOTEROL FUMARATE 160-4.5 MCG/ACT IN AERO
2.0000 | INHALATION_SPRAY | Freq: Two times a day (BID) | RESPIRATORY_TRACT | 8 refills | Status: DC
  Filled 2021-03-11: qty 10.2, 30d supply, fill #0
  Filled 2021-07-26: qty 10.2, 30d supply, fill #1
  Filled 2021-08-19: qty 10.2, 30d supply, fill #2

## 2020-09-04 MED ORDER — TOUJEO MAX SOLOSTAR 300 UNIT/ML ~~LOC~~ SOPN: 120.0000 [IU] | PEN_INJECTOR | Freq: Every day | SUBCUTANEOUS | 96 refills | Status: DC

## 2020-09-04 MED ORDER — ATORVASTATIN CALCIUM 40 MG PO TABS: 40.0000 mg | ORAL_TABLET | Freq: Every day | ORAL | 1 refills | Status: DC

## 2020-09-04 MED ORDER — IRBESARTAN 150 MG PO TABS
150.0000 mg | ORAL_TABLET | Freq: Every day | ORAL | 1 refills | Status: DC
  Filled 2021-07-26: qty 90, 90d supply, fill #0

## 2020-09-04 NOTE — Progress Notes (Signed)
S:    Patient arrives in good spirits with her son and in a wheel chair.    Presents to the clinic for hypertension evaluation, counseling, and management.  Patient was referred and last seen by Primary Care Provider Dr. Selena Batten on 05/16/2020.  Patient last seen by PCP and was referred on 05/16/2020 but has not showed up to previous appointments.  Regarding her breathing, patient states she is winded quickly, exerted quickly, and requires assistance getting up stairs. However, she can go to bedroom to bathroom without assistance. Can't prepare meals. Aid in home. Trouble standing, legs limiting factor. She has been out of her Symbicort for months. She takes albuterol once every other month. [facility].   Patient also endorses frustration with her pain regimen. Reports a pain score 15/10. She has not had oxycodone since May. She was referred to the pain clinic and reports does not wish to pursue her pain care with them. Previously had transportation issues, no longer a problem now that son can drive her. She reports she does not like asking for pain meds d/t sterotype or fear of OD.  Medication adherence variable .  Current BP Medications include:  amlodipine 5 mg, clonidine 0.3 mg/24hr patch, clonidine 0.1 mg tab daily PRN when out of patches, HCTZ 25 mg daily  Antihypertensives tried in the past include: lisinopril-HCTZ 20-12.5  Dietary habits include: Fish, pork chops, chicken Vegetables: corn, peas, mixed vegetable, cabbage, greens Carbs: rice, potatoes, noodles (typically with every meal) Largest portion of meal is vegetable, then carbs, and meat is the smallest portion.  [place]: 6 Oreo's/day, cobbler Beverages: sweet tea, diet Mtn Dew, diet cherry Dr. Reino Kent Exercise habits include: sits most of the day  ASCVD risk factors include: hyperlipidemia, diabetes, hypertension, and inactive lifestyle   O:  Physical Exam Constitutional:       Appearance: Normal appearance. She is obese.  Cardiovascular:     Rate and Rhythm: Tachycardia present.  Pulmonary:     Effort: Pulmonary effort is normal.  Psychiatric:        Mood and Affect: Mood normal.        Behavior: Behavior normal.    Review of Systems  All other systems reviewed and are negative.  Home BP readings: Home BP havent checked in ~1 mo (d/t life issues), willing to start rechecking. Most recent reading in June was  155/95. Other reading range 127-174/ 71-106 Attributes elevated BP to pain (out of oxycodone for few months).   Last 3 Office BP readings: BP Readings from Last 3 Encounters:  05/16/20 (!) 136/94  05/05/20 (!) 143/101  04/09/20 (!) 183/109    BMET    Component Value Date/Time   NA 142 04/09/2020 1725   K 3.7 04/09/2020 1725   CL 102 04/09/2020 1725   CO2 18 (L) 04/09/2020 1725   GLUCOSE 189 (H) 04/09/2020 1725   GLUCOSE 194 (H) 11/16/2019 1413   BUN 8 04/09/2020 1725   CREATININE 0.72 04/09/2020 1725   CREATININE 0.75 11/27/2015 1408   CALCIUM 9.3 04/09/2020 1725   GFRNONAA >60 11/16/2019 1413   GFRNONAA >89 11/27/2015 1408   GFRAA 110 09/27/2018 1655   GFRAA >89 11/27/2015 1408     Clinical ASCVD: No  The 10-year ASCVD risk score Denman George DC Jr., et al., 2013) is: 18.6%   Values used to calculate the score:     Age: 55 years     Sex: Female  Is Non-Hispanic African American: Yes     Diabetic: Yes     Tobacco smoker: No     Systolic Blood Pressure: 136 mmHg     Is BP treated: Yes     HDL Cholesterol: 33 mg/dL     Total Cholesterol: 168 mg/dL   A/P: Hypertension currently uncontrolled. Possibly due to pain and medication adherence (limited access to clonidine). BP Goal = < 130/80 mmHg. Medication adherence variable due to dispensing pharmacy issues.  -Added Irbesartan 150 mg daily.  -Discontinued clonidine 0.3 mg patch and 0.1 mg tabs  Hypercholesterolemia chronic currently tolerating atorvastatin 20mg  once daily.   -Increase atorvastatin to 40 mg once daily  -Counseled on lifestyle modifications for blood pressure control including increased exercise and cutting down on sweet tea  Diabetes Patient on large volume of insulin, requiring multiple Lantus injections a day. Switch to concentrated for patient comfort and to decrease number of injections -Discontinue Lantus (insulin glargine) 120 units daily once out of home supply -Start Toujeo Max Solostar (insulin glargine) 120  units   Pain chronic - knee and back rated as 7/10 today in office.  No longer taking any narcotic due to running out of supply.  Did not like pain management and prefers to be seen and managed at [facility].  No request for narcotic today. No drug seeking behavior observed.  -Continue current treatment regimen including acetaminophen, diclofenac and ibuprofen.  -Patient may benefit starting meloxicam at visit with PCP. Recommend DC'ing ibuprofen and diclofenac at that time.    Results reviewed and written information provided.   Total time in face-to-face counseling 60 minutes.   F/U with PCP, [facility], in 2-3 weeks.

## 2020-09-04 NOTE — Assessment & Plan Note (Addendum)
Patient on large volume of insulin, requiring multiple Lantus injections a day. Switch to concentrated for patient comfort and to decrease number of injections -Discontinue Lantus (insulin glargine) 120 units daily once out of home supply -Start Toujeo Max Solostar (insulin glargine) 120  units

## 2020-09-04 NOTE — Patient Instructions (Signed)
It was nice to see you today!  Your goal blood pressure is <130/80 mmHg. In clinic, your blood pressure was 132/102 mmHg.  Medication Changes: Begin Toujeo 120 units subcutaneously at bedtime Begin Irbesartan 150 mg daily  Increase atorvastatin to 40 mg once daily   Discontinue clonidine patch and pill Finish your home supply of Lantus (insulin glargine) and start Toujeo once out of Lantus   Monitor blood pressure at home daily and keep a log (on your phone or piece of paper) to bring with you to your next visit. Write down date, time, blood pressure and pulse.  Keep up the good work with diet and exercise. Aim for a diet full of vegetables, fruit and lean meats (chicken, [place], fish). Try to limit salt intake by eating fresh or frozen vegetables (instead of canned), rinse canned vegetables prior to cooking and do not add any additional salt to meals.   Follow up with PCP Dr. Larita Fife in 2-3 weeks.

## 2020-09-04 NOTE — Assessment & Plan Note (Signed)
Hypertension currently uncontrolled. Possibly due to pain and medication adherence (limited access to clonidine). BP Goal = < 130/80 mmHg. Medication adherence variable due to dispensing pharmacy issues.  -Added Irbesartan 150 mg daily.  -Discontinued clonidine 0.3 mg patch and 0.1 mg tabs

## 2020-09-05 NOTE — Progress Notes (Signed)
Reviewed: I agree with Dr. Koval's documentation and management. 

## 2020-09-06 ENCOUNTER — Encounter: Payer: Self-pay | Admitting: Family Medicine

## 2020-09-09 ENCOUNTER — Telehealth: Payer: Self-pay

## 2020-09-09 NOTE — Telephone Encounter (Signed)
Prior approval for Toujeo completed via [employer].  Med approved for 09/09/2020 - 03/08/2021  Prior approval # [id number].  [facility] pharmacy informed.    Veronda Prude, RN

## 2020-09-23 ENCOUNTER — Ambulatory Visit: Payer: Medicaid Other | Admitting: Family Medicine

## 2020-09-26 ENCOUNTER — Other Ambulatory Visit: Payer: Self-pay | Admitting: Family Medicine

## 2020-09-26 DIAGNOSIS — E0810 Diabetes mellitus due to underlying condition with ketoacidosis without coma: Secondary | ICD-10-CM

## 2020-09-26 DIAGNOSIS — I10 Essential (primary) hypertension: Secondary | ICD-10-CM

## 2020-10-03 ENCOUNTER — Ambulatory Visit: Payer: Medicaid Other

## 2020-10-03 NOTE — Chronic Care Management (AMB) (Signed)
   RN Case Manager Care Management   Phone Outreach    10/03/2020 Name: [name] MRN: [id number] DOB: 20-Mar-1965  [name] is a 55 y.o. year old female who is a primary care patient of Fayette Pho, MD .   Unable to keep phone appointment today and requested to reschedule.  Plan:Will reach out to patient again in the next 10/08/20 at 130 pm .   Review of patient status, including review of consultants reports, relevant laboratory and other test results, and collaboration with appropriate care team members and the patient's provider was performed as part of comprehensive patient evaluation and provision of care management services.    Juanell Fairly RN, BSN, [facility] Care Management Coordinator [facility] Phone: [phone] I Fax: [phone]

## 2020-10-08 ENCOUNTER — Telehealth: Payer: Medicaid Other

## 2020-10-14 ENCOUNTER — Telehealth: Payer: Self-pay

## 2020-10-14 ENCOUNTER — Telehealth: Payer: Medicaid Other

## 2020-10-14 NOTE — Telephone Encounter (Signed)
   RN Case Manager Care Management   Phone Outreach    10/14/2020 Name: [name] MRN: [id number] DOB: 1965-02-16  [name] is a 55 y.o. year old female who is a primary care patient of Fayette Pho, MD .   Telephone outreach was unsuccessful A HIPPA compliant phone message was left for the patient providing contact information and requesting a return call.   Plan:Will route chart to Care Guide to see if patient would like to reschedule phone appointment   Review of patient status, including review of consultants reports, relevant laboratory and other test results, and collaboration with appropriate care team members and the patient's provider was performed as part of comprehensive patient evaluation and provision of care management services.    Juanell Fairly RN, BSN, [facility] Care Management Coordinator [facility] Phone: [phone] I Fax: [phone]

## 2020-10-17 ENCOUNTER — Ambulatory Visit (INDEPENDENT_AMBULATORY_CARE_PROVIDER_SITE_OTHER): Payer: Medicaid Other | Admitting: Family Medicine

## 2020-10-17 ENCOUNTER — Other Ambulatory Visit: Payer: Self-pay

## 2020-10-17 ENCOUNTER — Encounter: Payer: Self-pay | Admitting: Family Medicine

## 2020-10-17 VITALS — BP 132/94 | HR 121 | Ht 63.0 in | Wt 351.2 lb

## 2020-10-17 DIAGNOSIS — M25562 Pain in left knee: Secondary | ICD-10-CM | POA: Diagnosis not present

## 2020-10-17 DIAGNOSIS — Z23 Encounter for immunization: Secondary | ICD-10-CM

## 2020-10-17 DIAGNOSIS — Z794 Long term (current) use of insulin: Secondary | ICD-10-CM | POA: Diagnosis not present

## 2020-10-17 DIAGNOSIS — M174 Other bilateral secondary osteoarthritis of knee: Secondary | ICD-10-CM | POA: Diagnosis not present

## 2020-10-17 DIAGNOSIS — M25561 Pain in right knee: Secondary | ICD-10-CM | POA: Diagnosis not present

## 2020-10-17 DIAGNOSIS — E1165 Type 2 diabetes mellitus with hyperglycemia: Secondary | ICD-10-CM | POA: Diagnosis not present

## 2020-10-17 DIAGNOSIS — Z1231 Encounter for screening mammogram for malignant neoplasm of breast: Secondary | ICD-10-CM | POA: Diagnosis present

## 2020-10-17 DIAGNOSIS — Z0000 Encounter for general adult medical examination without abnormal findings: Secondary | ICD-10-CM

## 2020-10-17 DIAGNOSIS — G8929 Other chronic pain: Secondary | ICD-10-CM

## 2020-10-17 DIAGNOSIS — Z1159 Encounter for screening for other viral diseases: Secondary | ICD-10-CM

## 2020-10-17 LAB — POCT GLYCOSYLATED HEMOGLOBIN (HGB A1C): HbA1c, POC (controlled diabetic range): 10.3 % — AB (ref 0.0–7.0)

## 2020-10-17 MED ORDER — PREGABALIN 50 MG PO CAPS: 50.0000 mg | ORAL_CAPSULE | Freq: Two times a day (BID) | ORAL | 0 refills | Status: DC

## 2020-10-17 MED ORDER — DICLOFENAC SODIUM 50 MG PO TBEC
50.0000 mg | DELAYED_RELEASE_TABLET | Freq: Two times a day (BID) | ORAL | 0 refills | Status: DC | PRN
Start: 2020-10-17 — End: 2021-11-16
  Filled 2021-09-22: qty 60, 30d supply, fill #0

## 2020-10-17 MED ORDER — OXYCODONE HCL 5 MG PO CAPS: 5.0000 mg | ORAL_CAPSULE | Freq: Four times a day (QID) | ORAL | 0 refills | Status: DC | PRN

## 2020-10-17 NOTE — Progress Notes (Signed)
    SUBJECTIVE:   CHIEF COMPLAINT / HPI:   Chronic pain - located mainly in knees - also located shoulder, back, hands - currently taking lyrica, oxy, and voltaren  - last fill of lyric 01/22/20 pregabalin 50 mg #60 tabs (takes it once daily when she remembers) - last fill of oxy IR 5 mg 06/03/20 last filled 06/03/20 - voltaren tablet every day 50 mg  - last Cr 0.72, no known history of kidney disease - previously referred to pain clinic by Dr. 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, insomnia  OBJECTIVE:   BP (!) 132/94   Pulse (!) 121   Ht 5\' 3"  (1.6 m)   Wt (!) 351 lb 3.2 oz (159.3 kg)   SpO2 95%   BMI 62.21 kg/m    PHQ-9:  Depression screen [facility] 2/9 10/17/2020 05/05/2020 10/12/2019  Decreased Interest 3 3 3   Down, Depressed, Hopeless 2 1 2   PHQ - 2 Score 5 4 5   Altered sleeping 3 3 3   Tired, decreased energy 3 3 3   Change in appetite 3 3 3   Feeling bad or failure about yourself  3 1 1   Trouble concentrating 2 3 2   Moving slowly or fidgety/restless 2 2 3   Suicidal thoughts 0 0 0  PHQ-9 Score 21 19 20   Difficult doing work/chores Very difficult Very difficult -  Some recent data might be hidden     GAD-7:  GAD 7 : Generalized Anxiety Score 08/08/2017  Nervous, Anxious, on Edge 3  Control/stop worrying 2  Worry too much - different things 3  Trouble relaxing 3  Restless 2  Easily annoyed or irritable 3  Afraid - awful might happen 2  Total GAD 7 Score 18     Physical Exam General: Awake, alert, oriented Cardiovascular: Regular rate and rhythm, S1 and S2 present, no murmurs auscultated Respiratory: Lung fields clear to auscultation bilaterally Extremities: Knees TTP medial>lateral, no effusion or warmth palpated, some TTP over bilateral patellar  tendons  ASSESSMENT/PLAN:   Encounter for chronic pain management Currently taking voltaren 50 mg PO daily, pregabalin 50 mg daily PRN, and oxy IR 5 mg PRN. She would prefer more functionality free from pain but does not want to add more medication. No known kidney disease, last Cr 0.72. - increase pregabalin 50 mg to daily  - use voltaren 50 mg tablet daily PRN as first line PRN - use oxy IR 5 mg PRN as second line PRN - follow up one month  Healthcare maintenance - referred to ophtho for T2DM eye exam  - ordered mammogram - flu vaccine administered today - shingles vaccine sent to pharmacy     , MD [facility]

## 2020-10-17 NOTE — Patient Instructions (Addendum)
It was wonderful to meet you today. Thank you for allowing me to be a part of your care. Below is a short summary of what we discussed at your visit today:  Pain regimen - Take the lyrica every day - Take the voltaren as needed as your first line pain medicine - Take the oxy IR as needed as your second line pain medicine  Health maintenance - I have referred you for colonoscopy. The GI office should be calling you to make an appointment - I have ordered your mammogram. Please call  the [facility] to schedule your mammogram at your convenience.  - I have sent your Shingles vaccine to your pharmacy and they will administer it for you  Please bring all of your medications to every appointment!  If you have any questions or concerns, please do not hesitate to contact us via phone or MyChart message.   Fayette Pho, MD

## 2020-10-21 ENCOUNTER — Ambulatory Visit: Payer: Medicaid Other

## 2020-10-21 DIAGNOSIS — Z0000 Encounter for general adult medical examination without abnormal findings: Secondary | ICD-10-CM | POA: Insufficient documentation

## 2020-10-21 NOTE — Chronic Care Management (AMB) (Signed)
Chronic Care Management   CCM RN Visit Note  10/21/2020 Name: [name] MRN: [id number] DOB: 04/21/1965  Subjective: [name] is a 55 y.o. year old female who is a primary care patient of Ezequiel Essex, MD. The care management team was consulted for assistance with disease management and care coordination needs.    Engaged with patient by telephone for follow up visit in response to provider referral for case management and/or care coordination services.   Consent to Services:  The patient was given information about Chronic Care Management services, agreed to services, and gave verbal consent prior to initiation of services.  Please see initial visit note for detailed documentation.   Patient agreed to services and verbal consent obtained.    Assessment:  The patient  continues to experience difficulty with elevated blood sugars and checking her viatl.. See Care Plan below for interventions and patient self-care actives. Follow up Plan: Patient would like continued follow-up.  CCM RNCM will outreach the patient within the next 4 weeks.  Patient will call office if needed prior to next encounter Review of patient past medical history, allergies, medications, health status, including review of consultants reports, laboratory and other test data, was performed as part of comprehensive evaluation and provision of chronic care management services.   SDOH (Social Determinants of Health) assessments and interventions performed:    CCM Care Plan  Allergies  Allergen Reactions   Apple Other (See Comments)    "mouth itch" - lumps on tongue   Banana Other (See Comments)    "whelps on tongue"   Metformin And Related Nausea Only   Shrimp [Shellfish Allergy] Nausea And Vomiting and Swelling    Outpatient Encounter Medications as of 10/21/2020  Medication Sig   Accu-Chek Softclix Lancets lancets USE AS INSTRUCTED   acetaminophen (TYLENOL) 500 MG tablet Take 500 mg by mouth  every 6 (six) hours as needed.   albuterol (PROAIR HFA) 108 (90 Base) MCG/ACT inhaler Inhale 2 puffs into the lungs every 4 (four) hours as needed.   amLODipine (NORVASC) 5 MG tablet TAKE 1 TABLET BY MOUTH EVERYDAY AT BEDTIME   atorvastatin (LIPITOR) 40 MG tablet Take 1 tablet (40 mg total) by mouth daily.   Blood Glucose Monitoring Suppl (ACCU-CHEK AVIVA PLUS) w/Device KIT 1 each by Does not apply route 3 (three) times daily after meals.   budesonide-formoterol (SYMBICORT) 160-4.5 MCG/ACT inhaler Inhale 2 puffs into the lungs 2 (two) times daily.   buPROPion (WELLBUTRIN XL) 300 MG 24 hr tablet Take 1 tablet (300 mg total) by mouth daily.   cetirizine (ZYRTEC) 10 MG tablet Take 1 tablet (10 mg total) by mouth daily.   diclofenac (VOLTAREN) 50 MG EC tablet Take 1 tablet (50 mg total) by mouth 2 (two) times daily as needed.   Dulaglutide (TRULICITY) 1.5 OJ/5.0KX SOPN Inject 1.5 mg into the skin once a week. Take on Sunday   furosemide (LASIX) 20 MG tablet TAKE 1 TABLET BY MOUTH EVERY DAY AS NEEDED   glucose blood (ACCU-CHEK AVIVA PLUS) test strip 1 each by Other route in the morning, at noon, and at bedtime. Use three times daily as directed.   hydrochlorothiazide (HYDRODIURIL) 25 MG tablet Take 1 tablet (25 mg total) by mouth daily.   insulin glargine, 2 Unit Dial, (TOUJEO MAX SOLOSTAR) 300 UNIT/ML Solostar Pen Inject 120 Units into the skin daily. Quantity sufficient for 1 month supply.   Insulin Pen Needle (PEN NEEDLES 3/16") 31G X 5 MM MISC Use with  Lantus solostar   irbesartan (AVAPRO) 150 MG tablet Take 1 tablet (150 mg total) by mouth at bedtime.   montelukast (SINGULAIR) 10 MG tablet Take 1 tablet (10 mg total) by mouth at bedtime.   omeprazole (PRILOSEC) 40 MG capsule Take 1 capsule (40 mg total) by mouth daily.   oxybutynin (DITROPAN) 5 MG tablet Take 1 tablet (5 mg total) by mouth 2 (two) times daily.   oxycodone (OXY-IR) 5 MG capsule Take 1 capsule (5 mg total) by mouth every 6 (six)  hours as needed.   pregabalin (LYRICA) 50 MG capsule Take 1 capsule (50 mg total) by mouth 2 (two) times daily. Once if you're getting too drowsy with twice daily.   RESTASIS MULTIDOSE 0.05 % ophthalmic emulsion    No facility-administered encounter medications on file as of 10/21/2020.    Patient Active Problem List   Diagnosis Date Noted   Healthcare maintenance 10/21/2020   Hyperlipidemia associated with type 2 diabetes mellitus ([place]) 09/04/2020   Wheelchair dependence 05/05/2020   Carpal tunnel syndrome of right wrist, RECURRENT 10/15/2019   Insomnia, idiopathic 09/30/2018   Grade I diastolic dysfunction 49/44/9675   Encounter for chronic pain management 02/11/2014   Diabetes ([place]) 02/11/2014   Cystocele 09/14/2012   Chronic pain of both knees 05/28/2010   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   Allergic rhinitis 05/30/2008   OBESITY HYPOVENTILATION SYNDROME 12/27/2007   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   Obstructive sleep apnea 10/30/2007   BMI 60.0-69.9, adult ([place]) 04/10/2007   MYASTHENIA 03/07/2007   Depression, recurrent ([place]) 03/24/2006   Essential (primary) hypertension 03/24/2006   Gastro-esophageal reflux disease without esophagitis 03/24/2006    Conditions to be addressed/monitored:HTN and DMII  Care Plan : RN Case Manager  Updates made by [name], RN since 10/21/2020 12:00 AM     Problem: Glycemic and Hypertention  Management   Priority: High  Onset Date: 04/15/2020  Note:   Objective:  Lab Results  Component Value Date   HGBA1C 10.3 (A) 10/17/2020   Lab Results  Component Value Date   CREATININE 0.72 04/09/2020   CREATININE 0.76 11/16/2019   CREATININE 0.73 09/27/2018   BP Readings from Last 3 Encounters:  10/17/20 (!) 132/94  09/04/20 (!) 132/102  05/16/20 (!) 136/94    Current Barriers:  Knowledge Deficits related to basic Diabetes pathophysiology and self care/management Knowledge Deficits related to  basic understanding of hypertension pathophysiology and self-care management Patient does not remember to take her medications  Case Manager Clinical Goal(s):  Over the next 90 days, patient will demonstrate improved adherence to prescribed treatment plan by taking all medications, for diabetes self care/management as evidenced by: lowering her a1c by 1-2 points and for hypertension as evidenced by taking all medications as prescribed, monitoring, and recording blood pressure as directed, adhering to low sodium/DASH diet   Interventions:  DM II Provided education to patient about basic DM disease process Reviewed medications with patient and discussed importance of medication adherence and way to remember to take her medications Discussed plans with patient for ongoing care management follow up and provided patient with direct contact information for care management team Review of patient status, including review of consultants reports, relevant laboratory and other test results, and medications completed. healthy lifestyle promoted Anticipate A1C testing (point-of-care) every 3 to 6 months based on goal attainment.  Review mutually-set A1C goal or target range.  Anticipate use of antihyperglycemic with or without insulin and periodic adjustments; consider active involvement of  pharmacist.  Compare self-reported symptoms of hypo or hyperglycemia to blood glucose levels, diet and fluid intake, current medications, psychosocial and physiologic stressors, change in activity and barriers to care adherence.  Promote self-monitoring of blood glucose levels.  Assess and address barriers to management plan, such as food taking medications daily.  modest weight loss (5 percent) promoted blood glucose monitoring encouraged blood glucose readings reviewed HTN Evaluation of current treatment plan related to hypertension self management and patient's adherence to plan as established by provider. Provided  education to patient re: stroke prevention, s/s of heart attack and stroke, DASH diet, complications of uncontrolled blood pressure Reviewed medications with patient and discussed importance of compliance Discussed plans with patient for ongoing care management follow up and provided patient with direct contact information for care management team Advised patient, providing education and rationale, to monitor blood pressure daily and record, calling PCP for findings outside established parameters. Discussed with the patient the importance of checking her BP and what can happen to her body with elevated BP Reviewed scheduled/upcoming provider appointments including: - pain assessed and managed- reduction of dietary sodium encouraged medication adherence assessment completed-patient states that she forgets to take her medications as prescribed, discussed options for with her to help her remember support and encouragement provided 10/21/20:  I spoke with Ms. Eckersley, and she said she is doing well. She has not checked her blood pressure or blood sugars because of a situation that she has going on with her son; she feels overwhelmed. Advised the patient to try to remember to check her vitals. she has had a change in her medications, and she needs to see how they are working.  On a visit with Nicholas Lose, Pharm D, her Hypertension and diabetes medication change included_ Irbesartan 150 mg daily.  -Discontinued clonidine 0.3 mg patch and 0.1 mg tabs-Increase atorvastatin to 40 mg once daily. Discontinue Lantus (insulin glargine) 120 units,-Start Toujeo Max Solostar (insulin glargine) 120 units.      Patient Goals/Self Care Activities:  Patient verbalizes understanding of plan Self-administers medications as prescribed Calls pharmacy for medication refills Call's provider office for new concerns or questions Check blood sugar at prescribed times Check blood sugar if I feel it is too high or too  low Checks BP and records as discussed Follows a low sodium diet/DASH diet       [name] RN, BSN, [facility] Care Management Coordinator [place] Phone: [phone] I Fax: [phone]

## 2020-10-21 NOTE — Patient Instructions (Signed)
Visit Information  [name]  it was nice speaking with you. Please call me directly [phone] if you have questions about the goals we discussed.   Goals Addressed             This Visit's Progress    Monitor and Manage My Blood Sugar-Diabetes Type 2       Timeframe:  Long-Range Goal Priority:  High Start Date:    04/15/20                         Expected End Date: 11/24/20          - check blood sugar at prescribed times - take the blood sugar log to all doctor visits    Why is this important?   Checking your blood sugar at home helps to keep it from getting very high or very low.  Writing the results in a diary or log helps the doctor know how to care for you.  Your blood sugar log should have the time, date and the results.  Also, write down the amount of insulin or other medicine that you take.  Other information, like what you ate, exercise done and how you were feeling, will also be helpful.     Notes:      Track and Manage My Blood Pressure-Hypertension       Timeframe:  Long-Range Goal Priority:  High Start Date:   04/15/20                          Expected End Date:  11/24/20           - check blood pressure daily - choose a place to take my blood pressure (home, clinic or office, retail store) - write blood pressure results in a log or diary    Why is this important?   You won't feel high blood pressure, but it can still hurt your blood vessels.  High blood pressure can cause heart or kidney problems. It can also cause a stroke.  Making lifestyle changes like losing a little weight or eating less salt will help.  Checking your blood pressure at home and at different times of the day can help to control blood pressure.  If the doctor prescribes medicine remember to take it the way the doctor ordered.  Call the office if you cannot afford the medicine or if there are questions about it.     Notes:        The patient verbalizes understanding of the  information and instructions discussed today.  Our next appointment is scheduled for  11/24/20.   Please feel free to call me or the office if you have any questions or concerns.  Juanell Fairly RN, BSN, [facility] Care Management Coordinator [facility] Phone: [phone] I Fax: [phone]

## 2020-10-21 NOTE — Assessment & Plan Note (Signed)
-   referred to ophtho for T2DM eye exam  - ordered mammogram - flu vaccine administered today - shingles vaccine sent to pharmacy

## 2020-10-21 NOTE — Assessment & Plan Note (Signed)
Currently taking voltaren 50 mg PO daily, pregabalin 50 mg daily PRN, and oxy IR 5 mg PRN. She would prefer more functionality free from pain but does not want to add more medication. No known kidney disease, last Cr 0.72. - increase pregabalin 50 mg to daily  - use voltaren 50 mg tablet daily PRN as first line PRN - use oxy IR 5 mg PRN as second line PRN - follow up one month

## 2020-10-24 ENCOUNTER — Telehealth: Payer: Self-pay

## 2020-10-24 NOTE — Telephone Encounter (Signed)
Completed PA info in [facility] Tracks for Oxycodone.  Status pending.  Will recheck status in 24 hours. Veronda Prude, RN

## 2020-11-10 ENCOUNTER — Other Ambulatory Visit: Payer: Self-pay

## 2020-11-10 ENCOUNTER — Ambulatory Visit (INDEPENDENT_AMBULATORY_CARE_PROVIDER_SITE_OTHER): Payer: Medicaid Other | Admitting: Pharmacist

## 2020-11-10 DIAGNOSIS — E1165 Type 2 diabetes mellitus with hyperglycemia: Secondary | ICD-10-CM

## 2020-11-10 DIAGNOSIS — Z794 Long term (current) use of insulin: Secondary | ICD-10-CM | POA: Diagnosis not present

## 2020-11-10 MED ORDER — MOUNJARO 2.5 MG/0.5ML ~~LOC~~ SOAJ: 5.0000 mg | SUBCUTANEOUS | 0 refills | Status: DC

## 2020-11-10 NOTE — Progress Notes (Signed)
Subjective:    Patient ID: [name], female    DOB: 1965/10/25, 55 y.o.   MRN: [id number]  HPI Patient is a 55 y.o. female who presents for diabetes management. She is in good spirits and presents with assistance. Patient was referred and last seen by Primary Care Provider on 09/04/20.  Patient reports diabetes was diagnosed around 4-5 years ago.   Insurance coverage/medication affordability: Medicaid  Current diabetes medications include: Toujeo 300 unit/mL 120 units once daily, Trulicity 1.5mg  once weekly on Sundays Patient states that She is taking her medications as prescribed. Patient denies adherence with medications. Patient states that She misses her medications 2-3 times per month, on average.  Do you feel that your medications are working for you?  "I am not sure"  Have you been experiencing any side effects to the medications prescribed? Yes; Toujeo is causing me an increase in appetite  Do you have any problems obtaining medications due to transportation or finances?  no     Patient reports hypoglycemic events. Patient denies polyuria (increased urination).  Patient reports polyphagia (increased appetite).  Patient denies polydipsia (increased thirst).  Patient reports neuropathy (nerve pain). Patient reports visual changes. Patient denies self foot exams.   Home fasting blood sugars: 132   Home blood pressure readings: 115/74  Objective:   Labs:   Physical Exam Constitutional:      Appearance: She is obese.  Neurological:     Mental Status: She is alert and oriented to person, place, and time.    Review of Systems  Gastrointestinal:  Negative for nausea and vomiting.   Lab Results  Component Value Date   HGBA1C 10.3 (A) 10/17/2020   HGBA1C 9.8 (A) 04/09/2020   HGBA1C 9.0 (H) 11/16/2019    Lipid Panel     Component Value Date/Time   CHOL 168 04/09/2020 1725   TRIG 130 04/09/2020 1725   HDL 33 (L) 04/09/2020 1725   CHOLHDL 5.1 (H)  04/09/2020 1725   CHOLHDL 4.5 11/27/2015 1408   VLDL 27 11/27/2015 1408   LDLCALC 111 (H) 04/09/2020 1725    Clinical Atherosclerotic Cardiovascular Disease (ASCVD): No  The 10-year ASCVD risk score (Arnett DK, et al., 2019) is: 16.9%   Values used to calculate the score:     Age: 48 years     Sex: Female     Is Non-Hispanic African American: Yes     Diabetic: Yes     Tobacco smoker: No     Systolic Blood Pressure: 132 mmHg     Is BP treated: Yes     HDL Cholesterol: 33 mg/dL     Total Cholesterol: 168 mg/dL   Assessment/Plan:   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 Medication adherence appears sub-optimal. Provided patient sample of Mounjaro 2.5mg  and will discontinue Trulicity due to no improvement in A1C despite patient being on medicine for several months. Patient educated on purpose, proper use and potential adverse effects of Mounjaro.  Following instruction patient verbalized understanding of treatment plan.    Toujeo 300 unit/mL 120 units once daily, Trulicity 1.5mg  once weekly on Sundays  Continued basal insulin Toujeo 300 unit/mL 120 units once daily Discontinued GLP-1 Trulicity 1.5mg  and provided sample of Mounjaro 2.5mg  inject 2 pens once weekly to make 5mg  dose Extensively discussed pathophysiology of diabetes, dietary effects on blood sugar control, and recommended lifestyle interventions Counseled on s/sx of and management of hypoglycemia Next A1C anticipated December 2022.   Follow-up appointment one month  to review sugar readings. Written patient instructions provided.  This appointment required 30 minutes of direct patient care.  Thank you for involving pharmacy to assist in providing this patient's care.

## 2020-11-10 NOTE — Patient Instructions (Signed)
Ms. Georgina it was a pleasure seeing you today.   Please do the following:  Stop your Trulicity and take 2 pens of Mounjaro once weekly on Sundays as directed today during your appointment. If you have any questions or if you believe something has occurred because of this change, call me or your doctor to let one of [place] know.  Continue checking blood sugars at home. It's really important that you record these and bring these in to your next doctor's appointment.  Continue making the lifestyle changes we've discussed together during our visit. Diet and exercise play a significant role in improving your blood sugars.  Follow-up with me in three weeks.    Hypoglycemia or low blood sugar:   Low blood sugar can happen quickly and may become an emergency if not treated right away.   While this shouldn't happen often, it can be brought upon if you skip a meal or do not eat enough. Also, if your insulin or other diabetes medications are dosed too high, this can cause your blood sugar to go to low.   Warning signs of low blood sugar include: Feeling shaky or dizzy Feeling weak or tired  Excessive hunger Feeling anxious or upset  Sweating even when you aren't exercising  What to do if I experience low blood sugar? Follow the Rule of 15 Check your blood sugar with your meter. If lower than 70, proceed to step 2.  Treat with 15 grams of fast acting carbs which is found in 3-4 glucose tablets. If none are available you can try hard candy, 1 tablespoon of sugar or honey,4 ounces of fruit juice, or 6 ounces of REGULAR soda.  Re-check your sugar in 15 minutes. If it is still below 70, do what you did in step 2 again. If your blood sugar has come back up, go ahead and eat a snack or small meal made up of complex carbs (ex. Whole grains) and protein at this time to avoid recurrence of low blood sugar.

## 2020-11-18 ENCOUNTER — Ambulatory Visit: Payer: Medicaid Other

## 2020-11-18 NOTE — Patient Instructions (Signed)
Visit Information  [name]  it was nice speaking with you. Please call me directly [phone] if you have questions about the goals we discussed.   Goals Addressed             This Visit's Progress    Monitor and Manage My Blood Sugar-Diabetes Type 2       Timeframe:  Long-Range Goal Priority:  High Start Date:    04/15/20                         Expected End Date: 12/24/20     - check blood sugar at prescribed times - take the blood sugar log to all doctor visits    Why is this important?   Checking your blood sugar at home helps to keep it from getting very high or very low.  Writing the results in a diary or log helps the doctor know how to care for you.  Your blood sugar log should have the time, date and the results.  Also, write down the amount of insulin or other medicine that you take.  Other information, like what you ate, exercise done and how you were feeling, will also be helpful.     Notes:      Track and Manage My Blood Pressure-Hypertension       Timeframe:  Long-Range Goal Priority:  High Start Date:   04/15/20                          Expected End Date:  12/24/20  - check blood pressure daily - choose a place to take my blood pressure (home, clinic or office, retail store) - write blood pressure results in a log or diary    Why is this important?   You won't feel high blood pressure, but it can still hurt your blood vessels.  High blood pressure can cause heart or kidney problems. It can also cause a stroke.  Making lifestyle changes like losing a little weight or eating less salt will help.  Checking your blood pressure at home and at different times of the day can help to control blood pressure.  If the doctor prescribes medicine remember to take it the way the doctor ordered.  Call the office if you cannot afford the medicine or if there are questions about it.     Notes:         The patient verbalized understanding of instructions,  educational materials, and care plan provided today and declined offer to receive copy of patient instructions, educational materials, and care plan.   Follow up Plan: Patient would like continued follow-up.  CCM RNCM will outreach the patient within the next 5 weeks.  Patient will call office if needed prior to next encounter  Juanell Fairly, RN  [phone]

## 2020-11-18 NOTE — Chronic Care Management (AMB) (Signed)
Chronic Care Management   CCM RN Visit Note  11/18/2020 Name: [name] MRN: [id number] DOB: 07-Oct-1965  Subjective: [name] is a 55 y.o. year old female who is a primary care patient of Ezequiel Essex, MD. The care management team was consulted for assistance with disease management and care coordination needs.    Engaged with patient by telephone for follow up visit in response to provider referral for case management and/or care coordination services.   Consent to Services:  The patient was given information about Chronic Care Management services, agreed to services, and gave verbal consent prior to initiation of services.  Please see initial visit note for detailed documentation.   Patient agreed to services and verbal consent obtained.    Assessment:  [name]  is making progress with her conditions  . See Care Plan below for interventions and [name]-care actives. Follow up Plan: Patient would like continued follow-up.  CCM RNCM will outreach the patient within the next 5 weeks.  Patient will call office if needed prior to next encounter Review of patient past medical history, allergies, medications, health status, including review of consultants reports, laboratory and other test data, was performed as part of comprehensive evaluation and provision of chronic care management services.   SDOH (Social Determinants of Health) assessments and interventions performed:    CCM Care Plan  Allergies  Allergen Reactions   Apple Other (See Comments)    "mouth itch" - lumps on tongue   Banana Other (See Comments)    "whelps on tongue"   Metformin And Related Nausea Only   Shrimp [Shellfish Allergy] Nausea And Vomiting and Swelling    Outpatient Encounter Medications as of 11/18/2020  Medication Sig   Accu-Chek Softclix Lancets lancets USE AS INSTRUCTED   acetaminophen (TYLENOL) 500 MG tablet Take 500 mg by mouth every 6 (six) hours as needed.   albuterol  (PROAIR HFA) 108 (90 Base) MCG/ACT inhaler Inhale 2 puffs into the lungs every 4 (four) hours as needed. (Patient not taking: Reported on 11/10/2020)   amLODipine (NORVASC) 5 MG tablet TAKE 1 TABLET BY MOUTH EVERYDAY AT BEDTIME   atorvastatin (LIPITOR) 40 MG tablet Take 1 tablet (40 mg total) by mouth daily.   Blood Glucose Monitoring Suppl (ACCU-CHEK AVIVA PLUS) w/Device KIT 1 each by Does not apply route 3 (three) times daily after meals.   budesonide-formoterol (SYMBICORT) 160-4.5 MCG/ACT inhaler Inhale 2 puffs into the lungs 2 (two) times daily.   buPROPion (WELLBUTRIN XL) 300 MG 24 hr tablet Take 1 tablet (300 mg total) by mouth daily.   cetirizine (ZYRTEC) 10 MG tablet Take 1 tablet (10 mg total) by mouth daily.   diclofenac (VOLTAREN) 50 MG EC tablet Take 1 tablet (50 mg total) by mouth 2 (two) times daily as needed.   furosemide (LASIX) 20 MG tablet TAKE 1 TABLET BY MOUTH EVERY DAY AS NEEDED (Patient not taking: Reported on 11/10/2020)   glucose blood (ACCU-CHEK AVIVA PLUS) test strip 1 each by Other route in the morning, at noon, and at bedtime. Use three times daily as directed.   hydrochlorothiazide (HYDRODIURIL) 25 MG tablet Take 1 tablet (25 mg total) by mouth daily.   insulin glargine, 2 Unit Dial, (TOUJEO MAX SOLOSTAR) 300 UNIT/ML Solostar Pen Inject 120 Units into the skin daily. Quantity sufficient for 1 month supply.   Insulin Pen Needle (PEN NEEDLES 3/16") 31G X 5 MM MISC Use with Lantus solostar   irbesartan (AVAPRO) 150 MG tablet Take 1  tablet (150 mg total) by mouth at bedtime.   montelukast (SINGULAIR) 10 MG tablet Take 1 tablet (10 mg total) by mouth at bedtime.   omeprazole (PRILOSEC) 40 MG capsule Take 1 capsule (40 mg total) by mouth daily.   oxybutynin (DITROPAN) 5 MG tablet Take 1 tablet (5 mg total) by mouth 2 (two) times daily.   oxycodone (OXY-IR) 5 MG capsule Take 1 capsule (5 mg total) by mouth every 6 (six) hours as needed. (Patient not taking: Reported on  11/10/2020)   pregabalin (LYRICA) 50 MG capsule Take 1 capsule (50 mg total) by mouth 2 (two) times daily. Once if you're getting too drowsy with twice daily.   RESTASIS MULTIDOSE 0.05 % ophthalmic emulsion  (Patient not taking: Reported on 11/10/2020)   tirzepatide [facility]) 2.5 MG/0.5ML Pen Inject 5 mg into the skin once a week.   No facility-administered encounter medications on file as of 11/18/2020.    Patient Active Problem List   Diagnosis Date Noted   Healthcare maintenance 10/21/2020   Hyperlipidemia associated with type 2 diabetes mellitus ([place]) 09/04/2020   Wheelchair dependence 05/05/2020   Carpal tunnel syndrome of right wrist, RECURRENT 10/15/2019   Insomnia, idiopathic 09/30/2018   Grade I diastolic dysfunction 92/33/0076   Encounter for chronic pain management 02/11/2014   Diabetes ([place]) 02/11/2014   Cystocele 09/14/2012   Chronic pain of both knees 05/28/2010   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   Allergic rhinitis 05/30/2008   OBESITY HYPOVENTILATION SYNDROME 12/27/2007   Postinflammatory pulmonary fibrosis ([place]) 12/01/2007   Obstructive sleep apnea 10/30/2007   BMI 60.0-69.9, adult ([place]) 04/10/2007   MYASTHENIA 03/07/2007   Depression, recurrent ([place]) 03/24/2006   Essential (primary) hypertension 03/24/2006   Gastro-esophageal reflux disease without esophagitis 03/24/2006    Conditions to be addressed/monitored:HTN and DMII  Care Plan : RN Case Manager  Updates made by [name], RN since 11/18/2020 12:00 AM     Problem: Glycemic and Hypertention  Management   Priority: High  Onset Date: 04/15/2020  Note:   Objective:  Lab Results  Component Value Date   HGBA1C 10.3 (A) 10/17/2020   Lab Results  Component Value Date   CREATININE 0.72 04/09/2020   CREATININE 0.76 11/16/2019   CREATININE 0.73 09/27/2018   BP Readings from Last 3 Encounters:  10/17/20 (!) 132/94  09/04/20 (!) 132/102  05/16/20 (!) 136/94    Current  Barriers:  Knowledge Deficits related to basic Diabetes pathophysiology and [name] care/management Knowledge Deficits related to basic understanding of hypertension pathophysiology and [name]-care management Patient does not remember to take her medications  Case Manager Clinical Goal(s):  Over the next 90 days, patient will demonstrate improved adherence to prescribed treatment plan by taking all medications, for diabetes [name] care/management as evidenced by: lowering her a1c by 1-2 points and for hypertension as evidenced by taking all medications as prescribed, monitoring, and recording blood pressure as directed, adhering to low sodium/DASH diet   Interventions:  DM II Provided education to patient about basic DM disease process Reviewed medications with patient and discussed importance of medication adherence and way to remember to take her medications Discussed plans with patient for ongoing care management follow up and provided patient with direct contact information for care management team Review of patient status, including review of consultants reports, relevant laboratory and other test results, and medications completed. healthy lifestyle promoted Anticipate A1C testing (point-of-care) every 3 to 6 months based on goal attainment.  Review mutually-set A1C goal or target  range.  Anticipate use of antihyperglycemic with or without insulin and periodic adjustments; consider active involvement of pharmacist.  Compare [name]-reported symptoms of hypo or hyperglycemia to blood glucose levels, diet and fluid intake, current medications, psychosocial and physiologic stressors, change in activity and barriers to care adherence.  Promote [name]-monitoring of blood glucose levels.  Assess and address barriers to management plan, such as food taking medications daily.  modest weight loss (5 percent) promoted blood glucose monitoring encouraged blood glucose readings reviewed HTN Evaluation of  current treatment plan related to hypertension [name] management and patient's adherence to plan as established by provider. Provided education to patient re: stroke prevention, s/s of heart attack and stroke, DASH diet, complications of uncontrolled blood pressure Reviewed medications with patient and discussed importance of compliance Discussed plans with patient for ongoing care management follow up and provided patient with direct contact information for care management team Advised patient, providing education and rationale, to monitor blood pressure daily and record, calling PCP for findings outside established parameters. Discussed with the patient the importance of checking her BP and what can happen to her body with elevated BP Reviewed scheduled/upcoming provider appointments including: - pain assessed and managed- reduction of dietary sodium encouraged medication adherence assessment completed-patient states that she forgets to take her medications as prescribed, discussed options for with her to help her remember support and encouragement provided 11/18/20:  II talked to Mrs. Mignogna, and she is doing well. She has checked her blood sugars and blood pressure, not every day but sporadically. Blood sugar range from 175-267. Blood pressures range from 106/75 p 107 to155/89 p 93. She has noticed that there is a difference in each arm. Her pressures are lower in the left arm.  Her next appointment with Dr. Jeani Hawking is on 11/24/20, and Samson Frederic is on 12/03/20; she is excited because she will discuss obtaining a May. I encouraged her to continue to check her vitals and take her medications. she is noticing differences and becoming curious about how things affect her blood sugars and pressure. New contact information was given to her.     Patient Goals/[name] Care Activities:  Patient verbalizes understanding of plan [name]-administers medications as prescribed Calls pharmacy for  medication refills Call's provider office for new concerns or questions Check blood sugar at prescribed times Check blood sugar if I feel it is too high or too low Checks BP and records as discussed Follows a low sodium diet/DASH diet       [name] RN, BSN, [facility] Care Management Coordinator [place] Phone: [phone] I Fax: [phone]

## 2020-11-20 ENCOUNTER — Encounter: Payer: Self-pay | Admitting: Family Medicine

## 2020-11-20 DIAGNOSIS — M174 Other bilateral secondary osteoarthritis of knee: Secondary | ICD-10-CM

## 2020-11-21 MED ORDER — OXYCODONE HCL 5 MG PO TABS
ORAL_TABLET | ORAL | 0 refills | Status: DC
Start: 2020-11-21 — End: 2021-01-30

## 2020-11-21 NOTE — Progress Notes (Signed)
SUBJECTIVE:   CHIEF COMPLAINT / HPI:   HLD - atorvastatin 40 mg daily  - last labs 04/09/20: LDL 111, trig 130  Pain follow up - no change in pain level - previous PCP visit 10/17/20, new regimen includes:  increase pregabalin 50 mg to daily  use voltaren 50 mg tablet daily PRN as first line PRN use oxy IR 5 mg PRN as second line PRN - follow up one month - uses voltaren tablet about once every 3 days, notices some improvement, on the days she takes it it makes a big difference  - does not enable her to do more, but makes it less painful to do the things she already is doing  - is also alternating tylenol and ibuprofen every 6 hours every day, not really touching the pain - has not been able to pick up the oxycodone IR yet because insurance did not approve the capsule, had to re send in tablet instead - knees and shoulder felt better on voltaren PO tablet - has previously been on meloxicam without relief - not sure if lyrica is making her sleepy  Health Maintenance Mammogram scheduled for 11/17 No one from GI office has called for colonoscopy appointment Dr. Karleen Hampshire at [facility] is ophtho and does routine eye exams   PERTINENT  PMH / PSH:  Past Medical History:  Diagnosis Date   ALLERGIC RHINITIS    takes Zyrtec daily   Anxiety    Arthritis    Asthma    Albuterol prn;Symbicort daily and Singulair daily   Bronchitis    hx of   CAP (community acquired pneumonia) 02/20/2015   Carpal tunnel syndrome    right   Chronic back pain    Constipation    Miralax prn   Depression    takes Cymbalta daily   Diabetes (HCC)    type 2    Dizziness    pt states she gets off balance occasionally   Eczema    Gallstones 2010   GERD (gastroesophageal reflux disease)    takes Omeprazole daily   Headache(784.0)    couple of times a week   Hemorrhoids    Hypertension    takes Prinizide daily and Clonidine on Mondays   Hypoventilation associated with obesity syndrome (HCC)     Insomnia    Irritable bladder    Joint pain    Joint swelling    Lung disease    scleraderma   Morbid (severe) obesity due to excess calories (HCC) 04/10/2007   Restrictive changes on pfts 11/30/2007     Myasthenia gravis 1994   Positive Acetylcholine receptor Ab and single fiber EMG Dr. Clarisa [facility]- Dr. 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 Coralyn Helling- 10/30/2007 ANA positive, ANA titer  negative, RF less than 20   Rheumatoid arthritis(714.0)    Sarcoidosis 12/23/2010   Derm  dx NCG  12/03/10 (path report in EPIC)    Overview:  Overview:  Diagnosed on skin biopsy November 2012  Last Assessment & Plan:  Labs are unremarkable. CXR improved/stable. PFT wnl. I am happy patient had these tests and we have a baseline for her. At this time, I do not see an indication for starting chronic steroids daily. Patient agrees. Her skin lesions are bothering her the most and syst   Shortness of breath    can be sitting as well as exertion   Skin irritation    skin itchy   Trigger finger    Urinary frequency    takes Ditropan daily   Urinary urgency    Viral meningitis    history of viral meningitis      OBJECTIVE:   BP (!) 130/36 Comment: provider informed  Pulse (!) 114 Comment: provider informed  Ht 5\' 3"  (1.6 m)   Wt (!) 356 lb (161.5 kg)   SpO2 99%   BMI 63.06 kg/m    PHQ-9:  Depression screen [facility] 2/9 11/24/2020 10/17/2020 05/05/2020  Decreased Interest 3 3 3   Down, Depressed, Hopeless 1 2 1   PHQ - 2 Score 4 5 4   Altered sleeping 3 3  3   Tired, decreased energy 3 3 3   Change in appetite 3 3 3   Feeling bad or failure about yourself  1 3 1   Trouble concentrating 2 2 3   Moving slowly or fidgety/restless 2 2 2   Suicidal thoughts 0 0 0  PHQ-9 Score 18 21 19   Difficult doing work/chores - Very difficult Very difficult  Some recent data might be hidden     GAD-7:  GAD 7 : Generalized Anxiety Score 08/08/2017  Nervous, Anxious, on Edge 3  Control/stop worrying 2  Worry too much - different things 3  Trouble relaxing 3  Restless 2  Easily annoyed or irritable 3  Afraid - awful might happen 2  Total GAD 7 Score 18     Physical Exam General: Awake, alert, oriented Cardiovascular: Regular rate and rhythm, S1 and S2 present, no murmurs auscultated Respiratory: Lung fields clear to auscultation bilaterally  ASSESSMENT/PLAN:   Hyperlipidemia (primary prevention, LDL goal <100) Will obtain direct LDL to assess medication response.   Encounter for chronic pain management Stable. Pain uncontrolled on current regimen. Had not received oxy IR yet due to insurance coverage issues. DC concurrent ibuprofen use with voltaren PO tabs. Use voltaren PO sparingly.  - increase pregabalin 75 mg to daily  - use voltaren 50 mg tablet daily PRN as first line PRN (very sparingly) - use oxy IR 5 mg PRN as second line PRN  Healthcare maintenance Mammogram scheduled. Has her favorite ophtho to do eye exams (Dr. at [facility]), next due in January. GI has not contacted her re colonoscopy, will provide office number.      , MD [facility]

## 2020-11-23 ENCOUNTER — Other Ambulatory Visit: Payer: Self-pay | Admitting: Family Medicine

## 2020-11-23 DIAGNOSIS — G8929 Other chronic pain: Secondary | ICD-10-CM

## 2020-11-24 ENCOUNTER — Other Ambulatory Visit: Payer: Self-pay

## 2020-11-24 ENCOUNTER — Encounter: Payer: Self-pay | Admitting: Family Medicine

## 2020-11-24 ENCOUNTER — Ambulatory Visit (INDEPENDENT_AMBULATORY_CARE_PROVIDER_SITE_OTHER): Payer: Medicaid Other | Admitting: Family Medicine

## 2020-11-24 ENCOUNTER — Ambulatory Visit (INDEPENDENT_AMBULATORY_CARE_PROVIDER_SITE_OTHER): Payer: Medicaid Other

## 2020-11-24 VITALS — BP 130/36 | HR 114 | Ht 63.0 in | Wt 356.0 lb

## 2020-11-24 DIAGNOSIS — I10 Essential (primary) hypertension: Secondary | ICD-10-CM

## 2020-11-24 DIAGNOSIS — Z0000 Encounter for general adult medical examination without abnormal findings: Secondary | ICD-10-CM

## 2020-11-24 DIAGNOSIS — Z23 Encounter for immunization: Secondary | ICD-10-CM | POA: Diagnosis not present

## 2020-11-24 DIAGNOSIS — E0810 Diabetes mellitus due to underlying condition with ketoacidosis without coma: Secondary | ICD-10-CM | POA: Diagnosis not present

## 2020-11-24 DIAGNOSIS — E785 Hyperlipidemia, unspecified: Secondary | ICD-10-CM

## 2020-11-24 DIAGNOSIS — E1169 Type 2 diabetes mellitus with other specified complication: Secondary | ICD-10-CM

## 2020-11-24 DIAGNOSIS — G8929 Other chronic pain: Secondary | ICD-10-CM | POA: Diagnosis not present

## 2020-11-24 MED ORDER — ACCU-CHEK AVIVA PLUS VI STRP: 1.0000 | ORAL_STRIP | Freq: Three times a day (TID) | 3 refills | Status: DC

## 2020-11-24 MED ORDER — PREGABALIN 75 MG PO CAPS
75.0000 mg | ORAL_CAPSULE | Freq: Two times a day (BID) | ORAL | 2 refills | Status: DC
  Filled 2021-03-11: qty 60, 30d supply, fill #0
  Filled 2021-06-07: qty 60, 30d supply, fill #1
  Filled 2021-07-26: qty 60, 30d supply, fill #2

## 2020-11-24 NOTE — Patient Instructions (Addendum)
It was wonderful to see you today. Thank you for allowing me to be a part of your care. Below is a short summary of what we discussed at your visit today:  Pain regimen  Start using the oxycodone IR as needed once you get it from your pharmacy  You may use the voltaren tablet once per day as needed, but try to use sparingly.   Increase your Lyrica to 75 mg twice daily every day.   Cholesterol We will check your cholesterol today to see if the new dose of your atorvastatin is working well for you.   Blood pressure Please call the front office at [phone] and make an ambulatory blood pressure appointment with Dr. Raymondo Band.     Please bring all of your medications to every appointment!  If you have any questions or concerns, please do not hesitate to contact us via phone or MyChart message.   Fayette Pho, MD

## 2020-11-25 ENCOUNTER — Encounter: Payer: Self-pay | Admitting: Family Medicine

## 2020-11-25 DIAGNOSIS — E0810 Diabetes mellitus due to underlying condition with ketoacidosis without coma: Secondary | ICD-10-CM

## 2020-11-25 MED ORDER — ACCU-CHEK FASTCLIX LANCETS MISC
8 refills | Status: DC
  Filled 2021-07-26: qty 102, 25d supply, fill #0

## 2020-11-25 MED ORDER — ACCU-CHEK GUIDE W/DEVICE KIT: PACK | 0 refills | Status: AC

## 2020-11-25 MED ORDER — ACCU-CHEK GUIDE VI STRP: ORAL_STRIP | 12 refills | Status: DC

## 2020-11-26 LAB — LDL CHOLESTEROL, DIRECT: LDL Direct: 78 mg/dL (ref 0–99)

## 2020-11-28 NOTE — Assessment & Plan Note (Signed)
Stable. Pain uncontrolled on current regimen. Had not received oxy IR yet due to insurance coverage issues. DC concurrent ibuprofen use with voltaren PO tabs. Use voltaren PO sparingly.  - increase pregabalin 75 mg to daily  - use voltaren 50 mg tablet daily PRN as first line PRN (very sparingly) - use oxy IR 5 mg PRN as second line PRN

## 2020-11-28 NOTE — Assessment & Plan Note (Signed)
Will obtain direct LDL to assess medication response.

## 2020-11-28 NOTE — Assessment & Plan Note (Signed)
Mammogram scheduled. Has her favorite ophtho to do eye exams (Dr. Karleen Hampshire at [facility]), next due in January. GI has not contacted her re colonoscopy, will provide office number.

## 2020-12-03 ENCOUNTER — Ambulatory Visit (INDEPENDENT_AMBULATORY_CARE_PROVIDER_SITE_OTHER): Payer: Medicaid Other | Admitting: Pharmacist

## 2020-12-03 ENCOUNTER — Other Ambulatory Visit: Payer: Self-pay

## 2020-12-03 ENCOUNTER — Other Ambulatory Visit (HOSPITAL_COMMUNITY): Payer: Self-pay

## 2020-12-03 DIAGNOSIS — E1165 Type 2 diabetes mellitus with hyperglycemia: Secondary | ICD-10-CM

## 2020-12-03 DIAGNOSIS — Z794 Long term (current) use of insulin: Secondary | ICD-10-CM

## 2020-12-03 MED ORDER — DEXCOM G6 SENSOR MISC
2 refills | Status: DC
  Filled 2020-12-03: qty 3, fill #0
  Filled 2020-12-05: qty 3, 30d supply, fill #0
  Filled 2021-04-08: qty 3, 30d supply, fill #1
  Filled 2021-05-13: qty 3, 30d supply, fill #2

## 2020-12-03 MED ORDER — MOUNJARO 10 MG/0.5ML ~~LOC~~ SOAJ
10.0000 mg | SUBCUTANEOUS | 0 refills | Status: DC
  Filled 2020-12-03 – 2020-12-05 (×2): qty 2, 28d supply, fill #0

## 2020-12-03 MED ORDER — DEXCOM G6 TRANSMITTER MISC
0 refills | Status: DC
  Filled 2020-12-03: qty 1, fill #0
  Filled 2020-12-05: qty 1, 90d supply, fill #0

## 2020-12-03 MED ORDER — EMPAGLIFLOZIN 10 MG PO TABS
10.0000 mg | ORAL_TABLET | Freq: Every day | ORAL | 3 refills | Status: DC
  Filled 2020-12-03 – 2020-12-05 (×2): qty 90, 90d supply, fill #0

## 2020-12-03 NOTE — Patient Instructions (Addendum)
[name] it was a pleasure seeing you today.   Please do the following:  Start Jardiance 10mg  as directed today during your appointment. If you have any questions or if you believe something has occurred because of this change, call me or your doctor to let one of know. Continue checking blood sugars at home. It's really important that you record these and bring these in to your next doctor's appointment.  Continue making the lifestyle changes we've discussed together during our visit. Diet and exercise play a significant role in improving your blood sugars.  Please call us Outpatient pharmacy [phone] to set up delivery of medications Follow-up with me in three weeks.    Hypoglycemia or low blood sugar:   Low blood sugar can happen quickly and may become an emergency if not treated right away.   While this shouldn't happen often, it can be brought upon if you skip a meal or do not eat enough. Also, if your insulin or other diabetes medications are dosed too high, this can cause your blood sugar to go to low.   Warning signs of low blood sugar include: Feeling shaky or dizzy Feeling weak or tired  Excessive hunger Feeling anxious or upset  Sweating even when you aren't exercising  What to do if I experience low blood sugar? Follow the Rule of 15 Check your blood sugar with your meter. If lower than 70, proceed to step 2.  Treat with 15 grams of fast acting carbs which is found in 3-4 glucose tablets. If none are available you can try hard candy, 1 tablespoon of sugar or honey,4 ounces of fruit juice, or 6 ounces of REGULAR soda.  Re-check your sugar in 15 minutes. If it is still below 70, do what you did in step 2 again. If your blood sugar has come back up, go ahead and eat a snack or small meal made up of complex carbs (ex. Whole grains) and protein at this time to avoid recurrence of low blood sugar.

## 2020-12-03 NOTE — Progress Notes (Addendum)
Subjective:    Patient ID: [name], female    DOB: 01/28/1965, 55 y.o.   MRN: [id number]  HPI Patient is a 55 y.o. female who presents for diabetes management. She is in good spirits and presents with assistance. Patient was referred and last seen by Primary Care Provider on 11/24/20.  Patient reports diabetes was diagnosed around 4-5 years ago.   Insurance coverage/medication affordability: Medicaid  Current diabetes medications include: Toujeo 300 unit/mL 120 units once daily, Mounjaro 5mg  once weekly on Sundays Current blood pressure medications include: Amlodipine 5mg , HCTZ 25mg , irbesartan 150mg  Current cholesterol medications include: atorvastatin 40mg  Patient states that She is taking her medications as prescribed. Patient denies adherence with medications. Patient states that She misses her medications 2-3 times per month, on average.  Do you feel that your medications are working for you?  "I am not sure"  Have you been experiencing any side effects to the medications prescribed? no  Do you have any problems obtaining medications due to transportation or finances?  no     Patient denies hypoglycemic events. Patient denies polyuria (increased urination).  Patient denies polyphagia (increased appetite).  Patient denies polydipsia (increased thirst).  Patient reports neuropathy (nerve pain). Patient reports visual changes. Patient denies self foot exams.   Home fasting blood sugars: 181   Objective:   Labs:   Physical Exam Constitutional:      Appearance: She is obese.  Neurological:     Mental Status: She is alert and oriented to person, place, and time.    Review of Systems  Gastrointestinal:  Negative for nausea and vomiting.   Lab Results  Component Value Date   HGBA1C 10.3 (A) 10/17/2020   HGBA1C 9.8 (A) 04/09/2020   HGBA1C 9.0 (H) 11/16/2019    Lipid Panel     Component Value Date/Time   CHOL 168 04/09/2020 1725   TRIG 130 04/09/2020 1725    HDL 33 (L) 04/09/2020 1725   CHOLHDL 5.1 (H) 04/09/2020 1725   CHOLHDL 4.5 11/27/2015 1408   VLDL 27 11/27/2015 1408   LDLCALC 111 (H) 04/09/2020 1725   LDLDIRECT 78 11/24/2020 1702    Clinical Atherosclerotic Cardiovascular Disease (ASCVD): No  The 10-year ASCVD risk score (Arnett DK, et al., 2019) is: 16.1%   Values used to calculate the score:     Age: 44 years     Sex: Female     Is Non-Hispanic African American: Yes     Diabetic: Yes     Tobacco smoker: No     Systolic Blood Pressure: 130 mmHg     Is BP treated: Yes     HDL Cholesterol: 33 mg/dL     Total Cholesterol: 168 mg/dL   Assessment/Plan:   13/02/2015 is not controlled despite GLP1 and insulin therapy. Working towards obtaining Mounjaro through 04/11/2020 but provided additional sample in the meantime. Patient able to increase dose but will hold off for the time being due to being limited by sample medication. If unable to obtain will attempt to switch patient to Ozempic. Will also initiate Jardiance 10mg  as patient would benefit from additional blood glucose lowering other than insulin therapy. Patient on significant dose of basal insulin. Patient educated on purpose, proper use and potential adverse effects of Jardiance. Instructed patient to continue to check blood pressure at home as Jardiance may have some antihypertensive effects and HCTZ dose may need to be adjusted as needed. Patient expressed interest in CGM. Will attempt to obtain. Following instruction  patient verbalized understanding of treatment plan.    Continued basal insulin Toujeo 300 unit/mL 120 units once daily Provided sample of Mounjaro 2.5mg  inject 2 pens once weekly to make 5mg  dose Started Jardiance 10mg  once daily Sent in prescription for Dexcom CGM Extensively discussed pathophysiology of diabetes, dietary effects on blood sugar control, and recommended lifestyle interventions Counseled on s/sx of and management of hypoglycemia Next A1C  anticipated December 2022.   Follow-up appointment three weeks to review sugar readings. Written patient instructions provided.  This appointment required 30 minutes of direct patient care.  Thank you for involving pharmacy to assist in providing this patient's care.  Medication Samples have been provided to the patient.  Drug name:       Strength: 2.5mg         Qty: 1 box  LOT: January 2023 D  Exp.Date: 06/05/22  [id number] 5:01 PM 12/03/2020

## 2020-12-03 NOTE — Assessment & Plan Note (Signed)
T2DM is not controlled despite GLP1 and insulin therapy. Working towards obtaining Mounjaro through [employer] but provided additional sample in the meantime. Patient able to increase dose but will hold off for the time being due to being limited by sample medication. If unable to obtain will attempt to switch patient to Ozempic. Will also initiate Jardiance 10mg  as patient would benefit from additional blood glucose lowering other than insulin therapy. Patient on significant dose of basal insulin. Patient educated on purpose, proper use and potential adverse effects of Jardiance. Instructed patient to continue to check blood pressure at home as Jardiance may have some antihypertensive effects and HCTZ dose may need to be adjusted as needed.  Following instruction patient verbalized understanding of treatment plan.    1. Continued basal insulin Toujeo 300 unit/mL 120 units once daily 2. Provided sample of Mounjaro 2.5mg  inject 2 pens once weekly to make 5mg  dose 3. Started Jardiance 10mg  once daily 4. Extensively discussed pathophysiology of diabetes, dietary effects on blood sugar control, and recommended lifestyle interventions 5. Counseled on s/sx of and management of hypoglycemia 6. Next A1C anticipated December 2022.   Follow-up appointment three weeks to review sugar readings.

## 2020-12-05 ENCOUNTER — Other Ambulatory Visit (HOSPITAL_COMMUNITY): Payer: Self-pay

## 2020-12-05 MED ORDER — ACCU-CHEK GUIDE VI STRP
ORAL_STRIP | 11 refills | Status: DC
  Filled 2021-07-26: qty 100, 25d supply, fill #0
  Filled 2021-09-22: qty 100, 25d supply, fill #1

## 2020-12-05 MED ORDER — ACCU-CHEK GUIDE VI STRP
ORAL_STRIP | 6 refills | Status: DC
  Filled 2021-07-26: qty 100, 25d supply, fill #0
  Filled 2021-08-19: qty 100, 33d supply, fill #0

## 2020-12-05 MED ORDER — TRULICITY 1.5 MG/0.5ML ~~LOC~~ SOAJ: SUBCUTANEOUS | 2 refills | Status: DC

## 2020-12-05 MED ORDER — FLUTICASONE PROPIONATE 50 MCG/ACT NA SUSP: NASAL | 3 refills | Status: DC

## 2020-12-05 MED ORDER — ATORVASTATIN CALCIUM 20 MG PO TABS: 20.0000 mg | ORAL_TABLET | Freq: Every day | ORAL | 1 refills | Status: DC

## 2020-12-06 ENCOUNTER — Other Ambulatory Visit (HOSPITAL_COMMUNITY): Payer: Self-pay

## 2020-12-09 ENCOUNTER — Other Ambulatory Visit (HOSPITAL_COMMUNITY): Payer: Self-pay

## 2020-12-09 ENCOUNTER — Telehealth: Payer: Self-pay

## 2020-12-09 DIAGNOSIS — E785 Hyperlipidemia, unspecified: Secondary | ICD-10-CM

## 2020-12-09 DIAGNOSIS — E1169 Type 2 diabetes mellitus with other specified complication: Secondary | ICD-10-CM

## 2020-12-09 NOTE — Telephone Encounter (Signed)
Prior approval for Jardiance completed via [place] [employer].  Med approved for 12/09/20 - 06/07/21  Prior approval # [id number].  [facility] pharmacy informed.  Veronda Prude, RN

## 2020-12-09 NOTE — Telephone Encounter (Signed)
Completed PA info in [employer] for Jardiance 10 mg.  Status pending.  Will recheck status in 24 hours. Veronda Prude, RN

## 2020-12-09 NOTE — Telephone Encounter (Signed)
Completed PA info in [place] [employer] for Mounjaro 10 mg.  Status pending.  Will recheck status in 24 hours. Veronda Prude, RN

## 2020-12-09 NOTE — Telephone Encounter (Signed)
PA for [facility] denied. Please advise how patient should proceed.   Veronda Prude, RN

## 2020-12-10 ENCOUNTER — Other Ambulatory Visit (HOSPITAL_COMMUNITY): Payer: Self-pay

## 2020-12-11 ENCOUNTER — Ambulatory Visit
Admission: RE | Admit: 2020-12-11 | Discharge: 2020-12-11 | Disposition: A | Discharge status: Home / Self Care | Payer: Medicaid Other | Source: Ambulatory Visit | Attending: Family Medicine | Admitting: Family Medicine

## 2020-12-11 ENCOUNTER — Other Ambulatory Visit (HOSPITAL_COMMUNITY): Payer: Self-pay

## 2020-12-11 DIAGNOSIS — Z1231 Encounter for screening mammogram for malignant neoplasm of breast: Secondary | ICD-10-CM

## 2020-12-11 MED ORDER — TOUJEO MAX SOLOSTAR 300 UNIT/ML ~~LOC~~ SOPN
60.0000 [IU] | PEN_INJECTOR | Freq: Two times a day (BID) | SUBCUTANEOUS | 5 refills | Status: DC
  Filled 2020-12-11: qty 6, 15d supply, fill #0

## 2020-12-11 MED ORDER — OZEMPIC (1 MG/DOSE) 4 MG/3ML ~~LOC~~ SOPN
1.0000 mg | PEN_INJECTOR | SUBCUTANEOUS | 0 refills | Status: DC
Start: 2020-12-11 — End: 2020-12-29
  Filled 2020-12-11: qty 3, 28d supply, fill #0

## 2020-12-11 NOTE — Telephone Encounter (Signed)
Switched Mounjaro 10mg  to Ozempic 1mg  dose weekly. Will work towards titrating up Ozempic to 2mg  and apply for [facility] again in future if applicable.   Also split up patient's Toujeo dose from 120 units daily to 60 units BID.

## 2020-12-12 ENCOUNTER — Other Ambulatory Visit (HOSPITAL_COMMUNITY): Payer: Self-pay

## 2020-12-15 ENCOUNTER — Other Ambulatory Visit (HOSPITAL_COMMUNITY): Payer: Self-pay

## 2020-12-19 ENCOUNTER — Other Ambulatory Visit (HOSPITAL_COMMUNITY): Payer: Self-pay

## 2020-12-19 ENCOUNTER — Telehealth: Payer: Self-pay | Admitting: Family Medicine

## 2020-12-19 DIAGNOSIS — E1169 Type 2 diabetes mellitus with other specified complication: Secondary | ICD-10-CM

## 2020-12-19 MED ORDER — TOUJEO MAX SOLOSTAR 300 UNIT/ML ~~LOC~~ SOPN
60.0000 [IU] | PEN_INJECTOR | Freq: Two times a day (BID) | SUBCUTANEOUS | 5 refills | Status: DC
Start: 2020-12-19 — End: 2020-12-22

## 2020-12-19 NOTE — Telephone Encounter (Signed)
**  After Hours/ Emergency Line Call**  Received a call to report that [name] is needing a medication refill. She is a diabetic and has been out of her insulin since Wednesday. She had enough for a partial dose on Wednesday but none on Thursday and knew that the clinic was closed, but she didn't realize they were closed today as well. She tried to get it filled at the [facility] but they said they have a pending Prior Auth. She is not having any symptoms that she is worried about at the moment. Red flags discussed.  Will forward to PCP.  Evelena Leyden, DO PGY-2, [facility] 12/19/2020 2:28 PM

## 2020-12-22 ENCOUNTER — Other Ambulatory Visit (HOSPITAL_COMMUNITY): Payer: Self-pay

## 2020-12-22 ENCOUNTER — Telehealth (INDEPENDENT_AMBULATORY_CARE_PROVIDER_SITE_OTHER): Payer: Medicaid Other | Admitting: Pharmacist

## 2020-12-22 DIAGNOSIS — E1165 Type 2 diabetes mellitus with hyperglycemia: Secondary | ICD-10-CM

## 2020-12-22 DIAGNOSIS — Z794 Long term (current) use of insulin: Secondary | ICD-10-CM

## 2020-12-22 MED ORDER — LANTUS SOLOSTAR 100 UNIT/ML ~~LOC~~ SOPN
20.0000 [IU] | PEN_INJECTOR | Freq: Every day | SUBCUTANEOUS | 1 refills | Status: DC
  Filled 2020-12-22: qty 15, 70d supply, fill #0

## 2020-12-22 NOTE — Assessment & Plan Note (Signed)
T2DM is not controlled likely due to patient running out of medications and unable to currently access medications due to prior authorization requests. Due to blood glucose readings not being overly elevated despite lack of insulin and patient being out for several days will re-initiate Lantus at 20 units daily with instructions to titrate by 1 unit daily if fasting blood glucose >150 until follow-up in one week. Following instruction patient verbalized understanding of treatment plan.    1. Started Lantus 20 units once daily and titrate up by 1 unit daily if fasting blood glucose >150 2. Continued Jardiance 10mg  once daily 3. Sent in prescription for Dexcom CGM 4. Extensively discussed pathophysiology of diabetes, dietary effects on blood sugar control, and recommended lifestyle interventions 5. Counseled on s/sx of and management of hypoglycemia 6. Next A1C anticipated December 2022.   Follow-up appointment one week to review sugar readings.

## 2020-12-22 NOTE — Progress Notes (Signed)
Subjective:    Patient ID: [name], female    DOB: March 22, 1965, 55 y.o.   MRN: [id number]  HPI Patient is a 55 y.o. female who presents for diabetes management. She is in good spirits. Patient was referred and last seen by Primary Care Provider on 11/24/20. Last seen in pharmacy clinic on 12/03/20.  Patient unable to connect via video visit therefore appointment was switched to telephone.  Patient reports diabetes was diagnosed around 4-5 years ago.   Insurance coverage/medication affordability: Medicaid  Current diabetes medications include: Jardiance 10mg  once daily Patient reports being out of her insulin (Toujeo) since Wednesday due to the prescription needing a prior authorization. Ozempic is also pending a prior authorization Current blood pressure medications include: Amlodipine 5mg , HCTZ 25mg , irbesartan 150mg  Current cholesterol medications include: atorvastatin 40mg  Patient states that She is taking her medications as prescribed. Patient denies adherence with medications. Patient states that She misses her medications 2-3 times per month, on average.  Do you feel that your medications are working for you?  "I am not sure"  Have you been experiencing any side effects to the medications prescribed? no  Do you have any problems obtaining medications due to transportation or finances?  no     Patient denies hypoglycemic events. Patient denies polyuria (increased urination).  Patient denies polyphagia (increased appetite).  Patient denies polydipsia (increased thirst).  Patient reports neuropathy (nerve pain). Patient reports visual changes. Patient denies self foot exams.   Home fasting blood sugars: Patient reports she has not checked her blood glucose in several days due to being out of her insulin but last checked on 11/25 and it was 110 in the AM and then checked while on the phone and it was 215 after drinking sweet tea earlier  Objective:   Labs:   Physical  Exam Constitutional:      Appearance: She is obese.  Neurological:     Mental Status: She is alert and oriented to person, place, and time.    Review of Systems  Gastrointestinal:  Negative for nausea and vomiting.   Lab Results  Component Value Date   HGBA1C 10.3 (A) 10/17/2020   HGBA1C 9.8 (A) 04/09/2020   HGBA1C 9.0 (H) 11/16/2019    Lipid Panel     Component Value Date/Time   CHOL 168 04/09/2020 1725   TRIG 130 04/09/2020 1725   HDL 33 (L) 04/09/2020 1725   CHOLHDL 5.1 (H) 04/09/2020 1725   CHOLHDL 4.5 11/27/2015 1408   VLDL 27 11/27/2015 1408   LDLCALC 111 (H) 04/09/2020 1725   LDLDIRECT 78 11/24/2020 1702    Clinical Atherosclerotic Cardiovascular Disease (ASCVD): No  The 10-year ASCVD risk score (Arnett DK, et al., 2019) is: 16.1%   Values used to calculate the score:     Age: 75 years     Sex: Female     Is Non-Hispanic African American: Yes     Diabetic: Yes     Tobacco smoker: No     Systolic Blood Pressure: 130 mmHg     Is BP treated: Yes     HDL Cholesterol: 33 mg/dL     Total Cholesterol: 168 mg/dL   Assessment/Plan:   13/02/2015 is not controlled likely due to patient running out of medications and unable to currently access medications due to prior authorization requests. Due to blood glucose readings not being overly elevated despite lack of insulin and patient being out for several days will re-initiate Lantus at 20 units daily with  instructions to titrate by 1 unit daily if fasting blood glucose >150 until follow-up in one week. Following instruction patient verbalized understanding of treatment plan.    Started Lantus 20 units once daily and titrate up by 1 unit daily if fasting blood glucose >150 Continued Jardiance 10mg  once daily Sent in prescription for Dexcom CGM Extensively discussed pathophysiology of diabetes, dietary effects on blood sugar control, and recommended lifestyle interventions Counseled on s/sx of and management of hypoglycemia Next  A1C anticipated December 2022.   Follow-up appointment one week to review sugar readings. Written patient instructions provided.  This appointment required 20 minutes of non-face-to-face patient care.  Thank you for involving pharmacy to assist in providing this patient's care.

## 2020-12-22 NOTE — Telephone Encounter (Signed)
Patient called and states that her insulin is needing a PA before they will fill it.  She is asking if she can get the old Lantus script called in until this can be done.  She would like for it to be sent to [facility]. She also has an appt this afternoon with Dr. Nicholaus Bloom but would prefer it be changed to a virtual.  Will forward message to both PCP and Dr. Nicholaus Bloom.  Kert Shackett,CMA

## 2020-12-23 ENCOUNTER — Other Ambulatory Visit (HOSPITAL_COMMUNITY): Payer: Self-pay

## 2020-12-25 ENCOUNTER — Other Ambulatory Visit (HOSPITAL_COMMUNITY): Payer: Self-pay

## 2020-12-29 ENCOUNTER — Ambulatory Visit (INDEPENDENT_AMBULATORY_CARE_PROVIDER_SITE_OTHER): Payer: Medicaid Other | Admitting: Pharmacist

## 2020-12-29 ENCOUNTER — Other Ambulatory Visit (HOSPITAL_COMMUNITY): Payer: Self-pay

## 2020-12-29 ENCOUNTER — Other Ambulatory Visit: Payer: Self-pay

## 2020-12-29 DIAGNOSIS — Z794 Long term (current) use of insulin: Secondary | ICD-10-CM | POA: Diagnosis not present

## 2020-12-29 DIAGNOSIS — E1165 Type 2 diabetes mellitus with hyperglycemia: Secondary | ICD-10-CM

## 2020-12-29 MED ORDER — OZEMPIC (0.25 OR 0.5 MG/DOSE) 2 MG/1.5ML ~~LOC~~ SOPN
0.5000 mg | PEN_INJECTOR | SUBCUTANEOUS | 0 refills | Status: DC
  Filled 2020-12-29: qty 1.5, 28d supply, fill #0

## 2020-12-29 MED ORDER — INSULIN DEGLUDEC 100 UNIT/ML ~~LOC~~ SOPN
30.0000 [IU] | PEN_INJECTOR | Freq: Every day | SUBCUTANEOUS | 2 refills | Status: DC
Start: 2020-12-29 — End: 2020-12-31
  Filled 2020-12-29: qty 9, 30d supply, fill #0

## 2020-12-29 NOTE — Progress Notes (Signed)
Subjective:    Patient ID: [name], female    DOB: 06-25-1965, 55 y.o.   MRN: [id number]  HPI Patient is a 55 y.o. female who presents for diabetes management. She is in good spirits. Patient was referred and last seen by Primary Care Provider on 11/24/20. Last seen in pharmacy clinic on 12/12/20  Patient reports diabetes was diagnosed around 4-5 years ago.   Insurance coverage/medication affordability: [employer] Medicare/Medicaid  Current diabetes medications include: Jardiance 10mg  once daily, Lantus 25 units once daily  Current blood pressure medications include: Amlodipine 5mg , HCTZ 25mg , irbesartan 150mg  Current cholesterol medications include: atorvastatin 40mg  Patient states that She is taking her medications as prescribed. Patient denies adherence with medications. Patient states that She misses her medications 2-3 times per month, on average.  Do you feel that your medications are working for you?  "I am not sure"  Have you been experiencing any side effects to the medications prescribed? no  Do you have any problems obtaining medications due to transportation or finances?  no     Patient denies hypoglycemic events. Patient denies polyuria (increased urination).  Patient denies polyphagia (increased appetite).  Patient denies polydipsia (increased thirst).  Patient reports neuropathy (nerve pain). Patient reports visual changes. Patient denies self foot exams.   Objective:   Glucometer readings: Fasting: 121, 166, 196, 184 Post-prandial/Random: 156, 153  Labs:   Physical Exam Constitutional:      Appearance: She is obese.  Neurological:     Mental Status: She is alert and oriented to person, place, and time.    Review of Systems  Gastrointestinal:  Negative for nausea and vomiting.   Lab Results  Component Value Date   HGBA1C 10.3 (A) 10/17/2020   HGBA1C 9.8 (A) 04/09/2020   HGBA1C 9.0 (H) 11/16/2019   Vitals:   12/29/20 1540  Weight: (!) 345 lb  6.4 oz (156.7 kg)     Lipid Panel     Component Value Date/Time   CHOL 168 04/09/2020 1725   TRIG 130 04/09/2020 1725   HDL 33 (L) 04/09/2020 1725   CHOLHDL 5.1 (H) 04/09/2020 1725   CHOLHDL 4.5 11/27/2015 1408   VLDL 27 11/27/2015 1408   LDLCALC 111 (H) 04/09/2020 1725   LDLDIRECT 78 11/24/2020 1702    Clinical Atherosclerotic Cardiovascular Disease (ASCVD): No  The 10-year ASCVD risk score (Arnett DK, et al., 2019) is: 16.1%   Values used to calculate the score:     Age: 53 years     Sex: Female     Is Non-Hispanic African American: Yes     Diabetic: Yes     Tobacco smoker: No     Systolic Blood Pressure: 130 mmHg     Is BP treated: Yes     HDL Cholesterol: 33 mg/dL     Total Cholesterol: 168 mg/dL   Assessment/Plan:   13/02/2015 is not controlled likely due to patient running out of medications and unable to currently access medications due to prior authorization requests. Patient's insurance recently switched therefore will send in new prescriptions for medications. As patient is back on lower dose of insulin will try to switch patient to 04/11/2020 from Lantus. Can re-start Toujeo in the future if needed. Will increase dose from 25 units to 30 units once daily as patient still having slightly elevated blood glucose readings and still unable to obtain Ozempic. As patient has not been on GLP1 for about 4 weeks (previously on Trulicity), will back down Ozempic dose to  0.5mg  once weekly. At future visit can consider increasing dose of Jardiance to 25mg  once daily.  Following instruction patient verbalized understanding of treatment plan.    Increased dose of basal insulin to 30 units once daily and attempting to switch from lantus to Continued Jardiance 10mg  once daily Sent in new prescription for Ozempic 0.5mg  once weekly in place of 1mg  Sent in CGM request to DME supplier Extensively discussed pathophysiology of diabetes, dietary effects on blood sugar control, and  recommended lifestyle interventions Counseled on s/sx of and management of hypoglycemia Next A1C anticipated December 2022.   Follow-up appointment two weeks to review sugar readings. Written patient instructions provided.  This appointment required 30 minutes of non-face-to-face patient care.  Thank you for involving pharmacy to assist in providing this patient's care.

## 2020-12-29 NOTE — Assessment & Plan Note (Signed)
T2DM is not controlled likely due to patient running out of medications and unable to currently access medications due to prior authorization requests. Patient's insurance recently switched therefore will send in new prescriptions for medications. As patient is back on lower dose of insulin will try to switch patient to [place] from Lantus. Can re-start Toujeo in the future if needed. Will increase dose from 25 units to 30 units once daily as patient still having slightly elevated blood glucose readings and still unable to obtain Ozempic. As patient has not been on GLP1 for about 4 weeks (previously on Trulicity), will back down Ozempic dose to 0.5mg  once weekly. At future visit can consider increasing dose of Jardiance to 25mg  once daily.  Following instruction patient verbalized understanding of treatment plan.    1. Increased dose of basal insulin to 30 units once daily and attempting to switch from lantus to 2. Continued Jardiance 10mg  once daily 3. Sent in new prescription for Ozempic 0.5mg  once weekly in place of 1mg  4. Sent in CGM request to DME supplier 5. Extensively discussed pathophysiology of diabetes, dietary effects on blood sugar control, and recommended lifestyle interventions 6. Counseled on s/sx of and management of hypoglycemia 7. Next A1C anticipated December 2022.   Follow-up appointment two weeks to review sugar readings

## 2020-12-29 NOTE — Patient Instructions (Signed)
Ms. Debby it was a pleasure seeing you today.   Please do the following:  Increase your insulin to 30 units once daily as directed today during your appointment. If you have any questions or if you believe something has occurred because of this change, call me or your doctor to let one of [place] know.  I sent in a new prescription for Ozempic 0.5mg  once weekly Continue checking blood sugars at home. It's really important that you record these and bring these in to your next doctor's appointment.  Continue making the lifestyle changes we've discussed together during our visit. Diet and exercise play a significant role in improving your blood sugars.  Follow-up with me in two weeks.    Hypoglycemia or low blood sugar:   Low blood sugar can happen quickly and may become an emergency if not treated right away.   While this shouldn't happen often, it can be brought upon if you skip a meal or do not eat enough. Also, if your insulin or other diabetes medications are dosed too high, this can cause your blood sugar to go to low.   Warning signs of low blood sugar include: Feeling shaky or dizzy Feeling weak or tired  Excessive hunger Feeling anxious or upset  Sweating even when you aren't exercising  What to do if I experience low blood sugar? Follow the Rule of 15 Check your blood sugar with your meter. If lower than 70, proceed to step 2.  Treat with 15 grams of fast acting carbs which is found in 3-4 glucose tablets. If none are available you can try hard candy, 1 tablespoon of sugar or honey,4 ounces of fruit juice, or 6 ounces of REGULAR soda.  Re-check your sugar in 15 minutes. If it is still below 70, do what you did in step 2 again. If your blood sugar has come back up, go ahead and eat a snack or small meal made up of complex carbs (ex. Whole grains) and protein at this time to avoid recurrence of low blood sugar.

## 2020-12-31 ENCOUNTER — Other Ambulatory Visit (HOSPITAL_COMMUNITY): Payer: Self-pay

## 2020-12-31 ENCOUNTER — Telehealth: Payer: Self-pay

## 2020-12-31 MED ORDER — LANTUS SOLOSTAR 100 UNIT/ML ~~LOC~~ SOPN
30.0000 [IU] | PEN_INJECTOR | Freq: Every day | SUBCUTANEOUS | 1 refills | Status: DC
Start: 2020-12-31 — End: 2021-02-18
  Filled 2020-12-31: qty 15, 50d supply, fill #0
  Filled 2021-02-16: qty 15, 50d supply, fill #1

## 2020-12-31 NOTE — Telephone Encounter (Signed)
Completed PA info in [place] [employer] for Ozempic.  Status pending.  Will recheck status in 24 hours. Veronda Prude, RN

## 2020-12-31 NOTE — Telephone Encounter (Signed)
Received fax from pharmacy regarding [name] not being covered by insurance.   Please advise if the below alternatives are suitable.  -Levemir -Lantus  If the above alternatives are not appropriate, please include reasoning for non preferred drug.   Veronda Prude, RN

## 2021-01-01 ENCOUNTER — Other Ambulatory Visit (HOSPITAL_COMMUNITY): Payer: Self-pay

## 2021-01-02 ENCOUNTER — Other Ambulatory Visit (HOSPITAL_COMMUNITY): Payer: Self-pay

## 2021-01-02 NOTE — Telephone Encounter (Signed)
Approval received from 12/31/20-12/31/21.  Called pharmacy with approval.   Veronda Prude, RN

## 2021-01-05 ENCOUNTER — Other Ambulatory Visit (HOSPITAL_COMMUNITY): Payer: Self-pay

## 2021-01-05 ENCOUNTER — Telehealth: Payer: Self-pay

## 2021-01-05 NOTE — Telephone Encounter (Signed)
Prior approval for Dexcom sensors completed via [place] [employer].  Med approved for 01/05/2021 - 07/04/2021 Prior approval # [id number].  [facility] informed.  Veronda Prude, RN

## 2021-01-06 ENCOUNTER — Other Ambulatory Visit (HOSPITAL_COMMUNITY): Payer: Self-pay

## 2021-01-07 ENCOUNTER — Other Ambulatory Visit (HOSPITAL_COMMUNITY): Payer: Self-pay

## 2021-01-07 ENCOUNTER — Telehealth: Payer: Self-pay

## 2021-01-07 NOTE — Telephone Encounter (Signed)
Prior approval for [employer] completed via [employer].  Med approved for 01/06/2021 - 07/05/2021. Prior approval # [id number].  [facility] informed.  Veronda Prude, RN

## 2021-01-14 ENCOUNTER — Ambulatory Visit: Payer: Medicare Other | Admitting: Pharmacist

## 2021-01-30 ENCOUNTER — Other Ambulatory Visit: Payer: Self-pay | Admitting: Family Medicine

## 2021-01-30 DIAGNOSIS — M174 Other bilateral secondary osteoarthritis of knee: Secondary | ICD-10-CM

## 2021-02-02 ENCOUNTER — Other Ambulatory Visit (HOSPITAL_COMMUNITY): Payer: Self-pay

## 2021-02-02 MED ORDER — OXYCODONE HCL 5 MG PO TABS
ORAL_TABLET | ORAL | 0 refills | Status: DC
  Filled 2021-02-02: qty 30, 7d supply, fill #0

## 2021-02-04 ENCOUNTER — Ambulatory Visit (INDEPENDENT_AMBULATORY_CARE_PROVIDER_SITE_OTHER): Payer: Commercial Managed Care - HMO | Admitting: Pharmacist

## 2021-02-04 ENCOUNTER — Other Ambulatory Visit: Payer: Self-pay

## 2021-02-04 DIAGNOSIS — Z794 Long term (current) use of insulin: Secondary | ICD-10-CM

## 2021-02-04 DIAGNOSIS — E1165 Type 2 diabetes mellitus with hyperglycemia: Secondary | ICD-10-CM

## 2021-02-04 NOTE — Progress Notes (Signed)
Subjective:    Patient ID: [name], female    DOB: 1965-11-30, 56 y.o.   MRN: [id number]  HPI Patient is a 56 y.o. female who presents for diabetes management. She is in good spirits. Patient was referred and last seen by Primary Care Provider on 11/24/20. Last seen in pharmacy clinic on 12/29/21.  Patient reports diabetes was diagnosed around 4-5 years ago.   Insurance coverage/medication affordability: UHC Medicare/Medicaid  Current diabetes medications include: Jardiance 10mg  once daily, Lantus 60 units once daily Current blood pressure medications include: Amlodipine 5mg , HCTZ 25mg , irbesartan 150mg  Current cholesterol medications include: atorvastatin 40mg  Patient states that She is taking her medications as prescribed. Patient denies adherence with medications. Patient states that She misses her medications 2-3 times per month, on average.  Do you feel that your medications are working for you?  "I am not sure"  Have you been experiencing any side effects to the medications prescribed? no  Do you have any problems obtaining medications due to transportation or finances?  no     Patient denies hypoglycemic events. Patient reports polyuria (increased urination).  Patient denies polyphagia (increased appetite).  Patient denies polydipsia (increased thirst).  Patient denies neuropathy (nerve pain). Patient denies visual changes. Patient denies self foot exams.   Objective:   Glucometer readings: Fasting: 378, 191, 182, 241, 209, 229, 184, 136, 145, 170, 196, 166, 121, 153 Post-prandial/Random: 247, 230, 283, 267, 287, 311, 302, 125, 160, 244, 165, 101, 144, 184, 156  Labs:   Physical Exam Constitutional:      Appearance: She is obese.  Neurological:     Mental Status: She is alert and oriented to person, place, and time.    Review of Systems  Gastrointestinal:  Negative for nausea and vomiting.   Lab Results  Component Value Date   HGBA1C 10.3 (A)  10/17/2020   HGBA1C 9.8 (A) 04/09/2020   HGBA1C 9.0 (H) 11/16/2019   There were no vitals filed for this visit.    Lipid Panel     Component Value Date/Time   CHOL 168 04/09/2020 1725   TRIG 130 04/09/2020 1725   HDL 33 (L) 04/09/2020 1725   CHOLHDL 5.1 (H) 04/09/2020 1725   CHOLHDL 4.5 11/27/2015 1408   VLDL 27 11/27/2015 1408   LDLCALC 111 (H) 04/09/2020 1725   LDLDIRECT 78 11/24/2020 1702    Clinical Atherosclerotic Cardiovascular Disease (ASCVD): No  The 10-year ASCVD risk score (Arnett DK, et al., 2019) is: 16.1%   Values used to calculate the score:     Age: 4 years     Sex: Female     Is Non-Hispanic African American: Yes     Diabetic: Yes     Tobacco smoker: No     Systolic Blood Pressure: 130 mmHg     Is BP treated: Yes     HDL Cholesterol: 33 mg/dL     Total Cholesterol: 168 mg/dL   Assessment/Plan:   G9JM is not controlled likely due to patient running out of medications and unable to currently access medications due to prior authorization requests. All prior authorization requests have been approved therefore and made patient aware. Patient self-titrated insulin to 60 units without discussing with office, but as blood glucose readings continue to remain elevated will allow patient to continue 60 units. Patient planning to follow-up with pharmacy if she does not receive Ozempic. Will continue to hold jardiance for now until patient glucose improves slightly. Following instruction patient verbalized understanding of treatment  plan.    Continued dose of basal insulin Lantus 60 units once daily Start Ozempic 0.5mg  once weekly Hold Jardiance 10mg  once daily until glucose improves Sent in new prescription for Ozempic 0.5mg  once weekly Sent in CGM request to DME supplier for reader device Extensively discussed pathophysiology of diabetes, dietary effects on blood sugar control, and recommended lifestyle interventions Counseled on s/sx of and management of  hypoglycemia Patient is due for next A1C but requests to hold off until next appointment  Follow-up appointment two weeks to review sugar readings. Written patient instructions provided.  This appointment required 30 minutes of non-face-to-face patient care.  Thank you for involving pharmacy to assist in providing this patient's care.

## 2021-02-04 NOTE — Patient Instructions (Signed)
Ms. Ivy LynnCelina it was a pleasure seeing you today.   Please do the following:  Continue 60 units of Lantus as directed today during your appointment. If you have any questions or if you believe something has occurred because of this change, call me or your doctor to let one of us know.  Hold Jardiance 10mg  until blood sugars improve.  Start Ozempic 0.5mg  once weekly Continue checking blood sugars at home. It's really important that you record these and bring these in to your next doctor's appointment.  Continue making the lifestyle changes we've discussed together during our visit. Diet and exercise play a significant role in improving your blood sugars.  Follow-up with me in 2 wks.    Hypoglycemia or low blood sugar:   Low blood sugar can happen quickly and may become an emergency if not treated right away.   While this shouldn't happen often, it can be brought upon if you skip a meal or do not eat enough. Also, if your insulin or other diabetes medications are dosed too high, this can cause your blood sugar to go to low.   Warning signs of low blood sugar include: Feeling shaky or dizzy Feeling weak or tired  Excessive hunger Feeling anxious or upset  Sweating even when you aren't exercising  What to do if I experience low blood sugar? Follow the Rule of 15 Check your blood sugar with your meter. If lower than 70, proceed to step 2.  Treat with 15 grams of fast acting carbs which is found in 3-4 glucose tablets. If none are available you can try hard candy, 1 tablespoon of sugar or honey,4 ounces of fruit juice, or 6 ounces of REGULAR soda.  Re-check your sugar in 15 minutes. If it is still below 70, do what you did in step 2 again. If your blood sugar has come back up, go ahead and eat a snack or small meal made up of complex carbs (ex. Whole grains) and protein at this time to avoid recurrence of low blood sugar.

## 2021-02-05 ENCOUNTER — Other Ambulatory Visit (HOSPITAL_COMMUNITY): Payer: Self-pay

## 2021-02-16 ENCOUNTER — Other Ambulatory Visit (HOSPITAL_COMMUNITY): Payer: Self-pay

## 2021-02-16 ENCOUNTER — Other Ambulatory Visit: Payer: Self-pay | Admitting: Family Medicine

## 2021-02-16 MED ORDER — OZEMPIC (0.25 OR 0.5 MG/DOSE) 2 MG/1.5ML ~~LOC~~ SOPN
0.5000 mg | PEN_INJECTOR | SUBCUTANEOUS | 0 refills | Status: DC
  Filled 2021-02-16: qty 1.5, 28d supply, fill #0

## 2021-02-16 NOTE — Assessment & Plan Note (Signed)
T2DM is not controlled likely due to patient running out of medications and unable to currently access medications due to prior authorization requests. All prior authorization requests have been approved therefore and made patient aware. Patient self-titrated insulin to 60 units without discussing with office, but as blood glucose readings continue to remain elevated will allow patient to continue 60 units. Patient planning to follow-up with pharmacy if she does not receive Ozempic. Will continue to hold jardiance for now until patient glucose improves slightly. Following instruction patient verbalized understanding of treatment plan.    1. Continued dose of basal insulin Lantus 60 units once daily 2. Start Ozempic 0.5mg  once weekly 3. Hold Jardiance 10mg  once daily until glucose improves 4. Sent in new prescription for Ozempic 0.5mg  once weekly 5. Sent in CGM request to DME supplier for reader device 6. Extensively discussed pathophysiology of diabetes, dietary effects on blood sugar control, and recommended lifestyle interventions 7. Counseled on s/sx of and management of hypoglycemia 8. Patient is due for next A1C but requests to hold off until next appointment  Follow-up appointment two weeks to review sugar readings.

## 2021-02-18 ENCOUNTER — Other Ambulatory Visit (HOSPITAL_COMMUNITY): Payer: Self-pay

## 2021-02-18 ENCOUNTER — Telehealth (INDEPENDENT_AMBULATORY_CARE_PROVIDER_SITE_OTHER): Payer: Medicare Other | Admitting: Pharmacist

## 2021-02-18 DIAGNOSIS — E1165 Type 2 diabetes mellitus with hyperglycemia: Secondary | ICD-10-CM

## 2021-02-18 DIAGNOSIS — Z794 Long term (current) use of insulin: Secondary | ICD-10-CM

## 2021-02-18 MED ORDER — DEXCOM G6 RECEIVER DEVI
0 refills | Status: DC
  Filled 2021-02-18: qty 1, 90d supply, fill #0

## 2021-02-18 MED ORDER — LANTUS SOLOSTAR 100 UNIT/ML ~~LOC~~ SOPN
60.0000 [IU] | PEN_INJECTOR | Freq: Every day | SUBCUTANEOUS | 3 refills | Status: DC
  Filled 2021-02-18 – 2021-03-11 (×2): qty 15, 25d supply, fill #0

## 2021-02-18 NOTE — Assessment & Plan Note (Addendum)
T2DM is not controlled likely due to issues accessing medications and sub-optimal lifestyle.  Patient self-titrated insulin to 60 units without discussing with office, but as blood glucose readings continue to remain elevated will allow patient to continue 60 units. Patient planning to follow-up with pharmacy if she does not receive Ozempic. Will continue to hold jardiance until patient comes into clinic next week and can obtain updated BMet. Patient instructed to take first dose of Ozempic next week. Patient educated on purpose, proper use and potential adverse effects of Ozempic. Extensively discussed options for dietary changes with patient such as drinking gatorade zero in place of juice, reducing sweet team consumption, drinking water with sugar free flavor packets, and eating more vegetables. Following instruction patient verbalized understanding of treatment plan.   1. Continued dose of basal insulin Lantus 60 units once daily 2. Start Ozempic 0.5mg  once weekly 3. Hold Jardiance 10mg  once daily until next visit 4. Extensively discussed pathophysiology of diabetes, dietary effects on blood sugar control, and recommended lifestyle interventions 5. Counseled on s/sx of and management of hypoglycemia 6. Patient is due for next A1C and will obtain at next appointment

## 2021-02-18 NOTE — Progress Notes (Signed)
Subjective:    Patient ID: [name], female    DOB: 1965/03/19, 56 y.o.   MRN: [id number]  HPI Patient is a 56 y.o. female who presents for diabetes management. She is in good spirits. Patient was referred and last seen by Primary Care Provider on 11/24/20. Last seen in pharmacy clinic on 02/04/21.  I connected with  Savon A Cocking on 02/18/21 by a video enabled telemedicine application and verified that I am speaking with the correct person using two identifiers.   I discussed the limitations of evaluation and management by telemedicine. The patient expressed understanding and agreed to proceed. Patient located at home and provider located in office.   Patient reports she received Ozempic in the mail today but has not administered her first dose yet.  Patient reports diabetes was diagnosed around 4-5 years ago.   Insurance coverage/medication affordability: UHC Medicare/Medicaid  Current diabetes medications include: holding Jardiance  once daily, Lantus 60 units once daily Current blood pressure medications include: Amlodipine , HCTZ , irbesartan  Current cholesterol medications include: atorvastatin  Patient states that She is taking her medications as prescribed. Patient denies adherence with medications. Patient states that She misses her medications 2-3 times per month, on average.  Do you feel that your medications are working for you?  "I am not sure"  Have you been experiencing any side effects to the medications prescribed? no  Do you have any problems obtaining medications due to transportation or finances?  no      Patient reported dietary habits: Eats 1 big meal a day (dinner) and snacks throughout the day.  She reports she cannot cook so she eats "whatever I can get." Her snacks consist of fruit, cookies, or chips. She drinks around 40 oz of sweet tea/day.  Patient denies hypoglycemic events. Patient denies polyuria (increased urination).   Patient reports polyphagia (increased appetite).  Patient reports polydipsia (increased thirst).  Patient denies neuropathy (nerve pain). Patient denies visual changes. Patient denies self foot exams.   Patient reported glucose readings: 220's  Objective:   Labs:   Physical Exam Constitutional:      Appearance: She is obese.  Neurological:     Mental Status: She is alert and oriented to person, place, and time.    Review of Systems  Gastrointestinal:  Negative for nausea and vomiting.   Lab Results  Component Value Date   HGBA1C 10.3 (A) 10/17/2020   HGBA1C 9.8 (A) 04/09/2020   HGBA1C 9.0 (H) 11/16/2019   There were no vitals filed for this visit.    Lipid Panel     Component Value Date/Time   CHOL 168 04/09/2020 1725   TRIG 130 04/09/2020 1725   HDL 33 (L) 04/09/2020 1725   CHOLHDL 5.1 (H) 04/09/2020 1725   CHOLHDL 4.5 11/27/2015 1408   VLDL 27 11/27/2015 1408   LDLCALC 111 (H) 04/09/2020 1725   LDLDIRECT 78 11/24/2020 1702    Clinical Atherosclerotic Cardiovascular Disease (ASCVD): No  The 10-year ASCVD risk score (Arnett DK, et al., 2019) is: 16.1%   Values used to calculate the score:     Age: 52 years     Sex: Female     Is Non-Hispanic African American: Yes     Diabetic: Yes     Tobacco smoker: No     Systolic Blood Pressure: 130 mmHg     Is BP treated: Yes     HDL Cholesterol: 33 mg/dL     Total Cholesterol: 168 mg/dL  Assessment/Plan:   T2DM is not controlled likely due to issues accessing medications and sub-optimal lifestyle.  Patient self-titrated insulin to 60 units without discussing with office, but as blood glucose readings continue to remain elevated will allow patient to continue 60 units. Patient planning to follow-up with pharmacy if she does not receive Ozempic. Will continue to hold jardiance until patient comes into clinic next week and can obtain updated BMet. Patient instructed to take first dose of Ozempic next week. Patient  educated on purpose, proper use and potential adverse effects of Ozempic. Extensively discussed options for dietary changes with patient such as drinking gatorade zero in place of juice, reducing sweet team consumption, drinking water with sugar free flavor packets, and eating more vegetables. Following instruction patient verbalized understanding of treatment plan.   Continued dose of basal insulin Lantus 60 units once daily Start Ozempic 0.5mg  once weekly Hold Jardiance 10mg  once daily until next visit Extensively discussed pathophysiology of diabetes, dietary effects on blood sugar control, and recommended lifestyle interventions Counseled on s/sx of and management of hypoglycemia Patient is due for next A1C and will obtain at next appointment  Follow-up appointment one week to review sugar readings. Written patient instructions provided.  This appointment required 22 minutes of non-face-to-face patient care.  Thank you for involving pharmacy to assist in providing this patient's care.

## 2021-02-24 NOTE — Progress Notes (Signed)
SUBJECTIVE:   CHIEF COMPLAINT / HPI:   HLD - Currently on atorvastatin 40 mg - Last LDL September 2022 was 79 - This patient is a diabetic who needs primary prevention, thus LDL goal <70 - Currently tolerating atorvastatin well, no complaints   Lab Results  Component Value Date   CHOL 168 04/09/2020   HDL 33 (L) 04/09/2020   LDLCALC 111 (H) 04/09/2020   LDLDIRECT 78 11/24/2020   TRIG 130 04/09/2020   CHOLHDL 5.1 (H) 04/09/2020   Diabetic neuropathy - Several months of worsening irritability discomfort in BLE - Not limited to nighttime, happens throughout the day - Intermittent pain of feet, feel hot, pinprick sensation - Diabetes currently uncontrolled, working closely with Marzetta Merino to gain control -Is currently on Lyrica is a 5 mg  Lab Results  Component Value Date   HGBA1C 10.4 (A) 02/25/2021   HGBA1C 10.3 (A) 10/17/2020   HGBA1C 9.8 (A) 04/09/2020   Lab Results  Component Value Date   LDLCALC 111 (H) 04/09/2020   CREATININE 0.72 04/09/2020   Need for extended hours of home aide - Patient requests increase in hours for home aide - Reports that she has a home health service that comes twice a week but that she could use more help - Does live at home with her 2 sons, aged 60 and 42 - Cannot rely on her sons for help - Reports difficulty cooking and cleaning for herself - She reports that she starting to feel somewhat neglected  PERTINENT  PMH / PSH:  Patient Active Problem List   Diagnosis Date Noted   Diabetic neuropathy ([place]) 02/25/2021   Healthcare maintenance 10/21/2020   Hyperlipidemia (primary prevention, LDL goal <70) 09/04/2020   Wheelchair dependence 05/05/2020   Carpal tunnel syndrome of right wrist, RECURRENT 10/15/2019   Insomnia, idiopathic 09/30/2018   Grade I diastolic dysfunction [id number]   Encounter for chronic pain management 02/11/2014   Diabetes ([place]) 02/11/2014   Cystocele 09/14/2012   Chronic pain of both knees 05/28/2010    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   Allergic rhinitis 05/30/2008   OBESITY HYPOVENTILATION SYNDROME 12/27/2007   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   Obstructive sleep apnea 10/30/2007   BMI 60.0-69.9, adult ([place]) 04/10/2007   MYASTHENIA 03/07/2007   Depression, recurrent ([place]) 03/24/2006   Essential (primary) hypertension 03/24/2006   Gastro-esophageal reflux disease without esophagitis 03/24/2006     OBJECTIVE:   BP 133/61    Pulse (!) 112    Ht 5\' 3"  (1.6 m)    Wt (!) 347 lb 6.4 oz (157.6 kg)    SpO2 97%    BMI 61.54 kg/m    PHQ-9:  Depression screen [facility] 2/9 02/25/2021 11/24/2020 10/17/2020  Decreased Interest 2 3 3   Down, Depressed, Hopeless 2 1 2   PHQ - 2 Score 4 4 5   Altered sleeping 3 3 3   Tired, decreased energy 3 3 3   Change in appetite 3 3 3   Feeling bad or failure about yourself  2 1 3   Trouble concentrating 3 2 2   Moving slowly or fidgety/restless 2 2 2   Suicidal thoughts 0 0 0  PHQ-9 Score 20 18 21   Difficult doing work/chores Very difficult - Very difficult  Some recent data might be hidden     GAD-7:  GAD 7 : Generalized Anxiety Score 08/08/2017  Nervous, Anxious, on Edge 3  Control/stop worrying 2  Worry too much - different things 3  Trouble relaxing 3  Restless 2  Easily annoyed or irritable 3  Afraid - awful might happen 2  Total GAD 7 Score 18     Physical Exam General: Awake, alert, oriented Cardiovascular: Regular rate and rhythm, S1 and S2 present, no murmurs auscultated Respiratory: Lung fields clear to auscultation bilaterally  ASSESSMENT/PLAN:   Hyperlipidemia (primary prevention, LDL goal <70) Direct LDL 78, just above goal of <70.  Patient amenable to increasing atorvastatin from 40 mg to 80 mg.  Recheck with future lipid panel after [place] Day.  Essential (primary) hypertension At goal.  Tolerating medications well.  No changes at this time.  Diabetic neuropathy (HCC) Chronic, duration  of several months now.  T2DM uncontrolled, currently working hard at this.  Takes Lyrica but only nightly.  Increase Lyrica to twice daily to provide 24-hour coverage.  Wheelchair dependence Patient requests increase in hours for home aide.  Currently received 2 days a week, would like to receive more.  Reports that she has difficulty cooking and cleaning for herself, but is most concerned with the cooking.  I have looked with Dr. Andria Frames on the [facility] website to find the correct forms to fill out, however am unable to do so.  We will consult care coordination to obtain help with this form     Ezequiel Essex, MD [place]

## 2021-02-25 ENCOUNTER — Ambulatory Visit (INDEPENDENT_AMBULATORY_CARE_PROVIDER_SITE_OTHER): Payer: Medicare Other | Admitting: Family Medicine

## 2021-02-25 ENCOUNTER — Other Ambulatory Visit (HOSPITAL_COMMUNITY): Payer: Self-pay

## 2021-02-25 ENCOUNTER — Other Ambulatory Visit: Payer: Self-pay

## 2021-02-25 ENCOUNTER — Ambulatory Visit: Payer: Medicare Other | Admitting: Pharmacist

## 2021-02-25 ENCOUNTER — Encounter: Payer: Self-pay | Admitting: Family Medicine

## 2021-02-25 VITALS — BP 133/61 | HR 112 | Ht 63.0 in | Wt 347.4 lb

## 2021-02-25 DIAGNOSIS — Z794 Long term (current) use of insulin: Secondary | ICD-10-CM | POA: Diagnosis not present

## 2021-02-25 DIAGNOSIS — E785 Hyperlipidemia, unspecified: Secondary | ICD-10-CM | POA: Diagnosis not present

## 2021-02-25 DIAGNOSIS — M25562 Pain in left knee: Secondary | ICD-10-CM | POA: Diagnosis not present

## 2021-02-25 DIAGNOSIS — J8410 Pulmonary fibrosis, unspecified: Secondary | ICD-10-CM

## 2021-02-25 DIAGNOSIS — E1149 Type 2 diabetes mellitus with other diabetic neurological complication: Secondary | ICD-10-CM | POA: Diagnosis not present

## 2021-02-25 DIAGNOSIS — E1165 Type 2 diabetes mellitus with hyperglycemia: Secondary | ICD-10-CM

## 2021-02-25 DIAGNOSIS — E1169 Type 2 diabetes mellitus with other specified complication: Secondary | ICD-10-CM

## 2021-02-25 DIAGNOSIS — G8929 Other chronic pain: Secondary | ICD-10-CM | POA: Diagnosis not present

## 2021-02-25 DIAGNOSIS — M25561 Pain in right knee: Secondary | ICD-10-CM

## 2021-02-25 DIAGNOSIS — I10 Essential (primary) hypertension: Secondary | ICD-10-CM

## 2021-02-25 DIAGNOSIS — Z993 Dependence on wheelchair: Secondary | ICD-10-CM | POA: Diagnosis not present

## 2021-02-25 DIAGNOSIS — E1140 Type 2 diabetes mellitus with diabetic neuropathy, unspecified: Secondary | ICD-10-CM | POA: Insufficient documentation

## 2021-02-25 LAB — POCT GLYCOSYLATED HEMOGLOBIN (HGB A1C): HbA1c, POC (controlled diabetic range): 10.4 % — AB (ref 0.0–7.0)

## 2021-02-25 MED ORDER — ATORVASTATIN CALCIUM 80 MG PO TABS
80.0000 mg | ORAL_TABLET | Freq: Every day | ORAL | 3 refills | Status: DC
  Filled 2021-02-25: qty 90, 90d supply, fill #0

## 2021-02-25 NOTE — Assessment & Plan Note (Signed)
Patient requests increase in hours for home aide.  Currently received 2 days a week, would like to receive more.  Reports that she has difficulty cooking and cleaning for herself, but is most concerned with the cooking.  I have looked with Dr. [name] on the [facility] website to find the correct forms to fill out, however am unable to do so.  We will consult care coordination to obtain help with this form

## 2021-02-25 NOTE — Assessment & Plan Note (Signed)
Direct LDL 78, just above goal of <70.  Patient amenable to increasing atorvastatin from 40 mg to 80 mg.  Recheck with future lipid panel after [place] Day.

## 2021-02-25 NOTE — Patient Instructions (Signed)
It was wonderful to see you today. Thank you for allowing me to be a part of your care. Below is a short summary of what we discussed at your visit today:  Cholesterol Today your bad cholesterol is really close to goal! It is 78 and we want it below 70.   Increase your atorvastatin from 40 mg to 80 mg. While you are using your old medicine, take two of the 40 mg tablets.   Foot pain Likely diabetic neuropathy, or damage to your nerves from diabetes.   Take your Lyrica twice per day to give you all day coverage.   Home aide I will put in the order for a home aide. Please let us know if you have trouble hearing from the home service company.     Please bring all of your medications to every appointment!  If you have any questions or concerns, please do not hesitate to contact us via phone or MyChart message.   Fayette Pho, MD

## 2021-02-25 NOTE — Assessment & Plan Note (Signed)
Chronic, duration of several months now.  T2DM uncontrolled, currently working hard at this.  Takes Lyrica but only nightly.  Increase Lyrica to twice daily to provide 24-hour coverage.

## 2021-02-25 NOTE — Assessment & Plan Note (Signed)
At goal.  Tolerating medications well.  No changes at this time.

## 2021-02-26 ENCOUNTER — Encounter: Payer: Self-pay | Admitting: Pharmacist

## 2021-02-26 ENCOUNTER — Encounter: Payer: Self-pay | Admitting: Family Medicine

## 2021-02-26 ENCOUNTER — Telehealth: Payer: Self-pay | Admitting: *Deleted

## 2021-02-26 LAB — BASIC METABOLIC PANEL
BUN/Creatinine Ratio: 11 (ref 9–23)
BUN: 9 mg/dL (ref 6–24)
CO2: 27 mmol/L (ref 20–29)
Calcium: 9.6 mg/dL (ref 8.7–10.2)
Chloride: 98 mmol/L (ref 96–106)
Creatinine, Ser: 0.8 mg/dL (ref 0.57–1.00)
Glucose: 162 mg/dL — ABNORMAL HIGH (ref 70–99)
Potassium: 3.7 mmol/L (ref 3.5–5.2)
Sodium: 139 mmol/L (ref 134–144)
eGFR: 88 mL/min/{1.73_m2} (ref >59)

## 2021-02-26 NOTE — Chronic Care Management (AMB) (Signed)
°  Care Management   Note  02/26/2021 Name: [name] MRN: [id number] DOB: 02-23-1965  [name] is a 56 y.o. year old female who is a primary care patient of Fayette Pho, MD. I reached out to [employer] by phone today in response to a referral sent by [name]'s primary care provider.   [name] was given information about care management services today including:  Care management services include personalized support from designated clinical staff supervised by her physician, including individualized plan of care and coordination with other care providers 24/7 contact phone numbers for assistance for urgent and routine care needs. The patient may stop care management services at any time by phone call to the office staff.  Patient agreed to services and verbal consent obtained.   Follow up plan: Telephone appointment with care management team member scheduled for:03/18/21  [facility] Guide, Embedded Care Coordination [facility]   Care Management  Direct Dial: [phone]

## 2021-02-26 NOTE — Addendum Note (Signed)
Addended by: Renard Hamper on: 02/26/2021 09:09 AM   Modules accepted: Orders

## 2021-03-11 ENCOUNTER — Other Ambulatory Visit (HOSPITAL_COMMUNITY): Payer: Self-pay

## 2021-03-11 ENCOUNTER — Encounter: Payer: Self-pay | Admitting: Family Medicine

## 2021-03-11 ENCOUNTER — Other Ambulatory Visit: Payer: Self-pay

## 2021-03-11 ENCOUNTER — Ambulatory Visit (INDEPENDENT_AMBULATORY_CARE_PROVIDER_SITE_OTHER): Payer: Medicare Other | Admitting: Pharmacist

## 2021-03-11 VITALS — Wt 346.0 lb

## 2021-03-11 DIAGNOSIS — E1165 Type 2 diabetes mellitus with hyperglycemia: Secondary | ICD-10-CM

## 2021-03-11 DIAGNOSIS — I10 Essential (primary) hypertension: Secondary | ICD-10-CM | POA: Diagnosis not present

## 2021-03-11 DIAGNOSIS — E785 Hyperlipidemia, unspecified: Secondary | ICD-10-CM

## 2021-03-11 DIAGNOSIS — E0810 Diabetes mellitus due to underlying condition with ketoacidosis without coma: Secondary | ICD-10-CM | POA: Diagnosis not present

## 2021-03-11 DIAGNOSIS — E1169 Type 2 diabetes mellitus with other specified complication: Secondary | ICD-10-CM

## 2021-03-11 DIAGNOSIS — Z794 Long term (current) use of insulin: Secondary | ICD-10-CM | POA: Diagnosis not present

## 2021-03-11 LAB — POCT GLYCOSYLATED HEMOGLOBIN (HGB A1C): HbA1c, POC (controlled diabetic range): 9.5 % — AB (ref 0.0–7.0)

## 2021-03-11 MED ORDER — EMPAGLIFLOZIN 10 MG PO TABS
10.0000 mg | ORAL_TABLET | Freq: Every day | ORAL | 0 refills | Status: DC
  Filled 2021-03-11: qty 90, 90d supply, fill #0

## 2021-03-11 MED ORDER — OZEMPIC (1 MG/DOSE) 4 MG/3ML ~~LOC~~ SOPN
1.0000 mg | PEN_INJECTOR | SUBCUTANEOUS | 0 refills | Status: DC
  Filled 2021-03-11: qty 3, 28d supply, fill #0

## 2021-03-11 MED ORDER — AMLODIPINE BESYLATE 5 MG PO TABS
ORAL_TABLET | ORAL | 0 refills | Status: DC
  Filled 2021-03-11: qty 30, fill #0
  Filled 2021-06-07: qty 30, 30d supply, fill #0

## 2021-03-11 MED ORDER — ATORVASTATIN CALCIUM 40 MG PO TABS
80.0000 mg | ORAL_TABLET | Freq: Every day | ORAL | 3 refills | Status: DC
  Filled 2021-03-11: qty 180, 90d supply, fill #0
  Filled 2021-06-07: qty 180, 90d supply, fill #1
  Filled 2021-09-22: qty 180, 90d supply, fill #2
  Filled 2022-01-05 – 2022-01-06 (×2): qty 180, 90d supply, fill #3

## 2021-03-11 MED ORDER — LANTUS SOLOSTAR 100 UNIT/ML ~~LOC~~ SOPN
55.0000 [IU] | PEN_INJECTOR | Freq: Every day | SUBCUTANEOUS | 3 refills | Status: DC
  Filled 2021-03-11: qty 15, 27d supply, fill #0

## 2021-03-11 NOTE — Patient Instructions (Addendum)
[name] it was a pleasure seeing you today.   Please do the following:   Re-start Jardiance 10mg  once daily in the morning. Increase ozempic to 1mg  on 2/26 and Decrease your Lantus to 55 units once daily. Continue checking blood sugars at home. It's really important that you record these and bring these in to your next doctor's appointment.  Continue making the lifestyle changes we've discussed together during our visit. Diet and exercise play a significant role in improving your blood sugars.  Follow-up with Dr. Valentina Lucks in one month  Hypoglycemia or low blood sugar:   Low blood sugar can happen quickly and may become an emergency if not treated right away.   While this shouldn't happen often, it can be brought upon if you skip a meal or do not eat enough. Also, if your insulin or other diabetes medications are dosed too high, this can cause your blood sugar to go to low.   Warning signs of low blood sugar include: Feeling shaky or dizzy Feeling weak or tired  Excessive hunger Feeling anxious or upset  Sweating even when you aren't exercising  What to do if I experience low blood sugar? Follow the Rule of 15 Check your blood sugar with your meter. If lower than 70, proceed to step 2.  Treat with 15 grams of fast acting carbs which is found in 3-4 glucose tablets. If none are available you can try hard candy, 1 tablespoon of sugar or honey,4 ounces of fruit juice, or 6 ounces of REGULAR soda.  Re-check your sugar in 15 minutes. If it is still below 70, do what you did in step 2 again. If your blood sugar has come back up, go ahead and eat a snack or small meal made up of complex carbs (ex. Whole grains) and protein at this time to avoid recurrence of low blood sugar.

## 2021-03-11 NOTE — Progress Notes (Signed)
Subjective:    Patient ID: [name], female    DOB: Jun 25, 1965, 56 y.o.   MRN: [id number]  HPI Patient is a 56 y.o. female who presents for diabetes management. She is in good spirits. Patient was referred and last seen by Primary Care Provider on 11/24/20. Last seen in pharmacy clinic on 02/04/21.  Patient reports diabetes was diagnosed around 4-5 years ago.   Insurance coverage/medication affordability: UHC Medicare/Medicaid  Current diabetes medications include: holding Jardiance 34m once daily, Lantus 60 units once daily, Ozempic 0.5229monce weekly on Sundays (completed 3 doses) Current blood pressure medications include: Amlodipine 29m61mHCTZ 229m67mrbesartan 150mg629mrent cholesterol medications include: atorvastatin 40mg 38ment states that She is taking her medications as prescribed. Patient denies adherence with medications. Patient states that She misses her medications 2-3 times per month, on average.  Do you feel that your medications are working for you?  "I am not sure"  Have you been experiencing any side effects to the medications prescribed? no  Do you have any problems obtaining medications due to transportation or finances?  no      Patient reported dietary habits: Eats 1 big meal a day (dinner) and snacks throughout the day.  She reports she cannot cook so she eats "whatever I can get." Her snacks consist of fruit, cookies, or chips. She reports she has not had any sweet tea recently and has been drinking more water.  Patient denies hypoglycemic events. Patient denies polyuria (increased urination).  Patient reports polyphagia (increased appetite).  Patient reports polydipsia (increased thirst).  Patient denies neuropathy (nerve pain). Patient denies visual changes. Patient denies self foot exams.   Patient reported glucose readings:  Fasting: 129, 139, 176 Random: 125, 134, 172, 130, 138, 125  Dexcom G6 patient education Person(s)instructed:  patient  Patient taking >1 gram acetaminophen every 6 hours: denies Patient taking hydroxyrea: denies.  Instruction: Patient oriented to three components of Dexcom G6 continuous glucose monitor (sensor, transmitter, receiver/cellphone) Receiver or cellphone: receiver  CGM overview and set-up  Button, touch screen, and icons Power supply and recharging Home screen Date and time Set alarm/alert tone  Interstitial vs. capillary blood glucose readings  When to verify sensor reading with fingerstick blood glucose Blood glucose reading measured every five minutes. Sensor will last 10 days Transmitter will last 90 days and must be reused  Transmitter must be within 20 feet of receiver/cell phone.  Sensor application -- sensor placed on right abdomen Site selection and site prep with alcohol pad Sensor prep-sensor pack and sensor applicator Sensor applied to area away from waistband, scarring, tattoos, irritation, and bones Transmitter sanitized with alcohol pad and inserted into sensor. Starting the sensor: 2 hour warm up before BG readings available Sensor change every 10 days and rotate site Call Dexcom customer service if sensor comes off before 10 days  Safety and Troubleshooting Do a fingerstick blood glucose test if the sensor readings do not match how you feel Remove sensor prior to magnetic resonance imaging (MRI), computed tomography (CT) scan, or high-frequency electrical heat (diathermy) treatment. Do not allow sun screen or insect repellant to come into contact with Dexcom G6. These skin care products may lead for the plastic used in the Dexcom G6 to crack. Dexcom G6 may be worn through a walk-tEnvironmental education officeray not be exposed to an advanced Imaging Technology (AIT) body scanner (also called a millimeter wave scanner) or the baggage x-ray machine. Instead, ask for hand-wanding or full-body pat-down and  visual inspection.  Doses of acetaminophen (Tylenol) >1 gram  every 6 hours may cause false high readings. Hydroxyurea (Hydrea, Droxia) may interfere with accuracy of blood glucose readings from Dexcom G6. Store sensor kit between 36 and 86 degrees Farenheit. Can be refrigerated within this temperature range.  Contact information provided for [facility] customer service and/or trainer.  Objective:   Labs:   Physical Exam Constitutional:      Appearance: She is obese.  Neurological:     Mental Status: She is alert and oriented to person, place, and time.    Review of Systems  Gastrointestinal:  Negative for nausea and vomiting.   Lab Results  Component Value Date   HGBA1C 10.4 (A) 02/25/2021   HGBA1C 10.3 (A) 10/17/2020   HGBA1C 9.8 (A) 04/09/2020   There were no vitals filed for this visit.    Lipid Panel     Component Value Date/Time   CHOL 168 04/09/2020 1725   TRIG 130 04/09/2020 1725   HDL 33 (L) 04/09/2020 1725   CHOLHDL 5.1 (H) 04/09/2020 1725   CHOLHDL 4.5 11/27/2015 1408   VLDL 27 11/27/2015 1408   LDLCALC 111 (H) 04/09/2020 1725   LDLDIRECT 78 11/24/2020 1702    Clinical Atherosclerotic Cardiovascular Disease (ASCVD): No  The 10-year ASCVD risk score (Arnett DK, et al., 2019) is: 17.3%   Values used to calculate the score:     Age: 56 years     Sex: Female     Is Non-Hispanic African American: Yes     Diabetic: Yes     Tobacco smoker: No     Systolic Blood Pressure: 585 mmHg     Is BP treated: Yes     HDL Cholesterol: 33 mg/dL     Total Cholesterol: 168 mg/dL   Assessment/Plan:   T2DM is not controlled likely due to issues accessing medications and sub-optimal lifestyle, but improving based on recent changes and better access to medications. Patient tolerating Ozempic 0.36m well therefore will increase to 177monce weekly working towards maximum dose of 81m58ms tolerated. Will also restart Jardiance 74m46mce daily based on results of recent BMet. Will decrease Lantus from 60 units to 55 units once daily due to  starting Jardiance and increasing Ozempic. Patient educated on purpose, proper use and potential adverse effects of Jardiance. Applauded patient for currently not drinking sweet tea and increasing water consumption. Following instruction patient verbalized understanding of treatment plan.   Decreased dose dose of basal insulin Lantus to 55 units once daily Increased dose of Ozempic to 1mg 9me weekly after patient completes last dose of 0.5mg t581m week Start SGLT2 Jardiance 74mg o37mdaily Extensively discussed pathophysiology of diabetes, dietary effects on blood sugar control, and recommended lifestyle interventions Counseled on s/sx of and management of hypoglycemia Placed CGM Dexcom sensor on patient Patient is due for A1C today  Follow-up appointment one month to review sugar readings. Written patient instructions provided.  This appointment required 30 minutes of direct patient care.  Thank you for involving pharmacy to assist in providing this patient's care.  Patient seen with Emma IvGala MurdochD Candidate.

## 2021-03-11 NOTE — Assessment & Plan Note (Signed)
T2DM is not controlled likely due to issues accessing medications and sub-optimal lifestyle, but improving based on recent changes and better access to medications. Patient tolerating Ozempic 0.5mg  well therefore will increase to 1mg  once weekly working towards maximum dose of 2mg  as tolerated. Will also restart Jardiance 10mg  once daily based on results of recent BMet. Will decrease Lantus from 60 units to 55 units once daily due to starting Jardiance and increasing Ozempic. Patient educated on purpose, proper use and potential adverse effects of Jardiance. Applauded patient for currently not drinking sweet tea and increasing water consumption. Following instruction patient verbalized understanding of treatment plan.   1. Decreased dose dose of basal insulin Lantus to 55 units once daily 2. Increased dose of Ozempic to 1mg  once weekly after patient completes last dose of 0.5mg  this week 3. Start SGLT2 Jardiance 10mg  once daily 4. Extensively discussed pathophysiology of diabetes, dietary effects on blood sugar control, and recommended lifestyle interventions 5. Counseled on s/sx of and management of hypoglycemia 6. Placed CGM Dexcom sensor on patient 7. Patient is due for A1C today  Follow-up appointment one month to review sugar readings.

## 2021-03-12 LAB — LIPID PANEL
Chol/HDL Ratio: 3.7 ratio (ref 0.0–4.4)
Cholesterol, Total: 121 mg/dL (ref 100–199)
HDL: 33 mg/dL — ABNORMAL LOW (ref >39)
LDL Chol Calc (NIH): 66 mg/dL (ref 0–99)
Triglycerides: 119 mg/dL (ref 0–149)
VLDL Cholesterol Cal: 22 mg/dL (ref 5–40)

## 2021-03-13 ENCOUNTER — Other Ambulatory Visit (HOSPITAL_COMMUNITY): Payer: Self-pay

## 2021-03-16 ENCOUNTER — Other Ambulatory Visit (HOSPITAL_COMMUNITY): Payer: Self-pay

## 2021-03-16 ENCOUNTER — Telehealth: Payer: Self-pay

## 2021-03-16 NOTE — Telephone Encounter (Signed)
Received fax from pharmacy, PA needed on atorvastatin 40 mg.  Clinical questions submitted via Cover My Meds.  Waiting on response, could take up to 72 hours.  Cover My Meds info: Key: [id number]  Medication approved through 01/24/2022. Pharmacy called with approval.    Talbot Grumbling, RN

## 2021-03-18 ENCOUNTER — Ambulatory Visit: Payer: Medicare Other | Admitting: Licensed Clinical Social Worker

## 2021-03-18 NOTE — Chronic Care Management (AMB) (Signed)
°  Care Management   Social Work Visit Note  03/18/2021 Name: [name] MRN: [id number] DOB: 01/23/1966  [name] is a 56 y.o. year old female who sees Fayette Pho, MD for primary care. The care management team was consulted for assistance with care management and care coordination needs related to Level of Care Concerns   Patient was given the following information about care management and care coordination services today, agreed to services, and gave verbal consent: 1.care management/care coordination services include personalized support from designated clinical staff supervised by their physician, including individualized plan of care and coordination with other care providers 2. 24/7 contact phone numbers for assistance for urgent and routine care needs. 3. The patient may stop care management/care coordination services at any time by phone call to the office staff.  Engaged with patient by telephone for initial visit in response to provider referral for social work chronic care management and care coordination services.  Assessment: Review of patient history, allergies, and health status during evaluation of patient need for care management/care coordination services.    Interventions:  Patient interviewed and appropriate assessments performed Collaborated with clinical team regarding patient needs  SW and Patient discussed PCS services. Patient is already established with [employer]. Patient is approved for  2.45 hrs of services 7 days a week. Patient advised no aid is willing to work 2.45 hrs a day. Patient is requesting an increase in hrs. SW advised she would send Dr. Larita Fife a message.  Patient received list of agencies from [facility] that are accepting new patients. Patient stated she would call.  No other questions or comments were discussed during this visit.   SDOH (Social Determinants of Health) assessments performed: Yes     Plan:  SW will send  a message to Dr. Larita Fife with patients request.  No further follow up is needed due to patient already having services in place.   Christen Butter, BSW  Social Worker [facility] Care Management  [phone]

## 2021-03-18 NOTE — Patient Instructions (Signed)
Visit Information  Instructions:   Patient was given the following information about care management and care coordination services today, agreed to services, and gave verbal consent: 1.care management/care coordination services include personalized support from designated clinical staff supervised by their physician, including individualized plan of care and coordination with other care providers 2. 24/7 contact phone numbers for assistance for urgent and routine care needs. 3. The patient may stop care management/care coordination services at any time by phone call to the office staff.  Patient verbalizes understanding of instructions and care plan provided today and agrees to view in [place]. Active MyChart status confirmed with patient.    No further follow up is needed.  Milus Height, [place]  Social Worker [facility] Management  [phone]

## 2021-03-24 ENCOUNTER — Other Ambulatory Visit (HOSPITAL_COMMUNITY): Payer: Self-pay

## 2021-03-24 ENCOUNTER — Other Ambulatory Visit: Payer: Self-pay

## 2021-03-24 ENCOUNTER — Telehealth (INDEPENDENT_AMBULATORY_CARE_PROVIDER_SITE_OTHER): Payer: Medicare Other | Admitting: Family Medicine

## 2021-03-24 DIAGNOSIS — M25511 Pain in right shoulder: Secondary | ICD-10-CM

## 2021-03-24 DIAGNOSIS — G8929 Other chronic pain: Secondary | ICD-10-CM | POA: Diagnosis not present

## 2021-03-24 DIAGNOSIS — E1165 Type 2 diabetes mellitus with hyperglycemia: Secondary | ICD-10-CM | POA: Diagnosis not present

## 2021-03-24 DIAGNOSIS — M25532 Pain in left wrist: Secondary | ICD-10-CM | POA: Diagnosis not present

## 2021-03-24 DIAGNOSIS — Z794 Long term (current) use of insulin: Secondary | ICD-10-CM

## 2021-03-24 MED ORDER — WRIST SPLINT/COCK-UP/LEFT L MISC
2 refills | Status: DC
  Filled 2021-03-24 – 2021-07-26 (×2): qty 1, fill #0

## 2021-03-24 NOTE — Progress Notes (Signed)
[facility] Visit  Patient originally scheduled for in-person clinic visit, but had to change  Patient consented to have virtual visit and was identified by name and date of birth. Method of visit: Telephone  Encounter participants: Patient: [name] - located at home Provider: Ezequiel Essex - located at [facility]  Chief Complaint:  right neck, right shoulder, left hand pain  HPI:  Right shoulder pain - 3 to 4 months worsening - weakness with activity - when combing hair, holding on to cane, anything involving grip - some difficulty lifting right arm - needs to prop up right arm if needing to have hand up for prolonged period - previously saw orthopedist for right shoulder, who thought it was a rotator cuff injury and warned her against pulling up to stand  Left wrist and hand pain - previous had nerve conduction study 10/04/2019 which showed right median neuropathy c/w carpal tunnel - never before had nerve conduction study on the left  - left hand numbness and pain in fingers and wrist, but no weakness - left hand pain worse at night, wakes her from sleep  ROS: per HPI  Pertinent PMHx:  Patient Active Problem List   Diagnosis Date Noted   Chronic right shoulder pain 03/24/2021   Left wrist pain 03/24/2021   Diabetic neuropathy ([place]) 02/25/2021   Healthcare maintenance 10/21/2020   Hyperlipidemia (primary prevention, LDL goal <70) 09/04/2020   Wheelchair dependence 05/05/2020   Carpal tunnel syndrome of right wrist, RECURRENT 10/15/2019   Insomnia, idiopathic 09/30/2018   Grade I diastolic dysfunction [id number]   Encounter for chronic pain management 02/11/2014   Diabetes ([place]) 02/11/2014   Cystocele 09/14/2012   Chronic pain of both knees 05/28/2010   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   Allergic rhinitis 05/30/2008   OBESITY HYPOVENTILATION SYNDROME 12/27/2007   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  Respiratory: speaking in full sentences, no dyspnea  Assessment/Plan: Chronic right shoulder pain Chronic, worsening. With phone appointment today, unable to perform physical exam. However, given difficulty raising right arm past 90*, highly suspicious for rotator cuff injury. Will refer to orthopedics.   Left wrist pain Subacute, worsening. Left hand numbness and pain, wakes her from sleep. Hx carpal tunnel on right. Differential includes carpal tunnel and ulnar nerve compression. Recommend wrist splint for night time wear. Referral to neurology for nerve conduction study.    Time spent during visit with patient: 17 minutes  Ezequiel Essex, MD   PGY-2 [facility]

## 2021-03-24 NOTE — Assessment & Plan Note (Addendum)
Subacute, worsening. Left hand numbness and pain, wakes her from sleep. Hx carpal tunnel on right. Differential includes carpal tunnel and ulnar nerve compression. Recommend wrist splint for night time wear. Referral to neurology for nerve conduction study.

## 2021-03-24 NOTE — Assessment & Plan Note (Signed)
Chronic, worsening. With phone appointment today, unable to perform physical exam. However, given difficulty raising right arm past 90*, highly suspicious for rotator cuff injury. Will refer to orthopedics.

## 2021-03-25 ENCOUNTER — Encounter: Payer: Self-pay | Admitting: Neurology

## 2021-03-25 ENCOUNTER — Encounter: Payer: Self-pay | Admitting: Family Medicine

## 2021-04-08 ENCOUNTER — Other Ambulatory Visit (HOSPITAL_COMMUNITY): Payer: Self-pay

## 2021-04-08 ENCOUNTER — Other Ambulatory Visit: Payer: Self-pay | Admitting: Family Medicine

## 2021-04-09 ENCOUNTER — Other Ambulatory Visit: Payer: Self-pay

## 2021-04-09 ENCOUNTER — Other Ambulatory Visit (HOSPITAL_COMMUNITY): Payer: Self-pay

## 2021-04-09 DIAGNOSIS — E119 Type 2 diabetes mellitus without complications: Secondary | ICD-10-CM | POA: Diagnosis not present

## 2021-04-09 DIAGNOSIS — H04129 Dry eye syndrome of unspecified lacrimal gland: Secondary | ICD-10-CM | POA: Diagnosis not present

## 2021-04-09 MED ORDER — REFRESH LACRI-LUBE OP OINT
TOPICAL_OINTMENT | OPHTHALMIC | 3 refills | Status: DC
  Filled 2021-06-07: qty 3.5, 7d supply, fill #0
  Filled 2021-07-26: qty 3.5, fill #0

## 2021-04-09 MED ORDER — DEXCOM G6 TRANSMITTER MISC
3 refills | Status: DC
  Filled 2021-04-09: qty 1, 90d supply, fill #0
  Filled 2021-07-26: qty 1, 90d supply, fill #1
  Filled 2021-10-27: qty 1, 90d supply, fill #2
  Filled 2022-01-22: qty 1, 90d supply, fill #3

## 2021-04-09 MED ORDER — CYCLOSPORINE 0.05 % OP EMUL
1.0000 [drp] | Freq: Two times a day (BID) | OPHTHALMIC | 3 refills | Status: DC
  Filled 2021-04-09: qty 5.5, 25d supply, fill #0
  Filled 2021-07-26: qty 5.5, 25d supply, fill #1

## 2021-04-10 ENCOUNTER — Other Ambulatory Visit: Payer: Self-pay

## 2021-04-13 ENCOUNTER — Other Ambulatory Visit (HOSPITAL_COMMUNITY): Payer: Self-pay

## 2021-04-15 ENCOUNTER — Other Ambulatory Visit (HOSPITAL_COMMUNITY): Payer: Self-pay

## 2021-04-16 ENCOUNTER — Other Ambulatory Visit: Payer: Self-pay

## 2021-04-16 ENCOUNTER — Ambulatory Visit (INDEPENDENT_AMBULATORY_CARE_PROVIDER_SITE_OTHER): Payer: Medicare Other | Admitting: Pharmacist

## 2021-04-16 ENCOUNTER — Other Ambulatory Visit (HOSPITAL_COMMUNITY): Payer: Self-pay

## 2021-04-16 DIAGNOSIS — Z794 Long term (current) use of insulin: Secondary | ICD-10-CM | POA: Diagnosis not present

## 2021-04-16 DIAGNOSIS — E1165 Type 2 diabetes mellitus with hyperglycemia: Secondary | ICD-10-CM

## 2021-04-16 MED ORDER — SEMAGLUTIDE (2 MG/DOSE) 8 MG/3ML ~~LOC~~ SOPN
2.0000 mg | PEN_INJECTOR | SUBCUTANEOUS | 11 refills | Status: DC
  Filled 2021-04-16: qty 3, 28d supply, fill #0
  Filled 2021-05-13: qty 3, 28d supply, fill #1
  Filled 2021-06-07: qty 3, 28d supply, fill #2
  Filled 2021-07-20: qty 3, 28d supply, fill #3
  Filled 2021-08-16: qty 3, 28d supply, fill #4
  Filled 2021-09-22: qty 3, 28d supply, fill #5
  Filled 2021-10-27: qty 3, 28d supply, fill #6
  Filled 2021-11-16: qty 3, 28d supply, fill #7

## 2021-04-16 MED ORDER — LANTUS SOLOSTAR 100 UNIT/ML ~~LOC~~ SOPN
40.0000 [IU] | PEN_INJECTOR | Freq: Every day | SUBCUTANEOUS | 3 refills | Status: DC
  Filled 2021-04-16: qty 15, 37d supply, fill #0
  Filled 2021-07-26: qty 15, 37d supply, fill #1
  Filled 2021-08-30: qty 15, 37d supply, fill #2
  Filled 2021-09-25: qty 15, 37d supply, fill #3

## 2021-04-16 NOTE — Patient Instructions (Addendum)
It was nice to see you today! ? ?Your goal blood sugar is 80-130 before eating and less than 180 after eating. ? ?Medication Changes: ?Increase Ozempic to 2 mg weekly.  ? ?Decrease Lantus to 40 units daily.  ? ?Continue Jardiance 10 mg daily.  ? ?Monitor blood sugars at home and bring your Dexcom to your next visit. Call us if your blood sugars are low.  ? ?Keep up the good work with diet and exercise. Aim for a diet full of vegetables, fruit and lean meats (chicken, [place], fish). Try to limit salt intake by eating fresh or frozen vegetables (instead of canned), rinse canned vegetables prior to cooking and do not add any additional salt to meals.  ? ? ? ? ?

## 2021-04-16 NOTE — Progress Notes (Signed)
? ? ?S:    ? ?Chief Complaint  ?Patient presents with  ? Medication Management  ?  Diabetes f/u   ? ?[name] is a 56 y.o. female who presents for diabetes evaluation, education, and management. PMH is significant for T2DM, diabetic neuropathy, HLD, HTN, obesity, insomnia, OSA, depression. Patient was referred and last seen by Primary Care Provider, Dr. Larita Fife, on 03/24/21 by video visit. She has been followed by Boykin Reaper, PharmD, recently. At last visit, Ozempic was increased to 1 mg, Jardiance was restarted, and [name] Dexcom was placed in clinic.  ? ?Today, she arrives in good spirits and presents in a wheelchair. Denies any adverse effects since restarting Jardiance and increasing Ozempic. Brings [name] Dexcom reader today for download.  ? ?Family/Social History:  ?-Never smoker ? ?Current diabetes medications include: Lantus (insulin glargine) 55 units daily, Jardiance (empagliflozin) 10 mg daily, Ozempic (semaglutide) 1 mg weekly on Sundays ?Current hypertension medications include: amlodipine 5 mg daily, irbesartan 150 mg daily, hydrochlorothiazide 25 mg daily, furosemide 20 mg daily PRN ?Current hyperlipidemia medications include: atorvastatin 80 mg daily ? ?Patient states that She is taking [name] medications as prescribed. Patient reports adherence with medications - uses alarm to remember to take. If she can't remember if she's had it yet for the day she won't take it again in case of doubling up.  ? ?Do you feel that your medications are working for you? yes ?Have you been experiencing any side effects to the medications prescribed? no ?Do you have any problems obtaining medications due to transportation or finances? no ?Insurance coverage: Medicare + Medicaid.  ? ?Patient reports hypoglycemic events. Once overnight likely due to skipping dinner.  ? ?Patient denies nocturia (nighttime urination).  ?Patient reports neuropathy (nerve pain). ?Patient reports visual changes. Saw eye doctor last week and had  prescription changed.  ?Patient reports self foot exams.  ? ?Patient reported dietary habits: Reports that every since she lost having an aide at home, she has been eating more erratically due to not having someone to help [name] cook (not due to a decreased appetite). Has had days where she hasn't eaten anything except for some snacks. [name] son (69 years old) used to help cook but he does not live at home now. Daughter also not in the house. [name] 55 year old son who lives at home does not help prepare meals for [name]. A nurse is coming out tomorrow to do an evaluation to see if she can get access to another aide. Cannot stand for long so she is unable to cook for herself.  ?Recent meals: Fish, cabbage, macaroni and cheese; chicken, mashed potatoes, cole slaw. When she is out of the house she tries to get enough food that can last [name] for a few days after.  ?Snacks: peanut butter crackers, granola bars, chips, Goya cookies ?Drinks: water, Diet sodas, cutting back on sweet tea ? ?Patient-reported exercise habits: hoping to do pool rehab once she has an aide again who can take [name] there ? ?O:  ?Physical Exam ?Vitals reviewed.  ?Constitutional:   ?   Appearance: She is obese.  ?Pulmonary:  ?   Effort: Pulmonary effort is normal.  ?Neurological:  ?   Mental Status: She is alert.  ?Psychiatric:     ?   Mood and Affect: Mood normal.     ?   Behavior: Behavior normal.     ?   Thought Content: Thought content normal.  ? ?Review of Systems  ?All other  systems reviewed and are negative. ? ?Date of Download: 04/16/21 ?% Time CGM is active: 98.7% ?Average Glucose: 129 mg/dL ?Glucose Management Indicator: 6.4  ?Glucose Variability: 16.1 (goal <36%) ?Time in Goal:  ?- Time in range 70-180: 98% ?- Time above range: 1% ?- Time below range: 1% ?Observed patterns: one day of nighttime low ? ?Lab Results  ?Component Value Date  ? HGBA1C 9.5 (A) 03/11/2021  ? ?Vitals:  ? 04/16/21 1546  ?BP: 125/78  ?Pulse: (!) 110  ?SpO2: 98%  ? ? ?Lipid Panel   ?   ?Component Value Date/Time  ? CHOL 121 03/11/2021 1704  ? TRIG 119 03/11/2021 1704  ? HDL 33 (L) 03/11/2021 1704  ? CHOLHDL 3.7 03/11/2021 1704  ? CHOLHDL 4.5 11/27/2015 1408  ? VLDL 27 11/27/2015 1408  ? LDLCALC 66 03/11/2021 1704  ? LDLDIRECT 78 11/24/2020 1702  ? ? ? ?A/P: ?Diabetes longstanding currently uncontrolled with last A1c 9.5 on 03/11/21 but now with BG 98% in range per CGM report. Patient is able to verbalize appropriate hypoglycemia management plan. Medication adherence appears optimal.  ?- Increase Ozempic (semaglutide) from 1 to 2mg  weekly to optimize weight loss benefit (has lost 10 lbs since increasing to 1 mg last month. ?-Decrease Lantus (insulin glargine) to 40 units daily to prevent hypoglycemia.  ?-Continue Jardiance (empagliflozin) 10 mg daily.  ?-Extensively discussed pathophysiology of diabetes, recommended lifestyle interventions, dietary effects on blood sugar control.  ?-Counseled on s/sx of and management of hypoglycemia.  ?-Next A1c anticipated May 2023.  ? ?Written patient instructions provided. Patient verbalized understanding of treatment plan. Total time in face to face counseling 42 minutes.   ? ?Follow up pharmacist clinic visit in 1 month then back with PCP for updated A1c. Patient seen with Bartolo DarterElizabeth Brooks PharmD Candidate, Lissa MerlinBrianna  Beldon, PharmD, PGY 1 Pharmacy Resident and Pervis HockingMadison Yates, PharmD - PGY2 Pharmacy Resident.  ?

## 2021-04-17 NOTE — Progress Notes (Signed)
Reviewed: I agree with the documentation and management of Dr. Koval. 

## 2021-04-17 NOTE — Assessment & Plan Note (Signed)
Diabetes longstanding currently uncontrolled with last A1c 9.5 on 03/11/21 but now with BG 98% in range per CGM report. Patient is able to verbalize appropriate hypoglycemia management plan. Medication adherence appears optimal.  ?- Increase Ozempic (semaglutide) from 1 to 2mg  weekly to optimize weight loss benefit (has lost 10 lbs since increasing to 1 mg last month. ?-Decrease Lantus (insulin glargine) to 40 units daily to prevent hypoglycemia.  ?-Continue Jardiance (empagliflozin) 10 mg daily.  ?

## 2021-04-20 DIAGNOSIS — M24811 Other specific joint derangements of right shoulder, not elsewhere classified: Secondary | ICD-10-CM | POA: Diagnosis not present

## 2021-04-20 DIAGNOSIS — M4692 Unspecified inflammatory spondylopathy, cervical region: Secondary | ICD-10-CM | POA: Diagnosis not present

## 2021-05-01 ENCOUNTER — Other Ambulatory Visit: Payer: Self-pay | Admitting: Family Medicine

## 2021-05-01 ENCOUNTER — Other Ambulatory Visit (HOSPITAL_COMMUNITY): Payer: Self-pay

## 2021-05-04 ENCOUNTER — Other Ambulatory Visit (HOSPITAL_COMMUNITY): Payer: Self-pay

## 2021-05-04 MED ORDER — INSULIN PEN NEEDLE 31G X 5 MM MISC
9 refills | Status: DC
  Filled 2021-05-04: qty 100, 90d supply, fill #0
  Filled 2021-07-26: qty 100, 90d supply, fill #1
  Filled 2021-10-27: qty 100, 100d supply, fill #2
  Filled 2022-02-14: qty 100, 100d supply, fill #3

## 2021-05-13 ENCOUNTER — Other Ambulatory Visit (HOSPITAL_COMMUNITY): Payer: Self-pay

## 2021-05-13 ENCOUNTER — Telehealth: Payer: Self-pay | Admitting: Neurology

## 2021-05-13 ENCOUNTER — Ambulatory Visit (INDEPENDENT_AMBULATORY_CARE_PROVIDER_SITE_OTHER): Payer: Medicare Other | Admitting: Neurology

## 2021-05-13 NOTE — Telephone Encounter (Signed)
Patient called and said [facility] is needing an order to do a stat EMG this week. ?

## 2021-05-13 NOTE — Progress Notes (Signed)
Error - patient was referred for EMG but scheduled for a clinic visit. We are not performing EMGs at our office at this time, information given to patient for other options. ?

## 2021-05-13 NOTE — Telephone Encounter (Signed)
Dr. Delice Lesch recommended the patient do this testing with her PCP as the referring provider. I called patient to let her know. She said this don't make sense and then we were disconnected  ?

## 2021-05-21 ENCOUNTER — Ambulatory Visit: Payer: Medicare Other | Admitting: Pharmacist

## 2021-05-22 ENCOUNTER — Other Ambulatory Visit (HOSPITAL_COMMUNITY): Payer: Self-pay

## 2021-05-22 ENCOUNTER — Ambulatory Visit: Payer: Medicare Other | Admitting: Neurology

## 2021-06-07 ENCOUNTER — Other Ambulatory Visit: Payer: Self-pay | Admitting: Family Medicine

## 2021-06-07 DIAGNOSIS — M174 Other bilateral secondary osteoarthritis of knee: Secondary | ICD-10-CM

## 2021-06-07 DIAGNOSIS — E1165 Type 2 diabetes mellitus with hyperglycemia: Secondary | ICD-10-CM

## 2021-06-08 ENCOUNTER — Other Ambulatory Visit (HOSPITAL_COMMUNITY): Payer: Self-pay

## 2021-06-08 MED ORDER — EMPAGLIFLOZIN 10 MG PO TABS
10.0000 mg | ORAL_TABLET | Freq: Every day | ORAL | 3 refills | Status: DC
  Filled 2021-06-08: qty 90, 90d supply, fill #0
  Filled 2021-09-22: qty 90, 90d supply, fill #1
  Filled 2022-01-05 – 2022-01-06 (×2): qty 90, 90d supply, fill #2
  Filled 2022-04-08: qty 90, 90d supply, fill #3

## 2021-06-08 MED ORDER — OXYCODONE HCL 5 MG PO TABS
ORAL_TABLET | ORAL | 0 refills | Status: DC
  Filled 2021-06-08: qty 30, 7d supply, fill #0

## 2021-06-24 ENCOUNTER — Other Ambulatory Visit (HOSPITAL_COMMUNITY): Payer: Self-pay

## 2021-06-24 ENCOUNTER — Other Ambulatory Visit: Payer: Self-pay | Admitting: Family Medicine

## 2021-06-24 MED ORDER — DEXCOM G6 SENSOR MISC
11 refills | Status: DC
  Filled 2021-06-24: qty 3, 30d supply, fill #0
  Filled 2021-07-20: qty 3, 30d supply, fill #1
  Filled 2021-08-19: qty 3, 30d supply, fill #2
  Filled 2021-09-22: qty 3, 30d supply, fill #3
  Filled 2021-10-27: qty 3, 30d supply, fill #4
  Filled 2021-11-16 – 2021-11-23 (×2): qty 3, 30d supply, fill #5
  Filled 2022-01-05 – 2022-01-06 (×2): qty 3, 30d supply, fill #6
  Filled 2022-02-14: qty 3, 30d supply, fill #7
  Filled 2022-03-19: qty 3, 30d supply, fill #8
  Filled 2022-04-12: qty 3, 30d supply, fill #9
  Filled 2022-05-03 – 2022-05-24 (×2): qty 3, 30d supply, fill #10
  Filled 2022-06-22: qty 3, 30d supply, fill #11

## 2021-06-25 ENCOUNTER — Other Ambulatory Visit (HOSPITAL_COMMUNITY): Payer: Self-pay

## 2021-06-30 ENCOUNTER — Encounter: Payer: Self-pay | Admitting: *Deleted

## 2021-07-20 ENCOUNTER — Other Ambulatory Visit (HOSPITAL_COMMUNITY): Payer: Self-pay

## 2021-07-26 ENCOUNTER — Other Ambulatory Visit: Payer: Self-pay | Admitting: Family Medicine

## 2021-07-26 ENCOUNTER — Other Ambulatory Visit: Payer: Self-pay

## 2021-07-26 DIAGNOSIS — E0810 Diabetes mellitus due to underlying condition with ketoacidosis without coma: Secondary | ICD-10-CM

## 2021-07-26 DIAGNOSIS — I10 Essential (primary) hypertension: Secondary | ICD-10-CM

## 2021-07-27 ENCOUNTER — Other Ambulatory Visit (HOSPITAL_COMMUNITY): Payer: Self-pay

## 2021-07-27 MED ORDER — AMLODIPINE BESYLATE 5 MG PO TABS
ORAL_TABLET | ORAL | 3 refills | Status: DC
  Filled 2021-07-27: qty 90, 90d supply, fill #0
  Filled 2021-09-22: qty 90, 90d supply, fill #1
  Filled 2022-01-05 – 2022-01-06 (×2): qty 90, 90d supply, fill #2
  Filled 2022-04-17: qty 90, 90d supply, fill #3

## 2021-07-27 MED ORDER — FUROSEMIDE 20 MG PO TABS
20.0000 mg | ORAL_TABLET | Freq: Every day | ORAL | 2 refills | Status: DC | PRN
  Filled 2021-07-27: qty 30, 30d supply, fill #0
  Filled 2022-01-05 – 2022-01-06 (×2): qty 30, 30d supply, fill #1
  Filled 2022-05-03: qty 30, 30d supply, fill #2

## 2021-08-05 ENCOUNTER — Other Ambulatory Visit (HOSPITAL_COMMUNITY): Payer: Self-pay

## 2021-08-17 ENCOUNTER — Other Ambulatory Visit (HOSPITAL_COMMUNITY): Payer: Self-pay

## 2021-08-19 ENCOUNTER — Other Ambulatory Visit (HOSPITAL_COMMUNITY): Payer: Self-pay

## 2021-08-19 ENCOUNTER — Other Ambulatory Visit: Payer: Self-pay | Admitting: Family Medicine

## 2021-08-19 DIAGNOSIS — G8929 Other chronic pain: Secondary | ICD-10-CM

## 2021-08-19 DIAGNOSIS — M174 Other bilateral secondary osteoarthritis of knee: Secondary | ICD-10-CM

## 2021-08-20 ENCOUNTER — Other Ambulatory Visit (HOSPITAL_COMMUNITY): Payer: Self-pay

## 2021-08-21 ENCOUNTER — Other Ambulatory Visit (HOSPITAL_COMMUNITY): Payer: Self-pay

## 2021-08-21 MED ORDER — PREGABALIN 75 MG PO CAPS
75.0000 mg | ORAL_CAPSULE | Freq: Two times a day (BID) | ORAL | 2 refills | Status: DC
  Filled 2021-08-21 – 2021-08-30 (×2): qty 60, 30d supply, fill #0
  Filled 2021-09-25 – 2021-09-29 (×2): qty 60, 30d supply, fill #1
  Filled 2021-10-27 – 2021-10-29 (×2): qty 60, 30d supply, fill #2

## 2021-08-21 MED ORDER — OXYCODONE HCL 5 MG PO TABS
ORAL_TABLET | ORAL | 0 refills | Status: DC
  Filled 2021-08-21: qty 30, 7d supply, fill #0

## 2021-08-27 ENCOUNTER — Ambulatory Visit: Payer: Self-pay | Admitting: Licensed Clinical Social Worker

## 2021-08-27 NOTE — Patient Outreach (Signed)
  Care Coordination   Initial Visit Note   08/27/2021 Name: [name] MRN: [id number] DOB: Dec 15, 1965  [name] is a 56 y.o. year old female who sees Sara Pho, MD for primary care. I spoke with  [name] by phone today  What matters to the patients health and wellness today?  Patient in good spirits. SDOH and Needs assessment competed. Patient has no additional needs. No additional interventions are needed. Patient has SW contact information and advised to contact PCP if further assistance is needed. SW has removed self from care team.   SDOH assessments and interventions completed:  Yes     Care Coordination Interventions Activated:  Yes  Care Coordination Interventions:  Yes, provided   Follow up plan: No further intervention required.   Encounter Outcome:  Pt. Visit Completed   [name], MSW  Social Worker [facility]  [phone]

## 2021-08-27 NOTE — Patient Instructions (Signed)
Visit Information  Instructions:   Patient was given the following information about care management and care coordination services today, agreed to services, and gave verbal consent: 1.care management/care coordination services include personalized support from designated clinical staff supervised by their physician, including individualized plan of care and coordination with other care providers 2. 24/7 contact phone numbers for assistance for urgent and routine care needs. 3. The patient may stop care management/care coordination services at any time by phone call to the office staff.  Patient verbalizes understanding of instructions and care plan provided today and agrees to view in MyChart. Active MyChart status and patient understanding of how to access instructions and care plan via MyChart confirmed with patient.     No further follow up required: .  Gurley Climer, BSW , MSW Social Worker [facility] Care Management  [phone]      

## 2021-08-30 ENCOUNTER — Other Ambulatory Visit: Payer: Self-pay

## 2021-08-31 ENCOUNTER — Other Ambulatory Visit: Payer: Self-pay | Admitting: Family Medicine

## 2021-08-31 ENCOUNTER — Other Ambulatory Visit (HOSPITAL_COMMUNITY): Payer: Self-pay

## 2021-08-31 MED ORDER — MONTELUKAST SODIUM 10 MG PO TABS
10.0000 mg | ORAL_TABLET | Freq: Every day | ORAL | 3 refills | Status: DC
  Filled 2021-08-31: qty 90, 90d supply, fill #0
  Filled 2021-11-16: qty 90, 90d supply, fill #1
  Filled 2022-03-19: qty 90, 90d supply, fill #2
  Filled 2022-06-14: qty 90, 90d supply, fill #3

## 2021-09-07 ENCOUNTER — Ambulatory Visit: Payer: Medicare Other | Admitting: Neurology

## 2021-09-09 ENCOUNTER — Ambulatory Visit: Payer: Medicare Other | Admitting: Pharmacist

## 2021-09-09 ENCOUNTER — Ambulatory Visit: Payer: Medicare Other | Admitting: Family Medicine

## 2021-09-22 ENCOUNTER — Other Ambulatory Visit (HOSPITAL_COMMUNITY): Payer: Self-pay

## 2021-09-22 ENCOUNTER — Other Ambulatory Visit: Payer: Self-pay | Admitting: Family Medicine

## 2021-09-22 DIAGNOSIS — J45909 Unspecified asthma, uncomplicated: Secondary | ICD-10-CM

## 2021-09-22 DIAGNOSIS — I10 Essential (primary) hypertension: Secondary | ICD-10-CM

## 2021-09-22 DIAGNOSIS — E0810 Diabetes mellitus due to underlying condition with ketoacidosis without coma: Secondary | ICD-10-CM

## 2021-09-22 MED ORDER — ACETAMINOPHEN 500 MG PO TABS
500.0000 mg | ORAL_TABLET | Freq: Four times a day (QID) | ORAL | 3 refills | Status: AC | PRN
  Filled 2021-09-22: qty 100, 25d supply, fill #0

## 2021-09-22 MED ORDER — BUDESONIDE-FORMOTEROL FUMARATE 160-4.5 MCG/ACT IN AERO
2.0000 | INHALATION_SPRAY | Freq: Two times a day (BID) | RESPIRATORY_TRACT | 8 refills | Status: DC
  Filled 2021-09-22: qty 10.2, 30d supply, fill #0
  Filled 2021-11-16: qty 10.2, 30d supply, fill #1
  Filled 2021-12-21: qty 10.2, 30d supply, fill #2
  Filled 2022-01-22: qty 10.2, 30d supply, fill #3
  Filled 2022-02-14: qty 10.2, 30d supply, fill #4
  Filled 2022-03-19: qty 10.2, 30d supply, fill #5
  Filled 2022-04-17: qty 10.2, 30d supply, fill #6
  Filled 2022-05-03 – 2022-05-24 (×2): qty 10.2, 30d supply, fill #7
  Filled 2022-06-22: qty 10.2, 30d supply, fill #8

## 2021-09-23 ENCOUNTER — Ambulatory Visit (INDEPENDENT_AMBULATORY_CARE_PROVIDER_SITE_OTHER): Payer: Medicare Other | Admitting: Pharmacist

## 2021-09-23 ENCOUNTER — Other Ambulatory Visit (HOSPITAL_COMMUNITY): Payer: Self-pay

## 2021-09-23 ENCOUNTER — Ambulatory Visit (INDEPENDENT_AMBULATORY_CARE_PROVIDER_SITE_OTHER): Payer: Medicare Other | Admitting: Family Medicine

## 2021-09-23 ENCOUNTER — Encounter: Payer: Self-pay | Admitting: Family Medicine

## 2021-09-23 VITALS — BP 132/94 | HR 95 | Ht 63.0 in | Wt 324.0 lb

## 2021-09-23 DIAGNOSIS — Z1159 Encounter for screening for other viral diseases: Secondary | ICD-10-CM

## 2021-09-23 DIAGNOSIS — Z59819 Housing instability, housed unspecified: Secondary | ICD-10-CM | POA: Diagnosis not present

## 2021-09-23 DIAGNOSIS — E1165 Type 2 diabetes mellitus with hyperglycemia: Secondary | ICD-10-CM | POA: Diagnosis not present

## 2021-09-23 DIAGNOSIS — E1149 Type 2 diabetes mellitus with other diabetic neurological complication: Secondary | ICD-10-CM | POA: Diagnosis not present

## 2021-09-23 DIAGNOSIS — F419 Anxiety disorder, unspecified: Secondary | ICD-10-CM | POA: Diagnosis not present

## 2021-09-23 DIAGNOSIS — I10 Essential (primary) hypertension: Secondary | ICD-10-CM | POA: Diagnosis not present

## 2021-09-23 DIAGNOSIS — Z5982 Transportation insecurity: Secondary | ICD-10-CM

## 2021-09-23 DIAGNOSIS — Z794 Long term (current) use of insulin: Secondary | ICD-10-CM

## 2021-09-23 MED ORDER — HYDROXYZINE HCL 50 MG PO TABS
50.0000 mg | ORAL_TABLET | Freq: Three times a day (TID) | ORAL | 0 refills | Status: DC | PRN
  Filled 2021-09-23: qty 30, 5d supply, fill #0

## 2021-09-23 NOTE — Assessment & Plan Note (Signed)
Currently exacerbated by socioeconomic factors out of her control.  Discussed with patient a holistic approach to things that we are able to influence.  Patient is amenable to plan below.  Plan to follow-up in 2 weeks. - Continue Wellbutrin XL 300 mg daily - Start Atarax 50 mg 3 times daily as needed for anxiety episodes - Find therapist or counselor.  Patient instructed on how to go about this. - Referral to care coordination management for social worker.  Requested to help with transportation, housing instability, financial strain, and community support resources.  If able, could help to establish with therapist or counselor in the area. - Follow-up in 2 weeks

## 2021-09-23 NOTE — Assessment & Plan Note (Signed)
Diabetes longstanding and currently well controlled. Patient has excellent able to verbalize appropriate hypoglycemia management plan. Medication adherence appears to be good.  CGM shows excellent glycemic control.  -Continued basal insulin Lantus (insulin glargine) at 40 units once daily.  -Continued GLP-1 Ozempic (semaglutide) at 2mg  daily.  -Patient educated on purpose, proper use, and potential adverse effects.

## 2021-09-23 NOTE — Progress Notes (Signed)
Reviewed: I agree with Dr. Koval's documentation and management. 

## 2021-09-23 NOTE — Patient Instructions (Addendum)
It was wonderful to see you today. Thank you for allowing me to be a part of your care. Below is a short summary of what we discussed at your visit today:  Diabetes A1c today. Results in MyChart.   Anxiety and depression Try atarax for as needed use with anxiety episodes.   I have referred you to our social workers in the care coordination department. They should be reaching out to you soon via phone.   Counseling or therapy For information on therapists, please go to [email or web address]. You can also contact your insurance company to find an in-network therapist.  Blood pressure Come back for follow up of your blood pressure.  Please bring all of your medications to every appointment!  If you have any questions or concerns, please do not hesitate to contact us via phone or MyChart message.   Fayette Pho, MD
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
## 2021-09-23 NOTE — Patient Instructions (Signed)
No change in Diabetes regimen today.

## 2021-09-23 NOTE — Progress Notes (Signed)
    S:     Chief Complaint  Patient presents with   Medication Management    Diabetes, HTN   [name] is a 56 y.o. female who presents for diabetes evaluation, education, and management.  PMH is significant for bilateral knee osteoarthritis, stress/anxiety, hypertension.  Patient was last seen in pharmacy clinic in March 2023.   Last seen by by Primary Care Provider, Dr. Larita Fife by video visit , on 03/24/2021.   At last visit, we increased dose of Ozempic (semaglutide) to 2mg  weekly.  Patient reports adherence with this adjustment and denies any increase in side effects.    Today, patient arrives in fairly good spirits however, has some flattened affect. and presents with assistance of a wheelchair.   Family/Social History: is complicated with her caring and worrying about her children.  One child is incarcerated.   Current diabetes medications include: insulin glargine 40 units daily and semaglutide 2mg  weekly.   Patient reports adherence to taking all medications as prescribed.    Do you feel that your medications are working for you? yes Have you been experiencing any side effects to the medications prescribed? no  Patient denies hypoglycemic events. DEXCOM CGM - Download  today  % Time CGM is active: 93.8% Average Glucose: 130 mg/dL Glucose Management Indicator: 6.4  Glucose Variability: 16.5% (goal <36%) Time in Goal:  - Time in range 70-180: 98% - Time above range: 2% - Time below range: 0% Observed patterns: low variability - no patterns recognized   Patient reported dietary habits: Eats 2-3 meals/day Breakfast: Skips usually Lunch: Sausage, french toast, eggs Dinner: Pizza with sausage and ground beef, Fried chicken with mashed potatoes and green beans Snacks: Peanut butter crackers Drinks: diet soda, no water, cut back on sweet tea but has occassionally    O:   Review of Systems  Musculoskeletal:  Positive for joint pain (bilateral knees).   Psychiatric/Behavioral:  Positive for depression (worry about children).     Physical Exam   Lab Results  Component Value Date   HGBA1C 9.5 (A) 03/11/2021   There were no vitals filed for this visit.  Lipid Panel     Component Value Date/Time   CHOL 121 03/11/2021 1704   TRIG 119 03/11/2021 1704   HDL 33 (L) 03/11/2021 1704   CHOLHDL 3.7 03/11/2021 1704   CHOLHDL 4.5 11/27/2015 1408   VLDL 27 11/27/2015 1408   LDLCALC 66 03/11/2021 1704   LDLDIRECT 78 11/24/2020 1702     A/P: Diabetes longstanding and currently well controlled. Patient has excellent able to verbalize appropriate hypoglycemia management plan. Medication adherence appears to be good.  CGM shows excellent glycemic control.  -Continued basal insulin Lantus (insulin glargine) at 40 units once daily.  -Continued GLP-1 Ozempic (semaglutide) at 2mg  daily.  -Patient educated on purpose, proper use, and potential adverse effects.   Written patient instructions provided. Patient verbalized understanding of treatment plan.  Total time in face to face counseling 25 minutes.    Follow-up:  Pharmacist PRN. PCP clinic visit - TBD by PCP at visit today.  Patient seen with 03/13/2021, PharmD PGY-1 Resident. 11/26/2020

## 2021-09-24 ENCOUNTER — Telehealth: Payer: Self-pay | Admitting: *Deleted

## 2021-09-24 ENCOUNTER — Other Ambulatory Visit (HOSPITAL_COMMUNITY): Payer: Self-pay

## 2021-09-24 MED ORDER — OXYBUTYNIN CHLORIDE 5 MG PO TABS
5.0000 mg | ORAL_TABLET | Freq: Two times a day (BID) | ORAL | 1 refills | Status: DC
  Filled 2021-09-24: qty 60, 30d supply, fill #0
  Filled 2021-11-16: qty 60, 30d supply, fill #1

## 2021-09-24 NOTE — Chronic Care Management (AMB) (Signed)
  Care Coordination   Note   09/24/2021 Name: [name] MRN: [id number] DOB: 04-Oct-1965  [name] is a 56 y.o. year old female who sees Fayette Pho, MD for primary care. I reached out to [employer] by phone today to offer care coordination services.  [name] was given information about Care Coordination services today including:   The Care Coordination services include support from the care team which includes your Nurse Coordinator, Clinical Social Worker, or Pharmacist.  The Care Coordination team is here to help remove barriers to the health concerns and goals most important to you. Care Coordination services are voluntary, and the patient may decline or stop services at any time by request to their care team member.   Care Coordination Consent Status: Patient agreed to services and verbal consent obtained.   Follow up plan:  Telephone appointment with care coordination team member scheduled for:  09/25/21  Encounter Outcome:  Pt. Scheduled  [facility] Coordination Care Guide  Direct Dial: [phone]

## 2021-09-25 ENCOUNTER — Other Ambulatory Visit: Payer: Self-pay | Admitting: Family Medicine

## 2021-09-25 ENCOUNTER — Ambulatory Visit: Payer: Self-pay | Admitting: Licensed Clinical Social Worker

## 2021-09-25 ENCOUNTER — Encounter: Payer: Self-pay | Admitting: Family Medicine

## 2021-09-25 ENCOUNTER — Other Ambulatory Visit (HOSPITAL_COMMUNITY): Payer: Self-pay

## 2021-09-25 DIAGNOSIS — E0810 Diabetes mellitus due to underlying condition with ketoacidosis without coma: Secondary | ICD-10-CM

## 2021-09-25 DIAGNOSIS — R062 Wheezing: Secondary | ICD-10-CM

## 2021-09-25 DIAGNOSIS — I10 Essential (primary) hypertension: Secondary | ICD-10-CM

## 2021-09-25 DIAGNOSIS — D869 Sarcoidosis, unspecified: Secondary | ICD-10-CM

## 2021-09-25 DIAGNOSIS — E1165 Type 2 diabetes mellitus with hyperglycemia: Secondary | ICD-10-CM

## 2021-09-25 DIAGNOSIS — J8410 Pulmonary fibrosis, unspecified: Secondary | ICD-10-CM

## 2021-09-25 DIAGNOSIS — M174 Other bilateral secondary osteoarthritis of knee: Secondary | ICD-10-CM

## 2021-09-25 NOTE — Patient Outreach (Signed)
  Care Coordination   Initial Visit Note   09/25/2021 Name: AISLIN ONOFRE MRN: [id number] DOB: 1965-06-21  [name] is a 56 y.o. year old female who sees Fayette Pho, MD for primary care. I spoke with  Shiloh A Downs by phone today.  What matters to the patients health and wellness today?  Counseling- SW referred patient to [facility] of the [place]. SW also recommended Patient contact [facility] for a list of counselors covered.     Goals Addressed               This Visit's Progress     MSW Care Coordination (pt-stated)        -PCS worker not consistent with showing up for services.SW discussed options with patient.  -Patient concerned her section 8 will be revoked due to excessive items in homes which is against violation.         SDOH assessments and interventions completed:  Yes     Care Coordination Interventions Activated:  Yes  Care Coordination Interventions:  Yes, provided   Follow up plan: Follow up call scheduled for the next 30 days.    Encounter Outcome:  Pt. Visit Completed   Ander Gaster, MSW  Social Worker [facility] Management  [phone]

## 2021-09-25 NOTE — Patient Instructions (Signed)
Visit Information  Instructions: patient will work with SW to address concerns related to Housing, Counseling, PCS services  Patient was given the following information about care management and care coordination services today, agreed to services, and gave verbal consent: 1.care management/care coordination services include personalized support from designated clinical staff supervised by their physician, including individualized plan of care and coordination with other care providers 2. 24/7 contact phone numbers for assistance for urgent and routine care needs. 3. The patient may stop care management/care coordination services at any time by phone call to the office staff.  Patient verbalizes understanding of instructions and care plan provided today and agrees to view in MyChart. Active MyChart status and patient understanding of how to access instructions and care plan via MyChart confirmed with patient.     The care management team will reach out to the patient again over the next 30 days.   Ander Gaster, MSW  Social Worker [facility]  [phone]

## 2021-09-29 ENCOUNTER — Other Ambulatory Visit (HOSPITAL_COMMUNITY): Payer: Self-pay

## 2021-09-29 MED ORDER — BUPROPION HCL ER (XL) 300 MG PO TB24
300.0000 mg | ORAL_TABLET | Freq: Every day | ORAL | 3 refills | Status: DC
  Filled 2021-09-29: qty 90, 90d supply, fill #0
  Filled 2022-01-05 – 2022-01-06 (×2): qty 90, 90d supply, fill #1
  Filled 2022-04-08: qty 90, 90d supply, fill #2
  Filled 2022-07-07: qty 90, 90d supply, fill #3

## 2021-09-29 MED ORDER — IRBESARTAN 150 MG PO TABS
150.0000 mg | ORAL_TABLET | Freq: Every day | ORAL | 3 refills | Status: DC
  Filled 2021-09-29: qty 90, 90d supply, fill #0
  Filled 2022-01-22: qty 90, 90d supply, fill #1
  Filled 2022-04-17: qty 90, 90d supply, fill #2
  Filled 2022-07-15: qty 90, 90d supply, fill #3

## 2021-09-29 MED ORDER — HYDROCHLOROTHIAZIDE 25 MG PO TABS
25.0000 mg | ORAL_TABLET | Freq: Every day | ORAL | 3 refills | Status: DC
  Filled 2021-09-29: qty 90, 90d supply, fill #0
  Filled 2022-01-05 – 2022-01-06 (×2): qty 90, 90d supply, fill #1
  Filled 2022-04-08: qty 90, 90d supply, fill #2
  Filled 2022-07-07: qty 90, 90d supply, fill #3

## 2021-09-29 MED ORDER — OXYCODONE HCL 5 MG PO TABS
ORAL_TABLET | ORAL | 0 refills | Status: DC
  Filled 2021-09-29: qty 30, 7d supply, fill #0

## 2021-09-29 MED ORDER — CETIRIZINE HCL 10 MG PO TABS
10.0000 mg | ORAL_TABLET | Freq: Every day | ORAL | 3 refills | Status: DC
  Filled 2021-09-29: qty 90, 90d supply, fill #0
  Filled 2022-01-05 – 2022-01-06 (×2): qty 90, 90d supply, fill #1
  Filled 2022-04-08: qty 90, 90d supply, fill #2
  Filled 2022-06-14 – 2022-07-07 (×2): qty 90, 90d supply, fill #3

## 2021-09-29 MED ORDER — ALBUTEROL SULFATE HFA 108 (90 BASE) MCG/ACT IN AERS
2.0000 | INHALATION_SPRAY | RESPIRATORY_TRACT | 12 refills | Status: DC | PRN
  Filled 2021-09-29: qty 6.7, 16d supply, fill #0
  Filled 2021-11-16: qty 6.7, 16d supply, fill #1
  Filled 2022-01-05 – 2022-01-06 (×2): qty 6.7, 16d supply, fill #2
  Filled 2022-05-03: qty 6.7, 16d supply, fill #3
  Filled 2022-06-14: qty 6.7, 16d supply, fill #4
  Filled 2022-08-23: qty 6.7, 16d supply, fill #5

## 2021-09-29 MED ORDER — OMEPRAZOLE 40 MG PO CPDR
40.0000 mg | DELAYED_RELEASE_CAPSULE | Freq: Every day | ORAL | 3 refills | Status: DC
  Filled 2021-09-29: qty 90, 90d supply, fill #0
  Filled 2022-01-05 – 2022-01-06 (×2): qty 90, 90d supply, fill #1
  Filled 2022-04-08: qty 90, 90d supply, fill #2
  Filled 2022-07-07: qty 90, 90d supply, fill #3

## 2021-09-29 MED FILL — Glucose Blood Test Strip: 30 days supply | Qty: 100 | Fill #0 | Status: CN

## 2021-09-30 ENCOUNTER — Other Ambulatory Visit (HOSPITAL_COMMUNITY): Payer: Self-pay

## 2021-10-03 ENCOUNTER — Other Ambulatory Visit (HOSPITAL_COMMUNITY): Payer: Self-pay

## 2021-10-19 ENCOUNTER — Other Ambulatory Visit (HOSPITAL_COMMUNITY): Payer: Self-pay

## 2021-10-27 ENCOUNTER — Other Ambulatory Visit (HOSPITAL_COMMUNITY): Payer: Self-pay

## 2021-10-27 ENCOUNTER — Encounter: Payer: Self-pay | Admitting: Family Medicine

## 2021-10-27 MED FILL — Glucose Blood Test Strip: 33 days supply | Qty: 100 | Fill #0 | Status: AC

## 2021-10-29 ENCOUNTER — Other Ambulatory Visit (HOSPITAL_COMMUNITY): Payer: Self-pay

## 2021-11-03 ENCOUNTER — Ambulatory Visit: Payer: Medicare Other | Admitting: Family Medicine

## 2021-11-16 ENCOUNTER — Other Ambulatory Visit (HOSPITAL_COMMUNITY): Payer: Self-pay

## 2021-11-16 ENCOUNTER — Other Ambulatory Visit: Payer: Self-pay | Admitting: Family Medicine

## 2021-11-16 DIAGNOSIS — E1165 Type 2 diabetes mellitus with hyperglycemia: Secondary | ICD-10-CM

## 2021-11-16 DIAGNOSIS — G8929 Other chronic pain: Secondary | ICD-10-CM

## 2021-11-16 DIAGNOSIS — M174 Other bilateral secondary osteoarthritis of knee: Secondary | ICD-10-CM

## 2021-11-16 MED ORDER — DICLOFENAC SODIUM 50 MG PO TBEC
50.0000 mg | DELAYED_RELEASE_TABLET | Freq: Two times a day (BID) | ORAL | 0 refills | Status: DC | PRN
  Filled 2021-11-16: qty 60, 30d supply, fill #0

## 2021-11-16 MED ORDER — LANTUS SOLOSTAR 100 UNIT/ML ~~LOC~~ SOPN
40.0000 [IU] | PEN_INJECTOR | Freq: Every day | SUBCUTANEOUS | 3 refills | Status: DC
  Filled 2021-11-16: qty 15, 37d supply, fill #0
  Filled 2021-12-21: qty 15, 37d supply, fill #1
  Filled 2022-02-14: qty 15, 37d supply, fill #2
  Filled 2022-03-19: qty 15, 37d supply, fill #3

## 2021-11-17 ENCOUNTER — Other Ambulatory Visit (HOSPITAL_COMMUNITY): Payer: Self-pay

## 2021-11-23 ENCOUNTER — Other Ambulatory Visit (HOSPITAL_COMMUNITY): Payer: Self-pay

## 2021-12-07 ENCOUNTER — Encounter: Payer: Self-pay | Admitting: *Deleted

## 2021-12-07 NOTE — Progress Notes (Signed)
[facility] Quality Team Note  Name: [name] Date of Birth: 06/13/1965 MRN: [id number] Date: 12/07/2021  [facility] Quality Team has reviewed this patient's chart, please see recommendations below:  [facility] Quality Other; (Pt has open gap for A1C.  Order was placed on 09/29/2021, no record of completion.  Pt has upcoming appt 12/08/2021.)

## 2021-12-08 ENCOUNTER — Other Ambulatory Visit (HOSPITAL_COMMUNITY): Payer: Self-pay

## 2021-12-08 ENCOUNTER — Ambulatory Visit (INDEPENDENT_AMBULATORY_CARE_PROVIDER_SITE_OTHER): Payer: 59 | Admitting: Family Medicine

## 2021-12-08 ENCOUNTER — Encounter: Payer: Self-pay | Admitting: Family Medicine

## 2021-12-08 VITALS — BP 118/64 | HR 86 | Wt 316.0 lb

## 2021-12-08 DIAGNOSIS — Z6841 Body Mass Index (BMI) 40.0 and over, adult: Secondary | ICD-10-CM

## 2021-12-08 DIAGNOSIS — K5903 Drug induced constipation: Secondary | ICD-10-CM

## 2021-12-08 DIAGNOSIS — Z993 Dependence on wheelchair: Secondary | ICD-10-CM

## 2021-12-08 DIAGNOSIS — Z23 Encounter for immunization: Secondary | ICD-10-CM

## 2021-12-08 DIAGNOSIS — M174 Other bilateral secondary osteoarthritis of knee: Secondary | ICD-10-CM

## 2021-12-08 DIAGNOSIS — Z794 Long term (current) use of insulin: Secondary | ICD-10-CM

## 2021-12-08 DIAGNOSIS — E1165 Type 2 diabetes mellitus with hyperglycemia: Secondary | ICD-10-CM

## 2021-12-08 LAB — POCT GLYCOSYLATED HEMOGLOBIN (HGB A1C): HbA1c, POC (controlled diabetic range): 6.3 % (ref 0.0–7.0)

## 2021-12-08 MED ORDER — SENNA 8.6 MG PO TABS
1.0000 | ORAL_TABLET | Freq: Every day | ORAL | 0 refills | Status: DC
  Filled 2021-12-08: qty 120, 120d supply, fill #0

## 2021-12-08 NOTE — Patient Instructions (Signed)
It was wonderful to see you today. Thank you for allowing me to be a part of your care. Below is a short summary of what we discussed at your visit today:  Knee pain I have referred you to [facility] for talk about knee replacement.  Someone from their office should be calling you in 1 to 2 weeks to schedule an appointment.  If you do not hear from them, let us know. We may need to nudge along the referral.    T2DM Your A1c today was 6.3! THIS IS AMAZING! Keep up the good work! No changes to your medicine today.  Come back in 3 months for your next check  Vaccines Today you received the annual flu vaccine and COVID vaccine.  You may experience some residual soreness at the injection site.  Gentle stretches and regular use of that arm will help speed up your recovery.  As the vaccines are giving your immune system a "practice run" against specific infections, you may feel a little under the weather for the next several days.  We recommend rest as needed and hydrating.   Please bring all of your medications to every appointment!  If you have any questions or concerns, please do not hesitate to contact us via phone or MyChart message.   Fayette Pho, MD

## 2021-12-08 NOTE — Progress Notes (Signed)
SUBJECTIVE:   CHIEF COMPLAINT / HPI:   [name] is a pleasant 56 year old woman who presents today to discuss orthopedic referral for chronic knee pain, check in on diabetes, and discuss constipation.  Chronic knee pain Patient has experienced progressively worsening chronic knee pain bilaterally, although right worse than left.  She has well-established end-stage DJD of bilateral knees.  Now 30-year duration.  She is currently using a motorized wheelchair due to ambulatory pain from her knees.  She would like to discuss referral to orthopedics for surgical consideration.  From a surgical standpoint, she is a better candidate now than previously.  Blood pressure is well controlled.  T2DM also well controlled, A1c today 6.3.  She does have history of postinflammatory pulmonary fibrosis, OSA, and OHS, but is managed well on Symbicort and albuterol.  Her weight remains elevated, BMI today 55, but this has been gradually improving over the years.  Her weight today at 316 pounds is the lowest we have on record for her since 2008.  Previous therapies: Physical therapy Cortisone shots x20 years Steroid shot, possibly 8 to 10 years ago Did try gel injections but did not find them helpful Tramadol without relief OTC ibuprofen  Current therapies: Lyrica 75 mg twice daily Voltaren 50 mg tablet twice daily as needed Oxycodone IR 5 mg every 6 as needed for pain not relieved by Lyrica and diclofenac  Pertinent imaging: XR right knee AP/LAT with sunrise 11/08/2017 Advanced tricompartmental osteoarthritis, worse than the comparison study of 2015.  XR bilateral knees 08/03/2013 Left knee with advanced osteoarthritic changes progressive since 2012 Right knee with progressive tricompartmental degenerative changes, progressive loss of height with the medial compartment  T2DM -A1c today -Current regimen includes atorvastatin 80 mg, Jardiance 10 mg, Lantus 40 units daily, irbesartan 100  mg, semaglutide 2 mg weekly -Documented intolerance to metformin, reports nausea -Reports tolerating her medications well, no adverse side effects -She believes she is getting great control with the real-time glucose feedback from her CGM -No hypoglycemic episodes  Lab Results  Component Value Date   HGBA1C 9.5 (A) 03/11/2021   HGBA1C 10.4 (A) 02/25/2021   HGBA1C 10.3 (A) 10/17/2020   Lab Results  Component Value Date   LDLCALC 66 03/11/2021   CREATININE 0.80 02/25/2021   Constipation For the last 6 months, she is experience constipation.  She reports prior to this, they are moving quite well, had 3 BM in a week.  Now in the last 6 months, she has perhaps 1 BM per week or longer.  BM can be painful, large, and difficult to pass.  No bowel regimen, previously took MiraLAX but does not like the taste.  Water intake throughout the day is minimal, perhaps 1 bottle of water in a day.  She reports that she drinks water before she eats it makes her sick, stomach.  She does drink sweet tea but is cutting back on that.  Reports very little juice and soda.  Pertinent medications include oxycodone 5 mg every 6 as needed and Ozempic 2 mg weekly.  PERTINENT  PMH / PSH: T2DM, diabetic neuropathy, HLD, severe bilateral knee arthritis (end-stage DJD), HTN, GERD, myasthenia gravis   OBJECTIVE:   BP 118/64   Pulse 86   Wt (!) 316 lb (143.3 kg)   SpO2 96%   BMI 55.98 kg/m    PHQ-9:     12/08/2021    4:54 PM 12/08/2021    2:25 PM 09/23/2021   10:32 AM  Depression  screen PHQ 2/9  Decreased Interest 2 2 2   Down, Depressed, Hopeless 1 1 3   PHQ - 2 Score 3 3 5   Altered sleeping 2 2 3   Tired, decreased energy 2 2 3   Change in appetite 3 3 2   Feeling bad or failure about yourself  2 2 2   Trouble concentrating 2 2 3   Moving slowly or fidgety/restless 2 2 1   Suicidal thoughts 0 0 0  PHQ-9 Score 16 16 19   Difficult doing work/chores Extremely dIfficult Extremely dIfficult     Physical  Exam General: Awake, alert, oriented Cardiovascular: Regular rate and rhythm, S1 and S2 present, no murmurs auscultated Respiratory: Lung fields clear to auscultation bilaterally  ASSESSMENT/PLAN:   DJD (degenerative joint disease) of knee End-stage DJD of bilateral knees.  Gradual progression over 30 years.  Patient requests referral to orthopedics for surgical consideration, which I believe is quite reasonable.  She is a much better surgical candidate now than she was previously.  HTN and T2DM under control.  Weights are elevated, but lowest on record.  See diabetes tab for considerations of changing Ozempic to [facility] for weight loss effect. -Refer to [facility] orthopedics, as she has seen them before.  Diabetes (HCC) A1c today 6.3, drastically reduced from 9.5 in February.  Likely attributable to Realtime glucose feedback with CGM.  Continue regimen without any changes at this time: Lantus 40 units daily, Ozempic 2 mg weekly, Jardiance 10 mg.  Discussed possible change from Ozempic to [facility] for weight loss effect, patient concerned about constipation.  I will do some reading and see if is any more constipating than Ozempic.  Drug-induced constipation X6 months.  No current bowel regimen.  Most likely due to oxycodone IR 5 mg every 6 as needed, despite her long-term use of this.  Contributing factors also include minimal water intake.  Wonder if contributing. - Discussed MiraLAX, patient hesitant due to taste - Increase water intake, ultimate goal 6-8 bottles of water in a day but for now would encourage patient to get up to 2 bottles in a day - Senna nightly    , MD [facility]

## 2021-12-09 ENCOUNTER — Telehealth: Payer: Self-pay | Admitting: Family Medicine

## 2021-12-09 ENCOUNTER — Encounter: Payer: Self-pay | Admitting: Family Medicine

## 2021-12-09 DIAGNOSIS — E785 Hyperlipidemia, unspecified: Secondary | ICD-10-CM

## 2021-12-09 DIAGNOSIS — K5903 Drug induced constipation: Secondary | ICD-10-CM

## 2021-12-09 DIAGNOSIS — Z6841 Body Mass Index (BMI) 40.0 and over, adult: Secondary | ICD-10-CM

## 2021-12-09 DIAGNOSIS — E1149 Type 2 diabetes mellitus with other diabetic neurological complication: Secondary | ICD-10-CM

## 2021-12-09 DIAGNOSIS — Z794 Long term (current) use of insulin: Secondary | ICD-10-CM

## 2021-12-09 NOTE — Telephone Encounter (Signed)
Called patient to discuss potentially switching Ozempic to [facility] for weight loss control. No answer, left VM.   Fayette Pho, MD

## 2021-12-09 NOTE — Assessment & Plan Note (Signed)
X6 months.  No current bowel regimen.  Most likely due to oxycodone IR 5 mg every 6 as needed, despite her long-term use of this.  Contributing factors also include minimal water intake.  Wonder if [employer] contributing. - Discussed MiraLAX, patient hesitant due to taste - Increase water intake, ultimate goal 6-8 bottles of water in a day but for now would encourage patient to get up to 2 bottles in a day - Senna nightly

## 2021-12-09 NOTE — Assessment & Plan Note (Signed)
End-stage DJD of bilateral knees.  Gradual progression over 30 years.  Patient requests referral to orthopedics for surgical consideration, which I believe is quite reasonable.  She is a much better surgical candidate now than she was previously.  HTN and T2DM under control.  Weights are elevated, but lowest on record.  See diabetes tab for considerations of changing Ozempic to [facility] for weight loss effect. -Refer to [facility] orthopedics, as she has seen them before.

## 2021-12-09 NOTE — Assessment & Plan Note (Signed)
A1c today 6.3, drastically reduced from 9.5 in February.  Likely attributable to Realtime glucose feedback with CGM.  Continue regimen without any changes at this time: Lantus 40 units daily, Ozempic 2 mg weekly, Jardiance 10 mg.  Discussed possible change from Ozempic to [facility] for weight loss effect, patient concerned about constipation.  I will do some reading and see if Greggory Keen is any more constipating than Ozempic.

## 2021-12-11 ENCOUNTER — Other Ambulatory Visit (HOSPITAL_COMMUNITY): Payer: Self-pay

## 2021-12-11 MED ORDER — POLYETHYLENE GLYCOL 3350 17 GM/SCOOP PO POWD
17.0000 g | Freq: Every day | ORAL | 11 refills | Status: DC
  Filled 2021-12-11: qty 3094, 90d supply, fill #0

## 2021-12-11 MED ORDER — MOUNJARO 2.5 MG/0.5ML ~~LOC~~ SOAJ
2.5000 mg | SUBCUTANEOUS | 0 refills | Status: DC
  Filled 2021-12-11: qty 2, 28d supply, fill #0

## 2021-12-16 DIAGNOSIS — M1712 Unilateral primary osteoarthritis, left knee: Secondary | ICD-10-CM | POA: Diagnosis not present

## 2021-12-16 DIAGNOSIS — M1711 Unilateral primary osteoarthritis, right knee: Secondary | ICD-10-CM | POA: Diagnosis not present

## 2021-12-21 ENCOUNTER — Other Ambulatory Visit: Payer: Self-pay | Admitting: Family Medicine

## 2021-12-21 ENCOUNTER — Encounter: Payer: Self-pay | Admitting: Family Medicine

## 2021-12-21 ENCOUNTER — Ambulatory Visit: Payer: Self-pay | Admitting: Licensed Clinical Social Worker

## 2021-12-21 ENCOUNTER — Other Ambulatory Visit (HOSPITAL_COMMUNITY): Payer: Self-pay

## 2021-12-21 DIAGNOSIS — G8929 Other chronic pain: Secondary | ICD-10-CM

## 2021-12-21 DIAGNOSIS — M174 Other bilateral secondary osteoarthritis of knee: Secondary | ICD-10-CM

## 2021-12-21 DIAGNOSIS — Z993 Dependence on wheelchair: Secondary | ICD-10-CM

## 2021-12-21 MED FILL — Glucose Blood Test Strip: 33 days supply | Qty: 100 | Fill #1 | Status: AC

## 2021-12-22 ENCOUNTER — Other Ambulatory Visit (HOSPITAL_COMMUNITY): Payer: Self-pay

## 2021-12-22 MED ORDER — PREGABALIN 75 MG PO CAPS
75.0000 mg | ORAL_CAPSULE | Freq: Two times a day (BID) | ORAL | 2 refills | Status: DC
  Filled 2021-12-22: qty 60, 30d supply, fill #0
  Filled 2022-01-22: qty 60, 30d supply, fill #1

## 2021-12-22 MED ORDER — OXYCODONE HCL 5 MG PO TABS
ORAL_TABLET | ORAL | 0 refills | Status: DC
  Filled 2021-12-22: qty 30, 8d supply, fill #0

## 2021-12-22 MED ORDER — DICLOFENAC SODIUM 50 MG PO TBEC
50.0000 mg | DELAYED_RELEASE_TABLET | Freq: Two times a day (BID) | ORAL | 0 refills | Status: DC | PRN
  Filled 2021-12-22: qty 60, 30d supply, fill #0

## 2021-12-24 NOTE — Patient Outreach (Signed)
  Care Coordination   Follow Up Visit Note   12/24/2021 Name: [name] MRN: [id number] DOB: 17-Feb-1965  [name] is a 56 y.o. year old female who sees Fayette Pho, MD for primary care.   SW removed self from care team on today. Patient has no additional needs or barriers. Patients understands to contact PCP if additional needs arise.      SDOH assessments and interventions completed:  Yes     Care Coordination Interventions:  Yes, provided   Follow up plan: No further intervention required.   Encounter Outcome:  Pt. Visit Completed

## 2021-12-30 ENCOUNTER — Ambulatory Visit: Payer: Medicare Other | Admitting: Family Medicine

## 2022-01-05 ENCOUNTER — Encounter: Payer: Self-pay | Admitting: Family Medicine

## 2022-01-05 ENCOUNTER — Other Ambulatory Visit (HOSPITAL_COMMUNITY): Payer: Self-pay

## 2022-01-05 ENCOUNTER — Other Ambulatory Visit: Payer: Self-pay | Admitting: Family Medicine

## 2022-01-05 DIAGNOSIS — F419 Anxiety disorder, unspecified: Secondary | ICD-10-CM

## 2022-01-05 DIAGNOSIS — M174 Other bilateral secondary osteoarthritis of knee: Secondary | ICD-10-CM

## 2022-01-05 DIAGNOSIS — I10 Essential (primary) hypertension: Secondary | ICD-10-CM

## 2022-01-05 DIAGNOSIS — E0810 Diabetes mellitus due to underlying condition with ketoacidosis without coma: Secondary | ICD-10-CM

## 2022-01-05 DIAGNOSIS — G8929 Other chronic pain: Secondary | ICD-10-CM

## 2022-01-05 MED ORDER — DEXCOM G6 RECEIVER DEVI
24 refills | Status: DC
  Filled 2022-01-05 – 2022-01-07 (×2): qty 1, 90d supply, fill #0
  Filled 2022-04-08 – 2022-08-23 (×10): qty 1, 90d supply, fill #1

## 2022-01-05 MED ORDER — OXYBUTYNIN CHLORIDE 5 MG PO TABS
5.0000 mg | ORAL_TABLET | Freq: Two times a day (BID) | ORAL | 1 refills | Status: DC
  Filled 2022-01-05 – 2022-01-06 (×2): qty 60, 30d supply, fill #0
  Filled 2022-02-14: qty 60, 30d supply, fill #1

## 2022-01-05 MED ORDER — HYDROXYZINE HCL 50 MG PO TABS
50.0000 mg | ORAL_TABLET | Freq: Three times a day (TID) | ORAL | 2 refills | Status: DC | PRN
  Filled 2022-01-05 – 2022-01-06 (×2): qty 30, 5d supply, fill #0
  Filled 2022-08-23: qty 30, 5d supply, fill #1

## 2022-01-06 ENCOUNTER — Other Ambulatory Visit (HOSPITAL_COMMUNITY): Payer: Self-pay

## 2022-01-06 ENCOUNTER — Other Ambulatory Visit: Payer: Self-pay

## 2022-01-07 ENCOUNTER — Encounter: Payer: Self-pay | Admitting: Physician Assistant

## 2022-01-07 ENCOUNTER — Ambulatory Visit (INDEPENDENT_AMBULATORY_CARE_PROVIDER_SITE_OTHER): Payer: Medicare Other | Admitting: Physician Assistant

## 2022-01-07 ENCOUNTER — Other Ambulatory Visit (HOSPITAL_COMMUNITY): Payer: Self-pay

## 2022-01-07 VITALS — Ht 63.0 in | Wt 316.0 lb

## 2022-01-07 DIAGNOSIS — M1711 Unilateral primary osteoarthritis, right knee: Secondary | ICD-10-CM

## 2022-01-07 DIAGNOSIS — M25562 Pain in left knee: Secondary | ICD-10-CM

## 2022-01-07 DIAGNOSIS — G8929 Other chronic pain: Secondary | ICD-10-CM | POA: Diagnosis not present

## 2022-01-07 NOTE — Progress Notes (Signed)
Office Visit Note   Patient: [name]           Date of Birth: [birth year]           MRN: [id number] Visit Date: 01/07/2022              Requested by: Billey Co, MD [address] [place],  [place] [address] PCP: Fayette Pho, MD   Assessment & Plan: Visit Diagnoses:  1. Unilateral primary osteoarthritis, right knee   2. Chronic pain of left knee     Plan:  Discussed with patient that her BMI being 55.9 puts her at higher risk of having a complication knee replacement.  In fact she is part of the [facility] which does not allow for total joints.  Performed on folks over a BMI of 40.  Feel that she would definitely benefit from going to physical therapy.  Also discussed the benefits of weight loss on pressure on the knees.  Were happy to see her back in the office in 3 months.  Height and weight check where she can follow-up with [facility].  Questions were encouraged and answered at length.  Follow-Up Instructions: Return in about 3 months (around 04/08/2022).   Orders:  No orders of the defined types were placed in this encounter.  No orders of the defined types were placed in this encounter.     Procedures: No procedures performed   Clinical Data: No additional findings.   Subjective: No chief complaint on file.   HPI Patient is 56 year old female comes in today with bilateral knee pain second opinion.  She has been told that she has arthritis in both knees.  She was seen at [facility] orth[facility] recently and told she needed to work on strengthening her legs and they have actually ordered therapy.  They also discussed with her that she needed to work on weight loss which she is doing.  She states she has had bilateral knee pain for some 30 years.  No known injury.  Radiographs in 2019 are available and show severe tricompartmental arthritis of the right knee.  She does take diclofenac Tylenol Lyrica and oxycodone for her knee pain but  states this really helped.  She is also tried injections both cortisone and gel injections without any real relief.  She notes that she has been basically dependent upon a motorized wheelchair for the last 2 years.  She does ambulate short distances though.  She states prior to the motorized wheelchair she was walking with a walker.  She is lost from 430 pounds down to 316 pounds.  States due to her myasthenia gravis she has been on prednisone.  Has gained a lot of weight over the years.  She has also had weakness in the extremities for years.  Reports that her diabetes is under good control at this point in time. Review of Systems See HPI otherwise negative  Objective: Vital Signs: Ht 5\' 3"  (1.6 m)   Wt (!) 316 lb (143.3 kg)   BMI 55.98 kg/m   Physical Exam Constitutional:      Appearance: She is not ill-appearing or diaphoretic.  Pulmonary:     Effort: Pulmonary effort is normal.  Neurological:     Mental Status: She is alert and oriented to person, place, and time.  Psychiatric:        Mood and Affect: Mood normal.     Ortho Exam Bilateral knees she lacks approximately 15 degrees of full extension  on the left knee and 7 degrees full extension right knee both knees bent to approximately 85 degrees.  Passive range of motion reveals significant patellofemoral crepitus bilaterally.  No gross instability valgus varus stressing of either knee.  No abnormal warmth erythema of either knee.  Large soft tissue envelope bilateral knees. Specialty Comments:  No specialty comments available.  Imaging: No results found.   PMFS History: Patient Active Problem List   Diagnosis Date Noted   Drug-induced constipation 12/09/2021   Anxiety 09/23/2021   Chronic right shoulder pain 03/24/2021   Left wrist pain 03/24/2021   Diabetic neuropathy (HCC) 02/25/2021   Healthcare maintenance 10/21/2020   Hyperlipidemia (primary prevention, LDL goal <70) 09/04/2020   Wheelchair dependence 05/05/2020    Carpal tunnel syndrome of right wrist, RECURRENT 10/15/2019   Insomnia, idiopathic 09/30/2018   Grade I diastolic dysfunction 09/30/2018   Encounter for chronic pain management 02/11/2014   Diabetes (HCC) 02/11/2014   Cystocele 09/14/2012   Chronic pain of both knees 05/28/2010   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   Allergic rhinitis 05/30/2008   OBESITY HYPOVENTILATION SYNDROME 12/27/2007   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   Obstructive sleep apnea 10/30/2007   BMI 50.0-59.9, adult (HCC) 04/10/2007   MYASTHENIA 03/07/2007   Depression, recurrent (HCC) 03/24/2006   Essential (primary) hypertension 03/24/2006   Gastro-esophageal reflux disease without esophagitis 03/24/2006   Past Medical History:  Diagnosis Date   ALLERGIC RHINITIS    takes Zyrtec daily   Anxiety    Arthritis    Asthma    Albuterol prn;Symbicort daily and Singulair daily   Bronchitis    hx of   CAP (community acquired pneumonia) 02/20/2015   Carpal tunnel syndrome    right   Chronic back pain    Constipation    Miralax prn   Depression    takes Cymbalta daily   Diabetes (HCC)    type 2    Dizziness    pt states she gets off balance occasionally   Eczema    Gallstones 2010   GERD (gastroesophageal reflux disease)    takes Omeprazole daily   Headache(784.0)    couple of times a week   Hemorrhoids    Hypertension    takes Prinizide daily and Clonidine on Mondays   Hypoventilation associated with obesity syndrome (HCC)    Insomnia    Irritable bladder    Joint pain    Joint swelling    Lung disease    scleraderma   Morbid (severe) obesity due to excess calories (HCC) 04/10/2007   Restrictive changes on pfts 11/30/2007     Myasthenia gravis 1994   Positive Acetylcholine receptor Ab and single fiber EMG Dr. [facility] 1996- Dr. 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 Coralyn Helling- 10/30/2007 ANA positive, ANA titer  negative, RF less than 20   Rheumatoid arthritis(714.0)    Sarcoidosis 12/23/2010   Derm  dx NCG  12/03/10 (path report in  EPIC)    Overview:  Overview:  Diagnosed on skin biopsy November 2012  Last Assessment & Plan:  Labs are unremarkable. CXR improved/stable. PFT wnl. I am happy patient had these tests and we have a baseline for her. At this time, I do not see an indication for starting chronic steroids daily. Patient agrees. Her skin lesions are bothering her the most and syst   Shortness of breath    can be sitting as well as exertion   Skin irritation    skin itchy   Trigger finger    Urinary frequency    takes Ditropan daily   Urinary urgency    Viral meningitis    history of viral meningitis    Family History  Problem Relation Age of Onset   Hypertension Father    Sickle cell trait Father    Diabetes Father        Borderline   Stroke Paternal Grandmother    Sickle cell trait Paternal Grandfather    Diabetes Paternal Grandfather    Lupus Other        Mother's first cousin   Crohn's disease Paternal Aunt    Diabetes Maternal Grandmother    Anesthesia problems Neg Hx    Hypotension Neg Hx    Malignant hyperthermia Neg Hx    Pseudochol deficiency Neg Hx    Colon cancer Neg Hx    Colon polyps Neg Hx    Heart disease Neg Hx    Kidney disease Neg Hx    Esophageal cancer Neg Hx    Gallbladder disease Neg Hx     Past Surgical History:  Procedure Laterality Date   BRONCHOSCOPY  08/2007   CARPAL TUNNEL RELEASE  05/20/2011   Procedure: CARPAL TUNNEL RELEASE;  Surgeon: [name], MD;  Location: [facility] OR;  Service: Orthopedics;  Laterality: Right;   CARPAL TUNNEL RELEASE Right 11/22/2019   Procedure: RIGHT HAND CARPAL TUNNEL RELEASE;  Surgeon: Jones Broom, MD;  Location: [facility] ORS;  Service: Orthopedics;  Laterality: Right;   CESAREAN SECTION  09/15/2004   COLONOSCOPY N/A 01/21/2014   Procedure: COLONOSCOPY;  Surgeon: Iva Boop, MD;  Location: [facility] ENDOSCOPY;  Service: Endoscopy;  Laterality: N/A;   cortisone injection     receives an injection every 71months   ESOPHAGOGASTRODUODENOSCOPY     ESOPHAGOGASTRODUODENOSCOPY N/A 01/21/2014   Procedure: ESOPHAGOGASTRODUODENOSCOPY (EGD);  Surgeon: Iva Boop, MD;  Location: [facility];  Service: Endoscopy;  Laterality: N/A;   LAPAROSCOPIC CHOLECYSTECTOMY  07/2008   by Dr. Cyndia Bent   Thymus resection  08/25/1993   TRIGGER FINGER RELEASE  05/20/2011   Procedure: RELEASE TRIGGER FINGER/A-1 PULLEY;  Surgeon: [name], MD;  Location: [facility] OR;  Service: Orthopedics;  Laterality: Right;  RIGHT TRIGGER THUMB RELEASE AND RIGHT CARPAL TUNNEL RELEASE   Social History   Occupational History   Occupation: Disabled  Tobacco Use   Smoking status: Never   Smokeless tobacco: Never  Vaping Use   Vaping Use: Never used  Substance and Sexual Activity   Alcohol use: No   Drug use: No   Sexual activity: Yes    Birth control/protection: Injection

## 2022-01-08 ENCOUNTER — Other Ambulatory Visit (HOSPITAL_COMMUNITY): Payer: Self-pay

## 2022-01-08 ENCOUNTER — Telehealth: Payer: Medicare Other | Admitting: Family Medicine

## 2022-01-08 ENCOUNTER — Telehealth (INDEPENDENT_AMBULATORY_CARE_PROVIDER_SITE_OTHER): Payer: Medicare Other | Admitting: Family Medicine

## 2022-01-08 ENCOUNTER — Encounter: Payer: Self-pay | Admitting: Family Medicine

## 2022-01-08 DIAGNOSIS — E1149 Type 2 diabetes mellitus with other diabetic neurological complication: Secondary | ICD-10-CM | POA: Diagnosis not present

## 2022-01-08 DIAGNOSIS — M25532 Pain in left wrist: Secondary | ICD-10-CM

## 2022-01-08 DIAGNOSIS — M174 Other bilateral secondary osteoarthritis of knee: Secondary | ICD-10-CM | POA: Diagnosis not present

## 2022-01-08 DIAGNOSIS — G5602 Carpal tunnel syndrome, left upper limb: Secondary | ICD-10-CM | POA: Diagnosis not present

## 2022-01-08 DIAGNOSIS — E1165 Type 2 diabetes mellitus with hyperglycemia: Secondary | ICD-10-CM | POA: Diagnosis not present

## 2022-01-08 DIAGNOSIS — E1169 Type 2 diabetes mellitus with other specified complication: Secondary | ICD-10-CM

## 2022-01-08 DIAGNOSIS — E785 Hyperlipidemia, unspecified: Secondary | ICD-10-CM

## 2022-01-08 DIAGNOSIS — Z794 Long term (current) use of insulin: Secondary | ICD-10-CM

## 2022-01-08 DIAGNOSIS — Z6841 Body Mass Index (BMI) 40.0 and over, adult: Secondary | ICD-10-CM | POA: Diagnosis not present

## 2022-01-08 MED ORDER — MOUNJARO 5 MG/0.5ML ~~LOC~~ SOAJ
5.0000 mg | SUBCUTANEOUS | 0 refills | Status: DC
  Filled 2022-01-08: qty 2, 28d supply, fill #0

## 2022-01-08 NOTE — Assessment & Plan Note (Signed)
Patient tolerated Mounjaro 2.5 mg weekly x 2 weeks thus far with no adverse side effects.  Denies nausea, vomiting, abdominal pain.  Will complete 2.5 mg weekly x 4 weeks then increase to 5 mg weekly.  Rx sent.  Patient to reach out via phone or MyChart end of January regarding how the 5 mg weekly is doing for her.  If tolerating well, will send prescription for 7.5 mg weekly.  Follow-up scheduled in February, we will plan for A1c and weight check at that time.

## 2022-01-08 NOTE — Assessment & Plan Note (Signed)
Consistent with carpal tunnel.  Recommend anti-inflammatory, Aleve twice daily, and resplinting.  Patient to obtain wrist splint from [facility].

## 2022-01-08 NOTE — Patient Instructions (Signed)
It was wonderful to see you today. Thank you for allowing me to be a part of your care. Below is a short summary of what we discussed at your visit today:  Diabetes I'm glad you are doing well on the Mounjaro 2.5 mg weekly.  Continue this once a week and complete the 4 weeks of this dose.  After you are out of that, start the Mounjaro at 5 mg weekly.  I have sent this prescription into your pharmacy.  If you do well with the 5 mg weekly, simply send me a MyChart message and let me know.  At that time, I will send in a prescription for the 7.5 mg weekly doses.  Our next appointment to check in on your A1c and weight will be in February.  Please see your MyChart app for the scheduling information.   Please bring all of your medications to every appointment!  If you have any questions or concerns, please do not hesitate to contact us via phone or MyChart message.   Fayette Pho, MD

## 2022-01-08 NOTE — Progress Notes (Signed)
[facility] Telemedicine Visit  Patient consented to have virtual visit and was identified by name and date of birth. Method of visit: Video  Encounter participants: Patient: [name] - located at home Provider: Fayette Pho - located at [facility] Others (if applicable): n/a  Chief Complaint: T2DM, knee OA, left wrist pain  HPI:  T2DM 3rd week Mounjaro is tomorrow So far tolerating well, no n/v or abdominal pain Still eating well without post-prandial pain A little decrease in appetite  Lab Results  Component Value Date   HGBA1C 6.3 12/08/2021   HGBA1C 9.5 (A) 03/11/2021   HGBA1C 10.4 (A) 02/25/2021   Lab Results  Component Value Date   LDLCALC 66 03/11/2021   CREATININE 0.80 02/25/2021   Bilateral knee arthritis Starts physical therapy at the end of December Has seen orthopedics and second opinion orthopedics, both of whom reports they cannot operate until BMI <40 due to insurance coverage issues otherwise  Left hand pain  History of bilateral carpal tunnel, this feels just like this S/p right carpal tunnel release x 2 with Dr. Ave Filter Does not currently have carpal tunnel brace  ROS: per HPI  Pertinent PMHx: Recurrent carpal tunnel bilateral wrists, DJD of bilateral knees with wheelchair dependence, postinflammatory pulmonary fibrosis, OSA, BMI 55, myasthenia, T2DM, HTN, diabetic neuropathy  Exam:  There were no vitals taken for this visit.  Respiratory: Speaking clearly in full sentences, no respiratory distress  Assessment/Plan:  Diabetes (HCC) Patient tolerated Mounjaro 2.5 mg weekly x 2 weeks thus far with no adverse side effects.  Denies nausea, vomiting, abdominal pain.  Will complete 2.5 mg weekly x 4 weeks then increase to 5 mg weekly.  Rx sent.  Patient to reach out via phone or MyChart end of January regarding how the 5 mg weekly is doing for her.  If tolerating well, will send prescription for 7.5 mg weekly.  Follow-up  scheduled in February, we will plan for A1c and weight check at that time.  DJD (degenerative joint disease) of knee Ortho cannot operate due to BMI <40.  We will continue with physical therapy and Mounjaro.  Next visit February.  Patient was previously seen nutrition and does not desire nutrition re-consult at this time.  Left wrist pain Consistent with carpal tunnel.  Recommend anti-inflammatory, Aleve twice daily, and resplinting.  Patient to obtain wrist splint from [facility].    Time spent during visit with patient: 15 minutes  Fayette Pho, MD  PGY-3 [facility]

## 2022-01-08 NOTE — Assessment & Plan Note (Signed)
Ortho cannot operate due to BMI <40.  We will continue with physical therapy and Mounjaro.  Next visit February.  Patient was previously seen nutrition and does not desire nutrition re-consult at this time.

## 2022-01-15 ENCOUNTER — Other Ambulatory Visit: Payer: Self-pay | Admitting: Family Medicine

## 2022-01-15 ENCOUNTER — Encounter: Payer: Self-pay | Admitting: Family Medicine

## 2022-01-15 DIAGNOSIS — E1165 Type 2 diabetes mellitus with hyperglycemia: Secondary | ICD-10-CM

## 2022-01-15 DIAGNOSIS — G5602 Carpal tunnel syndrome, left upper limb: Secondary | ICD-10-CM

## 2022-01-15 DIAGNOSIS — Z6841 Body Mass Index (BMI) 40.0 and over, adult: Secondary | ICD-10-CM

## 2022-01-15 NOTE — Progress Notes (Signed)
Referral to sports med for carpal tunnel injection. Hoping for ASAP. Please refer to clinic note from 12/15.   Fayette Pho, MD

## 2022-01-20 ENCOUNTER — Ambulatory Visit: Payer: Medicare Other | Attending: Family Medicine

## 2022-01-20 ENCOUNTER — Other Ambulatory Visit: Payer: Self-pay

## 2022-01-20 DIAGNOSIS — M6281 Muscle weakness (generalized): Secondary | ICD-10-CM | POA: Diagnosis not present

## 2022-01-20 DIAGNOSIS — G8929 Other chronic pain: Secondary | ICD-10-CM | POA: Diagnosis not present

## 2022-01-20 DIAGNOSIS — M25562 Pain in left knee: Secondary | ICD-10-CM | POA: Insufficient documentation

## 2022-01-20 DIAGNOSIS — M25561 Pain in right knee: Secondary | ICD-10-CM | POA: Diagnosis not present

## 2022-01-20 NOTE — Patient Instructions (Signed)
Aquatic Therapy at [facility]-  What to Expect!  Where:   [facility] [address] [facility] [place], [place] [address] Rehab phone [phone]  NOTE:  You will receive an automated phone message reminding you of your appt and it will say the appointment is at the [address] [facility] [address] [facility] clinic.          How to Prepare: Please make sure you drink 8 ounces of water about one hour prior to your pool session A caregiver may attend if needed with the patient to help assist as needed. A caregiver can sit in the pool room on chair. Please arrive IN YOUR SUIT and 15 minutes prior to your appointment - this helps to avoid delays in starting your session. Please make sure to attend to any toileting needs prior to entering the pool Locker rooms for changing are provided.   There is direct access to the pool deck form the locker room.  You can lock your belongings in a locker with lock provided. Once on the pool deck your therapist will ask if you have signed the Patient  Consent and Assignment of Benefits form before beginning treatment Your therapist may take your blood pressure prior to, during and after your session if indicated We usually try and create a home exercise program based on activities we do in the pool.  Please be thinking about who might be able to assist you in the pool should you need to participate in an aquatic home exercise program at the time of discharge if you need assistance.  Some patients do not want to or do not have the ability to participate in an aquatic home program - this is not a barrier in any way to you participating in aquatic therapy as part of your current therapy plan! After Discharge from PT, you can continue using home program at  the  Aquatic Center/, there is a drop-in fee for $5 ($45 a month)or for 60 years  or older $4.00 ($40 a month for seniors ) or any local [employer] pool.  Memberships for purchase are  available for gym/pool at [facility]  IT IS VERY IMPORTANT THAT YOUR LAST VISIT BE IN THE CLINIC AT [address] AFTER YOUR LAST AQUATIC VISIT.  PLEASE MAKE SURE THAT YOU HAVE A [address]  APPOINTMENT SCHEDULED.   About the pool: Pool is located approximately 500 FT from the entrance of the building.  Please bring a support person if you need assistance traveling this      distance.   Your therapist will assist you in entering the water; there are two ways to           enter: stairs with railings, and a mechanical lift. Your therapist will determine the most appropriate way for you.  Water temperature is usually between 88-90 degrees  There may be up to 2 other swimmers in the pool at the same time  The pool deck is tile, please wear shoes with good traction if you prefer not to be barefoot.    Contact Info:  For appointment scheduling and cancellations:         Please call the [facility]  PH:[phone]              Aquatic Therapy  [facility]patient [facility]       All sessions are 45 minutes                                                    

## 2022-01-20 NOTE — Therapy (Unsigned)
OUTPATIENT PHYSICAL THERAPY LOWER EXTREMITY EVALUATION   Patient Name: [name] MRN: [id number] DOB:1965/10/15, 56 y.o., female Today's Date: 01/21/2022  END OF SESSION:  PT End of Session - 01/20/22 1215     Visit Number 1    Number of Visits 12    Date for PT Re-Evaluation 03/18/22    Authorization Type [employer]    PT Start Time 1710    PT Stop Time 1745    PT Time Calculation (min) 35 min    Activity Tolerance Patient limited by pain    Behavior During Therapy WFL for tasks assessed/performed             Past Medical History:  Diagnosis Date   ALLERGIC RHINITIS    takes Zyrtec daily   Anxiety    Arthritis    Asthma    Albuterol prn;Symbicort daily and Singulair daily   Bronchitis    hx of   CAP (community acquired pneumonia) 02/20/2015   Carpal tunnel syndrome    right   Chronic back pain    Constipation    Miralax prn   Depression    takes Cymbalta daily   Diabetes (HCC)    type 2    Dizziness    pt states she gets off balance occasionally   Eczema    Gallstones 2010   GERD (gastroesophageal reflux disease)    takes Omeprazole daily   Headache(784.0)    couple of times a week   Hemorrhoids    Hypertension    takes Prinizide daily and Clonidine on Mondays   Hypoventilation associated with obesity syndrome (HCC)    Insomnia    Irritable bladder    Joint pain    Joint swelling    Lung disease    scleraderma   Morbid (severe) obesity due to excess calories (HCC) 04/10/2007   Restrictive changes on pfts 11/30/2007     Myasthenia gravis 1994   Positive Acetylcholine receptor Ab and single fiber EMG Dr. Clarisa [facility]red [facility] 1996- Dr. 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 Coralyn Helling- 10/30/2007 ANA positive, ANA titer  negative, RF less than 20   Rheumatoid arthritis(714.0)    Sarcoidosis 12/23/2010   Derm  dx NCG  12/03/10 (path report in EPIC)    Overview:  Overview:  Diagnosed on skin biopsy November 2012  Last Assessment & Plan:  Labs are unremarkable. CXR improved/stable. PFT wnl. I am happy patient had these tests and we have a baseline for her. At this time, I do not see an indication for starting chronic steroids daily. Patient agrees. Her skin lesions are bothering her the most and syst   Shortness of breath    can be sitting as well as exertion   Skin irritation    skin itchy   Trigger finger    Urinary frequency    takes Ditropan daily   Urinary urgency    Viral meningitis    history of viral meningitis  Past Surgical History:  Procedure Laterality Date   BRONCHOSCOPY  08/2007   CARPAL TUNNEL RELEASE  05/20/2011   Procedure: CARPAL TUNNEL RELEASE;  Surgeon: Nita Sells, MD;  Location: [place];  Service: Orthopedics;  Laterality: Right;   CARPAL TUNNEL RELEASE Right 11/22/2019   Procedure: RIGHT HAND CARPAL TUNNEL RELEASE;  Surgeon: Tania Ade, MD;  Location: [facility];  Service: Orthopedics;  Laterality: Right;   CESAREAN SECTION  09/15/2004   COLONOSCOPY N/A 01/21/2014   Procedure: COLONOSCOPY;  Surgeon: Gatha Mayer, MD;  Location: [facility];  Service: Endoscopy;  Laterality: N/A;   cortisone injection     receives an injection every 29months   ESOPHAGOGASTRODUODENOSCOPY     ESOPHAGOGASTRODUODENOSCOPY N/A 01/21/2014   Procedure: ESOPHAGOGASTRODUODENOSCOPY (EGD);  Surgeon: Gatha Mayer, MD;  Location: [facility];  Service: Endoscopy;  Laterality: N/A;   LAPAROSCOPIC CHOLECYSTECTOMY  07/2008   by Dr. Neldon Mc   Thymus resection  08/25/1993   TRIGGER  FINGER RELEASE  05/20/2011   Procedure: RELEASE TRIGGER FINGER/A-1 PULLEY;  Surgeon: Nita Sells, MD;  Location: [place];  Service: Orthopedics;  Laterality: Right;  RIGHT TRIGGER THUMB RELEASE AND RIGHT CARPAL TUNNEL RELEASE   Patient Active Problem List   Diagnosis Date Noted   Drug-induced constipation 12/09/2021   Anxiety 09/23/2021   Chronic right shoulder pain 03/24/2021   Left wrist pain 03/24/2021   Diabetic neuropathy ([place]) 02/25/2021   Healthcare maintenance 10/21/2020   Hyperlipidemia (primary prevention, LDL goal <70) 09/04/2020   Wheelchair dependence 05/05/2020   Carpal tunnel syndrome of right wrist, RECURRENT 10/15/2019   Insomnia, idiopathic 09/30/2018   Grade I diastolic dysfunction [id number]   Encounter for chronic pain management 02/11/2014   Diabetes ([place]) 02/11/2014   Cystocele 09/14/2012   Chronic pain of both knees 05/28/2010   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   Allergic rhinitis 05/30/2008   Carpal tunnel syndrome of left wrist 01/17/2008   OBESITY HYPOVENTILATION SYNDROME 12/27/2007   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   Obstructive sleep apnea 10/30/2007   BMI 50.0-59.9, adult ([place]) 04/10/2007   MYASTHENIA 03/07/2007   Depression, recurrent ([place]) 03/24/2006   Essential (primary) hypertension 03/24/2006   Gastro-esophageal reflux disease without esophagitis 03/24/2006    PCP: Ezequiel Essex, MD   REFERRING PROVIDER: Gentry Fitz, MD   REFERRING DIAG: BILATERAL KNEE OSTEOARTHRITIS :HIP MUSCLE ; KNEE FLEXION  THERAPY DIAG: BILATERAL KNEE OSTEOARTHRITIS :HIP MUSCLE ; KNEE FLEXION  Rationale for Evaluation and Treatment: Rehabilitation  ONSET DATE: chronic  SUBJECTIVE:   SUBJECTIVE STATEMENT: Patient reporting chronic B knee pain, no relief with conservative measures to date, no relieving factors identified.  Awaiting clearance for TKA pending achieving BMI goals  PERTINENT  HISTORY: HPI Patient is 56 year old female comes in today with bilateral knee pain second opinion.  She has been told that she has arthritis in both knees.  She was seen at [facility] recently and told she needed to work on strengthening her legs and they have actually ordered therapy.  They also discussed with her that she needed to work on weight loss which she is doing.  She states she has had bilateral knee pain for some 30 years.  No known injury.  Radiographs in 2019 are available and show severe tricompartmental arthritis of the right knee.  She does take diclofenac Tylenol Lyrica and oxycodone for her knee pain but states this really helped.  She is also tried injections both cortisone and gel injections without any real  relief.  She notes that she has been basically dependent upon a motorized wheelchair for the last 2 years.  She does ambulate short distances though.  She states prior to the motorized wheelchair she was walking with a walker.  She is lost from 430 pounds down to 316 pounds.  States due to her myasthenia gravis she has been on prednisone.  Has gained a lot of weight over the years.  She has also had weakness in the extremities for years.  Reports that her diabetes is under good control at this point in time. PAIN:  Are you having pain? Yes: NPRS scale: 10/10 Pain location: B knees Pain description: ache Aggravating factors: everything Relieving factors: nothing  PRECAUTIONS: Fall  WEIGHT BEARING RESTRICTIONS: No  FALLS:  Has patient fallen in last 6 months? No  LIVING ENVIRONMENT: Lives with: lives with their family  OCCUPATION: not working  PLOF: Independent with household mobility with device  PATIENT GOALS: No personal goals stated, "MD wants my legs stretched out"  NEXT MD VISIT: 1 year  OBJECTIVE:   DIAGNOSTIC FINDINGS: CLINICAL DATA:  56 year old female with a history of chronic right knee pain   EXAM: RIGHT KNEE 3 VIEWS   COMPARISON:   08/03/2013   FINDINGS: No acute displaced fracture. Advanced joint space narrowing of the mediolateral compartment with marginal osteophyte formation, sclerotic changes. Degenerative changes of the patellofemoral joint. Changes have not progressed since the comparison of 08/03/2013   IMPRESSION: Advanced tricompartmental osteoarthritis, worse than the comparison study of 2015.   No acute bony abnormality identified.     Electronically Signed   By: Corrie Mckusick D.O.   On: 11/09/2017 09:34    PATIENT SURVEYS:  FOTO 21(43 predicted)   COGNITION: Overall cognitive status: Within functional limits for tasks assessed     SENSATION: Not tested  MUSCLE LENGTH: UTA due to pain levels  POSTURE:  UTA due to poor standing tolerance  PALPATION: deferred  LOWER EXTREMITY ROM:  Active ROM Right eval Left eval  Hip flexion    Hip extension    Hip abduction    Hip adduction    Hip internal rotation    Hip external rotation    Knee flexion 92d 100d  Knee extension -25d -25  Ankle dorsiflexion    Ankle plantarflexion    Ankle inversion    Ankle eversion     (Blank rows = not tested)  LOWER EXTREMITY MMT:  MMT Right eval Left eval  Hip flexion 3 3  Hip extension    Hip abduction 3 3  Hip adduction    Hip internal rotation    Hip external rotation    Knee flexion 3+ 3+  Knee extension 3+ 3+  Ankle dorsiflexion    Ankle plantarflexion 3+ 3+  Ankle inversion    Ankle eversion     (Blank rows = not tested)  LOWER EXTREMITY SPECIAL TESTS:  UTA due to pain and limited tolerance to testing positions   FUNCTIONAL TESTS:  30 seconds chair stand test 0 reps  GAIT: Distance walked: 33ft Assistive device utilized: Wheelchair (power) Level of assistance: Complete Independence Comments: Patient using power chair for mobility   TODAY'S TREATMENT:  DATE: 01/20/22    PATIENT EDUCATION:  Education details: Discussed eval findings, rehab rationale and POC and patient is in agreement  Person educated: Patient Education method: Explanation Education comprehension: verbalized understanding and needs further education  HOME EXERCISE PROGRAM: Access Code: [email or web address] URL: [email or web address] Date: 01/20/2022 Prepared by: Sharlynn Oliphant  Exercises - Seated Long Arc Quad  - 5 x daily - 5 x weekly - 1 sets - 10 reps - Standing Heel Raise with Support  - 5 x daily - 5 x weekly - 1 sets - 10 reps  ASSESSMENT:  CLINICAL IMPRESSION: Patient is a 56 y.o. female who was seen today for physical therapy evaluation and treatment for Chronic B knee pain.   Patient presents with suspected contractures on B knees limiting extension primarily.  Patient limited with all activities due to pain.  She is able to transfer independently using UE support, stands using [facility], ambulation of room to room distances with B canes, uses power chair for mobility outside the home.  Patient has a fair potential to achieve goals due to chronicity of symptoms, co-morbidities and history of unsuccessful conservative interventions including PT.   Patient to initiate aquatic PT for 4 weeks then transition to land based treatment for 2 additional weeks.  OBJECTIVE IMPAIRMENTS: Abnormal gait, decreased activity tolerance, decreased balance, decreased endurance, decreased knowledge of condition, decreased knowledge of use of DME, decreased mobility, difficulty walking, decreased ROM, decreased strength, obesity, and pain.   ACTIVITY LIMITATIONS: carrying, lifting, standing, squatting, stairs, and transfers  PERSONAL FACTORS: Age, Fitness, Past/current experiences, and Time since onset of injury/illness/exacerbation are also affecting patient's functional outcome.   REHAB POTENTIAL: Fair based on chronicity, unsuccessful conservative interventions and advanced OA  of B knees  CLINICAL DECISION MAKING: Stable/uncomplicated  EVALUATION COMPLEXITY: Low   GOALS: Goals reviewed with patient? No  SHORT TERM GOALS: Target date: 02/03/2022   Patient to demonstrate independence in HEP  Baseline:[email or web address] Goal status: INITIAL  2.  Initiate aquatic program Baseline: TBD Goal status: INITIAL    LONG TERM GOALS: Target date: 02/17/2022    Increase BLE strength to 4-/5 in deficit areas Baseline:  MMT Right eval Left eval  Hip flexion 3 3  Hip extension    Hip abduction 3 3  Hip adduction    Hip internal rotation    Hip external rotation    Knee flexion 3+ 3+  Knee extension 3+ 3+  Ankle dorsiflexion    Ankle plantarflexion 3+ 3+   Goal status: INITIAL  2.  Increase FOTO score to 43 Baseline: 21 Goal status: INITIAL  3.  Increase B knee extension AROM to -15d B Baseline: -25d B Goal status: INITIAL    PLAN:  PT FREQUENCY: 1-2x/week  PT DURATION: 4 weeks  PLANNED INTERVENTIONS: Therapeutic exercises, Therapeutic activity, Neuromuscular re-education, Balance training, Gait training, Patient/Family education, Self Care, Joint mobilization, Stair training, DME instructions, and Re-evaluation  PLAN FOR NEXT SESSION: HEP review and update, ROM, strengthening, gait training and functional activities   Lanice Shirts, PT 01/21/2022, 12:16 PM

## 2022-01-22 ENCOUNTER — Other Ambulatory Visit: Payer: Self-pay

## 2022-01-22 MED FILL — Glucose Blood Test Strip: 33 days supply | Qty: 100 | Fill #2 | Status: AC

## 2022-01-28 ENCOUNTER — Ambulatory Visit: Payer: 59 | Attending: Family Medicine | Admitting: Physical Therapy

## 2022-01-28 ENCOUNTER — Encounter: Payer: Self-pay | Admitting: Physical Therapy

## 2022-01-28 DIAGNOSIS — G8929 Other chronic pain: Secondary | ICD-10-CM | POA: Diagnosis not present

## 2022-01-28 DIAGNOSIS — M6281 Muscle weakness (generalized): Secondary | ICD-10-CM | POA: Diagnosis not present

## 2022-01-28 DIAGNOSIS — M25561 Pain in right knee: Secondary | ICD-10-CM | POA: Insufficient documentation

## 2022-01-28 DIAGNOSIS — M25562 Pain in left knee: Secondary | ICD-10-CM | POA: Diagnosis not present

## 2022-01-28 DIAGNOSIS — M79642 Pain in left hand: Secondary | ICD-10-CM | POA: Diagnosis not present

## 2022-01-28 NOTE — Therapy (Signed)
OUTPATIENT PHYSICAL THERAPY TREATMENT NOTE   Patient Name: [name] MRN: [id number] DOB:1965/12/08, 57 y.o., female Today's Date: 01/29/2022  PCP: Fayette Pho, MD    REFERRING PROVIDER: Otho Darner, MD    PT End of Session - 01/28/22 1615     Visit Number 2    Number of Visits 12    Date for PT Re-Evaluation 03/18/22    Authorization Type [employer]    PT Start Time 0415    PT Stop Time 0456    PT Time Calculation (min) 41 min    Activity Tolerance Patient limited by pain    Behavior During Therapy [facility] for tasks assessed/performed             Past Medical History:  Diagnosis Date   ALLERGIC RHINITIS    takes Zyrtec daily   Anxiety    Arthritis    Asthma    Albuterol prn;Symbicort daily and Singulair daily   Bronchitis    hx of   CAP (community acquired pneumonia) 02/20/2015   Carpal tunnel syndrome    right   Chronic back pain    Constipation    Miralax prn   Depression    takes Cymbalta daily   Diabetes (HCC)    type 2    Dizziness    pt states she gets off balance occasionally   Eczema    Gallstones 2010   GERD (gastroesophageal reflux disease)    takes Omeprazole daily   Headache(784.0)    couple of times a week   Hemorrhoids    Hypertension    takes Prinizide daily and Clonidine on Mondays   Hypoventilation associated with obesity syndrome (HCC)    Insomnia    Irritable bladder    Joint pain    Joint swelling    Lung disease    scleraderma   Morbid (severe) obesity due to excess calories (HCC) 04/10/2007   Restrictive changes on pfts 11/30/2007     Myasthenia gravis 1994   Positive Acetylcholine receptor Ab and single fiber EMG Dr. Clarisa [facility]- Dr. 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 Coralyn Helling- 10/30/2007 ANA positive, ANA titer  negative, RF less than 20   Rheumatoid arthritis(714.0)    Sarcoidosis 12/23/2010   Derm  dx NCG  12/03/10 (path report in EPIC)    Overview:  Overview:  Diagnosed on skin biopsy November 2012  Last Assessment & Plan:  Labs are unremarkable. CXR improved/stable. PFT wnl. I am happy patient had these tests and we have a baseline for her. At this time, I do not see an indication for starting chronic steroids daily. Patient agrees. Her skin lesions are bothering her the most and syst   Shortness of breath    can be sitting as well as exertion   Skin irritation    skin itchy   Trigger finger    Urinary frequency    takes Ditropan daily   Urinary urgency  Viral meningitis    history of viral meningitis   Past Surgical History:  Procedure Laterality Date   BRONCHOSCOPY  08/2007   CARPAL TUNNEL RELEASE  05/20/2011   Procedure: CARPAL TUNNEL RELEASE;  Surgeon: Nita Sells, MD;  Location: [place];  Service: Orthopedics;  Laterality: Right;   CARPAL TUNNEL RELEASE Right 11/22/2019   Procedure: RIGHT HAND CARPAL TUNNEL RELEASE;  Surgeon: Tania Ade, MD;  Location: [facility];  Service: Orthopedics;  Laterality: Right;   CESAREAN SECTION  09/15/2004   COLONOSCOPY N/A 01/21/2014   Procedure: COLONOSCOPY;  Surgeon: Gatha Mayer, MD;  Location: [facility];  Service: Endoscopy;  Laterality: N/A;   cortisone injection     receives an injection every 53months   ESOPHAGOGASTRODUODENOSCOPY     ESOPHAGOGASTRODUODENOSCOPY N/A 01/21/2014   Procedure: ESOPHAGOGASTRODUODENOSCOPY (EGD);  Surgeon: Gatha Mayer, MD;  Location: [facility];  Service: Endoscopy;  Laterality: N/A;   LAPAROSCOPIC CHOLECYSTECTOMY  07/2008   by Dr. Neldon Mc    Thymus resection  08/25/1993   TRIGGER FINGER RELEASE  05/20/2011   Procedure: RELEASE TRIGGER FINGER/A-1 PULLEY;  Surgeon: Nita Sells, MD;  Location: [place];  Service: Orthopedics;  Laterality: Right;  RIGHT TRIGGER THUMB RELEASE AND RIGHT CARPAL TUNNEL RELEASE   Patient Active Problem List   Diagnosis Date Noted   Drug-induced constipation 12/09/2021   Anxiety 09/23/2021   Chronic right shoulder pain 03/24/2021   Left wrist pain 03/24/2021   Diabetic neuropathy ([place]) 02/25/2021   Healthcare maintenance 10/21/2020   Hyperlipidemia (primary prevention, LDL goal <70) 09/04/2020   Wheelchair dependence 05/05/2020   Carpal tunnel syndrome of right wrist, RECURRENT 10/15/2019   Insomnia, idiopathic 09/30/2018   Grade I diastolic dysfunction 16/10/9602   Encounter for chronic pain management 02/11/2014   Diabetes ([place]) 02/11/2014   Cystocele 09/14/2012   Chronic pain of both knees 05/28/2010   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   Allergic rhinitis 05/30/2008   Carpal tunnel syndrome of left wrist 01/17/2008   OBESITY HYPOVENTILATION SYNDROME 12/27/2007   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   Obstructive sleep apnea 10/30/2007   BMI 50.0-59.9, adult ([place]) 04/10/2007   MYASTHENIA 03/07/2007   Depression, recurrent ([place]) 03/24/2006   Essential (primary) hypertension 03/24/2006   Gastro-esophageal reflux disease without esophagitis 03/24/2006    THERAPY DIAG:  Chronic pain of left knee  Chronic pain of right knee  Muscle weakness (generalized)   Rationale for Evaluation and Treatment Rehabilitation  REFERRING DIAG: BILATERAL KNEE OSTEOARTHRITIS :HIP MUSCLE ; KNEE FLEXION   PERTINENT HISTORY:  HPI Patient is 57 year old female comes in today with bilateral knee pain second opinion.  She has been told that she has arthritis in both knees.  She was seen at [facility] recently and told she needed to work on strengthening her  legs and they have actually ordered therapy.  They also discussed with her that she needed to work on weight loss which she is doing.  She states she has had bilateral knee pain for some 30 years.  No known injury.  Radiographs in 2019 are available and show severe tricompartmental arthritis of the right knee.  She does take diclofenac Tylenol Lyrica and oxycodone for her knee pain but states this really helped.  She is also tried injections both cortisone and gel injections without any real relief.  She notes that she has been basically dependent upon a motorized wheelchair for the last 2 years.  She does ambulate short distances though.  She  states prior to the motorized wheelchair she was walking with a walker.  She is lost from 430 pounds down to 316 pounds.  States due to her myasthenia gravis she has been on prednisone.  Has gained a lot of weight over the years.  She has also had weakness in the extremities for years.  Reports that her diabetes is under good control at this point in time.  PRECAUTIONS/RESTRICTIONS:   FALL  SUBJECTIVE:  Pt reports her knees are "shot" and she is in constant pain.  PAIN:  Are you having pain? Yes: NPRS scale: 10/10 Pain location: B knees Pain description: ache Aggravating factors: everything Relieving factors: nothing  OBJECTIVE: (objective measures completed at initial evaluation unless otherwise dated)  DIAGNOSTIC FINDINGS: CLINICAL DATA:  57 year old female with a history of chronic right knee pain   EXAM: RIGHT KNEE 3 VIEWS   COMPARISON:  08/03/2013   FINDINGS: No acute displaced fracture. Advanced joint space narrowing of the mediolateral compartment with marginal osteophyte formation, sclerotic changes. Degenerative changes of the patellofemoral joint. Changes have not progressed since the comparison of 08/03/2013   IMPRESSION: Advanced tricompartmental osteoarthritis, worse than the comparison study of 2015.   No acute bony abnormality  identified.     Electronically Signed   By: Corrie Mckusick D.O.   On: 11/09/2017 09:34     PATIENT SURVEYS:  FOTO 21(43 predicted)    COGNITION: Overall cognitive status: Within functional limits for tasks assessed                         SENSATION: Not tested   MUSCLE LENGTH: UTA due to pain levels   POSTURE:  UTA due to poor standing tolerance   PALPATION: deferred   LOWER EXTREMITY ROM:   Active ROM Right eval Left eval  Hip flexion      Hip extension      Hip abduction      Hip adduction      Hip internal rotation      Hip external rotation      Knee flexion 92d 100d  Knee extension -25d -25  Ankle dorsiflexion      Ankle plantarflexion      Ankle inversion      Ankle eversion       (Blank rows = not tested)   LOWER EXTREMITY MMT:   MMT Right eval Left eval  Hip flexion 3 3  Hip extension      Hip abduction 3 3  Hip adduction      Hip internal rotation      Hip external rotation      Knee flexion 3+ 3+  Knee extension 3+ 3+  Ankle dorsiflexion      Ankle plantarflexion 3+ 3+  Ankle inversion      Ankle eversion       (Blank rows = not tested)   LOWER EXTREMITY SPECIAL TESTS:  UTA due to pain and limited tolerance to testing positions    FUNCTIONAL TESTS:  30 seconds chair stand test 0 reps   GAIT: Distance walked: 42ft Assistive device utilized: Wheelchair (power) Level of assistance: Complete Independence Comments: Patient using power chair for mobility     TODAY'S TREATMENT:  DATE: 01/20/22      PATIENT EDUCATION:  Education details: Discussed eval findings, rehab rationale and POC and patient is in agreement  Person educated: Patient Education method: Explanation Education comprehension: verbalized understanding and needs further education   HOME EXERCISE PROGRAM: Access Code: [email or web address] URL:  [email or web address] Date: 01/20/2022 Prepared by: Gustavus Bryant   Exercises - Seated Long Arc Quad  - 5 x daily - 5 x weekly - 1 sets - 10 reps - Standing Heel Raise with Support  - 5 x daily - 5 x weekly - 1 sets - 10 reps    TREATMENT 01/28/21:  Aquatic therapy at [facility]- [address] - therapeutic pool temp 92 degrees Pt enters building independently.  Treatment took place in water 3.8 to  4 ft 8 in.feet deep depending upon activity.  Pt entered and exited the pool via stair and handrails    Aquatic Therapy:  Water walking for warm up  Stretching: Runners stretch on bottom step x30" BIL Hamstring stretch on bottom step x30" BIL  At edge of pool, pt performed LE exercise: Heel raises - 2x20 Walking march Hamstring curl x20 BIL Hip abd/add x20 BIL Lateral walking with DB push downs Fwd walking with DB push downs Lateral "jogging" with db  Sitting on water seat: Bicycle kicks x1' Flutter kicks x1' Scissor kicks x1'  Pt requires the buoyancy of water for active assisted exercises with buoyancy supported for strengthening and AROM exercises. Hydrostatic pressure also supports joints by unweighting joint load by at least 50 % in 3-4 feet depth water. 80% in chest to neck deep water. Water will provide assistance with movement using the current and laminar flow while the buoyancy reduces weight bearing. Pt requires the viscosity of the water for resistance with strengthening exercises.   ASSESSMENT:   CLINICAL IMPRESSION: Session today focused on global strengthening and activity tolerance as well as gentle knee ROM in the aquatic environment for use of buoyancy to offload joints and the viscosity of water as resistance during therapeutic exercise.  Pt with high levels of pain entering water and throughout session with any knee movement, weighted or unweighted.  Pt does demonstrate significantly improved ability to ambulate with normal heel toe gait  pattern in water.  Patient was able to tolerate all prescribed exercises in the aquatic environment with no adverse effects and reports 9/10 pain at the end of the session. Patient continues to benefit from skilled PT services on land and aquatic based and should be progressed as able to improve functional independence.     OBJECTIVE IMPAIRMENTS: Abnormal gait, decreased activity tolerance, decreased balance, decreased endurance, decreased knowledge of condition, decreased knowledge of use of DME, decreased mobility, difficulty walking, decreased ROM, decreased strength, obesity, and pain.    ACTIVITY LIMITATIONS: carrying, lifting, standing, squatting, stairs, and transfers   PERSONAL FACTORS: Age, Fitness, Past/current experiences, and Time since onset of injury/illness/exacerbation are also affecting patient's functional outcome.    REHAB POTENTIAL: Fair based on chronicity, unsuccessful conservative interventions and advanced OA of B knees   CLINICAL DECISION MAKING: Stable/uncomplicated   EVALUATION COMPLEXITY: Low     GOALS: Goals reviewed with patient? No   SHORT TERM GOALS: Target date: 02/03/2022   Patient to demonstrate independence in HEP  Baseline:[email or web address] Goal status: INITIAL   2.  Initiate aquatic program Baseline: TBD Goal status: INITIAL       LONG TERM GOALS: Target date: 02/17/2022     Increase BLE strength to 4-/5 in deficit areas  Baseline:  MMT Right eval Left eval  Hip flexion 3 3  Hip extension      Hip abduction 3 3  Hip adduction      Hip internal rotation      Hip external rotation      Knee flexion 3+ 3+  Knee extension 3+ 3+  Ankle dorsiflexion      Ankle plantarflexion 3+ 3+    Goal status: INITIAL   2.  Increase FOTO score to 43 Baseline: 21 Goal status: INITIAL   3.  Increase B knee extension AROM to -15d B Baseline: -25d B Goal status: INITIAL       PLAN:   PT FREQUENCY: 1-2x/week   PT DURATION: 4 weeks   PLANNED  INTERVENTIONS: Therapeutic exercises, Therapeutic activity, Neuromuscular re-education, Balance training, Gait training, Patient/Family education, Self Care, Joint mobilization, Stair training, DME instructions, and Re-evaluation   PLAN FOR NEXT SESSION: HEP review and update, ROM, strengthening, gait training and functional activities   Kimberlee Nearing Juanisha Bautch PT 01/29/2022, 8:07 AM

## 2022-02-03 DIAGNOSIS — G5602 Carpal tunnel syndrome, left upper limb: Secondary | ICD-10-CM | POA: Diagnosis not present

## 2022-02-04 ENCOUNTER — Ambulatory Visit: Payer: 59 | Admitting: Physical Therapy

## 2022-02-04 NOTE — Therapy (Deleted)
OUTPATIENT PHYSICAL THERAPY TREATMENT NOTE   Patient Name: [name] MRN: [id number] DOB:May 21, 1965, 57 y.o., female Today's Date: 02/04/2022  PCP: Ezequiel Essex, MD    REFERRING PROVIDER: Gentry Fitz, MD      Past Medical History:  Diagnosis Date   ALLERGIC RHINITIS    takes Zyrtec daily   Anxiety    Arthritis    Asthma    Albuterol prn;Symbicort daily and Singulair daily   Bronchitis    hx of   CAP (community acquired pneumonia) 02/20/2015   Carpal tunnel syndrome    right   Chronic back pain    Constipation    Miralax prn   Depression    takes Cymbalta daily   Diabetes ([place])    type 2    Dizziness    pt states she gets off balance occasionally   Eczema    Gallstones 2010   GERD (gastroesophageal reflux disease)    takes Omeprazole daily   Headache(784.0)    couple of times a week   Hemorrhoids    Hypertension    takes Prinizide daily and Clonidine on Mondays   Hypoventilation associated with obesity syndrome (HCC)    Insomnia    Irritable bladder    Joint pain    Joint swelling    Lung disease    scleraderma   Morbid (severe) obesity due to excess calories ([place]) 04/10/2007   Restrictive changes on pfts 11/30/2007     Myasthenia gravis 1994   Positive Acetylcholine receptor Ab and single fiber EMG Dr. Barbaraann [facility] 1996- Dr. 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 VChesley Mires 10/30/2007 ANA positive, ANA titer  negative, RF less than 20   Rheumatoid arthritis(714.0)    Sarcoidosis 12/23/2010    Derm  dx NCG  12/03/10 (path report in EPIC)    Overview:  Overview:  Diagnosed on skin biopsy November 2012  Last Assessment & Plan:  Labs are unremarkable. CXR improved/stable. PFT wnl. I am happy patient had these tests and we have a baseline for her. At this time, I do not see an indication for starting chronic steroids daily. Patient agrees. Her skin lesions are bothering her the most and syst   Shortness of breath    can be sitting as well as exertion   Skin irritation    skin itchy   Trigger finger    Urinary frequency    takes Ditropan daily   Urinary urgency    Viral meningitis    history of viral meningitis   Past Surgical History:  Procedure Laterality Date   BRONCHOSCOPY  08/2007   CARPAL TUNNEL RELEASE  05/20/2011   Procedure: CARPAL TUNNEL RELEASE;  Surgeon: JNita Sells MD;  [place] [facility]  Service: Orthopedics;  Laterality: Right;   CARPAL TUNNEL RELEASE Right 11/22/2019   Procedure: RIGHT HAND CARPAL TUNNEL RELEASE;  Surgeon: CTania Ade MD;  Location: [facility];  Service: Orthopedics;  Laterality: Right;   CESAREAN SECTION  09/15/2004   COLONOSCOPY N/A 01/21/2014   Procedure: COLONOSCOPY;  Surgeon: Gatha Mayer, MD;  Location: [facility];  Service: Endoscopy;  Laterality: N/A;   cortisone injection     receives an injection every 45month   ESOPHAGOGASTRODUODENOSCOPY     ESOPHAGOGASTRODUODENOSCOPY N/A 01/21/2014   Procedure: ESOPHAGOGASTRODUODENOSCOPY (EGD);  Surgeon: CGatha Mayer MD;  [facility];  Service: Endoscopy;  Laterality: N/A;   LAPAROSCOPIC CHOLECYSTECTOMY  07/2008   by Dr. CNeldon Mc  Thymus resection  08/25/1993   TRIGGER FINGER RELEASE  05/20/2011   Procedure: RELEASE TRIGGER FINGER/A-1 PULLEY;  Surgeon: JNita Sells MD;  [place] [facility]  Service: Orthopedics;  Laterality: Right;  RIGHT TRIGGER THUMB RELEASE AND RIGHT CARPAL TUNNEL RELEASE   Patient Active Problem List   Diagnosis Date Noted    Drug-induced constipation 12/09/2021   Anxiety 09/23/2021   Chronic right shoulder pain 03/24/2021   Left wrist pain 03/24/2021   Diabetic neuropathy ([place] 02/25/2021   Healthcare maintenance 10/21/2020   Hyperlipidemia (primary prevention, LDL goal <70) 09/04/2020   Wheelchair dependence 05/05/2020   Carpal tunnel syndrome of right wrist, RECURRENT 10/15/2019   Insomnia, idiopathic 09/30/2018   Grade I diastolic dysfunction [id number]  Encounter for chronic pain management 02/11/2014   Diabetes ([place] 02/11/2014   Cystocele 09/14/2012   Chronic pain of both knees 05/28/2010   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   Allergic rhinitis 05/30/2008   Carpal tunnel syndrome of left wrist 01/17/2008   OBESITY HYPOVENTILATION SYNDROME 12/27/2007   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   Rationale for Evaluation and Treatment Rehabilitation  REFERRING DIAG: BILATERAL KNEE OSTEOARTHRITIS :HIP MUSCLE ; KNEE FLEXION   PERTINENT HISTORY:  HPI Patient is 57year old female comes in today with bilateral knee pain second opinion.  She has been told that she has arthritis in both knees.  [facility] told she needed to work on strengthening her legs and they have actually ordered therapy.  They also discussed with her that she needed to work on weight loss which she is doing.  She states she has had bilateral knee pain for some 30 years.  No known injury.  Radiographs in 2019 are available and show severe tricompartmental arthritis of the right knee.  She does take diclofenac Tylenol Lyrica and oxycodone for her knee pain but states this really  helped.  She is also tried injections both cortisone and gel injections without any real relief.  She notes that she has been basically dependent upon a motorized wheelchair for the last 2 years.  She does ambulate short distances though.  She states prior to the motorized wheelchair she was walking with a walker.  She is lost from 430 pounds down to 316 pounds.  States due to her myasthenia gravis she has been on prednisone.  Has gained a lot of weight over the years.  She has also had weakness in the extremities for years.  Reports that her diabetes is under good control at this point in time.  PRECAUTIONS/RESTRICTIONS:   FALL  SUBJECTIVE:  ***  PAIN:  Are you having pain? Yes: NPRS scale: 10/10 Pain location: B knees Pain description: ache Aggravating factors:  everything Relieving factors: nothing  OBJECTIVE: (objective measures completed at initial evaluation unless otherwise dated)  DIAGNOSTIC FINDINGS: CLINICAL DATA:  57 year old female with a history of chronic right knee pain   EXAM: RIGHT KNEE 3 VIEWS   COMPARISON:  08/03/2013   FINDINGS: No acute displaced fracture. Advanced joint space narrowing of the mediolateral compartment with marginal osteophyte formation, sclerotic changes. Degenerative changes of the patellofemoral joint. Changes have not progressed since the comparison of 08/03/2013   IMPRESSION: Advanced tricompartmental osteoarthritis, worse than the comparison study of 2015.   No acute bony abnormality identified.     Electronically Signed   By: Corrie Mckusick D.O.   On: 11/09/2017 09:34     PATIENT SURVEYS:  FOTO 21(43 predicted)    COGNITION: Overall cognitive status: Within functional limits for tasks assessed                         SENSATION: Not tested   MUSCLE LENGTH: UTA due to pain levels   POSTURE:  UTA due to poor standing tolerance   PALPATION: deferred   LOWER EXTREMITY ROM:   Active ROM Right eval Left eval  Hip  flexion      Hip extension      Hip abduction      Hip adduction      Hip internal rotation      Hip external rotation      Knee flexion 92d 100d  Knee extension -25d -25  Ankle dorsiflexion      Ankle plantarflexion      Ankle inversion      Ankle eversion       (Blank rows = not tested)   LOWER EXTREMITY MMT:   MMT Right eval Left eval  Hip flexion 3 3  Hip extension      Hip abduction 3 3  Hip adduction      Hip internal rotation      Hip external rotation      Knee flexion 3+ 3+  Knee extension 3+ 3+  Ankle dorsiflexion      Ankle plantarflexion 3+ 3+  Ankle inversion      Ankle eversion       (Blank rows = not tested)   LOWER EXTREMITY SPECIAL TESTS:  UTA due to pain and limited tolerance to testing positions    FUNCTIONAL TESTS:  30 seconds chair stand test 0 reps   GAIT: Distance walked: 54f Assistive device utilized: Wheelchair (power) Level of assistance: Complete Independence Comments: Patient using power chair for mobility     TODAY'S TREATMENT:                                                                                                                              DATE: 01/20/22      PATIENT EDUCATION:  Education details: Discussed eval findings, rehab rationale and POC and patient is in agreement  Person educated: Patient  Education method: Explanation Education comprehension: verbalized understanding and needs further education   HOME EXERCISE PROGRAM: Access Code: [email or web address] URL: [email or web address] Date: 01/20/2022 Prepared by: Sharlynn Oliphant   Exercises - Seated Long Arc Quad  - 5 x daily - 5 x weekly - 1 sets - 10 reps - Standing Heel Raise with Support  - 5 x daily - 5 x weekly - 1 sets - 10 reps    TREATMENT 02/04/21:  Aquatic therapy at [address] - therapeutic pool temp 92 degrees Pt enters building independently.  Treatment took place in water 3.8 to  4 ft 8 in.feet deep depending upon  activity.  Pt entered and exited the pool via stair and handrails    Aquatic Therapy:  Water walking for warm up  Stretching: Runners stretch on bottom step x30" BIL Hamstring stretch on bottom step x30" BIL  At edge of pool, pt performed LE exercise: Heel raises - 2x20 Walking march Hamstring curl x20 BIL Hip abd/add x20 BIL Lateral walking with DB push downs Fwd walking with DB push downs Lateral "jogging" with db  Sitting on water seat: Bicycle kicks x1' Flutter kicks x1' Scissor kicks x1'  Pt requires the buoyancy of water for active assisted exercises with buoyancy supported for strengthening and AROM exercises. Hydrostatic pressure also supports joints by unweighting joint load by at least 50 % in 3-4 feet depth water. 80% in chest to neck deep water. Water will provide assistance with movement using the current and laminar flow while the buoyancy reduces weight bearing. Pt requires the viscosity of the water for resistance with strengthening exercises.   ASSESSMENT:   CLINICAL IMPRESSION: Session today focused on global strengthening and activity tolerance as well as gentle knee ROM in the aquatic environment for use of buoyancy to offload joints and the viscosity of water as resistance during therapeutic exercise.  ***.  Patient was able to tolerate all prescribed exercises in the aquatic environment with no adverse effects and reports ***/10 pain at the end of the session. Patient continues to benefit from skilled PT services on land and aquatic based and should be progressed as able to improve functional independence.     OBJECTIVE IMPAIRMENTS: Abnormal gait, decreased activity tolerance, decreased balance, decreased endurance, decreased knowledge of condition, decreased knowledge of use of DME, decreased mobility, difficulty walking, decreased ROM, decreased strength, obesity, and pain.    ACTIVITY LIMITATIONS: carrying, lifting, standing, squatting, stairs, and  transfers   PERSONAL FACTORS: Age, Fitness, Past/current experiences, and Time since onset of injury/illness/exacerbation are also affecting patient's functional outcome.    REHAB POTENTIAL: Fair based on chronicity, unsuccessful conservative interventions and advanced OA of B knees   CLINICAL DECISION MAKING: Stable/uncomplicated   EVALUATION COMPLEXITY: Low     GOALS: Goals reviewed with patient? No   SHORT TERM GOALS: Target date: 02/03/2022   Patient to demonstrate independence in HEP  Baseline:[email or web address] Goal status: INITIAL   2.  Initiate aquatic program Baseline: TBD Goal status: INITIAL       LONG TERM GOALS: Target date: 02/17/2022     Increase BLE strength to 4-/5 in deficit areas Baseline:  MMT Right eval Left eval  Hip flexion 3 3  Hip extension      Hip abduction 3 3  Hip adduction      Hip internal rotation      Hip external rotation      Knee flexion 3+ 3+  Knee extension 3+ 3+  Ankle dorsiflexion  Ankle plantarflexion 3+ 3+    Goal status: INITIAL   2.  Increase FOTO score to 43 Baseline: 21 Goal status: INITIAL   3.  Increase B knee extension AROM to -15d B Baseline: -25d B Goal status: INITIAL       PLAN:   PT FREQUENCY: 1-2x/week   PT DURATION: 4 weeks   PLANNED INTERVENTIONS: Therapeutic exercises, Therapeutic activity, Neuromuscular re-education, Balance training, Gait training, Patient/Family education, Self Care, Joint mobilization, Stair training, DME instructions, and Re-evaluation   PLAN FOR NEXT SESSION: HEP review and update, ROM, strengthening, gait training and functional activities   Kevan Ny Aleksey Newbern PT 02/04/2022, 2:00 PM

## 2022-02-08 ENCOUNTER — Other Ambulatory Visit: Payer: Self-pay | Admitting: Orthopedic Surgery

## 2022-02-09 NOTE — Therapy (Signed)
OUTPATIENT PHYSICAL THERAPY TREATMENT NOTE   Patient Name: [name] MRN: [id number] DOB:13-May-1965, 57 y.o., female Today's Date: 02/10/2022  PCP: Fayette Pho, MD    REFERRING PROVIDER: Otho Darner, MD    PT End of Session - 02/10/22 1557     Visit Number 3    Number of Visits 12    Date for PT Re-Evaluation 03/18/22    Authorization Type [employer]    PT Start Time 1555    PT Stop Time 1630    PT Time Calculation (min) 35 min    Activity Tolerance Patient limited by pain    Behavior During Therapy [facility] for tasks assessed/performed              Past Medical History:  Diagnosis Date   ALLERGIC RHINITIS    takes Zyrtec daily   Anxiety    Arthritis    Asthma    Albuterol prn;Symbicort daily and Singulair daily   Bronchitis    hx of   CAP (community acquired pneumonia) 02/20/2015   Carpal tunnel syndrome    right   Chronic back pain    Constipation    Miralax prn   Depression    takes Cymbalta daily   Diabetes (HCC)    type 2    Dizziness    pt states she gets off balance occasionally   Eczema    Gallstones 2010   GERD (gastroesophageal reflux disease)    takes Omeprazole daily   Headache(784.0)    couple of times a week   Hemorrhoids    Hypertension    takes Prinizide daily and Clonidine on Mondays   Hypoventilation associated with obesity syndrome (HCC)    Insomnia    Irritable bladder    Joint pain    Joint swelling    Lung disease    scleraderma   Morbid (severe) obesity due to excess calories (HCC) 04/10/2007   Restrictive changes on pfts 11/30/2007     Myasthenia gravis 1994   Positive Acetylcholine receptor Ab and single fiber EMG Dr. Clarisa [facility] 1996- Dr. 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 Coralyn Helling- 10/30/2007 ANA positive, ANA titer  negative, RF less than 20   Rheumatoid arthritis(714.0)    Sarcoidosis 12/23/2010   Derm  dx NCG  12/03/10 (path report in EPIC)    Overview:  Overview:  Diagnosed on skin biopsy November 2012  Last Assessment & Plan:  Labs are unremarkable. CXR improved/stable. PFT wnl. I am happy patient had these tests and we have a baseline for her. At this time, I do not see an indication for starting chronic steroids daily. Patient agrees. Her skin lesions are bothering her the most and syst   Shortness of breath    can be sitting as well as exertion   Skin irritation    skin itchy   Trigger finger    Urinary frequency    takes Ditropan daily   Urinary urgency  Viral meningitis    history of viral meningitis   Past Surgical History:  Procedure Laterality Date   BRONCHOSCOPY  08/2007   CARPAL TUNNEL RELEASE  05/20/2011   Procedure: CARPAL TUNNEL RELEASE;  Surgeon: Nita Sells, MD;  Location: [facility];  Service: Orthopedics;  Laterality: Right;   CARPAL TUNNEL RELEASE Right 11/22/2019   Procedure: RIGHT HAND CARPAL TUNNEL RELEASE;  Surgeon: Tania Ade, MD;  Location: [facility];  Service: Orthopedics;  Laterality: Right;   CESAREAN SECTION  09/15/2004   COLONOSCOPY N/A 01/21/2014   Procedure: COLONOSCOPY;  Surgeon: Gatha Mayer, MD;  Location: [facility];  Service: Endoscopy;  Laterality: N/A;   cortisone injection     receives an injection every 18months   ESOPHAGOGASTRODUODENOSCOPY     ESOPHAGOGASTRODUODENOSCOPY N/A 01/21/2014   Procedure: ESOPHAGOGASTRODUODENOSCOPY (EGD);  Surgeon: Gatha Mayer, MD;  Location: [facility];  Service: Endoscopy;  Laterality: N/A;   LAPAROSCOPIC CHOLECYSTECTOMY  07/2008   by Dr. Neldon Mc   Thymus resection  08/25/1993   TRIGGER FINGER RELEASE  05/20/2011   Procedure: RELEASE TRIGGER FINGER/A-1 PULLEY;  Surgeon: Nita Sells, MD;  Location: [facility];  Service: Orthopedics;  Laterality: Right;  RIGHT TRIGGER THUMB RELEASE AND RIGHT CARPAL TUNNEL RELEASE   Patient Active Problem List   Diagnosis Date Noted   Drug-induced constipation 12/09/2021   Anxiety 09/23/2021   Chronic right shoulder pain 03/24/2021   Left wrist pain 03/24/2021   Diabetic neuropathy ([place]) 02/25/2021   Healthcare maintenance 10/21/2020   Hyperlipidemia (primary prevention, LDL goal <70) 09/04/2020   Wheelchair dependence 05/05/2020   Carpal tunnel syndrome of right wrist, RECURRENT 10/15/2019   Insomnia, idiopathic 09/30/2018   Grade I diastolic dysfunction 10/62/6948   Encounter for chronic pain management 02/11/2014   Diabetes ([place]) 02/11/2014   Cystocele 09/14/2012   Chronic pain of both knees 05/28/2010   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   Allergic rhinitis 05/30/2008   Carpal tunnel syndrome of left wrist 01/17/2008   OBESITY HYPOVENTILATION SYNDROME 12/27/2007   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/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   Obstructive sleep apnea 10/30/2007   BMI 50.0-59.9, adult ([place]) 04/10/2007   MYASTHENIA 03/07/2007   Depression, recurrent ([place]) 03/24/2006   Essential (primary) hypertension 03/24/2006   Gastro-esophageal reflux disease without esophagitis 03/24/2006    THERAPY DIAG:  Chronic pain of left knee  Chronic pain of right knee  Muscle weakness (generalized)   Rationale for Evaluation and Treatment Rehabilitation  REFERRING DIAG: BILATERAL KNEE OSTEOARTHRITIS :HIP MUSCLE ; KNEE FLEXION   PERTINENT HISTORY:  HPI Patient is 57 year old female comes in today with bilateral knee pain second opinion.  She has been told that she has arthritis in both knees.  She was seen at [place] recently and told she needed to work on  strengthening her legs and they have actually ordered therapy.  They also discussed with her that she needed to work on weight loss which she is doing.  She states she has had bilateral knee pain for some 30 years.  No known injury.  Radiographs in 2019 are available and show severe tricompartmental arthritis of the right knee.  She does take diclofenac Tylenol Lyrica and oxycodone for her knee pain but states this really helped.  She is also tried injections both cortisone and gel injections without any real relief.  She notes that she has been basically dependent upon a motorized wheelchair for the last 2 years.  She does ambulate short distances though.  She  states prior to the motorized wheelchair she was walking with a walker.  She is lost from 430 pounds down to 316 pounds.  States due to her myasthenia gravis she has been on prednisone.  Has gained a lot of weight over the years.  She has also had weakness in the extremities for years.  Reports that her diabetes is under good control at this point in time.  PRECAUTIONS/RESTRICTIONS:   FALL  SUBJECTIVE:  Pt reports 02-10-22  8/10 pain in Bil knees   PAIN:  Are you having pain? Yes: NPRS scale: 10/10 Pain location: B knees Pain description: ache Aggravating factors: everything Relieving factors: nothing  OBJECTIVE: (objective measures completed at initial evaluation unless otherwise dated)  DIAGNOSTIC FINDINGS: CLINICAL DATA:  57 year old female with a history of chronic right knee pain   EXAM: RIGHT KNEE 3 VIEWS   COMPARISON:  08/03/2013   FINDINGS: No acute displaced fracture. Advanced joint space narrowing of the mediolateral compartment with marginal osteophyte formation, sclerotic changes. Degenerative changes of the patellofemoral joint. Changes have not progressed since the comparison of 08/03/2013   IMPRESSION: Advanced tricompartmental osteoarthritis, worse than the comparison study of 2015.   No acute bony  abnormality identified.     Electronically Signed   By: Gilmer Mor D.O.   On: 11/09/2017 09:34     PATIENT SURVEYS:  FOTO 21(43 predicted)    COGNITION: Overall cognitive status: Within functional limits for tasks assessed                         SENSATION: Not tested   MUSCLE LENGTH: UTA due to pain levels   POSTURE:  UTA due to poor standing tolerance   PALPATION: deferred   LOWER EXTREMITY ROM:   Active ROM Right eval Left eval  Hip flexion      Hip extension      Hip abduction      Hip adduction      Hip internal rotation      Hip external rotation      Knee flexion 92d 100d  Knee extension -25d -25  Ankle dorsiflexion      Ankle plantarflexion      Ankle inversion      Ankle eversion       (Blank rows = not tested)   LOWER EXTREMITY MMT:   MMT Right eval Left eval  Hip flexion 3 3  Hip extension      Hip abduction 3 3  Hip adduction      Hip internal rotation      Hip external rotation      Knee flexion 3+ 3+  Knee extension 3+ 3+  Ankle dorsiflexion      Ankle plantarflexion 3+ 3+  Ankle inversion      Ankle eversion       (Blank rows = not tested)   LOWER EXTREMITY SPECIAL TESTS:  UTA due to pain and limited tolerance to testing positions    FUNCTIONAL TESTS:  30 seconds chair stand test 0 reps   GAIT: Distance walked: 37ft Assistive device utilized: Wheelchair (power) Level of assistance: Complete Independence Comments: Patient using power chair for mobility     TODAY'S TREATMENT:  DATE: 01/20/22       PATIENT EDUCATION:  Education details: Discussed eval findings, rehab rationale and POC and patient is in agreement  Person educated: Patient Education method: Explanation Education comprehension: verbalized understanding and needs further education   HOME EXERCISE PROGRAM: Access Code:  [email or web address] URL: [email or web address] Date: 01/20/2022 Prepared by: Sharlynn Oliphant   Exercises - Seated Long Arc Quad  - 5 x daily - 5 x weekly - 1 sets - 10 reps - Standing Heel Raise with Support  - 5 x daily - 5 x weekly - 1 sets - 10 reps  [facility] Adult PT Treatment:                                                DATE: 02-10-22  Aquatic therapy at [address] - therapeutic pool temp 87 degrees in lap pool Pt enters building via W/C and son accompanying. Treatment took place in water 3.8 to  4 ft 8 in.feet deep depending upon activity.  Pt entered and exited the pool via stair and handrails   Pain at initiation of RX  8/10 bil knees.  At end  RT 5/10 and LT 4/10 Pt  was educated on  beneficial therapeutic effects of water while ambulating to acclimate to water walking forward, backward and side stepping.  Pt educated on neutral posture and hip hinging in seated position with water at chest level x 10 with stretch to low back and then x 10 with back at pool wall at external cue, VC for neck tucked to prevent hyperextension. Aquatic Therapy:  Water walking for warm up forwards, and backwards and side stepping   At edge of pool, pt performed LE exercise: Heel raises - 2x20 Walking march Hamstring curl x20 BIL  Hip circles CW/CCW x10 BIL   Hip abd/add x20 BIL Lateral walking with DB push downs Fwd walking with DB push downs Lateral "jogging" with DB  Aquastretch for BiILquadriceps and medial knees BIL  Pt requires the buoyancy of water for active assisted exercises with buoyancy supported for strengthening and AROM exercises. Hydrostatic pressure also supports joints by unweighting joint load by at least 50 % in 3-4 feet depth water. 80% in chest to neck deep water. Water will provide assistance with movement using the current and laminar flow while the buoyancy reduces weight bearing. Pt requires the viscosity of the water for resistance with strengthening  exercises.     TREATMENT 01/28/21:  Aquatic therapy at [address] - therapeutic pool temp 92 degrees Pt enters building independently.  Treatment took place in water 3.8 to  4 ft 8 in.feet deep depending upon activity.  Pt entered and exited the pool via stair and handrails    Aquatic Therapy:  Water walking for warm up  Stretching: Runners stretch on bottom step x30" BIL Hamstring stretch on bottom step x30" BIL  At edge of pool, pt performed LE exercise: Heel raises - 2x20 Walking march Hamstring curl x20 BIL Hip abd/add x20 BIL Lateral walking with DB push downs Fwd walking with DB push downs Lateral "jogging" with db  Sitting on water seat: Bicycle kicks x1' Flutter kicks x1' Scissor kicks x1'  Pt requires the buoyancy of water for active assisted exercises with buoyancy supported for strengthening and AROM exercises. Hydrostatic pressure also supports joints by unweighting joint load by  at least 50 % in 3-4 feet depth water. 80% in chest to neck deep water. Water will provide assistance with movement using the current and laminar flow while the buoyancy reduces weight bearing. Pt requires the viscosity of the water for resistance with strengthening exercises.   ASSESSMENT:   CLINICAL IMPRESSION: Pt entered aquatic area 15 min late but was willing to participate in 65 degree lap pool today. Pt with 8/10 BIL knee pain and participated without rest breaks for 25 min with exercise focused on strengthening/endurance and pain relief with increased knee ROM  in the aquatic environment for use of buoyancy to offload joints and the viscosity of water as resistance during therapeutic exercise. Pt consented to Aqua stretch for BIL knees with improvement in pain from 8/10 to 5/10 RT and 4/10 LT knee.  Pt has participated in aqua exercise before and enjoys benefits. Patient was able to tolerate all prescribed exercises in the aquatic environment with no adverse  effects.  Patient continues to benefit from skilled PT services on land and aquatic based and should be progressed as able to improve functional independence.      OBJECTIVE IMPAIRMENTS: Abnormal gait, decreased activity tolerance, decreased balance, decreased endurance, decreased knowledge of condition, decreased knowledge of use of DME, decreased mobility, difficulty walking, decreased ROM, decreased strength, obesity, and pain.    ACTIVITY LIMITATIONS: carrying, lifting, standing, squatting, stairs, and transfers   PERSONAL FACTORS: Age, Fitness, Past/current experiences, and Time since onset of injury/illness/exacerbation are also affecting patient's functional outcome.    REHAB POTENTIAL: Fair based on chronicity, unsuccessful conservative interventions and advanced OA of B knees   CLINICAL DECISION MAKING: Stable/uncomplicated   EVALUATION COMPLEXITY: Low     GOALS: Goals reviewed with patient? No   SHORT TERM GOALS: Target date: 02/03/2022   Patient to demonstrate independence in HEP  Baseline:[email or web address] Goal status: INITIAL   2.  Initiate aquatic program Baseline: TBD Goal status: INITIAL       LONG TERM GOALS: Target date: 02/17/2022     Increase BLE strength to 4-/5 in deficit areas Baseline:  MMT Right eval Left eval  Hip flexion 3 3  Hip extension      Hip abduction 3 3  Hip adduction      Hip internal rotation      Hip external rotation      Knee flexion 3+ 3+  Knee extension 3+ 3+  Ankle dorsiflexion      Ankle plantarflexion 3+ 3+    Goal status: INITIAL   2.  Increase FOTO score to 43 Baseline: 21 Goal status: INITIAL   3.  Increase B knee extension AROM to -15d B Baseline: -25d B Goal status: INITIAL       PLAN:   PT FREQUENCY: 1-2x/week   PT DURATION: 4 weeks   PLANNED INTERVENTIONS: Therapeutic exercises, Therapeutic activity, Neuromuscular re-education, Balance training, Gait training, Patient/Family education, Self Care, Joint  mobilization, Stair training, DME instructions, and Re-evaluation   PLAN FOR NEXT SESSION: HEP review and update, ROM, strengthening, gait training and functional activities  Garen Lah, PT, [facility] Certified Exercise Expert for the Aging Adult  02/10/22 5:02 PM Phone: [phone] Fax: [phone]

## 2022-02-10 ENCOUNTER — Ambulatory Visit: Payer: 59 | Admitting: Physical Therapy

## 2022-02-10 ENCOUNTER — Encounter: Payer: Self-pay | Admitting: Physical Therapy

## 2022-02-10 DIAGNOSIS — M25562 Pain in left knee: Secondary | ICD-10-CM | POA: Diagnosis not present

## 2022-02-10 DIAGNOSIS — G8929 Other chronic pain: Secondary | ICD-10-CM

## 2022-02-10 DIAGNOSIS — M25561 Pain in right knee: Secondary | ICD-10-CM | POA: Diagnosis not present

## 2022-02-10 DIAGNOSIS — M6281 Muscle weakness (generalized): Secondary | ICD-10-CM | POA: Diagnosis not present

## 2022-02-11 NOTE — Therapy (Signed)
OUTPATIENT PHYSICAL THERAPY TREATMENT NOTE   Patient Name: [name] MRN: [id number] DOB:Sep 11, 1965, 57 y.o., female Today's Date: 02/12/2022  PCP: Fayette Pho, MD    REFERRING PROVIDER: Otho Darner, MD    PT End of Session - 02/12/22 1602     Visit Number 4    Number of Visits 12    Date for PT Re-Evaluation 03/18/22    Authorization Type [employer]    PT Start Time 1600    PT Stop Time 1645    PT Time Calculation (min) 45 min    Activity Tolerance Patient limited by pain;Patient tolerated treatment well    Behavior During Therapy [facility] for tasks assessed/performed               Past Medical History:  Diagnosis Date   ALLERGIC RHINITIS    takes Zyrtec daily   Anxiety    Arthritis    Asthma    Albuterol prn;Symbicort daily and Singulair daily   Bronchitis    hx of   CAP (community acquired pneumonia) 02/20/2015   Carpal tunnel syndrome    right   Chronic back pain    Constipation    Miralax prn   Depression    takes Cymbalta daily   Diabetes (HCC)    type 2    Dizziness    pt states she gets off balance occasionally   Eczema    Gallstones 2010   GERD (gastroesophageal reflux disease)    takes Omeprazole daily   Headache(784.0)    couple of times a week   Hemorrhoids    Hypertension    takes Prinizide daily and Clonidine on Mondays   Hypoventilation associated with obesity syndrome (HCC)    Insomnia    Irritable bladder    Joint pain    Joint swelling    Lung disease    scleraderma   Morbid (severe) obesity due to excess calories (HCC) 04/10/2007   Restrictive changes on pfts 11/30/2007     Myasthenia gravis 1994   Positive Acetylcholine receptor Ab and single fiber EMG Dr. Clarisa [facility] 1996- Dr. 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 Coralyn Helling- 10/30/2007 ANA positive, ANA titer  negative, RF less than 20   Rheumatoid arthritis(714.0)    Sarcoidosis 12/23/2010   Derm  dx NCG  12/03/10 (path report in EPIC)    Overview:  Overview:  Diagnosed on skin biopsy November 2012  Last Assessment & Plan:  Labs are unremarkable. CXR improved/stable. PFT wnl. I am happy patient had these tests and we have a baseline for her. At this time, I do not see an indication for starting chronic steroids daily. Patient agrees. Her skin lesions are bothering her the most and syst   Shortness of breath    can be sitting as well as exertion   Skin irritation    skin itchy   Trigger finger    Urinary frequency    takes Ditropan daily  Urinary urgency    Viral meningitis    history of viral meningitis   Past Surgical History:  Procedure Laterality Date   BRONCHOSCOPY  08/2007   CARPAL TUNNEL RELEASE  05/20/2011   Procedure: CARPAL TUNNEL RELEASE;  Surgeon: Nita Sells, MD;  Location: [place];  Service: Orthopedics;  Laterality: Right;   CARPAL TUNNEL RELEASE Right 11/22/2019   Procedure: RIGHT HAND CARPAL TUNNEL RELEASE;  Surgeon: Tania Ade, MD;  Location: [facility];  Service: Orthopedics;  Laterality: Right;   CESAREAN SECTION  09/15/2004   COLONOSCOPY N/A 01/21/2014   Procedure: COLONOSCOPY;  Surgeon: Gatha Mayer, MD;  Location: [facility];  Service: Endoscopy;  Laterality: N/A;   cortisone injection     receives an injection every 24months   ESOPHAGOGASTRODUODENOSCOPY     ESOPHAGOGASTRODUODENOSCOPY N/A 01/21/2014   Procedure: ESOPHAGOGASTRODUODENOSCOPY (EGD);  Surgeon: Gatha Mayer, MD;  Location: [facility];  Service: Endoscopy;  Laterality: N/A;   LAPAROSCOPIC  CHOLECYSTECTOMY  07/2008   by Dr. Neldon Mc   Thymus resection  08/25/1993   TRIGGER FINGER RELEASE  05/20/2011   Procedure: RELEASE TRIGGER FINGER/A-1 PULLEY;  Surgeon: Nita Sells, MD;  Location: [facility];  Service: Orthopedics;  Laterality: Right;  RIGHT TRIGGER THUMB RELEASE AND RIGHT CARPAL TUNNEL RELEASE   Patient Active Problem List   Diagnosis Date Noted   Drug-induced constipation 12/09/2021   Anxiety 09/23/2021   Chronic right shoulder pain 03/24/2021   Left wrist pain 03/24/2021   Diabetic neuropathy ([place]) 02/25/2021   Healthcare maintenance 10/21/2020   Hyperlipidemia (primary prevention, LDL goal <70) 09/04/2020   Wheelchair dependence 05/05/2020   Carpal tunnel syndrome of right wrist, RECURRENT 10/15/2019   Insomnia, idiopathic 09/30/2018   Grade I diastolic dysfunction 29/51/8841   Encounter for chronic pain management 02/11/2014   Diabetes ([place]) 02/11/2014   Cystocele 09/14/2012   Chronic pain of both knees 05/28/2010   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   Allergic rhinitis 05/30/2008   Carpal tunnel syndrome of left wrist 01/17/2008   OBESITY HYPOVENTILATION SYNDROME 12/27/2007   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/01/2007   Obstructive sleep apnea 10/30/2007   BMI 50.0-59.9, adult ([place]) 04/10/2007   MYASTHENIA 03/07/2007   Depression, recurrent ([place]) 03/24/2006   Essential (primary) hypertension 03/24/2006   Gastro-esophageal reflux disease without esophagitis 03/24/2006    THERAPY DIAG:  Chronic pain of left knee  Chronic pain of right knee  Muscle weakness (generalized)   Rationale for Evaluation and Treatment Rehabilitation  REFERRING DIAG: BILATERAL KNEE OSTEOARTHRITIS :HIP MUSCLE ; KNEE FLEXION   PERTINENT HISTORY:  HPI Patient is 57 year old female comes in today with bilateral knee pain second opinion.  She has been told that she has arthritis in both knees.  She was seen at [facility]  recently and told she needed to work on strengthening her legs and they have actually ordered therapy.  They also discussed with her that she needed to work on weight loss which she is doing.  She states she has had bilateral knee pain for some 30 years.  No known injury.  Radiographs in 2019 are available and show severe tricompartmental arthritis of the right knee.  She does take diclofenac Tylenol Lyrica and oxycodone for her knee pain but states this really helped.  She is also tried injections both cortisone and gel injections without any real relief.  She notes that she has been basically dependent upon a motorized wheelchair for the last 2 years.  She does ambulate  short distances though.  She states prior to the motorized wheelchair she was walking with a walker.  She is lost from 430 pounds down to 316 pounds.  States due to her myasthenia gravis she has been on prednisone.  Has gained a lot of weight over the years.  She has also had weakness in the extremities for years.  Reports that her diabetes is under good control at this point in time.  PRECAUTIONS/RESTRICTIONS:   FALL  SUBJECTIVE:  Patient reports continued BIL knee pain, states they aren't hurting too bad today. R>L.  PAIN:  Are you having pain? Yes: NPRS scale: 6-7/10 Pain location: B knees Pain description: ache Aggravating factors: everything Relieving factors: nothing  OBJECTIVE: (objective measures completed at initial evaluation unless otherwise dated)  DIAGNOSTIC FINDINGS: CLINICAL DATA:  57 year old female with a history of chronic right knee pain   EXAM: RIGHT KNEE 3 VIEWS   COMPARISON:  08/03/2013   FINDINGS: No acute displaced fracture. Advanced joint space narrowing of the mediolateral compartment with marginal osteophyte formation, sclerotic changes. Degenerative changes of the patellofemoral joint. Changes have not progressed since the comparison of 08/03/2013   IMPRESSION: Advanced tricompartmental  osteoarthritis, worse than the comparison study of 2015.   No acute bony abnormality identified.     Electronically Signed   By: Gilmer Mor D.O.   On: 11/09/2017 09:34     PATIENT SURVEYS:  FOTO 21(43 predicted)    COGNITION: Overall cognitive status: Within functional limits for tasks assessed                         SENSATION: Not tested   MUSCLE LENGTH: UTA due to pain levels   POSTURE:  UTA due to poor standing tolerance   PALPATION: deferred   LOWER EXTREMITY ROM:   Active ROM Right eval Left eval  Hip flexion      Hip extension      Hip abduction      Hip adduction      Hip internal rotation      Hip external rotation      Knee flexion 92d 100d  Knee extension -25d -25  Ankle dorsiflexion      Ankle plantarflexion      Ankle inversion      Ankle eversion       (Blank rows = not tested)   LOWER EXTREMITY MMT:   MMT Right eval Left eval  Hip flexion 3 3  Hip extension      Hip abduction 3 3  Hip adduction      Hip internal rotation      Hip external rotation      Knee flexion 3+ 3+  Knee extension 3+ 3+  Ankle dorsiflexion      Ankle plantarflexion 3+ 3+  Ankle inversion      Ankle eversion       (Blank rows = not tested)   LOWER EXTREMITY SPECIAL TESTS:  UTA due to pain and limited tolerance to testing positions    FUNCTIONAL TESTS:  30 seconds chair stand test 0 reps   GAIT: Distance walked: 37ft Assistive device utilized: Wheelchair (power) Level of assistance: Complete Independence Comments: Patient using power chair for mobility      PATIENT EDUCATION:  Education details: Discussed eval findings, rehab rationale and POC and patient is in agreement  Person educated: Patient Education method: Explanation Education comprehension: verbalized understanding and needs further education   HOME EXERCISE PROGRAM:  Access Code: [email or web address] URL: [email or web address] Date: 01/20/2022 Prepared by: Sharlynn Oliphant    Exercises - Seated Long Arc Quad  - 5 x daily - 5 x weekly - 1 sets - 10 reps - Standing Heel Raise with Support  - 5 x daily - 5 x weekly - 1 sets - 10 reps  [facility] Adult PT Treatment:                                                DATE: 02/12/2022 Aquatic therapy at [address] - therapeutic pool temp approximately 92 degrees. Pt enters building via W/C and son accompanying. Treatment took place in water 3.8 to  4 ft 8 in.feet deep depending upon activity.  Pt entered and exited the pool via stair and handrails with step to pattern.  Aquatic Therapy:  Water walking for warm up forwards, and backwards and side stepping holding blue DB Therapeutic Exercise: Step ups on submerged step x10 fwd/lat BIL Walking march Lateral walking with DB push downs Fwd walking with DB push downs At edge of pool, pt performed LE exercise: Heel raises - 2x20 Hamstring curl x20 BIL  Hip circles CW/CCW x10 BIL   Hip abd/add x20 BIL   Pt requires the buoyancy of water for active assisted exercises with buoyancy supported for strengthening and AROM exercises. Hydrostatic pressure also supports joints by unweighting joint load by at least 50 % in 3-4 feet depth water. 80% in chest to neck deep water. Water will provide assistance with movement using the current and laminar flow while the buoyancy reduces weight bearing. Pt requires the viscosity of the water for resistance with strengthening exercises.   [facility] Adult PT Treatment:                                                DATE: 02-10-22  Aquatic therapy at [address] - therapeutic pool temp 87 degrees in lap pool Pt enters building via W/C and son accompanying. Treatment took place in water 3.8 to  4 ft 8 in.feet deep depending upon activity.  Pt entered and exited the pool via stair and handrails   Pain at initiation of RX  8/10 bil knees.  At end  RT 5/10 and LT 4/10 Pt  was educated on  beneficial therapeutic effects  of water while ambulating to acclimate to water walking forward, backward and side stepping.  Pt educated on neutral posture and hip hinging in seated position with water at chest level x 10 with stretch to low back and then x 10 with back at pool wall at external cue, VC for neck tucked to prevent hyperextension. Aquatic Therapy:  Water walking for warm up forwards, and backwards and side stepping   At edge of pool, pt performed LE exercise: Heel raises - 2x20 Walking march Hamstring curl x20 BIL  Hip circles CW/CCW x10 BIL   Hip abd/add x20 BIL Lateral walking with DB push downs Fwd walking with DB push downs Lateral "jogging" with DB  Aquastretch for BiILquadriceps and medial knees BIL  Pt requires the buoyancy of water for active assisted exercises with buoyancy supported for strengthening and AROM exercises. Hydrostatic pressure also supports joints by unweighting joint  load by at least 50 % in 3-4 feet depth water. 80% in chest to neck deep water. Water will provide assistance with movement using the current and laminar flow while the buoyancy reduces weight bearing. Pt requires the viscosity of the water for resistance with strengthening exercises.    ASSESSMENT:   CLINICAL IMPRESSION: Patient presents to aquatic PT session reporting improved BIL knee pain today at 7/10 with R>L. She reports non-compliance with HEP over the past week. Session today focused on BIL LE strengthening and general conditioning in the aquatic environment for use of buoyancy to offload joints and the viscosity of water as resistance during therapeutic exercise. Patient was able to tolerate all prescribed exercises in the aquatic environment with no adverse effects. Patient continues to benefit from skilled PT services on land and aquatic based and should be progressed as able to improve functional independence.    OBJECTIVE IMPAIRMENTS: Abnormal gait, decreased activity tolerance, decreased balance,  decreased endurance, decreased knowledge of condition, decreased knowledge of use of DME, decreased mobility, difficulty walking, decreased ROM, decreased strength, obesity, and pain.    ACTIVITY LIMITATIONS: carrying, lifting, standing, squatting, stairs, and transfers   PERSONAL FACTORS: Age, Fitness, Past/current experiences, and Time since onset of injury/illness/exacerbation are also affecting patient's functional outcome.    REHAB POTENTIAL: Fair based on chronicity, unsuccessful conservative interventions and advanced OA of B knees   CLINICAL DECISION MAKING: Stable/uncomplicated   EVALUATION COMPLEXITY: Low     GOALS: Goals reviewed with patient? No   SHORT TERM GOALS: Target date: 02/03/2022   Patient to demonstrate independence in HEP  Baseline:[email or web address] Goal status: Ongoing Pt reports non-compliance 02/12/22   2.  Initiate aquatic program Baseline: TBD Goal status: Met Initiated      LONG TERM GOALS: Target date: 02/17/2022     Increase BLE strength to 4-/5 in deficit areas Baseline:  MMT Right eval Left eval  Hip flexion 3 3  Hip extension      Hip abduction 3 3  Hip adduction      Hip internal rotation      Hip external rotation      Knee flexion 3+ 3+  Knee extension 3+ 3+  Ankle dorsiflexion      Ankle plantarflexion 3+ 3+    Goal status: INITIAL   2.  Increase FOTO score to 43 Baseline: 21 Goal status: INITIAL   3.  Increase B knee extension AROM to -15d B Baseline: -25d B Goal status: INITIAL       PLAN:   PT FREQUENCY: 1-2x/week   PT DURATION: 4 weeks   PLANNED INTERVENTIONS: Therapeutic exercises, Therapeutic activity, Neuromuscular re-education, Balance training, Gait training, Patient/Family education, Self Care, Joint mobilization, Stair training, DME instructions, and Re-evaluation   PLAN FOR NEXT SESSION: HEP review and update, ROM, strengthening, gait training and functional activities  Berta Minor, PTA 02/12/22 4:45  PM

## 2022-02-12 ENCOUNTER — Ambulatory Visit: Payer: 59

## 2022-02-12 DIAGNOSIS — M6281 Muscle weakness (generalized): Secondary | ICD-10-CM | POA: Diagnosis not present

## 2022-02-12 DIAGNOSIS — G8929 Other chronic pain: Secondary | ICD-10-CM

## 2022-02-12 DIAGNOSIS — M25562 Pain in left knee: Secondary | ICD-10-CM | POA: Diagnosis not present

## 2022-02-12 DIAGNOSIS — M25561 Pain in right knee: Secondary | ICD-10-CM | POA: Diagnosis not present

## 2022-02-14 ENCOUNTER — Other Ambulatory Visit: Payer: Self-pay | Admitting: Family Medicine

## 2022-02-14 DIAGNOSIS — M174 Other bilateral secondary osteoarthritis of knee: Secondary | ICD-10-CM

## 2022-02-15 ENCOUNTER — Other Ambulatory Visit (HOSPITAL_COMMUNITY): Payer: Self-pay

## 2022-02-15 MED ORDER — OXYCODONE HCL 5 MG PO TABS
ORAL_TABLET | ORAL | 0 refills | Status: DC
  Filled 2022-02-15: qty 30, 8d supply, fill #0

## 2022-02-15 MED ORDER — PREGABALIN 75 MG PO CAPS
75.0000 mg | ORAL_CAPSULE | Freq: Two times a day (BID) | ORAL | 2 refills | Status: DC
  Filled 2022-02-15 – 2022-03-19 (×2): qty 60, 30d supply, fill #0
  Filled 2022-04-17: qty 60, 30d supply, fill #1
  Filled 2022-05-24: qty 60, 30d supply, fill #2

## 2022-02-15 MED ORDER — DICLOFENAC SODIUM 50 MG PO TBEC
50.0000 mg | DELAYED_RELEASE_TABLET | Freq: Two times a day (BID) | ORAL | 0 refills | Status: DC | PRN
  Filled 2022-02-15: qty 60, 30d supply, fill #0

## 2022-02-15 MED ORDER — MOUNJARO 7.5 MG/0.5ML ~~LOC~~ SOAJ
7.5000 mg | SUBCUTANEOUS | 1 refills | Status: DC
  Filled 2022-02-15: qty 2, 28d supply, fill #0
  Filled 2022-03-19: qty 2, 28d supply, fill #1

## 2022-02-16 ENCOUNTER — Other Ambulatory Visit (HOSPITAL_COMMUNITY): Payer: Self-pay

## 2022-02-17 ENCOUNTER — Ambulatory Visit: Payer: 59 | Admitting: Physical Therapy

## 2022-02-17 ENCOUNTER — Encounter: Payer: Self-pay | Admitting: Physical Therapy

## 2022-02-17 ENCOUNTER — Encounter (HOSPITAL_COMMUNITY): Payer: Self-pay

## 2022-02-17 DIAGNOSIS — G8929 Other chronic pain: Secondary | ICD-10-CM | POA: Diagnosis not present

## 2022-02-17 DIAGNOSIS — M25561 Pain in right knee: Secondary | ICD-10-CM | POA: Diagnosis not present

## 2022-02-17 DIAGNOSIS — M6281 Muscle weakness (generalized): Secondary | ICD-10-CM | POA: Diagnosis not present

## 2022-02-17 DIAGNOSIS — M25562 Pain in left knee: Secondary | ICD-10-CM | POA: Diagnosis not present

## 2022-02-17 NOTE — Patient Instructions (Signed)
DUE TO COVID-19 ONLY TWO VISITORS  (aged 56 and older)  ARE ALLOWED TO COME WITH YOU AND STAY IN THE WAITING ROOM ONLY DURING PRE OP AND PROCEDURE.   **NO VISITORS ARE ALLOWED IN THE SHORT STAY AREA OR RECOVERY ROOM!!**  IF YOU WILL BE ADMITTED INTO THE HOSPITAL YOU ARE ALLOWED ONLY FOUR SUPPORT PEOPLE DURING VISITATION HOURS ONLY (7 AM -8PM)   The support person(s) must pass our screening, gel in and out, and wear a mask at all times, including in the patient's room. Patients must also wear a mask when staff or their support person are in the room. Visitors GUEST BADGE MUST BE WORN VISIBLY  One adult visitor may remain with you overnight and MUST be in the room by 8 P.M.     Your procedure is scheduled on: 02/25/22   Report to [facility] Main Entrance    Report to admitting at  11:35 AM   Call this number if you have problems the morning of surgery [phone]   Do not eat food :After Midnight.   After Midnight you may have the following liquids until __10:50____ AM/  DAY OF SURGERY  Water Black Coffee (sugar ok, NO MILK/CREAM OR CREAMERS)  Tea (sugar ok, NO MILK/CREAM OR CREAMERS) regular and decaf                             Plain Jell-O (NO RED)                                           Fruit ices (not with fruit pulp, NO RED)                                     Popsicles (NO RED)                                                                  Juice: apple, WHITE grape, WHITE cranberry Sports drinks like Gatorade (NO RED)                   The day of surgery:  Drink ONE (1) G2 at  10:15 AM the morning of surgery. Drink in one sitting. Do not sip.  This drink was given to you during your hospital  pre-op appointment visit. Nothing else to drink after completing the   G2. At 10:50 AM          If you have questions, please contact your surgeon's office.   Oral Hygiene is also important to reduce your risk of infection.                                    Remember  - BRUSH YOUR TEETH THE MORNING OF SURGERY WITH YOUR REGULAR TOOTHPASTE  DENTURES WILL BE REMOVED PRIOR TO SURGERY PLEASE DO NOT APPLY "Poly grip" OR ADHESIVES!!!   Do NOT smoke after Midnight   Take these medicines the morning of  surgery with A SIP OF WATER: Use your inhalers and bring them with you                                                                                                                            Pregabalin-Lyrica                                                                                                                            Bupropion-Wellbutrin                                                                                                                            Zyrtec                                                                                                                            Atorvastatin  Omeprazole                                                                                                                            Oxybutynin How to Manage Your Diabetes Before and After Surgery  Why is it important to control my blood sugar before and after surgery? Improving blood sugar levels before and after surgery helps healing and can limit problems. A way of improving blood sugar control is eating a healthy diet by:  Eating less sugar and carbohydrates  Increasing activity/exercise  Talking with your doctor about reaching your blood sugar goals High blood sugars (greater than 180 mg/dL) can raise your risk of infections and slow your recovery, so you will need to focus on controlling your diabetes during the weeks before surgery. Make sure that the doctor who takes care of your diabetes knows about your planned surgery including the date and location.  How do I manage my blood sugar before surgery? Check your blood sugar at  least 4 times a day, starting 2 days before surgery, to make sure that the level is not too high or low. Check your blood sugar the morning of your surgery when you wake up and every 2 hours until you get to the Short Stay unit. If your blood sugar is less than 70 mg/dL, you will need to treat for low blood sugar: Do not take insulin. Treat a low blood sugar (less than 70 mg/dL) with  cup of clear juice (cranberry or apple), 4 glucose tablets, OR glucose gel. Recheck blood sugar in 15 minutes after treatment (to make sure it is greater than 70 mg/dL). If your blood sugar is not greater than 70 mg/dL on recheck, call [phone] for further instructions. Report your blood sugar to the short stay nurse when you get to Short Stay.  If you are admitted to the hospital after surgery: Your blood sugar will be checked by the staff and you will probably be given insulin after surgery (instead of oral diabetes medicines) to make sure you have good blood sugar levels. The goal for blood sugar control after surgery is 80-180 mg/dL.   WHAT DO I DO ABOUT MY DIABETES MEDICATION?  Stop your Jardiance 3 days (72 hours) prior to day of surgery. Last dose will be 02/21/22.  Stop your Mounjaro 7 days prior to day of surgery. If you take it on Mondays your last dose will be 02/15/22. Do not take it on Monday 02/22/22.  Do not take oral diabetes medicines (pills) the morning of surgery.  THE DAY BEFORE SURGERY, take 40 units of   Lantus    insulin. (Your usual dose in the AM) none at night.       THE MORNING OF SURGERY, take 20  units of   Lantus   insulin.      Bring CPAP mask and tubing day of surgery.  You may not have any metal on your body including hair pins, jewelry, and body piercing             Do not wear make-up, lotions, powders, perfumes/cologne, or deodorant  Do not wear nail polish including gel and S&S, artificial/acrylic nails, or any other type of covering on  natural nails including finger and toenails. If you have artificial nails, gel coating, etc. that needs to be removed by a nail salon please have this removed prior to surgery or surgery may need to be canceled/ delayed if the surgeon/ anesthesia feels like they are unable to be safely monitored.   Do not shave  48 hours prior to surgery.     Do not bring valuables to the hospital. [place].   Contacts, glasses, or bridgework may not be worn into surgery.    DO NOT [place].     Patients discharged on the day of surgery will not be allowed to drive home.  Someone NEEDS to stay with you for the first 24 hours after anesthesia.   Special Instructions: Bring a copy of your healthcare power of attorney and living will documents the day of surgery if you haven't scanned them before.              Please read over the following fact sheets you were given: IF YOU HAVE QUESTIONS ABOUT YOUR PRE-OP INSTRUCTIONS PLEASE CALL [phone]    [facility] - Preparing for Surgery Before surgery, you can play an important role.  Because skin is not sterile, your skin needs to be as free of germs as possible.  You can reduce the number of germs on your skin by washing with CHG (chlorahexidine gluconate) soap before surgery.  CHG is an antiseptic cleaner which kills germs and bonds with the skin to continue killing germs even after washing. Please DO NOT use if you have an allergy to CHG or antibacterial soaps.  If your skin becomes reddened/irritated stop using the CHG and inform your nurse when you arrive at Short Stay. Do not shave (including legs and underarms) for at least 48 hours prior to the first CHG shower.   Please follow these instructions carefully:  1.  Shower with CHG Soap the night before surgery and the  morning of Surgery.  2.  If you choose to wash your hair, wash your hair first as usual with your  normal   shampoo.  3.  After you shampoo, rinse your hair and body thoroughly to remove the  shampoo.                            4.  Use CHG as you would any other liquid soap.  You can apply chg directly  to the skin and wash                       Gently with a scrungie or clean washcloth.  5.  Apply the CHG Soap to your body ONLY FROM THE NECK DOWN.   Do not use on face/ open                           Wound or open sores. Avoid contact with eyes, ears mouth and genitals (private  parts).                       Wash face,  Genitals (private parts) with your normal soap.             6.  Wash thoroughly, paying special attention to the area where your surgery  will be performed.  7.  Thoroughly rinse your body with warm water from the neck down.  8.  DO NOT shower/wash with your normal soap after using and rinsing off  the CHG Soap.             9.  Pat yourself dry with a clean towel.            10.  Wear clean pajamas.            11.  Place clean sheets on your bed the night of your first shower and do not  sleep with pets. Day of Surgery : Do not apply any lotions/deodorants the morning of surgery.  Please wear clean clothes to the hospital/surgery center.  FAILURE TO FOLLOW THESE INSTRUCTIONS MAY RESULT IN THE CANCELLATION OF YOUR SURGERY    ________________________________________________________________________  Incentive Spirometer  An incentive spirometer is a tool that can help keep your lungs clear and active. This tool measures how well you are filling your lungs with each breath. Taking long deep breaths may help reverse or decrease the chance of developing breathing (pulmonary) problems (especially infection) following: A long period of time when you are unable to move or be active. BEFORE THE PROCEDURE  If the spirometer includes an indicator to show your best effort, your nurse or respiratory therapist will set it to a desired goal. If possible, sit up straight or lean slightly forward.  Try not to slouch. Hold the incentive spirometer in an upright position. INSTRUCTIONS FOR USE  Sit on the edge of your bed if possible, or sit up as far as you can in bed or on a chair. Hold the incentive spirometer in an upright position. Breathe out normally. Place the mouthpiece in your mouth and seal your lips tightly around it. Breathe in slowly and as deeply as possible, raising the piston or the ball toward the top of the column. Hold your breath for 3-5 seconds or for as long as possible. Allow the piston or ball to fall to the bottom of the column. Remove the mouthpiece from your mouth and breathe out normally. Rest for a few seconds and repeat Steps 1 through 7 at least 10 times every 1-2 hours when you are awake. Take your time and take a few normal breaths between deep breaths. The spirometer may include an indicator to show your best effort. Use the indicator as a goal to work toward during each repetition. After each set of 10 deep breaths, practice coughing to be sure your lungs are clear. If you have an incision (the cut made at the time of surgery), support your incision when coughing by placing a pillow or rolled up towels firmly against it. Once you are able to get out of bed, walk around indoors and cough well. You may stop using the incentive spirometer when instructed by your caregiver.  RISKS AND COMPLICATIONS Take your time so you do not get dizzy or light-headed. If you are in pain, you may need to take or ask for pain medication before doing incentive spirometry. It is harder to take a deep breath if you are having pain.  AFTER USE Rest and breathe slowly and easily. It can be helpful to keep track of a log of your progress. Your caregiver can provide you with a simple table to help with this. If you are using the spirometer at home, follow these instructions: [place] IF:  You are having difficultly using the spirometer. You have trouble using the spirometer  as often as instructed. Your pain medication is not giving enough relief while using the spirometer. You develop fever of 100.5 F (38.1 C) or higher. SEEK IMMEDIATE MEDICAL CARE IF:  You cough up bloody sputum that had not been present before. You develop fever of 102 F (38.9 C) or greater. You develop worsening pain at or near the incision site. MAKE SURE YOU:  Understand these instructions. Will watch your condition. Will get help right away if you are not doing well or get worse. Document Released: 05/24/2006 Document Revised: 04/05/2011 Document Reviewed: 07/25/2006 [facility] Patient Information 2014 [place], [place].   ________________________________________________________________________

## 2022-02-17 NOTE — Therapy (Signed)
OUTPATIENT PHYSICAL THERAPY TREATMENT NOTE   Patient Name: [name] MRN: [id number] DOB:Jan 05, 1966, 57 y.o., female Today's Date: 02/17/2022  PCP: Fayette Pho, MD    REFERRING PROVIDER: Otho Darner, MD    PT End of Session - 02/17/22 1548     Visit Number 5    Number of Visits 12    Date for PT Re-Evaluation 03/18/22    Authorization Type [employer]    PT Start Time 1547    PT Stop Time 1635    PT Time Calculation (min) 48 min    Activity Tolerance Patient limited by pain;Patient tolerated treatment well    Behavior During Therapy [facility] for tasks assessed/performed                Past Medical History:  Diagnosis Date   ALLERGIC RHINITIS    takes Zyrtec daily   Anxiety    Arthritis    Asthma    Albuterol prn;Symbicort daily and Singulair daily   Bronchitis    hx of   CAP (community acquired pneumonia) 02/20/2015   Carpal tunnel syndrome    right   Chronic back pain    Constipation    Miralax prn   Depression    takes Cymbalta daily   Diabetes (HCC)    type 2    Dizziness    pt states she gets off balance occasionally   Eczema    Gallstones 2010   GERD (gastroesophageal reflux disease)    takes Omeprazole daily   Headache(784.0)    couple of times a week   Hemorrhoids    Hypertension    takes Prinizide daily and Clonidine on Mondays   Hypoventilation associated with obesity syndrome (HCC)    Insomnia    Irritable bladder    Joint pain    Morbid (severe) obesity due to excess calories (HCC) 04/10/2007   Restrictive changes on pfts 11/30/2007     Myasthenia gravis 1994   Positive Acetylcholine receptor Ab and single fiber EMG Dr. Clarisa [facility] 1996- Dr. 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 Coralyn Helling- 10/30/2007 ANA positive, ANA titer  negative, RF less than 20   Rheumatoid arthritis(714.0)    Sarcoidosis 12/23/2010   Derm  dx NCG  12/03/10 (path report in EPIC)    Overview:  Overview:  Diagnosed on skin biopsy November 2012  Last Assessment & Plan:  Labs are unremarkable. CXR improved/stable. PFT wnl. I am happy patient had these tests and we have a baseline for her. At this time, I do not see an indication for starting chronic steroids daily. Patient agrees. Her skin lesions are bothering her the most and syst   Shortness of breath    can be sitting as well as exertion   Skin irritation    skin itchy   Trigger finger    Urinary frequency    takes Ditropan daily   Viral meningitis    history of viral meningitis   Past Surgical History:  Procedure Laterality Date   BRONCHOSCOPY  08/2007   CARPAL TUNNEL RELEASE  05/20/2011   Procedure: CARPAL TUNNEL RELEASE;  Surgeon: [name], MD;  Location: [facility] OR;  Service: Orthopedics;  Laterality: Right;   CARPAL TUNNEL RELEASE Right 11/22/2019   Procedure: RIGHT HAND CARPAL TUNNEL RELEASE;  Surgeon: Jones Broom, MD;  Location: [facility] ORS;  Service: Orthopedics;  Laterality: Right;   CESAREAN SECTION  09/15/2004   COLONOSCOPY N/A 01/21/2014   Procedure: COLONOSCOPY;  Surgeon: Iva Boop, MD;  Location: [facility] ENDOSCOPY;  Service: Endoscopy;  Laterality: N/A;   cortisone injection     receives an injection every 56months   ESOPHAGOGASTRODUODENOSCOPY     ESOPHAGOGASTRODUODENOSCOPY N/A 01/21/2014   Procedure: ESOPHAGOGASTRODUODENOSCOPY (EGD);  Surgeon: Iva Boop, MD;  Location: [facility];  Service: Endoscopy;  Laterality: N/A;   LAPAROSCOPIC CHOLECYSTECTOMY  07/2008   by Dr. Cyndia Bent   Thymus resection  08/25/1993   TRIGGER FINGER RELEASE  05/20/2011   Procedure: RELEASE  TRIGGER FINGER/A-1 PULLEY;  Surgeon: [name], MD;  Location: [facility] OR;  Service: Orthopedics;  Laterality: Right;  RIGHT TRIGGER THUMB RELEASE AND RIGHT CARPAL TUNNEL RELEASE   Patient Active Problem List   Diagnosis Date Noted   Drug-induced constipation 12/09/2021   Anxiety 09/23/2021   Chronic right shoulder pain 03/24/2021   Left wrist pain 03/24/2021   Diabetic neuropathy (HCC) 02/25/2021   Healthcare maintenance 10/21/2020   Hyperlipidemia (primary prevention, LDL goal <70) 09/04/2020   Wheelchair dependence 05/05/2020   Carpal tunnel syndrome of right wrist, RECURRENT 10/15/2019   Insomnia, idiopathic 09/30/2018   Grade I diastolic dysfunction 09/30/2018   Encounter for chronic pain management 02/11/2014   Diabetes (HCC) 02/11/2014   Cystocele 09/14/2012   Chronic pain of both knees 05/28/2010   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   Allergic rhinitis 05/30/2008   Carpal tunnel syndrome of left wrist 01/17/2008   OBESITY HYPOVENTILATION SYNDROME 12/27/2007   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   Obstructive sleep apnea 10/30/2007   BMI 50.0-59.9, adult (HCC) 04/10/2007   MYASTHENIA 03/07/2007   Depression, recurrent (HCC) 03/24/2006   Essential (primary) hypertension 03/24/2006   Gastro-esophageal reflux disease without esophagitis 03/24/2006    THERAPY DIAG:  Chronic pain of left knee  Chronic pain of right knee  Muscle weakness (generalized)   Rationale for Evaluation and Treatment Rehabilitation  REFERRING DIAG: BILATERAL KNEE OSTEOARTHRITIS :HIP MUSCLE ; KNEE FLEXION   PERTINENT HISTORY:  HPI Patient is 57 year old female comes in today with bilateral knee pain second opinion.  She has been told that she has arthritis in both knees.  She was seen at [facility] orthopedics recently and told she needed to work on strengthening her legs and they have actually ordered therapy.  They also discussed with her that she needed  to work on weight loss which she is doing.  She states she has had bilateral knee pain for some 30 years.  No known injury.  Radiographs in 2019 are available and show severe tricompartmental arthritis of the right knee.  She does take diclofenac Tylenol Lyrica and oxycodone for her knee pain but states this really helped.  She is also tried injections both cortisone and gel injections without any real relief.  She notes that she has been basically dependent upon a motorized wheelchair for the last 2 years.  She does ambulate short distances though.  She states prior to the motorized wheelchair she was walking with a walker.  She is lost from 430 pounds down to 316 pounds.  States due to her myasthenia  gravis she has been on prednisone.  Has gained a lot of weight over the years.  She has also had weakness in the extremities for years.  Reports that her diabetes is under good control at this point in time.  PRECAUTIONS/RESTRICTIONS:   FALL  SUBJECTIVE:  Patient reports continued BIL knee pain, RT 6/10 and LT 4/10 before entering pool  PAIN:  Are you having pain? Yes: NPRS scale: 6-7/10 Pain location: B knees Pain description: ache Aggravating factors: everything Relieving factors: nothing  OBJECTIVE: (objective measures completed at initial evaluation unless otherwise dated)  DIAGNOSTIC FINDINGS: CLINICAL DATA:  56 year old female with a history of chronic right knee pain   EXAM: RIGHT KNEE 3 VIEWS   COMPARISON:  08/03/2013   FINDINGS: No acute displaced fracture. Advanced joint space narrowing of the mediolateral compartment with marginal osteophyte formation, sclerotic changes. Degenerative changes of the patellofemoral joint. Changes have not progressed since the comparison of 08/03/2013   IMPRESSION: Advanced tricompartmental osteoarthritis, worse than the comparison study of 2015.   No acute bony abnormality identified.     Electronically Signed   By: Corrie Mckusick D.O.    On: 11/09/2017 09:34     PATIENT SURVEYS:  FOTO 21(43 predicted)    COGNITION: Overall cognitive status: Within functional limits for tasks assessed                         SENSATION: Not tested   MUSCLE LENGTH: UTA due to pain levels   POSTURE:  UTA due to poor standing tolerance   PALPATION: deferred   LOWER EXTREMITY ROM:   Active ROM Right eval Left eval  Hip flexion      Hip extension      Hip abduction      Hip adduction      Hip internal rotation      Hip external rotation      Knee flexion 92d 100d  Knee extension -25d -25  Ankle dorsiflexion      Ankle plantarflexion      Ankle inversion      Ankle eversion       (Blank rows = not tested)   LOWER EXTREMITY MMT:   MMT Right eval Left eval  Hip flexion 3 3  Hip extension      Hip abduction 3 3  Hip adduction      Hip internal rotation      Hip external rotation      Knee flexion 3+ 3+  Knee extension 3+ 3+  Ankle dorsiflexion      Ankle plantarflexion 3+ 3+  Ankle inversion      Ankle eversion       (Blank rows = not tested)   LOWER EXTREMITY SPECIAL TESTS:  UTA due to pain and limited tolerance to testing positions    FUNCTIONAL TESTS:  30 seconds chair stand test 0 reps   GAIT: Distance walked: 59ft Assistive device utilized: Wheelchair (power) Level of assistance: Complete Independence Comments: Patient using power chair for mobility      PATIENT EDUCATION:  Education details: Discussed eval findings, rehab rationale and POC and patient is in agreement  Person educated: Patient Education method: Explanation Education comprehension: verbalized understanding and needs further education   HOME EXERCISE PROGRAM: Access Code: [email or web address] URL: [email or web address] Date: 01/20/2022 Prepared by: Sharlynn Oliphant   Exercises - Seated Long Arc Quad  - 5 x daily - 5 x weekly - 1 sets - 10 reps -  Standing Heel Raise with Support  - 5 x daily - 5 x weekly - 1 sets - 10  reps  [facility] Adult PT Treatment:                                                DATE: 02-17-22 Aquatic therapy at [address] - therapeutic pool temp 91 degrees in lap pool Pt enters building via W/C and son accompanying. Treatment took place in water 3.8 to  4 ft 8 in.feet deep depending upon activity.  Pt entered and exited the pool via stair and handrails   Pain at initiation of RX  6/10  RT knee and LT 4/10    At end  RT 7/10 and LT 4/10  Aquatic Therapy:  Water walking for warm up forwards, and backwards and side stepping Aquastretch for BiIL quadriceps and medial knees BIL At edge of pool, pt performed LE exercise: all exercises below with resistive swimming ankle fins Heel raises - 2x20 Walking march Hamstring curl x20 BIL  Hip circles CW/CCW x10 BIL   Hip abd/add x20 BIL Runners Stretch x 30" x 2 BIL Hamstring stretch x 30" x 2 BIL Step ups on submerged step x10 fwd/lat BIL Step downs with heel tap RT on step x 10, LT on step 2 x 5 Monster walks laterally across pool x 3 15 ft each     Pt requires the buoyancy of water for active assisted exercises with buoyancy supported for strengthening and AROM exercises. Hydrostatic pressure also supports joints by unweighting joint load by at least 50 % in 3-4 feet depth water. 80% in chest to neck deep water. Water will provide assistance with movement using the current and laminar flow while the buoyancy reduces weight bearing. Pt requires the viscosity of the water for resistance with strengthening exercises.  [facility] Adult PT Treatment:                                                DATE: 02/12/2022 Aquatic therapy at [address] - therapeutic pool temp approximately 92 degrees. Pt enters building via W/C and son accompanying. Treatment took place in water 3.8 to  4 ft 8 in.feet deep depending upon activity.  Pt entered and exited the pool via stair and handrails with step to pattern.  Aquatic  Therapy:  Water walking for warm up forwards, and backwards and side stepping holding blue DB Therapeutic Exercise: Step ups on submerged step x10 fwd/lat BIL Walking march Lateral walking with DB push downs Fwd walking with DB push downs At edge of pool, pt performed LE exercise: Heel raises - 2x20 Hamstring curl x20 BIL  Hip circles CW/CCW x10 BIL   Hip abd/add x20 BIL   On submerged bench Bicycle kicks x 1' Reverse bicycle kicks x 1' Flutter kicks x 1' Scissor kicks x 1' Pt requires the buoyancy of water for active assisted exercises with buoyancy supported for strengthening and AROM exercises. Hydrostatic pressure also supports joints by unweighting joint load by at least 50 % in 3-4 feet depth water. 80% in chest to neck deep water. Water will provide assistance with movement using the current and laminar flow while the buoyancy reduces weight  bearing. Pt requires the viscosity of the water for resistance with strengthening exercises.   [facility] Adult PT Treatment:                                                DATE: 02-10-22  Aquatic therapy at [facility] [address] - therapeutic pool temp 87 degrees in lap pool Pt enters building via W/C and son accompanying. Treatment took place in water 3.8 to  4 ft 8 in.feet deep depending upon activity.  Pt entered and exited the pool via stair and handrails   Pain at initiation of RX  8/10 bil knees.  At end  RT 5/10 and LT 4/10 Pt  was educated on  beneficial therapeutic effects of water while ambulating to acclimate to water walking forward, backward and side stepping.  Pt educated on neutral posture and hip hinging in seated position with water at chest level x 10 with stretch to low back and then x 10 with back at pool wall at external cue, VC for neck tucked to prevent hyperextension. Aquatic Therapy:  Water walking for warm up forwards, and backwards and side stepping   At edge of pool, pt performed LE exercise: Heel raises -  2x20 Walking march Hamstring curl x20 BIL  Hip circles CW/CCW x10 BIL   Hip abd/add x20 BIL Lateral walking with DB push downs Fwd walking with DB push downs Lateral "jogging" with DB  Aquastretch for BiILquadriceps and medial knees BIL  Pt requires the buoyancy of water for active assisted exercises with buoyancy supported for strengthening and AROM exercises. Hydrostatic pressure also supports joints by unweighting joint load by at least 50 % in 3-4 feet depth water. 80% in chest to neck deep water. Water will provide assistance with movement using the current and laminar flow while the buoyancy reduces weight bearing. Pt requires the viscosity of the water for resistance with strengthening exercises.    ASSESSMENT:   CLINICAL IMPRESSION:  Ms. Phillis presents to aquatic PT session reporting improved BIL knee pain today at RT 6/10 and LT 4/10.  At end of session RT 5/10 and LT 0/10 after challenging workout. Pt is scheduled for carpal tunnel surgery next week and has one more aquatic session.  Session today focused on BIL LE strengthening and general conditioning in the aquatic environment for use of buoyancy to offload joints and the viscosity of water as resistance during therapeutic exercise. Pt also benefited from  Aqua Stretch and emphasis on hamstrings/quads with added resistive fins.  Patient was able to tolerate all prescribed exercises in the aquatic environment with no adverse effects. Patient continues to benefit from skilled PT services on land and aquatic based and should be progressed as able to improve functional independence.    OBJECTIVE IMPAIRMENTS: Abnormal gait, decreased activity tolerance, decreased balance, decreased endurance, decreased knowledge of condition, decreased knowledge of use of DME, decreased mobility, difficulty walking, decreased ROM, decreased strength, obesity, and pain.    ACTIVITY LIMITATIONS: carrying, lifting, standing, squatting, stairs, and  transfers   PERSONAL FACTORS: Age, Fitness, Past/current experiences, and Time since onset of injury/illness/exacerbation are also affecting patient's functional outcome.    REHAB POTENTIAL: Fair based on chronicity, unsuccessful conservative interventions and advanced OA of B knees   CLINICAL DECISION MAKING: Stable/uncomplicated   EVALUATION COMPLEXITY: Low     GOALS: Goals reviewed with  patient? No   SHORT TERM GOALS: Target date: 02/03/2022   Patient to demonstrate independence in HEP  Baseline:[email or web address] Goal status: Ongoing Pt reports non-compliance 02/12/22   2.  Initiate aquatic program Baseline: TBD Goal status: Met Initiated      LONG TERM GOALS: Target date: 02/17/2022     Increase BLE strength to 4-/5 in deficit areas Baseline:  MMT Right eval Left eval  Hip flexion 3 3  Hip extension      Hip abduction 3 3  Hip adduction      Hip internal rotation      Hip external rotation      Knee flexion 3+ 3+  Knee extension 3+ 3+  Ankle dorsiflexion      Ankle plantarflexion 3+ 3+    Goal status: INITIAL   2.  Increase FOTO score to 43 Baseline: 21 Goal status: INITIAL   3.  Increase B knee extension AROM to -15d B Baseline: -25d B Goal status: INITIAL       PLAN:   PT FREQUENCY: 1-2x/week   PT DURATION: 4 weeks   PLANNED INTERVENTIONS: Therapeutic exercises, Therapeutic activity, Neuromuscular re-education, Balance training, Gait training, Patient/Family education, Self Care, Joint mobilization, Stair training, DME instructions, and Re-evaluation   PLAN FOR NEXT SESSION: HEP review and update, ROM, strengthening, gait training and functional activities  Garen Lah, PT, [facility] Certified Exercise Expert for the Aging Adult  02/17/22 4:44 PM Phone: [phone] Fax: [phone]

## 2022-02-18 ENCOUNTER — Encounter (HOSPITAL_COMMUNITY): Payer: Self-pay

## 2022-02-18 ENCOUNTER — Encounter (HOSPITAL_COMMUNITY)
Admission: RE | Admit: 2022-02-18 | Discharge: 2022-02-18 | Disposition: A | Discharge status: Home / Self Care | Payer: 59 | Source: Ambulatory Visit | Attending: Orthopedic Surgery | Admitting: Orthopedic Surgery

## 2022-02-18 ENCOUNTER — Other Ambulatory Visit: Payer: Self-pay

## 2022-02-18 DIAGNOSIS — Z01818 Encounter for other preprocedural examination: Secondary | ICD-10-CM | POA: Insufficient documentation

## 2022-02-18 DIAGNOSIS — R9431 Abnormal electrocardiogram [ECG] [EKG]: Secondary | ICD-10-CM | POA: Insufficient documentation

## 2022-02-18 DIAGNOSIS — E1165 Type 2 diabetes mellitus with hyperglycemia: Secondary | ICD-10-CM | POA: Diagnosis not present

## 2022-02-18 DIAGNOSIS — Z794 Long term (current) use of insulin: Secondary | ICD-10-CM | POA: Insufficient documentation

## 2022-02-18 LAB — BASIC METABOLIC PANEL
Anion gap: 8 (ref 5–15)
BUN: 8 mg/dL (ref 6–20)
CO2: 28 mmol/L (ref 22–32)
Calcium: 9.1 mg/dL (ref 8.9–10.3)
Chloride: 103 mmol/L (ref 98–111)
Creatinine, Ser: 0.81 mg/dL (ref 0.44–1.00)
GFR, Estimated: >60 mL/min (ref >60)
Glucose, Bld: 97 mg/dL (ref 70–99)
Potassium: 4 mmol/L (ref 3.5–5.1)
Sodium: 139 mmol/L (ref 135–145)

## 2022-02-18 LAB — CBC
HCT: 40.4 % (ref 36.0–46.0)
Hemoglobin: 12.6 g/dL (ref 12.0–15.0)
MCH: 26.8 pg (ref 26.0–34.0)
MCHC: 31.2 g/dL (ref 30.0–36.0)
MCV: 85.8 fL (ref 80.0–100.0)
Platelets: 302 10*3/uL (ref 150–400)
RBC: 4.71 MIL/uL (ref 3.87–5.11)
RDW: 16.3 % — ABNORMAL HIGH (ref 11.5–15.5)
WBC: 8.5 10*3/uL (ref 4.0–10.5)
nRBC: 0 % (ref 0.0–0.2)

## 2022-02-18 LAB — HEMOGLOBIN A1C
Hgb A1c MFr Bld: 6.6 % — ABNORMAL HIGH (ref 4.8–5.6)
Mean Plasma Glucose: 142.72 mg/dL

## 2022-02-18 LAB — GLUCOSE, CAPILLARY: Glucose-Capillary: 94 mg/dL (ref 70–99)

## 2022-02-18 NOTE — Therapy (Addendum)
OUTPATIENT PHYSICAL THERAPY TREATMENT NOTE/Discharge Note PHYSICAL THERAPY DISCHARGE SUMMARY  Visits from Start of Care: 6  Current functional level related to goals / functional outcomes: Unknown   Remaining deficits: Unknown,  pt scheduled for Carpal tunnel surgery   Education / Equipment: Initial HEP   Patient agrees to discharge. Patient goals were partially met. Patient is being discharged due to not returning since the last visit.    Patient Name: [name] MRN: [id number] DOB:06-05-1965, 57 y.o., female Today's Date: 02/19/2022  PCP: Ezequiel Essex, MD    REFERRING PROVIDER: Gentry Fitz, MD    PT End of Session - 02/19/22 1620     Visit Number 6    Number of Visits 12    Date for PT Re-Evaluation 03/18/22    Authorization Type U[employer]    PT Start Time 5573   Pt arrived 20 mins late to appt   PT Stop Time 1700    PT Time Calculation (min) 40 min    Activity Tolerance Patient limited by pain;Patient tolerated treatment well    Behavior During Therapy [facility] for tasks assessed/performed              Past Medical History:  Diagnosis Date   ALLERGIC RHINITIS    takes Zyrtec daily   Anxiety    Arthritis    Asthma    Albuterol prn;Symbicort daily and Singulair daily   Bronchitis    hx of   CAP (community acquired pneumonia) 02/20/2015   Carpal tunnel syndrome    right   Chronic back pain    Constipation    Miralax prn   Depression    takes Cymbalta daily   Diabetes ([place])    type 2    Eczema    Gallstones 2010   GERD (gastroesophageal reflux disease)    takes Omeprazole daily   Hemorrhoids    Hypertension    takes Prinizide daily and Clonidine on Mondays   Hypoventilation associated with obesity syndrome (HCC)    Insomnia    Irritable bladder    Joint pain    Morbid (severe) obesity due to excess calories ([place]) 04/10/2007   Restrictive changes on pfts 11/30/2007     Myasthenia gravis 1994   Positive Acetylcholine receptor Ab and  single fiber EMG [facility]- Dr. 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 Chesley Mires- 10/30/2007 ANA positive, ANA titer  negative, RF less than 20   Rheumatoid arthritis(714.0)    Sarcoidosis 12/23/2010   Derm  dx NCG  12/03/10 (path report in EPIC)    Overview:  Overview:  Diagnosed on skin biopsy November 2012  Last Assessment & Plan:  Labs are unremarkable. CXR improved/stable. PFT wnl. I am happy patient had these tests and we have a baseline for her. At this time, I do not see an indication for starting chronic steroids daily. Patient agrees. Her skin lesions are bothering her the most and syst   Shortness of breath    can be sitting as well as exertion   Skin  irritation    skin itchy   Trigger finger    Urinary frequency    takes Ditropan daily   Viral meningitis    history of viral meningitis   Past Surgical History:  Procedure Laterality Date   BRONCHOSCOPY  08/2007   CARPAL TUNNEL RELEASE  05/20/2011   Procedure: CARPAL TUNNEL RELEASE;  Surgeon: Nita Sells, MD;  Location: [place];  Service: Orthopedics;  Laterality: Right;   CARPAL TUNNEL RELEASE Right 11/22/2019   Procedure: RIGHT HAND CARPAL TUNNEL RELEASE;  Surgeon: Tania Ade, MD;  Location: [facility];  Service: Orthopedics;  Laterality: Right;   CESAREAN SECTION  09/15/2004   COLONOSCOPY N/A 01/21/2014   Procedure: COLONOSCOPY;  Surgeon: Gatha Mayer, MD;  Location: [facility];  Service: Endoscopy;  Laterality: N/A;   cortisone injection     receives an injection every 5months   ESOPHAGOGASTRODUODENOSCOPY      ESOPHAGOGASTRODUODENOSCOPY N/A 01/21/2014   Procedure: ESOPHAGOGASTRODUODENOSCOPY (EGD);  Surgeon: Gatha Mayer, MD;  Location: [facility];  Service: Endoscopy;  Laterality: N/A;   LAPAROSCOPIC CHOLECYSTECTOMY  07/2008   by Dr. Neldon Mc   Thymus resection  08/25/1993   TRIGGER FINGER RELEASE  05/20/2011   Procedure: RELEASE TRIGGER FINGER/A-1 PULLEY;  Surgeon: Nita Sells, MD;  Location: [place];  Service: Orthopedics;  Laterality: Right;  RIGHT TRIGGER THUMB RELEASE AND RIGHT CARPAL TUNNEL RELEASE   Patient Active Problem List   Diagnosis Date Noted   Drug-induced constipation 12/09/2021   Anxiety 09/23/2021   Chronic right shoulder pain 03/24/2021   Left wrist pain 03/24/2021   Diabetic neuropathy ([place]) 02/25/2021   Healthcare maintenance 10/21/2020   Hyperlipidemia (primary prevention, LDL goal <70) 09/04/2020   Wheelchair dependence 05/05/2020   Carpal tunnel syndrome of right wrist, RECURRENT 10/15/2019   Insomnia, idiopathic 09/30/2018   Grade I diastolic dysfunction 36/62/9476   Encounter for chronic pain management 02/11/2014   Diabetes ([place]) 02/11/2014   Cystocele 09/14/2012   Chronic pain of both knees 05/28/2010   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   Allergic rhinitis 05/30/2008   Carpal tunnel syndrome of left wrist 01/17/2008   OBESITY HYPOVENTILATION SYNDROME 12/27/2007   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   Obstructive sleep apnea 10/30/2007   BMI 50.0-59.9, adult ([place]) 04/10/2007   MYASTHENIA 03/07/2007   Depression, recurrent ([place]) 03/24/2006   Essential (primary) hypertension 03/24/2006   Gastro-esophageal reflux disease without esophagitis 03/24/2006    THERAPY DIAG:  Chronic pain of left knee  Chronic pain of right knee  Muscle weakness (generalized)   Rationale for Evaluation and Treatment Rehabilitation  REFERRING DIAG: BILATERAL KNEE OSTEOARTHRITIS :HIP MUSCLE ; KNEE FLEXION   PERTINENT  HISTORY:  HPI Patient is 57 year old female comes in today with bilateral knee pain second opinion.  She has been told that she has arthritis in both knees.  She was seen at [place] recently and told she needed to work on strengthening her legs and they have actually ordered therapy.  They also discussed with her that she needed to work on weight loss which she is doing.  She states she has had bilateral knee pain for some 30 years.  No known injury.  Radiographs in 2019 are available and show severe tricompartmental arthritis of the right knee.  She does take diclofenac Tylenol Lyrica and oxycodone for her knee pain but states this really helped.  She is also tried injections both cortisone and gel injections without any real relief.  She notes that  she has been basically dependent upon a motorized wheelchair for the last 2 years.  She does ambulate short distances though.  She states prior to the motorized wheelchair she was walking with a walker.  She is lost from 430 pounds down to 316 pounds.  States due to her myasthenia gravis she has been on prednisone.  Has gained a lot of weight over the years.  She has also had weakness in the extremities for years.  Reports that her diabetes is under good control at this point in time.  PRECAUTIONS/RESTRICTIONS:   FALL  SUBJECTIVE:  Patient reports L knee is hurting more than the R today.   PAIN:  Are you having pain? Yes: NPRS scale: 8/10 (L>R) Pain location: B knees Pain description: ache Aggravating factors: everything Relieving factors: nothing  OBJECTIVE: (objective measures completed at initial evaluation unless otherwise dated)  DIAGNOSTIC FINDINGS: CLINICAL DATA:  57 year old female with a history of chronic right knee pain   EXAM: RIGHT KNEE 3 VIEWS   COMPARISON:  08/03/2013   FINDINGS: No acute displaced fracture. Advanced joint space narrowing of the mediolateral compartment with marginal osteophyte  formation, sclerotic changes. Degenerative changes of the patellofemoral joint. Changes have not progressed since the comparison of 08/03/2013   IMPRESSION: Advanced tricompartmental osteoarthritis, worse than the comparison study of 2015.   No acute bony abnormality identified.     Electronically Signed   By: Corrie Mckusick D.O.   On: 11/09/2017 09:34     PATIENT SURVEYS:  FOTO 21(43 predicted)    COGNITION: Overall cognitive status: Within functional limits for tasks assessed                         SENSATION: Not tested   MUSCLE LENGTH: UTA due to pain levels   POSTURE:  UTA due to poor standing tolerance   PALPATION: deferred   LOWER EXTREMITY ROM:   Active ROM Right eval Left eval  Hip flexion      Hip extension      Hip abduction      Hip adduction      Hip internal rotation      Hip external rotation      Knee flexion 92d 100d  Knee extension -25d -25  Ankle dorsiflexion      Ankle plantarflexion      Ankle inversion      Ankle eversion       (Blank rows = not tested)   LOWER EXTREMITY MMT:   MMT Right eval Left eval  Hip flexion 3 3  Hip extension      Hip abduction 3 3  Hip adduction      Hip internal rotation      Hip external rotation      Knee flexion 3+ 3+  Knee extension 3+ 3+  Ankle dorsiflexion      Ankle plantarflexion 3+ 3+  Ankle inversion      Ankle eversion       (Blank rows = not tested)   LOWER EXTREMITY SPECIAL TESTS:  UTA due to pain and limited tolerance to testing positions    FUNCTIONAL TESTS:  30 seconds chair stand test 0 reps   GAIT: Distance walked: 61ft Assistive device utilized: Wheelchair (power) Level of assistance: Complete Independence Comments: Patient using power chair for mobility      PATIENT EDUCATION:  Education details: Discussed eval findings, rehab rationale and POC and patient is in agreement  Person educated:  Patient Education method: Explanation Education comprehension: verbalized  understanding and needs further education   HOME EXERCISE PROGRAM: Access Code: [email or web address] URL: [email or web address] Date: 01/20/2022 Prepared by: Sharlynn Oliphant   Exercises - Seated Long Arc Quad  - 5 x daily - 5 x weekly - 1 sets - 10 reps - Standing Heel Raise with Support  - 5 x daily - 5 x weekly - 1 sets - 10 reps  [facility] Adult PT Treatment:                                                DATE: 02/19/2022 Aquatic therapy at [address] - therapeutic pool temp 91 degrees in lap pool Pt enters building via W/C and son accompanying. Treatment took place in water 3.8 to 4 ft 8 in.feet deep depending upon activity.  Pt entered and exited the pool via stair and handrails.  Water walking for warm up forwards, and backwards and side stepping with blue DB  At edge of pool, pt performed LE exercise Heel raises - 2x20 Walking march with blue DB x2 laps Hamstring curl x20 Lt (Rt painful and ROM limited today) Hip circles CW/CCW x10 each BIL   Hip abd/add x20 BIL Runners Stretch x 30" BIL Hamstring stretch x 30" BIL Step ups on submerged step x10 fwd/lat BIL On submerged bench LAQ x1' Bicycle kicks x 1' Flutter kicks x 1' Scissor kicks x 1'  Pt requires the buoyancy of water for active assisted exercises with buoyancy supported for strengthening and AROM exercises. Hydrostatic pressure also supports joints by unweighting joint load by at least 50 % in 3-4 feet depth water. 80% in chest to neck deep water. Water will provide assistance with movement using the current and laminar flow while the buoyancy reduces weight bearing. Pt requires the viscosity of the water for resistance with strengthening exercises.   [facility] Adult PT Treatment:                                                DATE: 02-17-22 Aquatic therapy at [address] - therapeutic pool temp 91 degrees in lap pool Pt enters building via W/C and son accompanying. Treatment took place  in water 3.8 to  4 ft 8 in.feet deep depending upon activity.  Pt entered and exited the pool via stair and handrails   Pain at initiation of RX  6/10  RT knee and LT 4/10    At end  RT 7/10 and LT 4/10  Aquatic Therapy:  Water walking for warm up forwards, and backwards and side stepping Aquastretch for BiIL quadriceps and medial knees BIL At edge of pool, pt performed LE exercise: all exercises below with resistive swimming ankle fins Heel raises - 2x20 Walking march Hamstring curl x20 BIL  Hip circles CW/CCW x10 BIL   Hip abd/add x20 BIL Runners Stretch x 30" x 2 BIL Hamstring stretch x 30" x 2 BIL Step ups on submerged step x10 fwd/lat BIL Step downs with heel tap RT on step x 10, LT on step 2 x 5 Monster walks laterally across pool x 3 15 ft each     Pt requires the buoyancy of water for active assisted exercises  with buoyancy supported for strengthening and AROM exercises. Hydrostatic pressure also supports joints by unweighting joint load by at least 50 % in 3-4 feet depth water. 80% in chest to neck deep water. Water will provide assistance with movement using the current and laminar flow while the buoyancy reduces weight bearing. Pt requires the viscosity of the water for resistance with strengthening exercises.  [facility] Adult PT Treatment:                                                DATE: 02/12/2022 Aquatic therapy at [address] - therapeutic pool temp approximately 92 degrees. Pt enters building via W/C and son accompanying. Treatment took place in water 3.8 to  4 ft 8 in.feet deep depending upon activity.  Pt entered and exited the pool via stair and handrails with step to pattern.  Aquatic Therapy:  Water walking for warm up forwards, and backwards and side stepping holding blue DB Therapeutic Exercise: Step ups on submerged step x10 fwd/lat BIL Walking march Lateral walking with DB push downs Fwd walking with DB push downs At edge of pool, pt  performed LE exercise: Heel raises - 2x20 Hamstring curl x20 BIL  Hip circles CW/CCW x10 BIL   Hip abd/add x20 BIL   On submerged bench Bicycle kicks x 1' Reverse bicycle kicks x 1' Flutter kicks x 1' Scissor kicks x 1' Pt requires the buoyancy of water for active assisted exercises with buoyancy supported for strengthening and AROM exercises. Hydrostatic pressure also supports joints by unweighting joint load by at least 50 % in 3-4 feet depth water. 80% in chest to neck deep water. Water will provide assistance with movement using the current and laminar flow while the buoyancy reduces weight bearing. Pt requires the viscosity of the water for resistance with strengthening exercises.     ASSESSMENT:   CLINICAL IMPRESSION: Patient presents to aquatic PT session 20 minutes late, truncating session, reporting increased pain in her Lt knee. She has carpal tunnel surgery scheduled for next week and plans to pause her PT for now and call us after she has been cleared to continue PT for her knees. Session today focused on LE strengthening and general conditioning in the aquatic environment for use of buoyancy to offload joints and the viscosity of water as resistance during therapeutic exercise. Patient was able to tolerate all prescribed exercises in the aquatic environment with no adverse effects. Patient continues to benefit from skilled PT services on land and aquatic based and should be progressed as able to improve functional independence.    OBJECTIVE IMPAIRMENTS: Abnormal gait, decreased activity tolerance, decreased balance, decreased endurance, decreased knowledge of condition, decreased knowledge of use of DME, decreased mobility, difficulty walking, decreased ROM, decreased strength, obesity, and pain.    ACTIVITY LIMITATIONS: carrying, lifting, standing, squatting, stairs, and transfers   PERSONAL FACTORS: Age, Fitness, Past/current experiences, and Time since onset of  injury/illness/exacerbation are also affecting patient's functional outcome.    REHAB POTENTIAL: Fair based on chronicity, unsuccessful conservative interventions and advanced OA of B knees   CLINICAL DECISION MAKING: Stable/uncomplicated   EVALUATION COMPLEXITY: Low     GOALS: Goals reviewed with patient? No   SHORT TERM GOALS: Target date: 02/03/2022   Patient to demonstrate independence in HEP  Baseline:[email or web address] Goal status: Ongoing Pt reports non-compliance 02/12/22   2.  Initiate aquatic program Baseline: TBD Goal status: Met Initiated      LONG TERM GOALS: Target date: 02/17/2022     Increase BLE strength to 4-/5 in deficit areas Baseline:  MMT Right eval Left eval  Hip flexion 3 3  Hip extension      Hip abduction 3 3  Hip adduction      Hip internal rotation      Hip external rotation      Knee flexion 3+ 3+  Knee extension 3+ 3+  Ankle dorsiflexion      Ankle plantarflexion 3+ 3+    Goal status: INITIAL   2.  Increase FOTO score to 43 Baseline: 21 Goal status: INITIAL   3.  Increase B knee extension AROM to -15d B Baseline: -25d B Goal status: INITIAL       PLAN:   PT FREQUENCY: 1-2x/week   PT DURATION: 4 weeks   PLANNED INTERVENTIONS: Therapeutic exercises, Therapeutic activity, Neuromuscular re-education, Balance training, Gait training, Patient/Family education, Self Care, Joint mobilization, Stair training, DME instructions, and Re-evaluation   PLAN FOR NEXT SESSION: HEP review and update, ROM, strengthening, gait training and functional activities  Margarette Canada, [place] 02/19/22 4:59 PM  Voncille Lo, PT, [place] Certified Exercise Expert for the Aging Adult  04/06/22 8:26 AM Phone: [phone] Fax: [phone]

## 2022-02-18 NOTE — Progress Notes (Addendum)
Anesthesia note:  Bowel prep reminder:  NA  PCP - Dr. Deveron Furlong Cardiologist -none Other-   Chest x-ray - NA EKG - 02/18/22-chart Stress Test - no ECHO - no Cardiac Cath - no CABG-no Pacemaker/ICD device last checked:NA  Sleep Study - yes CPAP - no. Uses pillows   CBG at PAT visit-94 Fasting Blood Sugar at home-96-150 Checks Blood Sugar _Dexcom G6 Rt lower abd. DOS will be Rt abd____  Blood Thinner:no Blood Thinner Instructions: Aspirin Instructions: Last Dose:  Anesthesia review: Yes   reason:  Patient denies shortness of breath, fever, cough and chest pain at PAT appointment. Pt is SOB with any and most exertion. She uses an electric wheelchair and double cane to get around.She has Myasthenia gravis and RA. Asthma and sarcoidosis. She uses inhalers most days. BMI is 55.9.   Patient verbalized understanding of instructions that were given to them at the PAT appointment. Patient was also instructed that they will need to review over the PAT instructions again at home before surgery.yes

## 2022-02-19 ENCOUNTER — Ambulatory Visit: Payer: 59

## 2022-02-19 DIAGNOSIS — M25562 Pain in left knee: Secondary | ICD-10-CM | POA: Diagnosis not present

## 2022-02-19 DIAGNOSIS — M6281 Muscle weakness (generalized): Secondary | ICD-10-CM

## 2022-02-19 DIAGNOSIS — G8929 Other chronic pain: Secondary | ICD-10-CM

## 2022-02-19 DIAGNOSIS — M25561 Pain in right knee: Secondary | ICD-10-CM | POA: Diagnosis not present

## 2022-02-22 ENCOUNTER — Other Ambulatory Visit (HOSPITAL_COMMUNITY): Payer: Self-pay

## 2022-02-22 ENCOUNTER — Telehealth (INDEPENDENT_AMBULATORY_CARE_PROVIDER_SITE_OTHER): Payer: 59 | Admitting: Family Medicine

## 2022-02-22 ENCOUNTER — Telehealth: Payer: Self-pay

## 2022-02-22 ENCOUNTER — Telehealth: Payer: Self-pay | Admitting: Family Medicine

## 2022-02-22 DIAGNOSIS — M174 Other bilateral secondary osteoarthritis of knee: Secondary | ICD-10-CM

## 2022-02-22 DIAGNOSIS — E1165 Type 2 diabetes mellitus with hyperglycemia: Secondary | ICD-10-CM

## 2022-02-22 DIAGNOSIS — Z794 Long term (current) use of insulin: Secondary | ICD-10-CM

## 2022-02-22 MED ORDER — OXYCODONE HCL 5 MG PO TABS
ORAL_TABLET | ORAL | 0 refills | Status: DC
  Filled 2022-02-22: qty 30, 8d supply, fill #0

## 2022-02-22 NOTE — Progress Notes (Signed)
[place] Telemedicine Visit  Patient consented to have virtual visit and was identified by name and date of birth. Method of visit: Telephone  Encounter participants: Patient: [name] - located at home Provider: Ezequiel Essex - located at [facility]  Chief Complaint: elevated glucose   HPI: Only on Lantus 40 units daily Has carpal tunnel surgery coming up this week - was told to cut some home meds and she is concerned about her blood sugar leading up to surgery Last Darcel Bayley was last Sunday (surgeon told to skip 1/28) Last Jardiance was last night   Carpal tunnel surgery on 2/01  Surgeon wants glucose to be 80-180 for surgery Instructed to take 1/2 dose of her insulin on day of surgery Surgeon told her she can restart all medicines right after surgery  Glucose this AM was 145, has not yet taken 40 units of insulin  ROS: per HPI  Pertinent PMHx:  Patient Active Problem List   Diagnosis Date Noted   Drug-induced constipation 12/09/2021   Anxiety 09/23/2021   Chronic right shoulder pain 03/24/2021   Left wrist pain 03/24/2021   Diabetic neuropathy ([place]) 02/25/2021   Healthcare maintenance 10/21/2020   Hyperlipidemia (primary prevention, LDL goal <70) 09/04/2020   Wheelchair dependence 05/05/2020   Carpal tunnel syndrome of right wrist, RECURRENT 10/15/2019   Insomnia, idiopathic 09/30/2018   Grade I diastolic dysfunction [id number]   Encounter for chronic pain management 02/11/2014   Diabetes ([place]) 02/11/2014   Cystocele 09/14/2012   Chronic pain of both knees 05/28/2010   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   Allergic rhinitis 05/30/2008   Carpal tunnel syndrome of left wrist 01/17/2008   OBESITY HYPOVENTILATION SYNDROME 12/27/2007   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  Respiratory: speaking clearly in full sentences, no distress  Assessment/Plan:  Today to take 44 units Lantus   Check glucose tomorrow: - if 80-180, keep the insulin at the 44 units dose -  if above 180, increase by 4 units to 148 units  Check glucose again the next day: - if 80-180, keep the insulin at the same  - if above 180, increase by 4 units   Return to normal regimen after surgery.   Time spent during visit with patient: 11 minutes  Ezequiel Essex, MD  PGY-3 [facility]

## 2022-02-22 NOTE — Patient Instructions (Signed)
It was wonderful to see you today. Thank you for allowing me to be a part of your care. Below is a short summary of what we discussed at your visit today:  Blood sugar control before surgery Today to take 44 units  Check glucose tomorrow: - if 80-180, keep the insulin at the 44 units dose -  if above 180, increase by 4 units to 148 units  Check glucose again the next day: - if 80-180, keep the insulin at the same  - if above 180, increase by 4 units  Keep going like this every day.   Call if glucose consistently over 200  Return to your normal regimen after surgery!   If you have any questions or concerns, please do not hesitate to contact us via phone or MyChart message.   Ezequiel Essex, MD

## 2022-02-22 NOTE — Telephone Encounter (Addendum)
I will send it in this time, but this needs to be corrected with the patient's insurance - it is not sustainable to have preceptors prescribe all narcotics on behalf of resident physicians.  RN team, can you investigate?  Leeanne Rio, MD

## 2022-02-22 NOTE — Telephone Encounter (Signed)
Rec'd PA request for patients Oxycodone 5mg .  Medication covered, however insurance is saying prescriber is not authorized for drugs DEA class. May need to be sent in from a different provider.

## 2022-02-22 NOTE — Addendum Note (Signed)
Addended by: Leeanne Rio on: 02/22/2022 05:25 PM   Modules accepted: Orders

## 2022-02-22 NOTE — Telephone Encounter (Signed)
Note in error. Please see virtual visit note 1/29. Ezequiel Essex, MD

## 2022-02-23 ENCOUNTER — Other Ambulatory Visit (HOSPITAL_COMMUNITY): Payer: Self-pay

## 2022-02-23 ENCOUNTER — Other Ambulatory Visit: Payer: Self-pay

## 2022-02-23 NOTE — Telephone Encounter (Signed)
Camille- Do you know anything about this? I tried to contact [employer] and they were not really able to give me any additional information.  Talbot Grumbling, RN

## 2022-02-23 NOTE — Telephone Encounter (Signed)
Rosendo Gros- Which insurance plan was giving this message?  It looks like she has Medicare and Medicaid.   Talbot Grumbling, RN

## 2022-02-25 ENCOUNTER — Other Ambulatory Visit: Payer: Self-pay

## 2022-02-25 ENCOUNTER — Ambulatory Visit (HOSPITAL_BASED_OUTPATIENT_CLINIC_OR_DEPARTMENT_OTHER): Payer: 59 | Admitting: Anesthesiology

## 2022-02-25 ENCOUNTER — Ambulatory Visit (HOSPITAL_COMMUNITY)
Admission: RE | Admit: 2022-02-25 | Discharge: 2022-02-25 | Disposition: A | Discharge status: Home / Self Care | Payer: 59 | Source: Ambulatory Visit | Attending: Orthopedic Surgery | Admitting: Orthopedic Surgery

## 2022-02-25 ENCOUNTER — Encounter (HOSPITAL_COMMUNITY): Payer: Self-pay | Admitting: Orthopedic Surgery

## 2022-02-25 ENCOUNTER — Encounter (HOSPITAL_COMMUNITY)
Admission: RE | Disposition: A | Discharge status: Home / Self Care | Payer: Self-pay | Source: Ambulatory Visit | Attending: Orthopedic Surgery

## 2022-02-25 ENCOUNTER — Other Ambulatory Visit (HOSPITAL_COMMUNITY): Payer: Self-pay

## 2022-02-25 ENCOUNTER — Ambulatory Visit (HOSPITAL_COMMUNITY): Payer: 59 | Admitting: Physician Assistant

## 2022-02-25 DIAGNOSIS — F418 Other specified anxiety disorders: Secondary | ICD-10-CM

## 2022-02-25 DIAGNOSIS — G5602 Carpal tunnel syndrome, left upper limb: Secondary | ICD-10-CM | POA: Insufficient documentation

## 2022-02-25 DIAGNOSIS — E119 Type 2 diabetes mellitus without complications: Secondary | ICD-10-CM | POA: Diagnosis not present

## 2022-02-25 DIAGNOSIS — G4730 Sleep apnea, unspecified: Secondary | ICD-10-CM | POA: Diagnosis not present

## 2022-02-25 DIAGNOSIS — K219 Gastro-esophageal reflux disease without esophagitis: Secondary | ICD-10-CM | POA: Diagnosis not present

## 2022-02-25 DIAGNOSIS — I10 Essential (primary) hypertension: Secondary | ICD-10-CM | POA: Diagnosis not present

## 2022-02-25 DIAGNOSIS — G7000 Myasthenia gravis without (acute) exacerbation: Secondary | ICD-10-CM | POA: Diagnosis not present

## 2022-02-25 DIAGNOSIS — M174 Other bilateral secondary osteoarthritis of knee: Secondary | ICD-10-CM

## 2022-02-25 DIAGNOSIS — Z7984 Long term (current) use of oral hypoglycemic drugs: Secondary | ICD-10-CM

## 2022-02-25 DIAGNOSIS — E669 Obesity, unspecified: Secondary | ICD-10-CM

## 2022-02-25 DIAGNOSIS — Z6841 Body Mass Index (BMI) 40.0 and over, adult: Secondary | ICD-10-CM

## 2022-02-25 DIAGNOSIS — J45909 Unspecified asthma, uncomplicated: Secondary | ICD-10-CM | POA: Diagnosis not present

## 2022-02-25 DIAGNOSIS — E1165 Type 2 diabetes mellitus with hyperglycemia: Secondary | ICD-10-CM

## 2022-02-25 HISTORY — PX: CARPAL TUNNEL RELEASE: SHX101

## 2022-02-25 LAB — GLUCOSE, CAPILLARY
Glucose-Capillary: 145 mg/dL — ABNORMAL HIGH (ref 70–99)
Glucose-Capillary: 154 mg/dL — ABNORMAL HIGH (ref 70–99)

## 2022-02-25 SURGERY — CARPAL TUNNEL RELEASE
Anesthesia: General | Laterality: Left

## 2022-02-25 MED ORDER — FENTANYL CITRATE (PF) 100 MCG/2ML IJ SOLN
INTRAMUSCULAR | Status: DC | PRN
  Administered 2022-02-25 (×2): 50 ug via INTRAVENOUS

## 2022-02-25 MED ORDER — MEPERIDINE HCL 50 MG/ML IJ SOLN: 6.2500 mg | INTRAMUSCULAR | Status: DC | PRN

## 2022-02-25 MED ORDER — LIDOCAINE HCL (CARDIAC) PF 100 MG/5ML IV SOSY
PREFILLED_SYRINGE | INTRAVENOUS | Status: DC | PRN
  Administered 2022-02-25: 60 mg via INTRAVENOUS

## 2022-02-25 MED ORDER — LIDOCAINE HCL 2 % IJ SOLN
INTRAMUSCULAR | Status: AC
  Filled 2022-02-25: qty 20

## 2022-02-25 MED ORDER — MIDAZOLAM HCL 5 MG/5ML IJ SOLN
INTRAMUSCULAR | Status: DC | PRN
  Administered 2022-02-25: 2 mg via INTRAVENOUS

## 2022-02-25 MED ORDER — BUPIVACAINE HCL (PF) 0.5 % IJ SOLN
INTRAMUSCULAR | Status: AC
  Filled 2022-02-25: qty 30

## 2022-02-25 MED ORDER — ORAL CARE MOUTH RINSE: 15.0000 mL | Freq: Once | OROMUCOSAL | Status: AC

## 2022-02-25 MED ORDER — LIDOCAINE HCL 2 % IJ SOLN
INTRAMUSCULAR | Status: DC | PRN
  Administered 2022-02-25: 3 mL

## 2022-02-25 MED ORDER — ACETAMINOPHEN 325 MG PO TABS
ORAL_TABLET | ORAL | Status: AC
  Filled 2022-02-25: qty 2

## 2022-02-25 MED ORDER — CEFAZOLIN IN SODIUM CHLORIDE 3-0.9 GM/100ML-% IV SOLN
3.0000 g | INTRAVENOUS | Status: AC
  Administered 2022-02-25: 3 g via INTRAVENOUS
  Filled 2022-02-25: qty 100

## 2022-02-25 MED ORDER — PROPOFOL 10 MG/ML IV BOLUS
INTRAVENOUS | Status: DC | PRN
  Administered 2022-02-25: 200 mg via INTRAVENOUS

## 2022-02-25 MED ORDER — FENTANYL CITRATE PF 50 MCG/ML IJ SOSY
25.0000 ug | PREFILLED_SYRINGE | INTRAMUSCULAR | Status: DC | PRN
  Administered 2022-02-25 (×2): 50 ug via INTRAVENOUS

## 2022-02-25 MED ORDER — CHLORHEXIDINE GLUCONATE 0.12 % MT SOLN
15.0000 mL | Freq: Once | OROMUCOSAL | Status: AC
  Administered 2022-02-25: 15 mL via OROMUCOSAL

## 2022-02-25 MED ORDER — FENTANYL CITRATE PF 50 MCG/ML IJ SOSY
PREFILLED_SYRINGE | INTRAMUSCULAR | Status: AC
  Filled 2022-02-25: qty 1

## 2022-02-25 MED ORDER — OXYCODONE HCL 5 MG PO TABS: 5.0000 mg | ORAL_TABLET | Freq: Once | ORAL | Status: DC | PRN

## 2022-02-25 MED ORDER — DEXAMETHASONE SODIUM PHOSPHATE 4 MG/ML IJ SOLN
INTRAMUSCULAR | Status: DC | PRN
  Administered 2022-02-25: 5 mg via INTRAVENOUS

## 2022-02-25 MED ORDER — HYDROCODONE-ACETAMINOPHEN 7.5-325 MG PO TABS
1.0000 | ORAL_TABLET | ORAL | Status: DC | PRN
  Administered 2022-02-25: 1 via ORAL

## 2022-02-25 MED ORDER — ACETAMINOPHEN 160 MG/5ML PO SOLN: 325.0000 mg | ORAL | Status: DC | PRN

## 2022-02-25 MED ORDER — LACTATED RINGERS IV SOLN: INTRAVENOUS | Status: DC

## 2022-02-25 MED ORDER — HYDROCODONE-ACETAMINOPHEN 7.5-325 MG PO TABS
ORAL_TABLET | ORAL | Status: AC
  Filled 2022-02-25: qty 1

## 2022-02-25 MED ORDER — FENTANYL CITRATE (PF) 100 MCG/2ML IJ SOLN
INTRAMUSCULAR | Status: AC
  Filled 2022-02-25: qty 2

## 2022-02-25 MED ORDER — CEFAZOLIN SODIUM 1 G IJ SOLR
INTRAMUSCULAR | Status: AC
  Filled 2022-02-25: qty 10

## 2022-02-25 MED ORDER — BUPIVACAINE HCL (PF) 0.5 % IJ SOLN
INTRAMUSCULAR | Status: DC | PRN
  Administered 2022-02-25: 3 mL

## 2022-02-25 MED ORDER — ACETAMINOPHEN 325 MG PO TABS
325.0000 mg | ORAL_TABLET | ORAL | Status: DC | PRN
  Administered 2022-02-25: 650 mg via ORAL

## 2022-02-25 MED ORDER — ONDANSETRON HCL 4 MG/2ML IJ SOLN: 4.0000 mg | Freq: Once | INTRAMUSCULAR | Status: DC | PRN

## 2022-02-25 MED ORDER — ONDANSETRON HCL 4 MG/2ML IJ SOLN
INTRAMUSCULAR | Status: DC | PRN
  Administered 2022-02-25: 4 mg via INTRAVENOUS

## 2022-02-25 MED ORDER — OXYCODONE HCL 5 MG PO TABS
5.0000 mg | ORAL_TABLET | Freq: Four times a day (QID) | ORAL | 0 refills | Status: DC | PRN
  Filled 2022-02-25 – 2022-03-19 (×2): qty 30, 8d supply, fill #0

## 2022-02-25 MED ORDER — OXYCODONE HCL 5 MG/5ML PO SOLN: 5.0000 mg | Freq: Once | ORAL | Status: DC | PRN

## 2022-02-25 MED ORDER — MIDAZOLAM HCL 2 MG/2ML IJ SOLN
INTRAMUSCULAR | Status: AC
  Filled 2022-02-25: qty 2

## 2022-02-25 MED ORDER — PROPOFOL 10 MG/ML IV BOLUS
INTRAVENOUS | Status: AC
  Filled 2022-02-25: qty 20

## 2022-02-25 SURGICAL SUPPLY — 42 items
APL PRP STRL LF DISP 70% ISPRP (MISCELLANEOUS) ×1
BAG COUNTER SPONGE SURGICOUNT (BAG) IMPLANT
BAG SPNG CNTER NS LX DISP (BAG)
BLADE SURG 15 STRL LF DISP TIS (BLADE) ×2 IMPLANT
BLADE SURG 15 STRL SS (BLADE) ×1
BNDG CMPR 75X21 PLY HI ABS (MISCELLANEOUS) ×1
BNDG CMPR 9X4 STRL LF SNTH (GAUZE/BANDAGES/DRESSINGS)
BNDG COHESIVE 1X5 TAN STRL LF (GAUZE/BANDAGES/DRESSINGS) ×2 IMPLANT
BNDG ESMARK 4X9 LF (GAUZE/BANDAGES/DRESSINGS) IMPLANT
BNDG GZE 12X3 1 PLY HI ABS (GAUZE/BANDAGES/DRESSINGS) ×1
BNDG STRETCH GAUZE 3IN X12FT (GAUZE/BANDAGES/DRESSINGS) IMPLANT
CHLORAPREP W/TINT 26 (MISCELLANEOUS) ×2 IMPLANT
CORD BIPOLAR FORCEPS 12FT (ELECTRODE) ×2 IMPLANT
COVER BACK TABLE 60X90IN (DRAPES) ×2 IMPLANT
DRAPE EXTREMITY T 121X128X90 (DISPOSABLE) ×2 IMPLANT
DRAPE IMP U-DRAPE 54X76 (DRAPES) ×2 IMPLANT
DRAPE SURG 17X23 STRL (DRAPES) ×2 IMPLANT
DRSG EMULSION OIL 3X3 NADH (GAUZE/BANDAGES/DRESSINGS) ×2 IMPLANT
GAUZE SPONGE 4X4 12PLY STRL (GAUZE/BANDAGES/DRESSINGS) ×2 IMPLANT
GAUZE STRETCH 2X75IN STRL (MISCELLANEOUS) ×2 IMPLANT
GLOVE BIO SURGEON STRL SZ7 (GLOVE) ×2 IMPLANT
GLOVE BIO SURGEON STRL SZ7.5 (GLOVE) ×2 IMPLANT
GLOVE BIOGEL PI IND STRL 6.5 (GLOVE) ×2 IMPLANT
GLOVE BIOGEL PI IND STRL 7.0 (GLOVE) ×2 IMPLANT
GLOVE BIOGEL PI IND STRL 8 (GLOVE) ×2 IMPLANT
GLOVE SURG POLYISO LF SZ6.5 (GLOVE) ×2 IMPLANT
GOWN STRL REUS W/ TWL LRG LVL3 (GOWN DISPOSABLE) ×4 IMPLANT
GOWN STRL REUS W/ TWL XL LVL3 (GOWN DISPOSABLE) ×4 IMPLANT
GOWN STRL REUS W/TWL LRG LVL3 (GOWN DISPOSABLE) ×2
GOWN STRL REUS W/TWL XL LVL3 (GOWN DISPOSABLE) ×2
KIT BASIN OR (CUSTOM PROCEDURE TRAY) ×2 IMPLANT
NDL HYPO 25X1 1.5 SAFETY (NEEDLE) IMPLANT
NEEDLE HYPO 25X1 1.5 SAFETY (NEEDLE) IMPLANT
NS IRRIG 1000ML POUR BTL (IV SOLUTION) ×2 IMPLANT
PADDING CAST ABS COTTON 4X4 ST (CAST SUPPLIES) ×2 IMPLANT
SPIKE FLUID TRANSFER (MISCELLANEOUS) IMPLANT
STOCKINETTE 4X48 STRL (DRAPES) ×2 IMPLANT
SUT ETHILON 4 0 PS 2 18 (SUTURE) ×2 IMPLANT
SYR BULB EAR ULCER 3OZ GRN STR (SYRINGE) ×2 IMPLANT
SYR CONTROL 10ML LL (SYRINGE) IMPLANT
TOWEL OR 17X26 10 PK STRL BLUE (TOWEL DISPOSABLE) ×2 IMPLANT
UNDERPAD 30X36 HEAVY ABSORB (UNDERPADS AND DIAPERS) ×2 IMPLANT

## 2022-02-25 NOTE — Progress Notes (Signed)
MDA notified patients BP 183/94 HR 96 Patient was treated preoperatively for elevated BP and MDA doesn't want to further treat.  Patient takes BP medications at home at night.  CBG is 154, again patient takes 40 Lantus at night and no new orders received.

## 2022-02-25 NOTE — Discharge Instructions (Addendum)
Discharge Instructions after Carpal Tunnel Release   You will have a light dressing on your hand.  You may begin gentle motion of your fingers and hand immediately, but you should not do any heavy lifting or gripping.  Elevate your hand for the first 48 hours after surgery. Pain medicine has been prescribed for you.  Use your medicine as needed over the first 48 hours, and then you can begin to taper your use. You may take Extra Strength Tylenol or Tylenol only in place of the pain pills.  Leave the dressing in place until the third day after your surgery and then remove it and place a band-aid over the stitches.  After the bandage has been removed you may shower, but do not soak the incision.  You may drive a car when you are off of prescription pain medications and can safely control your vehicle with both hands.   Please call [phone] during normal business hours or [phone] after hours for any problems. Including the following:  - excessive redness of the incisions - drainage for more than 4 days - fever of more than 101.5 F  *Please note that pain medications will not be refilled after hours or on weekends.

## 2022-02-25 NOTE — Anesthesia Procedure Notes (Signed)
Procedure Name: LMA Insertion Date/Time: 02/25/2022 12:56 PM  Performed by: Randye Lobo, CRNAPre-anesthesia Checklist: Patient identified, Emergency Drugs available, Suction available and Patient being monitored Patient Re-evaluated:Patient Re-evaluated prior to induction Oxygen Delivery Method: Circle System Utilized Preoxygenation: Pre-oxygenation with 100% oxygen Induction Type: IV induction Ventilation: Mask ventilation without difficulty LMA: LMA inserted LMA Size: 4.0 Number of attempts: 1 Airway Equipment and Method: Bite block Placement Confirmation: positive ETCO2 Tube secured with: Tape Dental Injury: Teeth and Oropharynx as per pre-operative assessment

## 2022-02-25 NOTE — Anesthesia Preprocedure Evaluation (Addendum)
Anesthesia Evaluation  Patient identified by MRN, date of birth, ID band Patient awake    Reviewed: Allergy & Precautions, NPO status , Patient's Chart, lab work & pertinent test results  Airway Mallampati: II  TM Distance: >3 FB Neck ROM: Full    Dental  (+) Missing,    Pulmonary asthma , sleep apnea    breath sounds clear to auscultation       Cardiovascular hypertension,  Rhythm:Regular Rate:Normal  ECHO 15'  - Left ventricle: The cavity size was normal. Systolic function was    normal. The estimated ejection fraction was in the range of 60%    to 65%. Wall motion was normal; there were no regional wall    motion abnormalities.  - Tricuspid valve: There was trivial regurgitation.      Neuro/Psych  Headaches PSYCHIATRIC DISORDERS Anxiety Depression     Neuromuscular disease    GI/Hepatic PUD,GERD  Medicated,,  Endo/Other  diabetes, Type 2    Renal/GU      Musculoskeletal  (+) Arthritis , Osteoarthritis,    Abdominal  (+) + obese  Peds  Hematology negative hematology ROS (+)   Anesthesia Other Findings - Myasthenia Gravis  Reproductive/Obstetrics                             Anesthesia Physical Anesthesia Plan  ASA: 4  Anesthesia Plan: General   Post-op Pain Management: Minimal or no pain anticipated and Tylenol PO (pre-op)*   Induction: Intravenous  PONV Risk Score and Plan: 4 or greater and Ondansetron, Midazolam, Treatment may vary due to age or medical condition, Scopolamine patch - Pre-op and Dexamethasone  Airway Management Planned: LMA  Additional Equipment: None  Intra-op Plan:   Post-operative Plan: Extubation in OR  Informed Consent: I have reviewed the patients History and Physical, chart, labs and discussed the procedure including the risks, benefits and alternatives for the proposed anesthesia with the patient or authorized representative who has indicated  his/her understanding and acceptance.       Plan Discussed with: CRNA and Anesthesiologist  Anesthesia Plan Comments: (See APP note by Durel Salts, FNP )        Anesthesia Quick Evaluation

## 2022-02-25 NOTE — H&P (Signed)
[name] is an 57 y.o. female.   Chief Complaint: Left carpal tunnel syndrome HPI: Left carpal tunnel syndrome, failed conservative management.  Past Medical History:  Diagnosis Date   ALLERGIC RHINITIS    takes Zyrtec daily   Anxiety    Arthritis    Asthma    Albuterol prn;Symbicort daily and Singulair daily   Bronchitis    hx of   CAP (community acquired pneumonia) 02/20/2015   Carpal tunnel syndrome    right   Chronic back pain    Constipation    Miralax prn   Depression    takes Cymbalta daily   Diabetes ([place])    type 2    Eczema    Gallstones 2010   GERD (gastroesophageal reflux disease)    takes Omeprazole daily   Hemorrhoids    Hypertension    takes Prinizide daily and Clonidine on Mondays   Hypoventilation associated with obesity syndrome (HCC)    Insomnia    Irritable bladder    Joint pain    Morbid (severe) obesity due to excess calories ([place]) 04/10/2007   Restrictive changes on pfts 11/30/2007     Myasthenia gravis 1994   Positive Acetylcholine receptor Ab and single fiber EMG Dr. Barbaraann Share [facility]- Dr. 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 Chesley Mires- 10/30/2007 ANA positive, ANA titer  negative, RF less than 20   Rheumatoid arthritis(714.0)    Sarcoidosis 12/23/2010   Derm  dx NCG  12/03/10 (path report in EPIC)    Overview:  Overview:  Diagnosed on skin biopsy November 2012  Last Assessment & Plan:  Labs are unremarkable. CXR improved/stable. PFT wnl. I am happy patient had these tests and we have a baseline for her. At this time, I do not see an indication for  starting chronic steroids daily. Patient agrees. Her skin lesions are bothering her the most and syst   Shortness of breath    can be sitting as well as exertion   Skin irritation    skin itchy   Trigger finger    Urinary frequency    takes Ditropan daily   Viral meningitis    history of viral meningitis    Past Surgical History:  Procedure Laterality Date   BRONCHOSCOPY  08/2007   CARPAL TUNNEL RELEASE  05/20/2011   Procedure: CARPAL TUNNEL RELEASE;  Surgeon: Nita Sells, MD;  Location: [facility];  Service: Orthopedics;  Laterality: Right;   CARPAL TUNNEL RELEASE Right 11/22/2019   Procedure: RIGHT HAND CARPAL TUNNEL RELEASE;  Surgeon: Tania Ade, MD;  Location: [facility];  Service: Orthopedics;  Laterality: Right;   CESAREAN SECTION  09/15/2004   COLONOSCOPY N/A 01/21/2014   Procedure: COLONOSCOPY;  Surgeon: Gatha Mayer, MD;  Location: [facility];  Service: Endoscopy;  Laterality: N/A;   cortisone injection     receives an injection every 41months   ESOPHAGOGASTRODUODENOSCOPY     ESOPHAGOGASTRODUODENOSCOPY N/A 01/21/2014   Procedure: ESOPHAGOGASTRODUODENOSCOPY (EGD);  Surgeon: Gatha Mayer, MD;  Location: [facility];  Service: Endoscopy;  Laterality:  N/A;   LAPAROSCOPIC CHOLECYSTECTOMY  07/2008   by Dr. Neldon Mc   Thymus resection  08/25/1993   TRIGGER FINGER RELEASE  05/20/2011   Procedure: RELEASE TRIGGER FINGER/A-1 PULLEY;  Surgeon: Nita Sells, MD;  Location: [facility];  Service: Orthopedics;  Laterality: Right;  RIGHT TRIGGER THUMB RELEASE AND RIGHT CARPAL TUNNEL RELEASE    Family History  Problem Relation Age of Onset   Hypertension Father    Sickle cell trait Father    Diabetes Father        Borderline   Stroke Paternal Grandmother    Sickle cell trait Paternal Grandfather    Diabetes Paternal Grandfather    Lupus Other        Mother's first cousin   Crohn's disease Paternal Aunt    Diabetes Maternal Grandmother    Anesthesia  problems Neg Hx    Hypotension Neg Hx    Malignant hyperthermia Neg Hx    Pseudochol deficiency Neg Hx    Colon cancer Neg Hx    Colon polyps Neg Hx    Heart disease Neg Hx    Kidney disease Neg Hx    Esophageal cancer Neg Hx    Gallbladder disease Neg Hx    Social History:  reports that she has never smoked. She has never used smokeless tobacco. She reports that she does not drink alcohol and does not use drugs.  Allergies:  Allergies  Allergen Reactions   Apple Juice Other (See Comments)    "mouth itch" - lumps on tongue  Pt allergic to raw apples, can tolerate cooked apples   Banana Other (See Comments)    "whelps on tongue"   Metformin And Related Nausea Only   Shrimp [Shellfish Allergy] Nausea And Vomiting and Swelling    Medications Prior to Admission  Medication Sig Dispense Refill   acetaminophen (TYLENOL) 500 MG tablet Take 1 tablet (500 mg total) by mouth every 6 (six) hours as needed. 100 tablet 3   albuterol (PROAIR HFA) 108 (90 Base) MCG/ACT inhaler Inhale 2 puffs into the lungs every 4 (four) hours as needed. 6.7 g 12   amLODipine (NORVASC) 5 MG tablet TAKE 1 TABLET BY MOUTH EVERYDAY AT BEDTIME 90 tablet 3   atorvastatin (LIPITOR) 40 MG tablet Take 2 tablets (80 mg total) by mouth daily. 180 tablet 3   budesonide-formoterol (SYMBICORT) 160-4.5 MCG/ACT inhaler Inhale 2 puffs into the lungs 2 (two) times daily. (Patient taking differently: Inhale 2 puffs into the lungs 2 (two) times daily as needed (shortness of breath).) 10.2 g 8   buPROPion (WELLBUTRIN XL) 300 MG 24 hr tablet Take 1 tablet (300 mg total) by mouth daily. 90 tablet 3   cetirizine (ZYRTEC) 10 MG tablet Take 1 tablet (10 mg total) by mouth daily. 90 tablet 3   diclofenac (VOLTAREN) 50 MG EC tablet Take 1 tablet (50 mg total) by mouth 2 (two) times daily as needed. 60 tablet 0   empagliflozin (JARDIANCE) 10 MG TABS tablet Take 1 tablet by mouth daily. 90 tablet 3   furosemide (LASIX) 20 MG tablet Take 1  tablet (20 mg total) by mouth daily as needed. 30 tablet 2   hydrochlorothiazide (HYDRODIURIL) 25 MG tablet Take 1 tablet (25 mg total) by mouth daily. 90 tablet 3   hydrOXYzine (ATARAX) 50 MG tablet Take 1 - 2 tablets (50 - 100 mg total) by mouth 3 times daily as needed for anxiety. 30 tablet 2   insulin glargine (LANTUS SOLOSTAR) 100 UNIT/ML  Solostar Pen Inject 40 Units into the skin daily. 15 mL 3   irbesartan (AVAPRO) 150 MG tablet Take 1 tablet (150 mg total) by mouth at bedtime. 90 tablet 3   montelukast (SINGULAIR) 10 MG tablet Take 1 tablet (10 mg total) by mouth at bedtime. 90 tablet 3   omeprazole (PRILOSEC) 40 MG capsule Take 1 capsule (40 mg total) by mouth daily. 90 capsule 3   oxybutynin (DITROPAN) 5 MG tablet Take 1 tablet (5 mg total) by mouth 2 (two) times daily. (Patient taking differently: Take 10 mg by mouth daily.) 60 tablet 1   oxyCODONE (OXY IR/ROXICODONE) 5 MG immediate release tablet Take 1 tablet by mouth every 6 hours as needed (take only if pain not relieved by daily lyrica and diclofenac tablet). 30 tablet 0   pregabalin (LYRICA) 75 MG capsule Take 1 capsule (75 mg total) by mouth 2 (two) times daily. Back down to once daily if you become drowsy. (Patient taking differently: Take 75 mg by mouth daily.) 60 capsule 2   tirzepatide (MOUNJARO) 7.5 MG/0.5ML Pen Inject 7.5 mg into the skin once a week. 2 mL 1   glucose blood (ACCU-CHEK GUIDE) test strip Use to check blood sugar in the morning, at noon and at bedtime as directed 100 each 6   Blood Glucose Monitoring Suppl (ACCU-CHEK GUIDE) w/Device KIT Please use to check blood sugar up to four times daily. (Patient not taking: Reported on 09/23/2021) 1 kit 0   Continuous Blood Gluc Receiver (DEXCOM G6 RECEIVER) DEVI Use as directed 1 each 24   Continuous Blood Gluc Sensor (DEXCOM G6 SENSOR) MISC Use as directed and replace every 10 days 3 each 11   Continuous Blood Gluc Transmit (DEXCOM G6 TRANSMITTER) MISC Use as directed and  replace every 90 days 1 each 3   Insulin Pen Needle 31G X 5 MM MISC Use to inject Lantus Solostar 100 each 9    Results for orders placed or performed during the hospital encounter of 02/25/22 (from the past 48 hour(s))  Glucose, capillary     Status: Abnormal   Collection Time: 02/25/22 10:38 AM  Result Value Ref Range   Glucose-Capillary 145 (H) 70 - 99 mg/dL    Comment: Glucose reference range applies only to samples taken after fasting for at least 8 hours.   No results found.  Review of Systems  All other systems reviewed and are negative.   Blood pressure (!) 193/108, pulse (!) 104, temperature 98.9 F (37.2 C), temperature source Oral, resp. rate 18, height 5\' 3"  (1.6 m), weight (!) 143.3 kg, SpO2 99 %. Physical Exam HENT:     Head: Atraumatic.  Eyes:     Extraocular Movements: Extraocular movements intact.  Cardiovascular:     Pulses: Normal pulses.  Pulmonary:     Effort: Pulmonary effort is normal.  Musculoskeletal:     Comments: L hand decr sensation in the median nerve distribution  Skin:    General: Skin is warm.  Neurological:     Mental Status: She is alert.  Psychiatric:        Mood and Affect: Mood normal.      Assessment/Plan Left carpal tunnel syndrome, failed conservative management. Plan L carpal tunnel release Risks / benefits of surgery discussed Consent on chart  NPO for OR Preop antibiotics    [name], MD 02/25/2022, 11:59 AM

## 2022-02-25 NOTE — Anesthesia Postprocedure Evaluation (Signed)
Anesthesia Post Note  Patient: [name]  Procedure(s) Performed: CARPAL TUNNEL RELEASE (Left)     Patient location during evaluation: PACU Anesthesia Type: General Level of consciousness: awake and alert Pain management: pain level controlled Vital Signs Assessment: post-procedure vital signs reviewed and stable Respiratory status: spontaneous breathing, nonlabored ventilation, respiratory function stable and patient connected to nasal cannula oxygen Cardiovascular status: blood pressure returned to baseline and stable Postop Assessment: no apparent nausea or vomiting Anesthetic complications: no   No notable events documented.  Last Vitals:  Vitals:   02/25/22 1415 02/25/22 1445  BP: (!) 183/98 97/76  Pulse: 89   Resp: 18   Temp: 36.6 C   SpO2: 100%     Last Pain:  Vitals:   02/25/22 1443  TempSrc:   PainSc: 6                  Mikyle Sox

## 2022-02-25 NOTE — Transfer of Care (Signed)
Immediate Anesthesia Transfer of Care Note  Patient: Valory A Cullom  Procedure(s) Performed: CARPAL TUNNEL RELEASE (Left)  Patient Location: PACU  Anesthesia Type:General  Level of Consciousness: awake, alert , oriented, patient cooperative, and responds to stimulation  Airway & Oxygen Therapy: Patient Spontanous Breathing and Patient connected to face mask oxygen  Post-op Assessment: Report given to RN, Post -op Vital signs reviewed and stable, and Patient moving all extremities  Post vital signs: Reviewed and stable  Last Vitals:  Vitals Value Taken Time  BP 168/89 02/25/22 1340  Temp    Pulse 92 02/25/22 1342  Resp 13 02/25/22 1342  SpO2 100 % 02/25/22 1342  Vitals shown include unvalidated device data.  Last Pain:  Vitals:   02/25/22 1036  TempSrc: Oral         Complications: No notable events documented.

## 2022-02-25 NOTE — Op Note (Signed)
Procedure(s): CARPAL TUNNEL RELEASE Procedure Note  [name] female 57 y.o. 02/25/2022  Preoperative diagnosis: Left carpal tunnel syndrome  Postoperative diagnosis: Same  Procedure(s) and Anesthesia Type:    * CARPAL TUNNEL RELEASE - Choice  Surgeon(s) and Role:    Tania Ade, MD - Primary   Indications:  57 y.o. female with left carpal tunnel syndrome. The patient has findings of severe carpal tunnel syndrome and has failed nonoperative tx     Surgeon: [name]   Assistants: Sheryle Hail PA-C [name] was present and scrubbed throughout the procedure and was essential in positioning, retraction, exposure, and closure)  Anesthesia: General LMA anesthesia    Procedure Detail  CARPAL TUNNEL RELEASE  Findings: Complete release of the transcarpal ligament which was thickened centrally.  Estimated Blood Loss:  Minimal         Drains: none  Blood Given: none         Specimens: none        Complications:  * No complications entered in OR log *  Tourniquet time: Less than 10 min at 250 mmHg         Disposition: PACU - hemodynamically stable.         Condition: stable    Procedure: The patient was brought to the operating room and was placed supine on the operative table.  LMA anesthesia was used.  A nonsterile tourniquet was applied and the operative extremity was prepped and draped in the standard sterile fashion.  The limb was exsanguinated using an Esmarch dressing and the tourniquet was elevated to 250 mm mercury.  A 3 cm incision was made in line with the web space between the third and fourth ray extending distally from the flexion crease of the wrist. Dissection was carried down through subcutaneous fat to the palmar fascia which was opened longitudinally in line with the incision. Underlying muscle was swept off the transcarpal ligament and a small rent was made in the ligament with a 15 blade. A Freer elevator was then inserted  proximally and distally to protect the contents of the carpal canal while a 15 blade was used under direct visualization to open proximally and distally. The proximal and distal extents of the transcarpal ligament were then carefully exposed and under direct visualization completely divided using tenotomy scissors. Great care was taken to protect the underlying nerve and distally the palmar arch. The transcarpal ligament was noted to be thickened centrally.  At the conclusion of the procedure the free edges of the transcarpal ligament or widely separated.  The wound was copiously irrigated with normal saline and subsequently closed in one layer with 4-0 nylon in an interrupted fashion.  Sterile dressings were applied including Adaptic 4 x 4's Kling dressing and a lightly wrapped Coban dressing. The tourniquet was let down. The fingers were pink and warm.  The patient was then awakened and taken to the recovery room in stable condition.  Postoperative plan: Patient will be discharged home today and will followup in 10 days for suture removal and wound check.

## 2022-02-26 ENCOUNTER — Other Ambulatory Visit (HOSPITAL_COMMUNITY): Payer: Self-pay

## 2022-02-26 ENCOUNTER — Encounter (HOSPITAL_COMMUNITY): Payer: Self-pay | Admitting: Orthopedic Surgery

## 2022-03-02 ENCOUNTER — Other Ambulatory Visit (HOSPITAL_COMMUNITY): Payer: Self-pay

## 2022-03-05 ENCOUNTER — Ambulatory Visit (INDEPENDENT_AMBULATORY_CARE_PROVIDER_SITE_OTHER): Payer: 59 | Admitting: Family Medicine

## 2022-03-05 ENCOUNTER — Encounter: Payer: Self-pay | Admitting: Family Medicine

## 2022-03-05 VITALS — BP 140/68 | HR 100 | Ht 63.0 in | Wt 324.0 lb

## 2022-03-05 DIAGNOSIS — E1165 Type 2 diabetes mellitus with hyperglycemia: Secondary | ICD-10-CM | POA: Diagnosis not present

## 2022-03-05 DIAGNOSIS — Z789 Other specified health status: Secondary | ICD-10-CM | POA: Diagnosis not present

## 2022-03-05 DIAGNOSIS — G5602 Carpal tunnel syndrome, left upper limb: Secondary | ICD-10-CM

## 2022-03-05 DIAGNOSIS — Z794 Long term (current) use of insulin: Secondary | ICD-10-CM

## 2022-03-05 NOTE — Progress Notes (Unsigned)
SUBJECTIVE:   CHIEF COMPLAINT / HPI:   Diabetes follow up Patient's diabetic regimen was adjusted prior to carpal tunnel surgery.  She is now back to her home regimen of Jardiance 10 mg, Lantus 40 units daily, and Mounjaro 7.5 mg weekly.  She reports her home blood sugars have been in the 120s to 130s, with no hypoglycemic episodes.  This is her first week on the Mounjaro dose of 7.5 weekly, she seems to be tolerating it well so far. Discussed how to adjust insulin down if she notices lower blood sugars the further long she gets on the Mounjaro 7.5 mg weekly.  Incontinence supplies  Patient reports the DME company [employer] should be sending a fax soon for incontinence supplies.  She requested with as soon as possible, because this will make a difference between delivery in 1 week versus 3 to 4 weeks per a company representative she says.  Carpal tunnel surgery Patient is recovering well from carpal tunnel surgery.  No adverse anesthesia side effects.  She is following instructions with the wrist brace in place.  No redness, swelling, discharge, or heat at the surgical site to indicate infection.  No fever or chills.  Weight loss desired Patient does strongly desire weight loss, especially so she can become a candidate for knee replacement as she has end-stage DJD of both knees -this is what keeps her wheelchair-bound.  She notes that when she saw the second orthopedic provider for an opinion on knee surgery, they asked her why she was not on any "weight loss medications".  Today she asks about starting a weight loss medication.  We discussed that Mounjaro, while we are using it for her diabetes, also has really great data for weight loss assistance.  She is also already on Wellbutrin, a medication that can be used for weight loss.  We have previously tried metformin, but patient was unable to tolerate due to severe nausea.  She is interested in possibly a weight loss clinic, and she prefers  to be sent to a [place] clinic if possible.  PERTINENT  PMH / PSH:  Patient Active Problem List   Diagnosis Date Noted   Weight loss advised 03/07/2022   Drug-induced constipation 12/09/2021   Anxiety 09/23/2021   Chronic right shoulder pain 03/24/2021   Left wrist pain 03/24/2021   Diabetic neuropathy ([place]) 02/25/2021   Healthcare maintenance 10/21/2020   Hyperlipidemia (primary prevention, LDL goal <70) 09/04/2020   Wheelchair dependence 05/05/2020   Carpal tunnel syndrome of right wrist, RECURRENT 10/15/2019   Insomnia, idiopathic 09/30/2018   Grade I diastolic dysfunction [id number]   Encounter for chronic pain management 02/11/2014   Diabetes ([place]) 02/11/2014   Cystocele 09/14/2012   Chronic pain of both knees 05/28/2010   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   Allergic rhinitis 05/30/2008   Carpal tunnel syndrome of left wrist 01/17/2008   OBESITY HYPOVENTILATION SYNDROME 12/27/2007   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   Obstructive sleep apnea 10/30/2007   BMI 50.0-59.9, adult ([place]) 04/10/2007   MYASTHENIA 03/07/2007   Depression, recurrent ([place]) 03/24/2006   Essential (primary) hypertension 03/24/2006   Gastro-esophageal reflux disease without esophagitis 03/24/2006    OBJECTIVE:   BP (!) 140/68   Pulse 100   Ht 5' 3"$  (1.6 m)   Wt (!) 324 lb (147 kg)   SpO2 97%   BMI 57.39 kg/m    PHQ-9:     03/05/2022    4:17 PM 01/08/2022  11:29 AM 12/08/2021    4:54 PM  Depression screen PHQ 2/9  Decreased Interest 2 0 2  Down, Depressed, Hopeless 1 0 1  PHQ - 2 Score 3 0 3  Altered sleeping 3  2  Tired, decreased energy 2  2  Change in appetite 2  3  Feeling bad or failure about yourself  2  2  Trouble concentrating 2  2  Moving slowly or fidgety/restless 2  2  Suicidal thoughts 0  0  PHQ-9 Score 16  16  Difficult doing work/chores Very difficult  Extremely dIfficult    Physical Exam General: Awake, alert, oriented, no acute  distress Respiratory: Unlabored respirations, speaking in full sentences, no respiratory distress Extremities: Moving all extremities spontaneously  ASSESSMENT/PLAN:   Diabetes (HCC) Back on normal home regimen of Jardiance 10 mg, Lantus 40 units daily, and Mounjaro 7.5 mg weekly.  Sugars have been excellently controlled.  This is week #1 of Mounjaro 7.5.  We discussed how to reduce her Lantus daily dose if her fasting morning sugars drop as we continue increasing Mounjaro. - Reduce Lantus by 2 units if fasting morning blood sugar under 120. -Follow-up via MyChart on week #3 or 4 of Mounjaro 7.5 weekly to assess tolerance - Plan to increase Mounjaro at the end of this month if she is tolerating well  Carpal tunnel syndrome of left wrist Doing well postop from carpal tunnel release.  No signs of infection.  Weight loss advised Patient strongly desires weight loss.  Currently on Mounjaro for diabetes, hopeful for weight loss side effects.  Patient interested in additional medication.  She is already on Wellbutrin.  Has documented intolerance to metformin.  History of anxiety, would not want to use stimulant in this patient.  I will look into additional alternatives for weight loss medications -I am hesitant to add more medication given that she already has a long list and interactions could be abundant.  Refer to weight loss clinic, preferably through [place] per patient.     Ezequiel Essex, MD [place]

## 2022-03-05 NOTE — Patient Instructions (Addendum)
It was wonderful to see you today. Thank you for allowing me to be a part of your care. Below is a short summary of what we discussed at your visit today:  Diabetes I am glad you are doing so well on your home regimen after surgery! Next due for A1c in June or July, about 6 months after January check.  Keep taking your Mounjaro 7.5 mg weekly dose via injection.   For adjustment of your daily insulin: If your fasting morning blood sugar is under 120, reduce your Lantus dose by 2 units.  Take this reduced dose for a couple of days before making any other reductions in your Lantus.   By the end of the 4 weeks on this new dose of Mounjaro, we should see where your blood sugar settles on this dose. Please send me a MyChart message at the end of this 4 weeks on Mounjaro 7.5 mg with your blood sugars and how you are feeling.   Incontinence supplies Once we have the order form faxed to [place], we will sign it and return to [facility].   Ophthalmology  You have been referred to the eye doctor for your diabetic eye exam. Please call the number below to make an appointment.  [facility]  Address: [address] [place], [place] [address] Phone: [phone]  Medicare Annual Wellness Exam Your chart indicates that you are due for your Medicare annual wellness exam.  Your insurance likes [place] to do one of these every year.  This is a nurse only visit that takes about 30 minutes to an hour.  It can be done either in person or virtually.  This is an in-depth visit that focuses on preventative care and keeping you healthy.  Somebody from our clinic will be calling you soon to get this scheduled.  Please bring all of your medications to every appointment!  If you have any questions or concerns, please do not hesitate to contact us via phone or MyChart message.   Ezequiel Essex, MD

## 2022-03-07 DIAGNOSIS — Z789 Other specified health status: Secondary | ICD-10-CM | POA: Insufficient documentation

## 2022-03-07 NOTE — Assessment & Plan Note (Signed)
Doing well postop from carpal tunnel release.  No signs of infection.

## 2022-03-07 NOTE — Assessment & Plan Note (Signed)
Patient strongly desires weight loss.  Currently on Mounjaro for diabetes, hopeful for weight loss side effects.  Patient interested in additional medication.  She is already on Wellbutrin.  Has documented intolerance to metformin.  History of anxiety, would not want to use stimulant in this patient.  I will look into additional alternatives for weight loss medications -I am hesitant to add more medication given that she already has a long list and interactions could be abundant.  Refer to weight loss clinic, preferably through [facility] per patient.

## 2022-03-07 NOTE — Assessment & Plan Note (Signed)
Back on normal home regimen of Jardiance 10 mg, Lantus 40 units daily, and Mounjaro 7.5 mg weekly.  Sugars have been excellently controlled.  This is week #1 of Mounjaro 7.5.  We discussed how to reduce her Lantus daily dose if her fasting morning sugars drop as we continue increasing Mounjaro. - Reduce Lantus by 2 units if fasting morning blood sugar under 120. -Follow-up via MyChart on week #3 or 4 of Mounjaro 7.5 weekly to assess tolerance - Plan to increase Mounjaro at the end of this month if she is tolerating well

## 2022-03-19 ENCOUNTER — Other Ambulatory Visit: Payer: Self-pay

## 2022-03-19 ENCOUNTER — Other Ambulatory Visit: Payer: Self-pay | Admitting: Family Medicine

## 2022-03-19 ENCOUNTER — Other Ambulatory Visit (HOSPITAL_COMMUNITY): Payer: Self-pay

## 2022-03-19 DIAGNOSIS — E785 Hyperlipidemia, unspecified: Secondary | ICD-10-CM

## 2022-03-19 DIAGNOSIS — M174 Other bilateral secondary osteoarthritis of knee: Secondary | ICD-10-CM

## 2022-03-19 DIAGNOSIS — G8929 Other chronic pain: Secondary | ICD-10-CM

## 2022-03-19 DIAGNOSIS — E0810 Diabetes mellitus due to underlying condition with ketoacidosis without coma: Secondary | ICD-10-CM

## 2022-03-19 DIAGNOSIS — E1169 Type 2 diabetes mellitus with other specified complication: Secondary | ICD-10-CM

## 2022-03-19 DIAGNOSIS — I10 Essential (primary) hypertension: Secondary | ICD-10-CM

## 2022-03-19 MED ORDER — DICLOFENAC SODIUM 50 MG PO TBEC
50.0000 mg | DELAYED_RELEASE_TABLET | Freq: Two times a day (BID) | ORAL | 0 refills | Status: DC | PRN
  Filled 2022-03-19: qty 60, 30d supply, fill #0

## 2022-03-19 MED ORDER — OXYBUTYNIN CHLORIDE 5 MG PO TABS
5.0000 mg | ORAL_TABLET | Freq: Two times a day (BID) | ORAL | 1 refills | Status: DC
  Filled 2022-03-19: qty 60, 30d supply, fill #0
  Filled 2022-04-17: qty 60, 30d supply, fill #1

## 2022-03-19 MED ORDER — ATORVASTATIN CALCIUM 40 MG PO TABS
80.0000 mg | ORAL_TABLET | Freq: Every day | ORAL | 3 refills | Status: DC
  Filled 2022-03-19: qty 180, 90d supply, fill #0
  Filled 2022-06-28: qty 180, 90d supply, fill #1
  Filled 2022-10-05: qty 180, 90d supply, fill #2
  Filled 2023-01-27: qty 180, 90d supply, fill #3

## 2022-03-19 MED FILL — Glucose Blood Test Strip: 33 days supply | Qty: 100 | Fill #3 | Status: AC

## 2022-03-29 ENCOUNTER — Telehealth: Payer: Self-pay

## 2022-03-29 NOTE — Progress Notes (Signed)
Patient attempted to be outreached by Wallene Huh, PharmD Candidate on 03/29/2022 to discuss hypertension. Left voicemail for patient to return our call at their convenience at [phone].   Joseph Art, Pharm.D. PGY-2 Ambulatory Care Pharmacy Resident

## 2022-04-01 ENCOUNTER — Encounter: Payer: Self-pay | Admitting: Radiology

## 2022-04-09 ENCOUNTER — Other Ambulatory Visit (HOSPITAL_BASED_OUTPATIENT_CLINIC_OR_DEPARTMENT_OTHER): Payer: Self-pay

## 2022-04-09 ENCOUNTER — Other Ambulatory Visit: Payer: Self-pay

## 2022-04-09 ENCOUNTER — Other Ambulatory Visit (HOSPITAL_COMMUNITY): Payer: Self-pay

## 2022-04-12 ENCOUNTER — Other Ambulatory Visit (HOSPITAL_COMMUNITY): Payer: Self-pay

## 2022-04-12 ENCOUNTER — Other Ambulatory Visit: Payer: Self-pay

## 2022-04-12 DIAGNOSIS — H16143 Punctate keratitis, bilateral: Secondary | ICD-10-CM | POA: Diagnosis not present

## 2022-04-12 DIAGNOSIS — H04129 Dry eye syndrome of unspecified lacrimal gland: Secondary | ICD-10-CM | POA: Diagnosis not present

## 2022-04-12 DIAGNOSIS — Z862 Personal history of diseases of the blood and blood-forming organs and certain disorders involving the immune mechanism: Secondary | ICD-10-CM | POA: Diagnosis not present

## 2022-04-12 MED ORDER — CYCLOSPORINE 0.05 % OP EMUL
1.0000 [drp] | Freq: Two times a day (BID) | OPHTHALMIC | 3 refills | Status: DC
  Filled 2022-04-12: qty 5.5, 7d supply, fill #0
  Filled 2022-04-17: qty 5.5, 25d supply, fill #0
  Filled 2022-05-03 – 2022-05-24 (×2): qty 5.5, 25d supply, fill #1
  Filled 2022-06-14: qty 5.5, 25d supply, fill #2
  Filled 2022-07-15: qty 5.5, 25d supply, fill #3

## 2022-04-13 ENCOUNTER — Other Ambulatory Visit (HOSPITAL_COMMUNITY): Payer: Self-pay

## 2022-04-13 ENCOUNTER — Other Ambulatory Visit: Payer: Self-pay

## 2022-04-15 ENCOUNTER — Other Ambulatory Visit: Payer: Self-pay

## 2022-04-15 ENCOUNTER — Telehealth: Payer: Self-pay

## 2022-04-15 NOTE — Telephone Encounter (Signed)
A Prior Authorization was initiated for this patients DEXCOM G6 RECEIVE through CoverMyMeds.   Key: [id number]

## 2022-04-17 ENCOUNTER — Other Ambulatory Visit (HOSPITAL_COMMUNITY): Payer: Self-pay | Admitting: Surgical

## 2022-04-17 ENCOUNTER — Other Ambulatory Visit: Payer: Self-pay | Admitting: Family Medicine

## 2022-04-17 DIAGNOSIS — Z6841 Body Mass Index (BMI) 40.0 and over, adult: Secondary | ICD-10-CM

## 2022-04-17 DIAGNOSIS — E1165 Type 2 diabetes mellitus with hyperglycemia: Secondary | ICD-10-CM

## 2022-04-17 MED FILL — Glucose Blood Test Strip: 33 days supply | Qty: 100 | Fill #4 | Status: AC

## 2022-04-18 ENCOUNTER — Other Ambulatory Visit: Payer: Self-pay

## 2022-04-19 ENCOUNTER — Other Ambulatory Visit (HOSPITAL_COMMUNITY): Payer: Self-pay

## 2022-04-19 ENCOUNTER — Other Ambulatory Visit: Payer: Self-pay

## 2022-04-19 MED ORDER — MOUNJARO 7.5 MG/0.5ML ~~LOC~~ SOAJ
7.5000 mg | SUBCUTANEOUS | 3 refills | Status: DC
  Filled 2022-04-19: qty 2, 28d supply, fill #0
  Filled 2022-05-03 – 2022-05-24 (×2): qty 2, 28d supply, fill #1

## 2022-04-19 NOTE — Therapy (Unsigned)
OUTPATIENT PHYSICAL THERAPY TREATMENT NOTE/PROGRESS NOTE  Patient Name: [name] MRN: [id number] DOB:10/13/1965, 57 y.o., female Today's Date: 04/22/2022  PCP: Ezequiel Essex, MD    REFERRING PROVIDER: Gentry Fitz, MD    PT End of Session - 04/22/22 1219     Visit Number 7    Number of Visits 20    Date for PT Re-Evaluation 06/17/22    Authorization Type UHC    PT Start Time [id number]    PT Stop Time 1300    PT Time Calculation (min) 45 min    Activity Tolerance Patient limited by pain;Patient tolerated treatment well    Behavior During Therapy [facility] for tasks assessed/performed              Past Medical History:  Diagnosis Date   ALLERGIC RHINITIS    takes Zyrtec daily   Anxiety    Arthritis    Asthma    Albuterol prn;Symbicort daily and Singulair daily   Bronchitis    hx of   CAP (community acquired pneumonia) 02/20/2015   Carpal tunnel syndrome    right   Chronic back pain    Constipation    Miralax prn   Depression    takes Cymbalta daily   Diabetes ([place])    type 2    Eczema    Gallstones 2010   GERD (gastroesophageal reflux disease)    takes Omeprazole daily   Hemorrhoids    Hypertension    takes Prinizide daily and Clonidine on Mondays   Hypoventilation associated with obesity syndrome (HCC)    Insomnia    Irritable bladder    Joint pain    Morbid (severe) obesity due to excess calories ([place]) 04/10/2007   Restrictive changes on pfts 11/30/2007     Myasthenia gravis 1994   Positive Acetylcholine receptor Ab and single fiber EMG Dr. Barbaraann Share [facility]- Dr. 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 Chesley Mires- 10/30/2007 ANA positive, ANA titer  negative, RF less than 20   Rheumatoid arthritis(714.0)    Sarcoidosis 12/23/2010   Derm  dx NCG  12/03/10 (path report in EPIC)    Overview:  Overview:  Diagnosed on skin biopsy November 2012  Last Assessment & Plan:  Labs are unremarkable. CXR improved/stable. PFT wnl. I am happy patient had these tests and we have a baseline for her. At this time, I do not see an indication for starting chronic steroids daily. Patient agrees. Her skin lesions are bothering her the most and syst   Shortness of breath    can be sitting as well as exertion   Skin irritation    skin itchy   Trigger finger    Urinary frequency    takes Ditropan daily   Viral meningitis    history of viral meningitis   Past Surgical History:  Procedure Laterality Date   BRONCHOSCOPY  08/2007   CARPAL TUNNEL RELEASE  05/20/2011   Procedure: CARPAL TUNNEL RELEASE;  Surgeon: Nita Sells, MD;  Location: [facility];  Service: Orthopedics;  Laterality:  Right;   CARPAL TUNNEL RELEASE Right 11/22/2019   Procedure: RIGHT HAND CARPAL TUNNEL RELEASE;  Surgeon: Tania Ade, MD;  Location: [facility];  Service: Orthopedics;  Laterality: Right;   CARPAL TUNNEL RELEASE Left 02/25/2022   Procedure: CARPAL TUNNEL RELEASE;  Surgeon: Tania Ade, MD;  Location: [facility];  Service: Orthopedics;  Laterality: Left;   CESAREAN SECTION  09/15/2004   COLONOSCOPY N/A 01/21/2014   Procedure: COLONOSCOPY;  Surgeon: Gatha Mayer, MD;  Location: [facility];  Service: Endoscopy;  Laterality: N/A;   cortisone injection     receives an injection every 57months   ESOPHAGOGASTRODUODENOSCOPY     ESOPHAGOGASTRODUODENOSCOPY N/A 01/21/2014   Procedure: ESOPHAGOGASTRODUODENOSCOPY (EGD);  Surgeon: Gatha Mayer, MD;  Location: [facility];  Service: Endoscopy;  Laterality: N/A;   LAPAROSCOPIC CHOLECYSTECTOMY  07/2008   by Dr. Neldon Mc   Thymus  resection  08/25/1993   TRIGGER FINGER RELEASE  05/20/2011   Procedure: RELEASE TRIGGER FINGER/A-1 PULLEY;  Surgeon: Nita Sells, MD;  Location: [facility];  Service: Orthopedics;  Laterality: Right;  RIGHT TRIGGER THUMB RELEASE AND RIGHT CARPAL TUNNEL RELEASE   Patient Active Problem List   Diagnosis Date Noted   Weight loss advised 03/07/2022   Drug-induced constipation 12/09/2021   Anxiety 09/23/2021   Chronic right shoulder pain 03/24/2021   Left wrist pain 03/24/2021   Diabetic neuropathy ([place]) 02/25/2021   Healthcare maintenance 10/21/2020   Hyperlipidemia (primary prevention, LDL goal <70) 09/04/2020   Wheelchair dependence 05/05/2020   Carpal tunnel syndrome of right wrist, RECURRENT 10/15/2019   Insomnia, idiopathic 09/30/2018   Grade I diastolic dysfunction [id number]   Encounter for chronic pain management 02/11/2014   Diabetes ([place]) 02/11/2014   Cystocele 09/14/2012   Chronic pain of both knees 05/28/2010   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   Allergic rhinitis 05/30/2008   Carpal tunnel syndrome of left wrist 01/17/2008   OBESITY HYPOVENTILATION SYNDROME 12/27/2007   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   Obstructive sleep apnea 10/30/2007   BMI 50.0-59.9, adult ([place]) 04/10/2007   MYASTHENIA 03/07/2007   Depression, recurrent ([place]) 03/24/2006   Essential (primary) hypertension 03/24/2006   Gastro-esophageal reflux disease without esophagitis 03/24/2006    THERAPY DIAG:  Chronic pain of left knee - Plan: PT plan of care cert/re-cert  Chronic pain of right knee - Plan: PT plan of care cert/re-cert  Muscle weakness (generalized) - Plan: PT plan of care cert/re-cert   Rationale for Evaluation and Treatment Rehabilitation  REFERRING DIAG: BILATERAL KNEE OSTEOARTHRITIS :HIP MUSCLE ; KNEE FLEXION   PERTINENT HISTORY:  HPI Patient is 57 year old female comes in today with bilateral knee pain second opinion.  She has been told  that she has arthritis in both knees.  She was seen at [facility] recently and told she needed to work on strengthening her legs and they have actually ordered therapy.  They also discussed with her that she needed to work on weight loss which she is doing.  She states she has had bilateral knee pain for some 30 years.  No known injury.  Radiographs in 2019 are available and show severe tricompartmental arthritis of the right knee.  She does take diclofenac Tylenol Lyrica and oxycodone for her knee pain but states this really helped.  She is also tried injections both cortisone and gel injections without any real relief.  She notes that she has been basically dependent upon a motorized wheelchair for the last 2 years.  She does ambulate short  distances though.  She states prior to the motorized wheelchair she was walking with a walker.  She is lost from 430 pounds down to 316 pounds.  States due to her myasthenia gravis she has been on prednisone.  Has gained a lot of weight over the years.  She has also had weakness in the extremities for years.  Reports that her diabetes is under good control at this point in time.  PRECAUTIONS/RESTRICTIONS:   FALL  SUBJECTIVE: Patient returns to OPPT following a 60 day absence due to CTS.  She has not been consistent with her HEP for her knee issues.   PAIN:  Are you having pain? Yes: NPRS scale: 8/10 (L>R) Pain location: B knees Pain description: ache Aggravating factors: everything Relieving factors: nothing  OBJECTIVE: (objective measures completed at initial evaluation unless otherwise dated)  DIAGNOSTIC FINDINGS: CLINICAL DATA:  57 year old female with a history of chronic right knee pain   EXAM: RIGHT KNEE 3 VIEWS   COMPARISON:  08/03/2013   FINDINGS: No acute displaced fracture. Advanced joint space narrowing of the mediolateral compartment with marginal osteophyte formation, sclerotic changes. Degenerative changes of the  patellofemoral joint. Changes have not progressed since the comparison of 08/03/2013   IMPRESSION: Advanced tricompartmental osteoarthritis, worse than the comparison study of 2015.   No acute bony abnormality identified.     Electronically Signed   By: Corrie Mckusick D.O.   On: 11/09/2017 09:34     PATIENT SURVEYS:  FOTO 21(56 predicted)    COGNITION: Overall cognitive status: Within functional limits for tasks assessed                         SENSATION: Not tested   MUSCLE LENGTH: UTA due to pain levels   POSTURE:  UTA due to poor standing tolerance   PALPATION: deferred   LOWER EXTREMITY ROM:   Active ROM Right eval Left eval R 04/22/22 L  04/22/22  Hip flexion        Hip extension        Hip abduction        Hip adduction        Hip internal rotation        Hip external rotation        Knee flexion 92d 100d 110d 95d  Knee extension -25d -25 -40/-35d -30/-45d  Ankle dorsiflexion        Ankle plantarflexion        Ankle inversion        Ankle eversion         (Blank rows = not tested)   LOWER EXTREMITY MMT:   MMT Right eval Left eval R 04/22/22 L 04/22/22  Hip flexion 3 3 3+ 3+  Hip extension        Hip abduction 3 3 3+ 3+  Hip adduction        Hip internal rotation        Hip external rotation        Knee flexion 3+ 3+ 3+ 3+  Knee extension 3+ 3+ 3+ 3+  Ankle dorsiflexion        Ankle plantarflexion 3+ 3+ UTA UTA  Ankle inversion        Ankle eversion         PF strength testing deferred due to B achilles pain.   LOWER EXTREMITY SPECIAL TESTS:  UTA due to pain and limited tolerance to testing positions    FUNCTIONAL TESTS:  30 seconds chair stand test 0 reps 04/22/22 30s chair stand test 4 reps with UE support   GAIT: Distance walked: 62ft Assistive device utilized: Wheelchair (power) Level of assistance: Complete Independence Comments: Patient using power chair for mobility 04/22/22 57ft with SBA and B canes      PATIENT EDUCATION:   Education details: Discussed eval findings, rehab rationale and POC and patient is in agreement  Person educated: Patient Education method: Explanation Education comprehension: verbalized understanding and needs further education   HOME EXERCISE PROGRAM: AAccess Code: [email or web address] URL: [email or web address] Date: 04/22/2022 Prepared by: Sharlynn Oliphant  Exercises - Seated Long Arc Quad  - 3 x daily - 5 x weekly - 2 sets - 10 reps - Seated Hamstring Stretch  - 3 x daily - 5 x weekly - 1 sets - 2 reps - 30s hold - Seated Heel Slide  - 3 x daily - 5 x weekly - 2 sets - 10 reps - 2s hold  [facility] Adult PT Treatment:                                                DATE: 04/22/22 Therapeutic Exercise: Seated heel slides 10x B Seated FAQs 10x B Seated hamstring stretch 30s x2 B Gait 53ft with B canes and SBA   [facility] Adult PT Treatment:                                                DATE: 02/19/2022 Aquatic therapy at [address] - therapeutic pool temp 91 degrees in lap pool Pt enters building via W/C and son accompanying. Treatment took place in water 3.8 to 4 ft 8 in.feet deep depending upon activity.  Pt entered and exited the pool via stair and handrails.  Water walking for warm up forwards, and backwards and side stepping with blue DB  At edge of pool, pt performed LE exercise Heel raises - 2x20 Walking march with blue DB x2 laps Hamstring curl x20 Lt (Rt painful and ROM limited today) Hip circles CW/CCW x10 each BIL   Hip abd/add x20 BIL Runners Stretch x 30" BIL Hamstring stretch x 30" BIL Step ups on submerged step x10 fwd/lat BIL On submerged bench LAQ x1' Bicycle kicks x 1' Flutter kicks x 1' Scissor kicks x 1'  Pt requires the buoyancy of water for active assisted exercises with buoyancy supported for strengthening and AROM exercises. Hydrostatic pressure also supports joints by unweighting joint load by at least 50 % in 3-4 feet depth water.  80% in chest to neck deep water. Water will provide assistance with movement using the current and laminar flow while the buoyancy reduces weight bearing. Pt requires the viscosity of the water for resistance with strengthening exercises.   [facility] Adult PT Treatment:                                                DATE: 02-17-22 Aquatic therapy at [address] - therapeutic pool temp 91 degrees in lap pool Pt enters building via W/C and son accompanying. Treatment took place  in water 3.8 to  4 ft 8 in.feet deep depending upon activity.  Pt entered and exited the pool via stair and handrails   Pain at initiation of RX  6/10  RT knee and LT 4/10    At end  RT 7/10 and LT 4/10  Aquatic Therapy:  Water walking for warm up forwards, and backwards and side stepping Aquastretch for BiIL quadriceps and medial knees BIL At edge of pool, pt performed LE exercise: all exercises below with resistive swimming ankle fins Heel raises - 2x20 Walking march Hamstring curl x20 BIL  Hip circles CW/CCW x10 BIL   Hip abd/add x20 BIL Runners Stretch x 30" x 2 BIL Hamstring stretch x 30" x 2 BIL Step ups on submerged step x10 fwd/lat BIL Step downs with heel tap RT on step x 10, LT on step 2 x 5 Monster walks laterally across pool x 3 15 ft each     Pt requires the buoyancy of water for active assisted exercises with buoyancy supported for strengthening and AROM exercises. Hydrostatic pressure also supports joints by unweighting joint load by at least 50 % in 3-4 feet depth water. 80% in chest to neck deep water. Water will provide assistance with movement using the current and laminar flow while the buoyancy reduces weight bearing. Pt requires the viscosity of the water for resistance with strengthening exercises.  [facility] Adult PT Treatment:                                                DATE: 02/12/2022 Aquatic therapy at [address] - therapeutic pool temp approximately 92  degrees. Pt enters building via W/C and son accompanying. Treatment took place in water 3.8 to  4 ft 8 in.feet deep depending upon activity.  Pt entered and exited the pool via stair and handrails with step to pattern.  Aquatic Therapy:  Water walking for warm up forwards, and backwards and side stepping holding blue DB Therapeutic Exercise: Step ups on submerged step x10 fwd/lat BIL Walking march Lateral walking with DB push downs Fwd walking with DB push downs At edge of pool, pt performed LE exercise: Heel raises - 2x20 Hamstring curl x20 BIL  Hip circles CW/CCW x10 BIL   Hip abd/add x20 BIL   On submerged bench Bicycle kicks x 1' Reverse bicycle kicks x 1' Flutter kicks x 1' Scissor kicks x 1' Pt requires the buoyancy of water for active assisted exercises with buoyancy supported for strengthening and AROM exercises. Hydrostatic pressure also supports joints by unweighting joint load by at least 50 % in 3-4 feet depth water. 80% in chest to neck deep water. Water will provide assistance with movement using the current and laminar flow while the buoyancy reduces weight bearing. Pt requires the viscosity of the water for resistance with strengthening exercises.     ASSESSMENT:   CLINICAL IMPRESSION: Patient returns to OPPT for continue treatment to B knees due to underlying pain from OA.  She has been offered B TKA once BMI is in acceptable range.  Patient presents with ROM and strength deficits, limited ambulation tolerance due to pain, weakness and loss of endurance.  Patient uses power chair as main means of transportation/mobility.  Sh is able to ambulate household distances with single cane and furniture surfing.  Patient is a good candidate for continued  OPPT consisting of land based PT initially to establish a concise HEP following by a short episode of aquatic PT to establish an independent program using one of the local pool facilities.    OBJECTIVE IMPAIRMENTS: Abnormal  gait, decreased activity tolerance, decreased balance, decreased endurance, decreased knowledge of condition, decreased knowledge of use of DME, decreased mobility, difficulty walking, decreased ROM, decreased strength, obesity, and pain.    ACTIVITY LIMITATIONS: carrying, lifting, standing, squatting, stairs, and transfers   PERSONAL FACTORS: Age, Fitness, Past/current experiences, and Time since onset of injury/illness/exacerbation are also affecting patient's functional outcome.    REHAB POTENTIAL: Fair based on chronicity, unsuccessful conservative interventions and advanced OA of B knees   CLINICAL DECISION MAKING: Stable/uncomplicated   EVALUATION COMPLEXITY: Low     GOALS: Goals reviewed with patient? No   SHORT TERM GOALS: Target date: 02/03/2022   Patient to demonstrate independence in HEP  Baseline:[email or web address] Goal status: Ongoing Pt reports non-compliance 02/12/22   2.  Initiate aquatic program Baseline: TBD Goal status: Met Initiated      LONG TERM GOALS: Target date: 06/22/2022     Increase BLE strength to 4-/5 in deficit areas Baseline:  MMT Right eval Left eval R 04/22/22 L 04/22/22  Hip flexion 3 3 3+ 3+  Hip extension        Hip abduction 3 3 3+ 3+  Hip adduction        Hip internal rotation        Hip external rotation        Knee flexion 3+ 3+ 3+ 3+  Knee extension 3+ 3+ 3+ 3+  Ankle dorsiflexion        Ankle plantarflexion 3+ 3+ UTA UTA    Goal status: INITIAL   2.  Increase FOTO score to 56 Baseline: 21 Goal status: INITIAL   3.  Increase B knee extension AROM to -10d B and flexion to 115d B Baseline:    R 01/20/22  L 01/20/22 R 04/22/22 L 04/22/22  Knee flexion 92d 100d 110d 95d  Knee extension -25d -25 -40/-35d -30/-45d   Goal status: INITIAL  4. Increase ambulation distance to 116ft with single cane  Baseline: 44ft with B canes and SBA  Goal Status: Initial  5. Increase 30 second chair stand reps to 6 with UE support  Baseline: 4  with UE support  Goal Status: Initial       PLAN:   PT FREQUENCY: 1-2x/week   PT DURATION: 4 weeks   PLANNED INTERVENTIONS: Therapeutic exercises, Therapeutic activity, Neuromuscular re-education, Balance training, Gait training, Patient/Family education, Self Care, Joint mobilization, Stair training, DME instructions, and Re-evaluation   PLAN FOR NEXT SESSION: HEP review and update, ROM, strengthening, gait training and functional activities  Leroy Sea PT  04/22/22 1:45 PM Phone: [phone] Fax: [phone]

## 2022-04-19 NOTE — Progress Notes (Signed)
Patient attempted to be outreached by Darrall Dears, PharmD Candidate on 04/19/2022 to discuss hypertension. Left voicemail for patient to return our call at their convenience at [phone].   Darrall Dears,  PharmD Candidate   Joseph Art, Pharm.D. PGY-2 Ambulatory Care Pharmacy Resident

## 2022-04-19 NOTE — Telephone Encounter (Signed)
Prior auth outcome: N/A  Additional quantities of DEXCOM G6 MIS RECEIVER are not available as part of the Part B pharmacy benefit.

## 2022-04-21 ENCOUNTER — Other Ambulatory Visit: Payer: Self-pay

## 2022-04-22 ENCOUNTER — Other Ambulatory Visit: Payer: Self-pay

## 2022-04-22 ENCOUNTER — Ambulatory Visit: Payer: 59 | Attending: Family Medicine

## 2022-04-22 DIAGNOSIS — M25562 Pain in left knee: Secondary | ICD-10-CM | POA: Insufficient documentation

## 2022-04-22 DIAGNOSIS — M6281 Muscle weakness (generalized): Secondary | ICD-10-CM | POA: Diagnosis not present

## 2022-04-22 DIAGNOSIS — G8929 Other chronic pain: Secondary | ICD-10-CM

## 2022-04-22 DIAGNOSIS — M25561 Pain in right knee: Secondary | ICD-10-CM | POA: Insufficient documentation

## 2022-04-26 ENCOUNTER — Other Ambulatory Visit (HOSPITAL_COMMUNITY): Payer: Self-pay

## 2022-04-26 NOTE — Therapy (Unsigned)
OUTPATIENT PHYSICAL THERAPY TREATMENT NOTE/PROGRESS NOTE  Patient Name: [name] MRN: [id number] DOB:24-Jun-1965, 57 y.o., female Today's Date: 04/27/2022  PCP: Ezequiel Essex, MD    REFERRING PROVIDER: Gentry Fitz, MD    PT End of Session - 04/27/22 1751     Visit Number 8    Number of Visits 20    Date for PT Re-Evaluation 06/17/22              Past Medical History:  Diagnosis Date   ALLERGIC RHINITIS    takes Zyrtec daily   Anxiety    Arthritis    Asthma    Albuterol prn;Symbicort daily and Singulair daily   Bronchitis    hx of   CAP (community acquired pneumonia) 02/20/2015   Carpal tunnel syndrome    right   Chronic back pain    Constipation    Miralax prn   Depression    takes Cymbalta daily   Diabetes    type 2    Eczema    Gallstones 2010   GERD (gastroesophageal reflux disease)    takes Omeprazole daily   Hemorrhoids    Hypertension    takes Prinizide daily and Clonidine on Mondays   Hypoventilation associated with obesity syndrome    Insomnia    Irritable bladder    Joint pain    Morbid (severe) obesity due to excess calories 04/10/2007   Restrictive changes on pfts 11/30/2007     Myasthenia gravis 1994   Positive Acetylcholine receptor Ab and single fiber EMG Dr. Barbaraann Share [facility]- Dr. 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 Chesley Mires- 10/30/2007 ANA positive, ANA titer  negative, RF less than 20   Rheumatoid arthritis(714.0)    Sarcoidosis 12/23/2010   Derm  dx NCG  12/03/10 (path report in EPIC)    Overview:  Overview:  Diagnosed on skin  biopsy November 2012  Last Assessment & Plan:  Labs are unremarkable. CXR improved/stable. PFT wnl. I am happy patient had these tests and we have a baseline for her. At this time, I do not see an indication for starting chronic steroids daily. Patient agrees. Her skin lesions are bothering her the most and syst   Shortness of breath    can be sitting as well as exertion   Skin irritation    skin itchy   Trigger finger    Urinary frequency    takes Ditropan daily   Viral meningitis    history of viral meningitis   Past Surgical History:  Procedure Laterality Date   BRONCHOSCOPY  08/2007   CARPAL TUNNEL RELEASE  05/20/2011   Procedure: CARPAL TUNNEL RELEASE;  Surgeon: Nita Sells, MD;  Location: [facility];  Service: Orthopedics;  Laterality: Right;   CARPAL TUNNEL RELEASE Right 11/22/2019   Procedure: RIGHT HAND CARPAL TUNNEL RELEASE;  Surgeon: Tania Ade, MD;  Location: [facility];  Service: Orthopedics;  Laterality: Right;   CARPAL TUNNEL RELEASE Left 02/25/2022   Procedure: CARPAL TUNNEL RELEASE;  Surgeon: Tania Ade, MD;  Location: [facility];  Service: Orthopedics;  Laterality: Left;   CESAREAN SECTION  09/15/2004   COLONOSCOPY N/A 01/21/2014   Procedure: COLONOSCOPY;  Surgeon: Gatha Mayer, MD;  Location: [facility];  Service: Endoscopy;  Laterality: N/A;   cortisone injection     receives an injection every 69months   ESOPHAGOGASTRODUODENOSCOPY     ESOPHAGOGASTRODUODENOSCOPY N/A 01/21/2014   Procedure: ESOPHAGOGASTRODUODENOSCOPY (EGD);  Surgeon: Gatha Mayer, MD;  Location: [facility];  Service: Endoscopy;  Laterality: N/A;   LAPAROSCOPIC CHOLECYSTECTOMY  07/2008   by Dr. Neldon Mc   Thymus resection  08/25/1993   TRIGGER FINGER RELEASE  05/20/2011   Procedure: RELEASE TRIGGER FINGER/A-1 PULLEY;  Surgeon: Nita Sells, MD;  Location: [facility];  Service: Orthopedics;  Laterality: Right;  RIGHT TRIGGER THUMB RELEASE AND RIGHT CARPAL TUNNEL RELEASE    Patient Active Problem List   Diagnosis Date Noted   Weight loss advised 03/07/2022   Drug-induced constipation 12/09/2021   Anxiety 09/23/2021   Chronic right shoulder pain 03/24/2021   Left wrist pain 03/24/2021   Diabetic neuropathy 02/25/2021   Healthcare maintenance 10/21/2020   Hyperlipidemia (primary prevention, LDL goal <70) 09/04/2020   Wheelchair dependence 05/05/2020   Carpal tunnel syndrome of right wrist, RECURRENT 10/15/2019   Insomnia, idiopathic 09/30/2018   Grade I diastolic dysfunction [id number]   Encounter for chronic pain management 02/11/2014   Diabetes 02/11/2014   Cystocele 09/14/2012   Chronic pain of both knees 05/28/2010   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   Allergic rhinitis 05/30/2008   Carpal tunnel syndrome of left wrist 01/17/2008   OBESITY HYPOVENTILATION SYNDROME 12/27/2007   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  She has been told that she has arthritis in both knees.  She was seen at [facility] recently and told she needed to work on strengthening her legs and they have actually ordered therapy.  They also discussed with her that she needed to work on weight loss which she is doing.  She states she has had bilateral knee pain for some 30 years.  No known injury.  Radiographs in 2019 are available and show severe  tricompartmental arthritis of the right knee.  She does take diclofenac Tylenol Lyrica and oxycodone for her knee pain but states this really helped.  She is also tried injections both cortisone and gel injections without any real relief.  She notes that she has been basically dependent upon a motorized wheelchair for the last 2 years.  She does ambulate short distances though.  She states prior to the motorized wheelchair she was walking with a walker.  She is lost from 430 pounds down to 316 pounds.  States due to her myasthenia gravis she has been on prednisone.  Has gained a lot of weight over the years.  She has also had weakness in the extremities for years.  Reports that her diabetes is under good control at this point in time.  PRECAUTIONS/RESTRICTIONS:   FALL  SUBJECTIVE: No changes to report.  Has forgotten to perform HEP   PAIN:  Are you having pain? Yes: NPRS scale: 8/10 (L>R) Pain location: B knees Pain description: ache Aggravating factors: everything Relieving factors: nothing  OBJECTIVE: (objective measures completed at initial evaluation unless otherwise dated)  DIAGNOSTIC FINDINGS: CLINICAL DATA:  58 year old female with a history of chronic right knee pain   EXAM: RIGHT KNEE 3 VIEWS   COMPARISON:  08/03/2013   FINDINGS: No acute displaced fracture. Advanced joint space narrowing of the mediolateral compartment with marginal osteophyte formation, sclerotic changes. Degenerative changes of the patellofemoral joint. Changes have not progressed since the comparison of 08/03/2013   IMPRESSION: Advanced tricompartmental osteoarthritis, worse than the comparison study of 2015.   No acute bony abnormality identified.     Electronically Signed   By: Corrie Mckusick D.O.   On: 11/09/2017 09:34     PATIENT SURVEYS:  FOTO 21(56 predicted)    COGNITION: Overall cognitive status: Within functional limits for tasks assessed                         SENSATION: Not  tested   MUSCLE LENGTH: UTA due to pain levels   POSTURE:  UTA due to poor standing tolerance   PALPATION: deferred   LOWER EXTREMITY ROM:   Active ROM Right eval Left eval R 04/22/22 L  04/22/22  Hip flexion        Hip extension        Hip abduction        Hip adduction        Hip internal rotation        Hip external rotation        Knee flexion 92d 100d 110d 95d  Knee extension -25d -25 -40/-35d -30/-45d  Ankle dorsiflexion        Ankle plantarflexion        Ankle inversion        Ankle eversion         (Blank rows = not tested)   LOWER EXTREMITY MMT:   MMT Right eval Left eval R 04/22/22 L 04/22/22  Hip flexion 3 3 3+ 3+  Hip extension        Hip abduction 3 3 3+ 3+  Hip adduction        Hip internal rotation        Hip external rotation        Knee flexion 3+ 3+ 3+ 3+  Knee extension 3+ 3+ 3+ 3+  Ankle dorsiflexion        Ankle plantarflexion 3+ 3+ UTA UTA  Ankle inversion        Ankle eversion         PF strength testing deferred due to B achilles pain.   LOWER EXTREMITY SPECIAL TESTS:  UTA due to pain and limited tolerance to testing positions    FUNCTIONAL TESTS:  30 seconds chair stand test 0 reps 04/22/22 30s chair stand test 4 reps with UE support   GAIT: Distance walked: 72ft Assistive device utilized: Wheelchair (power) Level of assistance: Complete Independence Comments: Patient using power chair for mobility 04/22/22 29ft with SBA and B canes      PATIENT EDUCATION:  Education details: Discussed eval findings, rehab rationale and POC and patient is in agreement  Person educated: Patient Education method: Explanation Education comprehension: verbalized understanding and needs further education   HOME EXERCISE PROGRAM: AAccess Code: [email or web address] URL: [email or web address] Date:  04/22/2022 Prepared by: Sharlynn Oliphant  Exercises - Seated Long Arc Quad  - 3 x daily - 5 x weekly - 2 sets - 10 reps - Seated Hamstring Stretch   - 3 x daily - 5 x weekly - 1 sets - 2 reps - 30s hold - Seated Heel Slide  - 3 x daily - 5 x weekly - 2 sets - 10 reps - 2s hold  [facility] Adult PT Treatment:                                                DATE: 04/27/22 Therapeutic Exercise: Seated hamstring stretch over stool 30s x2 B Seated gastroc stretch with towel 30s x2 B FAQs 2# 15x2 B Seated ham curls 15x2 B YTB Supine SLR 15x B Supine SAQs 15x2 B Heel slides with strap 15x B Supine hip fallouts RTB 15x B, 15/15 unilaterally   [facility] Adult PT Treatment:                                                DATE: 04/22/22 Therapeutic Exercise: Seated heel slides 10x B Seated FAQs 10x B Seated hamstring stretch 30s x2 B Gait 49ft with B canes and SBA   [facility] Adult PT Treatment:                                                DATE: 02/19/2022 Aquatic therapy at [address] - therapeutic pool temp 91 degrees in lap pool Pt enters building via W/C and son accompanying. Treatment took place in water 3.8 to 4 ft 8 in.feet deep depending upon activity.  Pt entered and exited the pool via stair and handrails.  Water walking for warm up forwards, and backwards and side stepping with blue DB  At edge of pool, pt performed LE exercise Heel raises - 2x20 Walking march with blue DB x2 laps Hamstring curl x20 Lt (Rt painful and ROM limited today) Hip circles CW/CCW x10 each BIL   Hip abd/add x20 BIL Runners Stretch x 30" BIL Hamstring stretch x 30" BIL Step ups on submerged step x10 fwd/lat BIL On submerged bench LAQ x1' Bicycle kicks x 1' Flutter kicks x 1' Scissor kicks x 1'  Pt requires the buoyancy of water for active assisted exercises with buoyancy supported for strengthening and AROM exercises. Hydrostatic pressure also supports joints by unweighting joint load by at least 50 % in 3-4 feet depth water. 80% in chest to neck deep water. Water will provide assistance with movement using the current and laminar flow while  the buoyancy reduces weight bearing. Pt requires the viscosity of the water for resistance with strengthening exercises.   [facility] Adult PT Treatment:                                                DATE: 02-17-22 Aquatic therapy at [address] - therapeutic pool temp 91 degrees in lap pool Pt  enters building via W/C and son accompanying. Treatment took place in water 3.8 to  4 ft 8 in.feet deep depending upon activity.  Pt entered and exited the pool via stair and handrails   Pain at initiation of RX  6/10  RT knee and LT 4/10    At end  RT 7/10 and LT 4/10  Aquatic Therapy:  Water walking for warm up forwards, and backwards and side stepping Aquastretch for BiIL quadriceps and medial knees BIL At edge of pool, pt performed LE exercise: all exercises below with resistive swimming ankle fins Heel raises - 2x20 Walking march Hamstring curl x20 BIL  Hip circles CW/CCW x10 BIL   Hip abd/add x20 BIL Runners Stretch x 30" x 2 BIL Hamstring stretch x 30" x 2 BIL Step ups on submerged step x10 fwd/lat BIL Step downs with heel tap RT on step x 10, LT on step 2 x 5 Monster walks laterally across pool x 3 15 ft each     Pt requires the buoyancy of water for active assisted exercises with buoyancy supported for strengthening and AROM exercises. Hydrostatic pressure also supports joints by unweighting joint load by at least 50 % in 3-4 feet depth water. 80% in chest to neck deep water. Water will provide assistance with movement using the current and laminar flow while the buoyancy reduces weight bearing. Pt requires the viscosity of the water for resistance with strengthening exercises.  [facility] Adult PT Treatment:                                                DATE: 02/12/2022 Aquatic therapy at [address] - therapeutic pool temp approximately 92 degrees. Pt enters building via W/C and son accompanying. Treatment took place in water 3.8 to  4 ft 8 in.feet deep  depending upon activity.  Pt entered and exited the pool via stair and handrails with step to pattern.  Aquatic Therapy:  Water walking for warm up forwards, and backwards and side stepping holding blue DB Therapeutic Exercise: Step ups on submerged step x10 fwd/lat BIL Walking march Lateral walking with DB push downs Fwd walking with DB push downs At edge of pool, pt performed LE exercise: Heel raises - 2x20 Hamstring curl x20 BIL  Hip circles CW/CCW x10 BIL   Hip abd/add x20 BIL   On submerged bench Bicycle kicks x 1' Reverse bicycle kicks x 1' Flutter kicks x 1' Scissor kicks x 1' Pt requires the buoyancy of water for active assisted exercises with buoyancy supported for strengthening and AROM exercises. Hydrostatic pressure also supports joints by unweighting joint load by at least 50 % in 3-4 feet depth water. 80% in chest to neck deep water. Water will provide assistance with movement using the current and laminar flow while the buoyancy reduces weight bearing. Pt requires the viscosity of the water for resistance with strengthening exercises.     ASSESSMENT:   CLINICAL IMPRESSION: Today's session focused on stretching of knees and ankles followed by strengthening as tolerated.  Incorporated TKE and abduction strengthening into session. Able to tolerate all tasks with only mild discomfort reported   Patient returns to OPPT for continue treatment to B knees due to underlying pain from OA.  She has been offered B TKA once BMI is in acceptable range.  Patient presents with ROM and strength  deficits, limited ambulation tolerance due to pain, weakness and loss of endurance.  Patient uses power chair as main means of transportation/mobility.  Sh is able to ambulate household distances with single cane and furniture surfing.  Patient is a good candidate for continued OPPT consisting of land based PT initially to establish a concise HEP following by a short episode of aquatic PT to  establish an independent program using one of the local pool facilities.    OBJECTIVE IMPAIRMENTS: Abnormal gait, decreased activity tolerance, decreased balance, decreased endurance, decreased knowledge of condition, decreased knowledge of use of DME, decreased mobility, difficulty walking, decreased ROM, decreased strength, obesity, and pain.    ACTIVITY LIMITATIONS: carrying, lifting, standing, squatting, stairs, and transfers   PERSONAL FACTORS: Age, Fitness, Past/current experiences, and Time since onset of injury/illness/exacerbation are also affecting patient's functional outcome.    REHAB POTENTIAL: Fair based on chronicity, unsuccessful conservative interventions and advanced OA of B knees   CLINICAL DECISION MAKING: Stable/uncomplicated   EVALUATION COMPLEXITY: Low     GOALS: Goals reviewed with patient? No   SHORT TERM GOALS: Target date: 02/03/2022   Patient to demonstrate independence in HEP  Baseline:[email or web address] Goal status: Ongoing Pt reports non-compliance 02/12/22   2.  Initiate aquatic program Baseline: TBD Goal status: Met Initiated      LONG TERM GOALS: Target date: 06/22/2022     Increase BLE strength to 4-/5 in deficit areas Baseline:  MMT Right eval Left eval R 04/22/22 L 04/22/22  Hip flexion 3 3 3+ 3+  Hip extension        Hip abduction 3 3 3+ 3+  Hip adduction        Hip internal rotation        Hip external rotation        Knee flexion 3+ 3+ 3+ 3+  Knee extension 3+ 3+ 3+ 3+  Ankle dorsiflexion        Ankle plantarflexion 3+ 3+ UTA UTA    Goal status: INITIAL   2.  Increase FOTO score to 56 Baseline: 21 Goal status: INITIAL   3.  Increase B knee extension AROM to -10d B and flexion to 115d B Baseline:    R 01/20/22  L 01/20/22 R 04/22/22 L 04/22/22  Knee flexion 92d 100d 110d 95d  Knee extension -25d -25 -40/-35d -30/-45d   Goal status: INITIAL  4. Increase ambulation distance to 162ft with single cane  Baseline: 33ft with B  canes and SBA  Goal Status: Initial  5. Increase 30 second chair stand reps to 6 with UE support  Baseline: 4 with UE support  Goal Status: Initial       PLAN:   PT FREQUENCY: 1-2x/week   PT DURATION: 4 weeks   PLANNED INTERVENTIONS: Therapeutic exercises, Therapeutic activity, Neuromuscular re-education, Balance training, Gait training, Patient/Family education, Self Care, Joint mobilization, Stair training, DME instructions, and Re-evaluation   PLAN FOR NEXT SESSION: HEP review and update, ROM, strengthening, gait training and functional activities  [facility]er[facility] PT  04/27/22 6:24 PM Phone: [phone] Fax: [phone]

## 2022-04-27 ENCOUNTER — Ambulatory Visit: Payer: 59 | Attending: Family Medicine

## 2022-04-27 DIAGNOSIS — M25561 Pain in right knee: Secondary | ICD-10-CM | POA: Diagnosis not present

## 2022-04-27 DIAGNOSIS — G8929 Other chronic pain: Secondary | ICD-10-CM | POA: Diagnosis not present

## 2022-04-27 DIAGNOSIS — M25562 Pain in left knee: Secondary | ICD-10-CM | POA: Diagnosis not present

## 2022-04-27 DIAGNOSIS — M6281 Muscle weakness (generalized): Secondary | ICD-10-CM | POA: Insufficient documentation

## 2022-04-28 NOTE — Therapy (Unsigned)
OUTPATIENT PHYSICAL THERAPY TREATMENT NOTE/PROGRESS NOTE  Patient Name: [name] MRN: [id number] DOB:July 29, 1965, 57 y.o., female Today's Date: 04/28/2022  PCP: Ezequiel Essex, MD    REFERRING PROVIDER: Gentry Fitz, MD       Past Medical History:  Diagnosis Date   ALLERGIC RHINITIS    takes Zyrtec daily   Anxiety    Arthritis    Asthma    Albuterol prn;Symbicort daily and Singulair daily   Bronchitis    hx of   CAP (community acquired pneumonia) 02/20/2015   Carpal tunnel syndrome    right   Chronic back pain    Constipation    Miralax prn   Depression    takes Cymbalta daily   Diabetes    type 2    Eczema    Gallstones 2010   GERD (gastroesophageal reflux disease)    takes Omeprazole daily   Hemorrhoids    Hypertension    takes Prinizide daily and Clonidine on Mondays   Hypoventilation associated with obesity syndrome    Insomnia    Irritable bladder    Joint pain    Morbid (severe) obesity due to excess calories 04/10/2007   Restrictive changes on pfts 11/30/2007     Myasthenia gravis 1994   Positive Acetylcholine receptor Ab and single fiber EMG Dr. Barbaraann Share [facility] 1996- Dr. 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 Chesley Mires- 10/30/2007 ANA positive, ANA titer  negative, RF less than 20   Rheumatoid arthritis(714.0)    Sarcoidosis 12/23/2010   Derm  dx NCG  12/03/10 (path report in EPIC)    Overview:  Overview:  Diagnosed on skin biopsy November 2012  Last Assessment & Plan:  Labs are unremarkable. CXR improved/stable. PFT wnl. I am happy patient had these tests and  we have a baseline for her. At this time, I do not see an indication for starting chronic steroids daily. Patient agrees. Her skin lesions are bothering her the most and syst   Shortness of breath    can be sitting as well as exertion   Skin irritation    skin itchy   Trigger finger    Urinary frequency    takes Ditropan daily   Viral meningitis    history of viral meningitis   Past Surgical History:  Procedure Laterality Date   BRONCHOSCOPY  08/2007   CARPAL TUNNEL RELEASE  05/20/2011   Procedure: CARPAL TUNNEL RELEASE;  Surgeon: Nita Sells, MD;  Location: [facility];  Service: Orthopedics;  Laterality: Right;   CARPAL TUNNEL RELEASE Right 11/22/2019   Procedure: RIGHT HAND CARPAL TUNNEL RELEASE;  Surgeon: Tania Ade, MD;  Location: [facility];  Service: Orthopedics;  Laterality: Right;   CARPAL TUNNEL RELEASE Left 02/25/2022   Procedure: CARPAL TUNNEL RELEASE;  Surgeon: Tania Ade, MD;  Location: [facility];  Service: Orthopedics;  Laterality: Left;   CESAREAN SECTION  09/15/2004   COLONOSCOPY N/A 01/21/2014   Procedure: COLONOSCOPY;  Surgeon: Gatha Mayer, MD;  Location: [facility];  Service: Endoscopy;  Laterality: N/A;   cortisone  injection     receives an injection every 86months   ESOPHAGOGASTRODUODENOSCOPY     ESOPHAGOGASTRODUODENOSCOPY N/A 01/21/2014   Procedure: ESOPHAGOGASTRODUODENOSCOPY (EGD);  Surgeon: Gatha Mayer, MD;  Location: [facility];  Service: Endoscopy;  Laterality: N/A;   LAPAROSCOPIC CHOLECYSTECTOMY  07/2008   by Dr. Neldon Mc   Thymus resection  08/25/1993   TRIGGER FINGER RELEASE  05/20/2011   Procedure: RELEASE TRIGGER FINGER/A-1 PULLEY;  Surgeon: Nita Sells, MD;  Location: [facility];  Service: Orthopedics;  Laterality: Right;  RIGHT TRIGGER THUMB RELEASE AND RIGHT CARPAL TUNNEL RELEASE   Patient Active Problem List   Diagnosis Date Noted   Weight loss advised 03/07/2022   Drug-induced constipation 12/09/2021   Anxiety  09/23/2021   Chronic right shoulder pain 03/24/2021   Left wrist pain 03/24/2021   Diabetic neuropathy 02/25/2021   Healthcare maintenance 10/21/2020   Hyperlipidemia (primary prevention, LDL goal <70) 09/04/2020   Wheelchair dependence 05/05/2020   Carpal tunnel syndrome of right wrist, RECURRENT 10/15/2019   Insomnia, idiopathic 09/30/2018   Grade I diastolic dysfunction [id number]   Encounter for chronic pain management 02/11/2014   Diabetes 02/11/2014   Cystocele 09/14/2012   Chronic pain of both knees 05/28/2010   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   Allergic rhinitis 05/30/2008   Carpal tunnel syndrome of left wrist 01/17/2008   OBESITY HYPOVENTILATION SYNDROME 12/27/2007   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   Rationale for Evaluation and Treatment Rehabilitation  REFERRING DIAG: BILATERAL KNEE OSTEOARTHRITIS :HIP MUSCLE ; KNEE FLEXION   PERTINENT HISTORY:  HPI Patient is 57 year old female comes in today with bilateral knee pain second opinion.  She has been told that she has arthritis in both knees.  She was seen at [place] recently and told she needed to work on strengthening her legs and they have actually ordered therapy.  They also discussed with her that she needed to work on weight loss which she is doing.  She states she has had bilateral knee pain for some 30 years.  No known injury.  Radiographs in 2019 are available and show severe tricompartmental arthritis of the right knee.  She does take diclofenac Tylenol Lyrica and oxycodone for her knee pain but states this really helped.  She is also tried injections both cortisone and gel injections  without any real relief.  She notes that she has been basically dependent upon a motorized wheelchair for the last 2 years.  She does ambulate short distances though.  She states prior to the motorized wheelchair she was walking with a walker.  She is lost from 430 pounds down to 316 pounds.  States due to her myasthenia gravis she has been on prednisone.  Has gained a lot of weight over the years.  She has also had weakness in the extremities for years.  Reports that her diabetes is under good control at this point in time.  PRECAUTIONS/RESTRICTIONS:   FALL  SUBJECTIVE: No changes to report.  Has forgotten to perform HEP   PAIN:  Are you having pain? Yes: NPRS scale: 8/10 (L>R) Pain location: B knees Pain description: ache Aggravating factors: everything Relieving factors: nothing  OBJECTIVE: (objective measures completed at initial evaluation unless otherwise dated)  DIAGNOSTIC  FINDINGS: CLINICAL DATA:  57 year old female with a history of chronic right knee pain   EXAM: RIGHT KNEE 3 VIEWS   COMPARISON:  08/03/2013   FINDINGS: No acute displaced fracture. Advanced joint space narrowing of the mediolateral compartment with marginal osteophyte formation, sclerotic changes. Degenerative changes of the patellofemoral joint. Changes have not progressed since the comparison of 08/03/2013   IMPRESSION: Advanced tricompartmental osteoarthritis, worse than the comparison study of 2015.   No acute bony abnormality identified.     Electronically Signed   By: Corrie Mckusick D.O.   On: 11/09/2017 09:34     PATIENT SURVEYS:  FOTO 21(56 predicted)    COGNITION: Overall cognitive status: Within functional limits for tasks assessed                         SENSATION: Not tested   MUSCLE LENGTH: UTA due to pain levels   POSTURE:  UTA due to poor standing tolerance   PALPATION: deferred   LOWER EXTREMITY ROM:   Active ROM Right eval Left eval R 04/22/22 L  04/22/22  Hip  flexion        Hip extension        Hip abduction        Hip adduction        Hip internal rotation        Hip external rotation        Knee flexion 92d 100d 110d 95d  Knee extension -25d -25 -40/-35d -30/-45d  Ankle dorsiflexion        Ankle plantarflexion        Ankle inversion        Ankle eversion         (Blank rows = not tested)   LOWER EXTREMITY MMT:   MMT Right eval Left eval R 04/22/22 L 04/22/22  Hip flexion 3 3 3+ 3+  Hip extension        Hip abduction 3 3 3+ 3+  Hip adduction        Hip internal rotation        Hip external rotation        Knee flexion 3+ 3+ 3+ 3+  Knee extension 3+ 3+ 3+ 3+  Ankle dorsiflexion        Ankle plantarflexion 3+ 3+ UTA UTA  Ankle inversion        Ankle eversion         PF strength testing deferred due to B achilles pain.   LOWER EXTREMITY SPECIAL TESTS:  UTA due to pain and limited tolerance to testing positions    FUNCTIONAL TESTS:  30 seconds chair stand test 0 reps 04/22/22 30s chair stand test 4 reps with UE support   GAIT: Distance walked: 45ft Assistive device utilized: Wheelchair (power) Level of assistance: Complete Independence Comments: Patient using power chair for mobility 04/22/22 63ft with SBA and B canes      PATIENT EDUCATION:  Education details: Discussed eval findings, rehab rationale and POC and patient is in agreement  Person educated: Patient Education method: Explanation Education comprehension: verbalized understanding and needs further education   HOME EXERCISE PROGRAM: AAccess Code: [email or web address] URL: [email or web address] Date: 04/22/2022 Prepared by: Sharlynn Oliphant  Exercises - Seated Long Arc Quad  - 3 x daily - 5 x weekly - 2 sets - 10 reps - Seated Hamstring Stretch  - 3 x daily - 5 x weekly - 1 sets - 2 reps - 30s hold -  Seated Heel Slide  - 3 x daily - 5 x weekly - 2 sets - 10 reps - 2s hold  [facility] Adult PT Treatment:                                                DATE:  04/29/22 Therapeutic Exercise: Seated hamstring stretch on mat 30s x2 B Seated gastroc stretch with strap 30s x2 B FAQs 2# 15x2 B Seated ham curls 15x2 B YTB Supine SLR 15x B Supine SAQs 15x2 B 2# Heel slides 15x B Supine hip fallouts RTB 15x B, 15/15 unilaterally  [facility] Adult PT Treatment:                                                DATE: 04/27/22 Therapeutic Exercise: Seated hamstring stretch over stool 30s x2 B Seated gastroc stretch with towel 30s x2 B FAQs 2# 15x2 B Seated ham curls 15x2 B YTB Supine SLR 15x B Supine SAQs 15x2 B Heel slides with strap 15x B Supine hip fallouts RTB 15x B, 15/15 unilaterally   [facility] Adult PT Treatment:                                                DATE: 04/22/22 Therapeutic Exercise: Seated heel slides 10x B Seated FAQs 10x B Seated hamstring stretch 30s x2 B Gait 48ft with B canes and SBA   [facility] Adult PT Treatment:                                                DATE: 02/19/2022 Aquatic therapy at [address] - therapeutic pool temp 91 degrees in lap pool Pt enters building via W/C and son accompanying. Treatment took place in water 3.8 to 4 ft 8 in.feet deep depending upon activity.  Pt entered and exited the pool via stair and handrails.  Water walking for warm up forwards, and backwards and side stepping with blue DB  At edge of pool, pt performed LE exercise Heel raises - 2x20 Walking march with blue DB x2 laps Hamstring curl x20 Lt (Rt painful and ROM limited today) Hip circles CW/CCW x10 each BIL   Hip abd/add x20 BIL Runners Stretch x 30" BIL Hamstring stretch x 30" BIL Step ups on submerged step x10 fwd/lat BIL On submerged bench LAQ x1' Bicycle kicks x 1' Flutter kicks x 1' Scissor kicks x 1'  Pt requires the buoyancy of water for active assisted exercises with buoyancy supported for strengthening and AROM exercises. Hydrostatic pressure also supports joints by unweighting joint load by at least 50 % in  3-4 feet depth water. 80% in chest to neck deep water. Water will provide assistance with movement using the current and laminar flow while the buoyancy reduces weight bearing. Pt requires the viscosity of the water for resistance with strengthening exercises.   [facility] Adult PT Treatment:  DATE: 02-17-22 Aquatic therapy at [address] - therapeutic pool temp 91 degrees in lap pool Pt enters building via W/C and son accompanying. Treatment took place in water 3.8 to  4 ft 8 in.feet deep depending upon activity.  Pt entered and exited the pool via stair and handrails   Pain at initiation of RX  6/10  RT knee and LT 4/10    At end  RT 7/10 and LT 4/10  Aquatic Therapy:  Water walking for warm up forwards, and backwards and side stepping Aquastretch for BiIL quadriceps and medial knees BIL At edge of pool, pt performed LE exercise: all exercises below with resistive swimming ankle fins Heel raises - 2x20 Walking march Hamstring curl x20 BIL  Hip circles CW/CCW x10 BIL   Hip abd/add x20 BIL Runners Stretch x 30" x 2 BIL Hamstring stretch x 30" x 2 BIL Step ups on submerged step x10 fwd/lat BIL Step downs with heel tap RT on step x 10, LT on step 2 x 5 Monster walks laterally across pool x 3 15 ft each     Pt requires the buoyancy of water for active assisted exercises with buoyancy supported for strengthening and AROM exercises. Hydrostatic pressure also supports joints by unweighting joint load by at least 50 % in 3-4 feet depth water. 80% in chest to neck deep water. Water will provide assistance with movement using the current and laminar flow while the buoyancy reduces weight bearing. Pt requires the viscosity of the water for resistance with strengthening exercises.  [facility] Adult PT Treatment:                                                DATE: 02/12/2022 Aquatic therapy at [address] - therapeutic pool temp  approximately 92 degrees. Pt enters building via W/C and son accompanying. Treatment took place in water 3.8 to  4 ft 8 in.feet deep depending upon activity.  Pt entered and exited the pool via stair and handrails with step to pattern.  Aquatic Therapy:  Water walking for warm up forwards, and backwards and side stepping holding blue DB Therapeutic Exercise: Step ups on submerged step x10 fwd/lat BIL Walking march Lateral walking with DB push downs Fwd walking with DB push downs At edge of pool, pt performed LE exercise: Heel raises - 2x20 Hamstring curl x20 BIL  Hip circles CW/CCW x10 BIL   Hip abd/add x20 BIL   On submerged bench Bicycle kicks x 1' Reverse bicycle kicks x 1' Flutter kicks x 1' Scissor kicks x 1' Pt requires the buoyancy of water for active assisted exercises with buoyancy supported for strengthening and AROM exercises. Hydrostatic pressure also supports joints by unweighting joint load by at least 50 % in 3-4 feet depth water. 80% in chest to neck deep water. Water will provide assistance with movement using the current and laminar flow while the buoyancy reduces weight bearing. Pt requires the viscosity of the water for resistance with strengthening exercises.     ASSESSMENT:   CLINICAL IMPRESSION: Sore following last session, a little carrying over today.  Repeated previous exercise program w/o change in reps or resistance to allow knees to accommodate to PREs.     Patient returns to OPPT for continue treatment to B knees due to underlying pain from OA.  She has been  offered B TKA once BMI is in acceptable range.  Patient presents with ROM and strength deficits, limited ambulation tolerance due to pain, weakness and loss of endurance.  Patient uses power chair as main means of transportation/mobility.  Sh is able to ambulate household distances with single cane and furniture surfing.  Patient is a good candidate for continued OPPT consisting of land based PT  initially to establish a concise HEP following by a short episode of aquatic PT to establish an independent program using one of the local pool facilities.    OBJECTIVE IMPAIRMENTS: Abnormal gait, decreased activity tolerance, decreased balance, decreased endurance, decreased knowledge of condition, decreased knowledge of use of DME, decreased mobility, difficulty walking, decreased ROM, decreased strength, obesity, and pain.    ACTIVITY LIMITATIONS: carrying, lifting, standing, squatting, stairs, and transfers   PERSONAL FACTORS: Age, Fitness, Past/current experiences, and Time since onset of injury/illness/exacerbation are also affecting patient's functional outcome.    REHAB POTENTIAL: Fair based on chronicity, unsuccessful conservative interventions and advanced OA of B knees   CLINICAL DECISION MAKING: Stable/uncomplicated   EVALUATION COMPLEXITY: Low     GOALS: Goals reviewed with patient? No   SHORT TERM GOALS: Target date: 02/03/2022   Patient to demonstrate independence in HEP  Baseline:[email or web address] Goal status: Ongoing Pt reports non-compliance 02/12/22   2.  Initiate aquatic program Baseline: TBD Goal status: Met Initiated      LONG TERM GOALS: Target date: 06/22/2022     Increase BLE strength to 4-/5 in deficit areas Baseline:  MMT Right eval Left eval R 04/22/22 L 04/22/22  Hip flexion 3 3 3+ 3+  Hip extension        Hip abduction 3 3 3+ 3+  Hip adduction        Hip internal rotation        Hip external rotation        Knee flexion 3+ 3+ 3+ 3+  Knee extension 3+ 3+ 3+ 3+  Ankle dorsiflexion        Ankle plantarflexion 3+ 3+ UTA UTA    Goal status: INITIAL   2.  Increase FOTO score to 56 Baseline: 21 Goal status: INITIAL   3.  Increase B knee extension AROM to -10d B and flexion to 115d B Baseline:    R 01/20/22  L 01/20/22 R 04/22/22 L 04/22/22  Knee flexion 92d 100d 110d 95d  Knee extension -25d -25 -40/-35d -30/-45d   Goal status: INITIAL  4.  Increase ambulation distance to 177ft with single cane  Baseline: 63ft with B canes and SBA  Goal Status: Initial  5. Increase 30 second chair stand reps to 6 with UE support  Baseline: 4 with UE support  Goal Status: Initial       PLAN:   PT FREQUENCY: 1-2x/week   PT DURATION: 4 weeks   PLANNED INTERVENTIONS: Therapeutic exercises, Therapeutic activity, Neuromuscular re-education, Balance training, Gait training, Patient/Family education, Self Care, Joint mobilization, Stair training, DME instructions, and Re-evaluation   PLAN FOR NEXT SESSION: HEP review and update, ROM, strengthening, gait training and functional activities  Leroy Sea PT  04/28/22 11:45 AM Phone: [phone] Fax: [phone]

## 2022-04-29 ENCOUNTER — Ambulatory Visit: Payer: 59

## 2022-04-29 DIAGNOSIS — G8929 Other chronic pain: Secondary | ICD-10-CM

## 2022-04-29 DIAGNOSIS — M25561 Pain in right knee: Secondary | ICD-10-CM | POA: Diagnosis not present

## 2022-04-29 DIAGNOSIS — M6281 Muscle weakness (generalized): Secondary | ICD-10-CM

## 2022-04-29 DIAGNOSIS — M25562 Pain in left knee: Secondary | ICD-10-CM | POA: Diagnosis not present

## 2022-04-30 ENCOUNTER — Telehealth: Payer: Self-pay | Admitting: Family Medicine

## 2022-04-30 NOTE — Telephone Encounter (Signed)
Contacted Manaia A Philipson to schedule their annual wellness visit. Appointment made for 05/04/2022.  Thank you,  [facility] Support [facility] Direct dial  [phone]

## 2022-05-03 ENCOUNTER — Other Ambulatory Visit: Payer: Self-pay | Admitting: Family Medicine

## 2022-05-03 ENCOUNTER — Other Ambulatory Visit (HOSPITAL_COMMUNITY): Payer: Self-pay | Admitting: Surgical

## 2022-05-03 DIAGNOSIS — I10 Essential (primary) hypertension: Secondary | ICD-10-CM

## 2022-05-03 DIAGNOSIS — M174 Other bilateral secondary osteoarthritis of knee: Secondary | ICD-10-CM

## 2022-05-03 DIAGNOSIS — E1165 Type 2 diabetes mellitus with hyperglycemia: Secondary | ICD-10-CM

## 2022-05-03 DIAGNOSIS — G8929 Other chronic pain: Secondary | ICD-10-CM

## 2022-05-03 DIAGNOSIS — E0810 Diabetes mellitus due to underlying condition with ketoacidosis without coma: Secondary | ICD-10-CM

## 2022-05-04 ENCOUNTER — Other Ambulatory Visit (HOSPITAL_COMMUNITY): Payer: Self-pay

## 2022-05-04 ENCOUNTER — Other Ambulatory Visit: Payer: Self-pay

## 2022-05-04 ENCOUNTER — Ambulatory Visit: Payer: 59

## 2022-05-04 MED ORDER — DEXCOM G6 TRANSMITTER MISC
3 refills | Status: DC
  Filled 2022-05-04: qty 1, 90d supply, fill #0
  Filled 2022-07-30: qty 1, 90d supply, fill #1

## 2022-05-04 MED ORDER — LANTUS SOLOSTAR 100 UNIT/ML ~~LOC~~ SOPN
40.0000 [IU] | PEN_INJECTOR | Freq: Every day | SUBCUTANEOUS | 6 refills | Status: DC
  Filled 2022-05-04: qty 15, 37d supply, fill #0
  Filled 2022-06-08: qty 15, 37d supply, fill #1

## 2022-05-04 MED ORDER — DICLOFENAC SODIUM 50 MG PO TBEC
50.0000 mg | DELAYED_RELEASE_TABLET | Freq: Two times a day (BID) | ORAL | 0 refills | Status: DC | PRN
  Filled 2022-05-04: qty 60, 30d supply, fill #0

## 2022-05-04 MED ORDER — OXYBUTYNIN CHLORIDE 5 MG PO TABS
5.0000 mg | ORAL_TABLET | Freq: Two times a day (BID) | ORAL | 1 refills | Status: DC
  Filled 2022-05-04 – 2022-05-24 (×2): qty 60, 30d supply, fill #0
  Filled 2022-06-22: qty 60, 30d supply, fill #1

## 2022-05-05 ENCOUNTER — Other Ambulatory Visit (HOSPITAL_COMMUNITY): Payer: Self-pay

## 2022-05-10 NOTE — Therapy (Unsigned)
OUTPATIENT PHYSICAL THERAPY TREATMENT NOTE/PROGRESS NOTE  Patient Name: [name] MRN: [id number] DOB:08-10-1965, 57 y.o., female Today's Date: 05/10/2022  PCP: Fayette Pho, MD    REFERRING PROVIDER: Otho Darner, MD       Past Medical History:  Diagnosis Date   ALLERGIC RHINITIS    takes Zyrtec daily   Anxiety    Arthritis    Asthma    Albuterol prn;Symbicort daily and Singulair daily   Bronchitis    hx of   CAP (community acquired pneumonia) 02/20/2015   Carpal tunnel syndrome    right   Chronic back pain    Constipation    Miralax prn   Depression    takes Cymbalta daily   Diabetes    type 2    Eczema    Gallstones 2010   GERD (gastroesophageal reflux disease)    takes Omeprazole daily   Hemorrhoids    Hypertension    takes Prinizide daily and Clonidine on Mondays   Hypoventilation associated with obesity syndrome    Insomnia    Irritable bladder    Joint pain    Morbid (severe) obesity due to excess calories 04/10/2007   Restrictive changes on pfts 11/30/2007     Myasthenia gravis 1994   Positive Acetylcholine receptor Ab and single fiber EMG Dr. Clarisa [facility]- Dr. 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 CXR improved/stable. PFT wnl. I am happy patient had these tests  and we have a baseline for her. At this time, I do not see an indication for starting chronic steroids daily. Patient agrees. Her skin lesions are bothering her the most and syst   Shortness of breath    can be sitting as well as exertion   Skin irritation    skin itchy   Trigger finger    Urinary frequency    takes Ditropan daily   Viral meningitis    history of viral meningitis   Past Surgical History:  Procedure Laterality Date   BRONCHOSCOPY  08/2007   CARPAL TUNNEL RELEASE  05/20/2011   Procedure: CARPAL TUNNEL RELEASE;  Surgeon: [name], MD;  Location: [facility] OR;  Service: Orthopedics;  Laterality: Right;   CARPAL TUNNEL RELEASE Right 11/22/2019   Procedure: RIGHT HAND CARPAL TUNNEL RELEASE;  Surgeon: Jones Broom, MD;  Location: [facility] ORS;  Service: Orthopedics;  Laterality: Right;   CARPAL TUNNEL RELEASE Left 02/25/2022   Procedure: CARPAL TUNNEL RELEASE;  Surgeon: Jones Broom, MD;  Location: [facility] ORS;  Service: Orthopedics;  Laterality: Left;   CESAREAN SECTION  09/15/2004   COLONOSCOPY N/A 01/21/2014   Procedure: COLONOSCOPY;  Surgeon: Iva Boop, MD;  Location: [facility] ENDOSCOPY;  Service: Endoscopy;  Laterality: N/A;   cortisone  injection     receives an injection every 3months   ESOPHAGOGASTRODUODENOSCOPY     ESOPHAGOGASTRODUODENOSCOPY N/A 01/21/2014   Procedure: ESOPHAGOGASTRODUODENOSCOPY (EGD);  Surgeon: Iva Boop, MD;  Location: [facility];  Service: Endoscopy;  Laterality: N/A;   LAPAROSCOPIC CHOLECYSTECTOMY  07/2008   by Dr. Cyndia Bent   Thymus resection  08/25/1993   TRIGGER FINGER RELEASE  05/20/2011   Procedure: RELEASE TRIGGER FINGER/A-1 PULLEY;  Surgeon: [name], MD;  Location: [facility] OR;  Service: Orthopedics;  Laterality: Right;  RIGHT TRIGGER THUMB RELEASE AND RIGHT CARPAL TUNNEL RELEASE   Patient Active Problem List   Diagnosis Date Noted   Weight loss advised 03/07/2022   Drug-induced constipation 12/09/2021    Anxiety 09/23/2021   Chronic right shoulder pain 03/24/2021   Left wrist pain 03/24/2021   Diabetic neuropathy 02/25/2021   Healthcare maintenance 10/21/2020   Hyperlipidemia (primary prevention, LDL goal <70) 09/04/2020   Wheelchair dependence 05/05/2020   Carpal tunnel syndrome of right wrist, RECURRENT 10/15/2019   Insomnia, idiopathic 09/30/2018   Grade I diastolic dysfunction 09/30/2018   Encounter for chronic pain management 02/11/2014   Diabetes 02/11/2014   Cystocele 09/14/2012   Chronic pain of both knees 05/28/2010   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   Allergic rhinitis 05/30/2008   Carpal tunnel syndrome of left wrist 01/17/2008   OBESITY HYPOVENTILATION SYNDROME 12/27/2007   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   Rationale for Evaluation and Treatment Rehabilitation  REFERRING DIAG: BILATERAL KNEE OSTEOARTHRITIS :HIP MUSCLE ; KNEE FLEXION   PERTINENT HISTORY:  HPI Patient is 57 year old female comes in today with bilateral knee pain second opinion.  She has been told that she has arthritis in both knees.  She was seen at [facility] orthopedics recently and told she needed to work on strengthening her legs and they have actually ordered therapy.  They also discussed with her that she needed to work on weight loss which she is doing.  She states she has had bilateral knee pain for some 30 years.  No known injury.  Radiographs in 2019 are available and show severe tricompartmental arthritis of the right knee.  She does take diclofenac Tylenol Lyrica and oxycodone for her knee pain but states this really helped.  She is also tried injections both cortisone and gel  injections without any real relief.  She notes that she has been basically dependent upon a motorized wheelchair for the last 2 years.  She does ambulate short distances though.  She states prior to the motorized wheelchair she was walking with a walker.  She is lost from 430 pounds down to 316 pounds.  States due to her myasthenia gravis she has been on prednisone.  Has gained a lot of weight over the years.  She has also had weakness in the extremities for years.  Reports that her diabetes is under good control at this point in time.  PRECAUTIONS/RESTRICTIONS:   FALL  SUBJECTIVE: No changes to report.  Has forgotten to perform HEP   PAIN:  Are you having pain? Yes: NPRS scale: 8/10 (L>R) Pain location: B knees Pain description: ache Aggravating factors: everything Relieving factors: nothing  OBJECTIVE: (objective measures completed at initial evaluation unless otherwise dated)  DIAGNOSTIC  FINDINGS: CLINICAL DATA:  57 year old female with a history of chronic right knee pain   EXAM: RIGHT KNEE 3 VIEWS   COMPARISON:  08/03/2013   FINDINGS: No acute displaced fracture. Advanced joint space narrowing of the mediolateral compartment with marginal osteophyte formation, sclerotic changes. Degenerative changes of the patellofemoral joint. Changes have not progressed since the comparison of 08/03/2013   IMPRESSION: Advanced tricompartmental osteoarthritis, worse than the comparison study of 2015.   No acute bony abnormality identified.     Electronically Signed   By: Gilmer Mor D.O.   On: 11/09/2017 09:34     PATIENT SURVEYS:  FOTO 21(56 predicted)    COGNITION: Overall cognitive status: Within functional limits for tasks assessed                         SENSATION: Not tested   MUSCLE LENGTH: UTA due to pain levels   POSTURE:  UTA due to poor standing tolerance   PALPATION: deferred   LOWER EXTREMITY ROM:   Active ROM Right eval Left eval R 04/22/22 L   04/22/22  Hip flexion        Hip extension        Hip abduction        Hip adduction        Hip internal rotation        Hip external rotation        Knee flexion 92d 100d 110d 95d  Knee extension -25d -25 -40/-35d -30/-45d  Ankle dorsiflexion        Ankle plantarflexion        Ankle inversion        Ankle eversion         (Blank rows = not tested)   LOWER EXTREMITY MMT:   MMT Right eval Left eval R 04/22/22 L 04/22/22  Hip flexion 3 3 3+ 3+  Hip extension        Hip abduction 3 3 3+ 3+  Hip adduction        Hip internal rotation        Hip external rotation        Knee flexion 3+ 3+ 3+ 3+  Knee extension 3+ 3+ 3+ 3+  Ankle dorsiflexion        Ankle plantarflexion 3+ 3+ UTA UTA  Ankle inversion        Ankle eversion         PF strength testing deferred due to B achilles pain.   LOWER EXTREMITY SPECIAL TESTS:  UTA due to pain and limited tolerance to testing positions    FUNCTIONAL TESTS:  30 seconds chair stand test 0 reps 04/22/22 30s chair stand test 4 reps with UE support   GAIT: Distance walked: 56ft Assistive device utilized: Wheelchair (power) Level of assistance: Complete Independence Comments: Patient using power chair for mobility 04/22/22 34ft with SBA and B canes      PATIENT EDUCATION:  Education details: Discussed eval findings, rehab rationale and POC and patient is in agreement  Person educated: Patient Education method: Explanation Education comprehension: verbalized understanding and needs further education   HOME EXERCISE PROGRAM: AAccess Code: [email or web address] URL: [email or web address] Date: 04/22/2022 Prepared by: Gustavus Bryant  Exercises - Seated Long Arc Quad  - 3 x daily - 5 x weekly - 2 sets - 10 reps - Seated Hamstring Stretch  - 3 x daily - 5 x weekly - 1 sets - 2 reps - 30s hold -  Seated Heel Slide  - 3 x daily - 5 x weekly - 2 sets - 10 reps - 2s hold  [facility] Adult PT Treatment:                                                 DATE: 04/29/22 Therapeutic Exercise: Seated hamstring stretch on mat 30s x2 B Seated gastroc stretch with strap 30s x2 B FAQs 2# 15x2 B Seated ham curls 15x2 B YTB Supine SLR 15x B Supine SAQs 15x2 B 2# Heel slides 15x B Supine hip fallouts RTB 15x B, 15/15 unilaterally  [facility] Adult PT Treatment:                                                DATE: 04/27/22 Therapeutic Exercise: Seated hamstring stretch over stool 30s x2 B Seated gastroc stretch with towel 30s x2 B FAQs 2# 15x2 B Seated ham curls 15x2 B YTB Supine SLR 15x B Supine SAQs 15x2 B Heel slides with strap 15x B Supine hip fallouts RTB 15x B, 15/15 unilaterally   [facility] Adult PT Treatment:                                                DATE: 04/22/22 Therapeutic Exercise: Seated heel slides 10x B Seated FAQs 10x B Seated hamstring stretch 30s x2 B Gait 74ft with B canes and SBA   [facility] Adult PT Treatment:                                                DATE: 02/19/2022 Aquatic therapy at [facility] [address] - therapeutic pool temp 91 degrees in lap pool Pt enters building via W/C and son accompanying. Treatment took place in water 3.8 to 4 ft 8 in.feet deep depending upon activity.  Pt entered and exited the pool via stair and handrails.  Water walking for warm up forwards, and backwards and side stepping with blue DB  At edge of pool, pt performed LE exercise Heel raises - 2x20 Walking march with blue DB x2 laps Hamstring curl x20 Lt (Rt painful and ROM limited today) Hip circles CW/CCW x10 each BIL   Hip abd/add x20 BIL Runners Stretch x 30" BIL Hamstring stretch x 30" BIL Step ups on submerged step x10 fwd/lat BIL On submerged bench LAQ x1' Bicycle kicks x 1' Flutter kicks x 1' Scissor kicks x 1'  Pt requires the buoyancy of water for active assisted exercises with buoyancy supported for strengthening and AROM exercises. Hydrostatic pressure also supports joints by unweighting joint load by at  least 50 % in 3-4 feet depth water. 80% in chest to neck deep water. Water will provide assistance with movement using the current and laminar flow while the buoyancy reduces weight bearing. Pt requires the viscosity of the water for resistance with strengthening exercises.   [facility] Adult PT Treatment:  DATE: 02-17-22 Aquatic therapy at [facility] [address] - therapeutic pool temp 91 degrees in lap pool Pt enters building via W/C and son accompanying. Treatment took place in water 3.8 to  4 ft 8 in.feet deep depending upon activity.  Pt entered and exited the pool via stair and handrails   Pain at initiation of RX  6/10  RT knee and LT 4/10    At end  RT 7/10 and LT 4/10  Aquatic Therapy:  Water walking for warm up forwards, and backwards and side stepping Aquastretch for BiIL quadriceps and medial knees BIL At edge of pool, pt performed LE exercise: all exercises below with resistive swimming ankle fins Heel raises - 2x20 Walking march Hamstring curl x20 BIL  Hip circles CW/CCW x10 BIL   Hip abd/add x20 BIL Runners Stretch x 30" x 2 BIL Hamstring stretch x 30" x 2 BIL Step ups on submerged step x10 fwd/lat BIL Step downs with heel tap RT on step x 10, LT on step 2 x 5 Monster walks laterally across pool x 3 15 ft each     Pt requires the buoyancy of water for active assisted exercises with buoyancy supported for strengthening and AROM exercises. Hydrostatic pressure also supports joints by unweighting joint load by at least 50 % in 3-4 feet depth water. 80% in chest to neck deep water. Water will provide assistance with movement using the current and laminar flow while the buoyancy reduces weight bearing. Pt requires the viscosity of the water for resistance with strengthening exercises.  [facility] Adult PT Treatment:                                                DATE: 02/12/2022 Aquatic therapy at [facility] [address] -  therapeutic pool temp approximately 92 degrees. Pt enters building via W/C and son accompanying. Treatment took place in water 3.8 to  4 ft 8 in.feet deep depending upon activity.  Pt entered and exited the pool via stair and handrails with step to pattern.  Aquatic Therapy:  Water walking for warm up forwards, and backwards and side stepping holding blue DB Therapeutic Exercise: Step ups on submerged step x10 fwd/lat BIL Walking march Lateral walking with DB push downs Fwd walking with DB push downs At edge of pool, pt performed LE exercise: Heel raises - 2x20 Hamstring curl x20 BIL  Hip circles CW/CCW x10 BIL   Hip abd/add x20 BIL   On submerged bench Bicycle kicks x 1' Reverse bicycle kicks x 1' Flutter kicks x 1' Scissor kicks x 1' Pt requires the buoyancy of water for active assisted exercises with buoyancy supported for strengthening and AROM exercises. Hydrostatic pressure also supports joints by unweighting joint load by at least 50 % in 3-4 feet depth water. 80% in chest to neck deep water. Water will provide assistance with movement using the current and laminar flow while the buoyancy reduces weight bearing. Pt requires the viscosity of the water for resistance with strengthening exercises.     ASSESSMENT:   CLINICAL IMPRESSION: Sore following last session, a little carrying over today.  Repeated previous exercise program w/o change in reps or resistance to allow knees to accommodate to PREs.     Patient returns to OPPT for continue treatment to B knees due to underlying pain from OA.  She has been  offered B TKA once BMI is in acceptable range.  Patient presents with ROM and strength deficits, limited ambulation tolerance due to pain, weakness and loss of endurance.  Patient uses power chair as main means of transportation/mobility.  Sh is able to ambulate household distances with single cane and furniture surfing.  Patient is a good candidate for continued OPPT consisting  of land based PT initially to establish a concise HEP following by a short episode of aquatic PT to establish an independent program using one of the local pool facilities.    OBJECTIVE IMPAIRMENTS: Abnormal gait, decreased activity tolerance, decreased balance, decreased endurance, decreased knowledge of condition, decreased knowledge of use of DME, decreased mobility, difficulty walking, decreased ROM, decreased strength, obesity, and pain.    ACTIVITY LIMITATIONS: carrying, lifting, standing, squatting, stairs, and transfers   PERSONAL FACTORS: Age, Fitness, Past/current experiences, and Time since onset of injury/illness/exacerbation are also affecting patient's functional outcome.    REHAB POTENTIAL: Fair based on chronicity, unsuccessful conservative interventions and advanced OA of B knees   CLINICAL DECISION MAKING: Stable/uncomplicated   EVALUATION COMPLEXITY: Low     GOALS: Goals reviewed with patient? No   SHORT TERM GOALS: Target date: 02/03/2022   Patient to demonstrate independence in HEP  Baseline:[email or web address] Goal status: Ongoing Pt reports non-compliance 02/12/22   2.  Initiate aquatic program Baseline: TBD Goal status: Met Initiated      LONG TERM GOALS: Target date: 06/22/2022     Increase BLE strength to 4-/5 in deficit areas Baseline:  MMT Right eval Left eval R 04/22/22 L 04/22/22  Hip flexion 3 3 3+ 3+  Hip extension        Hip abduction 3 3 3+ 3+  Hip adduction        Hip internal rotation        Hip external rotation        Knee flexion 3+ 3+ 3+ 3+  Knee extension 3+ 3+ 3+ 3+  Ankle dorsiflexion        Ankle plantarflexion 3+ 3+ UTA UTA    Goal status: INITIAL   2.  Increase FOTO score to 56 Baseline: 21 Goal status: INITIAL   3.  Increase B knee extension AROM to -10d B and flexion to 115d B Baseline:    R 01/20/22  L 01/20/22 R 04/22/22 L 04/22/22  Knee flexion 92d 100d 110d 95d  Knee extension -25d -25 -40/-35d -30/-45d   Goal  status: INITIAL  4. Increase ambulation distance to 167ft with single cane  Baseline: 26ft with B canes and SBA  Goal Status: Initial  5. Increase 30 second chair stand reps to 6 with UE support  Baseline: 4 with UE support  Goal Status: Initial       PLAN:   PT FREQUENCY: 1-2x/week   PT DURATION: 4 weeks   PLANNED INTERVENTIONS: Therapeutic exercises, Therapeutic activity, Neuromuscular re-education, Balance training, Gait training, Patient/Family education, Self Care, Joint mobilization, Stair training, DME instructions, and Re-evaluation   PLAN FOR NEXT SESSION: HEP review and update, ROM, strengthening, gait training and functional activities  Learta Codding PT  05/10/22 12:57 PM Phone: [phone] Fax: [phone]

## 2022-05-11 ENCOUNTER — Ambulatory Visit: Payer: 59

## 2022-05-11 DIAGNOSIS — G8929 Other chronic pain: Secondary | ICD-10-CM | POA: Diagnosis not present

## 2022-05-11 DIAGNOSIS — M6281 Muscle weakness (generalized): Secondary | ICD-10-CM

## 2022-05-11 DIAGNOSIS — M25561 Pain in right knee: Secondary | ICD-10-CM | POA: Diagnosis not present

## 2022-05-11 DIAGNOSIS — M25562 Pain in left knee: Secondary | ICD-10-CM | POA: Diagnosis not present

## 2022-05-13 ENCOUNTER — Ambulatory Visit: Payer: 59

## 2022-05-13 DIAGNOSIS — M6281 Muscle weakness (generalized): Secondary | ICD-10-CM

## 2022-05-13 DIAGNOSIS — M25562 Pain in left knee: Secondary | ICD-10-CM | POA: Diagnosis not present

## 2022-05-13 DIAGNOSIS — G8929 Other chronic pain: Secondary | ICD-10-CM | POA: Diagnosis not present

## 2022-05-13 DIAGNOSIS — M25561 Pain in right knee: Secondary | ICD-10-CM | POA: Diagnosis not present

## 2022-05-13 NOTE — Therapy (Signed)
OUTPATIENT PHYSICAL THERAPY TREATMENT NOTE  Patient Name: ARWEN HASELEY MRN: [id number] DOB:11/02/1965, 57 y.o., female Today's Date: 05/13/2022  PCP: Fayette Pho, MD    REFERRING PROVIDER: Otho Darner, MD    PT End of Session - 05/13/22 1543     Visit Number 11    Number of Visits 20    Date for PT Re-Evaluation 06/17/22    Authorization Type [employer]    PT Start Time 1543   arrived late   PT Stop Time 1615    PT Time Calculation (min) 32 min    Activity Tolerance Patient limited by pain;Patient tolerated treatment well    Behavior During Therapy [facility] for tasks assessed/performed               Past Medical History:  Diagnosis Date   ALLERGIC RHINITIS    takes Zyrtec daily   Anxiety    Arthritis    Asthma    Albuterol prn;Symbicort daily and Singulair daily   Bronchitis    hx of   CAP (community acquired pneumonia) 02/20/2015   Carpal tunnel syndrome    right   Chronic back pain    Constipation    Miralax prn   Depression    takes Cymbalta daily   Diabetes    type 2    Eczema    Gallstones 2010   GERD (gastroesophageal reflux disease)    takes Omeprazole daily   Hemorrhoids    Hypertension    takes Prinizide daily and Clonidine on Mondays   Hypoventilation associated with obesity syndrome    Insomnia    Irritable bladder    Joint pain    Morbid (severe) obesity due to excess calories 04/10/2007   Restrictive changes on pfts 11/30/2007     Myasthenia gravis 1994   Positive Acetylcholine receptor Ab and single fiber EMG Dr. Clarisa [facility]red [facility] 1996- Dr. 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 Coralyn Helling- 10/30/2007 ANA positive, ANA titer  negative, RF less than 20   Rheumatoid arthritis(714.0)    Sarcoidosis 12/23/2010   Derm  dx NCG  12/03/10 (path report in EPIC)    Overview:  Overview:  Diagnosed on skin biopsy November 2012  Last Assessment & Plan:  Labs are unremarkable. CXR improved/stable. PFT wnl. I am happy patient had these tests and we have a baseline for her. At this time, I do not see an indication for starting chronic steroids daily. Patient agrees. Her skin lesions are bothering her the most and syst   Shortness of breath    can be sitting as well as exertion   Skin irritation    skin itchy   Trigger finger    Urinary frequency    takes Ditropan daily   Viral meningitis    history of viral meningitis   Past Surgical History:  Procedure Laterality Date   BRONCHOSCOPY  08/2007   CARPAL TUNNEL RELEASE  05/20/2011   Procedure: CARPAL TUNNEL RELEASE;  Surgeon: [name], MD;  Location: [facility] OR;  Service: Orthopedics;  Laterality:  Right;   CARPAL TUNNEL RELEASE Right 11/22/2019   Procedure: RIGHT HAND CARPAL TUNNEL RELEASE;  Surgeon: Jones Broom, MD;  Location: [facility] ORS;  Service: Orthopedics;  Laterality: Right;   CARPAL TUNNEL RELEASE Left 02/25/2022   Procedure: CARPAL TUNNEL RELEASE;  Surgeon: Jones Broom, MD;  Location: [facility] ORS;  Service: Orthopedics;  Laterality: Left;   CESAREAN SECTION  09/15/2004   COLONOSCOPY N/A 01/21/2014   Procedure: COLONOSCOPY;  Surgeon: Iva Boop, MD;  Location: [facility];  Service: Endoscopy;  Laterality: N/A;   cortisone injection     receives an injection every 3months   ESOPHAGOGASTRODUODENOSCOPY     ESOPHAGOGASTRODUODENOSCOPY N/A 01/21/2014   Procedure: ESOPHAGOGASTRODUODENOSCOPY (EGD);  Surgeon: Iva Boop, MD;  Location: [facility];  Service: Endoscopy;  Laterality: N/A;   LAPAROSCOPIC CHOLECYSTECTOMY  07/2008   by Dr. Cyndia Bent   Thymus resection   08/25/1993   TRIGGER FINGER RELEASE  05/20/2011   Procedure: RELEASE TRIGGER FINGER/A-1 PULLEY;  Surgeon: [name], MD;  Location: [facility] OR;  Service: Orthopedics;  Laterality: Right;  RIGHT TRIGGER THUMB RELEASE AND RIGHT CARPAL TUNNEL RELEASE   Patient Active Problem List   Diagnosis Date Noted   Weight loss advised 03/07/2022   Drug-induced constipation 12/09/2021   Anxiety 09/23/2021   Chronic right shoulder pain 03/24/2021   Left wrist pain 03/24/2021   Diabetic neuropathy 02/25/2021   Healthcare maintenance 10/21/2020   Hyperlipidemia (primary prevention, LDL goal <70) 09/04/2020   Wheelchair dependence 05/05/2020   Carpal tunnel syndrome of right wrist, RECURRENT 10/15/2019   Insomnia, idiopathic 09/30/2018   Grade I diastolic dysfunction 09/30/2018   Encounter for chronic pain management 02/11/2014   Diabetes 02/11/2014   Cystocele 09/14/2012   Chronic pain of both knees 05/28/2010   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   Allergic rhinitis 05/30/2008   Carpal tunnel syndrome of left wrist 01/17/2008   OBESITY HYPOVENTILATION SYNDROME 12/27/2007   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  She has been told that she has arthritis in both knees.  She was seen at [facility] orthopedics recently and told she needed to work on strengthening her legs and they have actually ordered  therapy.  They also discussed with her that she needed to work on weight loss which she is doing.  She states she has had bilateral knee pain for some 30 years.  No known injury.  Radiographs in 2019 are available and show severe tricompartmental arthritis of the right knee.  She does take diclofenac Tylenol Lyrica and oxycodone for her knee pain but states this really helped.  She is also tried injections both cortisone and gel injections without any real relief.  She notes that she has been basically dependent upon a motorized wheelchair for the last 2 years.  She does ambulate short distances though.  She states prior to the motorized wheelchair she was walking with a walker.  She is lost from 430 pounds down to 316 pounds.  States due to  her myasthenia gravis she has been on prednisone.  Has gained a lot of weight over the years.  She has also had weakness in the extremities for years.  Reports that her diabetes is under good control at this point in time.  PRECAUTIONS/RESTRICTIONS:   FALL  SUBJECTIVE: Pt presents with continued pain in bilateral knees. Is trying to get back to joining aquatic center.    PAIN:  Are you having pain? Yes: NPRS scale: 8/10 (L>R) Pain location: B knees Pain description: ache Aggravating factors: everything Relieving factors: nothing  OBJECTIVE: (objective measures completed at initial evaluation unless otherwise dated)  DIAGNOSTIC FINDINGS: CLINICAL DATA:  57 year old female with a history of chronic right knee pain   EXAM: RIGHT KNEE 3 VIEWS   COMPARISON:  08/03/2013   FINDINGS: No acute displaced fracture. Advanced joint space narrowing of the mediolateral compartment with marginal osteophyte formation, sclerotic changes. Degenerative changes of the patellofemoral joint. Changes have not progressed since the comparison of 08/03/2013   IMPRESSION: Advanced tricompartmental osteoarthritis, worse than the comparison study of 2015.   No acute bony  abnormality identified.     Electronically Signed   By: Gilmer Mor D.O.   On: 11/09/2017 09:34     PATIENT SURVEYS:  FOTO 21(56 predicted)    COGNITION: Overall cognitive status: Within functional limits for tasks assessed                         SENSATION: Not tested   MUSCLE LENGTH: UTA due to pain levels   POSTURE:  UTA due to poor standing tolerance   PALPATION: deferred   LOWER EXTREMITY ROM:   Active ROM Right eval Left eval R 04/22/22 L  04/22/22  Hip flexion        Hip extension        Hip abduction        Hip adduction        Hip internal rotation        Hip external rotation        Knee flexion 92d 100d 110d 95d  Knee extension -25d -25 -40/-35d -30/-45d  Ankle dorsiflexion        Ankle plantarflexion        Ankle inversion        Ankle eversion         (Blank rows = not tested)   LOWER EXTREMITY MMT:   MMT Right eval Left eval R 04/22/22 L 04/22/22  Hip flexion 3 3 3+ 3+  Hip extension        Hip abduction 3 3 3+ 3+  Hip adduction        Hip internal rotation        Hip external rotation        Knee flexion 3+ 3+ 3+ 3+  Knee extension 3+ 3+ 3+ 3+  Ankle dorsiflexion        Ankle plantarflexion 3+ 3+ UTA UTA  Ankle inversion        Ankle eversion         PF strength testing deferred due to B achilles pain.   LOWER EXTREMITY SPECIAL TESTS:  UTA due to pain and limited tolerance to testing positions    FUNCTIONAL TESTS:  30 seconds chair stand test 0 reps 04/22/22 30s chair stand test 4 reps with UE support   GAIT: Distance walked: 10ft Assistive device utilized: Wheelchair (power) Level of assistance: Complete Independence Comments: Patient using power chair  for mobility 04/22/22 66ft with SBA and B canes      PATIENT EDUCATION:  Education details: Discussed eval findings, rehab rationale and POC and patient is in agreement  Person educated: Patient Education method: Explanation Education comprehension: verbalized  understanding and needs further education   HOME EXERCISE PROGRAM: AAccess Code: [email or web address] URL: [email or web address] Date: 04/22/2022 Prepared by: Gustavus Bryant  Exercises - Seated Long Arc Quad  - 3 x daily - 5 x weekly - 2 sets - 10 reps - Seated Hamstring Stretch  - 3 x daily - 5 x weekly - 1 sets - 2 reps - 30s hold - Seated Heel Slide  - 3 x daily - 5 x weekly - 2 sets - 10 reps - 2s hold  [facility] Adult PT Treatment:                                                DATE: 05/12/21 Therapeutic Exercise: LAQ 10/10  Supine SLR 2x10 each SAQ 2x10 2# Hooklying knee fallout 2x10 GTB Hooklying ball squeeze 2x10 - 5" hold  [facility] Adult PT Treatment:                                                DATE: 05/10/21 Therapeutic Exercise: LAQ 10/10  Heel slides 10/10 SAQ 10/10  [facility] Adult PT Treatment:                                                DATE: 04/29/22 Therapeutic Exercise: Seated hamstring stretch on mat 30s x2 B Seated gastroc stretch with strap 30s x2 B FAQs 2# 15x2 B Seated ham curls 15x2 B YTB Supine SLR 15x B Supine SAQs 15x2 B 2# Heel slides 15x B Supine hip fallouts RTB 15x B, 15/15 unilaterally  [facility] Adult PT Treatment:                                                DATE: 04/27/22 Therapeutic Exercise: Seated hamstring stretch over stool 30s x2 B Seated gastroc stretch with towel 30s x2 B FAQs 2# 15x2 B Seated ham curls 15x2 B YTB Supine SLR 15x B Supine SAQs 15x2 B Heel slides with strap 15x B Supine hip fallouts RTB 15x B, 15/15 unilaterally   [facility] Adult PT Treatment:                                                DATE: 04/22/22 Therapeutic Exercise: Seated heel slides 10x B Seated FAQs 10x B Seated hamstring stretch 30s x2 B Gait 101ft with B canes and SBA   [facility] Adult PT Treatment:  DATE: 02/19/2022 Aquatic therapy at [facility] [place]- [address] - therapeutic pool temp 91 degrees in lap pool Pt  enters building via W/C and son accompanying. Treatment took place in water 3.8 to 4 ft 8 in.feet deep depending upon activity.  Pt entered and exited the pool via stair and handrails.  Water walking for warm up forwards, and backwards and side stepping with blue DB  At edge of pool, pt performed LE exercise Heel raises - 2x20 Walking march with blue DB x2 laps Hamstring curl x20 Lt (Rt painful and ROM limited today) Hip circles CW/CCW x10 each BIL   Hip abd/add x20 BIL Runners Stretch x 30" BIL Hamstring stretch x 30" BIL Step ups on submerged step x10 fwd/lat BIL On submerged bench LAQ x1' Bicycle kicks x 1' Flutter kicks x 1' Scissor kicks x 1'  Pt requires the buoyancy of water for active assisted exercises with buoyancy supported for strengthening and AROM exercises. Hydrostatic pressure also supports joints by unweighting joint load by at least 50 % in 3-4 feet depth water. 80% in chest to neck deep water. Water will provide assistance with movement using the current and laminar flow while the buoyancy reduces weight bearing. Pt requires the viscosity of the water for resistance with strengthening exercises.   [facility] Adult PT Treatment:                                                DATE: 02-17-22 Aquatic therapy at [facility] [address] - therapeutic pool temp 91 degrees in lap pool Pt enters building via W/C and son accompanying. Treatment took place in water 3.8 to  4 ft 8 in.feet deep depending upon activity.  Pt entered and exited the pool via stair and handrails   Pain at initiation of RX  6/10  RT knee and LT 4/10    At end  RT 7/10 and LT 4/10  Aquatic Therapy:  Water walking for warm up forwards, and backwards and side stepping Aquastretch for BiIL quadriceps and medial knees BIL At edge of pool, pt performed LE exercise: all exercises below with resistive swimming ankle fins Heel raises - 2x20 Walking march Hamstring curl x20 BIL  Hip circles CW/CCW x10 BIL    Hip abd/add x20 BIL Runners Stretch x 30" x 2 BIL Hamstring stretch x 30" x 2 BIL Step ups on submerged step x10 fwd/lat BIL Step downs with heel tap RT on step x 10, LT on step 2 x 5 Monster walks laterally across pool x 3 15 ft each     Pt requires the buoyancy of water for active assisted exercises with buoyancy supported for strengthening and AROM exercises. Hydrostatic pressure also supports joints by unweighting joint load by at least 50 % in 3-4 feet depth water. 80% in chest to neck deep water. Water will provide assistance with movement using the current and laminar flow while the buoyancy reduces weight bearing. Pt requires the viscosity of the water for resistance with strengthening exercises.  [facility] Adult PT Treatment:                                                DATE: 02/12/2022 Aquatic therapy at [facility] [place]- [address] - therapeutic pool temp approximately 92 degrees. Pt enters building via W/C and son accompanying. Treatment took place in water 3.8 to  4 ft 8 in.feet deep depending upon activity.  Pt entered and exited the pool via stair and handrails with step to pattern.  Aquatic Therapy:  Water walking for warm up forwards, and backwards and side stepping holding blue DB Therapeutic Exercise: Step ups on submerged step x10 fwd/lat BIL Walking march Lateral walking with DB push downs Fwd walking with DB push downs At edge of pool, pt performed LE exercise: Heel raises - 2x20 Hamstring curl x20 BIL  Hip circles CW/CCW x10 BIL   Hip abd/add x20 BIL   On submerged bench Bicycle kicks x 1' Reverse bicycle kicks x 1' Flutter kicks x 1' Scissor kicks x 1' Pt requires the buoyancy of water for active assisted exercises with buoyancy supported for strengthening and AROM exercises. Hydrostatic pressure also supports joints by unweighting joint load by at least 50 % in 3-4 feet depth water. 80% in chest to neck deep water. Water will provide assistance with  movement using the current and laminar flow while the buoyancy reduces weight bearing. Pt requires the viscosity of the water for resistance with strengthening exercises.     ASSESSMENT:   CLINICAL IMPRESSION: Pt was able to complete all prescribed exercises with no adverse effect. Therapy continued to focus on quad and hip strengthening. PT will continue with aquatic sessions, if unable to progress therapeutic benefit is questionable.    Patient returns to OPPT for continue treatment to B knees due to underlying pain from OA.  She has been offered B TKA once BMI is in acceptable range.  Patient presents with ROM and strength deficits, limited ambulation tolerance due to pain, weakness and loss of endurance.  Patient uses power chair as main means of transportation/mobility.  Sh is able to ambulate household distances with single cane and furniture surfing.  Patient is a good candidate for continued OPPT consisting of land based PT initially to establish a concise HEP following by a short episode of aquatic PT to establish an independent program using one of the local pool facilities.    OBJECTIVE IMPAIRMENTS: Abnormal gait, decreased activity tolerance, decreased balance, decreased endurance, decreased knowledge of condition, decreased knowledge of use of DME, decreased mobility, difficulty walking, decreased ROM, decreased strength, obesity, and pain.    ACTIVITY LIMITATIONS: carrying, lifting, standing, squatting, stairs, and transfers   PERSONAL FACTORS: Age, Fitness, Past/current experiences, and Time since onset of injury/illness/exacerbation are also affecting patient's functional outcome.    REHAB POTENTIAL: Fair based on chronicity, unsuccessful conservative interventions and advanced OA of B knees   CLINICAL DECISION MAKING: Stable/uncomplicated   EVALUATION COMPLEXITY: Low     GOALS: Goals reviewed with patient? No   SHORT TERM GOALS: Target date: 02/03/2022   Patient to  demonstrate independence in HEP  Baseline:[email or web address] Goal status: Ongoing Pt reports non-compliance 02/12/22   2.  Initiate aquatic program Baseline: TBD Goal status: Met Initiated      LONG TERM GOALS: Target date: 06/22/2022     Increase BLE strength to 4-/5 in deficit areas Baseline:  MMT Right eval Left eval R 04/22/22 L 04/22/22  Hip flexion 3 3 3+ 3+  Hip extension        Hip abduction 3 3 3+ 3+  Hip adduction        Hip internal rotation        Hip external rotation  Knee flexion 3+ 3+ 3+ 3+  Knee extension 3+ 3+ 3+ 3+  Ankle dorsiflexion        Ankle plantarflexion 3+ 3+ UTA UTA    Goal status: INITIAL   2.  Increase FOTO score to 56 Baseline: 21 Goal status: INITIAL   3.  Increase B knee extension AROM to -10d B and flexion to 115d B Baseline:    R 01/20/22  L 01/20/22 R 04/22/22 L 04/22/22  Knee flexion 92d 100d 110d 95d  Knee extension -25d -25 -40/-35d -30/-45d   Goal status: INITIAL  4. Increase ambulation distance to 125ft with single cane  Baseline: 27ft with B canes and SBA  Goal Status: Initial  5. Increase 30 second chair stand reps to 6 with UE support  Baseline: 4 with UE support  Goal Status: Initial       PLAN:   PT FREQUENCY: 1-2x/week   PT DURATION: 4 weeks   PLANNED INTERVENTIONS: Therapeutic exercises, Therapeutic activity, Neuromuscular re-education, Balance training, Gait training, Patient/Family education, Self Care, Joint mobilization, Stair training, DME instructions, and Re-evaluation   PLAN FOR NEXT SESSION: HEP review and update, ROM, strengthening, gait training and functional activities  Eloy End PT  05/13/22 4:26 PM

## 2022-05-17 ENCOUNTER — Other Ambulatory Visit (HOSPITAL_COMMUNITY): Payer: Self-pay

## 2022-05-18 ENCOUNTER — Ambulatory Visit: Payer: 59

## 2022-05-18 DIAGNOSIS — M25561 Pain in right knee: Secondary | ICD-10-CM | POA: Diagnosis not present

## 2022-05-18 DIAGNOSIS — G8929 Other chronic pain: Secondary | ICD-10-CM | POA: Diagnosis not present

## 2022-05-18 DIAGNOSIS — M6281 Muscle weakness (generalized): Secondary | ICD-10-CM | POA: Diagnosis not present

## 2022-05-18 DIAGNOSIS — M25562 Pain in left knee: Secondary | ICD-10-CM | POA: Diagnosis not present

## 2022-05-18 NOTE — Therapy (Signed)
OUTPATIENT PHYSICAL THERAPY TREATMENT NOTE  Patient Name: RONESHA HEENAN MRN: [id number] DOB:04/13/1965, 57 y.o., female Today's Date: 05/18/2022  PCP: Fayette Pho, MD    REFERRING PROVIDER: Otho Darner, MD    PT End of Session - 05/18/22 1657     Visit Number 12    Number of Visits 20    Date for PT Re-Evaluation 06/17/22    Authorization Type [employer]    PT Start Time 1657    PT Stop Time 1735    PT Time Calculation (min) 38 min    Activity Tolerance Patient limited by pain    Behavior During Therapy [facility] for tasks assessed/performed              Past Medical History:  Diagnosis Date   ALLERGIC RHINITIS    takes Zyrtec daily   Anxiety    Arthritis    Asthma    Albuterol prn;Symbicort daily and Singulair daily   Bronchitis    hx of   CAP (community acquired pneumonia) 02/20/2015   Carpal tunnel syndrome    right   Chronic back pain    Constipation    Miralax prn   Depression    takes Cymbalta daily   Diabetes    type 2    Eczema    Gallstones 2010   GERD (gastroesophageal reflux disease)    takes Omeprazole daily   Hemorrhoids    Hypertension    takes Prinizide daily and Clonidine on Mondays   Hypoventilation associated with obesity syndrome    Insomnia    Irritable bladder    Joint pain    Morbid (severe) obesity due to excess calories 04/10/2007   Restrictive changes on pfts 11/30/2007     Myasthenia gravis 1994   Positive Acetylcholine receptor Ab and single fiber EMG Dr. Clarisa [facility]red [facility]- Dr. 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 Coralyn Helling- 10/30/2007  ANA positive, ANA titer  negative, RF less than 20   Rheumatoid arthritis(714.0)    Sarcoidosis 12/23/2010   Derm  dx NCG  12/03/10 (path report in EPIC)    Overview:  Overview:  Diagnosed on skin biopsy November 2012  Last Assessment & Plan:  Labs are unremarkable. CXR improved/stable. PFT wnl. I am happy patient had these tests and we have a baseline for her. At this time, I do not see an indication for starting chronic steroids daily. Patient agrees. Her skin lesions are bothering her the most and syst   Shortness of breath    can be sitting as well as exertion   Skin irritation    skin itchy   Trigger finger    Urinary frequency    takes Ditropan daily   Viral meningitis    history of viral meningitis   Past Surgical History:  Procedure Laterality Date   BRONCHOSCOPY  08/2007   CARPAL TUNNEL RELEASE  05/20/2011   Procedure: CARPAL TUNNEL RELEASE;  Surgeon: [name], MD;  Location: [facility] OR;  Service: Orthopedics;  Laterality: Right;   CARPAL TUNNEL RELEASE Right  11/22/2019   Procedure: RIGHT HAND CARPAL TUNNEL RELEASE;  Surgeon: Jones Broom, MD;  Location: [facility] ORS;  Service: Orthopedics;  Laterality: Right;   CARPAL TUNNEL RELEASE Left 02/25/2022   Procedure: CARPAL TUNNEL RELEASE;  Surgeon: Jones Broom, MD;  Location: [facility] ORS;  Service: Orthopedics;  Laterality: Left;   CESAREAN SECTION  09/15/2004   COLONOSCOPY N/A 01/21/2014   Procedure: COLONOSCOPY;  Surgeon: Iva Boop, MD;  Location: [facility] ENDOSCOPY;  Service: Endoscopy;  Laterality: N/A;   cortisone injection     receives an injection every 3months   ESOPHAGOGASTRODUODENOSCOPY     ESOPHAGOGASTRODUODENOSCOPY N/A 01/21/2014   Procedure: ESOPHAGOGASTRODUODENOSCOPY (EGD);  Surgeon: Iva Boop, MD;  Location: [facility];  Service: Endoscopy;  Laterality: N/A;   LAPAROSCOPIC CHOLECYSTECTOMY  07/2008   by Dr. Cyndia Bent   Thymus resection  08/25/1993   TRIGGER FINGER RELEASE  05/20/2011   Procedure:  RELEASE TRIGGER FINGER/A-1 PULLEY;  Surgeon: [name], MD;  Location: [facility] OR;  Service: Orthopedics;  Laterality: Right;  RIGHT TRIGGER THUMB RELEASE AND RIGHT CARPAL TUNNEL RELEASE   Patient Active Problem List   Diagnosis Date Noted   Weight loss advised 03/07/2022   Drug-induced constipation 12/09/2021   Anxiety 09/23/2021   Chronic right shoulder pain 03/24/2021   Left wrist pain 03/24/2021   Diabetic neuropathy 02/25/2021   Healthcare maintenance 10/21/2020   Hyperlipidemia (primary prevention, LDL goal <70) 09/04/2020   Wheelchair dependence 05/05/2020   Carpal tunnel syndrome of right wrist, RECURRENT 10/15/2019   Insomnia, idiopathic 09/30/2018   Grade I diastolic dysfunction 09/30/2018   Encounter for chronic pain management 02/11/2014   Diabetes 02/11/2014   Cystocele 09/14/2012   Chronic pain of both knees 05/28/2010   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   Allergic rhinitis 05/30/2008   Carpal tunnel syndrome of left wrist 01/17/2008   OBESITY HYPOVENTILATION SYNDROME 12/27/2007   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  She has been told that she has arthritis in both knees.  She was seen at [facility] recently and told she needed to work on strengthening her legs and they have actually ordered therapy.  They also discussed with  her that she needed to work on weight loss which she is doing.  She states she has had bilateral knee pain for some 30 years.  No known injury.  Radiographs in 2019 are available and show severe tricompartmental arthritis of the right knee.  She does take diclofenac Tylenol Lyrica and oxycodone for her knee pain but states this really helped.  She is also tried injections both cortisone and gel injections without any real relief.  She notes that she has been basically dependent upon a motorized wheelchair for the last 2 years.  She does ambulate short distances though.  She states prior to the motorized wheelchair she was walking with a walker.  She is lost from 430 pounds down to 316 pounds.  States due to her  myasthenia gravis she has been on prednisone.  Has gained a lot of weight over the years.  She has also had weakness in the extremities for years.  Reports that her diabetes is under good control at this point in time.  PRECAUTIONS/RESTRICTIONS:   FALL  SUBJECTIVE: Patient reports that her knees are hurting very badly today, 10/10 and the right one hurts worse.   PAIN:  Are you having pain? Yes: NPRS scale: 10/10 (R>L) Pain location: B knees Pain description: ache Aggravating factors: everything Relieving factors: nothing  OBJECTIVE: (objective measures completed at initial evaluation unless otherwise dated)  DIAGNOSTIC FINDINGS: CLINICAL DATA:  57 year old female with a history of chronic right knee pain   EXAM: RIGHT KNEE 3 VIEWS   COMPARISON:  08/03/2013   FINDINGS: No acute displaced fracture. Advanced joint space narrowing of the mediolateral compartment with marginal osteophyte formation, sclerotic changes. Degenerative changes of the patellofemoral joint. Changes have not progressed since the comparison of 08/03/2013   IMPRESSION: Advanced tricompartmental osteoarthritis, worse than the comparison study of 2015.   No acute bony abnormality identified.      Electronically Signed   By: Gilmer Mor D.O.   On: 11/09/2017 09:34     PATIENT SURVEYS:  FOTO 21(56 predicted)    COGNITION: Overall cognitive status: Within functional limits for tasks assessed                         SENSATION: Not tested   MUSCLE LENGTH: UTA due to pain levels   POSTURE:  UTA due to poor standing tolerance   PALPATION: deferred   LOWER EXTREMITY ROM:   Active ROM Right eval Left eval R 04/22/22 L  04/22/22  Hip flexion        Hip extension        Hip abduction        Hip adduction        Hip internal rotation        Hip external rotation        Knee flexion 92d 100d 110d 95d  Knee extension -25d -25 -40/-35d -30/-45d  Ankle dorsiflexion        Ankle plantarflexion        Ankle inversion        Ankle eversion         (Blank rows = not tested)   LOWER EXTREMITY MMT:   MMT Right eval Left eval R 04/22/22 L 04/22/22  Hip flexion 3 3 3+ 3+  Hip extension        Hip abduction 3 3 3+ 3+  Hip adduction        Hip internal rotation        Hip external rotation        Knee flexion 3+ 3+ 3+ 3+  Knee extension 3+ 3+ 3+ 3+  Ankle dorsiflexion        Ankle plantarflexion 3+ 3+ UTA UTA  Ankle inversion        Ankle eversion         PF strength testing deferred due to B achilles pain.   LOWER EXTREMITY SPECIAL TESTS:  UTA due to pain and limited tolerance to testing positions    FUNCTIONAL TESTS:  30 seconds chair stand test 0 reps 04/22/22 30s chair stand test 4 reps with UE support   GAIT: Distance walked: 53ft Assistive device utilized: Wheelchair (power) Level of assistance: Complete Independence Comments: Patient using power chair for mobility  04/22/22 1ft with SBA and B canes      PATIENT EDUCATION:  Education details: Discussed eval findings, rehab rationale and POC and patient is in agreement  Person educated: Patient Education method: Explanation Education comprehension: verbalized understanding and needs further  education   HOME EXERCISE PROGRAM: AAccess Code: [email or web address] URL: [email or web address] Date: 04/22/2022 Prepared by: Gustavus Bryant  Exercises - Seated Long Arc Quad  - 3 x daily - 5 x weekly - 2 sets - 10 reps - Seated Hamstring Stretch  - 3 x daily - 5 x weekly - 1 sets - 2 reps - 30s hold - Seated Heel Slide  - 3 x daily - 5 x weekly - 2 sets - 10 reps - 2s hold  [facility] Adult PT Treatment:                                                DATE: 05/18/22 Therapeutic Exercise: LAQ 10/10  Seated ham curls 10x2 B YTB  Seated hamstring stretch 30s x2 B  Supine SLR 2x10 each - small ROM SAQ 2x10 - limited ROM Hooklying knee fallout 2x10 GTB Hooklying ball squeeze 2x10 - 5" hold   [facility] Adult PT Treatment:                                                DATE: 05/12/21 Therapeutic Exercise: LAQ 10/10  Supine SLR 2x10 each SAQ 2x10 2# Hooklying knee fallout 2x10 GTB Hooklying ball squeeze 2x10 - 5" hold  [facility] Adult PT Treatment:                                                DATE: 05/10/21 Therapeutic Exercise: LAQ 10/10  Heel slides 10/10 SAQ 10/10   ASSESSMENT:   CLINICAL IMPRESSION:  Patient presents to PT reporting increased pain in BIL knees today, at a 10/10. She is excited about beginning aquatic therapy again this week. Session today continued to focus on gentle mat based LE strengthening. She remains limited by pain throughout session, needing rest breaks and working in a limited ROM. Patient continues to benefit from skilled PT services and should be progressed as able to improve functional independence.    Patient returns to OPPT for continue treatment to B knees due to underlying pain from OA.  She has been offered B TKA once BMI is in acceptable range.  Patient presents with ROM and strength deficits, limited ambulation tolerance due to pain, weakness and loss of endurance.  Patient uses power chair as main means of transportation/mobility.  Sh is able to  ambulate household distances with single cane and furniture surfing.  Patient is a good candidate for continued OPPT consisting of land based PT initially to establish a concise HEP following by a short episode of aquatic PT to establish an independent program using one of the local pool facilities.    OBJECTIVE IMPAIRMENTS: Abnormal gait, decreased activity tolerance, decreased balance, decreased endurance, decreased knowledge of condition, decreased knowledge of use of DME, decreased mobility, difficulty walking, decreased ROM, decreased strength, obesity, and pain.    ACTIVITY LIMITATIONS:  carrying, lifting, standing, squatting, stairs, and transfers   PERSONAL FACTORS: Age, Fitness, Past/current experiences, and Time since onset of injury/illness/exacerbation are also affecting patient's functional outcome.    REHAB POTENTIAL: Fair based on chronicity, unsuccessful conservative interventions and advanced OA of B knees   CLINICAL DECISION MAKING: Stable/uncomplicated   EVALUATION COMPLEXITY: Low     GOALS: Goals reviewed with patient? No   SHORT TERM GOALS: Target date: 02/03/2022   Patient to demonstrate independence in HEP  Baseline:[email or web address] Goal status: Ongoing Pt reports non-compliance 02/12/22   2.  Initiate aquatic program Baseline: TBD Goal status: Met Initiated      LONG TERM GOALS: Target date: 06/22/2022     Increase BLE strength to 4-/5 in deficit areas Baseline:  MMT Right eval Left eval R 04/22/22 L 04/22/22  Hip flexion 3 3 3+ 3+  Hip extension        Hip abduction 3 3 3+ 3+  Hip adduction        Hip internal rotation        Hip external rotation        Knee flexion 3+ 3+ 3+ 3+  Knee extension 3+ 3+ 3+ 3+  Ankle dorsiflexion        Ankle plantarflexion 3+ 3+ UTA UTA    Goal status: INITIAL   2.  Increase FOTO score to 56 Baseline: 21 Goal status: INITIAL   3.  Increase B knee extension AROM to -10d B and flexion to 115d B Baseline:     R 01/20/22  L 01/20/22 R 04/22/22 L 04/22/22  Knee flexion 92d 100d 110d 95d  Knee extension -25d -25 -40/-35d -30/-45d   Goal status: INITIAL  4. Increase ambulation distance to 15ft with single cane  Baseline: 59ft with B canes and SBA  Goal Status: Initial  5. Increase 30 second chair stand reps to 6 with UE support  Baseline: 4 with UE support  Goal Status: Initial       PLAN:   PT FREQUENCY: 1-2x/week   PT DURATION: 4 weeks   PLANNED INTERVENTIONS: Therapeutic exercises, Therapeutic activity, Neuromuscular re-education, Balance training, Gait training, Patient/Family education, Self Care, Joint mobilization, Stair training, DME instructions, and Re-evaluation   PLAN FOR NEXT SESSION: HEP review and update, ROM, strengthening, gait training and functional activities  Berta Minor PTA  05/18/22 5:33 PM

## 2022-05-20 ENCOUNTER — Ambulatory Visit: Payer: 59

## 2022-05-21 ENCOUNTER — Ambulatory Visit: Payer: 59

## 2022-05-21 DIAGNOSIS — G8929 Other chronic pain: Secondary | ICD-10-CM

## 2022-05-21 DIAGNOSIS — M25561 Pain in right knee: Secondary | ICD-10-CM | POA: Diagnosis not present

## 2022-05-21 DIAGNOSIS — M25562 Pain in left knee: Secondary | ICD-10-CM | POA: Diagnosis not present

## 2022-05-21 DIAGNOSIS — M6281 Muscle weakness (generalized): Secondary | ICD-10-CM

## 2022-05-21 NOTE — Therapy (Signed)
OUTPATIENT PHYSICAL THERAPY TREATMENT NOTE  Patient Name: [name] MRN: [id number] DOB:[birth year], 57 y.o., female Today's Date: 05/21/2022  PCP: Fayette Pho, MD    REFERRING PROVIDER: Otho Darner, MD    PT End of Session - 05/21/22 1532     Visit Number 13    Number of Visits 20    Date for PT Re-Evaluation 06/17/22    Authorization Type [employer]    PT Start Time 1530    PT Stop Time 1600    PT Time Calculation (min) 30 min    Activity Tolerance Patient limited by pain    Behavior During Therapy [facility] for tasks assessed/performed              Past Medical History:  Diagnosis Date   ALLERGIC RHINITIS    takes Zyrtec daily   Anxiety    Arthritis    Asthma    Albuterol prn;Symbicort daily and Singulair daily   Bronchitis    hx of   CAP (community acquired pneumonia) 02/20/2015   Carpal tunnel syndrome    right   Chronic back pain    Constipation    Miralax prn   Depression    takes Cymbalta daily   Diabetes (HCC)    type 2    Eczema    Gallstones 2010   GERD (gastroesophageal reflux disease)    takes Omeprazole daily   Hemorrhoids    Hypertension    takes Prinizide daily and Clonidine on Mondays   Hypoventilation associated with obesity syndrome (HCC)    Insomnia    Irritable bladder    Joint pain    Morbid (severe) obesity due to excess calories (HCC) 04/10/2007   Restrictive changes on pfts 11/30/2007     Myasthenia gravis 1994   Positive Acetylcholine receptor Ab and single fiber EMG Dr. Clarisa [facility]- Dr. 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  Coralyn Helling- 10/30/2007 ANA positive, ANA titer  negative, RF less than 20   Rheumatoid arthritis(714.0)    Sarcoidosis 12/23/2010   Derm  dx NCG  12/03/10 (path report in EPIC)    Overview:  Overview:  Diagnosed on skin biopsy November 2012  Last Assessment & Plan:  Labs are unremarkable. CXR improved/stable. PFT wnl. I am happy patient had these tests and we have a baseline for her. At this time, I do not see an indication for starting chronic steroids daily. Patient agrees. Her skin lesions are bothering her the most and syst   Shortness of breath    can be sitting as well as exertion   Skin irritation    skin itchy   Trigger finger    Urinary frequency    takes Ditropan daily   Viral meningitis    history of viral meningitis   Past Surgical History:  Procedure Laterality Date   BRONCHOSCOPY  08/2007   CARPAL TUNNEL RELEASE  05/20/2011   Procedure: CARPAL TUNNEL RELEASE;  Surgeon: [name], MD;  Location: [facility] OR;  Service: Orthopedics;  Laterality: Right;   CARPAL  TUNNEL RELEASE Right 11/22/2019   Procedure: RIGHT HAND CARPAL TUNNEL RELEASE;  Surgeon: Jones Broom, MD;  Location: [facility] ORS;  Service: Orthopedics;  Laterality: Right;   CARPAL TUNNEL RELEASE Left 02/25/2022   Procedure: CARPAL TUNNEL RELEASE;  Surgeon: Jones Broom, MD;  Location: [facility] ORS;  Service: Orthopedics;  Laterality: Left;   CESAREAN SECTION  09/15/2004   COLONOSCOPY N/A 01/21/2014   Procedure: COLONOSCOPY;  Surgeon: Iva Boop, MD;  Location: [facility] END[facility]COPY;  Service: Endoscopy;  Laterality: N/A;   cortisone injection     receives an injection every 3months   ESOPHAGOGASTRODUODENOSCOPY     ESOPHAGOGASTRODUODENOSCOPY N/A 01/21/2014   Procedure: ESOPHAGOGASTRODUODENOSCOPY (EGD);  Surgeon: Iva Boop, MD;  Location: [facility];  Service: Endoscopy;  Laterality: N/A;   LAPAROSCOPIC CHOLECYSTECTOMY  07/2008   by Dr. Cyndia Bent   Thymus resection  08/25/1993   TRIGGER FINGER RELEASE   05/20/2011   Procedure: RELEASE TRIGGER FINGER/A-1 PULLEY;  Surgeon: [name], MD;  Location[facility] OR;  Service: Orthopedics;  Laterality: Right;  RIGHT TRIGGER THUMB RELEASE AND RIGHT CARPAL TUNNEL RELEASE   Patient Active Problem List   Diagnosis Date Noted   Weight loss advised 03/07/2022   Drug-induced constipation 12/09/2021   Anxiety 09/23/2021   Chronic right shoulder pain 03/24/2021   Left wrist pain 03/24/2021   Diabetic neuropathy (HCC) 02/25/2021   Healthcare maintenance 10/21/2020   Hyperlipidemia (primary prevention, LDL goal <70) 09/04/2020   Wheelchair dependence 05/05/2020   Carpal tunnel syndrome of right wrist, RECURRENT 10/15/2019   Insomnia, idiopathic 09/30/2018   Grade I diastolic dysfunction 09/30/2018   Encounter for chronic pain management 02/11/2014   Diabetes (HCC) 02/11/2014   Cystocele 09/14/2012   Chronic pain of both knees 05/28/2010   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   Allergic rhinitis 05/30/2008   Carpal tunnel syndrome of left wrist 01/17/2008   OBESITY HYPOVENTILATION SYNDROME 12/27/2007   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   Obstructive sleep apnea 10/30/2007   BMI 50.0-59.9, adult (HCC) 04/10/2007   MYASTHENIA 03/07/2007   Depression, recurrent (HCC) 03/24/2006   Essential (primary) hypertension 03/24/2006   Gastro-esophageal reflux disease without esophagitis 03/24/2006    THERAPY DIAG:  Chronic pain of left knee  Chronic pain of right knee  Muscle weakness (generalized)   Rationale for Evaluation and Treatment Rehabilitation  REFERRING DIAG: BILATERAL KNEE OSTEOARTHRITIS :HIP MUSCLE ; KNEE FLEXION   PERTINENT HISTORY:  HPI Patient is 57 year old female comes in today with bilateral knee pain second opinion.  She has been told that she has arthritis in both knees.  She was seen at [facility] orthopedics recently and told she needed to work on strengthening her legs and they have  actually ordered therapy.  They also discussed with her that she needed to work on weight loss which she is doing.  She states she has had bilateral knee pain for some 30 years.  No known injury.  Radiographs in 2019 are available and show severe tricompartmental arthritis of the right knee.  She does take diclofenac Tylenol Lyrica and oxycodone for her knee pain but states this really helped.  She is also tried injections both cortisone and gel injections without any real relief.  She notes that she has been basically dependent upon a motorized wheelchair for the last 2 years.  She does ambulate short distances though.  She states prior to the motorized wheelchair she was walking with a walker.  She is lost from 430 pounds down to  316 pounds.  States due to her myasthenia gravis she has been on prednisone.  Has gained a lot of weight over the years.  She has also had weakness in the extremities for years.  Reports that her diabetes is under good control at this point in time.  PRECAUTIONS/RESTRICTIONS:   FALL  SUBJECTIVE: Patient reports continued high levels of pain in BIL knees, also states she was up all night wit pain her Lt hand/wrist.   PAIN:  Are you having pain? Yes: NPRS scale: 8/10 (R>L) Pain location: B knees Pain description: ache Aggravating factors: everything Relieving factors: nothing  OBJECTIVE: (objective measures completed at initial evaluation unless otherwise dated)  DIAGNOSTIC FINDINGS: CLINICAL DATA:  57 year old female with a history of chronic right knee pain   EXAM: RIGHT KNEE 3 VIEWS   COMPARISON:  08/03/2013   FINDINGS: No acute displaced fracture. Advanced joint space narrowing of the mediolateral compartment with marginal osteophyte formation, sclerotic changes. Degenerative changes of the patellofemoral joint. Changes have not progressed since the comparison of 08/03/2013   IMPRESSION: Advanced tricompartmental osteoarthritis, worse than the  comparison study of 2015.   No acute bony abnormality identified.     Electronically Signed   By: Gilmer Mor D.O.   On: 11/09/2017 09:34     PATIENT SURVEYS:  FOTO 21(56 predicted)    COGNITION: Overall cognitive status: Within functional limits for tasks assessed                         SENSATION: Not tested   MUSCLE LENGTH: UTA due to pain levels   POSTURE:  UTA due to poor standing tolerance   PALPATION: deferred   LOWER EXTREMITY ROM:   Active ROM Right eval Left eval R 04/22/22 L  04/22/22  Hip flexion        Hip extension        Hip abduction        Hip adduction        Hip internal rotation        Hip external rotation        Knee flexion 92d 100d 110d 95d  Knee extension -25d -25 -40/-35d -30/-45d  Ankle dorsiflexion        Ankle plantarflexion        Ankle inversion        Ankle eversion         (Blank rows = not tested)   LOWER EXTREMITY MMT:   MMT Right eval Left eval R 04/22/22 L 04/22/22  Hip flexion 3 3 3+ 3+  Hip extension        Hip abduction 3 3 3+ 3+  Hip adduction        Hip internal rotation        Hip external rotation        Knee flexion 3+ 3+ 3+ 3+  Knee extension 3+ 3+ 3+ 3+  Ankle dorsiflexion        Ankle plantarflexion 3+ 3+ UTA UTA  Ankle inversion        Ankle eversion         PF strength testing deferred due to B achilles pain.   LOWER EXTREMITY SPECIAL TESTS:  UTA due to pain and limited tolerance to testing positions    FUNCTIONAL TESTS:  30 seconds chair stand test 0 reps 04/22/22 30s chair stand test 4 reps with UE support   GAIT: Distance walked: 85ft Assistive device utilized: Wheelchair (power)  Level of assistance: Complete Independence Comments: Patient using power chair for mobility 04/22/22 20ft with SBA and B canes      PATIENT EDUCATION:  Education details: Discussed eval findings, rehab rationale and POC and patient is in agreement  Person educated: Patient Education method:  Explanation Education comprehension: verbalized understanding and needs further education   HOME EXERCISE PROGRAM: AAccess Code: [email or web address] URL: [email or web address] Date: 04/22/2022 Prepared by: Gustavus Bryant  Exercises - Seated Long Arc Quad  - 3 x daily - 5 x weekly - 2 sets - 10 reps - Seated Hamstring Stretch  - 3 x daily - 5 x weekly - 1 sets - 2 reps - 30s hold - Seated Heel Slide  - 3 x daily - 5 x weekly - 2 sets - 10 reps - 2s hold  [facility] Adult PT Treatment:                                                DATE: 05/21/22 Aquatic therapy at [facility]- [address] - therapeutic pool temp 91 degrees in lap pool Pt enters building via W/C and son accompanying. Treatment took place in water 3.8 to 4 ft 8 in.feet deep depending upon activity.  Pt entered and exited the pool via stair and handrails.  Aquatic Exercise: Water walking for warm up forwards, and backwards and side stepping with yellow DB  Walking march with yellow DB x2 laps At edge of pool, pt performed LE exercise Heel raises - 2x20 Hip circles CW/CCW x10 each BIL   Hip abd/add x20 BIL Step ups on submerged step x5 fwd BIL STS x10 Marching in place x1' On submerged bench LAQ x1' Flutter kicks x 1' Scissor kicks x 1'  Pt requires the buoyancy of water for active assisted exercises with buoyancy supported for strengthening and AROM exercises. Hydrostatic pressure also supports joints by unweighting joint load by at least 50 % in 3-4 feet depth water. 80% in chest to neck deep water. Water will provide assistance with movement using the current and laminar flow while the buoyancy reduces weight bearing. Pt requires the viscosity of the water for resistance with strengthening exercises.  [facility] Adult PT Treatment:                                                DATE: 05/18/22 Therapeutic Exercise: LAQ 10/10  Seated ham curls 10x2 B YTB  Seated hamstring stretch 30s x2 B  Supine SLR 2x10 each - small  ROM SAQ 2x10 - limited ROM Hooklying knee fallout 2x10 GTB Hooklying ball squeeze 2x10 - 5" hold   [facility] Adult PT Treatment:                                                DATE: 05/12/21 Therapeutic Exercise: LAQ 10/10  Supine SLR 2x10 each SAQ 2x10 2# Hooklying knee fallout 2x10 GTB Hooklying ball squeeze 2x10 - 5" hold   ASSESSMENT:   CLINICAL IMPRESSION:  Patient presents to aquatic PT 15 minutes late, truncating session, and reporting 8/10 pain in her knees today. Session today focused  on BIL LE strengthening, general conditioning, and prolonged consistent movement in the aquatic environment for use of buoyancy to offload joints and the viscosity of water as resistance during therapeutic exercise. Patient was able to tolerate all prescribed exercises in the aquatic environment with no adverse effects, though he remains limited by pain. Patient continues to benefit from skilled PT services on land and aquatic based and should be progressed as able to improve functional independence.   EVAL - Patient returns to OPPT for continue treatment to B knees due to underlying pain from OA.  She has been offered B TKA once BMI is in acceptable range.  Patient presents with ROM and strength deficits, limited ambulation tolerance due to pain, weakness and loss of endurance.  Patient uses power chair as main means of transportation/mobility.  Sh is able to ambulate household distances with single cane and furniture surfing.  Patient is a good candidate for continued OPPT consisting of land based PT initially to establish a concise HEP following by a short episode of aquatic PT to establish an independent program using one of the local pool facilities.    OBJECTIVE IMPAIRMENTS: Abnormal gait, decreased activity tolerance, decreased balance, decreased endurance, decreased knowledge of condition, decreased knowledge of use of DME, decreased mobility, difficulty walking, decreased ROM, decreased strength,  obesity, and pain.    ACTIVITY LIMITATIONS: carrying, lifting, standing, squatting, stairs, and transfers   PERSONAL FACTORS: Age, Fitness, Past/current experiences, and Time since onset of injury/illness/exacerbation are also affecting patient's functional outcome.    REHAB POTENTIAL: Fair based on chronicity, unsuccessful conservative interventions and advanced OA of B knees   CLINICAL DECISION MAKING: Stable/uncomplicated   EVALUATION COMPLEXITY: Low     GOALS: Goals reviewed with patient? No   SHORT TERM GOALS: Target date: 02/03/2022   Patient to demonstrate independence in HEP  Baseline:[email or web address] Goal status: Ongoing Pt reports non-compliance 02/12/22   2.  Initiate aquatic program Baseline: TBD Goal status: Met Initiated      LONG TERM GOALS: Target date: 06/22/2022     Increase BLE strength to 4-/5 in deficit areas Baseline:  MMT Right eval Left eval R 04/22/22 L 04/22/22  Hip flexion 3 3 3+ 3+  Hip extension        Hip abduction 3 3 3+ 3+  Hip adduction        Hip internal rotation        Hip external rotation        Knee flexion 3+ 3+ 3+ 3+  Knee extension 3+ 3+ 3+ 3+  Ankle dorsiflexion        Ankle plantarflexion 3+ 3+ UTA UTA    Goal status: INITIAL   2.  Increase FOTO score to 56 Baseline: 21 Goal status: INITIAL   3.  Increase B knee extension AROM to -10d B and flexion to 115d B Baseline:    R 01/20/22  L 01/20/22 R 04/22/22 L 04/22/22  Knee flexion 92d 100d 110d 95d  Knee extension -25d -25 -40/-35d -30/-45d   Goal status: INITIAL  4. Increase ambulation distance to 13ft with single cane  Baseline: 68ft with B canes and SBA  Goal Status: Initial  5. Increase 30 second chair stand reps to 6 with UE support  Baseline: 4 with UE support  Goal Status: Initial       PLAN:   PT FREQUENCY: 1-2x/week   PT DURATION: 4 weeks   PLANNED INTERVENTIONS: Therapeutic exercises, Therapeutic activity, Neuromuscular re-education, Balance  training, Gait training,  Patient/Family education, Self Care, Joint mobilization, Stair training, DME instructions, and Re-evaluation   PLAN FOR NEXT SESSION: HEP review and update, ROM, strengthening, gait training and functional activities  Berta Minor PTA  05/21/22 4:06 PM

## 2022-05-24 ENCOUNTER — Other Ambulatory Visit (HOSPITAL_COMMUNITY): Payer: Self-pay | Admitting: Surgical

## 2022-05-24 MED FILL — Glucose Blood Test Strip: 33 days supply | Qty: 100 | Fill #5 | Status: AC

## 2022-05-25 ENCOUNTER — Ambulatory Visit: Payer: 59

## 2022-05-25 ENCOUNTER — Other Ambulatory Visit: Payer: Self-pay

## 2022-05-25 ENCOUNTER — Other Ambulatory Visit (HOSPITAL_COMMUNITY): Payer: Self-pay

## 2022-05-28 ENCOUNTER — Other Ambulatory Visit (HOSPITAL_COMMUNITY): Payer: Self-pay | Admitting: Surgical

## 2022-05-28 ENCOUNTER — Ambulatory Visit: Payer: 59

## 2022-05-28 ENCOUNTER — Other Ambulatory Visit (HOSPITAL_COMMUNITY): Payer: Self-pay

## 2022-05-28 NOTE — Therapy (Unsigned)
OUTPATIENT PHYSICAL THERAPY TREATMENT NOTE  Patient Name: SIMRUN LAROQUE MRN: [id number] DOB:03-02-1965, 57 y.o., female Today's Date: 06/01/2022  PCP: Fayette Pho, MD    REFERRING PROVIDER: Otho Darner, MD    PT End of Session - 06/01/22 1706     Visit Number 14    Number of Visits 20    Date for PT Re-Evaluation 06/17/22    Authorization Type [employer]    PT Start Time 1704    PT Stop Time 1745    PT Time Calculation (min) 41 min    Activity Tolerance Patient limited by pain    Behavior During Therapy [facility] for tasks assessed/performed              Past Medical History:  Diagnosis Date   ALLERGIC RHINITIS    takes Zyrtec daily   Anxiety    Arthritis    Asthma    Albuterol prn;Symbicort daily and Singulair daily   Bronchitis    hx of   CAP (community acquired pneumonia) 02/20/2015   Carpal tunnel syndrome    right   Chronic back pain    Constipation    Miralax prn   Depression    takes Cymbalta daily   Diabetes (HCC)    type 2    Eczema    Gallstones 2010   GERD (gastroesophageal reflux disease)    takes Omeprazole daily   Hemorrhoids    Hypertension    takes Prinizide daily and Clonidine on Mondays   Hypoventilation associated with obesity syndrome (HCC)    Insomnia    Irritable bladder    Joint pain    Morbid (severe) obesity due to excess calories (HCC) 04/10/2007   Restrictive changes on pfts 11/30/2007     Myasthenia gravis 1994   Positive Acetylcholine receptor Ab and single fiber EMG Dr. Clarisa [facility]- Dr. 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 CXR improved/stable. PFT wnl. I am happy patient had these tests and we have a baseline for her. At this time, I do not see an indication for starting chronic steroids daily. Patient agrees. Her skin lesions are bothering her the most and syst   Shortness of breath    can be sitting as well as exertion   Skin irritation    skin itchy   Trigger finger    Urinary frequency    takes Ditropan daily   Viral meningitis    history of viral meningitis   Past Surgical History:  Procedure Laterality Date   BRONCHOSCOPY  08/2007   CARPAL TUNNEL RELEASE  05/20/2011   Procedure: CARPAL TUNNEL RELEASE;  Surgeon: [name], MD;  Location: [facility] OR;  Service: Orthopedics;  Laterality: Right;   CARPAL  TUNNEL RELEASE Right 11/22/2019   Procedure: RIGHT HAND CARPAL TUNNEL RELEASE;  Surgeon: Jones Broom, MD;  Location: [facility] ORS;  Service: Orthopedics;  Laterality: Right;   CARPAL TUNNEL RELEASE Left 02/25/2022   Procedure: CARPAL TUNNEL RELEASE;  Surgeon: Jones Broom, MD;  Location: [facility] ORS;  Service: Orthopedics;  Laterality: Left;   CESAREAN SECTION  09/15/2004   COLONOSCOPY N/A 01/21/2014   Procedure: COLONOSCOPY;  Surgeon: Iva Boop, MD;  Location: [facility] ENDOSCOPY;  Service: Endoscopy;  Laterality: N/A;   cortisone injection     receives an injection every 3months   ESOPHAGOGASTRODUODENOSCOPY     ESOPHAGOGASTRODUODENOSCOPY N/A 01/21/2014   Procedure: ESOPHAGOGASTRODUODENOSCOPY (EGD);  Surgeon: Iva Boop, MD;  Location: [facility];  Service: Endoscopy;  Laterality: N/A;   LAPAROSCOPIC CHOLECYSTECTOMY  07/2008   by Dr. Cyndia Bent   Thymus resection  08/25/1993   TRIGGER FINGER RELEASE   05/20/2011   Procedure: RELEASE TRIGGER FINGER/A-1 PULLEY;  Surgeon: [name], MD;  Location: [facility] OR;  Service: Orthopedics;  Laterality: Right;  RIGHT TRIGGER THUMB RELEASE AND RIGHT CARPAL TUNNEL RELEASE   Patient Active Problem List   Diagnosis Date Noted   Weight loss advised 03/07/2022   Drug-induced constipation 12/09/2021   Anxiety 09/23/2021   Chronic right shoulder pain 03/24/2021   Left wrist pain 03/24/2021   Diabetic neuropathy (HCC) 02/25/2021   Healthcare maintenance 10/21/2020   Hyperlipidemia (primary prevention, LDL goal <70) 09/04/2020   Wheelchair dependence 05/05/2020   Carpal tunnel syndrome of right wrist, RECURRENT 10/15/2019   Insomnia, idiopathic 09/30/2018   Grade I diastolic dysfunction 09/30/2018   Encounter for chronic pain management 02/11/2014   Diabetes (HCC) 02/11/2014   Cystocele 09/14/2012   Chronic pain of both knees 05/28/2010   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   Allergic rhinitis 05/30/2008   Carpal tunnel syndrome of left wrist 01/17/2008   OBESITY HYPOVENTILATION SYNDROME 12/27/2007   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   Obstructive sleep apnea 10/30/2007   BMI 50.0-59.9, adult (HCC) 04/10/2007   MYASTHENIA 03/07/2007   Depression, recurrent (HCC) 03/24/2006   Essential (primary) hypertension 03/24/2006   Gastro-esophageal reflux disease without esophagitis 03/24/2006    THERAPY DIAG:  Chronic pain of left knee  Chronic pain of right knee  Muscle weakness (generalized)   Rationale for Evaluation and Treatment Rehabilitation  REFERRING DIAG: BILATERAL KNEE OSTEOARTHRITIS :HIP MUSCLE ; KNEE FLEXION   PERTINENT HISTORY:  HPI Patient is 57 year old female comes in today with bilateral knee pain second opinion.  She has been told that she has arthritis in both knees.  She was seen at [facility] orthopedics recently and told she needed to work on strengthening her legs and they have  actually ordered therapy.  They also discussed with her that she needed to work on weight loss which she is doing.  She states she has had bilateral knee pain for some 30 years.  No known injury.  Radiographs in 2019 are available and show severe tricompartmental arthritis of the right knee.  She does take diclofenac Tylenol Lyrica and oxycodone for her knee pain but states this really helped.  She is also tried injections both cortisone and gel injections without any real relief.  She notes that she has been basically dependent upon a motorized wheelchair for the last 2 years.  She does ambulate short distances though.  She states prior to the motorized wheelchair she was walking with a walker.  She is lost from 430 pounds down to  316 pounds.  States due to her myasthenia gravis she has been on prednisone.  Has gained a lot of weight over the years.  She has also had weakness in the extremities for years.  Reports that her diabetes is under good control at this point in time.  PRECAUTIONS/RESTRICTIONS:   FALL  SUBJECTIVE: B knee pain at 10/10 today.   PAIN:  Are you having pain? Yes: NPRS scale: 8/10 (R>L) Pain location: B knees Pain description: ache Aggravating factors: everything Relieving factors: nothing  OBJECTIVE: (objective measures completed at initial evaluation unless otherwise dated)  DIAGNOSTIC FINDINGS: CLINICAL DATA:  57 year old female with a history of chronic right knee pain   EXAM: RIGHT KNEE 3 VIEWS   COMPARISON:  08/03/2013   FINDINGS: No acute displaced fracture. Advanced joint space narrowing of the mediolateral compartment with marginal osteophyte formation, sclerotic changes. Degenerative changes of the patellofemoral joint. Changes have not progressed since the comparison of 08/03/2013   IMPRESSION: Advanced tricompartmental osteoarthritis, worse than the comparison study of 2015.   No acute bony abnormality identified.     Electronically Signed    By: Gilmer Mor D.O.   On: 11/09/2017 09:34     PATIENT SURVEYS:  FOTO 21(56 predicted)    COGNITION: Overall cognitive status: Within functional limits for tasks assessed                         SENSATION: Not tested   MUSCLE LENGTH: UTA due to pain levels   POSTURE:  UTA due to poor standing tolerance   PALPATION: deferred   LOWER EXTREMITY ROM:   Active ROM Right eval Left eval R 04/22/22 L  04/22/22 R 06/01/22 L  06/01/22  Hip flexion          Hip extension          Hip abduction          Hip adduction          Hip internal rotation          Hip external rotation          Knee flexion 92d 100d 110d 95d 110d 98d  Knee extension -25d -25 -40/-35d -30/-45d -30d -25d  Ankle dorsiflexion          Ankle plantarflexion          Ankle inversion          Ankle eversion           (Blank rows = not tested)   LOWER EXTREMITY MMT:   MMT Right eval Left eval R 04/22/22 L 04/22/22  Hip flexion 3 3 3+ 3+  Hip extension        Hip abduction 3 3 3+ 3+  Hip adduction        Hip internal rotation        Hip external rotation        Knee flexion 3+ 3+ 3+ 3+  Knee extension 3+ 3+ 3+ 3+  Ankle dorsiflexion        Ankle plantarflexion 3+ 3+ UTA UTA  Ankle inversion        Ankle eversion         PF strength testing deferred due to B achilles pain.   LOWER EXTREMITY SPECIAL TESTS:  UTA due to pain and limited tolerance to testing positions    FUNCTIONAL TESTS:  30 seconds chair stand test 0 reps 04/22/22 30s chair stand test 4 reps with  UE support   GAIT: Distance walked: 84ft Assistive device utilized: Wheelchair (power) Level of assistance: Complete Independence Comments: Patient using power chair for mobility 04/22/22 67ft with SBA and B canes      PATIENT EDUCATION:  Education details: Discussed eval findings, rehab rationale and POC and patient is in agreement  Person educated: Patient Education method: Explanation Education comprehension: verbalized  understanding and needs further education   HOME EXERCISE PROGRAM: AAccess Code: [email or web address] URL: [email or web address] Date: 04/22/2022 Prepared by: Gustavus Bryant  Exercises - Seated Long Arc Quad  - 3 x daily - 5 x weekly - 2 sets - 10 reps - Seated Hamstring Stretch  - 3 x daily - 5 x weekly - 1 sets - 2 reps - 30s hold - Seated Heel Slide  - 3 x daily - 5 x weekly - 2 sets - 10 reps - 2s hold  [facility] Adult PT Treatment:                                                DATE: 06/01/22 Therapeutic Exercise: LAQ 10/10 2# Seated ham curls 10x2 B YTB  Seated hamstring stretch 30s x2 B  Supine SLR 2x10 each - small ROM SAQ 2x10 - limited ROM 2# Hooklying knee fallout 15x B, 15/15 unilateral GTB  [facility] Adult PT Treatment:                                                DATE: 05/21/22 Aquatic therapy at [facility]- [address] - therapeutic pool temp 91 degrees in lap pool Pt enters building via W/C and son accompanying. Treatment took place in water 3.8 to 4 ft 8 in.feet deep depending upon activity.  Pt entered and exited the pool via stair and handrails.  Aquatic Exercise: Water walking for warm up forwards, and backwards and side stepping with yellow DB  Walking march with yellow DB x2 laps At edge of pool, pt performed LE exercise Heel raises - 2x20 Hip circles CW/CCW x10 each BIL   Hip abd/add x20 BIL Step ups on submerged step x5 fwd BIL STS x10 Marching in place x1' On submerged bench LAQ x1' Flutter kicks x 1' Scissor kicks x 1'  Pt requires the buoyancy of water for active assisted exercises with buoyancy supported for strengthening and AROM exercises. Hydrostatic pressure also supports joints by unweighting joint load by at least 50 % in 3-4 feet depth water. 80% in chest to neck deep water. Water will provide assistance with movement using the current and laminar flow while the buoyancy reduces weight bearing. Pt requires the viscosity of the water for  resistance with strengthening exercises.  [facility] Adult PT Treatment:                                                DATE: 05/18/22 Therapeutic Exercise: LAQ 10/10  Seated ham curls 10x2 B YTB  Seated hamstring stretch 30s x2 B  Supine SLR 2x10 each - small ROM SAQ 2x10 - limited ROM Hooklying knee fallout 2x10 GTB Hooklying ball squeeze 2x10 - 5" hold  [facility] Adult PT Treatment:                                                DATE: 05/12/21 Therapeutic Exercise: LAQ 10/10  Supine SLR 2x10 each SAQ 2x10 2# Hooklying knee fallout 2x10 GTB Hooklying ball squeeze 2x10 - 5" hold   ASSESSMENT:   CLINICAL IMPRESSION: Pain levels have increased but ROM gains noted in extension B.  Flexion ROM unchanged.  Able to tolerated weight/resistance on exercises today.  Continues with poor tolerance of land based tasks.  Encouraged to f/u with ortho MD regarding knee f/u   EVAL - Patient returns to OPPT for continue treatment to B knees due to underlying pain from OA.  She has been offered B TKA once BMI is in acceptable range.  Patient presents with ROM and strength deficits, limited ambulation tolerance due to pain, weakness and loss of endurance.  Patient uses power chair as main means of transportation/mobility.  Sh is able to ambulate household distances with single cane and furniture surfing.  Patient is a good candidate for continued OPPT consisting of land based PT initially to establish a concise HEP following by a short episode of aquatic PT to establish an independent program using one of the local pool facilities.    OBJECTIVE IMPAIRMENTS: Abnormal gait, decreased activity tolerance, decreased balance, decreased endurance, decreased knowledge of condition, decreased knowledge of use of DME, decreased mobility, difficulty walking, decreased ROM, decreased strength, obesity, and pain.    ACTIVITY LIMITATIONS: carrying, lifting, standing, squatting, stairs, and transfers   PERSONAL FACTORS: Age,  Fitness, Past/current experiences, and Time since onset of injury/illness/exacerbation are also affecting patient's functional outcome.    REHAB POTENTIAL: Fair based on chronicity, unsuccessful conservative interventions and advanced OA of B knees   CLINICAL DECISION MAKING: Stable/uncomplicated   EVALUATION COMPLEXITY: Low     GOALS: Goals reviewed with patient? No   SHORT TERM GOALS: Target date: 02/03/2022   Patient to demonstrate independence in HEP  Baseline:[email or web address] Goal status: Ongoing Pt reports non-compliance 02/12/22   2.  Initiate aquatic program Baseline: TBD Goal status: Met      LONG TERM GOALS: Target date: 06/22/2022     Increase BLE strength to 4-/5 in deficit areas Baseline:  MMT Right eval Left eval R 04/22/22 L 04/22/22  Hip flexion 3 3 3+ 3+  Hip extension        Hip abduction 3 3 3+ 3+  Hip adduction        Hip internal rotation        Hip external rotation        Knee flexion 3+ 3+ 3+ 3+  Knee extension 3+ 3+ 3+ 3+  Ankle dorsiflexion        Ankle plantarflexion 3+ 3+ UTA UTA    Goal status: INITIAL   2.  Increase FOTO score to 56 Baseline: 21 Goal status: INITIAL   3.  Increase B knee extension AROM to -10d B and flexion to 115d B Baseline:    R 01/20/22  L 01/20/22 R 04/22/22 L 04/22/22  Knee flexion 92d 100d 110d 95d  Knee extension -25d -25 -40/-35d -30/-45d   Goal status: INITIAL  4. Increase ambulation distance to 129ft with single cane  Baseline: 50ft with B canes and SBA; 06/01/22 72ft with B canes  Goal Status: Initial  5.  Increase 30 second chair stand reps to 6 with UE support  Baseline: 4 with UE support  Goal Status: Initial       PLAN:   PT FREQUENCY: 1-2x/week   PT DURATION: 4 weeks   PLANNED INTERVENTIONS: Therapeutic exercises, Therapeutic activity, Neuromuscular re-education, Balance training, Gait training, Patient/Family education, Self Care, Joint mobilization, Stair training, DME instructions, and  Re-evaluation   PLAN FOR NEXT SESSION: HEP review and update, ROM, strengthening, gait training and functional activities  Hildred Laser PT  06/01/22 5:45 PM

## 2022-06-01 ENCOUNTER — Ambulatory Visit: Payer: 59 | Attending: Family Medicine

## 2022-06-01 DIAGNOSIS — M6281 Muscle weakness (generalized): Secondary | ICD-10-CM | POA: Diagnosis not present

## 2022-06-01 DIAGNOSIS — M25562 Pain in left knee: Secondary | ICD-10-CM | POA: Insufficient documentation

## 2022-06-01 DIAGNOSIS — G8929 Other chronic pain: Secondary | ICD-10-CM

## 2022-06-01 DIAGNOSIS — M25561 Pain in right knee: Secondary | ICD-10-CM | POA: Insufficient documentation

## 2022-06-03 ENCOUNTER — Ambulatory Visit: Payer: 59

## 2022-06-04 ENCOUNTER — Encounter: Payer: Self-pay | Admitting: Family Medicine

## 2022-06-08 ENCOUNTER — Other Ambulatory Visit (HOSPITAL_COMMUNITY): Payer: Self-pay | Admitting: Surgical

## 2022-06-08 ENCOUNTER — Other Ambulatory Visit (HOSPITAL_COMMUNITY): Payer: Self-pay

## 2022-06-09 ENCOUNTER — Other Ambulatory Visit: Payer: Self-pay

## 2022-06-14 ENCOUNTER — Other Ambulatory Visit: Payer: Self-pay

## 2022-06-14 ENCOUNTER — Other Ambulatory Visit (HOSPITAL_COMMUNITY): Payer: Self-pay | Admitting: Surgical

## 2022-06-14 ENCOUNTER — Other Ambulatory Visit: Payer: Self-pay | Admitting: Family Medicine

## 2022-06-14 ENCOUNTER — Other Ambulatory Visit (HOSPITAL_COMMUNITY): Payer: Self-pay

## 2022-06-14 DIAGNOSIS — I10 Essential (primary) hypertension: Secondary | ICD-10-CM

## 2022-06-14 DIAGNOSIS — E0810 Diabetes mellitus due to underlying condition with ketoacidosis without coma: Secondary | ICD-10-CM

## 2022-06-14 MED ORDER — TECHLITE PEN NEEDLES 31G X 5 MM MISC
11 refills | Status: DC
  Filled 2022-06-14 – 2022-06-15 (×2): qty 100, 100d supply, fill #0
  Filled 2022-10-05: qty 100, 100d supply, fill #1
  Filled 2023-01-27: qty 100, 100d supply, fill #2
  Filled 2023-04-27: qty 100, 100d supply, fill #3
  Filled 2023-05-20: qty 100, 100d supply, fill #4

## 2022-06-14 MED ORDER — AMLODIPINE BESYLATE 5 MG PO TABS
5.0000 mg | ORAL_TABLET | Freq: Every evening | ORAL | 3 refills | Status: DC
  Filled 2022-06-14: qty 90, fill #0
  Filled 2022-06-25: qty 90, 90d supply, fill #0
  Filled 2022-10-05: qty 90, 90d supply, fill #1
  Filled 2023-01-27: qty 90, 90d supply, fill #2
  Filled 2023-04-27: qty 90, 90d supply, fill #3

## 2022-06-14 MED ORDER — FUROSEMIDE 20 MG PO TABS
20.0000 mg | ORAL_TABLET | Freq: Every day | ORAL | 2 refills | Status: DC | PRN
  Filled 2022-06-14: qty 30, 30d supply, fill #0
  Filled 2023-01-27: qty 30, 30d supply, fill #1

## 2022-06-15 ENCOUNTER — Other Ambulatory Visit: Payer: Self-pay

## 2022-06-16 ENCOUNTER — Telehealth: Payer: Self-pay

## 2022-06-16 NOTE — Progress Notes (Signed)
Patient attempted to be outreached by Rod Majerus, PharmD Candidate on 06/16/22 to discuss hypertension. Left voicemail for patient to return our call at their convenience at [phone].  Rama Sorci, Student-PharmD  

## 2022-06-17 ENCOUNTER — Encounter: Payer: Self-pay | Admitting: Physical Therapy

## 2022-06-17 ENCOUNTER — Ambulatory Visit: Payer: 59 | Admitting: Physical Therapy
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
## 2022-06-18 ENCOUNTER — Encounter: Payer: Self-pay | Admitting: Family Medicine

## 2022-06-18 ENCOUNTER — Ambulatory Visit (INDEPENDENT_AMBULATORY_CARE_PROVIDER_SITE_OTHER): Payer: 59 | Admitting: Family Medicine

## 2022-06-18 ENCOUNTER — Other Ambulatory Visit (HOSPITAL_COMMUNITY): Payer: Self-pay

## 2022-06-18 VITALS — BP 125/83 | HR 104 | Wt 343.6 lb

## 2022-06-18 DIAGNOSIS — R52 Pain, unspecified: Secondary | ICD-10-CM

## 2022-06-18 DIAGNOSIS — Z6841 Body Mass Index (BMI) 40.0 and over, adult: Secondary | ICD-10-CM | POA: Diagnosis not present

## 2022-06-18 DIAGNOSIS — E1165 Type 2 diabetes mellitus with hyperglycemia: Secondary | ICD-10-CM | POA: Diagnosis not present

## 2022-06-18 DIAGNOSIS — Z794 Long term (current) use of insulin: Secondary | ICD-10-CM | POA: Diagnosis not present

## 2022-06-18 DIAGNOSIS — M174 Other bilateral secondary osteoarthritis of knee: Secondary | ICD-10-CM | POA: Diagnosis not present

## 2022-06-18 MED ORDER — LANTUS SOLOSTAR 100 UNIT/ML ~~LOC~~ SOPN
40.0000 [IU] | PEN_INJECTOR | Freq: Every day | SUBCUTANEOUS | 6 refills | Status: DC
  Filled 2022-06-18 – 2022-06-22 (×2): qty 15, 37d supply, fill #0

## 2022-06-18 MED ORDER — KETOROLAC TROMETHAMINE 30 MG/ML IJ SOLN
30.0000 mg | Freq: Once | INTRAMUSCULAR | Status: AC
  Administered 2022-06-18: 30 mg via INTRAMUSCULAR

## 2022-06-18 MED ORDER — OXYCODONE HCL 5 MG PO TABS
5.0000 mg | ORAL_TABLET | Freq: Four times a day (QID) | ORAL | 0 refills | Status: DC | PRN
  Filled 2022-06-18 (×2): qty 30, 8d supply, fill #0

## 2022-06-18 MED ORDER — MOUNJARO 7.5 MG/0.5ML ~~LOC~~ SOAJ
7.5000 mg | SUBCUTANEOUS | 3 refills | Status: DC
  Filled 2022-06-18 (×2): qty 2, 28d supply, fill #0

## 2022-06-18 NOTE — Progress Notes (Unsigned)
SUBJECTIVE:   CHIEF COMPLAINT / HPI:   Chronic pain, unfilled scripts [name] presents today with uncontrolled chronic pain that is interfering with PT. She reports difficulty obtaining her Mounjaro and oxycodone from the pharmacy, despite multiple requests. Unclear why either has been held up.   Known chronic low back pain and bilateral knee pain (end stage tri-compartment knee OA) - still has lyrica and voltaren tablet - no helping much on their own - works best with the oxycodone  Last night was in so much pain, took 3 ibuprofen and 4 tylenol without much relief  Disruption to PT - office PT provider stopped PT treatments because of her severe pain, told her it needed to be under better control in order to be beneficial - has been in aquatic therapy but is at risk of not being able to go because of pain  T2DM Glucose in 200s per CGM Does not currently have Mounjaro Currently taking: Lantus 40 units, Jardiance 10 Losing bladder 3 or 4 times per day  PERTINENT  PMH / PSH:  Patient Active Problem List   Diagnosis Date Noted   Weight loss advised 03/07/2022   Drug-induced constipation 12/09/2021   Anxiety 09/23/2021   Chronic right shoulder pain 03/24/2021   Left wrist pain 03/24/2021   Diabetic neuropathy (HCC) 02/25/2021   Healthcare maintenance 10/21/2020   Hyperlipidemia (primary prevention, LDL goal <70) 09/04/2020   Wheelchair dependence 05/05/2020   Carpal tunnel syndrome of right wrist, RECURRENT 10/15/2019   Insomnia, idiopathic 09/30/2018   Grade I diastolic dysfunction 09/30/2018   Encounter for chronic pain management 02/11/2014   Diabetes (HCC) 02/11/2014   Cystocele 09/14/2012   Chronic pain of both knees 05/28/2010   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   Allergic rhinitis 05/30/2008   Carpal tunnel syndrome of left wrist 01/17/2008   OBESITY HYPOVENTILATION SYNDROME 12/27/2007   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    Obstructive sleep apnea 10/30/2007   BMI 50.0-59.9, adult (HCC) 04/10/2007   MYASTHENIA 03/07/2007   Depression, recurrent (HCC) 03/24/2006   Essential (primary) hypertension 03/24/2006   Gastro-esophageal reflux disease without esophagitis 03/24/2006    OBJECTIVE:   BP 125/83   Pulse (!) 104   Wt (!) 343 lb 9.6 oz (155.9 kg)   SpO2 100%   BMI 60.87 kg/m    PHQ-9:     03/05/2022    4:17 PM 01/08/2022   11:29 AM 12/08/2021    4:54 PM  Depression screen PHQ 2/9  Decreased Interest 2 0 2  Down, Depressed, Hopeless 1 0 1  PHQ - 2 Score 3 0 3  Altered sleeping 3  2  Tired, decreased energy 2  2  Change in appetite 2  3  Feeling bad or failure about yourself  2  2  Trouble concentrating 2  2  Moving slowly or fidgety/restless 2  2  Suicidal thoughts 0  0  PHQ-9 Score 16  16  Difficult doing work/chores Very difficult  Extremely dIfficult    Physical Exam General: Awake, alert, oriented, no acute distress Respiratory: Unlabored respirations, speaking in full sentences, no respiratory distress Extremities: Moving all extremities spontaneously Neuro: Cranial nerves II through X grossly intact Psych: Normal insight and judgement   ASSESSMENT/PLAN:   DJD (degenerative joint disease) of knee - refill oxycodone (problem was MyChart was sending to wrong provider and not myself) - continue aquatic therapy twice weekly - call to get back in with in-office PT, hopefully twice weekly -  I will authorize extra oxycodone for PT sessions to assist - continue with [facility] for T2DM and weight loss efforts in order to get closer to surgery  Diabetes [facility]) - called and spoke with pharmacy, [facility] approved by insurance - re-start Mounjaro - continue Lantus 40 units daily - STOP Jardiance due to incontinence, restart once glucose under better control   Spoke with Judie Grieve at [facility] to sort out oxycodone and Drake Leach, MD [facility]

## 2022-06-18 NOTE — Patient Instructions (Addendum)
It was wonderful to see you today. Thank you for allowing me to be a part of your care. Below is a short summary of what we discussed at your visit today:  Medication refills  Looks like the oxycodone script was being sent to an old physician assistant who is not currently caring for you. I have sent a refill to your pharmacy.   I have spoken with Judie Grieve at the [facility]. We have straightened out both prescriptions.  [facility] hours Friday until 6 pm Saturday 8 am - 4:30 pm  Diabetes Please come back in about one month to see your A1c and check on your diabetes.  I have sent a refill of Lantus insulin so you do not run out.  Continue taking Lantus 40 units every day. Adjust down once your fasting morning blood sugars reach below 120.  The [facility] will be ready for pick up today.   Please bring all of your medications to every appointment! If you have any questions or concerns, please do not hesitate to contact us via phone or MyChart message.   Fayette Pho, MD

## 2022-06-19 ENCOUNTER — Other Ambulatory Visit (HOSPITAL_COMMUNITY): Payer: Self-pay

## 2022-06-20 NOTE — Assessment & Plan Note (Signed)
-   refill oxycodone (problem was MyChart was sending to wrong provider and not myself) - continue aquatic therapy twice weekly - call to get back in with in-office PT, hopefully twice weekly - I will authorize extra oxycodone for PT sessions to assist - continue with [facility] for T2DM and weight loss efforts in order to get closer to surgery

## 2022-06-20 NOTE — Assessment & Plan Note (Signed)
-   called and spoke with pharmacy, [facility] Keen approved by insurance - re-start Mounjaro - continue Lantus 40 units daily - STOP Jardiance due to incontinence, restart once glucose under better control

## 2022-06-22 ENCOUNTER — Other Ambulatory Visit: Payer: Self-pay | Admitting: Family Medicine

## 2022-06-22 DIAGNOSIS — M1712 Unilateral primary osteoarthritis, left knee: Secondary | ICD-10-CM | POA: Diagnosis not present

## 2022-06-22 DIAGNOSIS — M170 Bilateral primary osteoarthritis of knee: Secondary | ICD-10-CM | POA: Diagnosis not present

## 2022-06-22 DIAGNOSIS — M1711 Unilateral primary osteoarthritis, right knee: Secondary | ICD-10-CM | POA: Diagnosis not present

## 2022-06-22 DIAGNOSIS — G8929 Other chronic pain: Secondary | ICD-10-CM

## 2022-06-22 MED FILL — Glucose Blood Test Strip: 33 days supply | Qty: 100 | Fill #6 | Status: AC

## 2022-06-23 ENCOUNTER — Telehealth: Payer: Self-pay

## 2022-06-23 ENCOUNTER — Other Ambulatory Visit (HOSPITAL_COMMUNITY): Payer: Self-pay

## 2022-06-23 ENCOUNTER — Ambulatory Visit: Payer: 59 | Admitting: Physical Therapy

## 2022-06-23 ENCOUNTER — Other Ambulatory Visit: Payer: Self-pay

## 2022-06-23 ENCOUNTER — Other Ambulatory Visit (HOSPITAL_BASED_OUTPATIENT_CLINIC_OR_DEPARTMENT_OTHER): Payer: Self-pay

## 2022-06-23 MED ORDER — PREGABALIN 75 MG PO CAPS
75.0000 mg | ORAL_CAPSULE | Freq: Two times a day (BID) | ORAL | 3 refills | Status: DC
  Filled 2022-06-23: qty 180, 90d supply, fill #0
  Filled 2023-01-27: qty 180, 90d supply, fill #1
  Filled 2023-04-27: qty 180, 90d supply, fill #2

## 2022-06-24 ENCOUNTER — Other Ambulatory Visit: Payer: Self-pay

## 2022-06-24 ENCOUNTER — Ambulatory Visit: Payer: 59 | Admitting: Physical Therapy

## 2022-06-24 ENCOUNTER — Ambulatory Visit: Payer: 59

## 2022-06-24 DIAGNOSIS — M25561 Pain in right knee: Secondary | ICD-10-CM | POA: Diagnosis not present

## 2022-06-24 DIAGNOSIS — M25562 Pain in left knee: Secondary | ICD-10-CM | POA: Diagnosis not present

## 2022-06-24 DIAGNOSIS — G8929 Other chronic pain: Secondary | ICD-10-CM

## 2022-06-24 DIAGNOSIS — M6281 Muscle weakness (generalized): Secondary | ICD-10-CM

## 2022-06-24 NOTE — Therapy (Signed)
OUTPATIENT PHYSICAL THERAPY TREATMENT NOTE  Patient Name: [name] MRN: [id number] DOB:04-14-1965, 57 y.o., female Today's Date: 06/25/2022  PCP: Fayette Pho, MD    REFERRING PROVIDER: Otho Darner, MD    PT End of Session - 06/25/22 1556     Visit Number 16    Number of Visits 27    Date for PT Re-Evaluation 08/17/22    Authorization Type [employer]    PT Start Time 1600    PT Stop Time 1645    PT Time Calculation (min) 45 min    Activity Tolerance Patient limited by pain    Behavior During Therapy [facility] for tasks assessed/performed              Past Medical History:  Diagnosis Date   ALLERGIC RHINITIS    takes Zyrtec daily   Anxiety    Arthritis    Asthma    Albuterol prn;Symbicort daily and Singulair daily   Bronchitis    hx of   CAP (community acquired pneumonia) 02/20/2015   Carpal tunnel syndrome    right   Chronic back pain    Constipation    Miralax prn   Depression    takes Cymbalta daily   Diabetes (HCC)    type 2    Eczema    Gallstones 2010   GERD (gastroesophageal reflux disease)    takes Omeprazole daily   Hemorrhoids    Hypertension    takes Prinizide daily and Clonidine on Mondays   Hypoventilation associated with obesity syndrome (HCC)    Insomnia    Irritable bladder    Joint pain    Morbid (severe) obesity due to excess calories (HCC) 04/10/2007   Restrictive changes on pfts 11/30/2007     Myasthenia gravis 1994   Positive Acetylcholine receptor Ab and single fiber EMG Dr. Clar[facility]- Dr. 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  Coralyn Helling- 10/30/2007 ANA positive, ANA titer  negative, RF less than 20   Rheumatoid arthritis(714.0)    Sarcoidosis 12/23/2010   Derm  dx NCG  12/03/10 (path report in EPIC)    Overview:  Overview:  Diagnosed on skin biopsy November 2012  Last Assessment & Plan:  Labs are unremarkable. CXR improved/stable. PFT wnl. I am happy patient had these tests and we have a baseline for her. At this time, I do not see an indication for starting chronic steroids daily. Patient agrees. Her skin lesions are bothering her the most and syst   Shortness of breath    can be sitting as well as exertion   Skin irritation    skin itchy   Trigger finger    Urinary frequency    takes Ditropan daily   Viral meningitis    history of viral meningitis   Past Surgical History:  Procedure Laterality Date   BRONCHOSCOPY  08/2007   CARPAL TUNNEL RELEASE  05/20/2011   Procedure: CARPAL TUNNEL RELEASE;  Surgeon: [name], MD;  Location: [facility] OR;  Service: Orthopedics;  Laterality: Right;   CARPAL  TUNNEL RELEASE Right 11/22/2019   Procedure: RIGHT HAND CARPAL TUNNEL RELEASE;  Surgeon: Jones Broom, MD;  Location: [facility] ORS;  Service: Orthopedics;  Laterality: Right;   CARPAL TUNNEL RELEASE Left 02/25/2022   Procedure: CARPAL TUNNEL RELEASE;  Surgeon: Jones Broom, MD;  Location: [facility] ORS;  Service: Orthopedics;  Laterality: Left;   CESAREAN SECTION  09/15/2004   COLONOSCOPY N/A 01/21/2014   Procedure: COLONOSCOPY;  Surgeon: Iva Boop, MD;  Location: [facility] ENDOSCOPY;  Service: Endoscopy;  Laterality: N/A;   cortisone injection     receives an injection every 3months   ESOPHAGOGASTRODUODENOSCOPY     ESOPHAGOGASTRODUODENOSCOPY N/A 01/21/2014   Procedure: ESOPHAGOGASTRODUODENOSCOPY (EGD);  Surgeon: Iva Boop, MD;  Location: [facility];  Service: Endoscopy;  Laterality: N/A;   LAPAROSCOPIC CHOLECYSTECTOMY  07/2008   by Dr. Cyndia Bent   Thymus resection  08/25/1993   TRIGGER FINGER RELEASE   05/20/2011   Procedure: RELEASE TRIGGER FINGER/A-1 PULLEY;  Surgeon: [name], MD;  Location: [facility] OR;  Service: Orthopedics;  Laterality: Right;  RIGHT TRIGGER THUMB RELEASE AND RIGHT CARPAL TUNNEL RELEASE   Patient Active Problem List   Diagnosis Date Noted   Weight loss advised 03/07/2022   Drug-induced constipation 12/09/2021   Anxiety 09/23/2021   Chronic right shoulder pain 03/24/2021   Left wrist pain 03/24/2021   Diabetic neuropathy (HCC) 02/25/2021   Healthcare maintenance 10/21/2020   Hyperlipidemia (primary prevention, LDL goal <70) 09/04/2020   Wheelchair dependence 05/05/2020   Carpal tunnel syndrome of right wrist, RECURRENT 10/15/2019   Insomnia, idiopathic 09/30/2018   Grade I diastolic dysfunction 09/30/2018   Encounter for chronic pain management 02/11/2014   Diabetes (HCC) 02/11/2014   Cystocele 09/14/2012   Chronic pain of both knees 05/28/2010   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   Allergic rhinitis 05/30/2008   Carpal tunnel syndrome of left wrist 01/17/2008   OBESITY HYPOVENTILATION SYNDROME 12/27/2007   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   Obstructive sleep apnea 10/30/2007   BMI 50.0-59.9, adult (HCC) 04/10/2007   MYASTHENIA 03/07/2007   Depression, recurrent (HCC) 03/24/2006   Essential (primary) hypertension 03/24/2006   Gastro-esophageal reflux disease without esophagitis 03/24/2006    THERAPY DIAG:  Chronic pain of left knee  Chronic pain of right knee  Muscle weakness (generalized)   Rationale for Evaluation and Treatment Rehabilitation  REFERRING DIAG: BILATERAL KNEE OSTEOARTHRITIS :HIP MUSCLE ; KNEE FLEXION   PERTINENT HISTORY:  HPI Patient is 57 year old female comes in today with bilateral knee pain second opinion.  She has been told that she has arthritis in both knees.  She was seen at [facility] orthopedics recently and told she needed to work on strengthening her legs and they have  actually ordered therapy.  They also discussed with her that she needed to work on weight loss which she is doing.  She states she has had bilateral knee pain for some 30 years.  No known injury.  Radiographs in 2019 are available and show severe tricompartmental arthritis of the right knee.  She does take diclofenac Tylenol Lyrica and oxycodone for her knee pain but states this really helped.  She is also tried injections both cortisone and gel injections without any real relief.  She notes that she has been basically dependent upon a motorized wheelchair for the last 2 years.  She does ambulate short distances though.  She states prior to the motorized wheelchair she was walking with a walker.  She is lost from 430 pounds down to  316 pounds.  States due to her myasthenia gravis she has been on prednisone.  Has gained a lot of weight over the years.  She has also had weakness in the extremities for years.  Reports that her diabetes is under good control at this point in time.  PRECAUTIONS/RESTRICTIONS:   FALL  SUBJECTIVE: Patient reports continued BIL knee pain, states that they have been somewhat better since the cortisone injection on Tuesday.   PAIN:  Are you having pain? Yes: NPRS scale: 5/10 (R>L) Pain location: B knees Pain description: ache Aggravating factors: everything Relieving factors: nothing  OBJECTIVE: (objective measures completed at initial evaluation unless otherwise dated)  DIAGNOSTIC FINDINGS: CLINICAL DATA:  57 year old female with a history of chronic right knee pain   EXAM: RIGHT KNEE 3 VIEWS   COMPARISON:  08/03/2013   FINDINGS: No acute displaced fracture. Advanced joint space narrowing of the mediolateral compartment with marginal osteophyte formation, sclerotic changes. Degenerative changes of the patellofemoral joint. Changes have not progressed since the comparison of 08/03/2013   IMPRESSION: Advanced tricompartmental osteoarthritis, worse than the  comparison study of 2015.   No acute bony abnormality identified.     Electronically Signed   By: Gilmer Mor D.O.   On: 11/09/2017 09:34     PATIENT SURVEYS:  FOTO 21(56 predicted)    COGNITION: Overall cognitive status: Within functional limits for tasks assessed                         SENSATION: Not tested   MUSCLE LENGTH: UTA due to pain levels   POSTURE:  UTA due to poor standing tolerance   PALPATION: deferred   LOWER EXTREMITY ROM:   Active ROM Right eval Left eval R 04/22/22 L  04/22/22 R 06/01/22 L  06/01/22 R 06/24/22 L 06/24/22  Hip flexion            Hip extension            Hip abduction            Hip adduction            Hip internal rotation            Hip external rotation            Knee flexion 92d 100d 110d 95d 110d 98d 104d 96d  Knee extension -25d -25 -40/-35d -30/-45d -30d -25d -33d -32d  Ankle dorsiflexion            Ankle plantarflexion            Ankle inversion            Ankle eversion             (Blank rows = not tested)   LOWER EXTREMITY MMT:   MMT Right eval Left eval R 04/22/22 L 04/22/22 R 06/24/22 L  06/24/22  Hip flexion 3 3 3+ 3+ 3+ 3+  Hip extension          Hip abduction 3 3 3+ 3+ 3+ 3+  Hip adduction          Hip internal rotation          Hip external rotation          Knee flexion 3+ 3+ 3+ 3+ 3+ 3+  Knee extension 3+ 3+ 3+ 3+ 3+ 3+  Ankle dorsiflexion          Ankle plantarflexion 3+ 3+ UTA UTA  Ankle inversion          Ankle eversion           PF strength testing deferred due to B achilles pain.   LOWER EXTREMITY SPECIAL TESTS:  UTA due to pain and limited tolerance to testing positions    FUNCTIONAL TESTS:  30 seconds chair stand test 0 reps 04/22/22 30s chair stand test 4 reps with UE support 06/24/22 30s chair stand 7 reps with UE support   GAIT: Distance walked: 25ft Assistive device utilized: Wheelchair (power) Level of assistance: Complete Independence Comments: Patient using power chair for  mobility 04/22/22 68ft with SBA and B canes      PATIENT EDUCATION:  Education details: Discussed eval findings, rehab rationale and POC and patient is in agreement  Person educated: Patient Education method: Explanation Education comprehension: verbalized understanding and needs further education   HOME EXERCISE PROGRAM: AAccess Code: [email or web address] URL: [email or web address] Date: 04/22/2022 Prepared by: Gustavus Bryant  Exercises - Seated Long Arc Quad  - 3 x daily - 5 x weekly - 2 sets - 10 reps - Seated Hamstring Stretch  - 3 x daily - 5 x weekly - 1 sets - 2 reps - 30s hold - Seated Heel Slide  - 3 x daily - 5 x weekly - 2 sets - 10 reps - 2s hold  [facility] Adult PT Treatment:                                                DATE: 06/25/22 Aquatic therapy at [facility]- [address] - therapeutic pool temp 91 degrees in lap pool Pt enters building via W/C and son accompanying. Treatment took place in water 3.8 to 4 ft 8 in.feet deep depending upon activity.  Pt entered and exited the pool via stair and handrails.  Aquatic Exercise: Water walking for warm up and cool down - forwards/backwards/side stepping with blue DB  Sidestepping with shoulder abd/add blue DB x 2 laps Walking march with blue DB x2 laps Back against wall with blue DB: shoulder flexion/ext, abd/add, horizontal abd/add Trunk rotation and straight leg kick with blue DB x20 BIL Water jogging on place with blue DB x5' At edge of pool, pt performed LE exercise Heel raises - 2x20 Hip extension x20 BIL Mini squats x20 Hip circles CW/CCW x10 each BIL Hip flexion/extension knee straight x20 BIL   Hip abd/add x20 BIL   Pt requires the buoyancy of water for active assisted exercises with buoyancy supported for strengthening and AROM exercises. Hydrostatic pressure also supports joints by unweighting joint load by at least 50 % in 3-4 feet depth water. 80% in chest to neck deep water. Water will provide  assistance with movement using the current and laminar flow while the buoyancy reduces weight bearing. Pt requires the viscosity of the water for resistance with strengthening exercises.   [facility] Adult PT Treatment:                                                DATE: 06/24/62  Therapeutic Activity: Gait 85ft with S and B canes A/PROM B knees 30s chair stand test Re-assess progress towards goals  [facility] Adult PT Treatment:  DATE: 06/01/22 Therapeutic Exercise: LAQ 10/10 2# Seated ham curls 10x2 B YTB  Seated hamstring stretch 30s x2 B  Supine SLR 2x10 each - small ROM SAQ 2x10 - limited ROM 2# Hooklying knee fallout 15x B, 15/15 unilateral GTB    ASSESSMENT:   CLINICAL IMPRESSION:  Patient presents to aquatic PT reporting continued BIL knee pain, especially when attempting to walk, but states that it has improved since receiving cortisone injections this week. Session today focused on BIL LE strengthening and general conditioning in the aquatic environment for use of buoyancy to offload joints and the viscosity of water as resistance during therapeutic exercise. Patient was able to tolerate all prescribed exercises with no adverse effects.    EVAL - Patient returns to OPPT for continue treatment to B knees due to underlying pain from OA.  She has been offered B TKA once BMI is in acceptable range.  Patient presents with ROM and strength deficits, limited ambulation tolerance due to pain, weakness and loss of endurance.  Patient uses power chair as main means of transportation/mobility.  Sh is able to ambulate household distances with single cane and furniture surfing.  Patient is a good candidate for continued OPPT consisting of land based PT initially to establish a concise HEP following by a short episode of aquatic PT to establish an independent program using one of the local pool facilities.    OBJECTIVE IMPAIRMENTS: Abnormal gait, decreased  activity tolerance, decreased balance, decreased endurance, decreased knowledge of condition, decreased knowledge of use of DME, decreased mobility, difficulty walking, decreased ROM, decreased strength, obesity, and pain.    ACTIVITY LIMITATIONS: carrying, lifting, standing, squatting, stairs, and transfers   PERSONAL FACTORS: Age, Fitness, Past/current experiences, and Time since onset of injury/illness/exacerbation are also affecting patient's functional outcome.    REHAB POTENTIAL: Fair based on chronicity, unsuccessful conservative interventions and advanced OA of B knees   CLINICAL DECISION MAKING: Stable/uncomplicated   EVALUATION COMPLEXITY: Low     GOALS: Goals reviewed with patient? No   SHORT TERM GOALS: Target date: 02/03/2022   Patient to demonstrate independence in HEP  Baseline:[email or web address] Goal status: Ongoing Pt reports non-compliance 02/12/22   2.  Initiate aquatic program Baseline: TBD Goal status: Met      LONG TERM GOALS: Target date: 08/22/2022     Increase BLE strength to 4-/5 in deficit areas Baseline:  MMT Right eval Left eval R 04/22/22 L 04/22/22 R 06/24/22 L  06/24/22  Hip flexion 3 3 3+ 3+ 3+ 3+  Hip extension          Hip abduction 3 3 3+ 3+ 3+ 3+  Hip adduction          Hip internal rotation          Hip external rotation          Knee flexion 3+ 3+ 3+ 3+ 3+ 3+  Knee extension 3+ 3+ 3+ 3+ 3+ 3+  Ankle dorsiflexion          Ankle plantarflexion 3+ 3+ UTA UTA    Ankle inversion          Ankle eversion            Goal status: INITIAL   2.  Increase FOTO score to 56 Baseline: 21 Goal status: INITIAL   3.  Increase B knee extension AROM to -10d B and flexion to 115d B Baseline:  Active ROM Right eval Left eval R 04/22/22 L  04/22/22 R 06/01/22 L  06/01/22  R 06/24/22 L 06/24/22  Hip flexion            Hip extension            Hip abduction            Hip adduction            Hip internal rotation            Hip external rotation             Knee flexion 92d 100d 110d 95d 110d 98d 104d 96d  Knee extension -25d -25 -40/-35d -30/-45d -30d -25d -33d -32d   Goal status: INITIAL  4. Increase ambulation distance to 173ft with single cane  Baseline: 82ft with B canes and SBA; 06/01/22 57ft with B canes  Goal Status: Initial  5. Increase 30 second chair stand reps to 6 with UE support  Baseline: 4 with UE support  Goal Status: Initial       PLAN:   PT FREQUENCY: 2x/week   PT DURATION: 6 weeks   PLANNED INTERVENTIONS: Therapeutic exercises, Therapeutic activity, Neuromuscular re-education, Balance training, Gait training, Patient/Family education, Self Care, Joint mobilization, Stair training, DME instructions, and Re-evaluation, aquatic therapy   PLAN FOR NEXT SESSION: HEP review and update, ROM, strengthening, gait training and functional activities  Berta Minor PTA  06/25/22 3:56 PM

## 2022-06-24 NOTE — Therapy (Signed)
OUTPATIENT PHYSICAL THERAPY TREATMENT NOTE  Patient Name: SUZY PONTE MRN: [id number] DOB:[birth year], 57 y.o., female Today's Date: 06/24/2022  PCP: Fayette Pho, MD    REFERRING PROVIDER: Otho Darner, MD    PT End of Session - 06/24/22 1545     Visit Number 15    Number of Visits 27    Date for PT Re-Evaluation 08/17/22    Authorization Type U[employer]    PT Start Time 1340    PT Stop Time 1410    PT Time Calculation (min) 30 min    Activity Tolerance Patient limited by pain    Behavior During Therapy [facility] for tasks assessed/performed               Past Medical History:  Diagnosis Date   ALLERGIC RHINITIS    takes Zyrtec daily   Anxiety    Arthritis    Asthma    Albuterol prn;Symbicort daily and Singulair daily   Bronchitis    hx of   CAP (community acquired pneumonia) 02/20/2015   Carpal tunnel syndrome    right   Chronic back pain    Constipation    Miralax prn   Depression    takes Cymbalta daily   Diabetes (HCC)    type 2    Eczema    Gallstones 2010   GERD (gastroesophageal reflux disease)    takes Omeprazole daily   Hemorrhoids    Hypertension    takes Prinizide daily and Clonidine on Mondays   Hypoventilation associated with obesity syndrome (HCC)    Insomnia    Irritable bladder    Joint pain    Morbid (severe) obesity due to excess calories (HCC) 04/10/2007   Restrictive changes on pfts 11/30/2007     Myasthenia gravis 1994   Positive Acetylcholine receptor Ab and single fiber EMG Dr. Clarisa [facility] 1996- Dr. 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  Coralyn Helling- 10/30/2007 ANA positive, ANA titer  negative, RF less than 20   Rheumatoid arthritis(714.0)    Sarcoidosis 12/23/2010   Derm  dx NCG  12/03/10 (path report in EPIC)    Overview:  Overview:  Diagnosed on skin biopsy November 2012  Last Assessment & Plan:  Labs are unremarkable. CXR improved/stable. PFT wnl. I am happy patient had these tests and we have a baseline for her. At this time, I do not see an indication for starting chronic steroids daily. Patient agrees. Her skin lesions are bothering her the most and syst   Shortness of breath    can be sitting as well as exertion   Skin irritation    skin itchy   Trigger finger    Urinary frequency    takes Ditropan daily   Viral meningitis    history of viral meningitis   Past Surgical History:  Procedure Laterality Date   BRONCHOSCOPY  08/2007   CARPAL TUNNEL RELEASE  05/20/2011   Procedure: CARPAL TUNNEL RELEASE;  Surgeon: [name], MD;  Location: [facility] OR;  Service: Orthopedics;  Laterality: Right;  CARPAL TUNNEL RELEASE Right 11/22/2019   Procedure: RIGHT HAND CARPAL TUNNEL RELEASE;  Surgeon: Jones Broom, MD;  Location: [facility] ORS;  Service: Orthopedics;  Laterality: Right;   CARPAL TUNNEL RELEASE Left 02/25/2022   Procedure: CARPAL TUNNEL RELEASE;  Surgeon: Jones Broom, MD;  Location: [facility] ORS;  Service: Orthopedics;  Laterality: Left;   CESAREAN SECTION  09/15/2004   COLONOSCOPY N/A 01/21/2014   Procedure: COLONOSCOPY;  Surgeon: Iva Boop, MD;  Location: [facility] ENDOSCOPY;  Service: Endoscopy;  Laterality: N/A;   cortisone injection     receives an injection every 3months   ESOPHAGOGASTRODUODENOSCOPY     ESOPHAGOGASTRODUODENOSCOPY N/A 01/21/2014   Procedure: ESOPHAGOGASTRODUODENOSCOPY (EGD);  Surgeon: Iva Boop, MD;  Location: [facility];  Service: Endoscopy;  Laterality: N/A;   LAPAROSCOPIC CHOLECYSTECTOMY  07/2008   by Dr. Cyndia Bent   Thymus resection  08/25/1993   TRIGGER FINGER RELEASE   05/20/2011   Procedure: RELEASE TRIGGER FINGER/A-1 PULLEY;  Surgeon: [name], MD;  Location: [facility] OR;  Service: Orthopedics;  Laterality: Right;  RIGHT TRIGGER THUMB RELEASE AND RIGHT CARPAL TUNNEL RELEASE   Patient Active Problem List   Diagnosis Date Noted   Weight loss advised 03/07/2022   Drug-induced constipation 12/09/2021   Anxiety 09/23/2021   Chronic right shoulder pain 03/24/2021   Left wrist pain 03/24/2021   Diabetic neuropathy (HCC) 02/25/2021   Healthcare maintenance 10/21/2020   Hyperlipidemia (primary prevention, LDL goal <70) 09/04/2020   Wheelchair dependence 05/05/2020   Carpal tunnel syndrome of right wrist, RECURRENT 10/15/2019   Insomnia, idiopathic 09/30/2018   Grade I diastolic dysfunction 09/30/2018   Encounter for chronic pain management 02/11/2014   Diabetes (HCC) 02/11/2014   Cystocele 09/14/2012   Chronic pain of both knees 05/28/2010   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   Allergic rhinitis 05/30/2008   Carpal tunnel syndrome of left wrist 01/17/2008   OBESITY HYPOVENTILATION SYNDROME 12/27/2007   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   Obstructive sleep apnea 10/30/2007   BMI 50.0-59.9, adult (HCC) 04/10/2007   MYASTHENIA 03/07/2007   Depression, recurrent (HCC) 03/24/2006   Essential (primary) hypertension 03/24/2006   Gastro-esophageal reflux disease without esophagitis 03/24/2006    THERAPY DIAG:  Chronic pain of left knee  Chronic pain of right knee  Muscle weakness (generalized)   Rationale for Evaluation and Treatment Rehabilitation  REFERRING DIAG: BILATERAL KNEE OSTEOARTHRITIS :HIP MUSCLE ; KNEE FLEXION   PERTINENT HISTORY:  HPI Patient is 57 year old female comes in today with bilateral knee pain second opinion.  She has been told that she has arthritis in both knees.  She was seen at [facility] orthopedics recently and told she needed to work on strengthening her legs and they have  actually ordered therapy.  They also discussed with her that she needed to work on weight loss which she is doing.  She states she has had bilateral knee pain for some 30 years.  No known injury.  Radiographs in 2019 are available and show severe tricompartmental arthritis of the right knee.  She does take diclofenac Tylenol Lyrica and oxycodone for her knee pain but states this really helped.  She is also tried injections both cortisone and gel injections without any real relief.  She notes that she has been basically dependent upon a motorized wheelchair for the last 2 years.  She does ambulate short distances though.  She states prior to the motorized wheelchair she was walking with a walker.  She is lost from 430 pounds down  to 316 pounds.  States due to her myasthenia gravis she has been on prednisone.  Has gained a lot of weight over the years.  She has also had weakness in the extremities for years.  Reports that her diabetes is under good control at this point in time.  PRECAUTIONS/RESTRICTIONS:   FALL  SUBJECTIVE: B knee pain rated at 5/10 since cortisone injection 06/22/22.  Has new referral to continue OPPT for 6 additional weeks and return to MD forf/u at that time   PAIN:  Are you having pain? Yes: NPRS scale: 5/10 (R>L) Pain location: B knees Pain description: ache Aggravating factors: everything Relieving factors: nothing  OBJECTIVE: (objective measures completed at initial evaluation unless otherwise dated)  DIAGNOSTIC FINDINGS: CLINICAL DATA:  57 year old female with a history of chronic right knee pain   EXAM: RIGHT KNEE 3 VIEWS   COMPARISON:  08/03/2013   FINDINGS: No acute displaced fracture. Advanced joint space narrowing of the mediolateral compartment with marginal osteophyte formation, sclerotic changes. Degenerative changes of the patellofemoral joint. Changes have not progressed since the comparison of 08/03/2013   IMPRESSION: Advanced tricompartmental  osteoarthritis, worse than the comparison study of 2015.   No acute bony abnormality identified.     Electronically Signed   By: Gilmer Mor D.O.   On: 11/09/2017 09:34     PATIENT SURVEYS:  FOTO 21(56 predicted)    COGNITION: Overall cognitive status: Within functional limits for tasks assessed                         SENSATION: Not tested   MUSCLE LENGTH: UTA due to pain levels   POSTURE:  UTA due to poor standing tolerance   PALPATION: deferred   LOWER EXTREMITY ROM:   Active ROM Right eval Left eval R 04/22/22 L  04/22/22 R 06/01/22 L  06/01/22 R 06/24/22 L 06/24/22  Hip flexion            Hip extension            Hip abduction            Hip adduction            Hip internal rotation            Hip external rotation            Knee flexion 92d 100d 110d 95d 110d 98d 104d 96d  Knee extension -25d -25 -40/-35d -30/-45d -30d -25d -33d -32d  Ankle dorsiflexion            Ankle plantarflexion            Ankle inversion            Ankle eversion             (Blank rows = not tested)   LOWER EXTREMITY MMT:   MMT Right eval Left eval R 04/22/22 L 04/22/22 R 06/24/22 L  06/24/22  Hip flexion 3 3 3+ 3+ 3+ 3+  Hip extension          Hip abduction 3 3 3+ 3+ 3+ 3+  Hip adduction          Hip internal rotation          Hip external rotation          Knee flexion 3+ 3+ 3+ 3+ 3+ 3+  Knee extension 3+ 3+ 3+ 3+ 3+ 3+  Ankle dorsiflexion  Ankle plantarflexion 3+ 3+ UTA UTA    Ankle inversion          Ankle eversion           PF strength testing deferred due to B achilles pain.   LOWER EXTREMITY SPECIAL TESTS:  UTA due to pain and limited tolerance to testing positions    FUNCTIONAL TESTS:  30 seconds chair stand test 0 reps 04/22/22 30s chair stand test 4 reps with UE support 06/24/22 30s chair stand 7 reps with UE support   GAIT: Distance walked: 4ft Assistive device utilized: Wheelchair (power) Level of assistance: Complete  Independence Comments: Patient using power chair for mobility 04/22/22 71ft with SBA and B canes      PATIENT EDUCATION:  Education details: Discussed eval findings, rehab rationale and POC and patient is in agreement  Person educated: Patient Education method: Explanation Education comprehension: verbalized understanding and needs further education   HOME EXERCISE PROGRAM: AAccess Code: [email or web address] URL: [email or web address] Date: 04/22/2022 Prepared by: Gustavus Bryant  Exercises - Seated Long Arc Quad  - 3 x daily - 5 x weekly - 2 sets - 10 reps - Seated Hamstring Stretch  - 3 x daily - 5 x weekly - 1 sets - 2 reps - 30s hold - Seated Heel Slide  - 3 x daily - 5 x weekly - 2 sets - 10 reps - 2s hold  [facility] Adult PT Treatment:                                                DATE: 06/24/62  Therapeutic Activity: Gait 46ft with S and B canes A/PROM B knees 30s chair stand test Re-assess progress towards goals  [facility] Adult PT Treatment:                                                DATE: 06/01/22 Therapeutic Exercise: LAQ 10/10 2# Seated ham curls 10x2 B YTB  Seated hamstring stretch 30s x2 B  Supine SLR 2x10 each - small ROM SAQ 2x10 - limited ROM 2# Hooklying knee fallout 15x B, 15/15 unilateral GTB  [facility] Adult PT Treatment:                                                DATE: 05/21/22 Aquatic therapy at [facility]- [address] - therapeutic pool temp 91 degrees in lap pool Pt enters building via W/C and son accompanying. Treatment took place in water 3.8 to 4 ft 8 in.feet deep depending upon activity.  Pt entered and exited the pool via stair and handrails.  Aquatic Exercise: Water walking for warm up forwards, and backwards and side stepping with yellow DB  Walking march with yellow DB x2 laps At edge of pool, pt performed LE exercise Heel raises - 2x20 Hip circles CW/CCW x10 each BIL   Hip abd/add x20 BIL Step ups on submerged step x5 fwd BIL STS  x10 Marching in place x1' On submerged bench LAQ x1' Flutter kicks x 1' Scissor kicks x 1'  Pt requires the buoyancy of water for active assisted  exercises with buoyancy supported for strengthening and AROM exercises. Hydrostatic pressure also supports joints by unweighting joint load by at least 50 % in 3-4 feet depth water. 80% in chest to neck deep water. Water will provide assistance with movement using the current and laminar flow while the buoyancy reduces weight bearing. Pt requires the viscosity of the water for resistance with strengthening exercises.  [facility] Adult PT Treatment:                                                DATE: 05/18/22 Therapeutic Exercise: LAQ 10/10  Seated ham curls 10x2 B YTB  Seated hamstring stretch 30s x2 B  Supine SLR 2x10 each - small ROM SAQ 2x10 - limited ROM Hooklying knee fallout 2x10 GTB Hooklying ball squeeze 2x10 - 5" hold   [facility] Adult PT Treatment:                                                DATE: 05/12/21 Therapeutic Exercise: LAQ 10/10  Supine SLR 2x10 each SAQ 2x10 2# Hooklying knee fallout 2x10 GTB Hooklying ball squeeze 2x10 - 5" hold   ASSESSMENT:   CLINICAL IMPRESSION: Pain levels have decreased following cortisone injections however ROM and strength remain essentially unchanged.  Values fluctuate pending pain levels as well as suspected contractures of B knees which limit TKE.   EVAL - Patient returns to OPPT for continue treatment to B knees due to underlying pain from OA.  She has been offered B TKA once BMI is in acceptable range.  Patient presents with ROM and strength deficits, limited ambulation tolerance due to pain, weakness and loss of endurance.  Patient uses power chair as main means of transportation/mobility.  Sh is able to ambulate household distances with single cane and furniture surfing.  Patient is a good candidate for continued OPPT consisting of land based PT initially to establish a concise HEP following by a  short episode of aquatic PT to establish an independent program using one of the local pool facilities.    OBJECTIVE IMPAIRMENTS: Abnormal gait, decreased activity tolerance, decreased balance, decreased endurance, decreased knowledge of condition, decreased knowledge of use of DME, decreased mobility, difficulty walking, decreased ROM, decreased strength, obesity, and pain.    ACTIVITY LIMITATIONS: carrying, lifting, standing, squatting, stairs, and transfers   PERSONAL FACTORS: Age, Fitness, Past/current experiences, and Time since onset of injury/illness/exacerbation are also affecting patient's functional outcome.    REHAB POTENTIAL: Fair based on chronicity, unsuccessful conservative interventions and advanced OA of B knees   CLINICAL DECISION MAKING: Stable/uncomplicated   EVALUATION COMPLEXITY: Low     GOALS: Goals reviewed with patient? No   SHORT TERM GOALS: Target date: 02/03/2022   Patient to demonstrate independence in HEP  Baseline:[email or web address] Goal status: Ongoing Pt reports non-compliance 02/12/22   2.  Initiate aquatic program Baseline: TBD Goal status: Met      LONG TERM GOALS: Target date: 08/22/2022     Increase BLE strength to 4-/5 in deficit areas Baseline:  MMT Right eval Left eval R 04/22/22 L 04/22/22 R 06/24/22 L  06/24/22  Hip flexion 3 3 3+ 3+ 3+ 3+  Hip extension          Hip  abduction 3 3 3+ 3+ 3+ 3+  Hip adduction          Hip internal rotation          Hip external rotation          Knee flexion 3+ 3+ 3+ 3+ 3+ 3+  Knee extension 3+ 3+ 3+ 3+ 3+ 3+  Ankle dorsiflexion          Ankle plantarflexion 3+ 3+ UTA UTA    Ankle inversion          Ankle eversion            Goal status: INITIAL   2.  Increase FOTO score to 56 Baseline: 21 Goal status: INITIAL   3.  Increase B knee extension AROM to -10d B and flexion to 115d B Baseline:  Active ROM Right eval Left eval R 04/22/22 L  04/22/22 R 06/01/22 L  06/01/22 R 06/24/22 L 06/24/22  Hip  flexion            Hip extension            Hip abduction            Hip adduction            Hip internal rotation            Hip external rotation            Knee flexion 92d 100d 110d 95d 110d 98d 104d 96d  Knee extension -25d -25 -40/-35d -30/-45d -30d -25d -33d -32d   Goal status: INITIAL  4. Increase ambulation distance to 133ft with single cane  Baseline: 62ft with B canes and SBA; 06/01/22 30ft with B canes  Goal Status: Initial  5. Increase 30 second chair stand reps to 6 with UE support  Baseline: 4 with UE support  Goal Status: Initial       PLAN:   PT FREQUENCY: 2x/week   PT DURATION: 6 weeks   PLANNED INTERVENTIONS: Therapeutic exercises, Therapeutic activity, Neuromuscular re-education, Balance training, Gait training, Patient/Family education, Self Care, Joint mobilization, Stair training, DME instructions, and Re-evaluation, aquatic therapy   PLAN FOR NEXT SESSION: HEP review and update, ROM, strengthening, gait training and functional activities  Hildred Laser PT  06/24/22 3:55 PM

## 2022-06-25 ENCOUNTER — Other Ambulatory Visit: Payer: Self-pay

## 2022-06-25 ENCOUNTER — Other Ambulatory Visit (HOSPITAL_COMMUNITY): Payer: Self-pay

## 2022-06-25 ENCOUNTER — Ambulatory Visit: Payer: 59

## 2022-06-25 DIAGNOSIS — M25561 Pain in right knee: Secondary | ICD-10-CM | POA: Diagnosis not present

## 2022-06-25 DIAGNOSIS — G8929 Other chronic pain: Secondary | ICD-10-CM | POA: Diagnosis not present

## 2022-06-25 DIAGNOSIS — M25562 Pain in left knee: Secondary | ICD-10-CM | POA: Diagnosis not present

## 2022-06-25 DIAGNOSIS — M6281 Muscle weakness (generalized): Secondary | ICD-10-CM

## 2022-06-28 ENCOUNTER — Encounter: Payer: Self-pay | Admitting: Family Medicine

## 2022-06-28 ENCOUNTER — Other Ambulatory Visit: Payer: Self-pay

## 2022-06-28 DIAGNOSIS — R52 Pain, unspecified: Secondary | ICD-10-CM

## 2022-06-28 DIAGNOSIS — E1165 Type 2 diabetes mellitus with hyperglycemia: Secondary | ICD-10-CM

## 2022-06-28 DIAGNOSIS — Z6841 Body Mass Index (BMI) 40.0 and over, adult: Secondary | ICD-10-CM

## 2022-06-28 MED ORDER — MOUNJARO 10 MG/0.5ML ~~LOC~~ SOAJ
10.0000 mg | SUBCUTANEOUS | 2 refills | Status: DC
  Filled 2022-06-28: qty 2, 28d supply, fill #0
  Filled 2022-07-30: qty 2, 28d supply, fill #1
  Filled 2022-08-23: qty 2, 28d supply, fill #2

## 2022-06-28 MED ORDER — OXYCODONE HCL 5 MG PO TABS
5.0000 mg | ORAL_TABLET | Freq: Four times a day (QID) | ORAL | 0 refills | Status: DC | PRN
  Filled 2022-06-28: qty 60, 15d supply, fill #0

## 2022-06-29 ENCOUNTER — Other Ambulatory Visit: Payer: Self-pay

## 2022-06-29 ENCOUNTER — Ambulatory Visit: Payer: 59 | Attending: Family Medicine

## 2022-06-29 ENCOUNTER — Other Ambulatory Visit (HOSPITAL_COMMUNITY): Payer: Self-pay

## 2022-06-29 DIAGNOSIS — M25561 Pain in right knee: Secondary | ICD-10-CM | POA: Insufficient documentation

## 2022-06-29 DIAGNOSIS — M6281 Muscle weakness (generalized): Secondary | ICD-10-CM | POA: Insufficient documentation

## 2022-06-29 DIAGNOSIS — G8929 Other chronic pain: Secondary | ICD-10-CM | POA: Insufficient documentation

## 2022-06-29 DIAGNOSIS — M25562 Pain in left knee: Secondary | ICD-10-CM | POA: Insufficient documentation

## 2022-06-30 ENCOUNTER — Ambulatory Visit: Payer: 59 | Admitting: Physical Therapy

## 2022-07-01 ENCOUNTER — Other Ambulatory Visit (HOSPITAL_COMMUNITY): Payer: Self-pay

## 2022-07-01 ENCOUNTER — Telehealth (INDEPENDENT_AMBULATORY_CARE_PROVIDER_SITE_OTHER): Payer: 59 | Admitting: Pharmacist

## 2022-07-01 ENCOUNTER — Other Ambulatory Visit: Payer: Self-pay

## 2022-07-01 DIAGNOSIS — Z794 Long term (current) use of insulin: Secondary | ICD-10-CM

## 2022-07-01 DIAGNOSIS — E1165 Type 2 diabetes mellitus with hyperglycemia: Secondary | ICD-10-CM

## 2022-07-01 MED ORDER — LANTUS SOLOSTAR 100 UNIT/ML ~~LOC~~ SOPN: 50.0000 [IU] | PEN_INJECTOR | Freq: Every day | SUBCUTANEOUS | Status: DC

## 2022-07-01 NOTE — Progress Notes (Addendum)
    S:    Chief Complaint  Patient presents with   Medication Management    DM   57 y.o. female who presents for a virtual visit for diabetes evaluation, education, and management.  Attempted virtual connection however patient was unable to utilize her camera and visit was completed with "Phone" only.   Provider is in their office. Patient is in her car, parked in a parking lot.   PMH is significant for DM, HTN, HLD, and obesity.  Patient was referred and last seen by Primary Care Provider, Dr. Larita Fife, on 06/18/2022.   At last visit, Mounjaro (tirzepatide) was restarted and patient was instructed to hold Jardiance (empagliflozin) due to urinary incontinence.   Today, patient is in good spirits. Reports she is appropriately holding Jardiance as instructed. She says she has not had as much success on Mounjaro (tirzepatide) with glucose control and weight loss as she did Ozempic (semaglutide). Reports BG 200-300s.   Current diabetes medications include: Lantus (insulin glargine) 40 units daily, Mounjaro (tirzepatide) 7.5 mg weekly Current hypertension medications include: amlodipine 5 mg daily, HCTZ 25 mg daily, irbesartan 150 mg daily, furosemide 20 mg daily PRN Current hyperlipidemia medications include: atorvastatin 40 mg daily   Patient reports adherence to taking all medications as prescribed.   Insurance coverage: [employer] Medicare + Medicaid   Patient denies hypoglycemic events.  Reported home fasting blood sugars: 230-250s  Reported 2 hour post-meal/random blood sugars: 300s.  O:   Lab Results  Component Value Date   HGBA1C 6.6 (H) 02/18/2022    Lipid Panel     Component Value Date/Time   CHOL 121 03/11/2021 1704   TRIG 119 03/11/2021 1704   HDL 33 (L) 03/11/2021 1704   CHOLHDL 3.7 03/11/2021 1704   CHOLHDL 4.5 11/27/2015 1408   VLDL 27 11/27/2015 1408   LDLCALC 66 03/11/2021 1704   LDLDIRECT 78 11/24/2020 1702    Clinical Atherosclerotic Cardiovascular Disease  (ASCVD): No  The ASCVD Risk score (Arnett DK, et al., 2019) failed to calculate for the following reasons:   The valid total cholesterol range is 130 to 320 mg/dL   A/P: Diabetes longstanding, currently uncontrolled per patient report. Patient is able to verbalize appropriate hypoglycemia management plan. Medication adherence appears appropriate. Patient's BG will will likely improve once Mounjaro (tirzepatide) is titrated. -Increased dose of basal insulin Lantus (insulin glargine) from 40 units to 50 units daily.  -Continued GLP-1 Mounjaro (tirzepatide) 7.5 mg weekly x2 more weeks; then increase to 10 mg. Prescription for 10 mg already sent to pharmacy at previous visit. -Continue to HOLD SGLT2-I Jardiance (empagliflozin) 10 mg.  Plan to restart. -Patient educated on purpose, proper use, and potential adverse effects of insulin and Mounjaro (tirzepatide).  -Patient currently using Dexcom G6. Instructed patient to see if her phone can download G7 app. If so, will send in prescription for G7. - reconsider at next appointment.  May need to obtain a new G7 Receiver if phone download is not possible due to G6 planned discontinuation.  -Extensively discussed pathophysiology of diabetes, recommended lifestyle interventions, dietary effects on blood sugar control.  -Counseled on s/sx of and management of hypoglycemia.  -Next A1c anticipated July 2024.   Patient verbalized understanding of treatment plan.  Total time counseling 30 minutes.    Follow-up:  PCP clinic visit on 07/16/2022.  Patient seen with Lily Peer, PharmD, PGY-1 resident and Valeda Malm, PharmD, PGY2 Pharmacy Resident.

## 2022-07-01 NOTE — Assessment & Plan Note (Signed)
Diabetes longstanding, currently uncontrolled per patient report. Patient is able to verbalize appropriate hypoglycemia management plan. Medication adherence appears appropriate. Patient's BG will will likely improve once Mounjaro (tirzepatide) is titrated. -Increased dose of basal insulin Lantus (insulin glargine) from 40 units to 50 units daily.  -Continued GLP-1 Mounjaro (tirzepatide) 7.5 mg weekly x2 more weeks; then increase to 10 mg. Prescription for 10 mg already sent to pharmacy at previous visit. -Continue to HOLD SGLT2-I Jardiance (empagliflozin) 10 mg.  Plan to restart. -Patient educated on purpose, proper use, and potential adverse effects of insulin and Mounjaro (tirzepatide).  -Patient currently using Dexcom G6. Instructed patient to see if her phone can download G7 app. If so, will send in prescription for G7. - reconsider at next appointment.  May need to obtain a new G7 Receiver if phone download is not possible due to G6 planned discontinuation.  -Extensively discussed pathophysiology of diabetes, recommended lifestyle interventions, dietary effects on blood sugar control.

## 2022-07-01 NOTE — Patient Instructions (Addendum)
Increase Lantus to 50 units once daily.   Continue with Mounjaro (tirzepatide) 7.5 until current supply is finished.  Plan to increase to 10mg  dose at next fill.   Next appointment with Dr. Larita Fife 6/21

## 2022-07-02 ENCOUNTER — Ambulatory Visit: Payer: 59

## 2022-07-02 DIAGNOSIS — M25562 Pain in left knee: Secondary | ICD-10-CM | POA: Diagnosis not present

## 2022-07-02 DIAGNOSIS — M25561 Pain in right knee: Secondary | ICD-10-CM | POA: Diagnosis not present

## 2022-07-02 DIAGNOSIS — M6281 Muscle weakness (generalized): Secondary | ICD-10-CM | POA: Diagnosis not present

## 2022-07-02 DIAGNOSIS — G8929 Other chronic pain: Secondary | ICD-10-CM | POA: Diagnosis not present

## 2022-07-02 NOTE — Progress Notes (Signed)
Reviewed and agree with Dr Koval's plan.   

## 2022-07-02 NOTE — Therapy (Signed)
OUTPATIENT PHYSICAL THERAPY TREATMENT NOTE  Patient Name: [name] MRN: [id number] DOB:03-Aug-1965, 57 y.o., female Today's Date: 07/02/2022  PCP: Fayette Pho, MD    REFERRING PROVIDER: Otho Darner, MD    PT End of Session - 07/02/22 1603     Visit Number 17    Number of Visits 27    Date for PT Re-Evaluation 08/17/22    Authorization Type [employer]    PT Start Time 1600    PT Stop Time 1645    PT Time Calculation (min) 45 min    Activity Tolerance Patient limited by pain    Behavior During Therapy [facility] for tasks assessed/performed              Past Medical History:  Diagnosis Date   ALLERGIC RHINITIS    takes Zyrtec daily   Anxiety    Arthritis    Asthma    Albuterol prn;Symbicort daily and Singulair daily   Bronchitis    hx of   CAP (community acquired pneumonia) 02/20/2015   Carpal tunnel syndrome    right   Chronic back pain    Constipation    Miralax prn   Depression    takes Cymbalta daily   Diabetes (HCC)    type 2    Eczema    Gallstones 2010   GERD (gastroesophageal reflux disease)    takes Omeprazole daily   Hemorrhoids    Hypertension    takes Prinizide daily and Clonidine on Mondays   Hypoventilation associated with obesity syndrome (HCC)    Insomnia    Irritable bladder    Joint pain    Morbid (severe) obesity due to excess calories (HCC) 04/10/2007   Restrictive changes on pfts 11/30/2007     Myasthenia gravis 1994   Positive Acetylcholine receptor Ab and single fiber EMG Dr. Clarisa [facility]- Dr. 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 CXR improved/stable. PFT wnl. I am happy patient had these tests and we have a baseline for her. At this time, I do not see an indication for starting chronic steroids daily. Patient agrees. Her skin lesions are bothering her the most and syst   Shortness of breath    can be sitting as well as exertion   Skin irritation    skin itchy   Trigger finger    Urinary frequency    takes Ditropan daily   Viral meningitis    history of viral meningitis   Past Surgical History:  Procedure Laterality Date   BRONCHOSCOPY  08/2007   CARPAL TUNNEL RELEASE  05/20/2011   Procedure: CARPAL TUNNEL RELEASE;  Surgeon: [name], MD;  Location: [facility] OR;  Service: Orthopedics;  Laterality: Right;   CARPAL  TUNNEL RELEASE Right 11/22/2019   Procedure: RIGHT HAND CARPAL TUNNEL RELEASE;  Surgeon: Jones Broom, MD;  Location: [facility] ORS;  Service: Orthopedics;  Laterality: Right;   CARPAL TUNNEL RELEASE Left 02/25/2022   Procedure: CARPAL TUNNEL RELEASE;  Surgeon: Jones Broom, MD;  Location: [facility] ORS;  Service: Orthopedics;  Laterality: Left;   CESAREAN SECTION  09/15/2004   COLONOSCOPY N/A 01/21/2014   Procedure: COLONOSCOPY;  Surgeon: Iva Boop, MD;  Location: [facility] ENDOSCOPY;  Service: Endoscopy;  Laterality: N/A;   cortisone injection     receives an injection every 3months   ESOPHAGOGASTRODUODENOSCOPY     ESOPHAGOGASTRODUODENOSCOPY N/A 01/21/2014   Procedure: ESOPHAGOGASTRODUODENOSCOPY (EGD);  Surgeon: Iva Boop, MD;  Location: [facility];  Service: Endoscopy;  Laterality: N/A;   LAPAROSCOPIC CHOLECYSTECTOMY  07/2008   by Dr. Cyndia Bent   Thymus resection  08/25/1993   TRIGGER FINGER RELEASE   05/20/2011   Procedure: RELEASE TRIGGER FINGER/A-1 PULLEY;  Surgeon: [name], MD;  Location: [facility] OR;  Service: Orthopedics;  Laterality: Right;  RIGHT TRIGGER THUMB RELEASE AND RIGHT CARPAL TUNNEL RELEASE   Patient Active Problem List   Diagnosis Date Noted   Weight loss advised 03/07/2022   Drug-induced constipation 12/09/2021   Anxiety 09/23/2021   Chronic right shoulder pain 03/24/2021   Left wrist pain 03/24/2021   Diabetic neuropathy (HCC) 02/25/2021   Healthcare maintenance 10/21/2020   Hyperlipidemia (primary prevention, LDL goal <70) 09/04/2020   Wheelchair dependence 05/05/2020   Carpal tunnel syndrome of right wrist, RECURRENT 10/15/2019   Insomnia, idiopathic 09/30/2018   Grade I diastolic dysfunction 09/30/2018   Encounter for chronic pain management 02/11/2014   Diabetes (HCC) 02/11/2014   Cystocele 09/14/2012   Chronic pain of both knees 05/28/2010   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   Allergic rhinitis 05/30/2008   Carpal tunnel syndrome of left wrist 01/17/2008   OBESITY HYPOVENTILATION SYNDROME 12/27/2007   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   Obstructive sleep apnea 10/30/2007   BMI 50.0-59.9, adult (HCC) 04/10/2007   MYASTHENIA 03/07/2007   Depression, recurrent (HCC) 03/24/2006   Essential (primary) hypertension 03/24/2006   Gastro-esophageal reflux disease without esophagitis 03/24/2006    THERAPY DIAG:  Chronic pain of left knee  Chronic pain of right knee  Muscle weakness (generalized)   Rationale for Evaluation and Treatment Rehabilitation  REFERRING DIAG: BILATERAL KNEE OSTEOARTHRITIS :HIP MUSCLE ; KNEE FLEXION   PERTINENT HISTORY:  HPI Patient is 57 year old female comes in today with bilateral knee pain second opinion.  She has been told that she has arthritis in both knees.  She was seen at [facility] orthopedics recently and told she needed to work on strengthening her legs and they have  actually ordered therapy.  They also discussed with her that she needed to work on weight loss which she is doing.  She states she has had bilateral knee pain for some 30 years.  No known injury.  Radiographs in 2019 are available and show severe tricompartmental arthritis of the right knee.  She does take diclofenac Tylenol Lyrica and oxycodone for her knee pain but states this really helped.  She is also tried injections both cortisone and gel injections without any real relief.  She notes that she has been basically dependent upon a motorized wheelchair for the last 2 years.  She does ambulate short distances though.  She states prior to the motorized wheelchair she was walking with a walker.  She is lost from 430 pounds down to  316 pounds.  States due to her myasthenia gravis she has been on prednisone.  Has gained a lot of weight over the years.  She has also had weakness in the extremities for years.  Reports that her diabetes is under good control at this point in time.  PRECAUTIONS/RESTRICTIONS:   FALL  SUBJECTIVE: Patient reports continued BIL knee pain, R worse than L today.   PAIN:  Are you having pain? Yes: NPRS scale: 7/10 (R>L) Pain location: B knees Pain description: ache Aggravating factors: everything Relieving factors: nothing  OBJECTIVE: (objective measures completed at initial evaluation unless otherwise dated)  DIAGNOSTIC FINDINGS: CLINICAL DATA:  57 year old female with a history of chronic right knee pain   EXAM: RIGHT KNEE 3 VIEWS   COMPARISON:  08/03/2013   FINDINGS: No acute displaced fracture. Advanced joint space narrowing of the mediolateral compartment with marginal osteophyte formation, sclerotic changes. Degenerative changes of the patellofemoral joint. Changes have not progressed since the comparison of 08/03/2013   IMPRESSION: Advanced tricompartmental osteoarthritis, worse than the comparison study of 2015.   No acute bony abnormality  identified.     Electronically Signed   By: Gilmer Mor D.O.   On: 11/09/2017 09:34     PATIENT SURVEYS:  FOTO 21(56 predicted)    COGNITION: Overall cognitive status: Within functional limits for tasks assessed                         SENSATION: Not tested   MUSCLE LENGTH: UTA due to pain levels   POSTURE:  UTA due to poor standing tolerance   PALPATION: deferred   LOWER EXTREMITY ROM:   Active ROM Right eval Left eval R 04/22/22 L  04/22/22 R 06/01/22 L  06/01/22 R 06/24/22 L 06/24/22  Hip flexion            Hip extension            Hip abduction            Hip adduction            Hip internal rotation            Hip external rotation            Knee flexion 92d 100d 110d 95d 110d 98d 104d 96d  Knee extension -25d -25 -40/-35d -30/-45d -30d -25d -33d -32d  Ankle dorsiflexion            Ankle plantarflexion            Ankle inversion            Ankle eversion             (Blank rows = not tested)   LOWER EXTREMITY MMT:   MMT Right eval Left eval R 04/22/22 L 04/22/22 R 06/24/22 L  06/24/22  Hip flexion 3 3 3+ 3+ 3+ 3+  Hip extension          Hip abduction 3 3 3+ 3+ 3+ 3+  Hip adduction          Hip internal rotation          Hip external rotation          Knee flexion 3+ 3+ 3+ 3+ 3+ 3+  Knee extension 3+ 3+ 3+ 3+ 3+ 3+  Ankle dorsiflexion          Ankle plantarflexion 3+ 3+ UTA UTA    Ankle inversion  Ankle eversion           PF strength testing deferred due to B achilles pain.   LOWER EXTREMITY SPECIAL TESTS:  UTA due to pain and limited tolerance to testing positions    FUNCTIONAL TESTS:  30 seconds chair stand test 0 reps 04/22/22 30s chair stand test 4 reps with UE support 06/24/22 30s chair stand 7 reps with UE support   GAIT: Distance walked: 55ft Assistive device utilized: Wheelchair (power) Level of assistance: Complete Independence Comments: Patient using power chair for mobility 04/22/22 89ft with SBA and B canes       PATIENT EDUCATION:  Education details: Discussed eval findings, rehab rationale and POC and patient is in agreement  Person educated: Patient Education method: Explanation Education comprehension: verbalized understanding and needs further education   HOME EXERCISE PROGRAM: AAccess Code: [email or web address] URL: [email or web address] Date: 04/22/2022 Prepared by: Gustavus Bryant  Exercises - Seated Long Arc Quad  - 3 x daily - 5 x weekly - 2 sets - 10 reps - Seated Hamstring Stretch  - 3 x daily - 5 x weekly - 1 sets - 2 reps - 30s hold - Seated Heel Slide  - 3 x daily - 5 x weekly - 2 sets - 10 reps - 2s hold  [facility] Adult PT Treatment:                                                DATE: 07/02/22 Aquatic therapy at [facility]- [address] - therapeutic pool temp 91 degrees in lap pool Pt enters building via W/C and son accompanying. Treatment took place in water 3.8 to 4 ft 8 in.feet deep depending upon activity.  Pt entered and exited the pool via stair and handrails.  Aquatic Exercise: Water walking for warm up and cool down - forwards/backwards/side stepping with blue DB  Sidestepping with shoulder abd/add blue DB x 2 laps Walking march with blue DB x2 laps Trunk rotation and straight leg kick with blue DB x20 BIL Water jogging in place with blue DB x5' At edge of pool, pt performed LE exercise Heel raises - 2x20 Hip extension x20 BIL Mini squats 2x20 Hip circles CW/CCW x10 each BIL Hip flexion/extension knee straight x20 BIL   Hip abd/add x20 BIL   Pt requires the buoyancy of water for active assisted exercises with buoyancy supported for strengthening and AROM exercises. Hydrostatic pressure also supports joints by unweighting joint load by at least 50 % in 3-4 feet depth water. 80% in chest to neck deep water. Water will provide assistance with movement using the current and laminar flow while the buoyancy reduces weight bearing. Pt requires the viscosity of the  water for resistance with strengthening exercises.   [facility] Adult PT Treatment:                                                DATE: 06/25/22 Aquatic therapy at [facility]- [address] - therapeutic pool temp 91 degrees in lap pool Pt enters building via W/C and son accompanying. Treatment took place in water 3.8 to 4 ft 8 in.feet deep depending upon activity.  Pt entered and exited the pool via stair and handrails.  Aquatic Exercise: Water  walking for warm up and cool down - forwards/backwards/side stepping with blue DB  Sidestepping with shoulder abd/add blue DB x 2 laps Walking march with blue DB x2 laps Back against wall with blue DB: shoulder flexion/ext, abd/add, horizontal abd/add Trunk rotation and straight leg kick with blue DB x20 BIL Water jogging on place with blue DB x5' At edge of pool, pt performed LE exercise Heel raises - 2x20 Hip extension x20 BIL Mini squats x20 Hip circles CW/CCW x10 each BIL Hip flexion/extension knee straight x20 BIL   Hip abd/add x20 BIL   Pt requires the buoyancy of water for active assisted exercises with buoyancy supported for strengthening and AROM exercises. Hydrostatic pressure also supports joints by unweighting joint load by at least 50 % in 3-4 feet depth water. 80% in chest to neck deep water. Water will provide assistance with movement using the current and laminar flow while the buoyancy reduces weight bearing. Pt requires the viscosity of the water for resistance with strengthening exercises.   [facility] Adult PT Treatment:                                                DATE: 06/24/62  Therapeutic Activity: Gait 97ft with S and B canes A/PROM B knees 30s chair stand test Re-assess progress towards goals     ASSESSMENT:   CLINICAL IMPRESSION:  Patient presents to aquatic PT reporting continued BIL knee pain, especially when attempting to walk, R>L. Session today focused on BIL LE strengthening and general conditioning in the  aquatic environment for use of buoyancy to offload joints and the viscosity of water as resistance during therapeutic exercise. Patient was able to tolerate all prescribed exercises in the aquatic environment. Patient continues to benefit from skilled PT services on land and aquatic based and should be progressed as able to improve functional independence.   EVAL - Patient returns to OPPT for continue treatment to B knees due to underlying pain from OA.  She has been offered B TKA once BMI is in acceptable range.  Patient presents with ROM and strength deficits, limited ambulation tolerance due to pain, weakness and loss of endurance.  Patient uses power chair as main means of transportation/mobility.  Sh is able to ambulate household distances with single cane and furniture surfing.  Patient is a good candidate for continued OPPT consisting of land based PT initially to establish a concise HEP following by a short episode of aquatic PT to establish an independent program using one of the local pool facilities.    OBJECTIVE IMPAIRMENTS: Abnormal gait, decreased activity tolerance, decreased balance, decreased endurance, decreased knowledge of condition, decreased knowledge of use of DME, decreased mobility, difficulty walking, decreased ROM, decreased strength, obesity, and pain.    ACTIVITY LIMITATIONS: carrying, lifting, standing, squatting, stairs, and transfers   PERSONAL FACTORS: Age, Fitness, Past/current experiences, and Time since onset of injury/illness/exacerbation are also affecting patient's functional outcome.    REHAB POTENTIAL: Fair based on chronicity, unsuccessful conservative interventions and advanced OA of B knees   CLINICAL DECISION MAKING: Stable/uncomplicated   EVALUATION COMPLEXITY: Low     GOALS: Goals reviewed with patient? No   SHORT TERM GOALS: Target date: 02/03/2022   Patient to demonstrate independence in HEP  Baseline:[email or web address] Goal status: Ongoing Pt reports  non-compliance 02/12/22   2.  Initiate aquatic program Baseline: TBD Goal  status: Met      LONG TERM GOALS: Target date: 08/22/2022     Increase BLE strength to 4-/5 in deficit areas Baseline:  MMT Right eval Left eval R 04/22/22 L 04/22/22 R 06/24/22 L  06/24/22  Hip flexion 3 3 3+ 3+ 3+ 3+  Hip extension          Hip abduction 3 3 3+ 3+ 3+ 3+  Hip adduction          Hip internal rotation          Hip external rotation          Knee flexion 3+ 3+ 3+ 3+ 3+ 3+  Knee extension 3+ 3+ 3+ 3+ 3+ 3+  Ankle dorsiflexion          Ankle plantarflexion 3+ 3+ UTA UTA    Ankle inversion          Ankle eversion            Goal status: INITIAL   2.  Increase FOTO score to 56 Baseline: 21 Goal status: INITIAL   3.  Increase B knee extension AROM to -10d B and flexion to 115d B Baseline:  Active ROM Right eval Left eval R 04/22/22 L  04/22/22 R 06/01/22 L  06/01/22 R 06/24/22 L 06/24/22  Hip flexion            Hip extension            Hip abduction            Hip adduction            Hip internal rotation            Hip external rotation            Knee flexion 92d 100d 110d 95d 110d 98d 104d 96d  Knee extension -25d -25 -40/-35d -30/-45d -30d -25d -33d -32d   Goal status: INITIAL  4. Increase ambulation distance to 158ft with single cane  Baseline: 16ft with B canes and SBA; 06/01/22 53ft with B canes  Goal Status: Initial  5. Increase 30 second chair stand reps to 6 with UE support  Baseline: 4 with UE support  Goal Status: Initial       PLAN:   PT FREQUENCY: 2x/week   PT DURATION: 6 weeks   PLANNED INTERVENTIONS: Therapeutic exercises, Therapeutic activity, Neuromuscular re-education, Balance training, Gait training, Patient/Family education, Self Care, Joint mobilization, Stair training, DME instructions, and Re-evaluation, aquatic therapy   PLAN FOR NEXT SESSION: HEP review and update, ROM, strengthening, gait training and functional activities  Berta Minor  PTA  07/02/22 4:41 PM

## 2022-07-06 ENCOUNTER — Ambulatory Visit: Payer: 59

## 2022-07-07 ENCOUNTER — Other Ambulatory Visit (HOSPITAL_COMMUNITY): Payer: Self-pay

## 2022-07-09 ENCOUNTER — Ambulatory Visit: Payer: 59

## 2022-07-14 ENCOUNTER — Ambulatory Visit: Payer: 59

## 2022-07-15 ENCOUNTER — Other Ambulatory Visit (HOSPITAL_COMMUNITY): Payer: Self-pay

## 2022-07-15 ENCOUNTER — Other Ambulatory Visit: Payer: Self-pay

## 2022-07-16 ENCOUNTER — Ambulatory Visit: Payer: 59

## 2022-07-16 ENCOUNTER — Ambulatory Visit: Payer: 59 | Admitting: Family Medicine

## 2022-07-16 DIAGNOSIS — M25562 Pain in left knee: Secondary | ICD-10-CM | POA: Diagnosis not present

## 2022-07-16 DIAGNOSIS — M25561 Pain in right knee: Secondary | ICD-10-CM | POA: Diagnosis not present

## 2022-07-16 DIAGNOSIS — M6281 Muscle weakness (generalized): Secondary | ICD-10-CM | POA: Diagnosis not present

## 2022-07-16 DIAGNOSIS — G8929 Other chronic pain: Secondary | ICD-10-CM | POA: Diagnosis not present

## 2022-07-16 NOTE — Therapy (Signed)
OUTPATIENT PHYSICAL THERAPY TREATMENT NOTE  Patient Name: [name]  MRN: [id number] DOB:April 29, 1965, 57 y.o., female Today's Date: 07/16/2022  PCP: Fayette Pho, MD    REFERRING PROVIDER: Otho Darner, MD    PT End of Session - 07/16/22 1525     Visit Number 18    Number of Visits 27    Date for PT Re-Evaluation 08/17/22    Authorization Type [employer]    PT Start Time 1525    PT Stop Time 1610    PT Time Calculation (min) 45 min    Activity Tolerance Patient limited by pain    Behavior During Therapy [facility] for tasks assessed/performed               Past Medical History:  Diagnosis Date   ALLERGIC RHINITIS    takes Zyrtec daily   Anxiety    Arthritis    Asthma    Albuterol prn;Symbicort daily and Singulair daily   Bronchitis    hx of   CAP (community acquired pneumonia) 02/20/2015   Carpal tunnel syndrome    right   Chronic back pain    Constipation    Miralax prn   Depression    takes Cymbalta daily   Diabetes (HCC)    type 2    Eczema    Gallstones 2010   GERD (gastroesophageal reflux disease)    takes Omeprazole daily   Hemorrhoids    Hypertension    takes Prinizide daily and Clonidine on Mondays   Hypoventilation associated with obesity syndrome (HCC)    Insomnia    Irritable bladder    Joint pain    Morbid (severe) obesity due to excess calories (HCC) 04/10/2007   Restrictive changes on pfts 11/30/2007     Myasthenia gravis 1994   Positive Acetylcholine receptor Ab and single fiber EMG Dr. Clarisa [facility]- Dr. 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  Coralyn Helling- 10/30/2007 ANA positive, ANA titer  negative, RF less than 20   Rheumatoid arthritis(714.0)    Sarcoidosis 12/23/2010   Derm  dx NCG  12/03/10 (path report in EPIC)    Overview:  Overview:  Diagnosed on skin biopsy November 2012  Last Assessment & Plan:  Labs are unremarkable. CXR improved/stable. PFT wnl. I am happy patient had these tests and we have a baseline for her. At this time, I do not see an indication for starting chronic steroids daily. Patient agrees. Her skin lesions are bothering her the most and syst   Shortness of breath    can be sitting as well as exertion   Skin irritation    skin itchy   Trigger finger    Urinary frequency    takes Ditropan daily   Viral meningitis    history of viral meningitis   Past Surgical History:  Procedure Laterality Date   BRONCHOSCOPY  08/2007   CARPAL TUNNEL RELEASE  05/20/2011   Procedure: CARPAL TUNNEL RELEASE;  Surgeon: [name], MD;  Location: [facility] OR;  Service: Orthopedics;  Laterality: Right;  CARPAL TUNNEL RELEASE Right 11/22/2019   Procedure: RIGHT HAND CARPAL TUNNEL RELEASE;  Surgeon: Jones Broom, MD;  Location: [facility] ORS;  Service: Orthopedics;  Laterality: Right;   CARPAL TUNNEL RELEASE Left 02/25/2022   Procedure: CARPAL TUNNEL RELEASE;  Surgeon: Jones Broom, MD;  Location: [facility] ORS;  Service: Orthopedics;  Laterality: Left;   CESAREAN SECTION  09/15/2004   COLONOSCOPY N/A 01/21/2014   Procedure: COLONOSCOPY;  Surgeon: Iva Boop, MD;  Location: [facility] ENDOSCOPY;  Service: Endoscopy;  Laterality: N/A;   cortisone injection     receives an injection every 3months   ESOPHAGOGASTRODUODENOSCOPY     ESOPHAGOGASTRODUODENOSCOPY N/A 01/21/2014   Procedure: ESOPHAGOGASTRODUODENOSCOPY (EGD);  Surgeon: Iva Boop, MD;  Location: [facility];  Service: Endoscopy;  Laterality: N/A;   LAPAROSCOPIC CHOLECYSTECTOMY  07/2008   by Dr. Cyndia Bent   Thymus resection  08/25/1993   TRIGGER FINGER RELEASE   05/20/2011   Procedure: RELEASE TRIGGER FINGER/A-1 PULLEY;  Surgeon: [name], MD;  Location: [facility] OR;  Service: Orthopedics;  Laterality: Right;  RIGHT TRIGGER THUMB RELEASE AND RIGHT CARPAL TUNNEL RELEASE   Patient Active Problem List   Diagnosis Date Noted   Weight loss advised 03/07/2022   Drug-induced constipation 12/09/2021   Anxiety 09/23/2021   Chronic right shoulder pain 03/24/2021   Left wrist pain 03/24/2021   Diabetic neuropathy (HCC) 02/25/2021   Healthcare maintenance 10/21/2020   Hyperlipidemia (primary prevention, LDL goal <70) 09/04/2020   Wheelchair dependence 05/05/2020   Carpal tunnel syndrome of right wrist, RECURRENT 10/15/2019   Insomnia, idiopathic 09/30/2018   Grade I diastolic dysfunction 09/30/2018   Encounter for chronic pain management 02/11/2014   Diabetes (HCC) 02/11/2014   Cystocele 09/14/2012   Chronic pain of both knees 05/28/2010   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   Allergic rhinitis 05/30/2008   Carpal tunnel syndrome of left wrist 01/17/2008   OBESITY HYPOVENTILATION SYNDROME 12/27/2007   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   Obstructive sleep apnea 10/30/2007   BMI 50.0-59.9, adult (HCC) 04/10/2007   MYASTHENIA 03/07/2007   Depression, recurrent (HCC) 03/24/2006   Essential (primary) hypertension 03/24/2006   Gastro-esophageal reflux disease without esophagitis 03/24/2006    THERAPY DIAG:  Chronic pain of left knee  Chronic pain of right knee  Muscle weakness (generalized)   Rationale for Evaluation and Treatment Rehabilitation  REFERRING DIAG: BILATERAL KNEE OSTEOARTHRITIS :HIP MUSCLE ; KNEE FLEXION   PERTINENT HISTORY:  HPI Patient is 57 year old female comes in today with bilateral knee pain second opinion.  She has been told that she has arthritis in both knees.  She was seen at [facility] orthopedics recently and told she needed to work on strengthening her legs and they have  actually ordered therapy.  They also discussed with her that she needed to work on weight loss which she is doing.  She states she has had bilateral knee pain for some 30 years.  No known injury.  Radiographs in 2019 are available and show severe tricompartmental arthritis of the right knee.  She does take diclofenac Tylenol Lyrica and oxycodone for her knee pain but states this really helped.  She is also tried injections both cortisone and gel injections without any real relief.  She notes that she has been basically dependent upon a motorized wheelchair for the last 2 years.  She does ambulate short distances though.  She states prior to the motorized wheelchair she was walking with a walker.  She is lost from 430 pounds down  to 316 pounds.  States due to her myasthenia gravis she has been on prednisone.  Has gained a lot of weight over the years.  She has also had weakness in the extremities for years.  Reports that her diabetes is under good control at this point in time.  PRECAUTIONS/RESTRICTIONS:   FALL  SUBJECTIVE: Patient reports that she has had several issues in her personal life recently which has lead to her missing appointments and her pain has been increased.   PAIN:  Are you having pain? Yes: NPRS scale: 7/10 (R>L) Pain location: B knees Pain description: ache Aggravating factors: everything Relieving factors: nothing  OBJECTIVE: (objective measures completed at initial evaluation unless otherwise dated)  DIAGNOSTIC FINDINGS: CLINICAL DATA:  57 year old female with a history of chronic right knee pain   EXAM: RIGHT KNEE 3 VIEWS   COMPARISON:  08/03/2013   FINDINGS: No acute displaced fracture. Advanced joint space narrowing of the mediolateral compartment with marginal osteophyte formation, sclerotic changes. Degenerative changes of the patellofemoral joint. Changes have not progressed since the comparison of 08/03/2013   IMPRESSION: Advanced tricompartmental  osteoarthritis, worse than the comparison study of 2015.   No acute bony abnormality identified.     Electronically Signed   By: Gilmer Mor D.O.   On: 11/09/2017 09:34     PATIENT SURVEYS:  FOTO 21(56 predicted)    COGNITION: Overall cognitive status: Within functional limits for tasks assessed                         SENSATION: Not tested   MUSCLE LENGTH: UTA due to pain levels   POSTURE:  UTA due to poor standing tolerance   PALPATION: deferred   LOWER EXTREMITY ROM:   Active ROM Right eval Left eval R 04/22/22 L  04/22/22 R 06/01/22 L  06/01/22 R 06/24/22 L 06/24/22  Hip flexion            Hip extension            Hip abduction            Hip adduction            Hip internal rotation            Hip external rotation            Knee flexion 92d 100d 110d 95d 110d 98d 104d 96d  Knee extension -25d -25 -40/-35d -30/-45d -30d -25d -33d -32d  Ankle dorsiflexion            Ankle plantarflexion            Ankle inversion            Ankle eversion             (Blank rows = not tested)   LOWER EXTREMITY MMT:   MMT Right eval Left eval R 04/22/22 L 04/22/22 R 06/24/22 L  06/24/22  Hip flexion 3 3 3+ 3+ 3+ 3+  Hip extension          Hip abduction 3 3 3+ 3+ 3+ 3+  Hip adduction          Hip internal rotation          Hip external rotation          Knee flexion 3+ 3+ 3+ 3+ 3+ 3+  Knee extension 3+ 3+ 3+ 3+ 3+ 3+  Ankle dorsiflexion          Ankle  plantarflexion 3+ 3+ UTA UTA    Ankle inversion          Ankle eversion           PF strength testing deferred due to B achilles pain.   LOWER EXTREMITY SPECIAL TESTS:  UTA due to pain and limited tolerance to testing positions    FUNCTIONAL TESTS:  30 seconds chair stand test 0 reps 04/22/22 30s chair stand test 4 reps with UE support 06/24/22 30s chair stand 7 reps with UE support   GAIT: Distance walked: 93ft Assistive device utilized: Wheelchair (power) Level of assistance: Complete  Independence Comments: Patient using power chair for mobility 04/22/22 32ft with SBA and B canes      PATIENT EDUCATION:  Education details: Discussed eval findings, rehab rationale and POC and patient is in agreement  Person educated: Patient Education method: Explanation Education comprehension: verbalized understanding and needs further education   HOME EXERCISE PROGRAM: AAccess Code: [email or web address] URL: [email or web address] Date: 04/22/2022 Prepared by: Gustavus Bryant  Exercises - Seated Long Arc Quad  - 3 x daily - 5 x weekly - 2 sets - 10 reps - Seated Hamstring Stretch  - 3 x daily - 5 x weekly - 1 sets - 2 reps - 30s hold - Seated Heel Slide  - 3 x daily - 5 x weekly - 2 sets - 10 reps - 2s hold  [facility] Adult PT Treatment:                                                DATE: 07/16/22 Aquatic therapy at [facility] [address] - therapeutic pool temp 91 degrees in lap pool Pt enters building via W/C and son accompanying. Treatment took place in water 3.8 to 4 ft 8 in.feet deep depending upon activity.  Pt entered and exited the pool via stair and handrails.  Aquatic Exercise: Water walking for warm up and cool down - forwards/backwards/side stepping with blue DB  Sidestepping with shoulder abd/add blue DB x 2 laps Walking march with blue DB x2 laps Trunk rotation and straight leg kick with blue DB x20 BIL Water jogging in place with blue DB x5' Step ups on submerged step x10 fwd/lat BIL (mild pain in Rt hip after lateral) At edge of pool, pt performed LE exercise Heel/toe raises - 2x20 Hip extension x20 BIL Hip circles CW/CCW x10 each BIL Hip flexion/extension knee straight x20 BIL   Hip abd/add x20 BIL   Pt requires the buoyancy of water for active assisted exercises with buoyancy supported for strengthening and AROM exercises. Hydrostatic pressure also supports joints by unweighting joint load by at least 50 % in 3-4 feet depth water. 80% in chest to  neck deep water. Water will provide assistance with movement using the current and laminar flow while the buoyancy reduces weight bearing. Pt requires the viscosity of the water for resistance with strengthening exercises.   [facility] Adult PT Treatment:                                                DATE: 07/02/22 Aquatic therapy at [facility] [address] - therapeutic pool temp 91 degrees in lap pool Pt enters building via W/C and son  accompanying. Treatment took place in water 3.8 to 4 ft 8 in.feet deep depending upon activity.  Pt entered and exited the pool via stair and handrails.  Aquatic Exercise: Water walking for warm up and cool down - forwards/backwards/side stepping with blue DB  Sidestepping with shoulder abd/add blue DB x 2 laps Walking march with blue DB x2 laps Trunk rotation and straight leg kick with blue DB x20 BIL Water jogging in place with blue DB x5' At edge of pool, pt performed LE exercise Heel raises - 2x20 Hip extension x20 BIL Mini squats 2x20 Hip circles CW/CCW x10 each BIL Hip flexion/extension knee straight x20 BIL   Hip abd/add x20 BIL   Pt requires the buoyancy of water for active assisted exercises with buoyancy supported for strengthening and AROM exercises. Hydrostatic pressure also supports joints by unweighting joint load by at least 50 % in 3-4 feet depth water. 80% in chest to neck deep water. Water will provide assistance with movement using the current and laminar flow while the buoyancy reduces weight bearing. Pt requires the viscosity of the water for resistance with strengthening exercises.   [facility] Adult PT Treatment:                                                DATE: 06/25/22 Aquatic therapy at [facility] [address] - therapeutic pool temp 91 degrees in lap pool Pt enters building via W/C and son accompanying. Treatment took place in water 3.8 to 4 ft 8 in.feet deep depending upon activity.  Pt entered and exited the pool via stair  and handrails.  Aquatic Exercise: Water walking for warm up and cool down - forwards/backwards/side stepping with blue DB  Sidestepping with shoulder abd/add blue DB x 2 laps Walking march with blue DB x2 laps Back against wall with blue DB: shoulder flexion/ext, abd/add, horizontal abd/add Trunk rotation and straight leg kick with blue DB x20 BIL Water jogging on place with blue DB x5' At edge of pool, pt performed LE exercise Heel raises - 2x20 Hip extension x20 BIL Mini squats x20 Hip circles CW/CCW x10 each BIL Hip flexion/extension knee straight x20 BIL   Hip abd/add x20 BIL   Pt requires the buoyancy of water for active assisted exercises with buoyancy supported for strengthening and AROM exercises. Hydrostatic pressure also supports joints by unweighting joint load by at least 50 % in 3-4 feet depth water. 80% in chest to neck deep water. Water will provide assistance with movement using the current and laminar flow while the buoyancy reduces weight bearing. Pt requires the viscosity of the water for resistance with strengthening exercises.    ASSESSMENT:   CLINICAL IMPRESSION:  Patient presents to aquatic PT session reporting continued BIL knee pain and recent life stressors which has made adherence to HEP and appointments more difficult. Session today focused on BIL LE strengthening and consistent movement for full session in the aquatic environment for use of buoyancy to offload joints and the viscosity of water as resistance during therapeutic exercise. Patient was able to tolerate all prescribed exercises in the aquatic environment with no adverse effects. Patient continues to benefit from skilled PT services on land and aquatic based and should be progressed as able to improve functional independence.    EVAL - Patient returns to OPPT for continue treatment to B knees due to  underlying pain from OA.  She has been offered B TKA once BMI is in acceptable range.  Patient  presents with ROM and strength deficits, limited ambulation tolerance due to pain, weakness and loss of endurance.  Patient uses power chair as main means of transportation/mobility.  Sh is able to ambulate household distances with single cane and furniture surfing.  Patient is a good candidate for continued OPPT consisting of land based PT initially to establish a concise HEP following by a short episode of aquatic PT to establish an independent program using one of the local pool facilities.    OBJECTIVE IMPAIRMENTS: Abnormal gait, decreased activity tolerance, decreased balance, decreased endurance, decreased knowledge of condition, decreased knowledge of use of DME, decreased mobility, difficulty walking, decreased ROM, decreased strength, obesity, and pain.    ACTIVITY LIMITATIONS: carrying, lifting, standing, squatting, stairs, and transfers   PERSONAL FACTORS: Age, Fitness, Past/current experiences, and Time since onset of injury/illness/exacerbation are also affecting patient's functional outcome.    REHAB POTENTIAL: Fair based on chronicity, unsuccessful conservative interventions and advanced OA of B knees   CLINICAL DECISION MAKING: Stable/uncomplicated   EVALUATION COMPLEXITY: Low     GOALS: Goals reviewed with patient? No   SHORT TERM GOALS: Target date: 02/03/2022   Patient to demonstrate independence in HEP  Baseline:[email or web address] Goal status: Ongoing Pt reports non-compliance 02/12/22   2.  Initiate aquatic program Baseline: TBD Goal status: Met      LONG TERM GOALS: Target date: 08/22/2022     Increase BLE strength to 4-/5 in deficit areas Baseline:  MMT Right eval Left eval R 04/22/22 L 04/22/22 R 06/24/22 L  06/24/22  Hip flexion 3 3 3+ 3+ 3+ 3+  Hip extension          Hip abduction 3 3 3+ 3+ 3+ 3+  Hip adduction          Hip internal rotation          Hip external rotation          Knee flexion 3+ 3+ 3+ 3+ 3+ 3+  Knee extension 3+ 3+ 3+ 3+ 3+ 3+  Ankle  dorsiflexion          Ankle plantarflexion 3+ 3+ UTA UTA    Ankle inversion          Ankle eversion            Goal status: INITIAL   2.  Increase FOTO score to 56 Baseline: 21 Goal status: INITIAL   3.  Increase B knee extension AROM to -10d B and flexion to 115d B Baseline:  Active ROM Right eval Left eval R 04/22/22 L  04/22/22 R 06/01/22 L  06/01/22 R 06/24/22 L 06/24/22  Hip flexion            Hip extension            Hip abduction            Hip adduction            Hip internal rotation            Hip external rotation            Knee flexion 92d 100d 110d 95d 110d 98d 104d 96d  Knee extension -25d -25 -40/-35d -30/-45d -30d -25d -33d -32d   Goal status: INITIAL  4. Increase ambulation distance to 149ft with single cane  Baseline: 47ft with B canes and SBA; 06/01/22 72ft with B canes  Goal Status:  Initial  5. Increase 30 second chair stand reps to 6 with UE support  Baseline: 4 with UE support  Goal Status: Initial       PLAN:   PT FREQUENCY: 2x/week   PT DURATION: 6 weeks   PLANNED INTERVENTIONS: Therapeutic exercises, Therapeutic activity, Neuromuscular re-education, Balance training, Gait training, Patient/Family education, Self Care, Joint mobilization, Stair training, DME instructions, and Re-evaluation, aquatic therapy   PLAN FOR NEXT SESSION: HEP review and update, ROM, strengthening, gait training and functional activities  Berta Minor PTA  07/16/22 4:06 PM

## 2022-07-20 ENCOUNTER — Ambulatory Visit: Payer: 59 | Admitting: Family Medicine

## 2022-07-22 NOTE — Addendum Note (Signed)
Addended by: Hildred Laser on: 07/22/2022 02:56 PM   Modules accepted: Orders

## 2022-07-30 ENCOUNTER — Other Ambulatory Visit: Payer: Self-pay | Admitting: Family Medicine

## 2022-07-30 ENCOUNTER — Other Ambulatory Visit: Payer: Self-pay

## 2022-07-30 DIAGNOSIS — J45909 Unspecified asthma, uncomplicated: Secondary | ICD-10-CM

## 2022-07-30 MED ORDER — BUDESONIDE-FORMOTEROL FUMARATE 160-4.5 MCG/ACT IN AERO
2.0000 | INHALATION_SPRAY | Freq: Two times a day (BID) | RESPIRATORY_TRACT | 8 refills | Status: DC
  Filled 2022-07-30: qty 10.2, 30d supply, fill #0
  Filled 2022-08-23: qty 10.2, 30d supply, fill #1
  Filled 2022-10-05: qty 10.2, 30d supply, fill #2
  Filled 2022-10-31: qty 10.2, 30d supply, fill #3
  Filled 2022-12-18: qty 10.2, 30d supply, fill #4
  Filled 2023-01-27: qty 10.2, 30d supply, fill #5
  Filled 2023-03-14: qty 10.2, 30d supply, fill #6
  Filled 2023-04-27: qty 10.2, 30d supply, fill #7
  Filled 2023-06-28: qty 10.2, 30d supply, fill #8

## 2022-08-02 ENCOUNTER — Other Ambulatory Visit: Payer: Self-pay | Admitting: Family Medicine

## 2022-08-02 DIAGNOSIS — R52 Pain, unspecified: Secondary | ICD-10-CM

## 2022-08-02 MED ORDER — DEXCOM G6 SENSOR MISC
11 refills | Status: DC
  Filled 2022-08-02: qty 3, 30d supply, fill #0
  Filled 2022-08-23: qty 3, 30d supply, fill #1

## 2022-08-02 MED ORDER — OXYCODONE HCL 5 MG PO TABS
5.0000 mg | ORAL_TABLET | Freq: Four times a day (QID) | ORAL | 0 refills | Status: DC | PRN
  Filled 2022-08-02: qty 60, 15d supply, fill #0

## 2022-08-03 ENCOUNTER — Other Ambulatory Visit: Payer: Self-pay

## 2022-08-03 ENCOUNTER — Other Ambulatory Visit (HOSPITAL_COMMUNITY): Payer: Self-pay

## 2022-08-04 DIAGNOSIS — M170 Bilateral primary osteoarthritis of knee: Secondary | ICD-10-CM | POA: Diagnosis not present

## 2022-08-04 DIAGNOSIS — M25552 Pain in left hip: Secondary | ICD-10-CM | POA: Diagnosis not present

## 2022-08-04 DIAGNOSIS — M25551 Pain in right hip: Secondary | ICD-10-CM | POA: Diagnosis not present

## 2022-08-09 ENCOUNTER — Other Ambulatory Visit (HOSPITAL_COMMUNITY): Payer: Self-pay

## 2022-08-11 DIAGNOSIS — G5602 Carpal tunnel syndrome, left upper limb: Secondary | ICD-10-CM | POA: Diagnosis not present

## 2022-08-18 NOTE — Therapy (Addendum)
OUTPATIENT PHYSICAL THERAPY TREATMENT NOTE/PROGRESS NOTE/RECERT  Patient Name: [name]  MRN: [id number] DOB:December 06, 1965, 57 y.o., female Today's Date: 08/20/2022  PCP: Fayette Pho, MD    REFERRING PROVIDER: Otho Darner, MD    PT End of Session - 08/19/22 1707     Visit Number 19    Number of Visits 27    Date for PT Re-Evaluation 08/17/22    Authorization Type [employer]    PT Start Time 1705    PT Stop Time 1745    PT Time Calculation (min) 40 min    Activity Tolerance Patient limited by pain    Behavior During Therapy [facility] for tasks assessed/performed                Past Medical History:  Diagnosis Date   ALLERGIC RHINITIS    takes Zyrtec daily   Anxiety    Arthritis    Asthma    Albuterol prn;Symbicort daily and Singulair daily   Bronchitis    hx of   CAP (community acquired pneumonia) 02/20/2015   Carpal tunnel syndrome    right   Chronic back pain    Constipation    Miralax prn   Depression    takes Cymbalta daily   Diabetes (HCC)    type 2    Eczema    Gallstones 2010   GERD (gastroesophageal reflux disease)    takes Omeprazole daily   Hemorrhoids    Hypertension    takes Prinizide daily and Clonidine on Mondays   Hypoventilation associated with obesity syndrome (HCC)    Insomnia    Irritable bladder    Joint pain    Morbid (severe) obesity due to excess calories (HCC) 04/10/2007   Restrictive changes on pfts 11/30/2007     Myasthenia gravis 1994   Positive Acetylcholine receptor Ab and single fiber EMG Dr. Clar[facility]- Dr. 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 Coralyn Helling- 10/30/2007 ANA positive, ANA titer  negative, RF less than 20   Rheumatoid arthritis(714.0)    Sarcoidosis 12/23/2010   Derm  dx NCG  12/03/10 (path report in EPIC)    Overview:  Overview:  Diagnosed on skin biopsy November 2012  Last Assessment & Plan:  Labs are unremarkable. CXR improved/stable. PFT wnl. I am happy patient had these tests and we have a baseline for her. At this time, I do not see an indication for starting chronic steroids daily. Patient agrees. Her skin lesions are bothering her the most and syst   Shortness of breath    can be sitting as well as exertion   Skin irritation    skin itchy   Trigger finger    Urinary frequency    takes Ditropan daily   Viral meningitis    history of viral meningitis   Past Surgical History:  Procedure Laterality Date   BRONCHOSCOPY  08/2007   CARPAL TUNNEL RELEASE  05/20/2011   Procedure: CARPAL TUNNEL RELEASE;  Surgeon: [name], MD;  Location: [facility] OR;  Service: Orthopedics;  Laterality:  Right;   CARPAL TUNNEL RELEASE Right 11/22/2019   Procedure: RIGHT HAND CARPAL TUNNEL RELEASE;  Surgeon: Jones Broom, MD;  Location: [facility] ORS;  Service: Orthopedics;  Laterality: Right;   CARPAL TUNNEL RELEASE Left 02/25/2022   Procedure: CARPAL TUNNEL RELEASE;  Surgeon: Jones Broom, MD;  Location: [facility] ORS;  Service: Orthopedics;  Laterality: Left;   CESAREAN SECTION  09/15/2004   COLONOSCOPY N/A 01/21/2014   Procedure: COLONOSCOPY;  Surgeon: Iva Boop, MD;  Location: [facility] ENDOSCOPY;  Service: Endoscopy;  Laterality: N/A;   cortisone injection     receives an injection every 3months   ESOPHAGOGASTRODUODENOSCOPY     ESOPHAGOGASTRODUODENOSCOPY N/A 01/21/2014   Procedure: ESOPHAGOGASTRODUODENOSCOPY (EGD);  Surgeon: Iva Boop, MD;  Location: [facility];  Service: Endoscopy;  Laterality: N/A;   LAPAROSCOPIC CHOLECYSTECTOMY  07/2008   by Dr. Cyndia Bent   Thymus resection  08/25/1993    TRIGGER FINGER RELEASE  05/20/2011   Procedure: RELEASE TRIGGER FINGER/A-1 PULLEY;  Surgeon: [name], MD;  Location: [facility] OR;  Service: Orthopedics;  Laterality: Right;  RIGHT TRIGGER THUMB RELEASE AND RIGHT CARPAL TUNNEL RELEASE   Patient Active Problem List   Diagnosis Date Noted   Weight loss advised 03/07/2022   Drug-induced constipation 12/09/2021   Anxiety 09/23/2021   Chronic right shoulder pain 03/24/2021   Left wrist pain 03/24/2021   Diabetic neuropathy (HCC) 02/25/2021   Healthcare maintenance 10/21/2020   Hyperlipidemia (primary prevention, LDL goal <70) 09/04/2020   Wheelchair dependence 05/05/2020   Carpal tunnel syndrome of right wrist, RECURRENT 10/15/2019   Insomnia, idiopathic 09/30/2018   Grade I diastolic dysfunction 09/30/2018   Encounter for chronic pain management 02/11/2014   Diabetes (HCC) 02/11/2014   Cystocele 09/14/2012   Chronic pain of both knees 05/28/2010   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   Allergic rhinitis 05/30/2008   Carpal tunnel syndrome of left wrist 01/17/2008   OBESITY HYPOVENTILATION SYNDROME 12/27/2007   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  She has been told that she has arthritis in both knees.  She was seen at [facility] orthopedics recently and told she needed to work on strengthening her legs and they have actually ordered therapy.  They also discussed with her  that she needed to work on weight loss which she is doing.  She states she has had bilateral knee pain for some 30 years.  No known injury.  Radiographs in 2019 are available and show severe tricompartmental arthritis of the right knee.  She does take diclofenac Tylenol Lyrica and oxycodone for her knee pain but states this really helped.  She is also tried injections both cortisone and gel injections without any real relief.  She notes that she has been basically dependent upon a motorized wheelchair for the last 2 years.  She does ambulate short distances though.  She states prior to the motorized wheelchair she was walking with a walker.  She is lost from 430 pounds down to 316 pounds.  States due to her  myasthenia gravis she has been on prednisone.  Has gained a lot of weight over the years.  She has also had weakness in the extremities for years.  Reports that her diabetes is under good control at this point in time.  PRECAUTIONS/RESTRICTIONS:   FALL  SUBJECTIVE: Patient returns to OPPT for continued treatment of ongoing B knee pain, weakness and stiffness.  Has not been compliant with HEP and feels aquatic therapy is the only exercise she can tolerate.   PAIN:  Are you having pain? Yes: NPRS scale: 7/10 (R>L) Pain location: B knees Pain description: ache Aggravating factors: everything Relieving factors: nothing  OBJECTIVE: (objective measures completed at initial evaluation unless otherwise dated)  DIAGNOSTIC FINDINGS: CLINICAL DATA:  57 year old female with a history of chronic right knee pain   EXAM: RIGHT KNEE 3 VIEWS   COMPARISON:  08/03/2013   FINDINGS: No acute displaced fracture. Advanced joint space narrowing of the mediolateral compartment with marginal osteophyte formation, sclerotic changes. Degenerative changes of the patellofemoral joint. Changes have not progressed since the comparison of 08/03/2013   IMPRESSION: Advanced tricompartmental osteoarthritis, worse  than the comparison study of 2015.   No acute bony abnormality identified.     Electronically Signed   By: Gilmer Mor D.O.   On: 11/09/2017 09:34     PATIENT SURVEYS:  FOTO 21(56 predicted)    COGNITION: Overall cognitive status: Within functional limits for tasks assessed                         SENSATION: Not tested   MUSCLE LENGTH: UTA due to pain levels   POSTURE:  Flexed knee/hip posture   PALPATION: deferred   LOWER EXTREMITY ROM:   Active ROM Right eval Left eval R 04/22/22 L  04/22/22 R 06/01/22 L  06/01/22 R 06/24/22 L 06/24/22 R 08/19/22 L 08/19/22  Hip flexion              Hip extension              Hip abduction              Hip adduction              Hip internal rotation              Hip external rotation              Knee flexion 92d 100d 110d 95d 110d 98d 104d 96d 98d 100d  Knee extension -25d -25 -40/-35d -30/-45d -30d -25d -33d -32d -38 -32  Ankle dorsiflexion              Ankle plantarflexion              Ankle inversion              Ankle eversion               (Blank rows = not tested)   LOWER EXTREMITY MMT:   MMT Right eval Left eval R 04/22/22 L 04/22/22 R 06/24/22 L  06/24/22 R 08/19/22 L 08/19/22  Hip flexion 3 3 3+ 3+ 3+ 3+ 3 3  Hip extension            Hip abduction 3 3 3+ 3+ 3+ 3+    Hip adduction            Hip internal rotation            Hip external rotation  Knee flexion 3+ 3+ 3+ 3+ 3+ 3+ 3 3  Knee extension 3+ 3+ 3+ 3+ 3+ 3+ 3 3  Ankle dorsiflexion            Ankle plantarflexion 3+ 3+ UTA UTA      Ankle inversion            Ankle eversion             PF strength testing deferred due to B achilles pain.   LOWER EXTREMITY SPECIAL TESTS:  UTA due to pain and limited tolerance to testing positions    FUNCTIONAL TESTS:  30 seconds chair stand test 0 reps 04/22/22 30s chair stand test 4 reps with UE support 06/24/22 30s chair stand 7 reps with UE support 08/19/22 30s chair stand 7 reps with UE support    GAIT: Distance walked: 40ft Assistive device utilized: Wheelchair (power) Level of assistance: Complete Independence Comments: Patient using power chair for mobility 04/22/22 67ft with SBA and B canes 08/20/22 62ft with B canes 2 MWT      PATIENT EDUCATION:  Education details: Discussed eval findings, rehab rationale and POC and patient is in agreement  Person educated: Patient Education method: Explanation Education comprehension: verbalized understanding and needs further education   HOME EXERCISE PROGRAM: AAccess Code: [email or web address] URL: [email or web address] Date: 04/22/2022 Prepared by: Gustavus Bryant  Exercises - Seated Long Arc Quad  - 3 x daily - 5 x weekly - 2 sets - 10 reps - Seated Hamstring Stretch  - 3 x daily - 5 x weekly - 1 sets - 2 reps - 30s hold - Seated Heel Slide  - 3 x daily - 5 x weekly - 2 sets - 10 reps - 2s hold  [facility] Adult PT Treatment:                                                DATE: 08/19/22 Therapeutic Exercise: [facility] Adult PT Treatment:                                                DATE: 07/16/22 Aquatic therapy at [facility] [address] - therapeutic pool temp 91 degrees in lap pool Pt enters building via W/C and son accompanying. Treatment took place in water 3.8 to 4 ft 8 in.feet deep depending upon activity.  Pt entered and exited the pool via stair and handrails.  Aquatic Exercise: Water walking for warm up and cool down - forwards/backwards/side stepping with blue DB  Sidestepping with shoulder abd/add blue DB x 2 laps Walking march with blue DB x2 laps Trunk rotation and straight leg kick with blue DB x20 BIL Water jogging in place with blue DB x5' Step ups on submerged step x10 fwd/lat BIL (mild pain in Rt hip after lateral) At edge of pool, pt performed LE exercise Heel/toe raises - 2x20 Hip extension x20 BIL Hip circles CW/CCW x10 each BIL Hip flexion/extension knee straight x20 BIL   Hip abd/add  x20 BIL   Pt requires the buoyancy of water for active assisted exercises with buoyancy supported for strengthening and AROM exercises. Hydrostatic pressure also supports joints by unweighting joint load by at least 50 % in 3-4 feet depth water.  80% in chest to neck deep water. Water will provide assistance with movement using the current and laminar flow while the buoyancy reduces weight bearing. Pt requires the viscosity of the water for resistance with strengthening exercises.   [facility] Adult PT Treatment:                                                DATE: 07/02/22 Aquatic therapy at [facility] [address] - therapeutic pool temp 91 degrees in lap pool Pt enters building via W/C and son accompanying. Treatment took place in water 3.8 to 4 ft 8 in.feet deep depending upon activity.  Pt entered and exited the pool via stair and handrails.  Aquatic Exercise: Water walking for warm up and cool down - forwards/backwards/side stepping with blue DB  Sidestepping with shoulder abd/add blue DB x 2 laps Walking march with blue DB x2 laps Trunk rotation and straight leg kick with blue DB x20 BIL Water jogging in place with blue DB x5' At edge of pool, pt performed LE exercise Heel raises - 2x20 Hip extension x20 BIL Mini squats 2x20 Hip circles CW/CCW x10 each BIL Hip flexion/extension knee straight x20 BIL   Hip abd/add x20 BIL   Pt requires the buoyancy of water for active assisted exercises with buoyancy supported for strengthening and AROM exercises. Hydrostatic pressure also supports joints by unweighting joint load by at least 50 % in 3-4 feet depth water. 80% in chest to neck deep water. Water will provide assistance with movement using the current and laminar flow while the buoyancy reduces weight bearing. Pt requires the viscosity of the water for resistance with strengthening exercises.   [facility] Adult PT Treatment:                                                DATE: 06/25/22 Aquatic  therapy at [facility] [address] - therapeutic pool temp 91 degrees in lap pool Pt enters building via W/C and son accompanying. Treatment took place in water 3.8 to 4 ft 8 in.feet deep depending upon activity.  Pt entered and exited the pool via stair and handrails.  Aquatic Exercise: Water walking for warm up and cool down - forwards/backwards/side stepping with blue DB  Sidestepping with shoulder abd/add blue DB x 2 laps Walking march with blue DB x2 laps Back against wall with blue DB: shoulder flexion/ext, abd/add, horizontal abd/add Trunk rotation and straight leg kick with blue DB x20 BIL Water jogging on place with blue DB x5' At edge of pool, pt performed LE exercise Heel raises - 2x20 Hip extension x20 BIL Mini squats x20 Hip circles CW/CCW x10 each BIL Hip flexion/extension knee straight x20 BIL   Hip abd/add x20 BIL   Pt requires the buoyancy of water for active assisted exercises with buoyancy supported for strengthening and AROM exercises. Hydrostatic pressure also supports joints by unweighting joint load by at least 50 % in 3-4 feet depth water. 80% in chest to neck deep water. Water will provide assistance with movement using the current and laminar flow while the buoyancy reduces weight bearing. Pt requires the viscosity of the water for resistance with strengthening exercises.    ASSESSMENT:   CLINICAL IMPRESSION: Patient  returns to OPPT with updated Rx to address ongoing B knee pain, weakness and contractures.  A/PROM essentially unchangd since start of PT episode in 12/2021.  Chart review finds ROM and strength have declined referencing PT notes from 09/2018.  At his time patient has not maintained compliance with her HEP, recommendation to pursue self paced aquatic therapy or adhere to scheduled OPPT sessions.  Recommend continue aquatic based OPPT to reinforce HEP, provide handout for aquatic therapy facilities for her to continue independent management then DC  to referring MD to discuss additional treatment options which may include static/dynamic bracing options.   EVAL - Patient returns to OPPT for continue treatment to B knees due to underlying pain from OA.  She has been offered B TKA once BMI is in acceptable range.  Patient presents with ROM and strength deficits, limited ambulation tolerance due to pain, weakness and loss of endurance.  Patient uses power chair as main means of transportation/mobility.  Sh is able to ambulate household distances with single cane and furniture surfing.  Patient is a good candidate for continued OPPT consisting of land based PT initially to establish a concise HEP following by a short episode of aquatic PT to establish an independent program using one of the local pool facilities.    OBJECTIVE IMPAIRMENTS: Abnormal gait, decreased activity tolerance, decreased balance, decreased endurance, decreased knowledge of condition, decreased knowledge of use of DME, decreased mobility, difficulty walking, decreased ROM, decreased strength, obesity, and pain.    ACTIVITY LIMITATIONS: carrying, lifting, standing, squatting, stairs, and transfers   PERSONAL FACTORS: Age, Fitness, Past/current experiences, and Time since onset of injury/illness/exacerbation are also affecting patient's functional outcome.    REHAB POTENTIAL: Fair based on chronicity, unsuccessful conservative interventions and advanced OA of B knees   CLINICAL DECISION MAKING: Stable/uncomplicated   EVALUATION COMPLEXITY: Low     GOALS: Goals reviewed with patient? No   SHORT TERM GOALS: Target date: 02/03/2022   Patient to demonstrate independence in HEP  Baseline:[email or web address] Goal status: Ongoing Pt reports non-compliance 08/19/22   2.  Initiate aquatic program Baseline: TBD; 08/19/22 Established Goal status: Met      LONG TERM GOALS: Target date: 09/22/2022     Increase BLE strength to 4-/5 in deficit areas Baseline:  MMT Right eval Left eval  R 04/22/22 L 04/22/22 R 06/24/22 L  06/24/22 R 08/19/22 L 08/19/22  Hip flexion 3 3 3+ 3+ 3+ 3+ 3 3  Hip extension            Hip abduction 3 3 3+ 3+ 3+ 3+    Hip adduction            Hip internal rotation            Hip external rotation            Knee flexion 3+ 3+ 3+ 3+ 3+ 3+ 3 3  Knee extension 3+ 3+ 3+ 3+ 3+ 3+ 3 3  Ankle dorsiflexion            Ankle plantarflexion 3+ 3+ UTA UTA      Ankle inversion            Ankle eversion              Goal status: INITIAL   2.  Increase FOTO score to 56 Baseline: 21 Goal status: INITIAL   3.  Increase B knee extension AROM to -10d B and flexion to 115d B Baseline:  Active ROM Right eval Left  eval R 04/22/22 L  04/22/22 R 06/01/22 L  06/01/22 R 06/24/22 L 06/24/22 R 08/19/22 L 08/19/22  Hip flexion              Hip extension              Hip abduction              Hip adduction              Hip internal rotation              Hip external rotation              Knee flexion 92d 100d 110d 95d 110d 98d 104d 96d 98d 100d  Knee extension -25d -25 -40/-35d -30/-45d -30d -25d -33d -32d -38 -32  Ankle dorsiflexion              Ankle plantarflexion              Ankle inversion              Ankle eversion               (Blank rows = not tested)  Goal status: Ongoing  4. Increase ambulation distance to 136ft with single cane  Baseline: 54ft with B canes and SBA; 06/01/22 9ft with B canes; 08/19/22 25ft with B canes  Goal Status: Ongoing  5. Increase 30 second chair stand reps to 6 with UE support  Baseline: 4 with UE support; 08/19/22 7 stands  Goal Status: Met       PLAN:   PT FREQUENCY: 1x/week   PT DURATION: 4 weeks   PLANNED INTERVENTIONS: Therapeutic exercises, Therapeutic activity, Neuromuscular re-education, Balance training, Gait training, Patient/Family education, Self Care, Joint mobilization, Stair training, DME instructions, and Re-evaluation, aquatic therapy   PLAN FOR NEXT SESSION: HEP review and update, ROM,  strengthening, gait training and functional activities  Hildred Laser PT  08/20/22 9:01 AM

## 2022-08-19 ENCOUNTER — Ambulatory Visit: Payer: 59 | Attending: Family Medicine

## 2022-08-19 DIAGNOSIS — M25562 Pain in left knee: Secondary | ICD-10-CM | POA: Insufficient documentation

## 2022-08-19 DIAGNOSIS — G8929 Other chronic pain: Secondary | ICD-10-CM | POA: Diagnosis not present

## 2022-08-19 DIAGNOSIS — M6281 Muscle weakness (generalized): Secondary | ICD-10-CM | POA: Insufficient documentation

## 2022-08-19 DIAGNOSIS — M25561 Pain in right knee: Secondary | ICD-10-CM | POA: Insufficient documentation

## 2022-08-23 ENCOUNTER — Other Ambulatory Visit (HOSPITAL_COMMUNITY): Payer: Self-pay

## 2022-08-23 ENCOUNTER — Other Ambulatory Visit: Payer: Self-pay | Admitting: Family Medicine

## 2022-08-23 ENCOUNTER — Other Ambulatory Visit: Payer: Self-pay

## 2022-08-23 DIAGNOSIS — R52 Pain, unspecified: Secondary | ICD-10-CM

## 2022-08-23 MED ORDER — OXYCODONE HCL 5 MG PO TABS: 5.0000 mg | ORAL_TABLET | Freq: Four times a day (QID) | ORAL | 0 refills | Status: DC | PRN

## 2022-08-24 ENCOUNTER — Other Ambulatory Visit (HOSPITAL_COMMUNITY): Payer: Self-pay

## 2022-08-24 ENCOUNTER — Ambulatory Visit (INDEPENDENT_AMBULATORY_CARE_PROVIDER_SITE_OTHER): Payer: 59 | Admitting: Family Medicine

## 2022-08-24 ENCOUNTER — Other Ambulatory Visit: Payer: Self-pay

## 2022-08-24 ENCOUNTER — Encounter: Payer: Self-pay | Admitting: Family Medicine

## 2022-08-24 VITALS — BP 135/67 | HR 102 | Ht 63.0 in | Wt 341.6 lb

## 2022-08-24 DIAGNOSIS — Z6841 Body Mass Index (BMI) 40.0 and over, adult: Secondary | ICD-10-CM | POA: Diagnosis not present

## 2022-08-24 DIAGNOSIS — M25512 Pain in left shoulder: Secondary | ICD-10-CM | POA: Diagnosis not present

## 2022-08-24 DIAGNOSIS — Z794 Long term (current) use of insulin: Secondary | ICD-10-CM

## 2022-08-24 DIAGNOSIS — G8929 Other chronic pain: Secondary | ICD-10-CM

## 2022-08-24 DIAGNOSIS — E1165 Type 2 diabetes mellitus with hyperglycemia: Secondary | ICD-10-CM

## 2022-08-24 DIAGNOSIS — M25511 Pain in right shoulder: Secondary | ICD-10-CM | POA: Diagnosis not present

## 2022-08-24 LAB — POCT GLYCOSYLATED HEMOGLOBIN (HGB A1C): HbA1c, POC (controlled diabetic range): 7.2 % — AB (ref 0.0–7.0)

## 2022-08-24 MED ORDER — MOUNJARO 10 MG/0.5ML ~~LOC~~ SOAJ
10.0000 mg | SUBCUTANEOUS | 2 refills | Status: DC
  Filled 2022-08-26: qty 2, 28d supply, fill #0
  Filled 2022-10-05: qty 2, 28d supply, fill #1

## 2022-08-24 MED ORDER — CYCLOSPORINE 0.05 % OP EMUL
1.0000 [drp] | Freq: Two times a day (BID) | OPHTHALMIC | 3 refills | Status: DC
  Filled 2022-08-24: qty 5.5, 25d supply, fill #0
  Filled 2022-10-05: qty 5.5, 25d supply, fill #1
  Filled 2022-10-31: qty 5.5, 25d supply, fill #2
  Filled 2022-12-18: qty 5.5, 25d supply, fill #3

## 2022-08-24 MED ORDER — DEXCOM G7 RECEIVER DEVI
0 refills | Status: AC
Start: 2022-08-24 — End: ?
  Filled 2022-08-24: qty 1, 30d supply, fill #0

## 2022-08-24 MED ORDER — LANTUS SOLOSTAR 100 UNIT/ML ~~LOC~~ SOPN
30.0000 [IU] | PEN_INJECTOR | Freq: Every day | SUBCUTANEOUS | Status: DC
Start: 2022-08-24 — End: 2023-04-27

## 2022-08-24 MED ORDER — DEXCOM G7 SENSOR MISC
11 refills | Status: DC
Start: 2022-08-24 — End: 2023-09-23
  Filled 2022-08-24 – 2022-09-02 (×2): qty 3, 30d supply, fill #0
  Filled 2022-10-05: qty 3, 30d supply, fill #1
  Filled 2022-10-31: qty 3, 30d supply, fill #2
  Filled 2022-12-18: qty 3, 30d supply, fill #3
  Filled 2023-01-27: qty 3, 30d supply, fill #4
  Filled 2023-03-14: qty 3, 30d supply, fill #5
  Filled 2023-04-07: qty 3, 30d supply, fill #6
  Filled 2023-05-20 – 2023-05-21 (×2): qty 3, 30d supply, fill #7
  Filled 2023-06-28: qty 3, 30d supply, fill #8
  Filled 2023-08-10: qty 3, 30d supply, fill #9

## 2022-08-24 NOTE — Progress Notes (Signed)
SUBJECTIVE:   CHIEF COMPLAINT / HPI:   T2DM -Current medication regimen: Lantus 30 units daily, Mounjaro 7.5 mg weekly.  On irbesartan, Lipitor, pregabalin, and Jardiance as well. -Last A1c: 6.6 on 02/18/22. Today's A1c: 7.2 -Home CBGs: Fasting usually runs 114-130s. Had one low sugar in the 60s after not eating for a while. Post prandial usually under 200. -Denies polyuria, polydipsia, abdominal pain, chest pain, shortness of breath  Bilateral shoulder pain x6 months -Sharp pains that occur with movement with some underlying aching -Reports that she has gotten a steroid injection in the right shoulder before but this was a long time ago -No surgeries in her shoulders -No recent injuries -Her left shoulder is worse.  She often has to support her left arm with her right arm in order to move -Takes chronic pain medications including oxycodone and diclofenac with minimal relief -She has done physical therapy for her knees but not her shoulders  PERTINENT  PMH / PSH:   Past Medical History:  Diagnosis Date   ALLERGIC RHINITIS    takes Zyrtec daily   Anxiety    Arthritis    Asthma    Albuterol prn;Symbicort daily and Singulair daily   Bronchitis    hx of   CAP (community acquired pneumonia) 02/20/2015   Carpal tunnel syndrome    right   Chronic back pain    Constipation    Miralax prn   Depression    takes Cymbalta daily   Diabetes (HCC)    type 2    Eczema    Gallstones 2010   GERD (gastroesophageal reflux disease)    takes Omeprazole daily   Hemorrhoids    Hypertension    takes Prinizide daily and Clonidine on Mondays   Hypoventilation associated with obesity syndrome (HCC)    Insomnia    Irritable bladder    Joint pain    Morbid (severe) obesity due to excess calories (HCC) 04/10/2007   Restrictive changes on pfts 11/30/2007     Myasthenia gravis 1994   Positive Acetylcholine receptor Ab and single fiber EMG Dr. [facility]- Dr. 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 Coralyn Helling- 10/30/2007 ANA positive, ANA titer  negative, RF less than 20   Rheumatoid arthritis(714.0)    Sarcoidosis 12/23/2010   Derm  dx NCG  12/03/10 (path report in EPIC)    Overview:  Overview:  Diagnosed on skin biopsy November 2012  Last Assessment & Plan:  Labs are unremarkable. CXR improved/stable. PFT wnl. I am happy patient had these tests and we have a baseline for her. At this time, I do not see an indication for starting chronic steroids daily. Patient agrees. Her skin lesions are bothering her the most and syst   Shortness of breath    can be sitting as well as exertion   Skin irritation    skin itchy   Trigger finger    Urinary frequency    takes Ditropan daily   Viral meningitis    history of viral meningitis    OBJECTIVE:  BP 135/67   Pulse (!) 102   Ht 5\' 3"  (1.6 m)   Wt (!) 341 lb 9.6 oz (154.9 kg)   SpO2 100%   BMI 60.51 kg/m   General: NAD, pleasant, able to participate in exam, in wheelchair Cardiac: RRR, no murmurs auscultated Respiratory: CTAB, normal WOB Abdomen: soft, non-tender, non-distended, normoactive bowel sounds Skin: warm and dry, no rashes noted Neuro: alert, no obvious focal deficits, speech normal Psych: Normal affect and mood  MSK bilateral shoulders: Inspection: No gross deformity, ecchymosis, swelling Palpation: Tender to palpation over [facility] joint on left shoulder.  Otherwise nontender to palpation along bilateral clavicles, right AC joint, scapula, deltoids ROM: Shoulder abduction limited to about 100 degrees bilaterally due to pain.  Shoulder  flexion to about 170 degrees.  Passive and active external rotation of bilateral shoulders to about 80 degrees. Strength: 5/5 strength Special tests: Positive Hawkins, empty can, Neer's on left shoulder.  Negative on right  ASSESSMENT/PLAN:   Type 2 diabetes mellitus with hyperglycemia, with long-term current use of insulin (HCC) Assessment & Plan: A1c 7.2 from 6.6, will increase Mounjaro to 10 weekly and keep her Lantus at 30 units daily since her home CBGs are within normal range.  Continue Jardiance, Lipitor, pregabalin, irbesartan.  Follow-up in 3 months  Orders: -     POCT glycosylated hemoglobin (Hb A1C) -     Dexcom G7 Sensor; Apply 1 sensor every 10 days  Dispense: 3 each; Refill: 11 -     Dexcom G7 Receiver; Use as directed  Dispense: 1 each; Refill: 0 -     Lantus SoloStar; Inject 30 Units into the skin daily. -     Mounjaro; Inject 10 mg into the skin once a week.  Dispense: 2 mL; Refill: 2  Chronic pain of both shoulders Assessment & Plan: Left greater than right.  Positive impingement testing on left suspicious for rotator cuff syndrome, would likely benefit from physical therapy as well as sports medicine evaluation for possible injection and or ultrasound/imaging.  Otherwise continue current pain medications  Orders: -     Ambulatory referral to Sports Medicine -     Ambulatory referral to Physical Therapy  BMI 50.0-59.9, adult (HCC) -     Mounjaro; Inject 10 mg into the skin once a week.  Dispense: 2 mL; Refill: 2   Meds ordered this encounter  Medications   Continuous Glucose Sensor (DEXCOM G7 SENSOR) MISC    Sig: Apply 1 sensor every 10 days    Dispense:  3 each    Refill:  11   Continuous Glucose Receiver (DEXCOM G7 RECEIVER) DEVI    Sig: Use as directed    Dispense:  1 each    Refill:  0   insulin glargine (LANTUS SOLOSTAR) 100 UNIT/ML Solostar Pen    Sig: Inject 30 Units into the skin daily.   tirzepatide [facility]) 10 MG/0.5ML Pen    Sig: Inject 10 mg  into the skin once a week.    Dispense:  2 mL    Refill:  2   Return in about 3 months (around 11/24/2022).  Vonna Drafts, MD [facility]

## 2022-08-24 NOTE — Assessment & Plan Note (Signed)
Left greater than right.  Positive impingement testing on left suspicious for rotator cuff syndrome, would likely benefit from physical therapy as well as sports medicine evaluation for possible injection and or ultrasound/imaging.  Otherwise continue current pain medications

## 2022-08-24 NOTE — Patient Instructions (Addendum)
We have ordered your new Dexcom sensor and are working on getting this to you  Please continue taking insulin 30 units daily  I have increased her Mounjaro to 10 mg weekly  You'll get a call about sports medicine and physical therapy soon  Follow-up in 3 months

## 2022-08-24 NOTE — Assessment & Plan Note (Signed)
A1c 7.2 from 6.6, will increase Mounjaro to 10 weekly and keep her Lantus at 30 units daily since her home CBGs are within normal range.  Continue Jardiance, Lipitor, pregabalin, irbesartan.  Follow-up in 3 months

## 2022-08-25 ENCOUNTER — Other Ambulatory Visit: Payer: Self-pay

## 2022-08-25 ENCOUNTER — Other Ambulatory Visit (HOSPITAL_COMMUNITY): Payer: Self-pay

## 2022-08-25 ENCOUNTER — Other Ambulatory Visit: Payer: Self-pay | Admitting: Family Medicine

## 2022-08-25 DIAGNOSIS — M174 Other bilateral secondary osteoarthritis of knee: Secondary | ICD-10-CM

## 2022-08-25 DIAGNOSIS — E0810 Diabetes mellitus due to underlying condition with ketoacidosis without coma: Secondary | ICD-10-CM

## 2022-08-25 DIAGNOSIS — I10 Essential (primary) hypertension: Secondary | ICD-10-CM

## 2022-08-25 DIAGNOSIS — E1165 Type 2 diabetes mellitus with hyperglycemia: Secondary | ICD-10-CM

## 2022-08-25 DIAGNOSIS — G8929 Other chronic pain: Secondary | ICD-10-CM

## 2022-08-25 NOTE — Therapy (Signed)
OUTPATIENT PHYSICAL THERAPY TREATMENT NOTE  Patient Name: [name]  MRN: [id number] DOB:Apr 10, 1965, 57 y.o., female Today's Date: 08/27/2022  PCP: Fayette Pho, MD    REFERRING PROVIDER: Otho Darner, MD    PT End of Session - 08/26/22 1549     Visit Number 20    Number of Visits 27    Date for PT Re-Evaluation 09/22/22    Authorization Type [employer]    PT Start Time 1550    PT Stop Time 1628    PT Time Calculation (min) 38 min    Activity Tolerance Patient limited by pain    Behavior During Therapy [facility] for tasks assessed/performed                 Past Medical History:  Diagnosis Date   ALLERGIC RHINITIS    takes Zyrtec daily   Anxiety    Arthritis    Asthma    Albuterol prn;Symbicort daily and Singulair daily   Bronchitis    hx of   CAP (community acquired pneumonia) 02/20/2015   Carpal tunnel syndrome    right   Chronic back pain    Constipation    Miralax prn   Depression    takes Cymbalta daily   Diabetes (HCC)    type 2    Eczema    Gallstones 2010   GERD (gastroesophageal reflux disease)    takes Omeprazole daily   Hemorrhoids    Hypertension    takes Prinizide daily and Clonidine on Mondays   Hypoventilation associated with obesity syndrome (HCC)    Insomnia    Irritable bladder    Joint pain    Morbid (severe) obesity due to excess calories (HCC) 04/10/2007   Restrictive changes on pfts 11/30/2007     Myasthenia gravis 1994   Positive Acetylcholine receptor Ab and single fiber EMG Dr. Clarisa [facility]- Dr. 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  Coralyn Helling- 10/30/2007 ANA positive, ANA titer  negative, RF less than 20   Rheumatoid arthritis(714.0)    Sarcoidosis 12/23/2010   Derm  dx NCG  12/03/10 (path report in EPIC)    Overview:  Overview:  Diagnosed on skin biopsy November 2012  Last Assessment & Plan:  Labs are unremarkable. CXR improved/stable. PFT wnl. I am happy patient had these tests and we have a baseline for her. At this time, I do not see an indication for starting chronic steroids daily. Patient agrees. Her skin lesions are bothering her the most and syst   Shortness of breath    can be sitting as well as exertion   Skin irritation    skin itchy   Trigger finger    Urinary frequency    takes Ditropan daily   Viral meningitis    history of viral meningitis   Past Surgical History:  Procedure Laterality Date   BRONCHOSCOPY  08/2007   CARPAL TUNNEL RELEASE  05/20/2011   Procedure: CARPAL TUNNEL RELEASE;  Surgeon: [name], MD;  Location: [facility] OR;  Service: Orthopedics;  Laterality:  Right;   CARPAL TUNNEL RELEASE Right 11/22/2019   Procedure: RIGHT HAND CARPAL TUNNEL RELEASE;  Surgeon: Jones Broom, MD;  Location: [facility];  Service: Orthopedics;  Laterality: Right;   CARPAL TUNNEL RELEASE Left 02/25/2022   Procedure: CARPAL TUNNEL RELEASE;  Surgeon: Jones Broom, MD;  Location: [facility];  Service: Orthopedics;  Laterality: Left;   CESAREAN SECTION  09/15/2004   COLONOSCOPY N/A 01/21/2014   Procedure: COLONOSCOPY;  Surgeon: Iva Boop, MD;  Location: [facility];  Service: Endoscopy;  Laterality: N/A;   cortisone injection     receives an injection every 3months   ESOPHAGOGASTRODUODENOSCOPY     ESOPHAGOGASTRODUODENOSCOPY N/A 01/21/2014   Procedure: ESOPHAGOGASTRODUODENOSCOPY (EGD);  Surgeon: Iva Boop, MD;  Location: [facility];  Service: Endoscopy;  Laterality: N/A;   LAPAROSCOPIC CHOLECYSTECTOMY  07/2008   by Dr. Cyndia Bent   Thymus resection  08/25/1993   TRIGGER FINGER RELEASE   05/20/2011   Procedure: RELEASE TRIGGER FINGER/A-1 PULLEY;  Surgeon: [name], MD;  Location: [facility];  Service: Orthopedics;  Laterality: Right;  RIGHT TRIGGER THUMB RELEASE AND RIGHT CARPAL TUNNEL RELEASE   Patient Active Problem List   Diagnosis Date Noted   Weight loss advised 03/07/2022   Drug-induced constipation 12/09/2021   Anxiety 09/23/2021   Chronic pain of both shoulders 03/24/2021   Left wrist pain 03/24/2021   Diabetic neuropathy (HCC) 02/25/2021   Healthcare maintenance 10/21/2020   Hyperlipidemia (primary prevention, LDL goal <70) 09/04/2020   Wheelchair dependence 05/05/2020   Carpal tunnel syndrome of right wrist, RECURRENT 10/15/2019   Insomnia, idiopathic 09/30/2018   Grade I diastolic dysfunction 09/30/2018   Encounter for chronic pain management 02/11/2014   Diabetes (HCC) 02/11/2014   Cystocele 09/14/2012   Chronic pain of both knees 05/28/2010   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   Allergic rhinitis 05/30/2008   Carpal tunnel syndrome of left wrist 01/17/2008   OBESITY HYPOVENTILATION SYNDROME 12/27/2007   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  She has been told that she has arthritis in both knees.  She was seen at [facility] orthopedics recently and told she needed to work on strengthening her legs and they have actually ordered therapy.  They also discussed with her that she needed to  work on weight loss which she is doing.  She states she has had bilateral knee pain for some 30 years.  No known injury.  Radiographs in 2019 are available and show severe tricompartmental arthritis of the right knee.  She does take diclofenac Tylenol Lyrica and oxycodone for her knee pain but states this really helped.  She is also tried injections both cortisone and gel injections without any real relief.  She notes that she has been basically dependent upon a motorized wheelchair for the last 2 years.  She does ambulate short distances though.  She states prior to the motorized wheelchair she was walking with a walker.  She is lost from 430 pounds down to 316 pounds.  States due to  her myasthenia gravis she has been on prednisone.  Has gained a lot of weight over the years.  She has also had weakness in the extremities for years.  Reports that her diabetes is under good control at this point in time.  PRECAUTIONS/RESTRICTIONS:   FALL  SUBJECTIVE: Patient returns to OPPT for continued treatment of ongoing B knee pain, weakness and stiffness.  Has not been compliant with HEP and feels aquatic therapy is the only exercise she can tolerate.   PAIN:  Are you having pain? Yes: NPRS scale: 7/10 (R>L) Pain location: B knees Pain description: ache Aggravating factors: everything Relieving factors: nothing  OBJECTIVE: (objective measures completed at initial evaluation unless otherwise dated)  DIAGNOSTIC FINDINGS: CLINICAL DATA:  57 year old female with a history of chronic right knee pain   EXAM: RIGHT KNEE 3 VIEWS   COMPARISON:  08/03/2013   FINDINGS: No acute displaced fracture. Advanced joint space narrowing of the mediolateral compartment with marginal osteophyte formation, sclerotic changes. Degenerative changes of the patellofemoral joint. Changes have not progressed since the comparison of 08/03/2013   IMPRESSION: Advanced tricompartmental osteoarthritis, worse than the  comparison study of 2015.   No acute bony abnormality identified.     Electronically Signed   By: Gilmer Mor D.O.   On: 11/09/2017 09:34     PATIENT SURVEYS:  FOTO 21(56 predicted)    COGNITION: Overall cognitive status: Within functional limits for tasks assessed                         SENSATION: Not tested   MUSCLE LENGTH: UTA due to pain levels   POSTURE:  Flexed knee/hip posture   PALPATION: deferred   LOWER EXTREMITY ROM:   Active ROM Right eval Left eval R 04/22/22 L  04/22/22 R 06/01/22 L  06/01/22 R 06/24/22 L 06/24/22 R 08/19/22 L 08/19/22  Hip flexion              Hip extension              Hip abduction              Hip adduction              Hip internal rotation              Hip external rotation              Knee flexion 92d 100d 110d 95d 110d 98d 104d 96d 98d 100d  Knee extension -25d -25 -40/-35d -30/-45d -30d -25d -33d -32d -38 -32  Ankle dorsiflexion              Ankle plantarflexion              Ankle inversion              Ankle eversion               (Blank rows = not tested)   LOWER EXTREMITY MMT:   MMT Right eval Left eval R 04/22/22 L 04/22/22 R 06/24/22 L  06/24/22 R 08/19/22 L 08/19/22  Hip flexion 3 3 3+ 3+ 3+ 3+ 3 3  Hip extension            Hip abduction 3 3 3+ 3+ 3+ 3+    Hip adduction            Hip internal rotation            Hip external rotation  Knee flexion 3+ 3+ 3+ 3+ 3+ 3+ 3 3  Knee extension 3+ 3+ 3+ 3+ 3+ 3+ 3 3  Ankle dorsiflexion            Ankle plantarflexion 3+ 3+ UTA UTA      Ankle inversion            Ankle eversion             PF strength testing deferred due to B achilles pain.   LOWER EXTREMITY SPECIAL TESTS:  UTA due to pain and limited tolerance to testing positions    FUNCTIONAL TESTS:  30 seconds chair stand test 0 reps 04/22/22 30s chair stand test 4 reps with UE support 06/24/22 30s chair stand 7 reps with UE support 08/19/22 30s chair stand 7 reps with UE support    GAIT: Distance walked: 3ft Assistive device utilized: Wheelchair (power) Level of assistance: Complete Independence Comments: Patient using power chair for mobility 04/22/22 25ft with SBA and B canes 08/20/22 85ft with B canes 2 MWT      PATIENT EDUCATION:  Education details: Discussed eval findings, rehab rationale and POC and patient is in agreement  Person educated: Patient Education method: Explanation Education comprehension: verbalized understanding and needs further education   HOME EXERCISE PROGRAM: AAccess Code: [email or web address] URL: [email or web address] Date: 04/22/2022 Prepared by: Gustavus Bryant  Exercises - Seated Long Arc Quad  - 3 x daily - 5 x weekly - 2 sets - 10 reps - Seated Hamstring Stretch  - 3 x daily - 5 x weekly - 1 sets - 2 reps - 30s hold - Seated Heel Slide  - 3 x daily - 5 x weekly - 2 sets - 10 reps - 2s hold   [facility] Adult PT Treatment:                                                DATE: 08/26/22 Aquatic therapy at [facility] [address] - therapeutic pool temp 91 degrees in lap pool Pt enters building via W/C and son accompanying. Treatment took place in water 3.8 to 4 ft 8 in.feet deep depending upon activity.  Pt entered and exited the pool via stair and handrails.  Aquatic Exercise: Water walking for warm up and cool down - forwards/backwards/side stepping with blue DB  Sidestepping with shoulder abd/add blue DB x 2 laps Walking march with blue DB x2 laps Trunk rotation and straight leg kick with blue DB x20 BIL Water jogging in place with blue DB x5' Step ups on submerged step x10 fwd/lat BIL (mild pain in Rt hip after lateral) At edge of pool, pt performed LE exercise Heel/toe raises - 2x20 Hip extension x20 BIL Hip circles CW/CCW x10 each BIL Hip flexion/extension knee straight x20 BIL   Hip abd/add x20 BIL   Pt requires the buoyancy of water for active assisted exercises with buoyancy supported for strengthening and  AROM exercises. Hydrostatic pressure also supports joints by unweighting joint load by at least 50 % in 3-4 feet depth water. 80% in chest to neck deep water. Water will provide assistance with movement using the current and laminar flow while the buoyancy reduces weight bearing. Pt requires the viscosity of the water for resistance with strengthening exercises.  [facility] Adult PT Treatment:  DATE: 08/19/22 Therapeutic Exercise: [facility] Adult PT Treatment:                                                DATE: 07/16/22 Aquatic therapy at [facility] [address] - therapeutic pool temp 91 degrees in lap pool Pt enters building via W/C and son accompanying. Treatment took place in water 3.8 to 4 ft 8 in.feet deep depending upon activity.  Pt entered and exited the pool via stair and handrails.  Aquatic Exercise: Water walking for warm up and cool down - forwards/backwards/side stepping with blue DB  Sidestepping with shoulder abd/add blue DB x 2 laps Walking march with blue DB x2 laps Trunk rotation and straight leg kick with blue DB x20 BIL Water jogging in place with blue DB x5' Step ups on submerged step x10 fwd/lat BIL (mild pain in Rt hip after lateral) At edge of pool, pt performed LE exercise Heel/toe raises - 2x20 Hip extension x20 BIL Hip circles CW/CCW x10 each BIL Hip flexion/extension knee straight x20 BIL   Hip abd/add x20 BIL   Pt requires the buoyancy of water for active assisted exercises with buoyancy supported for strengthening and AROM exercises. Hydrostatic pressure also supports joints by unweighting joint load by at least 50 % in 3-4 feet depth water. 80% in chest to neck deep water. Water will provide assistance with movement using the current and laminar flow while the buoyancy reduces weight bearing. Pt requires the viscosity of the water for resistance with strengthening exercises.   [facility] Adult PT Treatment:                                                 DATE: 07/02/22 Aquatic therapy at [facility] [address] - therapeutic pool temp 91 degrees in lap pool Pt enters building via W/C and son accompanying. Treatment took place in water 3.8 to 4 ft 8 in.feet deep depending upon activity.  Pt entered and exited the pool via stair and handrails.  Aquatic Exercise: Water walking for warm up and cool down - forwards/backwards/side stepping with blue DB  Sidestepping with shoulder abd/add blue DB x 2 laps Walking march with blue DB x2 laps Trunk rotation and straight leg kick with blue DB x20 BIL Water jogging in place with blue DB x5' At edge of pool, pt performed LE exercise Heel raises - 2x20 Hip extension x20 BIL Mini squats 2x20 Hip circles CW/CCW x10 each BIL Hip flexion/extension knee straight x20 BIL   Hip abd/add x20 BIL   Pt requires the buoyancy of water for active assisted exercises with buoyancy supported for strengthening and AROM exercises. Hydrostatic pressure also supports joints by unweighting joint load by at least 50 % in 3-4 feet depth water. 80% in chest to neck deep water. Water will provide assistance with movement using the current and laminar flow while the buoyancy reduces weight bearing. Pt requires the viscosity of the water for resistance with strengthening exercises.   [facility] Adult PT Treatment:  DATE: 06/25/22 Aquatic therapy at [facility] [address] - therapeutic pool temp 91 degrees in lap pool Pt enters building via W/C and son accompanying. Treatment took place in water 3.8 to 4 ft 8 in.feet deep depending upon activity.  Pt entered and exited the pool via stair and handrails.  Aquatic Exercise: Water walking for warm up and cool down - forwards/backwards/side stepping with blue DB  Sidestepping with shoulder abd/add blue DB x 2 laps Walking march with blue DB x2 laps Back against wall with blue DB:  shoulder flexion/ext, abd/add, horizontal abd/add Trunk rotation and straight leg kick with blue DB x20 BIL Water jogging on place with blue DB x5' At edge of pool, pt performed LE exercise Heel raises - 2x20 Hip extension x20 BIL Mini squats x20 Hip circles CW/CCW x10 each BIL Hip flexion/extension knee straight x20 BIL   Hip abd/add x20 BIL   Pt requires the buoyancy of water for active assisted exercises with buoyancy supported for strengthening and AROM exercises. Hydrostatic pressure also supports joints by unweighting joint load by at least 50 % in 3-4 feet depth water. 80% in chest to neck deep water. Water will provide assistance with movement using the current and laminar flow while the buoyancy reduces weight bearing. Pt requires the viscosity of the water for resistance with strengthening exercises.    ASSESSMENT:   CLINICAL IMPRESSION:   Session today focused on knee and hip strength in the aquatic environment for use of buoyancy to offload joints and the viscosity of water as resistance during therapeutic exercise.  Pt with increased pain with step up (L>R knee) and hip abd (R>L) these activities were modified with UE support to reduce discomfort.  Patient was able to tolerate all prescribed exercises in the aquatic environment with no adverse effects and reports 7/10 pain at the end of the session. Patient continues to benefit from skilled PT services on land and aquatic based and should be progressed as able to improve functional independence.     OBJECTIVE IMPAIRMENTS: Abnormal gait, decreased activity tolerance, decreased balance, decreased endurance, decreased knowledge of condition, decreased knowledge of use of DME, decreased mobility, difficulty walking, decreased ROM, decreased strength, obesity, and pain.    ACTIVITY LIMITATIONS: carrying, lifting, standing, squatting, stairs, and transfers   PERSONAL FACTORS: Age, Fitness, Past/current experiences, and Time since  onset of injury/illness/exacerbation are also affecting patient's functional outcome.    REHAB POTENTIAL: Fair based on chronicity, unsuccessful conservative interventions and advanced OA of B knees   CLINICAL DECISION MAKING: Stable/uncomplicated   EVALUATION COMPLEXITY: Low     GOALS: Goals reviewed with patient? No   SHORT TERM GOALS: Target date: 02/03/2022   Patient to demonstrate independence in HEP  Baseline:[email or web address] Goal status: Ongoing Pt reports non-compliance 08/19/22   2.  Initiate aquatic program Baseline: TBD; 08/19/22 Established Goal status: Met      LONG TERM GOALS: Target date: 09/22/2022     Increase BLE strength to 4-/5 in deficit areas Baseline:  MMT Right eval Left eval R 04/22/22 L 04/22/22 R 06/24/22 L  06/24/22 R 08/19/22 L 08/19/22  Hip flexion 3 3 3+ 3+ 3+ 3+ 3 3  Hip extension            Hip abduction 3 3 3+ 3+ 3+ 3+    Hip adduction            Hip internal rotation            Hip external rotation  Knee flexion 3+ 3+ 3+ 3+ 3+ 3+ 3 3  Knee extension 3+ 3+ 3+ 3+ 3+ 3+ 3 3  Ankle dorsiflexion            Ankle plantarflexion 3+ 3+ UTA UTA      Ankle inversion            Ankle eversion              Goal status: INITIAL   2.  Increase FOTO score to 56 Baseline: 21 Goal status: INITIAL   3.  Increase B knee extension AROM to -10d B and flexion to 115d B Baseline:  Active ROM Right eval Left eval R 04/22/22 L  04/22/22 R 06/01/22 L  06/01/22 R 06/24/22 L 06/24/22 R 08/19/22 L 08/19/22  Hip flexion              Hip extension              Hip abduction              Hip adduction              Hip internal rotation              Hip external rotation              Knee flexion 92d 100d 110d 95d 110d 98d 104d 96d 98d 100d  Knee extension -25d -25 -40/-35d -30/-45d -30d -25d -33d -32d -38 -32  Ankle dorsiflexion              Ankle plantarflexion              Ankle inversion              Ankle eversion               (Blank rows =  not tested)  Goal status: Ongoing  4. Increase ambulation distance to 167ft with single cane  Baseline: 37ft with B canes and SBA; 06/01/22 37ft with B canes; 08/19/22 63ft with B canes  Goal Status: Ongoing  5. Increase 30 second chair stand reps to 6 with UE support  Baseline: 4 with UE support; 08/19/22 7 stands  Goal Status: Met       PLAN:   PT FREQUENCY: 2x/week   PT DURATION: 6 weeks   PLANNED INTERVENTIONS: Therapeutic exercises, Therapeutic activity, Neuromuscular re-education, Balance training, Gait training, Patient/Family education, Self Care, Joint mobilization, Stair training, DME instructions, and Re-evaluation, aquatic therapy   PLAN FOR NEXT SESSION: HEP review and update, ROM, strengthening, gait training and functional activities  Kimberlee Nearing  PT  08/27/22 7:22 AM

## 2022-08-26 ENCOUNTER — Other Ambulatory Visit: Payer: Self-pay

## 2022-08-26 ENCOUNTER — Other Ambulatory Visit (HOSPITAL_COMMUNITY): Payer: Self-pay

## 2022-08-26 ENCOUNTER — Encounter: Payer: Self-pay | Admitting: Physical Therapy

## 2022-08-26 ENCOUNTER — Ambulatory Visit: Payer: 59 | Attending: Family Medicine | Admitting: Physical Therapy

## 2022-08-26 DIAGNOSIS — G8929 Other chronic pain: Secondary | ICD-10-CM | POA: Diagnosis not present

## 2022-08-26 DIAGNOSIS — M25562 Pain in left knee: Secondary | ICD-10-CM | POA: Diagnosis not present

## 2022-08-26 DIAGNOSIS — M6281 Muscle weakness (generalized): Secondary | ICD-10-CM | POA: Insufficient documentation

## 2022-08-26 DIAGNOSIS — M25561 Pain in right knee: Secondary | ICD-10-CM | POA: Insufficient documentation

## 2022-08-26 MED ORDER — DICLOFENAC SODIUM 50 MG PO TBEC
50.0000 mg | DELAYED_RELEASE_TABLET | Freq: Two times a day (BID) | ORAL | 0 refills | Status: DC | PRN
Start: 2022-08-26 — End: 2022-10-05
  Filled 2022-08-26: qty 60, 30d supply, fill #0

## 2022-08-26 MED ORDER — OXYBUTYNIN CHLORIDE 5 MG PO TABS
5.0000 mg | ORAL_TABLET | Freq: Two times a day (BID) | ORAL | 1 refills | Status: DC
  Filled 2022-08-26: qty 60, 30d supply, fill #0
  Filled 2022-10-05: qty 60, 30d supply, fill #1

## 2022-08-26 MED ORDER — ACCU-CHEK GUIDE VI STRP
ORAL_STRIP | 6 refills | Status: AC
  Filled 2022-08-26: qty 100, 33d supply, fill #0
  Filled 2022-10-05: qty 100, 33d supply, fill #1

## 2022-08-26 MED ORDER — EMPAGLIFLOZIN 10 MG PO TABS
10.0000 mg | ORAL_TABLET | Freq: Every day | ORAL | 3 refills | Status: DC
Start: 2022-08-26 — End: 2023-09-23
  Filled 2022-08-26: qty 90, 90d supply, fill #0
  Filled 2022-12-18: qty 90, 90d supply, fill #1
  Filled 2023-01-27 – 2023-04-07 (×2): qty 90, 90d supply, fill #2
  Filled 2023-06-28: qty 90, 90d supply, fill #3

## 2022-08-26 MED ORDER — IRBESARTAN 150 MG PO TABS
150.0000 mg | ORAL_TABLET | Freq: Every day | ORAL | 3 refills | Status: DC
  Filled 2022-08-26 – 2022-10-21 (×2): qty 90, 90d supply, fill #0
  Filled 2023-01-27: qty 90, 90d supply, fill #1
  Filled 2023-04-27: qty 90, 90d supply, fill #2
  Filled 2023-08-10: qty 90, 90d supply, fill #3

## 2022-08-27 ENCOUNTER — Ambulatory Visit: Payer: 59

## 2022-08-27 NOTE — Addendum Note (Signed)
Addended by: Hildred Laser on: 08/27/2022 07:43 AM   Modules accepted: Orders

## 2022-09-02 ENCOUNTER — Ambulatory Visit: Payer: 59 | Admitting: Physical Therapy

## 2022-09-02 ENCOUNTER — Other Ambulatory Visit: Payer: Self-pay

## 2022-09-02 ENCOUNTER — Other Ambulatory Visit (HOSPITAL_COMMUNITY): Payer: Self-pay

## 2022-09-03 ENCOUNTER — Ambulatory Visit: Payer: 59

## 2022-09-03 DIAGNOSIS — M6281 Muscle weakness (generalized): Secondary | ICD-10-CM

## 2022-09-03 DIAGNOSIS — M25562 Pain in left knee: Secondary | ICD-10-CM | POA: Diagnosis not present

## 2022-09-03 DIAGNOSIS — M25561 Pain in right knee: Secondary | ICD-10-CM | POA: Diagnosis not present

## 2022-09-03 DIAGNOSIS — G8929 Other chronic pain: Secondary | ICD-10-CM

## 2022-09-03 NOTE — Therapy (Signed)
OUTPATIENT PHYSICAL THERAPY TREATMENT NOTE  Patient Name: [name]  MRN: [id number] DOB:24-Feb-1965, 57 y.o., female Today's Date: 09/03/2022  PCP: Fayette Pho, MD    REFERRING PROVIDER: Otho Darner, MD    PT End of Session - 09/03/22 1519     Visit Number 21    Number of Visits 27    Date for PT Re-Evaluation 09/22/22    Authorization Type [employer]    PT Start Time 1518    PT Stop Time 1558    PT Time Calculation (min) 40 min    Activity Tolerance Patient limited by pain    Behavior During Therapy [facility] for tasks assessed/performed              Past Medical History:  Diagnosis Date   ALLERGIC RHINITIS    takes Zyrtec daily   Anxiety    Arthritis    Asthma    Albuterol prn;Symbicort daily and Singulair daily   Bronchitis    hx of   CAP (community acquired pneumonia) 02/20/2015   Carpal tunnel syndrome    right   Chronic back pain    Constipation    Miralax prn   Depression    takes Cymbalta daily   Diabetes (HCC)    type 2    Eczema    Gallstones 2010   GERD (gastroesophageal reflux disease)    takes Omeprazole daily   Hemorrhoids    Hypertension    takes Prinizide daily and Clonidine on Mondays   Hypoventilation associated with obesity syndrome (HCC)    Insomnia    Irritable bladder    Joint pain    Morbid (severe) obesity due to excess calories (HCC) 04/10/2007   Restrictive changes on pfts 11/30/2007     Myasthenia gravis 1994   Positive Acetylcholine receptor Ab and single fiber EMG Dr. Clarisa [facility]- Dr. 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  Coralyn Helling- 10/30/2007 ANA positive, ANA titer  negative, RF less than 20   Rheumatoid arthritis(714.0)    Sarcoidosis 12/23/2010   Derm  dx NCG  12/03/10 (path report in EPIC)    Overview:  Overview:  Diagnosed on skin biopsy November 2012  Last Assessment & Plan:  Labs are unremarkable. CXR improved/stable. PFT wnl. I am happy patient had these tests and we have a baseline for her. At this time, I do not see an indication for starting chronic steroids daily. Patient agrees. Her skin lesions are bothering her the most and syst   Shortness of breath    can be sitting as well as exertion   Skin irritation    skin itchy   Trigger finger    Urinary frequency    takes Ditropan daily   Viral meningitis    history of viral meningitis   Past Surgical History:  Procedure Laterality Date   BRONCHOSCOPY  08/2007   CARPAL TUNNEL RELEASE  05/20/2011   Procedure: CARPAL TUNNEL RELEASE;  Surgeon: [name], MD;  Location: [facility] OR;  Service: Orthopedics;  Laterality: Right;  CARPAL TUNNEL RELEASE Right 11/22/2019   Procedure: RIGHT HAND CARPAL TUNNEL RELEASE;  Surgeon: Jones Broom, MD;  Location: [facility] ORS;  Service: Orthopedics;  Laterality: Right;   CARPAL TUNNEL RELEASE Left 02/25/2022   Procedure: CARPAL TUNNEL RELEASE;  Surgeon: Jones Broom, MD;  Location: [facility] ORS;  Service: Orthopedics;  Laterality: Left;   CESAREAN SECTION  09/15/2004   COLONOSCOPY N/A 01/21/2014   Procedure: COLONOSCOPY;  Surgeon: Iva Boop, MD;  Location: [facility] ENDOSCOPY;  Service: Endoscopy;  Laterality: N/A;   cortisone injection     receives an injection every 3months   ESOPHAGOGASTRODUODENOSCOPY     ESOPHAGOGASTRODUODENOSCOPY N/A 01/21/2014   Procedure: ESOPHAGOGASTRODUODENOSCOPY (EGD);  Surgeon: Iva Boop, MD;  Location: [facility];  Service: Endoscopy;  Laterality: N/A;   LAPAROSCOPIC CHOLECYSTECTOMY  07/2008   by Dr. Cyndia Bent   Thymus resection  08/25/1993   TRIGGER FINGER RELEASE   05/20/2011   Procedure: RELEASE TRIGGER FINGER/A-1 PULLEY;  Surgeon: [name], MD;  Location: [facility];  Service: Orthopedics;  Laterality: Right;  RIGHT TRIGGER THUMB RELEASE AND RIGHT CARPAL TUNNEL RELEASE   Patient Active Problem List   Diagnosis Date Noted   Weight loss advised 03/07/2022   Drug-induced constipation 12/09/2021   Anxiety 09/23/2021   Chronic pain of both shoulders 03/24/2021   Left wrist pain 03/24/2021   Diabetic neuropathy (HCC) 02/25/2021   Healthcare maintenance 10/21/2020   Hyperlipidemia (primary prevention, LDL goal <70) 09/04/2020   Wheelchair dependence 05/05/2020   Carpal tunnel syndrome of right wrist, RECURRENT 10/15/2019   Insomnia, idiopathic 09/30/2018   Grade I diastolic dysfunction 09/30/2018   Encounter for chronic pain management 02/11/2014   Diabetes (HCC) 02/11/2014   Cystocele 09/14/2012   Chronic pain of both knees 05/28/2010   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   Allergic rhinitis 05/30/2008   Carpal tunnel syndrome of left wrist 01/17/2008   OBESITY HYPOVENTILATION SYNDROME 12/27/2007   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  She has been told that she has arthritis in both knees.  She was seen at [facility] recently and told she needed to work on strengthening her legs and they have actually ordered therapy.  They also discussed with her that she needed to  work on weight loss which she is doing.  She states she has had bilateral knee pain for some 30 years.  No known injury.  Radiographs in 2019 are available and show severe tricompartmental arthritis of the right knee.  She does take diclofenac Tylenol Lyrica and oxycodone for her knee pain but states this really helped.  She is also tried injections both cortisone and gel injections without any real relief.  She notes that she has been basically dependent upon a motorized wheelchair for the last 2 years.  She does ambulate short distances though.  She states prior to the motorized wheelchair she was walking with a walker.  She is lost from 430 pounds down to 316 pounds.  States due to her myasthenia gravis  she has been on prednisone.  Has gained a lot of weight over the years.  She has also had weakness in the extremities for years.  Reports that her diabetes is under good control at this point in time.  PRECAUTIONS/RESTRICTIONS:   FALL  SUBJECTIVE: Patient reports continued knee and  left hand/finger pain.   PAIN:  Are you having pain? Yes: NPRS scale: 7/10 (R>L) Pain location: B knees Pain description: ache Aggravating factors: everything Relieving factors: nothing  OBJECTIVE: (objective measures completed at initial evaluation unless otherwise dated)  DIAGNOSTIC FINDINGS: CLINICAL DATA:  57 year old female with a history of chronic right knee pain   EXAM: RIGHT KNEE 3 VIEWS   COMPARISON:  08/03/2013   FINDINGS: No acute displaced fracture. Advanced joint space narrowing of the mediolateral compartment with marginal osteophyte formation, sclerotic changes. Degenerative changes of the patellofemoral joint. Changes have not progressed since the comparison of 08/03/2013   IMPRESSION: Advanced tricompartmental osteoarthritis, worse than the comparison study of 2015.   No acute bony abnormality identified.     Electronically Signed   By: Gilmer Mor D.O.   On: 11/09/2017  09:34     PATIENT SURVEYS:  FOTO 21(56 predicted)    COGNITION: Overall cognitive status: Within functional limits for tasks assessed                         SENSATION: Not tested   MUSCLE LENGTH: UTA due to pain levels   POSTURE:  Flexed knee/hip posture   PALPATION: deferred   LOWER EXTREMITY ROM:   Active ROM Right eval Left eval R 04/22/22 L  04/22/22 R 06/01/22 L  06/01/22 R 06/24/22 L 06/24/22 R 08/19/22 L 08/19/22  Hip flexion              Hip extension              Hip abduction              Hip adduction              Hip internal rotation              Hip external rotation              Knee flexion 92d 100d 110d 95d 110d 98d 104d 96d 98d 100d  Knee extension -25d -25 -40/-35d -30/-45d -30d -25d -33d -32d -38 -32  Ankle dorsiflexion              Ankle plantarflexion              Ankle inversion              Ankle eversion               (Blank rows = not tested)   LOWER EXTREMITY MMT:   MMT Right eval Left eval R 04/22/22 L 04/22/22 R 06/24/22 L  06/24/22 R 08/19/22 L 08/19/22  Hip flexion 3 3 3+ 3+ 3+ 3+ 3 3  Hip extension            Hip abduction 3 3 3+ 3+ 3+ 3+    Hip adduction            Hip internal rotation            Hip external rotation            Knee flexion 3+ 3+ 3+ 3+ 3+ 3+ 3 3  Knee extension 3+ 3+ 3+  3+ 3+ 3+ 3 3  Ankle dorsiflexion            Ankle plantarflexion 3+ 3+ UTA UTA      Ankle inversion            Ankle eversion             PF strength testing deferred due to B achilles pain.   LOWER EXTREMITY SPECIAL TESTS:  UTA due to pain and limited tolerance to testing positions    FUNCTIONAL TESTS:  30 seconds chair stand test 0 reps 04/22/22 30s chair stand test 4 reps with UE support 06/24/22 30s chair stand 7 reps with UE support 08/19/22 30s chair stand 7 reps with UE support   GAIT: Distance walked: 53ft Assistive device utilized: Wheelchair (power) Level of assistance: Complete Independence Comments: Patient using power  chair for mobility 04/22/22 94ft with SBA and B canes 08/20/22 7ft with B canes 2 MWT      PATIENT EDUCATION:  Education details: Discussed eval findings, rehab rationale and POC and patient is in agreement  Person educated: Patient Education method: Explanation Education comprehension: verbalized understanding and needs further education   HOME EXERCISE PROGRAM: AAccess Code: [email or web address] URL: [email or web address] Date: 04/22/2022 Prepared by: Gustavus Bryant  Exercises - Seated Long Arc Quad  - 3 x daily - 5 x weekly - 2 sets - 10 reps - Seated Hamstring Stretch  - 3 x daily - 5 x weekly - 1 sets - 2 reps - 30s hold - Seated Heel Slide  - 3 x daily - 5 x weekly - 2 sets - 10 reps - 2s hold  [facility] Adult PT Treatment:                                                DATE: 09/03/22 Aquatic therapy at [facility] [address] - therapeutic pool temp 91 degrees. Pt enters building via W/C and son accompanying. Treatment took place in water 3.8 to 4 ft 8 in.feet deep depending upon activity.  Pt entered and exited the pool via stair and handrails.  Aquatic Exercise: Water walking for warm up and cool down - forwards/backwards/side stepping with blue DB  Sidestepping with shoulder abd/add blue DB x 2 laps Walking march with blue DB x2 laps Water jogging in place with blue DB x5' At edge of pool, pt performed LE exercise Heel/toe raises (too painful in Rt knee today) Hip extension x20 BIL Hip circles CW/CCW x10 each BIL Hip flexion/extension knee straight x20 BIL   Hip abd/add x20 BIL   Pt requires the buoyancy of water for active assisted exercises with buoyancy supported for strengthening and AROM exercises. Hydrostatic pressure also supports joints by unweighting joint load by at least 50 % in 3-4 feet depth water. 80% in chest to neck deep water. Water will provide assistance with movement using the current and laminar flow while the buoyancy reduces weight bearing. Pt  requires the viscosity of the water for resistance with strengthening exercises.  [facility] Adult PT Treatment:                                                DATE: 08/26/22 Aquatic therapy at [facility] [address] -  therapeutic pool temp 91 degrees in lap pool Pt enters building via W/C and son accompanying. Treatment took place in water 3.8 to 4 ft 8 in.feet deep depending upon activity.  Pt entered and exited the pool via stair and handrails.  Aquatic Exercise: Water walking for warm up and cool down - forwards/backwards/side stepping with blue DB  Sidestepping with shoulder abd/add blue DB x 2 laps Walking march with blue DB x2 laps Trunk rotation and straight leg kick with blue DB x20 BIL Water jogging in place with blue DB x5' Step ups on submerged step x10 fwd/lat BIL (mild pain in Rt hip after lateral) At edge of pool, pt performed LE exercise Heel/toe raises - 2x20 Hip extension x20 BIL Hip circles CW/CCW x10 each BIL Hip flexion/extension knee straight x20 BIL   Hip abd/add x20 BIL   Pt requires the buoyancy of water for active assisted exercises with buoyancy supported for strengthening and AROM exercises. Hydrostatic pressure also supports joints by unweighting joint load by at least 50 % in 3-4 feet depth water. 80% in chest to neck deep water. Water will provide assistance with movement using the current and laminar flow while the buoyancy reduces weight bearing. Pt requires the viscosity of the water for resistance with strengthening exercises.  [facility] Adult PT Treatment:                                                DATE: 08/19/22 Therapeutic Exercise: Re-assessment    ASSESSMENT:   CLINICAL IMPRESSION:  Patient presents to aquatic PT session reporting continued knee pain R>L and also pain in her left hand/fingers. Session today focused on proximal hip and knee strengthening along with consistent movement for duration of session in the aquatic environment for use of  buoyancy to offload joints and the viscosity of water as resistance during therapeutic exercise. Patient was able to tolerate all prescribed exercises in the aquatic environment with no adverse effects. Patient continues to benefit from skilled PT services on land and aquatic based and should be progressed as able to improve functional independence.    OBJECTIVE IMPAIRMENTS: Abnormal gait, decreased activity tolerance, decreased balance, decreased endurance, decreased knowledge of condition, decreased knowledge of use of DME, decreased mobility, difficulty walking, decreased ROM, decreased strength, obesity, and pain.    ACTIVITY LIMITATIONS: carrying, lifting, standing, squatting, stairs, and transfers   PERSONAL FACTORS: Age, Fitness, Past/current experiences, and Time since onset of injury/illness/exacerbation are also affecting patient's functional outcome.    REHAB POTENTIAL: Fair based on chronicity, unsuccessful conservative interventions and advanced OA of B knees   CLINICAL DECISION MAKING: Stable/uncomplicated   EVALUATION COMPLEXITY: Low     GOALS: Goals reviewed with patient? No   SHORT TERM GOALS: Target date: 02/03/2022   Patient to demonstrate independence in HEP  Baseline:[email or web address] Goal status: Ongoing Pt reports non-compliance 08/19/22   2.  Initiate aquatic program Baseline: TBD; 08/19/22 Established Goal status: Met      LONG TERM GOALS: Target date: 09/22/2022     Increase BLE strength to 4-/5 in deficit areas Baseline:  MMT Right eval Left eval R 04/22/22 L 04/22/22 R 06/24/22 L  06/24/22 R 08/19/22 L 08/19/22  Hip flexion 3 3 3+ 3+ 3+ 3+ 3 3  Hip extension            Hip abduction 3 3 3+  3+ 3+ 3+    Hip adduction            Hip internal rotation            Hip external rotation            Knee flexion 3+ 3+ 3+ 3+ 3+ 3+ 3 3  Knee extension 3+ 3+ 3+ 3+ 3+ 3+ 3 3  Ankle dorsiflexion            Ankle plantarflexion 3+ 3+ UTA UTA      Ankle inversion             Ankle eversion              Goal status: INITIAL   2.  Increase FOTO score to 56 Baseline: 21 Goal status: INITIAL   3.  Increase B knee extension AROM to -10d B and flexion to 115d B Baseline:  Active ROM Right eval Left eval R 04/22/22 L  04/22/22 R 06/01/22 L  06/01/22 R 06/24/22 L 06/24/22 R 08/19/22 L 08/19/22  Hip flexion              Hip extension              Hip abduction              Hip adduction              Hip internal rotation              Hip external rotation              Knee flexion 92d 100d 110d 95d 110d 98d 104d 96d 98d 100d  Knee extension -25d -25 -40/-35d -30/-45d -30d -25d -33d -32d -38 -32  Ankle dorsiflexion              Ankle plantarflexion              Ankle inversion              Ankle eversion               (Blank rows = not tested)  Goal status: Ongoing  4. Increase ambulation distance to 157ft with single cane  Baseline: 45ft with B canes and SBA; 06/01/22 33ft with B canes; 08/19/22 67ft with B canes  Goal Status: Ongoing  5. Increase 30 second chair stand reps to 6 with UE support  Baseline: 4 with UE support; 08/19/22 7 stands  Goal Status: Met       PLAN:   PT FREQUENCY: 2x/week   PT DURATION: 6 weeks   PLANNED INTERVENTIONS: Therapeutic exercises, Therapeutic activity, Neuromuscular re-education, Balance training, Gait training, Patient/Family education, Self Care, Joint mobilization, Stair training, DME instructions, and Re-evaluation, aquatic therapy   PLAN FOR NEXT SESSION: HEP review and update, ROM, strengthening, gait training and functional activities  Berta Minor PTA  09/03/22 3:20 PM

## 2022-09-09 ENCOUNTER — Encounter: Payer: Self-pay | Admitting: Physical Therapy

## 2022-09-09 ENCOUNTER — Ambulatory Visit: Payer: 59 | Admitting: Physical Therapy

## 2022-09-09 DIAGNOSIS — G8929 Other chronic pain: Secondary | ICD-10-CM

## 2022-09-09 DIAGNOSIS — M25562 Pain in left knee: Secondary | ICD-10-CM | POA: Diagnosis not present

## 2022-09-09 DIAGNOSIS — M6281 Muscle weakness (generalized): Secondary | ICD-10-CM | POA: Diagnosis not present

## 2022-09-09 DIAGNOSIS — M25561 Pain in right knee: Secondary | ICD-10-CM | POA: Diagnosis not present

## 2022-09-09 NOTE — Therapy (Signed)
OUTPATIENT PHYSICAL THERAPY TREATMENT NOTE  Patient Name: [name]  MRN: [id number] DOB:[birth year], 57 y.o., female Today's Date: 09/09/2022  PCP: Fayette Pho, MD    REFERRING PROVIDER: Otho Darner, MD    PT End of Session - 09/09/22 1526     Visit Number 22    Number of Visits 27    Date for PT Re-Evaluation 09/22/22    Authorization Type [employer]    PT Start Time 1530    PT Stop Time 1612    PT Time Calculation (min) 42 min    Activity Tolerance Patient limited by pain    Behavior During Therapy [facility] for tasks assessed/performed               Past Medical History:  Diagnosis Date   ALLERGIC RHINITIS    takes Zyrtec daily   Anxiety    Arthritis    Asthma    Albuterol prn;Symbicort daily and Singulair daily   Bronchitis    hx of   CAP (community acquired pneumonia) 02/20/2015   Carpal tunnel syndrome    right   Chronic back pain    Constipation    Miralax prn   Depression    takes Cymbalta daily   Diabetes (HCC)    type 2    Eczema    Gallstones 2010   GERD (gastroesophageal reflux disease)    takes Omeprazole daily   Hemorrhoids    Hypertension    takes Prinizide daily and Clonidine on Mondays   Hypoventilation associated with obesity syndrome (HCC)    Insomnia    Irritable bladder    Joint pain    Morbid (severe) obesity due to excess calories (HCC) 04/10/2007   Restrictive changes on pfts 11/30/2007     Myasthenia gravis 1994   Positive Acetylcholine receptor Ab and single fiber EMG Dr. [facility]- Dr. 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  Coralyn Helling- 10/30/2007 ANA positive, ANA titer  negative, RF less than 20   Rheumatoid arthritis(714.0)    Sarcoidosis 12/23/2010   Derm  dx NCG  12/03/10 (path report in EPIC)    Overview:  Overview:  Diagnosed on skin biopsy November 2012  Last Assessment & Plan:  Labs are unremarkable. CXR improved/stable. PFT wnl. I am happy patient had these tests and we have a baseline for her. At this time, I do not see an indication for starting chronic steroids daily. Patient agrees. Her skin lesions are bothering her the most and syst   Shortness of breath    can be sitting as well as exertion   Skin irritation    skin itchy   Trigger finger    Urinary frequency    takes Ditropan daily   Viral meningitis    history of viral meningitis   Past Surgical History:  Procedure Laterality Date   BRONCHOSCOPY  08/2007   CARPAL TUNNEL RELEASE  05/20/2011   Procedure: CARPAL TUNNEL RELEASE;  Surgeon: [name], MD;  Location: [facility] OR;  Service: Orthopedics;  Laterality: Right;  CARPAL TUNNEL RELEASE Right 11/22/2019   Procedure: RIGHT HAND CARPAL TUNNEL RELEASE;  Surgeon: Jones Broom, MD;  Location: [facility] ORS;  Service: Orthopedics;  Laterality: Right;   CARPAL TUNNEL RELEASE Left 02/25/2022   Procedure: CARPAL TUNNEL RELEASE;  Surgeon: Jones Broom, MD;  Location: [facility] ORS;  Service: Orthopedics;  Laterality: Left;   CESAREAN SECTION  09/15/2004   COLONOSCOPY N/A 01/21/2014   Procedure: COLONOSCOPY;  Surgeon: Iva Boop, MD;  Location: [facility] ENDOSCOPY;  Service: Endoscopy;  Laterality: N/A;   cortisone injection     receives an injection every 3months   ESOPHAGOGASTRODUODENOSCOPY     ESOPHAGOGASTRODUODENOSCOPY N/A 01/21/2014   Procedure: ESOPHAGOGASTRODUODENOSCOPY (EGD);  Surgeon: Iva Boop, MD;  Location: [facility];  Service: Endoscopy;  Laterality: N/A;   LAPAROSCOPIC CHOLECYSTECTOMY  07/2008   by Dr. Cyndia Bent   Thymus resection  08/25/1993   TRIGGER FINGER RELEASE   05/20/2011   Procedure: RELEASE TRIGGER FINGER/A-1 PULLEY;  Surgeon: [name], MD;  Location: [facility] OR;  Service: Orthopedics;  Laterality: Right;  RIGHT TRIGGER THUMB RELEASE AND RIGHT CARPAL TUNNEL RELEASE   Patient Active Problem List   Diagnosis Date Noted   Weight loss advised 03/07/2022   Drug-induced constipation 12/09/2021   Anxiety 09/23/2021   Chronic pain of both shoulders 03/24/2021   Left wrist pain 03/24/2021   Diabetic neuropathy (HCC) 02/25/2021   Healthcare maintenance 10/21/2020   Hyperlipidemia (primary prevention, LDL goal <70) 09/04/2020   Wheelchair dependence 05/05/2020   Carpal tunnel syndrome of right wrist, RECURRENT 10/15/2019   Insomnia, idiopathic 09/30/2018   Grade I diastolic dysfunction 09/30/2018   Encounter for chronic pain management 02/11/2014   Diabetes (HCC) 02/11/2014   Cystocele 09/14/2012   Chronic pain of both knees 05/28/2010   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   Allergic rhinitis 05/30/2008   Carpal tunnel syndrome of left wrist 01/17/2008   OBESITY HYPOVENTILATION SYNDROME 12/27/2007   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  She has been told that she has arthritis in both knees.  She was seen at [facility] orthopedics recently and told she needed to work on strengthening her legs and they have actually ordered therapy.  They also discussed with her that she needed to  work on weight loss which she is doing.  She states she has had bilateral knee pain for some 30 years.  No known injury.  Radiographs in 2019 are available and show severe tricompartmental arthritis of the right knee.  She does take diclofenac Tylenol Lyrica and oxycodone for her knee pain but states this really helped.  She is also tried injections both cortisone and gel injections without any real relief.  She notes that she has been basically dependent upon a motorized wheelchair for the last 2 years.  She does ambulate short distances though.  She states prior to the motorized wheelchair she was walking with a walker.  She is lost from 430 pounds down to 316 pounds.  States due to her myasthenia gravis  she has been on prednisone.  Has gained a lot of weight over the years.  She has also had weakness in the extremities for years.  Reports that her diabetes is under good control at this point in time.  PRECAUTIONS/RESTRICTIONS:   FALL  SUBJECTIVE: Pt reports severe R knee pain @9 /10 on L today.   PAIN:  Are you having pain? Yes: NPRS scale: 7/10 (R>L) Pain location: B knees Pain description: ache Aggravating factors: everything Relieving factors: nothing  OBJECTIVE: (objective measures completed at initial evaluation unless otherwise dated)  DIAGNOSTIC FINDINGS: CLINICAL DATA:  57 year old female with a history of chronic right knee pain   EXAM: RIGHT KNEE 3 VIEWS   COMPARISON:  08/03/2013   FINDINGS: No acute displaced fracture. Advanced joint space narrowing of the mediolateral compartment with marginal osteophyte formation, sclerotic changes. Degenerative changes of the patellofemoral joint. Changes have not progressed since the comparison of 08/03/2013   IMPRESSION: Advanced tricompartmental osteoarthritis, worse than the comparison study of 2015.   No acute bony abnormality identified.     Electronically Signed   By: Gilmer Mor D.O.   On: 11/09/2017 09:34      PATIENT SURVEYS:  FOTO 21(56 predicted)    COGNITION: Overall cognitive status: Within functional limits for tasks assessed                         SENSATION: Not tested   MUSCLE LENGTH: UTA due to pain levels   POSTURE:  Flexed knee/hip posture   PALPATION: deferred   LOWER EXTREMITY ROM:   Active ROM Right eval Left eval R 04/22/22 L  04/22/22 R 06/01/22 L  06/01/22 R 06/24/22 L 06/24/22 R 08/19/22 L 08/19/22  Hip flexion              Hip extension              Hip abduction              Hip adduction              Hip internal rotation              Hip external rotation              Knee flexion 92d 100d 110d 95d 110d 98d 104d 96d 98d 100d  Knee extension -25d -25 -40/-35d -30/-45d -30d -25d -33d -32d -38 -32  Ankle dorsiflexion              Ankle plantarflexion              Ankle inversion              Ankle eversion               (Blank rows = not tested)   LOWER EXTREMITY MMT:   MMT Right eval Left eval R 04/22/22 L 04/22/22 R 06/24/22 L  06/24/22 R 08/19/22 L 08/19/22  Hip flexion 3 3 3+ 3+ 3+ 3+ 3 3  Hip extension            Hip abduction 3 3 3+ 3+ 3+ 3+    Hip adduction            Hip internal rotation            Hip external rotation            Knee flexion 3+ 3+ 3+ 3+ 3+ 3+ 3 3  Knee extension 3+ 3+  3+ 3+ 3+ 3+ 3 3  Ankle dorsiflexion            Ankle plantarflexion 3+ 3+ UTA UTA      Ankle inversion            Ankle eversion             PF strength testing deferred due to B achilles pain.   LOWER EXTREMITY SPECIAL TESTS:  UTA due to pain and limited tolerance to testing positions    FUNCTIONAL TESTS:  30 seconds chair stand test 0 reps 04/22/22 30s chair stand test 4 reps with UE support 06/24/22 30s chair stand 7 reps with UE support 08/19/22 30s chair stand 7 reps with UE support   GAIT: Distance walked: 30ft Assistive device utilized: Wheelchair (power) Level of assistance: Complete Independence Comments: Patient using power chair for  mobility 04/22/22 51ft with SBA and B canes 08/20/22 12ft with B canes 2 MWT      PATIENT EDUCATION:  Education details: Discussed eval findings, rehab rationale and POC and patient is in agreement  Person educated: Patient Education method: Explanation Education comprehension: verbalized understanding and needs further education   HOME EXERCISE PROGRAM: AAccess Code: [email or web address] URL: [email or web address] Date: 04/22/2022 Prepared by: Gustavus Bryant  Exercises - Seated Long Arc Quad  - 3 x daily - 5 x weekly - 2 sets - 10 reps - Seated Hamstring Stretch  - 3 x daily - 5 x weekly - 1 sets - 2 reps - 30s hold - Seated Heel Slide  - 3 x daily - 5 x weekly - 2 sets - 10 reps - 2s hold  [facility] Adult PT Treatment:                                                DATE: 09/03/22 Aquatic therapy at [facility]- [address] - therapeutic pool temp 91 degrees. Pt enters building via W/C and son accompanying. Treatment took place in water 3.8 to 4 ft 8 in.feet deep depending upon activity.  Pt entered and exited the pool via stair and handrails.  Aquatic Exercise: Water walking for warm up and cool down - forwards/backwards/side stepping with blue DB  Sidestepping with shoulder abd/add blue DB x 2 laps Walking march with blue DB x2 laps Water jogging in place with blue DB x5' At edge of pool, pt performed LE exercise Heel raises Hip extension x20 BIL Hip circles CW/CCW x10 each BIL Hip flexion/extension knee straight x20 BIL   Hip abd/add x20 BIL Calf stretch squats Hip flexor and knee flexion stretch   Pt requires the buoyancy of water for active assisted exercises with buoyancy supported for strengthening and AROM exercises. Hydrostatic pressure also supports joints by unweighting joint load by at least 50 % in 3-4 feet depth water. 80% in chest to neck deep water. Water will provide assistance with movement using the current and laminar flow while the buoyancy reduces  weight bearing. Pt requires the viscosity of the water for resistance with strengthening exercises.  [facility] Adult PT Treatment:                                                DATE: 08/26/22 Aquatic therapy at  [facility]- [address] - therapeutic pool temp 91 degrees in lap pool Pt enters building via W/C and son accompanying. Treatment took place in water 3.8 to 4 ft 8 in.feet deep depending upon activity.  Pt entered and exited the pool via stair and handrails.  Aquatic Exercise: Water walking for warm up and cool down - forwards/backwards/side stepping with blue DB  Sidestepping with shoulder abd/add blue DB x 2 laps Walking march with blue DB x2 laps Trunk rotation and straight leg kick with blue DB x20 BIL Water jogging in place with blue DB x5' Step ups on submerged step x10 fwd/lat BIL (mild pain in Rt hip after lateral) At edge of pool, pt performed LE exercise Heel/toe raises - 2x20 Hip extension x20 BIL Hip circles CW/CCW x10 each BIL Hip flexion/extension knee straight x20 BIL   Hip abd/add x20 BIL   Pt requires the buoyancy of water for active assisted exercises with buoyancy supported for strengthening and AROM exercises. Hydrostatic pressure also supports joints by unweighting joint load by at least 50 % in 3-4 feet depth water. 80% in chest to neck deep water. Water will provide assistance with movement using the current and laminar flow while the buoyancy reduces weight bearing. Pt requires the viscosity of the water for resistance with strengthening exercises.  [facility] Adult PT Treatment:                                                DATE: 08/19/22 Therapeutic Exercise: Re-assessment    ASSESSMENT:   CLINICAL IMPRESSION:  Patient presents to aquatic PT session reporting continued knee pain R>L. Session today focused on proximal hip and knee strengthening along with consistent movement for duration of session in the aquatic environment for use of buoyancy to offload  joints and the viscosity of water as resistance during therapeutic exercise.  Pt able to complete listed exercises with good form with min cuing. She continues to be limited by high levels of knee pain with higher load exercies.  Patient was able to tolerate all prescribed exercises in the aquatic environment with no adverse effects. Patient continues to benefit from skilled PT services on land and aquatic based and should be progressed as able to improve functional independence.    OBJECTIVE IMPAIRMENTS: Abnormal gait, decreased activity tolerance, decreased balance, decreased endurance, decreased knowledge of condition, decreased knowledge of use of DME, decreased mobility, difficulty walking, decreased ROM, decreased strength, obesity, and pain.    ACTIVITY LIMITATIONS: carrying, lifting, standing, squatting, stairs, and transfers   PERSONAL FACTORS: Age, Fitness, Past/current experiences, and Time since onset of injury/illness/exacerbation are also affecting patient's functional outcome.    REHAB POTENTIAL: Fair based on chronicity, unsuccessful conservative interventions and advanced OA of B knees   CLINICAL DECISION MAKING: Stable/uncomplicated   EVALUATION COMPLEXITY: Low     GOALS: Goals reviewed with patient? No   SHORT TERM GOALS: Target date: 02/03/2022   Patient to demonstrate independence in HEP  Baseline:[email or web address] Goal status: Ongoing Pt reports non-compliance 08/19/22   2.  Initiate aquatic program Baseline: TBD; 08/19/22 Established Goal status: Met      LONG TERM GOALS: Target date: 09/22/2022     Increase BLE strength to 4-/5 in deficit areas Baseline:  MMT Right eval Left eval R 04/22/22 L 04/22/22 R 06/24/22 L  06/24/22 R 08/19/22 L 08/19/22  Hip flexion  3 3 3+ 3+ 3+ 3+ 3 3  Hip extension            Hip abduction 3 3 3+ 3+ 3+ 3+    Hip adduction            Hip internal rotation            Hip external rotation            Knee flexion 3+ 3+ 3+ 3+ 3+ 3+ 3 3   Knee extension 3+ 3+ 3+ 3+ 3+ 3+ 3 3  Ankle dorsiflexion            Ankle plantarflexion 3+ 3+ UTA UTA      Ankle inversion            Ankle eversion              Goal status: INITIAL   2.  Increase FOTO score to 56 Baseline: 21 Goal status: INITIAL   3.  Increase B knee extension AROM to -10d B and flexion to 115d B Baseline:  Active ROM Right eval Left eval R 04/22/22 L  04/22/22 R 06/01/22 L  06/01/22 R 06/24/22 L 06/24/22 R 08/19/22 L 08/19/22  Hip flexion              Hip extension              Hip abduction              Hip adduction              Hip internal rotation              Hip external rotation              Knee flexion 92d 100d 110d 95d 110d 98d 104d 96d 98d 100d  Knee extension -25d -25 -40/-35d -30/-45d -30d -25d -33d -32d -38 -32  Ankle dorsiflexion              Ankle plantarflexion              Ankle inversion              Ankle eversion               (Blank rows = not tested)  Goal status: Ongoing  4. Increase ambulation distance to 160ft with single cane  Baseline: 66ft with B canes and SBA; 06/01/22 46ft with B canes; 08/19/22 55ft with B canes  Goal Status: Ongoing  5. Increase 30 second chair stand reps to 6 with UE support  Baseline: 4 with UE support; 08/19/22 7 stands  Goal Status: Met       PLAN:   PT FREQUENCY: 2x/week   PT DURATION: 6 weeks   PLANNED INTERVENTIONS: Therapeutic exercises, Therapeutic activity, Neuromuscular re-education, Balance training, Gait training, Patient/Family education, Self Care, Joint mobilization, Stair training, DME instructions, and Re-evaluation, aquatic therapy   PLAN FOR NEXT SESSION: HEP review and update, ROM, strengthening, gait training and functional activities  Fredderick Phenix PT  09/09/22 4:13 PM

## 2022-09-10 ENCOUNTER — Ambulatory Visit: Payer: 59 | Admitting: Family Medicine

## 2022-09-15 DIAGNOSIS — G5602 Carpal tunnel syndrome, left upper limb: Secondary | ICD-10-CM | POA: Diagnosis not present

## 2022-09-16 ENCOUNTER — Encounter: Payer: Self-pay | Admitting: Physical Therapy

## 2022-09-16 ENCOUNTER — Ambulatory Visit: Payer: 59 | Admitting: Physical Therapy

## 2022-09-16 DIAGNOSIS — M6281 Muscle weakness (generalized): Secondary | ICD-10-CM | POA: Diagnosis not present

## 2022-09-16 DIAGNOSIS — M25561 Pain in right knee: Secondary | ICD-10-CM | POA: Diagnosis not present

## 2022-09-16 DIAGNOSIS — M1712 Unilateral primary osteoarthritis, left knee: Secondary | ICD-10-CM | POA: Diagnosis not present

## 2022-09-16 DIAGNOSIS — M25562 Pain in left knee: Secondary | ICD-10-CM | POA: Diagnosis not present

## 2022-09-16 DIAGNOSIS — M1711 Unilateral primary osteoarthritis, right knee: Secondary | ICD-10-CM | POA: Diagnosis not present

## 2022-09-16 DIAGNOSIS — G8929 Other chronic pain: Secondary | ICD-10-CM

## 2022-09-16 NOTE — Therapy (Signed)
OUTPATIENT PHYSICAL THERAPY TREATMENT NOTE  Patient Name: [name]  MRN: [id number] DOB:[birth year], 57 y.o., female Today's Date: 09/17/2022  PCP: Fayette Pho, MD    REFERRING PROVIDER: Otho Darner, MD    PT End of Session - 09/16/22 1529     Visit Number 23    Number of Visits 27    Date for PT Re-Evaluation 09/22/22    Authorization Type [employer]    PT Start Time 0330    PT Stop Time 0411    PT Time Calculation (min) 41 min    Activity Tolerance Patient limited by pain    Behavior During Therapy [facility] for tasks assessed/performed               Past Medical History:  Diagnosis Date   ALLERGIC RHINITIS    takes Zyrtec daily   Anxiety    Arthritis    Asthma    Albuterol prn;Symbicort daily and Singulair daily   Bronchitis    hx of   CAP (community acquired pneumonia) 02/20/2015   Carpal tunnel syndrome    right   Chronic back pain    Constipation    Miralax prn   Depression    takes Cymbalta daily   Diabetes (HCC)    type 2    Eczema    Gallstones 2010   GERD (gastroesophageal reflux disease)    takes Omeprazole daily   Hemorrhoids    Hypertension    takes Prinizide daily and Clonidine on Mondays   Hypoventilation associated with obesity syndrome (HCC)    Insomnia    Irritable bladder    Joint pain    Morbid (severe) obesity due to excess calories (HCC) 04/10/2007   Restrictive changes on pfts 11/30/2007     Myasthenia gravis 1994   Positive Acetylcholine receptor Ab and single fiber EMG Dr. Clar[facility]- Dr. 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  Coralyn Helling- 10/30/2007 ANA positive, ANA titer  negative, RF less than 20   Rheumatoid arthritis(714.0)    Sarcoidosis 12/23/2010   Derm  dx NCG  12/03/10 (path report in EPIC)    Overview:  Overview:  Diagnosed on skin biopsy November 2012  Last Assessment & Plan:  Labs are unremarkable. CXR improved/stable. PFT wnl. I am happy patient had these tests and we have a baseline for her. At this time, I do not see an indication for starting chronic steroids daily. Patient agrees. Her skin lesions are bothering her the most and syst   Shortness of breath    can be sitting as well as exertion   Skin irritation    skin itchy   Trigger finger    Urinary frequency    takes Ditropan daily   Viral meningitis    history of viral meningitis   Past Surgical History:  Procedure Laterality Date   BRONCHOSCOPY  08/2007   CARPAL TUNNEL RELEASE  05/20/2011   Procedure: CARPAL TUNNEL RELEASE;  Surgeon: [name], MD;  Location: [facility] OR;  Service: Orthopedics;  Laterality: Right;  CARPAL TUNNEL RELEASE Right 11/22/2019   Procedure: RIGHT HAND CARPAL TUNNEL RELEASE;  Surgeon: Jones Broom, MD;  Location: [facility] ORS;  Service: Orthopedics;  Laterality: Right;   CARPAL TUNNEL RELEASE Left 02/25/2022   Procedure: CARPAL TUNNEL RELEASE;  Surgeon: Jones Broom, MD;  Location: [facility];  Service: Orthopedics;  Laterality: Left;   CESAREAN SECTION  09/15/2004   COLONOSCOPY N/A 01/21/2014   Procedure: COLONOSCOPY;  Surgeon: Iva Boop, MD;  Location: [facility];  Service: Endoscopy;  Laterality: N/A;   cortisone injection     receives an injection every 3months   ESOPHAGOGASTRODUODENOSCOPY     ESOPHAGOGASTRODUODENOSCOPY N/A 01/21/2014   Procedure: ESOPHAGOGASTRODUODENOSCOPY (EGD);  Surgeon: Iva Boop, MD;  Location: [facility];  Service: Endoscopy;  Laterality: N/A;   LAPAROSCOPIC CHOLECYSTECTOMY  07/2008   by Dr. Cyndia Bent   Thymus resection  08/25/1993   TRIGGER FINGER RELEASE   05/20/2011   Procedure: RELEASE TRIGGER FINGER/A-1 PULLEY;  Surgeon: [name], MD;  Location: [facility] OR;  Service: Orthopedics;  Laterality: Right;  RIGHT TRIGGER THUMB RELEASE AND RIGHT CARPAL TUNNEL RELEASE   Patient Active Problem List   Diagnosis Date Noted   Weight loss advised 03/07/2022   Drug-induced constipation 12/09/2021   Anxiety 09/23/2021   Chronic pain of both shoulders 03/24/2021   Left wrist pain 03/24/2021   Diabetic neuropathy (HCC) 02/25/2021   Healthcare maintenance 10/21/2020   Hyperlipidemia (primary prevention, LDL goal <70) 09/04/2020   Wheelchair dependence 05/05/2020   Carpal tunnel syndrome of right wrist, RECURRENT 10/15/2019   Insomnia, idiopathic 09/30/2018   Grade I diastolic dysfunction 09/30/2018   Encounter for chronic pain management 02/11/2014   Diabetes (HCC) 02/11/2014   Cystocele 09/14/2012   Chronic pain of both knees 05/28/2010   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   Allergic rhinitis 05/30/2008   Carpal tunnel syndrome of left wrist 01/17/2008   OBESITY HYPOVENTILATION SYNDROME 12/27/2007   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  She has been told that she has arthritis in both knees.  She was seen at [facility] orthopedics recently and told she needed to work on strengthening her legs and they have actually ordered therapy.  They also discussed with her that she needed to  work on weight loss which she is doing.  She states she has had bilateral knee pain for some 30 years.  No known injury.  Radiographs in 2019 are available and show severe tricompartmental arthritis of the right knee.  She does take diclofenac Tylenol Lyrica and oxycodone for her knee pain but states this really helped.  She is also tried injections both cortisone and gel injections without any real relief.  She notes that she has been basically dependent upon a motorized wheelchair for the last 2 years.  She does ambulate short distances though.  She states prior to the motorized wheelchair she was walking with a walker.  She is lost from 430 pounds down to 316 pounds.  States due to her myasthenia gravis  she has been on prednisone.  Has gained a lot of weight over the years.  She has also had weakness in the extremities for years.  Reports that her diabetes is under good control at this point in time.  PRECAUTIONS/RESTRICTIONS:   FALL  SUBJECTIVE: Pt reports severe R knee pain @9 /10 on L today.   PAIN:  Are you having pain? Yes: NPRS scale: 7/10 (R>L) Pain location: B knees Pain description: ache Aggravating factors: everything Relieving factors: nothing  OBJECTIVE: (objective measures completed at initial evaluation unless otherwise dated)  DIAGNOSTIC FINDINGS: CLINICAL DATA:  57 year old female with a history of chronic right knee pain   EXAM: RIGHT KNEE 3 VIEWS   COMPARISON:  08/03/2013   FINDINGS: No acute displaced fracture. Advanced joint space narrowing of the mediolateral compartment with marginal osteophyte formation, sclerotic changes. Degenerative changes of the patellofemoral joint. Changes have not progressed since the comparison of 08/03/2013   IMPRESSION: Advanced tricompartmental osteoarthritis, worse than the comparison study of 2015.   No acute bony abnormality identified.     Electronically Signed   By: Gilmer Mor D.O.   On: 11/09/2017 09:34      PATIENT SURVEYS:  FOTO 21(56 predicted)    COGNITION: Overall cognitive status: Within functional limits for tasks assessed                         SENSATION: Not tested   MUSCLE LENGTH: UTA due to pain levels   POSTURE:  Flexed knee/hip posture   PALPATION: deferred   LOWER EXTREMITY ROM:   Active ROM Right eval Left eval R 04/22/22 L  04/22/22 R 06/01/22 L  06/01/22 R 06/24/22 L 06/24/22 R 08/19/22 L 08/19/22  Hip flexion              Hip extension              Hip abduction              Hip adduction              Hip internal rotation              Hip external rotation              Knee flexion 92d 100d 110d 95d 110d 98d 104d 96d 98d 100d  Knee extension -25d -25 -40/-35d -30/-45d -30d -25d -33d -32d -38 -32  Ankle dorsiflexion              Ankle plantarflexion              Ankle inversion              Ankle eversion               (Blank rows = not tested)   LOWER EXTREMITY MMT:   MMT Right eval Left eval R 04/22/22 L 04/22/22 R 06/24/22 L  06/24/22 R 08/19/22 L 08/19/22  Hip flexion 3 3 3+ 3+ 3+ 3+ 3 3  Hip extension            Hip abduction 3 3 3+ 3+ 3+ 3+    Hip adduction            Hip internal rotation            Hip external rotation            Knee flexion 3+ 3+ 3+ 3+ 3+ 3+ 3 3  Knee extension 3+ 3+  3+ 3+ 3+ 3+ 3 3  Ankle dorsiflexion            Ankle plantarflexion 3+ 3+ UTA UTA      Ankle inversion            Ankle eversion             PF strength testing deferred due to B achilles pain.   LOWER EXTREMITY SPECIAL TESTS:  UTA due to pain and limited tolerance to testing positions    FUNCTIONAL TESTS:  30 seconds chair stand test 0 reps 04/22/22 30s chair stand test 4 reps with UE support 06/24/22 30s chair stand 7 reps with UE support 08/19/22 30s chair stand 7 reps with UE support   GAIT: Distance walked: 61ft Assistive device utilized: Wheelchair (power) Level of assistance: Complete Independence Comments: Patient using power chair for  mobility 04/22/22 21ft with SBA and B canes 08/20/22 31ft with B canes 2 MWT      PATIENT EDUCATION:  Education details: Discussed eval findings, rehab rationale and POC and patient is in agreement  Person educated: Patient Education method: Explanation Education comprehension: verbalized understanding and needs further education   HOME EXERCISE PROGRAM: AAccess Code: [email or web address] URL: [email or web address] Date: 04/22/2022 Prepared by: Gustavus Bryant  Exercises - Seated Long Arc Quad  - 3 x daily - 5 x weekly - 2 sets - 10 reps - Seated Hamstring Stretch  - 3 x daily - 5 x weekly - 1 sets - 2 reps - 30s hold - Seated Heel Slide  - 3 x daily - 5 x weekly - 2 sets - 10 reps - 2s hold  [facility] Adult PT Treatment:                                                DATE: 09/16/22 Aquatic therapy at [facility]- [address] - therapeutic pool temp 91 degrees. Pt enters building via W/C and son accompanying. Treatment took place in water 3.8 to 4 ft 8 in.feet deep depending upon activity.  Pt entered and exited the pool via stair and handrails.  Aquatic Exercise: Water walking for warm up and cool down - forwards/backwards/side stepping with blue DB  Sidestepping with shoulder abd/add blue DB x 2 laps Walking march with blue DB x2 laps Water jogging in place with blue DB x5' At edge of pool, pt performed LE exercise Heel raises Hip extension x20 BIL Hip circles CW/CCW x10 each BIL Hip flexion/extension knee straight x20 BIL   Hip abd/add x20 BIL Calf stretch squats Hip flexor and knee flexion stretch   Pt requires the buoyancy of water for active assisted exercises with buoyancy supported for strengthening and AROM exercises. Hydrostatic pressure also supports joints by unweighting joint load by at least 50 % in 3-4 feet depth water. 80% in chest to neck deep water. Water will provide assistance with movement using the current and laminar flow while the buoyancy reduces  weight bearing. Pt requires the viscosity of the water for resistance with strengthening exercises.  [facility] Adult PT Treatment:                                                DATE: 08/26/22 Aquatic therapy at  [facility]- [address] - therapeutic pool temp 91 degrees in lap pool Pt enters building via W/C and son accompanying. Treatment took place in water 3.8 to 4 ft 8 in.feet deep depending upon activity.  Pt entered and exited the pool via stair and handrails.  Aquatic Exercise: Water walking for warm up and cool down - forwards/backwards/side stepping with blue DB  Sidestepping with shoulder abd/add blue DB x 2 laps Walking march with blue DB x2 laps Trunk rotation and straight leg kick with blue DB x20 BIL Water jogging in place with blue DB x5' Step ups on submerged step x10 fwd/lat BIL (mild pain in Rt hip after lateral) At edge of pool, pt performed LE exercise Heel/toe raises - 2x20 Hip extension x20 BIL Hip circles CW/CCW x10 each BIL Hip flexion/extension knee straight x20 BIL   Hip abd/add x20 BIL   Pt requires the buoyancy of water for active assisted exercises with buoyancy supported for strengthening and AROM exercises. Hydrostatic pressure also supports joints by unweighting joint load by at least 50 % in 3-4 feet depth water. 80% in chest to neck deep water. Water will provide assistance with movement using the current and laminar flow while the buoyancy reduces weight bearing. Pt requires the viscosity of the water for resistance with strengthening exercises.  [facility] Adult PT Treatment:                                                DATE: 08/19/22 Therapeutic Exercise: Re-assessment    ASSESSMENT:   CLINICAL IMPRESSION:  Patient presents to aquatic PT session reporting continued knee pain R>L. Session today focused on hip and knee strengthening along with consistent movement for duration of session in the aquatic environment for use of buoyancy to offload joints and  the viscosity of water as resistance during therapeutic exercise.  Pt does show improved mobility in the pool vs land but has continued difficulty with knee flexion even in a gravity reduced environment.  I recommended she look into regular exercise at a local pool that takes silver sneakers; she is agreeable.  Patient was able to tolerate all prescribed exercises in the aquatic environment with no adverse effects. Patient continues to benefit from skilled PT services on land and aquatic based and should be progressed as able to improve functional independence.    OBJECTIVE IMPAIRMENTS: Abnormal gait, decreased activity tolerance, decreased balance, decreased endurance, decreased knowledge of condition, decreased knowledge of use of DME, decreased mobility, difficulty walking, decreased ROM, decreased strength, obesity, and pain.    ACTIVITY LIMITATIONS: carrying, lifting, standing, squatting, stairs, and transfers   PERSONAL FACTORS: Age, Fitness, Past/current experiences, and Time since onset of injury/illness/exacerbation are also affecting patient's functional outcome.    REHAB POTENTIAL: Fair based on chronicity, unsuccessful conservative interventions and advanced OA of B knees   CLINICAL DECISION MAKING: Stable/uncomplicated   EVALUATION COMPLEXITY: Low     GOALS: Goals reviewed with patient? No   SHORT TERM GOALS: Target date: 02/03/2022   Patient to demonstrate independence in HEP  Baseline:[email or web address] Goal status: Ongoing Pt reports non-compliance 08/19/22   2.  Initiate aquatic program Baseline: TBD; 08/19/22 Established Goal status: Met      LONG TERM GOALS: Target date: 09/22/2022     Increase BLE strength to 4-/5 in deficit areas Baseline:  MMT Right eval Left eval R 04/22/22  L 04/22/22 R 06/24/22 L  06/24/22 R 08/19/22 L 08/19/22  Hip flexion 3 3 3+ 3+ 3+ 3+ 3 3  Hip extension            Hip abduction 3 3 3+ 3+ 3+ 3+    Hip adduction            Hip internal  rotation            Hip external rotation            Knee flexion 3+ 3+ 3+ 3+ 3+ 3+ 3 3  Knee extension 3+ 3+ 3+ 3+ 3+ 3+ 3 3  Ankle dorsiflexion            Ankle plantarflexion 3+ 3+ UTA UTA      Ankle inversion            Ankle eversion              Goal status: INITIAL   2.  Increase FOTO score to 56 Baseline: 21 Goal status: INITIAL   3.  Increase B knee extension AROM to -10d B and flexion to 115d B Baseline:  Active ROM Right eval Left eval R 04/22/22 L  04/22/22 R 06/01/22 L  06/01/22 R 06/24/22 L 06/24/22 R 08/19/22 L 08/19/22  Hip flexion              Hip extension              Hip abduction              Hip adduction              Hip internal rotation              Hip external rotation              Knee flexion 92d 100d 110d 95d 110d 98d 104d 96d 98d 100d  Knee extension -25d -25 -40/-35d -30/-45d -30d -25d -33d -32d -38 -32  Ankle dorsiflexion              Ankle plantarflexion              Ankle inversion              Ankle eversion               (Blank rows = not tested)  Goal status: Ongoing  4. Increase ambulation distance to 123ft with single cane  Baseline: 59ft with B canes and SBA; 06/01/22 34ft with B canes; 08/19/22 63ft with B canes  Goal Status: Ongoing  5. Increase 30 second chair stand reps to 6 with UE support  Baseline: 4 with UE support; 08/19/22 7 stands  Goal Status: Met       PLAN:   PT FREQUENCY: 2x/week   PT DURATION: 6 weeks   PLANNED INTERVENTIONS: Therapeutic exercises, Therapeutic activity, Neuromuscular re-education, Balance training, Gait training, Patient/Family education, Self Care, Joint mobilization, Stair training, DME instructions, and Re-evaluation, aquatic therapy   PLAN FOR NEXT SESSION: HEP review and update, ROM, strengthening, gait training and functional activities  Fredderick Phenix PT  09/17/22 7:03 AM

## 2022-09-22 NOTE — Therapy (Unsigned)
OUTPATIENT PHYSICAL THERAPY TREATMENT NOTE  Patient Name: [name]  MRN: [id number] DOB:[birth year], 57 y.o., female Today's Date: 09/22/2022  PCP: Fayette Pho, MD    REFERRING PROVIDER: Otho Darner, MD        Past Medical History:  Diagnosis Date   ALLERGIC RHINITIS    takes Zyrtec daily   Anxiety    Arthritis    Asthma    Albuterol prn;Symbicort daily and Singulair daily   Bronchitis    hx of   CAP (community acquired pneumonia) 02/20/2015   Carpal tunnel syndrome    right   Chronic back pain    Constipation    Miralax prn   Depression    takes Cymbalta daily   Diabetes (HCC)    type 2    Eczema    Gallstones 2010   GERD (gastroesophageal reflux disease)    takes Omeprazole daily   Hemorrhoids    Hypertension    takes Prinizide daily and Clonidine on Mondays   Hypoventilation associated with obesity syndrome (HCC)    Insomnia    Irritable bladder    Joint pain    Morbid (severe) obesity due to excess calories (HCC) 04/10/2007   Restrictive changes on pfts 11/30/2007     Myasthenia gravis 1994   Positive Acetylcholine receptor Ab and single fiber EMG Dr. [facility]- Dr. 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 CXR improved/stable. PFT wnl. I am happy patient had these  tests and we have a baseline for her. At this time, I do not see an indication for starting chronic steroids daily. Patient agrees. Her skin lesions are bothering her the most and syst   Shortness of breath    can be sitting as well as exertion   Skin irritation    skin itchy   Trigger finger    Urinary frequency    takes Ditropan daily   Viral meningitis    history of viral meningitis   Past Surgical History:  Procedure Laterality Date   BRONCHOSCOPY  08/2007   CARPAL TUNNEL RELEASE  05/20/2011   Procedure: CARPAL TUNNEL RELEASE;  Surgeon: [name], MD;  Location: [facility] OR;  Service: Orthopedics;  Laterality: Right;   CARPAL TUNNEL RELEASE Right 11/22/2019   Procedure: RIGHT HAND CARPAL TUNNEL RELEASE;  Surgeon: Jones Broom, MD;  Location: [facility]S;  Service: Orthopedics;  Laterality: Right;   CARPAL TUNNEL RELEASE Left 02/25/2022   Procedure: CARPAL TUNNEL RELEASE;  Surgeon: Jones Broom, MD;  Location: [facility];  Service: Orthopedics;  Laterality: Left;   CESAREAN SECTION  09/15/2004   COLONOSCOPY N/A 01/21/2014   Procedure: COLONOSCOPY;  Surgeon: Iva Boop, MD;  Location: [facility];  Service: Endoscopy;  Laterality:  N/A;   cortisone injection     receives an injection every 3months   ESOPHAGOGASTRODUODENOSCOPY     ESOPHAGOGASTRODUODENOSCOPY N/A 01/21/2014   Procedure: ESOPHAGOGASTRODUODENOSCOPY (EGD);  Surgeon: Iva Boop, MD;  Location: [facility];  Service: Endoscopy;  Laterality: N/A;   LAPAROSCOPIC CHOLECYSTECTOMY  07/2008   by Dr. Cyndia Bent   Thymus resection  08/25/1993   TRIGGER FINGER RELEASE  05/20/2011   Procedure: RELEASE TRIGGER FINGER/A-1 PULLEY;  Surgeon: [name], MD;  Location: [facility] OR;  Service: Orthopedics;  Laterality: Right;  RIGHT TRIGGER THUMB RELEASE AND RIGHT CARPAL TUNNEL RELEASE   Patient Active Problem List   Diagnosis Date Noted   Weight loss advised 03/07/2022   Drug-induced constipation 12/09/2021    Anxiety 09/23/2021   Chronic pain of both shoulders 03/24/2021   Left wrist pain 03/24/2021   Diabetic neuropathy (HCC) 02/25/2021   Healthcare maintenance 10/21/2020   Hyperlipidemia (primary prevention, LDL goal <70) 09/04/2020   Wheelchair dependence 05/05/2020   Carpal tunnel syndrome of right wrist, RECURRENT 10/15/2019   Insomnia, idiopathic 09/30/2018   Grade I diastolic dysfunction 09/30/2018   Encounter for chronic pain management 02/11/2014   Diabetes (HCC) 02/11/2014   Cystocele 09/14/2012   Chronic pain of both knees 05/28/2010   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   Allergic rhinitis 05/30/2008   Carpal tunnel syndrome of left wrist 01/17/2008   OBESITY HYPOVENTILATION SYNDROME 12/27/2007   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  She has been told that she has arthritis in both knees.  She was seen at [facility]opedics recently and told she needed to work on strengthening her legs and they have actually ordered therapy.  They also discussed with her that she needed to work on weight loss which she is doing.  She states she has had bilateral knee pain for some 30 years.  No known injury.  Radiographs in 2019 are available and show severe tricompartmental arthritis of the right knee.  She does take diclofenac Tylenol Lyrica and oxycodone for her knee pain but states this really helped.  She is also tried injections  both cortisone and gel injections without any real relief.  She notes that she has been basically dependent upon a motorized wheelchair for the last 2 years.  She does ambulate short distances though.  She states prior to the motorized wheelchair she was walking with a walker.  She is lost from 430 pounds down to 316 pounds.  States due to her myasthenia gravis she has been on prednisone.  Has gained a lot of weight over the years.  She has also had weakness in the extremities for years.  Reports that her diabetes is under good control at this point in time.  PRECAUTIONS/RESTRICTIONS:   FALL  SUBJECTIVE: Pt reports severe R knee pain @9 /10 on L today.   PAIN:  Are you having pain? Yes: NPRS scale: 7/10 (R>L) Pain location: B knees Pain description: ache Aggravating factors: everything Relieving factors: nothing  OBJECTIVE: (objective  measures completed at initial evaluation unless otherwise dated)  DIAGNOSTIC FINDINGS: CLINICAL DATA:  57 year old female with a history of chronic right knee pain   EXAM: RIGHT KNEE 3 VIEWS   COMPARISON:  08/03/2013   FINDINGS: No acute displaced fracture. Advanced joint space narrowing of the mediolateral compartment with marginal osteophyte formation, sclerotic changes. Degenerative changes of the patellofemoral joint. Changes have not progressed since the comparison of 08/03/2013   IMPRESSION: Advanced tricompartmental osteoarthritis, worse than the comparison study of 2015.   No acute bony abnormality identified.     Electronically Signed   By: Gilmer Mor D.O.   On: 11/09/2017 09:34     PATIENT SURVEYS:  FOTO 21(56 predicted)    COGNITION: Overall cognitive status: Within functional limits for tasks assessed                         SENSATION: Not tested   MUSCLE LENGTH: UTA due to pain levels   POSTURE:  Flexed knee/hip posture   PALPATION: deferred   LOWER EXTREMITY ROM:   Active ROM Right eval Left eval  R 04/22/22 L  04/22/22 R 06/01/22 L  06/01/22 R 06/24/22 L 06/24/22 R 08/19/22 L 08/19/22  Hip flexion              Hip extension              Hip abduction              Hip adduction              Hip internal rotation              Hip external rotation              Knee flexion 92d 100d 110d 95d 110d 98d 104d 96d 98d 100d  Knee extension -25d -25 -40/-35d -30/-45d -30d -25d -33d -32d -38 -32  Ankle dorsiflexion              Ankle plantarflexion              Ankle inversion              Ankle eversion               (Blank rows = not tested)   LOWER EXTREMITY MMT:   MMT Right eval Left eval R 04/22/22 L 04/22/22 R 06/24/22 L  06/24/22 R 08/19/22 L 08/19/22  Hip flexion 3 3 3+ 3+ 3+ 3+ 3 3  Hip extension            Hip abduction 3 3 3+ 3+ 3+ 3+    Hip adduction            Hip internal rotation            Hip external rotation            Knee flexion 3+ 3+ 3+ 3+ 3+ 3+ 3 3  Knee extension 3+ 3+ 3+ 3+ 3+ 3+ 3 3  Ankle dorsiflexion            Ankle plantarflexion 3+ 3+ UTA UTA      Ankle inversion            Ankle eversion             PF strength testing deferred due to B achilles pain.   LOWER EXTREMITY SPECIAL TESTS:  UTA due to pain and limited tolerance to testing positions  FUNCTIONAL TESTS:  30 seconds chair stand test 0 reps 04/22/22 30s chair stand test 4 reps with UE support 06/24/22 30s chair stand 7 reps with UE support 08/19/22 30s chair stand 7 reps with UE support   GAIT: Distance walked: 51ft Assistive device utilized: Wheelchair (power) Level of assistance: Complete Independence Comments: Patient using power chair for mobility 04/22/22 48ft with SBA and B canes 08/20/22 16ft with B canes 2 MWT      PATIENT EDUCATION:  Education details: Discussed eval findings, rehab rationale and POC and patient is in agreement  Person educated: Patient Education method: Explanation Education comprehension: verbalized understanding and needs further education   HOME  EXERCISE PROGRAM: AAccess Code: [email or web address] URL: [email or web address] Date: 04/22/2022 Prepared by: Gustavus Bryant  Exercises - Seated Long Arc Quad  - 3 x daily - 5 x weekly - 2 sets - 10 reps - Seated Hamstring Stretch  - 3 x daily - 5 x weekly - 1 sets - 2 reps - 30s hold - Seated Heel Slide  - 3 x daily - 5 x weekly - 2 sets - 10 reps - 2s hold  [facility] Adult PT Treatment:                                                DATE: 09/16/22 Aquatic therapy at [facility] [address] - therapeutic pool temp 91 degrees. Pt enters building via W/C and son accompanying. Treatment took place in water 3.8 to 4 ft 8 in.feet deep depending upon activity.  Pt entered and exited the pool via stair and handrails.  Aquatic Exercise: Water walking for warm up and cool down - forwards/backwards/side stepping with blue DB  Sidestepping with shoulder abd/add blue DB x 2 laps Walking march with blue DB x2 laps Water jogging in place with blue DB x5' At edge of pool, pt performed LE exercise Heel raises Hip extension x20 BIL Hip circles CW/CCW x10 each BIL Hip flexion/extension knee straight x20 BIL   Hip abd/add x20 BIL Calf stretch squats Hip flexor and knee flexion stretch   Pt requires the buoyancy of water for active assisted exercises with buoyancy supported for strengthening and AROM exercises. Hydrostatic pressure also supports joints by unweighting joint load by at least 50 % in 3-4 feet depth water. 80% in chest to neck deep water. Water will provide assistance with movement using the current and laminar flow while the buoyancy reduces weight bearing. Pt requires the viscosity of the water for resistance with strengthening exercises.  [facility] Adult PT Treatment:                                                DATE: 08/26/22 Aquatic therapy at [facility] [address] - therapeutic pool temp 91 degrees in lap pool Pt enters building via W/C and son accompanying. Treatment took  place in water 3.8 to 4 ft 8 in.feet deep depending upon activity.  Pt entered and exited the pool via stair and handrails.  Aquatic Exercise: Water walking for warm up and cool down - forwards/backwards/side stepping with blue DB  Sidestepping with shoulder abd/add blue DB x 2 laps Walking march with blue DB x2 laps Trunk rotation and straight leg kick  with blue DB x20 BIL Water jogging in place with blue DB x5' Step ups on submerged step x10 fwd/lat BIL (mild pain in Rt hip after lateral) At edge of pool, pt performed LE exercise Heel/toe raises - 2x20 Hip extension x20 BIL Hip circles CW/CCW x10 each BIL Hip flexion/extension knee straight x20 BIL   Hip abd/add x20 BIL   Pt requires the buoyancy of water for active assisted exercises with buoyancy supported for strengthening and AROM exercises. Hydrostatic pressure also supports joints by unweighting joint load by at least 50 % in 3-4 feet depth water. 80% in chest to neck deep water. Water will provide assistance with movement using the current and laminar flow while the buoyancy reduces weight bearing. Pt requires the viscosity of the water for resistance with strengthening exercises.  [facility] Adult PT Treatment:                                                DATE: 08/19/22 Therapeutic Exercise: Re-assessment    ASSESSMENT:   CLINICAL IMPRESSION:  Patient presents to aquatic PT session reporting continued knee pain R>L. Session today focused on hip and knee strengthening along with consistent movement for duration of session in the aquatic environment for use of buoyancy to offload joints and the viscosity of water as resistance during therapeutic exercise.  Pt does show improved mobility in the pool vs land but has continued difficulty with knee flexion even in a gravity reduced environment.  I recommended she look into regular exercise at a local pool that takes silver sneakers; she is agreeable.  Patient was able to tolerate all  prescribed exercises in the aquatic environment with no adverse effects. Patient continues to benefit from skilled PT services on land and aquatic based and should be progressed as able to improve functional independence.    OBJECTIVE IMPAIRMENTS: Abnormal gait, decreased activity tolerance, decreased balance, decreased endurance, decreased knowledge of condition, decreased knowledge of use of DME, decreased mobility, difficulty walking, decreased ROM, decreased strength, obesity, and pain.    ACTIVITY LIMITATIONS: carrying, lifting, standing, squatting, stairs, and transfers   PERSONAL FACTORS: Age, Fitness, Past/current experiences, and Time since onset of injury/illness/exacerbation are also affecting patient's functional outcome.    REHAB POTENTIAL: Fair based on chronicity, unsuccessful conservative interventions and advanced OA of B knees   CLINICAL DECISION MAKING: Stable/uncomplicated   EVALUATION COMPLEXITY: Low     GOALS: Goals reviewed with patient? No   SHORT TERM GOALS: Target date: 02/03/2022   Patient to demonstrate independence in HEP  Baseline:[email or web address] Goal status: Ongoing Pt reports non-compliance 08/19/22   2.  Initiate aquatic program Baseline: TBD; 08/19/22 Established Goal status: Met      LONG TERM GOALS: Target date: 09/22/2022     Increase BLE strength to 4-/5 in deficit areas Baseline:  MMT Right eval Left eval R 04/22/22 L 04/22/22 R 06/24/22 L  06/24/22 R 08/19/22 L 08/19/22  Hip flexion 3 3 3+ 3+ 3+ 3+ 3 3  Hip extension            Hip abduction 3 3 3+ 3+ 3+ 3+    Hip adduction            Hip internal rotation            Hip external rotation  Knee flexion 3+ 3+ 3+ 3+ 3+ 3+ 3 3  Knee extension 3+ 3+ 3+ 3+ 3+ 3+ 3 3  Ankle dorsiflexion            Ankle plantarflexion 3+ 3+ UTA UTA      Ankle inversion            Ankle eversion              Goal status: INITIAL   2.  Increase FOTO score to 56 Baseline: 21 Goal status:  INITIAL   3.  Increase B knee extension AROM to -10d B and flexion to 115d B Baseline:  Active ROM Right eval Left eval R 04/22/22 L  04/22/22 R 06/01/22 L  06/01/22 R 06/24/22 L 06/24/22 R 08/19/22 L 08/19/22  Hip flexion              Hip extension              Hip abduction              Hip adduction              Hip internal rotation              Hip external rotation              Knee flexion 92d 100d 110d 95d 110d 98d 104d 96d 98d 100d  Knee extension -25d -25 -40/-35d -30/-45d -30d -25d -33d -32d -38 -32  Ankle dorsiflexion              Ankle plantarflexion              Ankle inversion              Ankle eversion               (Blank rows = not tested)  Goal status: Ongoing  4. Increase ambulation distance to 139ft with single cane  Baseline: 68ft with B canes and SBA; 06/01/22 52ft with B canes; 08/19/22 28ft with B canes  Goal Status: Ongoing  5. Increase 30 second chair stand reps to 6 with UE support  Baseline: 4 with UE support; 08/19/22 7 stands  Goal Status: Met       PLAN:   PT FREQUENCY: 2x/week   PT DURATION: 6 weeks   PLANNED INTERVENTIONS: Therapeutic exercises, Therapeutic activity, Neuromuscular re-education, Balance training, Gait training, Patient/Family education, Self Care, Joint mobilization, Stair training, DME instructions, and Re-evaluation, aquatic therapy   PLAN FOR NEXT SESSION: HEP review and update, ROM, strengthening, gait training and functional activities  Hildred Laser PT  09/22/22 9:51 AM

## 2022-09-23 ENCOUNTER — Ambulatory Visit: Payer: 59

## 2022-09-23 DIAGNOSIS — M25562 Pain in left knee: Secondary | ICD-10-CM | POA: Diagnosis not present

## 2022-09-23 DIAGNOSIS — M6281 Muscle weakness (generalized): Secondary | ICD-10-CM | POA: Diagnosis not present

## 2022-09-23 DIAGNOSIS — G8929 Other chronic pain: Secondary | ICD-10-CM | POA: Diagnosis not present

## 2022-09-23 DIAGNOSIS — M25561 Pain in right knee: Secondary | ICD-10-CM | POA: Diagnosis not present

## 2022-10-05 ENCOUNTER — Other Ambulatory Visit (HOSPITAL_COMMUNITY): Payer: Self-pay

## 2022-10-05 ENCOUNTER — Other Ambulatory Visit: Payer: Self-pay | Admitting: Family Medicine

## 2022-10-05 ENCOUNTER — Encounter: Payer: Self-pay | Admitting: Family Medicine

## 2022-10-05 DIAGNOSIS — M174 Other bilateral secondary osteoarthritis of knee: Secondary | ICD-10-CM

## 2022-10-05 DIAGNOSIS — E0810 Diabetes mellitus due to underlying condition with ketoacidosis without coma: Secondary | ICD-10-CM

## 2022-10-05 DIAGNOSIS — G8929 Other chronic pain: Secondary | ICD-10-CM

## 2022-10-05 DIAGNOSIS — D869 Sarcoidosis, unspecified: Secondary | ICD-10-CM

## 2022-10-05 DIAGNOSIS — I10 Essential (primary) hypertension: Secondary | ICD-10-CM

## 2022-10-06 ENCOUNTER — Other Ambulatory Visit (HOSPITAL_COMMUNITY): Payer: Self-pay

## 2022-10-06 ENCOUNTER — Other Ambulatory Visit: Payer: Self-pay

## 2022-10-06 MED ORDER — DICLOFENAC SODIUM 50 MG PO TBEC
50.0000 mg | DELAYED_RELEASE_TABLET | Freq: Two times a day (BID) | ORAL | 0 refills | Status: DC | PRN
Start: 2022-10-06 — End: 2023-01-27
  Filled 2022-10-06: qty 60, 30d supply, fill #0

## 2022-10-06 MED ORDER — MOUNJARO 12.5 MG/0.5ML ~~LOC~~ SOAJ
12.5000 mg | SUBCUTANEOUS | 0 refills | Status: DC
Start: 2022-10-06 — End: 2022-11-05
  Filled 2022-10-06: qty 2, 28d supply, fill #0
  Filled 2022-10-31: qty 2, 28d supply, fill #1

## 2022-10-07 ENCOUNTER — Other Ambulatory Visit: Payer: Self-pay

## 2022-10-07 ENCOUNTER — Other Ambulatory Visit (HOSPITAL_COMMUNITY): Payer: Self-pay

## 2022-10-07 MED ORDER — OMEPRAZOLE 40 MG PO CPDR
40.0000 mg | DELAYED_RELEASE_CAPSULE | Freq: Every day | ORAL | 3 refills | Status: DC
  Filled 2022-10-07: qty 90, 90d supply, fill #0
  Filled 2023-01-27: qty 90, 90d supply, fill #1
  Filled 2023-04-27: qty 90, 90d supply, fill #2
  Filled 2023-08-10: qty 90, 90d supply, fill #3

## 2022-10-07 MED ORDER — CETIRIZINE HCL 10 MG PO TABS
10.0000 mg | ORAL_TABLET | Freq: Every day | ORAL | 3 refills | Status: DC
  Filled 2022-10-07: qty 90, 90d supply, fill #0
  Filled 2023-01-27: qty 90, 90d supply, fill #1
  Filled 2023-04-27: qty 90, 90d supply, fill #2
  Filled 2023-06-28 – 2023-08-10 (×2): qty 90, 90d supply, fill #3

## 2022-10-07 MED ORDER — HYDROCHLOROTHIAZIDE 25 MG PO TABS
25.0000 mg | ORAL_TABLET | Freq: Every day | ORAL | 3 refills | Status: DC
Start: 2022-10-07 — End: 2023-09-23
  Filled 2022-10-07: qty 90, 90d supply, fill #0
  Filled 2023-01-27: qty 90, 90d supply, fill #1
  Filled 2023-04-27: qty 90, 90d supply, fill #2
  Filled 2023-08-10: qty 90, 90d supply, fill #3

## 2022-10-07 MED ORDER — BUPROPION HCL ER (XL) 300 MG PO TB24
300.0000 mg | ORAL_TABLET | Freq: Every day | ORAL | 3 refills | Status: DC
  Filled 2022-10-07: qty 90, 90d supply, fill #0
  Filled 2023-01-27: qty 90, 90d supply, fill #1
  Filled 2023-04-27: qty 90, 90d supply, fill #2
  Filled 2023-08-10: qty 90, 90d supply, fill #3

## 2022-10-07 MED ORDER — MONTELUKAST SODIUM 10 MG PO TABS
10.0000 mg | ORAL_TABLET | Freq: Every day | ORAL | 3 refills | Status: DC
  Filled 2022-10-07: qty 90, 90d supply, fill #0
  Filled 2023-01-27: qty 90, 90d supply, fill #1
  Filled 2023-04-27: qty 90, 90d supply, fill #2
  Filled 2023-08-10: qty 90, 90d supply, fill #3

## 2022-10-11 DIAGNOSIS — R200 Anesthesia of skin: Secondary | ICD-10-CM | POA: Diagnosis not present

## 2022-10-13 DIAGNOSIS — G5602 Carpal tunnel syndrome, left upper limb: Secondary | ICD-10-CM | POA: Diagnosis not present

## 2022-10-20 DIAGNOSIS — G5602 Carpal tunnel syndrome, left upper limb: Secondary | ICD-10-CM | POA: Diagnosis not present

## 2022-10-21 ENCOUNTER — Other Ambulatory Visit (HOSPITAL_COMMUNITY): Payer: Self-pay

## 2022-10-21 ENCOUNTER — Other Ambulatory Visit: Payer: Self-pay

## 2022-10-21 DIAGNOSIS — G5602 Carpal tunnel syndrome, left upper limb: Secondary | ICD-10-CM | POA: Diagnosis not present

## 2022-10-22 ENCOUNTER — Ambulatory Visit: Payer: 59

## 2022-10-27 ENCOUNTER — Other Ambulatory Visit: Payer: Self-pay | Admitting: Orthopedic Surgery

## 2022-10-29 ENCOUNTER — Other Ambulatory Visit: Payer: Self-pay

## 2022-10-29 ENCOUNTER — Encounter: Payer: Self-pay | Admitting: Family Medicine

## 2022-10-29 ENCOUNTER — Ambulatory Visit (INDEPENDENT_AMBULATORY_CARE_PROVIDER_SITE_OTHER): Payer: 59 | Admitting: Family Medicine

## 2022-10-29 VITALS — BP 132/67 | Ht 63.0 in | Wt 345.0 lb

## 2022-10-29 DIAGNOSIS — G8929 Other chronic pain: Secondary | ICD-10-CM

## 2022-10-29 DIAGNOSIS — M25512 Pain in left shoulder: Secondary | ICD-10-CM | POA: Diagnosis not present

## 2022-10-29 DIAGNOSIS — M25511 Pain in right shoulder: Secondary | ICD-10-CM | POA: Diagnosis not present

## 2022-10-29 NOTE — Assessment & Plan Note (Addendum)
-   Ultrasound showing some rotator cuff tendinopathy.  Bupivacaine injection as below.  Steroid injection deferred due to upcoming procedure. - We will send in a referral for physical therapy. - Right shoulder pain suspicious for cervical radiculopathy.  Cervical x-rays pending - We will see her in 2 months for reevaluation.  Can consider steroid injection at that point.  Procedure: Subacromial injection:  After informed written consent timeout was performed, patient was positioned sitting upright in exam chair. The left shoulder was prepped with alcohol swab and utilizing posterior approach, patient's left subacromial space was injected with 5cc of bupivocaine. Patient tolerated the procedure well without immediate complications.

## 2022-10-29 NOTE — Progress Notes (Unsigned)
[name] - 57 y.o. female MRN [id number]  Date of birth: 09/14/1965  PCP: Vonna Drafts, MD  Subjective:  No chief complaint on file. Bilateral shoulder pain  HPI: Past Medical, Surgical, Social, and Family History Reviewed & Updated per EMR.   Patient is a 57 y.o. female here for pain located in both shoulders, left greater than right that started years ago and has gradually increased.  The left shoulder hurts with flexion and abduction and the right shoulder has some shooting pain from her neck down the arm. It is exacerbated by movement.  The patient has tried no therapies yet.  She does have the left carpal tunnel release scheduled for later this month or early November.  She is not having any fever, chills, unexplained weight loss.  She has not had any trauma to either shoulder or the neck but does have some chronic neck pain.   Past Surgical History:  Procedure Laterality Date   BRONCHOSCOPY  08/2007   CARPAL TUNNEL RELEASE  05/20/2011   Procedure: CARPAL TUNNEL RELEASE;  Surgeon: [name], MD;  Location: [facility] OR;  Service: Orthopedics;  Laterality: Right;   CARPAL TUNNEL RELEASE Right 11/22/2019   Procedure: RIGHT HAND CARPAL TUNNEL RELEASE;  Surgeon: Jones Broom, MD;  Location: [facility];  Service: Orthopedics;  Laterality: Right;   CARPAL TUNNEL RELEASE Left 02/25/2022   Procedure: CARPAL TUNNEL RELEASE;  Surgeon: Jones Broom, MD;  Location: [facility];  Service: Orthopedics;  Laterality: Left;   CESAREAN SECTION  09/15/2004   COLONOSCOPY N/A 01/21/2014   Procedure: COLONOSCOPY;  Surgeon: Iva Boop, MD;  Location: [facility];  Service: Endoscopy;  Laterality: N/A;   cortisone injection     receives an injection every 3months   ESOPHAGOGASTRODUODENOSCOPY     ESOPHAGOGASTRODUODENOSCOPY N/A 01/21/2014   Procedure: ESOPHAGOGASTRODUODENOSCOPY (EGD);  Surgeon: Iva Boop, MD;  Location: [facility];  Service: Endoscopy;  Laterality: N/A;   LAPAROSCOPIC  CHOLECYSTECTOMY  07/2008   by Dr. Cyndia Bent   Thymus resection  08/25/1993   TRIGGER FINGER RELEASE  05/20/2011   Procedure: RELEASE TRIGGER FINGER/A-1 PULLEY;  Surgeon: [name], MD;  Location: [facility] OR;  Service: Orthopedics;  Laterality: Right;  RIGHT TRIGGER THUMB RELEASE AND RIGHT CARPAL TUNNEL RELEASE    Allergies  Allergen Reactions   Apple Juice Other (See Comments)    "mouth itch" - lumps on tongue  Pt allergic to raw apples, can tolerate cooked apples   Banana Other (See Comments)    "whelps on tongue"   Metformin And Related Nausea Only   Shrimp [Shellfish Allergy] Nausea And Vomiting and Swelling        Objective:  Physical Exam: VS: BP:132/67  HR: bpm  TEMP: ( )  RESP:   HT:5\' 3"  (160 cm)   WT:(!) 345 lb (156.5 kg)  BMI:61.13  Gen: NAD, speaks clearly, comfortable in exam room, sitting in wheel chair Respiratory: normal work of breathing on room air Skin: No rashes, abrasions, or ecchymosis MSK:  Neck: Inspection normal lordosis, no JVD No palpable stepoffs. non TTP  Spurling's maneuver: Positive on right Full neck ROM.   Grip strength and sensation normal in bilateral hands No sensory change to C4 to T1  Shoulder: Inspection no deformity ROM: Full in all directions with painful arc from 80 to 115 degrees of abduction, and restriction with internal rotation to midline.  Difficult to fully assess due to being wheelchair-bound. Strength: 4/5 in all directions AC joint: Nontender Special  tests: Juanetta Gosling, Neer's, O'Briens were negative Empty can, crossover, Speed's positive   Limited ultrasound of left shoulder:  Biceps Tendon SAX and LAX: visualized in bicipital groove w/ trace hypoechoic changes surrounding the tendon Subscapularis tendon - viewed in SAX inserting into the inferior lesser tubercle of humerus.  AC joint -superior osteophyte formation, giser sign positive Supraspinatus tendon - viewed in SAX and LAX, tendon in tact,  insertion at superior facet of greater tubercle of the humerus w/ echogenics: hypoechoic changes within the tendon confirmed in both views, dynamic view w/ out impingement at the acromion    Summary: Findings suggestive of cuff tendinopathy involving the supraspinatus.  Possible mild tendinitis of the biceps tendon  Ultrasound and interpretation by Dr. Webb Silversmith and Dr. Jennette Kettle   Assessment & Plan:   Chronic pain of both shoulders - Ultrasound showing some rotator cuff tendinopathy.  Bupivacaine injection as below.  Steroid injection deferred due to upcoming procedure. - We will send in a referral for physical therapy. - Right shoulder pain suspicious for cervical radiculopathy.  Cervical x-rays pending - We will see her in 2 months for reevaluation.  Can consider steroid injection at that point.  Procedure: Subacromial injection:  After informed written consent timeout was performed, patient was positioned sitting upright in exam chair. The left shoulder was prepped with alcohol swab and utilizing posterior approach, patient's left subacromial space was injected with 5cc of bupivocaine. Patient tolerated the procedure well without immediate complications.     Rica Mote MD [facility] Sports Medicine Fellow

## 2022-10-30 NOTE — Progress Notes (Signed)
[facility]: Attending Note: I have examined the patient, reviewed the chart, discussed the assessment and plan with the Sports Medicine Fellow. I agree with assessment and treatment plan as detailed in the Fellow's note. PERTINENT  PMH / PSH: I have reviewed the patient's medications, allergies, past medical and surgical history, smoking status.  Pertinent findings that relate to today's visit / issues include: Has had carpal tunnel surgery twice on right hand and is plannimng on same surgery on left hand in a few weeks   I was present and participated in the [place] procedure as well as the injection. We avoided use of corticossteroid as she is having carpal tunnel surgery in next few weeks. On return, we could consider that.  [place] left AC joint and part of the rotator cuff musculature Findings: significant arthropathy of Acromioclavicular joint with mushroom sign, surrounding osteophytes.  Supraspinatus was seen in entirety and had no noted defects other than scattered calcifications throughout the muscle.  Remainder of rotator cuff muscles were not examined in entirety secondary to patient discomfort and intolerance of the positioning needed to view these muscles. Conclusion:  Acromioclavicular joint arthropathy.   Intact supraspinatus muscle of the rotator cuff.  Limited exam.

## 2022-11-01 ENCOUNTER — Other Ambulatory Visit: Payer: Self-pay

## 2022-11-05 ENCOUNTER — Other Ambulatory Visit (HOSPITAL_COMMUNITY): Payer: Self-pay

## 2022-11-05 ENCOUNTER — Encounter: Payer: Self-pay | Admitting: Family Medicine

## 2022-11-05 ENCOUNTER — Ambulatory Visit: Payer: 59 | Admitting: Family Medicine

## 2022-11-05 VITALS — BP 121/84 | HR 99 | Ht 63.0 in | Wt 339.5 lb

## 2022-11-05 DIAGNOSIS — E1165 Type 2 diabetes mellitus with hyperglycemia: Secondary | ICD-10-CM

## 2022-11-05 DIAGNOSIS — Z23 Encounter for immunization: Secondary | ICD-10-CM

## 2022-11-05 DIAGNOSIS — G8929 Other chronic pain: Secondary | ICD-10-CM

## 2022-11-05 DIAGNOSIS — Z794 Long term (current) use of insulin: Secondary | ICD-10-CM

## 2022-11-05 DIAGNOSIS — M25551 Pain in right hip: Secondary | ICD-10-CM | POA: Diagnosis not present

## 2022-11-05 LAB — POCT GLYCOSYLATED HEMOGLOBIN (HGB A1C): HbA1c, POC (controlled diabetic range): 7.1 % — AB (ref 0.0–7.0)

## 2022-11-05 MED ORDER — MOUNJARO 15 MG/0.5ML ~~LOC~~ SOAJ
15.0000 mg | SUBCUTANEOUS | 0 refills | Status: DC
  Filled 2022-11-05: qty 6, 84d supply, fill #0

## 2022-11-05 NOTE — Progress Notes (Signed)
    SUBJECTIVE:   CHIEF COMPLAINT / HPI:   Here for checkup  T2DM -Current relevant meds: Mounjaro 12.5mg  weekly, Lantus 30u daily, Jardiance 10mg  daily, Lipitor, pregabalin, irbesartan -Dexcom 90 day report shows 96% in range, avg glucose 138 -Denies significant polyuria, polydipsia, abdominal pain, chest pain, shortness of breath  Lab Results  Component Value Date   HGBA1C 7.1 (A) 11/05/2022   HGBA1C 7.2 (A) 08/24/2022   HGBA1C 6.6 (H) 02/18/2022   Endorses chronic pain in R hip, groin Takes oxy 5mg  q6h and diclofenac twice daily, pregabalin for neuropathy Has tried some PT in the past for her knees and shoulder which has helped. Has not tried it for her hip.    PERTINENT  PMH / PSH: Diabetes, HTN, severe OA  OBJECTIVE:   BP 121/84   Pulse 99   Ht 5\' 3"  (1.6 m)   Wt (!) 339 lb 8 oz (154 kg)   SpO2 98%   BMI 60.14 kg/m    General: NAD, pleasant, able to participate in exam.  In wheelchair Cardiac: RRR, no murmurs auscultated Respiratory: CTAB, normal WOB Abdomen: soft, non-tender, non-distended, normoactive bowel sounds Extremities: warm and well perfused, no edema or cyanosis. B/l feet with intact sensation to sharp/dull and proprioception  Skin: warm and dry, no rashes noted Neuro: alert, no obvious focal deficits, speech normal Psych: Normal affect and mood  R hip: Inspection: No gross deformity, limited by body habitus Palpation: tender to palpation throughout R hip ROM: Limited by pain and habitus Strength: 5/5 Special tests: pain with FABER/FADIR  ASSESSMENT/PLAN:   Assessment & Plan Type 2 diabetes mellitus with hyperglycemia, with long-term current use of insulin (HCC) A1c 7.1 from 7.2.  Still has a lot of room for weight loss, has lost roughly 4 pounds since being on Mounjaro.  Increase Mounjaro to 15 mg weekly.  Continue Lantus 30 units daily, Jardiance 10 mg daily.  CBGs in appropriate range.  Check UACR today.  Advised to get ophthalmology exam  done.  Follow-up in 3 months  Right hip pain Suspect OA of R hip, known severe OA in knees. Has generalized chronic pain. On oxy as well as diclofenac and pregabalin. Would benefit from further eval by pain specialist, placed referral to pain clinic. Also placed referral for PT.    Vonna Drafts, MD [facility]

## 2022-11-05 NOTE — Assessment & Plan Note (Addendum)
A1c 7.1 from 7.2.  Still has a lot of room for weight loss, has lost roughly 4 pounds since being on Mounjaro.  Increase Mounjaro to 15 mg weekly.  Continue Lantus 30 units daily, Jardiance 10 mg daily.  CBGs in appropriate range.  Check UACR today.  Advised to get ophthalmology exam done.  Follow-up in 3 months

## 2022-11-05 NOTE — Patient Instructions (Addendum)
Please increase Mounjaro to 15 mg weekly, I have sent this into your pharmacy.  If you start experiencing any side effects (such as abdominal pain, nausea, vomiting) then you can go back to the 12.5 dose  Continue your current medications otherwise  Please follow-up with sports medicine after your carpal tunnel procedure  I placed a referral for physical therapy as well as the pain clinic

## 2022-11-06 ENCOUNTER — Other Ambulatory Visit (HOSPITAL_COMMUNITY): Payer: Self-pay

## 2022-11-06 LAB — MICROALBUMIN / CREATININE URINE RATIO
Creatinine, Urine: 93.6 mg/dL
Microalb/Creat Ratio: <3 mg/g{creat} (ref 0–29)
Microalbumin, Urine: <3 ug/mL

## 2022-11-11 ENCOUNTER — Ambulatory Visit (HOSPITAL_BASED_OUTPATIENT_CLINIC_OR_DEPARTMENT_OTHER): Payer: 59 | Attending: Family Medicine | Admitting: Physical Therapy

## 2022-11-11 DIAGNOSIS — G8929 Other chronic pain: Secondary | ICD-10-CM | POA: Diagnosis not present

## 2022-11-11 DIAGNOSIS — M6281 Muscle weakness (generalized): Secondary | ICD-10-CM | POA: Diagnosis not present

## 2022-11-11 DIAGNOSIS — R2689 Other abnormalities of gait and mobility: Secondary | ICD-10-CM | POA: Insufficient documentation

## 2022-11-11 DIAGNOSIS — M25512 Pain in left shoulder: Secondary | ICD-10-CM | POA: Insufficient documentation

## 2022-11-11 DIAGNOSIS — M25551 Pain in right hip: Secondary | ICD-10-CM | POA: Insufficient documentation

## 2022-11-11 NOTE — Therapy (Signed)
OUTPATIENT PHYSICAL THERAPY LOWER EXTREMITY EVALUATION   Patient Name: [name] MRN: [id number] DOB:[birth year], 57 y.o., female Today's Date: 11/16/2022  END OF SESSION:  PT End of Session - 11/11/22    Visit Number 1   Number of Visits 16 (8 max aquatic)   Date for PT Re-Evaluation 02/04/2023   Authorization Type    PT Start Time  1123   PT Stop Time 1200   PT Time Calculation (min) 37 min   Activity Tolerance Limited by pain   Behavior During Therapy  [facility]      Past Medical History:  Diagnosis Date   ALLERGIC RHINITIS    takes Zyrtec daily   Anxiety    Arthritis    Asthma    Albuterol prn;Symbicort daily and Singulair daily   Bronchitis    hx of   CAP (community acquired pneumonia) 02/20/2015   Carpal tunnel syndrome    right   Chronic back pain    Constipation    Miralax prn   Depression    takes Cymbalta daily   Diabetes (HCC)    type 2    Eczema    Gallstones 2010   GERD (gastroesophageal reflux disease)    takes Omeprazole daily   Hemorrhoids    Hypertension    takes Prinizide daily and Clonidine on Mondays   Hypoventilation associated with obesity syndrome (HCC)    Insomnia    Irritable bladder    Joint pain    Morbid (severe) obesity due to excess calories (HCC) 04/10/2007   Restrictive changes on pfts 11/30/2007     Myasthenia gravis 1994   Positive Acetylcholine receptor Ab and single fiber EMG Dr. Clar[facility]- Dr. 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 Coralyn Helling- 10/30/2007 ANA positive, ANA titer  negative, RF less than 20   Rheumatoid  arthritis(714.0)    Sarcoidosis 12/23/2010   Derm  dx NCG  12/03/10 (path report in EPIC)    Overview:  Overview:  Diagnosed on skin biopsy November 2012  Last Assessment & Plan:  Labs are unremarkable. CXR improved/stable. PFT wnl. I am happy patient had these tests and we have a baseline for her. At this time, I do not see an indication for starting chronic steroids daily. Patient agrees. Her skin lesions are bothering her the most and syst   Shortness of breath    can be sitting as well as exertion   Skin irritation    skin itchy   Trigger finger    Urinary frequency    takes Ditropan daily   Viral meningitis    history of viral meningitis   Past Surgical History:  Procedure Laterality Date   BRONCHOSCOPY  08/2007   CARPAL TUNNEL RELEASE  05/20/2011   Procedure: CARPAL TUNNEL RELEASE;  Surgeon: [name], MD;  Location: [facility] OR;  Service: Orthopedics;  Laterality: Right;   CARPAL TUNNEL RELEASE Right 11/22/2019   Procedure: RIGHT HAND CARPAL TUNNEL RELEASE;  Surgeon: Jones Broom, MD;  Location: [facility] ORS;  Service: Orthopedics;  Laterality: Right;  CARPAL TUNNEL RELEASE Left 02/25/2022   Procedure: CARPAL TUNNEL RELEASE;  Surgeon: Jones Broom, MD;  Location: [facility] ORS;  Service: Orthopedics;  Laterality: Left;   CESAREAN SECTION  09/15/2004   COLONOSCOPY N/A 01/21/2014   Procedure: COLONOSCOPY;  Surgeon: Iva Boop, MD;  Location: [facility] ENDOSCOPY;  Service: Endoscopy;  Laterality: N/A;   cortisone injection     receives an injection every 3months   ESOPHAGOGASTRODUODENOSCOPY     ESOPHAGOGASTRODUODENOSCOPY N/A 01/21/2014   Procedure: ESOPHAGOGASTRODUODENOSCOPY (EGD);  Surgeon: Iva Boop, MD;  Location: [facility];  Service: Endoscopy;  Laterality: N/A;   LAPAROSCOPIC CHOLECYSTECTOMY  07/2008   by Dr. Cyndia Bent   Thymus resection  08/25/1993   TRIGGER FINGER RELEASE  05/20/2011   Procedure: RELEASE TRIGGER FINGER/A-1 PULLEY;  Surgeon: [name], MD;  Location: [facility] OR;  Service: Orthopedics;  Laterality: Right;  RIGHT TRIGGER THUMB RELEASE AND RIGHT CARPAL TUNNEL RELEASE   Patient Active Problem List   Diagnosis Date Noted   Weight loss advised 03/07/2022   Drug-induced constipation 12/09/2021   Anxiety 09/23/2021   Chronic pain of both shoulders 03/24/2021   Left wrist pain 03/24/2021   Diabetic neuropathy (HCC) 02/25/2021   Healthcare maintenance 10/21/2020   Hyperlipidemia (primary prevention, LDL goal <70) 09/04/2020   Wheelchair dependence 05/05/2020   Carpal tunnel syndrome of right wrist, RECURRENT 10/15/2019   Insomnia, idiopathic 09/30/2018   Grade I diastolic dysfunction 09/30/2018   Encounter for chronic pain management 02/11/2014   Diabetes (HCC) 02/11/2014   Cystocele 09/14/2012   Chronic pain of both knees 05/28/2010   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   Allergic rhinitis 05/30/2008   Carpal tunnel syndrome of left wrist 01/17/2008   OBESITY HYPOVENTILATION SYNDROME 12/27/2007   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   Obstructive sleep apnea 10/30/2007   BMI 50.0-59.9, adult (HCC) 04/10/2007   MYASTHENIA 03/07/2007   Depression, recurrent (HCC) 03/24/2006   Essential (primary) hypertension 03/24/2006   Gastro-esophageal reflux disease without esophagitis 03/24/2006    PCP: Vonna Drafts, MD   REFERRING PROVIDER: Nestor Ramp, MD   REFERRING DIAG: Right hip pain  Chronic left shoulder pain       THERAPY DIAG:  Pain in right hip  Muscle weakness (generalized)  Other abnormalities of gait and mobility  Rationale for Evaluation and Treatment: Rehabilitation  ONSET DATE: 1 yr  SUBJECTIVE:   SUBJECTIVE STATEMENT: Pt reports hip pain x 1 year R>L.  She is unable to define what makes it worse.  She reports spending most time in chair. Needs to shift weight towards her left to relieve pain or lie down.  PERTINENT HISTORY: Evaluate and treat for left  rotator cuff tendinopathy.  Bilateral knee pain x 30 yrs.  Recently completed 8 months of therapy aquatic and land based which ended in Aug for knee pain DC for reaching max potential PAIN:  Are you having pain? Yes: NPRS scale: current 8/10; worst 10/10; least 6 /10 Pain location: hips R>L Pain description: sharp and achy Aggravating factors: Sitting Relieving factors: shifting weight off of   PRECAUTIONS: None  RED FLAGS: None   WEIGHT BEARING RESTRICTIONS: No  FALLS:  Has patient fallen in last 6 months? No  LIVING ENVIRONMENT: Lives with: lives with their family Lives in: House/apartment   OCCUPATION: not working  PLOF: Independent with household mobility with device  PATIENT GOALS: get rid of pain  NEXT MD VISIT: 4 weeks  OBJECTIVE:  Note: Objective measures were completed  at Evaluation unless otherwise noted.  DIAGNOSTIC FINDINGS: No hip diagnostic found in chart  10/29/22 [place] left AC joint and part of the rotator cuff musculature  Conclusion:  Acromioclavicular joint arthropathy.   Intact supraspinatus muscle of the rotator cuff.  Limited exam.   CLINICAL DATA:  57 year old female with a history of chronic right knee pain   EXAM:   RIGHT KNEE 3 VIEWS   COMPARISON:  08/03/2013   FINDINGS: No acute displaced fracture. Advanced joint space narrowing of the mediolateral compartment with marginal osteophyte formation, sclerotic changes. Degenerative changes of the patellofemoral joint. Changes have not progressed since the comparison of 08/03/2013   IMPRESSION: Advanced tricompartmental osteoarthritis, worse than the comparison study of 2015.   No acute bony abnormality identified.     Electronically Signed   By: Gilmer Mor D.O.   On: 11/09/2017 09:34  PATIENT SURVEYS:  FOTO Hip primary score 9%; risk adjusted 40%, goal 36%  COGNITION: Overall cognitive status: Within functional limits for tasks assessed                          SENSATION: Not tested   MUSCLE LENGTH: UTA due to pain levels   POSTURE:  UTA due to poor standing tolerance   PALPATION: Unable to complete due to body habitus  LOWER EXTREMITY ROM:   Difficult to test due to immobility and body habitus Active ROM Right eval Left eval  Hip flexion ~90d ~90  Hip extension ~ -40d ~ -40  Hip abduction    Hip adduction    Hip internal rotation    Hip external rotation    Knee flexion    Knee extension    Ankle dorsiflexion    Ankle plantarflexion    Ankle inversion    Ankle eversion     (Blank rows = not tested)  LOWER EXTREMITY MMT:   Limited by Pain MMT Right eval Left eval  Hip flexion 3 3  Hip extension    Hip abduction 3 3  Hip adduction    Hip internal rotation    Hip external rotation    Knee flexion 3 3  Knee extension 3 3  Ankle dorsiflexion    Ankle plantarflexion    Ankle inversion    Ankle eversion     (Blank rows = not tested)   FUNCTIONAL TESTS:  Timed up and go (TUG): 30.39 30 sec STS 2  GAIT: Distance walked: 10 ft Assistive device utilized: Single point cane and shower chair Level of assistance: Modified independence Comments:  forward flexed position trunk, hips and knees, feet scuffing  Patient using power chair for mobility    TODAY'S TREATMENT:                                                                                                                              Evaluation    PATIENT EDUCATION:  Education details: Discussed eval findings,  rehab rationale, aquatic program progression/POC and pools in area. Patient is in agreement  Person educated: Patient Education method: Explanation Education comprehension: verbalized understanding  HOME EXERCISE PROGRAM: [email or web address] assigned last/recent episode  Added: Access Code: [email or web address] URL: [email or web address] Date: 11/11/2022 Prepared by: Geni Bers  Exercises - Seated Long Arc Quad  - 3 x daily - 5 x weekly - 2 sets -  10 reps - Seated Hamstring Stretch  - 3 x daily - 5 x weekly - 1 sets - 2 reps - 30s hold - Seated Heel Slide  - 3 x daily - 5 x weekly - 2 sets - 10 reps - 2s hold - Seated Hip Abduction  - 3 x daily - 7 x weekly - 2 sets - 10 reps - Seated Hip Flexion  - 3 x daily - 7 x weekly - 2 sets - 10 reps - Seated Active Hip Flexion  - 3 x daily - 7 x weekly - 2 sets - 10 reps  ASSESSMENT:  CLINICAL IMPRESSION: Patient is a 57 y.o. f who was seen today for physical therapy evaluation and treatment for right hip pain and bilat shoulder pain.  Shoulder to be assessed at separate visit with land based therapist. Pt is very well known to our and other [facility] clinics.  She has multiple co morbidities that she has been seen for which are chronic over the past few years.  She reports non-compliance of any HEP with her previous Therapy interludes most recent Dc just 09/23/22 (6 weeks). Discharge completed due to lack of progress/pt having met her max potential. New dx being seen today is hip pain R>L, no dignostics in chart.  She reports sitting majority of day walking only between rooms "some".  No other physical activity.  Knee contractures persist, hip flex contractures present likely due to her constant seated positioning. Muscle testing indicates no increase or decrease in LE strength since Aug DC.  Pt instructed on the importance of movement and completion of HEP in order to be able to manage her chronic conditions.  Also she is educated on her own accountability to participate in her wellness. She is given hand out list of pools in area and VU that we will be see her in aquatics until she is indep with her HEP then will progress to land based as approp. She also VU that once she reaches her max potential with land based therapy it is her responsibility to follow through with assigned HEPs.  She will benefit from skilled aquatic therapy intervention for ROM/stretching and strengthening of LE and core and land based for  shoulder intervention.  OBJECTIVE IMPAIRMENTS: Abnormal gait, decreased activity tolerance, decreased balance, decreased endurance, decreased knowledge of condition, decreased knowledge of use of DME, decreased mobility, difficulty walking, decreased ROM, decreased strength, obesity, and pain.   ACTIVITY LIMITATIONS: carrying, lifting, standing, squatting, stairs, and transfers    PERSONAL FACTORS: Age, Behavior pattern, Fitness, Past/current experiences, Time since onset of injury/illness/exacerbation, and 1 comorbidity: obesity  are also affecting patient's functional outcome.   REHAB POTENTIAL:  Fair based on chronicity, unsuccessful conservative interventions and advanced OA of B knees   CLINICAL DECISION MAKING: Evolving/moderate complexity  EVALUATION COMPLEXITY: Moderate   GOALS: Goals reviewed with patient? Yes  SHORT TERM GOALS: Target date: 12/31/22 Pt will tolerate full aquatic sessions consistently without increase in pain and with improving function to demonstrate good toleration and effectiveness of intervention.  Baseline: Goal status: INITIAL  2.  Pt will report initiation  to gain pool access for completion of aquatic HEP to be assigned Baseline:  Goal status: INITIAL  3.  Pt will report compliance with land based HEP as assigned for LE and shoulders Baseline:  Goal status: INITIAL  4.  Pt shoulders will be assessed by land based therapist and treatment initiated Baseline:  Goal status: INITIAL  5.  Pt will complete 10 consecutive STS from bench onto water step with or without UE support Baseline:  Goal status: INITIAL   LONG TERM GOALS: Target date: 1/10  Pt to meet stated Foto Goal of 36% Baseline: Hip primary score 9%; risk adjusted 40% Goal status: INITIAL  2.  Pt will be indep with final HEP's (land and aquatic as appropriate) for continued management of condition Baseline:  Goal status: INITIAL  3.  Pt will improve strength in all areas listed  by 10lbs or 1 grade to demonstrate improved overall physical function Baseline:  Goal status: INITIAL  4.    Pt will improve hip extension to neutral Baseline:~ -40d bilat Goal status: INITIAL    PLAN:  PT FREQUENCY: 1-2x/week  PT DURATION: 12 weeks extended 4 weeks for potential surgery. 16 visits: 8 max aquatic.  PLANNED INTERVENTIONS: 97164- PT Re-evaluation, 97110-Therapeutic exercises, 97530- Therapeutic activity, [id number]- Neuromuscular re-education, 97535- Self Care, 46962- Manual therapy, [phone]- Gait training, [phone]- Orthotic Fit/training, [phone]- Aquatic Therapy, 97014- Electrical stimulation (unattended), [phone]- Ionotophoresis 4mg /ml Dexamethasone, Balance training, Stair training, Taping, Dry Needling, Joint mobilization, Spinal mobilization, DME instructions, Cryotherapy, and Moist heat  PLAN FOR NEXT SESSION: Will see pt in pool up to 8 visits and land up to 8 visits. She will be having wrist surgery in one month. Land to assess shoulder    Geni Bers, PT 11/16/2022, 1:47 PM

## 2022-11-12 ENCOUNTER — Other Ambulatory Visit: Payer: Self-pay

## 2022-11-12 ENCOUNTER — Encounter (HOSPITAL_BASED_OUTPATIENT_CLINIC_OR_DEPARTMENT_OTHER): Payer: Self-pay | Admitting: Physical Therapy

## 2022-11-16 NOTE — Plan of Care (Signed)
[facility] Tonsillectomy/Adenoidectomy, Postoperative PEDS care plan entered in error.

## 2022-11-17 ENCOUNTER — Ambulatory Visit (HOSPITAL_COMMUNITY): Admission: RE | Admit: 2022-11-17 | Payer: 59 | Source: Home / Self Care | Admitting: Orthopedic Surgery

## 2022-11-17 ENCOUNTER — Encounter (HOSPITAL_COMMUNITY): Admission: RE | Payer: Self-pay | Source: Home / Self Care

## 2022-11-17 SURGERY — CARPAL TUNNEL RELEASE
Anesthesia: Monitor Anesthesia Care | Laterality: Left

## 2022-11-18 ENCOUNTER — Encounter (HOSPITAL_BASED_OUTPATIENT_CLINIC_OR_DEPARTMENT_OTHER): Payer: Self-pay | Admitting: Physical Therapy

## 2022-11-18 ENCOUNTER — Ambulatory Visit (HOSPITAL_BASED_OUTPATIENT_CLINIC_OR_DEPARTMENT_OTHER): Payer: 59 | Admitting: Physical Therapy

## 2022-11-18 DIAGNOSIS — R2689 Other abnormalities of gait and mobility: Secondary | ICD-10-CM | POA: Diagnosis not present

## 2022-11-18 DIAGNOSIS — M25512 Pain in left shoulder: Secondary | ICD-10-CM | POA: Diagnosis not present

## 2022-11-18 DIAGNOSIS — M25551 Pain in right hip: Secondary | ICD-10-CM

## 2022-11-18 DIAGNOSIS — M6281 Muscle weakness (generalized): Secondary | ICD-10-CM

## 2022-11-18 DIAGNOSIS — G8929 Other chronic pain: Secondary | ICD-10-CM | POA: Diagnosis not present

## 2022-11-18 NOTE — Therapy (Signed)
OUTPATIENT PHYSICAL THERAPY LOWER EXTREMITY EVALUATION   Patient Name: [name] MRN: [id number] DOB:1965-12-18, 57 y.o., female Today's Date: 11/18/2022  END OF SESSION:  PT End of Session - 11/18/22 0857     Visit Number 2    Number of Visits 16    Date for PT Re-Evaluation 02/04/23    Authorization Type [employer]    PT Start Time 0900    PT Stop Time 0945    PT Time Calculation (min) 45 min    Activity Tolerance Patient limited by pain    Behavior During Therapy [facility] for tasks assessed/performed              Past Medical History:  Diagnosis Date   ALLERGIC RHINITIS    takes Zyrtec daily   Anxiety    Arthritis    Asthma    Albuterol prn;Symbicort daily and Singulair daily   Bronchitis    hx of   CAP (community acquired pneumonia) 02/20/2015   Carpal tunnel syndrome    right   Chronic back pain    Constipation    Miralax prn   Depression    takes Cymbalta daily   Diabetes (HCC)    type 2    Eczema    Gallstones 2010   GERD (gastroesophageal reflux disease)    takes Omeprazole daily   Hemorrhoids    Hypertension    takes Prinizide daily and Clonidine on Mondays   Hypoventilation associated with obesity syndrome (HCC)    Insomnia    Irritable bladder    Joint pain    Morbid (severe) obesity due to excess calories (HCC) 04/10/2007   Restrictive changes on pfts 11/30/2007     Myasthenia gravis 1994   Positive Acetylcholine receptor Ab and single fiber EMG Dr. Clarisa [facility]- Dr. 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 Coralyn Helling- 10/30/2007 ANA positive, ANA titer   negative, RF less than 20   Rheumatoid arthritis(714.0)    Sarcoidosis 12/23/2010   Derm  dx NCG  12/03/10 (path report in EPIC)    Overview:  Overview:  Diagnosed on skin biopsy November 2012  Last Assessment & Plan:  Labs are unremarkable. CXR improved/stable. PFT wnl. I am happy patient had these tests and we have a baseline for her. At this time, I do not see an indication for starting chronic steroids daily. Patient agrees. Her skin lesions are bothering her the most and syst   Shortness of breath    can be sitting as well as exertion   Skin irritation    skin itchy   Trigger finger    Urinary frequency    takes Ditropan daily   Viral meningitis    history of viral meningitis   Past Surgical History:  Procedure Laterality Date   BRONCHOSCOPY  08/2007   CARPAL TUNNEL RELEASE  05/20/2011   Procedure: CARPAL TUNNEL RELEASE;  Surgeon: [name], MD;  Location: [facility] OR;  Service: Orthopedics;  Laterality: Right;   CARPAL TUNNEL RELEASE Right 11/22/2019   Procedure: RIGHT HAND CARPAL  TUNNEL RELEASE;  Surgeon: Jones Broom, MD;  Location: [facility] ORS;  Service: Orthopedics;  Laterality: Right;   CARPAL TUNNEL RELEASE Left 02/25/2022   Procedure: CARPAL TUNNEL RELEASE;  Surgeon: Jones Broom, MD;  Location: [facility] ORS;  Service: Orthopedics;  Laterality: Left;   CESAREAN SECTION  09/15/2004   COLONOSCOPY N/A 01/21/2014   Procedure: COLONOSCOPY;  Surgeon: Iva Boop, MD;  Location: [facility] ENDOSCOPY;  Service: Endoscopy;  Laterality: N/A;   cortisone injection     receives an injection every 3months   ESOPHAGOGASTRODUODENOSCOPY     ESOPHAGOGASTRODUODENOSCOPY N/A 01/21/2014   Procedure: ESOPHAGOGASTRODUODENOSCOPY (EGD);  Surgeon: Iva Boop, MD;  Location: [facility];  Service: Endoscopy;  Laterality: N/A;   LAPAROSCOPIC CHOLECYSTECTOMY  07/2008   by Dr. Cyndia Bent   Thymus resection  08/25/1993   TRIGGER FINGER RELEASE  05/20/2011   Procedure: RELEASE TRIGGER  FINGER/A-1 PULLEY;  Surgeon: [name], MD;  Location: [facility] OR;  Service: Orthopedics;  Laterality: Right;  RIGHT TRIGGER THUMB RELEASE AND RIGHT CARPAL TUNNEL RELEASE   Patient Active Problem List   Diagnosis Date Noted   Weight loss advised 03/07/2022   Drug-induced constipation 12/09/2021   Anxiety 09/23/2021   Chronic pain of both shoulders 03/24/2021   Left wrist pain 03/24/2021   Diabetic neuropathy (HCC) 02/25/2021   Healthcare maintenance 10/21/2020   Hyperlipidemia (primary prevention, LDL goal <70) 09/04/2020   Wheelchair dependence 05/05/2020   Carpal tunnel syndrome of right wrist, RECURRENT 10/15/2019   Insomnia, idiopathic 09/30/2018   Grade I diastolic dysfunction 09/30/2018   Encounter for chronic pain management 02/11/2014   Diabetes (HCC) 02/11/2014   Cystocele 09/14/2012   Chronic pain of both knees 05/28/2010   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   Allergic rhinitis 05/30/2008   Carpal tunnel syndrome of left wrist 01/17/2008   OBESITY HYPOVENTILATION SYNDROME 12/27/2007   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   Obstructive sleep apnea 10/30/2007   BMI 50.0-59.9, adult (HCC) 04/10/2007   MYASTHENIA 03/07/2007   Depression, recurrent (HCC) 03/24/2006   Essential (primary) hypertension 03/24/2006   Gastro-esophageal reflux disease without esophagitis 03/24/2006    PCP: Vonna Drafts, MD   REFERRING PROVIDER: Nestor Ramp, MD   REFERRING DIAG: Right hip pain  Chronic left shoulder pain       THERAPY DIAG:  Pain in right hip  Muscle weakness (generalized)  Other abnormalities of gait and mobility  Rationale for Evaluation and Treatment: Rehabilitation  ONSET DATE: 1 yr  SUBJECTIVE:   SUBJECTIVE STATEMENT: Last few days hips have been better, I have been stretching more.   Initial subjective Pt reports hip pain x 1 year R>L.  She is unable to define what makes it worse.  She reports spending most time  in chair. Needs to shift weight towards her left to relieve pain or lie down.  PERTINENT HISTORY: Evaluate and treat for left rotator cuff tendinopathy.  Bilateral knee pain x 30 yrs.  Recently completed 8 months of therapy aquatic and land based which ended in Aug for knee pain DC for reaching max potential PAIN:  Are you having pain? Yes: NPRS scale: current 0/10; worst 10/10; least 6 /10 Pain location: hips R>L Pain description: sharp and achy Aggravating factors: Sitting Relieving factors: shifting weight off of   PRECAUTIONS: None  RED FLAGS: None   WEIGHT BEARING RESTRICTIONS: No  FALLS:  Has patient fallen in last 6 months? No  LIVING ENVIRONMENT: Lives with: lives with their family Lives in:  House/apartment   OCCUPATION: not working  PLOF: Independent with household mobility with device  PATIENT GOALS: get rid of pain  NEXT MD VISIT: 4 weeks  OBJECTIVE:  Note: Objective measures were completed at Evaluation unless otherwise noted.  DIAGNOSTIC FINDINGS: No hip diagnostic found in chart  10/29/22 [place] left AC joint and part of the rotator cuff musculature  Conclusion:  Acromioclavicular joint arthropathy.   Intact supraspinatus muscle of the rotator cuff.  Limited exam.   CLINICAL DATA:  57 year old female with a history of chronic right knee pain   EXAM:   RIGHT KNEE 3 VIEWS   COMPARISON:  08/03/2013   FINDINGS: No acute displaced fracture. Advanced joint space narrowing of the mediolateral compartment with marginal osteophyte formation, sclerotic changes. Degenerative changes of the patellofemoral joint. Changes have not progressed since the comparison of 08/03/2013   IMPRESSION: Advanced tricompartmental osteoarthritis, worse than the comparison study of 2015.   No acute bony abnormality identified.     Electronically Signed   By: Gilmer Mor D.O.   On: 11/09/2017 09:34  PATIENT SURVEYS:  FOTO Hip primary score 9%; risk adjusted 40%,  goal 36%  COGNITION: Overall cognitive status: Within functional limits for tasks assessed                         SENSATION: Not tested   MUSCLE LENGTH: UTA due to pain levels   POSTURE:  UTA due to poor standing tolerance   PALPATION: Unable to complete due to body habitus  LOWER EXTREMITY ROM:   Difficult to test due to immobility and body habitus Active ROM Right eval Left eval  Hip flexion ~90d ~90  Hip extension ~ -40d ~ -40  Hip abduction    Hip adduction    Hip internal rotation    Hip external rotation    Knee flexion    Knee extension    Ankle dorsiflexion    Ankle plantarflexion    Ankle inversion    Ankle eversion     (Blank rows = not tested)  LOWER EXTREMITY MMT:   Limited by Pain MMT Right eval Left eval  Hip flexion 3 3  Hip extension    Hip abduction 3 3  Hip adduction    Hip internal rotation    Hip external rotation    Knee flexion 3 3  Knee extension 3 3  Ankle dorsiflexion    Ankle plantarflexion    Ankle inversion    Ankle eversion     (Blank rows = not tested)   FUNCTIONAL TESTS:  Timed up and go (TUG): 30.39 30 sec STS 2  GAIT: Distance walked: 10 ft Assistive device utilized: Single point cane and shower chair Level of assistance: Modified independence Comments:  forward flexed position trunk, hips and knees, feet scuffing  Patient using power chair for mobility    TODAY'S TREATMENT:  Pt seen for aquatic therapy today.  Treatment took place in water 3.5-4.75 ft in depth at the [facility] pool. Temp of water was 91.  Pt entered/exited the pool via stairs using step to pattern with hand rail.  *walking forward and back ue breast stroke movement with blue hand buoys. Cues for heel and toe strike, slowed pace and ue support for balance  *side stepping x 4 widths-> side lunge x 2 widths ue add  (difficult) *Standing hip add stretch x2 with 10s hold *walking for recovery forward *seated on lift: LAQ 2 x 5; hip add/abd 2 x 15; SLR/flutter 2 x 15 *Walking recovery period *standing ue support wall 3.6 ft: pf (pt reports makes her shins hurt); DF x5; hip ext 2 x 5; hip add/abd 2 x 5; hip circles ue support wall and HB x 10 cw&ccw *walking recovery period *standing ue support wall 3.6 ft:   Pt requires the buoyancy and hydrostatic pressure of water for support, and to offload joints by unweighting joint load by at least 50 % in navel deep water and by at least 75-80% in chest to neck deep water.  Viscosity of the water is needed for resistance of strengthening. Water current perturbations provides challenge to standing balance requiring increased core activation.      PATIENT EDUCATION:  Education details: Discussed eval findings, rehab rationale, aquatic program progression/POC and pools in area. Patient is in agreement  Person educated: Patient Education method: Explanation Education comprehension: verbalized understanding  HOME EXERCISE PROGRAM: [email or web address] assigned last/recent episode  Added: Access Code: [email or web address] URL: [email or web address] Date: 11/11/2022 Prepared by: Geni Bers  Exercises - Seated Long Arc Quad  - 3 x daily - 5 x weekly - 2 sets - 10 reps - Seated Hamstring Stretch  - 3 x daily - 5 x weekly - 1 sets - 2 reps - 30s hold - Seated Heel Slide  - 3 x daily - 5 x weekly - 2 sets - 10 reps - 2s hold - Seated Hip Abduction  - 3 x daily - 7 x weekly - 2 sets - 10 reps - Seated Hip Flexion  - 3 x daily - 7 x weekly - 2 sets - 10 reps - Seated Active Hip Flexion  - 3 x daily - 7 x weekly - 2 sets - 10 reps  ASSESSMENT:  CLINICAL IMPRESSION: Pt safe and indep in setting with therapist instructing from deck. Pt with good visual range bilat hip abd observed in sitting with buoyancy assisting with le elevation.  Pt requires cues for slowing pace of  exercise and to engage knee and hips with amb. She tends to circumduct hips avoiding knee flex and TKE.  She has limited toleration to le stretching and is very pain limited with all activities (knees).  Pt edu on grab bars for commode to improve STS transfers.  She is able to tolerate multiple sets of low rep exercises today with intermittent recovery periods either walking or in squatted position.  She reports decreased hip discomfort with the return/addition of stretching instructed at last episode HEP. Goals ongoing   Initial impression Patient is a 57 y.o. f who was seen today for physical therapy evaluation and treatment for right hip pain and bilat shoulder pain.  Shoulder to be assessed at separate visit with land based therapist. Pt is very well known to our and other [facility] clinics.  She has multiple co morbidities that she has been seen for which are chronic over  the past few years.  She reports non-compliance of any HEP with her previous Therapy interludes most recent Dc just 09/23/22 (6 weeks). Discharge completed due to lack of progress/pt having met her max potential. New dx being seen today is hip pain R>L, no dignostics in chart.  She reports sitting majority of day walking only between rooms "some".  No other physical activity.  Knee contractures persist, hip flex contractures present likely due to her constant seated positioning. Muscle testing indicates no increase or decrease in LE strength since Aug DC.  Pt instructed on the importance of movement and completion of HEP in order to be able to manage her chronic conditions.  Also she is educated on her own accountability to participate in her wellness. She is given hand out list of pools in area and VU that we will be see her in aquatics until she is indep with her HEP then will progress to land based as approp. She also VU that once she reaches her max potential with land based therapy it is her responsibility to follow through with assigned  HEPs.  She will benefit from skilled aquatic therapy intervention for ROM/stretching and strengthening of LE and core and land based for shoulder intervention.  OBJECTIVE IMPAIRMENTS: Abnormal gait, decreased activity tolerance, decreased balance, decreased endurance, decreased knowledge of condition, decreased knowledge of use of DME, decreased mobility, difficulty walking, decreased ROM, decreased strength, obesity, and pain.   ACTIVITY LIMITATIONS: carrying, lifting, standing, squatting, stairs, and transfers    PERSONAL FACTORS: Age, Behavior pattern, Fitness, Past/current experiences, Time since onset of injury/illness/exacerbation, and 1 comorbidity: obesity  are also affecting patient's functional outcome.   REHAB POTENTIAL:  Fair based on chronicity, unsuccessful conservative interventions and advanced OA of B knees   CLINICAL DECISION MAKING: Evolving/moderate complexity  EVALUATION COMPLEXITY: Moderate   GOALS: Goals reviewed with patient? Yes  SHORT TERM GOALS: Target date: 12/31/22 Pt will tolerate full aquatic sessions consistently without increase in pain and with improving function to demonstrate good toleration and effectiveness of intervention.  Baseline: Goal status: INITIAL  2.  Pt will report initiation to gain pool access for completion of aquatic HEP to be assigned Baseline:  Goal status: INITIAL  3.  Pt will report compliance with land based HEP as assigned for LE and shoulders Baseline:  Goal status: INITIAL  4.  Pt shoulders will be assessed by land based therapist and treatment initiated Baseline:  Goal status: INITIAL  5.  Pt will complete 10 consecutive STS from bench onto water step with or without UE support Baseline:  Goal status: INITIAL   LONG TERM GOALS: Target date: 1/10  Pt to meet stated Foto Goal of 36% Baseline: Hip primary score 9%; risk adjusted 40% Goal status: INITIAL  2.  Pt will be indep with final HEP's (land and aquatic as  appropriate) for continued management of condition Baseline:  Goal status: INITIAL  3.  Pt will improve strength in all areas listed by 10lbs or 1 grade to demonstrate improved overall physical function Baseline:  Goal status: INITIAL  4.    Pt will improve hip extension to neutral Baseline:~ -40d bilat Goal status: INITIAL    PLAN:  PT FREQUENCY: 1-2x/week  PT DURATION: 12 weeks extended 4 weeks for potential surgery. 16 visits: 8 max aquatic.  PLANNED INTERVENTIONS: 97164- PT Re-evaluation, 97110-Therapeutic exercises, 97530- Therapeutic activity, [id number]- Neuromuscular re-education, 97535- Self Care, [id number]- Manual therapy, [id number]- Gait training, [phone]- Orthotic Fit/training, [id number]- Aquatic Therapy, [phone]- Electrical stimulation (  unattended), [phone]- Ionotophoresis 4mg /ml Dexamethasone, Balance training, Stair training, Taping, Dry Needling, Joint mobilization, Spinal mobilization, DME instructions, Cryotherapy, and Moist heat  PLAN FOR NEXT SESSION: Will see pt in pool up to 8 visits and land up to 8 visits. She will be having wrist surgery in one month. Land to assess shoulder    Geni Bers, PT 11/18/2022, 8:57 AM

## 2022-11-22 ENCOUNTER — Ambulatory Visit: Payer: 59

## 2022-11-22 ENCOUNTER — Telehealth: Payer: Self-pay

## 2022-11-22 VITALS — Ht 63.0 in | Wt 247.0 lb

## 2022-11-22 DIAGNOSIS — Z0000 Encounter for general adult medical examination without abnormal findings: Secondary | ICD-10-CM

## 2022-11-22 NOTE — Telephone Encounter (Signed)
-----   Message from Nurse Percell Miller H sent at 11/22/2022  3:00 PM EDT ----- Please review the Medications, Problem List, Past Medical and Surgical Histories as well as Family and Social Histories.  Patient is having financial hardship, not able to pay utilities or purchase food. Patient is under a lot of stress and not sleeping much at all.  Patient stated that she has difficulty with hearing and needs a hearing consult.  Also requesting order for screening mammogram.    Shenika N. Hatfield, LPN. [facility] AW[facility] Team

## 2022-11-22 NOTE — Patient Instructions (Addendum)
[name] , Thank you for taking time to come for your Medicare Wellness Visit. I appreciate your ongoing commitment to your health goals. Please review the following plan we discussed and let me know if I can assist you in the future.   Referrals/Orders/Follow-Ups/Clinician Recommendations: No  This is a list of the screening recommended for you and due dates:  Health Maintenance  Topic Date Due   Eye exam for diabetics  Never done   Hepatitis C Screening  Never done   Pap with HPV screening  07/04/2020   Mammogram  12/12/2022   COVID-19 Vaccine (8 - 2023-24 season) 12/31/2022   Yearly kidney function blood test for diabetes  02/19/2023   Hemoglobin A1C  05/06/2023   Yearly kidney health urinalysis for diabetes  11/05/2023   Complete foot exam   11/05/2023   Medicare Annual Wellness Visit  11/22/2023   Colon Cancer Screening  01/22/2024   DTaP/Tdap/Td vaccine (3 - Td or Tdap) 07/05/2027   Flu Shot  Completed   HIV Screening  Completed   Zoster (Shingles) Vaccine  Completed   HPV Vaccine  Aged Out    Advanced directives: (In Chart) A copy of your advanced directives are scanned into your chart should your provider ever need it.  Next Medicare Annual Wellness Visit scheduled for next year: Yes

## 2022-11-22 NOTE — Progress Notes (Cosign Needed Addendum)
Subjective:   [name] is a 57 y.o. female who presents for an Initial Medicare Annual Wellness Visit.  Visit Complete: Virtual I connected with  [name] on 11/22/22 by a audio enabled telemedicine application and verified that I am speaking with the correct person using two identifiers.  Patient Location: Home  Provider Location: Office/Clinic  I discussed the limitations of evaluation and management by telemedicine. The patient expressed understanding and agreed to proceed.  Vital Signs: Because this visit was a virtual/telehealth visit, some criteria may be missing or patient reported. Any vitals not documented were not able to be obtained and vitals that have been documented are patient reported.  Cardiac Risk Factors include: advanced age (>59men, >77 women);diabetes mellitus;hypertension;family history of premature cardiovascular disease;obesity (BMI >30kg/m2);sedentary lifestyle     Objective:    Today's Vitals   11/22/22 1422  Weight: 247 lb (112 kg)  Height: 5\' 3"  (1.6 m)  PainSc: 9   PainLoc: Generalized   Body mass index is 43.75 kg/m.     11/05/2022    3:56 PM 08/24/2022    3:46 PM 03/05/2022    4:16 PM 02/25/2022   10:49 AM 02/18/2022    1:24 PM 01/08/2022   11:29 AM 12/08/2021    1:50 PM  Advanced Directives  Does Patient Have a Medical Advance Directive? No No No Yes Yes No No  Type of Aeronautical engineer of [place];Living will Healthcare Power of Comfort;Living will    Does patient want to make changes to medical advance directive?    No - Patient declined     Copy of Healthcare Power of Attorney in Chart?    No - copy requested     Would patient like information on creating a medical advance directive? No - Patient declined  No - Patient declined   No - Patient declined No - Patient declined    Current Medications (verified) Outpatient Encounter Medications as of 11/22/2022  Medication Sig   acetaminophen (TYLENOL)  500 MG tablet Take 1 tablet (500 mg total) by mouth every 6 (six) hours as needed.   albuterol (PROAIR HFA) 108 (90 Base) MCG/ACT inhaler Inhale 2 puffs into the lungs every 4 (four) hours as needed.   amLODipine (NORVASC) 5 MG tablet Take 1 tablet (5 mg total) by mouth at bedtime.   atorvastatin (LIPITOR) 40 MG tablet Take 2 tablets (80 mg total) by mouth daily.   Blood Glucose Monitoring Suppl (ACCU-CHEK GUIDE) w/Device KIT Please use to check blood sugar up to four times daily. (Patient not taking: Reported on 09/23/2021)   budesonide-formoterol (SYMBICORT) 160-4.5 MCG/ACT inhaler Inhale 2 puffs into the lungs 2 (two) times daily.   buPROPion (WELLBUTRIN XL) 300 MG 24 hr tablet Take 1 tablet (300 mg total) by mouth daily.   cetirizine (ZYRTEC) 10 MG tablet Take 1 tablet (10 mg total) by mouth daily.   Continuous Glucose Transmitter (DEXCOM G6 TRANSMITTER) MISC Use as directed and replace every 90 days   Continuous Glucose Receiver (DEXCOM G7 RECEIVER) DEVI Use as directed   Continuous Glucose Sensor (DEXCOM G7 SENSOR) MISC Apply 1 sensor every 10 days   cycloSPORINE (RESTASIS MULTIDOSE) 0.05 % ophthalmic emulsion Place 1 drop into both eyes 2 (two) times daily.   diclofenac (VOLTAREN) 50 MG EC tablet Take 1 tablet (50 mg total) by mouth 2 (two) times daily as needed.   empagliflozin (JARDIANCE) 10 MG TABS tablet Take 1 tablet by mouth daily.  furosemide (LASIX) 20 MG tablet Take 1 tablet (20 mg total) by mouth daily as needed.   glucose blood (ACCU-CHEK GUIDE) test strip Use to check blood sugar in the morning, at noon and at bedtime as directed   hydrochlorothiazide (HYDRODIURIL) 25 MG tablet Take 1 tablet (25 mg total) by mouth daily.   hydrOXYzine (ATARAX) 50 MG tablet Take 1 - 2 tablets (50 - 100 mg total) by mouth 3 times daily as needed for anxiety.   insulin glargine (LANTUS SOLOSTAR) 100 UNIT/ML Solostar Pen Inject 30 Units into the skin daily.   Insulin Pen Needle (TECHLITE PEN  NEEDLES) 31G X 5 MM MISC Use to inject Lantus Solostar as directed.   irbesartan (AVAPRO) 150 MG tablet Take 1 tablet (150 mg total) by mouth at bedtime.   montelukast (SINGULAIR) 10 MG tablet Take 1 tablet (10 mg total) by mouth at bedtime.   omeprazole (PRILOSEC) 40 MG capsule Take 1 capsule (40 mg total) by mouth daily.   oxybutynin (DITROPAN) 5 MG tablet Take 1 tablet (5 mg total) by mouth 2 (two) times daily.   oxyCODONE (OXY IR/ROXICODONE) 5 MG immediate release tablet Take 1 tablet (5 mg total) by mouth every 6 (six) hours as needed for severe pain or breakthrough pain.   pregabalin (LYRICA) 75 MG capsule Take 1 capsule (75 mg total) by mouth 2 (two) times daily. Back down to once daily if you become drowsy.   tirzepatide (MOUNJARO) 15 MG/0.5ML Pen Inject 15 mg into the skin once a week.   No facility-administered encounter medications on file as of 11/22/2022.    Allergies (verified) Apple juice, Banana, Metformin and related, and Shrimp [shellfish allergy]   History: Past Medical History:  Diagnosis Date   ALLERGIC RHINITIS    takes Zyrtec daily   Anxiety    Arthritis    Asthma    Albuterol prn;Symbicort daily and Singulair daily   Bronchitis    hx of   CAP (community acquired pneumonia) 02/20/2015   Carpal tunnel syndrome    right   Chronic back pain    Constipation    Miralax prn   Depression    takes Cymbalta daily   Diabetes (HCC)    type 2    Eczema    Gallstones 2010   GERD (gastroesophageal reflux disease)    takes Omeprazole daily   Hemorrhoids    Hypertension    takes Prinizide daily and Clonidine on Mondays   Hypoventilation associated with obesity syndrome (HCC)    Insomnia    Irritable bladder    Joint pain    Morbid (severe) obesity due to excess calories (HCC) 04/10/2007   Restrictive changes on pfts 11/30/2007     Myasthenia gravis 1994   Positive Acetylcholine receptor Ab and single fiber EMG Dr. Clarisa [facility]red [facility] 1996- Dr. 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 CXR improved/stable. PFT wnl. I am happy patient had these tests and we have a baseline for her. At this time, I do not see an indication for starting chronic steroids daily. Patient agrees. Her skin lesions are bothering her the most and syst   Shortness of breath    can be sitting as well as exertion   Skin irritation    skin itchy   Trigger finger    Urinary frequency    takes Ditropan daily   Viral meningitis    history of viral meningitis   Past Surgical History:  Procedure Laterality Date   BRONCHOSCOPY  08/2007   CARPAL TUNNEL RELEASE  05/20/2011   Procedure: CARPAL TUNNEL RELEASE;  Surgeon: [name], MD;  Location: [facility] OR;  Service: Orthopedics;  Laterality: Right;   CARPAL TUNNEL RELEASE Right 11/22/2019   Procedure: RIGHT HAND CARPAL TUNNEL RELEASE;  Surgeon: Jones Broom, MD;  Location: [facility] ORS;  Service: Orthopedics;  Laterality: Right;   CARPAL TUNNEL RELEASE Left 02/25/2022   Procedure: CARPAL TUNNEL RELEASE;  Surgeon: Jones Broom, MD;  Location: [facility] ORS;  Service: Orthopedics;  Laterality: Left;   CESAREAN SECTION  09/15/2004   COLONOSCOPY N/A 01/21/2014   Procedure: COLONOSCOPY;  Surgeon: Iva Boop, MD;  Location: [facility] ENDOSCOPY;  Service: Endoscopy;  Laterality: N/A;    cortisone injection     receives an injection every 3months   ESOPHAGOGASTRODUODENOSCOPY     ESOPHAGOGASTRODUODENOSCOPY N/A 01/21/2014   Procedure: ESOPHAGOGASTRODUODENOSCOPY (EGD);  Surgeon: Iva Boop, MD;  Location: [facility];  Service: Endoscopy;  Laterality: N/A;   LAPAROSCOPIC CHOLECYSTECTOMY  07/2008   by Dr. Cyndia Bent   Thymus resection  08/25/1993   TRIGGER FINGER RELEASE  05/20/2011   Procedure: RELEASE TRIGGER FINGER/A-1 PULLEY;  Surgeon: [name], MD;  Location: [facility] OR;  Service: Orthopedics;  Laterality: Right;  RIGHT TRIGGER THUMB RELEASE AND RIGHT CARPAL TUNNEL RELEASE   Family History  Problem Relation Age of Onset   Hypertension Father    Sickle cell trait Father    Diabetes Father        Borderline   Stroke Paternal Grandmother    Sickle cell trait Paternal Grandfather    Diabetes Paternal Grandfather    Lupus Other        Mother's first cousin   Crohn's disease Paternal Aunt    Diabetes Maternal Grandmother    Anesthesia problems Neg Hx    Hypotension Neg Hx    Malignant hyperthermia Neg Hx    Pseudochol deficiency Neg Hx    Colon cancer Neg Hx    Colon polyps Neg Hx    Heart disease Neg Hx    Kidney disease Neg Hx    Esophageal cancer Neg Hx    Gallbladder disease Neg Hx    Social History   Socioeconomic History   Marital status: Widowed    Spouse name: Not on file   Number of children: 4   Years of education: Not on file   Highest education level: Not on file  Occupational History   Occupation: Disabled  Tobacco Use   Smoking status: Never   Smokeless tobacco: Never  Vaping Use   Vaping status: Never Used  Substance and Sexual Activity   Alcohol use: No   Drug use: No   Sexual activity: Yes    Birth control/protection:  Injection  Other Topics Concern   Not on file  Social History Narrative   Lives in a house with husband (s/p incarceration), daughter, 2 sons, and grandson, unemployed, unemployed, no pets, no  tob, no etoh, no exercise, no healthy diet, more than or equal to 12 yrs education, christian, has motorized scooter that she uses periodically, husband smokes in the house   _   ___________________________________________________________________________________________________________________________________________   Update 04/01/2019   Husband is deceased, patient started grief counseling Jan. 2021   Lives with 55 year old son; has difficulty with ADL's   Social Determinants of Health   Financial Resource Strain: High Risk (11/22/2022)   Overall Financial Resource Strain (CARDIA)    Difficulty of Paying Living Expenses: Very hard  Food Insecurity: Food Insecurity Present (11/22/2022)   Hunger Vital Sign    Worried About Running Out of Food in the Last Year: Often true    Ran Out of Food in the Last Year: Often true  Transportation Needs: No Transportation Needs (11/22/2022)   PRAPARE - Administrator, Civil Service (Medical): No    Lack of Transportation (Non-Medical): No  Physical Activity: Insufficiently Active (11/22/2022)   Exercise Vital Sign    Days of Exercise per Week: 2 days    Minutes of Exercise per Session: 40 min  Stress: Stress Concern Present (11/22/2022)   [employer] of Occupational Health - Occupational Stress Questionnaire    Feeling of Stress : Very much  Social Connections: Unknown (11/22/2022)   Social Connection and Isolation Panel [NHANES]    Frequency of Communication with Friends and Family: More than three times a week    Frequency of Social Gatherings with Friends and Family: More than three times a week    Attends Religious Services: Not on Marketing executive or Organizations: Yes    Attends Banker Meetings: Not on file    Marital Status: Married    Tobacco Counseling Counseling given: Not Answered   Clinical Intake:  Pre-visit preparation completed: Yes  Pain : 0-10 Pain Score: 9  Pain Type:  Chronic pain Pain Location: Generalized Effect of Pain on Daily Activities: Pain can diminish job performance, lower motivation to exercise and prevent you from completing daily tasks.  Pain produces disability and affects the quality of life.     BMI - recorded: 43.75 Nutritional Status: BMI > 30  Obese Nutritional Risks: None Diabetes: Yes CBG done?: No Did pt. bring in CBG monitor from home?: No  How often do you need to have someone help you when you read instructions, pamphlets, or other written materials from your doctor or pharmacy?: 1 - Never What is the last grade level you completed in school?: HSG; SOME COLLEGE  Interpreter Needed?: No  Information entered by :: Susie Cassette, LPN.   Activities of Daily Living    11/22/2022    2:46 PM 02/18/2022    1:27 PM  In your present state of health, do you have any difficulty performing the following activities:  Hearing? 1   Vision? 0   Difficulty concentrating or making decisions? 0   Walking or climbing stairs? 1   Dressing or bathing? 1   Doing errands, shopping? 0 0  Preparing Food and eating ? Y   Using the Toilet? N   In the past six months, have you accidently leaked urine? Y   Do you have problems with loss of bowel control? Y   Managing your  Medications? N   Managing your Finances? N   Housekeeping or managing your Housekeeping? Y     Patient Care Team: Vonna Drafts, MD as PCP - General (Family Medicine) Juanell Fairly, RN as [facility] Care Management Aura Camps, MD as Consulting Physician (Ophthalmology)  Indicate any recent Medical Services you may have received from other than [facility] providers in the past year (date may be approximate).     Assessment:   This is a routine wellness examination for Lynda.  Hearing/Vision screen Hearing Screening - Comments:: Patient has hearing difficulty; No hearing aids. Needs hearing consult. Vision Screening - Comments:: Patient does wear  corrective lenses/contacts.  Annual eye exam done by: Aura Camps, MD.    Goals Addressed             This Visit's Progress    Client understands the importance of follow-up with providers by attending scheduled visits        Depression Screen    11/22/2022    2:33 PM 11/05/2022    3:57 PM 08/24/2022    3:45 PM 03/05/2022    4:17 PM 01/08/2022   11:29 AM 12/08/2021    4:54 PM 12/08/2021    2:25 PM  PHQ 2/9 Scores  PHQ - 2 Score 3 3 4 3  0 3 3  PHQ- 9 Score 20 20 18 16  16 16     Fall Risk    11/22/2022    2:29 PM 01/08/2022   11:29 AM 05/05/2020    2:50 PM 10/12/2019    3:20 PM 02/16/2019    4:06 PM  Fall Risk   Falls in the past year? 1 0 0 0 0  Number falls in past yr: 0 0   0  Injury with Fall? 0    0  Follow up Falls evaluation completed;Education provided        MEDICARE RISK AT HOME: Medicare Risk at Home Any stairs in or around the home?: No (PATIENT HAS FRONT AREA RAMP) If so, are there any without handrails?: No Home free of loose throw rugs in walkways, pet beds, electrical cords, etc?: Yes Adequate lighting in your home to reduce risk of falls?: Yes Life alert?: No Use of a cane, walker or w/c?: Yes Grab bars in the bathroom?: No Shower chair or bench in shower?: Yes Elevated toilet seat or a handicapped toilet?: Yes  TIMED UP AND GO:  Was the test performed? No    Cognitive Function:    11/22/2022    2:48 PM  MMSE - Mini Mental State Exam  Not completed: Unable to complete        11/22/2022    2:32 PM  6CIT Screen  What Year? 0 points  What month? 0 points  What time? 0 points  Count back from 20 0 points  Months in reverse 0 points  Repeat phrase 0 points  Total Score 0 points    Immunizations Immunization History  Administered Date(s) Administered   H1N1 01/09/2008   Influenza Split 11/10/2010, 11/01/2011   Influenza Whole 10/25/2007, 11/15/2008, 12/02/2009   Influenza, Seasonal, Injecte, Preservative Fre 11/05/2022    Influenza,inj,Quad PF,6+ Mos 11/03/2012, 10/07/2013, 11/20/2014, 11/27/2015, 11/17/2016, 10/27/2017, 09/27/2018, 10/12/2019, 10/17/2020, 12/08/2021   Influenza-Unspecified 10/27/2017   Moderna Sars-Covid-2 Vaccination 04/03/2019, 05/03/2019, 12/07/2019   PFIZER Comirnaty(Gray Top)Covid-19 Tri-Sucrose Vaccine 05/16/2020, 12/08/2021   PPD Test 12/23/2010   Pfizer Covid-19 Vaccine Bivalent Booster 35yrs & up 11/24/2020   Pfizer(Comirnaty)Fall Seasonal Vaccine 12 years and older 11/05/2022  Pneumococcal Polysaccharide-23 12/27/2012   Td 03/25/2005   Tdap 07/04/2017   Zoster Recombinant(Shingrix) 12/20/2020, 04/16/2021    TDAP status: Up to date  Flu Vaccine status: Up to date  Pneumococcal vaccine status: Up to date  Covid-19 vaccine status: Completed vaccines  Qualifies for Shingles Vaccine? Yes   Zostavax completed No   Shingrix Completed?: Yes  Screening Tests Health Maintenance  Topic Date Due   OPHTHALMOLOGY EXAM  Never done   Hepatitis C Screening  Never done   Cervical Cancer Screening (HPV/Pap Cotest)  07/04/2020   MAMMOGRAM  12/12/2022   COVID-19 Vaccine (8 - 2023-24 season) 12/31/2022   Diabetic kidney evaluation - eGFR measurement  02/19/2023   HEMOGLOBIN A1C  05/06/2023   Diabetic kidney evaluation - Urine ACR  11/05/2023   FOOT EXAM  11/05/2023   Medicare Annual Wellness (AWV)  11/22/2023   Colonoscopy  01/22/2024   DTaP/Tdap/Td (3 - Td or Tdap) 07/05/2027   INFLUENZA VACCINE  Completed   HIV Screening  Completed   Zoster Vaccines- Shingrix  Completed   HPV VACCINES  Aged Out    Health Maintenance  Health Maintenance Due  Topic Date Due   OPHTHALMOLOGY EXAM  Never done   Hepatitis C Screening  Never done   Cervical Cancer Screening (HPV/Pap Cotest)  07/04/2020    Colorectal cancer screening: Type of screening: Colonoscopy. Completed 01/21/2014. Repeat every 10 years  Mammogram status: Completed 12/11/2020. Repeat every year  Bone Density status:  Never done.  Lung Cancer Screening: (Low Dose CT Chest recommended if Age 87-80 years, 20 pack-year currently smoking OR have quit w/in 15years.) does not qualify.   Lung Cancer Screening Referral: no  Additional Screening:  Hepatitis C Screening: does qualify; Completed:no  Vision Screening: Recommended annual ophthalmology exams for early detection of glaucoma and other disorders of the eye. Is the patient up to date with their annual eye exam?  Yes  Who is the provider or what is the name of the office in which the patient attends annual eye exams? Aura Camps, MD. If pt is not established with a provider, would they like to be referred to a provider to establish care? No .   Dental Screening: Recommended annual dental exams for proper oral hygiene  Diabetic Foot Exam: Diabetic Foot Exam: Completed 11/05/2022  Community Resource Referral / Chronic Care Management: CRR required this visit?  No   CCM required this visit?  No     Plan:     I have personally reviewed and noted the following in the patient's chart:   Medical and social history Use of alcohol, tobacco or illicit drugs  Current medications and supplements including opioid prescriptions. Patient is currently taking opioid prescriptions. Information provided to patient regarding non-opioid alternatives. Patient advised to discuss non-opioid treatment plan with their provider. Functional ability and status Nutritional status Physical activity Advanced directives List of other physicians Hospitalizations, surgeries, and ER visits in previous 12 months Vitals Screenings to include cognitive, depression, and falls Referrals and appointments  In addition, I have reviewed and discussed with patient certain preventive protocols, quality metrics, and best practice recommendations. A written personalized care plan for preventive services as well as general preventive health recommendations were provided to patient.      Mickeal Needy, LPN   16/10/9602   After Visit Summary: (MyChart) Due to this being a telephonic visit, the after visit summary with patients personalized plan was offered to patient via MyChart   Nurse Notes: See routing  comments.

## 2022-11-23 ENCOUNTER — Encounter (HOSPITAL_BASED_OUTPATIENT_CLINIC_OR_DEPARTMENT_OTHER): Payer: Self-pay | Admitting: Physical Therapy

## 2022-11-23 ENCOUNTER — Ambulatory Visit (HOSPITAL_BASED_OUTPATIENT_CLINIC_OR_DEPARTMENT_OTHER): Payer: 59 | Admitting: Physical Therapy

## 2022-11-23 DIAGNOSIS — M6281 Muscle weakness (generalized): Secondary | ICD-10-CM

## 2022-11-23 DIAGNOSIS — G8929 Other chronic pain: Secondary | ICD-10-CM | POA: Diagnosis not present

## 2022-11-23 DIAGNOSIS — M25551 Pain in right hip: Secondary | ICD-10-CM

## 2022-11-23 DIAGNOSIS — R2689 Other abnormalities of gait and mobility: Secondary | ICD-10-CM | POA: Diagnosis not present

## 2022-11-23 DIAGNOSIS — M25512 Pain in left shoulder: Secondary | ICD-10-CM | POA: Diagnosis not present

## 2022-11-23 NOTE — Therapy (Signed)
OUTPATIENT PHYSICAL THERAPY LOWER EXTREMITY EVALUATION   Patient Name: [name] MRN: [id number] DOB:1965/08/26, 57 y.o., female Today's Date: 11/23/2022  END OF SESSION:  PT End of Session - 11/23/22 1712     Visit Number 3    Number of Visits 16    Date for PT Re-Evaluation 02/04/23    Authorization Type [employer]    PT Start Time 1701    PT Stop Time 1745    PT Time Calculation (min) 44 min    Activity Tolerance Patient limited by pain;Patient tolerated treatment well    Behavior During Therapy [facility] for tasks assessed/performed              Past Medical History:  Diagnosis Date   ALLERGIC RHINITIS    takes Zyrtec daily   Anxiety    Arthritis    Asthma    Albuterol prn;Symbicort daily and Singulair daily   Bronchitis    hx of   CAP (community acquired pneumonia) 02/20/2015   Carpal tunnel syndrome    right   Chronic back pain    Constipation    Miralax prn   Depression    takes Cymbalta daily   Diabetes (HCC)    type 2    Eczema    Gallstones 2010   GERD (gastroesophageal reflux disease)    takes Omeprazole daily   Hemorrhoids    Hypertension    takes Prinizide daily and Clonidine on Mondays   Hypoventilation associated with obesity syndrome (HCC)    Insomnia    Irritable bladder    Joint pain    Morbid (severe) obesity due to excess calories (HCC) 04/10/2007   Restrictive changes on pfts 11/30/2007     Myasthenia gravis 1994   Positive Acetylcholine receptor Ab and single fiber EMG Dr. Clarisa [facility] 1996- Dr. 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 Coralyn Helling-  10/30/2007 ANA positive, ANA titer  negative, RF less than 20   Rheumatoid arthritis(714.0)    Sarcoidosis 12/23/2010   Derm  dx NCG  12/03/10 (path report in EPIC)    Overview:  Overview:  Diagnosed on skin biopsy November 2012  Last Assessment & Plan:  Labs are unremarkable. CXR improved/stable. PFT wnl. I am happy patient had these tests and we have a baseline for her. At this time, I do not see an indication for starting chronic steroids daily. Patient agrees. Her skin lesions are bothering her the most and syst   Shortness of breath    can be sitting as well as exertion   Skin irritation    skin itchy   Trigger finger    Urinary frequency    takes Ditropan daily   Viral meningitis    history of viral meningitis   Past Surgical History:  Procedure Laterality Date   BRONCHOSCOPY  08/2007   CARPAL TUNNEL RELEASE  05/20/2011   Procedure: CARPAL TUNNEL RELEASE;  Surgeon: [name], MD;  Location: [facility] OR;  Service: Orthopedics;  Laterality: Right;   CARPAL TUNNEL RELEASE Right 11/22/2019   Procedure:  RIGHT HAND CARPAL TUNNEL RELEASE;  Surgeon: Jones Broom, MD;  Location: [facility] ORS;  Service: Orthopedics;  Laterality: Right;   CARPAL TUNNEL RELEASE Left 02/25/2022   Procedure: CARPAL TUNNEL RELEASE;  Surgeon: Jones Broom, MD;  Location: [facility] ORS;  Service: Orthopedics;  Laterality: Left;   CESAREAN SECTION  09/15/2004   COLONOSCOPY N/A 01/21/2014   Procedure: COLONOSCOPY;  Surgeon: Iva Boop, MD;  Location: [facility] ENDOSCOPY;  Service: Endoscopy;  Laterality: N/A;   cortisone injection     receives an injection every 3months   ESOPHAGOGASTRODUODENOSCOPY     ESOPHAGOGASTRODUODENOSCOPY N/A 01/21/2014   Procedure: ESOPHAGOGASTRODUODENOSCOPY (EGD);  Surgeon: Iva Boop, MD;  Location: [facility];  Service: Endoscopy;  Laterality: N/A;   LAPAROSCOPIC CHOLECYSTECTOMY  07/2008   by Dr. Cyndia Bent   Thymus resection  08/25/1993   TRIGGER FINGER RELEASE  05/20/2011    Procedure: RELEASE TRIGGER FINGER/A-1 PULLEY;  Surgeon: [name], MD;  Location: [facility] OR;  Service: Orthopedics;  Laterality: Right;  RIGHT TRIGGER THUMB RELEASE AND RIGHT CARPAL TUNNEL RELEASE   Patient Active Problem List   Diagnosis Date Noted   Weight loss advised 03/07/2022   Drug-induced constipation 12/09/2021   Anxiety 09/23/2021   Chronic pain of both shoulders 03/24/2021   Left wrist pain 03/24/2021   Diabetic neuropathy (HCC) 02/25/2021   Healthcare maintenance 10/21/2020   Hyperlipidemia (primary prevention, LDL goal <70) 09/04/2020   Wheelchair dependence 05/05/2020   Carpal tunnel syndrome of right wrist, RECURRENT 10/15/2019   Insomnia, idiopathic 09/30/2018   Grade I diastolic dysfunction 09/30/2018   Encounter for chronic pain management 02/11/2014   Diabetes (HCC) 02/11/2014   Cystocele 09/14/2012   Chronic pain of both knees 05/28/2010   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   Allergic rhinitis 05/30/2008   Carpal tunnel syndrome of left wrist 01/17/2008   OBESITY HYPOVENTILATION SYNDROME 12/27/2007   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   Obstructive sleep apnea 10/30/2007   BMI 50.0-59.9, adult (HCC) 04/10/2007   MYASTHENIA 03/07/2007   Depression, recurrent (HCC) 03/24/2006   Essential (primary) hypertension 03/24/2006   Gastro-esophageal reflux disease without esophagitis 03/24/2006    PCP: Vonna Drafts, MD   REFERRING PROVIDER: Nestor Ramp, MD   REFERRING DIAG: Right hip pain  Chronic left shoulder pain       THERAPY DIAG:  Pain in right hip  Muscle weakness (generalized)  Other abnormalities of gait and mobility  Rationale for Evaluation and Treatment: Rehabilitation  ONSET DATE: 1 yr  SUBJECTIVE:   SUBJECTIVE STATEMENT: Nov 13 left carpel tunnel release. "Hurts some after last session but was ok next day"   Initial subjective Pt reports hip pain x 1 year R>L.  She is unable to define what  makes it worse.  She reports spending most time in chair. Needs to shift weight towards her left to relieve pain or lie down.  PERTINENT HISTORY: Evaluate and treat for left rotator cuff tendinopathy.  Bilateral knee pain x 30 yrs.  Recently completed 8 months of therapy aquatic and land based which ended in Aug for knee pain DC for reaching max potential PAIN:  Are you having pain? Yes: NPRS scale: current 0/10; worst 10/10; least 6 /10 Pain location: hips R>L Pain description: sharp and achy Aggravating factors: Sitting Relieving factors: shifting weight off of   Knees 6/10  PRECAUTIONS: None  RED FLAGS: None   WEIGHT BEARING RESTRICTIONS: No  FALLS:  Has patient fallen in last 6 months? No  LIVING ENVIRONMENT: Lives with: lives with their family Lives in: House/apartment   OCCUPATION: not working  PLOF: Independent with household mobility with device  PATIENT GOALS: get rid of pain  NEXT MD VISIT: 4 weeks  OBJECTIVE:  Note: Objective measures were completed at Evaluation unless otherwise noted.  DIAGNOSTIC FINDINGS: No hip diagnostic found in chart  10/29/22 [place] left AC joint and part of the rotator cuff musculature  Conclusion:  Acromioclavicular joint arthropathy.   Intact supraspinatus muscle of the rotator cuff.  Limited exam.   CLINICAL DATA:  57 year old female with a history of chronic right knee pain   EXAM:   RIGHT KNEE 3 VIEWS   COMPARISON:  08/03/2013   FINDINGS: No acute displaced fracture. Advanced joint space narrowing of the mediolateral compartment with marginal osteophyte formation, sclerotic changes. Degenerative changes of the patellofemoral joint. Changes have not progressed since the comparison of 08/03/2013   IMPRESSION: Advanced tricompartmental osteoarthritis, worse than the comparison study of 2015.   No acute bony abnormality identified.     Electronically Signed   By: Gilmer Mor D.O.   On: 11/09/2017  09:34  PATIENT SURVEYS:  FOTO Hip primary score 9%; risk adjusted 40%, goal 36%  COGNITION: Overall cognitive status: Within functional limits for tasks assessed                         SENSATION: Not tested   MUSCLE LENGTH: UTA due to pain levels   POSTURE:  UTA due to poor standing tolerance   PALPATION: Unable to complete due to body habitus  LOWER EXTREMITY ROM:   Difficult to test due to immobility and body habitus Active ROM Right eval Left eval  Hip flexion ~90d ~90  Hip extension ~ -40d ~ -40  Hip abduction    Hip adduction    Hip internal rotation    Hip external rotation    Knee flexion    Knee extension    Ankle dorsiflexion    Ankle plantarflexion    Ankle inversion    Ankle eversion     (Blank rows = not tested)  LOWER EXTREMITY MMT:   Limited by Pain MMT Right eval Left eval  Hip flexion 3 3  Hip extension    Hip abduction 3 3  Hip adduction    Hip internal rotation    Hip external rotation    Knee flexion 3 3  Knee extension 3 3  Ankle dorsiflexion    Ankle plantarflexion    Ankle inversion    Ankle eversion     (Blank rows = not tested)   FUNCTIONAL TESTS:  Timed up and go (TUG): 30.39 30 sec STS 2  GAIT: Distance walked: 10 ft Assistive device utilized: Single point cane and shower chair Level of assistance: Modified independence Comments:  forward flexed position trunk, hips and knees, feet scuffing  Patient using power chair for mobility    TODAY'S TREATMENT:  Pt seen for aquatic therapy today.  Treatment took place in water 3.5-4.75 ft in depth at the [facility] pool. Temp of water was 91.  Pt entered/exited the pool via stairs using step to pattern with hand rail.  *walking forward and back ue breast stroke movement with blue hand buoys. Cues for heel and toe strike, slowed pace and ue  support for balance  *side stepping x 4 widths-> side lunge x 2 widths ue add (difficult) *seated on steps: cycling 2 x 20; hip add/abd 2 x 10 /2 sets; SLR/flutter 2 x 10 /2 sets *SLS ue support yellow->rainbow 75 % submerged able to hold x 20 s; progressed ue abd/add x10 SLS R/L *standing ue support wall 3.6 ft: pf x 5; DF x5; hip ext x10; hip add/abd x10 *TRA sets using solid noodle wide stance then staggered x 10 *support wall and HB x 10 cw&ccw *Walking between exercises for recovery period   Pt requires the buoyancy and hydrostatic pressure of water for support, and to offload joints by unweighting joint load by at least 50 % in navel deep water and by at least 75-80% in chest to neck deep water.  Viscosity of the water is needed for resistance of strengthening. Water current perturbations provides challenge to standing balance requiring increased core activation.      PATIENT EDUCATION:  Education details: Discussed eval findings, rehab rationale, aquatic program progression/POC and pools in area. Patient is in agreement  Person educated: Patient Education method: Explanation Education comprehension: verbalized understanding  HOME EXERCISE PROGRAM: [email or web address] assigned last/recent episode  Added: Access Code: [email or web address] URL: [email or web address] Date: 11/11/2022 Prepared by: Geni Bers  Exercises - Seated Long Arc Quad  - 3 x daily - 5 x weekly - 2 sets - 10 reps - Seated Hamstring Stretch  - 3 x daily - 5 x weekly - 1 sets - 2 reps - 30s hold - Seated Heel Slide  - 3 x daily - 5 x weekly - 2 sets - 10 reps - 2s hold - Seated Hip Abduction  - 3 x daily - 7 x weekly - 2 sets - 10 reps - Seated Hip Flexion  - 3 x daily - 7 x weekly - 2 sets - 10 reps - Seated Active Hip Flexion  - 3 x daily - 7 x weekly - 2 sets - 10 reps  ASSESSMENT:  CLINICAL IMPRESSION: Pt reports little hip pain mostly limited by knee pain. She is limited with all walking although puts  good effort into knee flex with forward and backward amb. Continues to require VC and demo for knee flex with gait.mUnable to tolerate hamstring curls as she states knees need to pop after trying to bend. Good toleration to increased reps with standing exercises and increased resistance (by increasing speed) with hip muscle engagement with sitting exercise. Goals ongoing     Initial impression Patient is a 57 y.o. f who was seen today for physical therapy evaluation and treatment for right hip pain and bilat shoulder pain.  Shoulder to be assessed at separate visit with land based therapist. Pt is very well known to our and other [facility] clinics.  She has multiple co morbidities that she has been seen for which are chronic over the past few years.  She reports non-compliance of any HEP with her previous Therapy interludes most recent Dc just 09/23/22 (6 weeks). Discharge completed due to lack of progress/pt having met her max potential. New dx being seen today is  hip pain R>L, no dignostics in chart.  She reports sitting majority of day walking only between rooms "some".  No other physical activity.  Knee contractures persist, hip flex contractures present likely due to her constant seated positioning. Muscle testing indicates no increase or decrease in LE strength since Aug DC.  Pt instructed on the importance of movement and completion of HEP in order to be able to manage her chronic conditions.  Also she is educated on her own accountability to participate in her wellness. She is given hand out list of pools in area and VU that we will be see her in aquatics until she is indep with her HEP then will progress to land based as approp. She also VU that once she reaches her max potential with land based therapy it is her responsibility to follow through with assigned HEPs.  She will benefit from skilled aquatic therapy intervention for ROM/stretching and strengthening of LE and core and land based for shoulder  intervention.  OBJECTIVE IMPAIRMENTS: Abnormal gait, decreased activity tolerance, decreased balance, decreased endurance, decreased knowledge of condition, decreased knowledge of use of DME, decreased mobility, difficulty walking, decreased ROM, decreased strength, obesity, and pain.   ACTIVITY LIMITATIONS: carrying, lifting, standing, squatting, stairs, and transfers    PERSONAL FACTORS: Age, Behavior pattern, Fitness, Past/current experiences, Time since onset of injury/illness/exacerbation, and 1 comorbidity: obesity  are also affecting patient's functional outcome.   REHAB POTENTIAL:  Fair based on chronicity, unsuccessful conservative interventions and advanced OA of B knees   CLINICAL DECISION MAKING: Evolving/moderate complexity  EVALUATION COMPLEXITY: Moderate   GOALS: Goals reviewed with patient? Yes  SHORT TERM GOALS: Target date: 12/31/22 Pt will tolerate full aquatic sessions consistently without increase in pain and with improving function to demonstrate good toleration and effectiveness of intervention.  Baseline: Goal status: INITIAL  2.  Pt will report initiation to gain pool access for completion of aquatic HEP to be assigned Baseline:  Goal status: INITIAL  3.  Pt will report compliance with land based HEP as assigned for LE and shoulders Baseline:  Goal status: INITIAL  4.  Pt shoulders will be assessed by land based therapist and treatment initiated Baseline:  Goal status: INITIAL  5.  Pt will complete 10 consecutive STS from bench onto water step with or without UE support Baseline:  Goal status: INITIAL   LONG TERM GOALS: Target date: 1/10  Pt to meet stated Foto Goal of 36% Baseline: Hip primary score 9%; risk adjusted 40% Goal status: INITIAL  2.  Pt will be indep with final HEP's (land and aquatic as appropriate) for continued management of condition Baseline:  Goal status: INITIAL  3.  Pt will improve strength in all areas listed by 10lbs  or 1 grade to demonstrate improved overall physical function Baseline:  Goal status: INITIAL  4.    Pt will improve hip extension to neutral Baseline:~ -40d bilat Goal status: INITIAL    PLAN:  PT FREQUENCY: 1-2x/week  PT DURATION: 12 weeks extended 4 weeks for potential surgery. 16 visits: 8 max aquatic.  PLANNED INTERVENTIONS: 97164- PT Re-evaluation, 97110-Therapeutic exercises, 97530- Therapeutic activity, [id number]- Neuromuscular re-education, 97535- Self Care, 24401- Manual therapy, [phone]- Gait training, [phone]- Orthotic Fit/training, [phone]- Aquatic Therapy, 97014- Electrical stimulation (unattended), [phone]- Ionotophoresis 4mg /ml Dexamethasone, Balance training, Stair training, Taping, Dry Needling, Joint mobilization, Spinal mobilization, DME instructions, Cryotherapy, and Moist heat  PLAN FOR NEXT SESSION: Will see pt in pool up to 8 visits and land up to 8  visits. She will be having wrist surgery in one month. Land to assess shoulder    Rushie Chestnut) Cleofas Hudgins MPT 11/23/22 5:47 PM [facility] [address] [place], [place], [address] Phone: [phone]   Fax:  [phone]  11/23/2022, 5:13 PM

## 2022-11-26 ENCOUNTER — Other Ambulatory Visit: Payer: Self-pay | Admitting: Orthopedic Surgery

## 2022-12-03 ENCOUNTER — Ambulatory Visit (HOSPITAL_BASED_OUTPATIENT_CLINIC_OR_DEPARTMENT_OTHER): Payer: 59 | Attending: Family Medicine | Admitting: Physical Therapy

## 2022-12-03 ENCOUNTER — Encounter (HOSPITAL_BASED_OUTPATIENT_CLINIC_OR_DEPARTMENT_OTHER): Payer: Self-pay | Admitting: Physical Therapy

## 2022-12-03 DIAGNOSIS — M25561 Pain in right knee: Secondary | ICD-10-CM | POA: Insufficient documentation

## 2022-12-03 DIAGNOSIS — G8929 Other chronic pain: Secondary | ICD-10-CM | POA: Diagnosis not present

## 2022-12-03 DIAGNOSIS — M25551 Pain in right hip: Secondary | ICD-10-CM | POA: Diagnosis not present

## 2022-12-03 DIAGNOSIS — R2689 Other abnormalities of gait and mobility: Secondary | ICD-10-CM | POA: Diagnosis not present

## 2022-12-03 DIAGNOSIS — M6281 Muscle weakness (generalized): Secondary | ICD-10-CM | POA: Diagnosis not present

## 2022-12-03 DIAGNOSIS — M25562 Pain in left knee: Secondary | ICD-10-CM | POA: Insufficient documentation

## 2022-12-03 NOTE — Therapy (Signed)
OUTPATIENT PHYSICAL THERAPY LOWER EXTREMITY EVALUATION   Patient Name: [name] MRN: [id number] DOB:April 19, 1965, 57 y.o., female Today's Date: 12/03/2022  END OF SESSION:  PT End of Session - 12/03/22 1727     Visit Number 4    Number of Visits 16    Date for PT Re-Evaluation 02/04/23    Authorization Type [employer]    PT Start Time 1720    PT Stop Time 1800    PT Time Calculation (min) 40 min    Activity Tolerance Patient limited by pain;Patient tolerated treatment well    Behavior During Therapy [facility] for tasks assessed/performed              Past Medical History:  Diagnosis Date   ALLERGIC RHINITIS    takes Zyrtec daily   Anxiety    Arthritis    Asthma    Albuterol prn;Symbicort daily and Singulair daily   Bronchitis    hx of   CAP (community acquired pneumonia) 02/20/2015   Carpal tunnel syndrome    right   Chronic back pain    Constipation    Miralax prn   Depression    takes Cymbalta daily   Diabetes (HCC)    type 2    Eczema    Gallstones 2010   GERD (gastroesophageal reflux disease)    takes Omeprazole daily   Hemorrhoids    Hypertension    takes Prinizide daily and Clonidine on Mondays   Hypoventilation associated with obesity syndrome (HCC)    Insomnia    Irritable bladder    Joint pain    Morbid (severe) obesity due to excess calories (HCC) 04/10/2007   Restrictive changes on pfts 11/30/2007     Myasthenia gravis 1994   Positive Acetylcholine receptor Ab and single fiber EMG Dr. Clarisa [facility]- Dr. 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 Coralyn Helling-  10/30/2007 ANA positive, ANA titer  negative, RF less than 20   Rheumatoid arthritis(714.0)    Sarcoidosis 12/23/2010   Derm  dx NCG  12/03/10 (path report in EPIC)    Overview:  Overview:  Diagnosed on skin biopsy November 2012  Last Assessment & Plan:  Labs are unremarkable. CXR improved/stable. PFT wnl. I am happy patient had these tests and we have a baseline for her. At this time, I do not see an indication for starting chronic steroids daily. Patient agrees. Her skin lesions are bothering her the most and syst   Shortness of breath    can be sitting as well as exertion   Skin irritation    skin itchy   Trigger finger    Urinary frequency    takes Ditropan daily   Viral meningitis    history of viral meningitis   Past Surgical History:  Procedure Laterality Date   BRONCHOSCOPY  08/2007   CARPAL TUNNEL RELEASE  05/20/2011   Procedure: CARPAL TUNNEL RELEASE;  Surgeon: [name], MD;  Location: [facility] OR;  Service: Orthopedics;  Laterality: Right;   CARPAL TUNNEL RELEASE Right 11/22/2019   Procedure:  RIGHT HAND CARPAL TUNNEL RELEASE;  Surgeon: Jones Broom, MD;  Location: [facility] ORS;  Service: Orthopedics;  Laterality: Right;   CARPAL TUNNEL RELEASE Left 02/25/2022   Procedure: CARPAL TUNNEL RELEASE;  Surgeon: Jones Broom, MD;  Location: [facility] ORS;  Service: Orthopedics;  Laterality: Left;   CESAREAN SECTION  09/15/2004   COLONOSCOPY N/A 01/21/2014   Procedure: COLONOSCOPY;  Surgeon: Iva Boop, MD;  Location: [facility] END[facility]COPY;  Service: Endoscopy;  Laterality: N/A;   cortisone injection     receives an injection every 3months   ESOPHAGOGASTRODUODENOSCOPY     ESOPHAGOGASTRODUODENOSCOPY N/A 01/21/2014   Procedure: ESOPHAGOGASTRODUODENOSCOPY (EGD);  Surgeon: Iva Boop, MD;  Location: [facility];  Service: Endoscopy;  Laterality: N/A;   LAPAROSCOPIC CHOLECYSTECTOMY  07/2008   by Dr. Cyndia Bent   Thymus resection  08/25/1993   TRIGGER FINGER RELEASE  05/20/2011    Procedure: RELEASE TRIGGER FINGER/A-1 PULLEY;  Surgeon: [name], MD;  Location: [facility] OR;  Service: Orthopedics;  Laterality: Right;  RIGHT TRIGGER THUMB RELEASE AND RIGHT CARPAL TUNNEL RELEASE   Patient Active Problem List   Diagnosis Date Noted   Weight loss advised 03/07/2022   Drug-induced constipation 12/09/2021   Anxiety 09/23/2021   Chronic pain of both shoulders 03/24/2021   Left wrist pain 03/24/2021   Diabetic neuropathy (HCC) 02/25/2021   Healthcare maintenance 10/21/2020   Hyperlipidemia (primary prevention, LDL goal <70) 09/04/2020   Wheelchair dependence 05/05/2020   Carpal tunnel syndrome of right wrist, RECURRENT 10/15/2019   Insomnia, idiopathic 09/30/2018   Grade I diastolic dysfunction 09/30/2018   Encounter for chronic pain management 02/11/2014   Diabetes (HCC) 02/11/2014   Cystocele 09/14/2012   Chronic pain of both knees 05/28/2010   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   Allergic rhinitis 05/30/2008   Carpal tunnel syndrome of left wrist 01/17/2008   OBESITY HYPOVENTILATION SYNDROME 12/27/2007   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   Obstructive sleep apnea 10/30/2007   BMI 50.0-59.9, adult (HCC) 04/10/2007   MYASTHENIA 03/07/2007   Depression, recurrent (HCC) 03/24/2006   Essential (primary) hypertension 03/24/2006   Gastro-esophageal reflux disease without esophagitis 03/24/2006    PCP: Vonna Drafts, MD   REFERRING PROVIDER: Nestor Ramp, MD   REFERRING DIAG: Right hip pain  Chronic left shoulder pain       THERAPY DIAG:  Pain in right hip  Muscle weakness (generalized)  Other abnormalities of gait and mobility  Chronic pain of left knee  Chronic pain of right knee  Rationale for Evaluation and Treatment: Rehabilitation  ONSET DATE: 1 yr  SUBJECTIVE:   SUBJECTIVE STATEMENT: Pt reports cancellation of carpel tunnel surgery due to insurance complication.   Initial subjective Pt reports  hip pain x 1 year R>L.  She is unable to define what makes it worse.  She reports spending most time in chair. Needs to shift weight towards her left to relieve pain or lie down.  PERTINENT HISTORY: Evaluate and treat for left rotator cuff tendinopathy.  Bilateral knee pain x 30 yrs.  Recently completed 8 months of therapy aquatic and land based which ended in Aug for knee pain DC for reaching max potential PAIN:  Are you having pain? Yes: NPRS scale: current 10/10; worst 10/10; least 6 /10 Pain location: hips R>L Pain description: sharp and achy Aggravating factors: Sitting Relieving factors: shifting weight off of   Knees 6/10  PRECAUTIONS: None  RED FLAGS: None   WEIGHT BEARING RESTRICTIONS: No  FALLS:  Has patient  fallen in last 6 months? No  LIVING ENVIRONMENT: Lives with: lives with their family Lives in: House/apartment   OCCUPATION: not working  PLOF: Independent with household mobility with device  PATIENT GOALS: get rid of pain  NEXT MD VISIT: 4 weeks  OBJECTIVE:  Note: Objective measures were completed at Evaluation unless otherwise noted.  DIAGNOSTIC FINDINGS: No hip diagnostic found in chart  10/29/22 [place] left AC joint and part of the rotator cuff musculature  Conclusion:  Acromioclavicular joint arthropathy.   Intact supraspinatus muscle of the rotator cuff.  Limited exam.   CLINICAL DATA:  57 year old female with a history of chronic right knee pain   EXAM:   RIGHT KNEE 3 VIEWS   COMPARISON:  08/03/2013   FINDINGS: No acute displaced fracture. Advanced joint space narrowing of the mediolateral compartment with marginal osteophyte formation, sclerotic changes. Degenerative changes of the patellofemoral joint. Changes have not progressed since the comparison of 08/03/2013   IMPRESSION: Advanced tricompartmental osteoarthritis, worse than the comparison study of 2015.   No acute bony abnormality identified.     Electronically Signed    By: Gilmer Mor D.O.   On: 11/09/2017 09:34  PATIENT SURVEYS:  FOTO Hip primary score 9%; risk adjusted 40%, goal 36%  COGNITION: Overall cognitive status: Within functional limits for tasks assessed                         SENSATION: Not tested   MUSCLE LENGTH: UTA due to pain levels   POSTURE:  UTA due to poor standing tolerance   PALPATION: Unable to complete due to body habitus  LOWER EXTREMITY ROM:   Difficult to test due to immobility and body habitus Active ROM Right eval Left eval  Hip flexion ~90d ~90  Hip extension ~ -40d ~ -40  Hip abduction    Hip adduction    Hip internal rotation    Hip external rotation    Knee flexion    Knee extension    Ankle dorsiflexion    Ankle plantarflexion    Ankle inversion    Ankle eversion     (Blank rows = not tested)  LOWER EXTREMITY MMT:   Limited by Pain MMT Right eval Left eval  Hip flexion 3 3  Hip extension    Hip abduction 3 3  Hip adduction    Hip internal rotation    Hip external rotation    Knee flexion 3 3  Knee extension 3 3  Ankle dorsiflexion    Ankle plantarflexion    Ankle inversion    Ankle eversion     (Blank rows = not tested)   FUNCTIONAL TESTS:  Timed up and go (TUG): 30.39 30 sec STS 2  GAIT: Distance walked: 10 ft Assistive device utilized: Single point cane and shower chair Level of assistance: Modified independence Comments:  forward flexed position trunk, hips and knees, feet scuffing  Patient using power chair for mobility    TODAY'S TREATMENT:  Pt seen for aquatic therapy today.  Treatment took place in water 3.5-4.75 ft in depth at the [facility] pool. Temp of water was 91.  Pt entered/exited the pool via stairs using step to pattern with hand rail.  *walking forward and back ue breast stroke movement with blue hand buoys. Cues for  heel and toe strike, slowed pace and ue support for balance  *side stepping x 4 widths * side lunge x 2 widths ue add/abd  *standing: staggered stance shoulder horizontal add/abd; shoulder add/abd to 90d; flex/ext to 90d *bow&arrow x 10. VC and demonstration for execution *HB carry forward/back x 2 widths each.  VC for knee flex with each step *decompression noodle wrapped anteriorly across chest ue support wall: hip add/abd; skiing *Straddling noodle with cycling ue support wall->HB engaging core   Pt requires the buoyancy and hydrostatic pressure of water for support, and to offload joints by unweighting joint load by at least 50 % in navel deep water and by at least 75-80% in chest to neck deep water.  Viscosity of the water is needed for resistance of strengthening. Water current perturbations provides challenge to standing balance requiring increased core activation.      PATIENT EDUCATION:  Education details: Discussed eval findings, rehab rationale, aquatic program progression/POC and pools in area. Patient is in agreement  Person educated: Patient Education method: Explanation Education comprehension: verbalized understanding  HOME EXERCISE PROGRAM: [email or web address] assigned last/recent episode  Added: Access Code: [email or web address] URL: [email or web address] Date: 11/11/2022 Prepared by: Geni Bers  Exercises - Seated Long Arc Quad  - 3 x daily - 5 x weekly - 2 sets - 10 reps - Seated Hamstring Stretch  - 3 x daily - 5 x weekly - 1 sets - 2 reps - 30s hold - Seated Heel Slide  - 3 x daily - 5 x weekly - 2 sets - 10 reps - 2s hold - Seated Hip Abduction  - 3 x daily - 7 x weekly - 2 sets - 10 reps - Seated Hip Flexion  - 3 x daily - 7 x weekly - 2 sets - 10 reps - Seated Active Hip Flexion  - 3 x daily - 7 x weekly - 2 sets - 10 reps  ASSESSMENT:  CLINICAL IMPRESSION: Pt able to gain suspended vertical positioning engaging core for balance and allowing for back/hip  decompression.  Progressed further gaining sitting balance straddling noodle. Requires unilateral ue support wall or close approximation to wall for unsteadiness. Reduction in hip and knee pain in position with overall reduction of pain post session.  Contiuned discussion on personal pool access. Goals ongoing.     Initial impression Patient is a 57 y.o. f who was seen today for physical therapy evaluation and treatment for right hip pain and bilat shoulder pain.  Shoulder to be assessed at separate visit with land based therapist. Pt is very well known to our and other [facility] clinics.  She has multiple co morbidities that she has been seen for which are chronic over the past few years.  She reports non-compliance of any HEP with her previous Therapy interludes most recent Dc just 09/23/22 (6 weeks). Discharge completed due to lack of progress/pt having met her max potential. New dx being seen today is hip pain R>L, no dignostics in chart.  She reports sitting majority of day walking only between rooms "some".  No other physical activity.  Knee contractures persist, hip flex contractures present likely due to her constant seated positioning.  Muscle testing indicates no increase or decrease in LE strength since Aug DC.  Pt instructed on the importance of movement and completion of HEP in order to be able to manage her chronic conditions.  Also she is educated on her own accountability to participate in her wellness. She is given hand out list of pools in area and VU that we will be see her in aquatics until she is indep with her HEP then will progress to land based as approp. She also VU that once she reaches her max potential with land based therapy it is her responsibility to follow through with assigned HEPs.  She will benefit from skilled aquatic therapy intervention for ROM/stretching and strengthening of LE and core and land based for shoulder intervention.  OBJECTIVE IMPAIRMENTS: Abnormal gait, decreased  activity tolerance, decreased balance, decreased endurance, decreased knowledge of condition, decreased knowledge of use of DME, decreased mobility, difficulty walking, decreased ROM, decreased strength, obesity, and pain.   ACTIVITY LIMITATIONS: carrying, lifting, standing, squatting, stairs, and transfers    PERSONAL FACTORS: Age, Behavior pattern, Fitness, Past/current experiences, Time since onset of injury/illness/exacerbation, and 1 comorbidity: obesity  are also affecting patient's functional outcome.   REHAB POTENTIAL:  Fair based on chronicity, unsuccessful conservative interventions and advanced OA of B knees   CLINICAL DECISION MAKING: Evolving/moderate complexity  EVALUATION COMPLEXITY: Moderate   GOALS: Goals reviewed with patient? Yes  SHORT TERM GOALS: Target date: 12/31/22 Pt will tolerate full aquatic sessions consistently without increase in pain and with improving function to demonstrate good toleration and effectiveness of intervention.  Baseline: Goal status: INITIAL  2.  Pt will report initiation to gain pool access for completion of aquatic HEP to be assigned Baseline:  Goal status: INITIAL  3.  Pt will report compliance with land based HEP as assigned for LE and shoulders Baseline:  Goal status: INITIAL  4.  Pt shoulders will be assessed by land based therapist and treatment initiated Baseline:  Goal status: INITIAL  5.  Pt will complete 10 consecutive STS from bench onto water step with or without UE support Baseline:  Goal status: INITIAL   LONG TERM GOALS: Target date: 1/10  Pt to meet stated Foto Goal of 36% Baseline: Hip primary score 9%; risk adjusted 40% Goal status: INITIAL  2.  Pt will be indep with final HEP's (land and aquatic as appropriate) for continued management of condition Baseline:  Goal status: INITIAL  3.  Pt will improve strength in all areas listed by 10lbs or 1 grade to demonstrate improved overall physical  function Baseline:  Goal status: INITIAL  4.    Pt will improve hip extension to neutral Baseline:~ -40d bilat Goal status: INITIAL    PLAN:  PT FREQUENCY: 1-2x/week  PT DURATION: 12 weeks extended 4 weeks for potential surgery. 16 visits: 8 max aquatic.  PLANNED INTERVENTIONS: 97164- PT Re-evaluation, 97110-Therapeutic exercises, 97530- Therapeutic activity, [id number]- Neuromuscular re-education, 97535- Self Care, 62130- Manual therapy, [phone]- Gait training, [phone]- Orthotic Fit/training, [phone]- Aquatic Therapy, 97014- Electrical stimulation (unattended), [phone]- Ionotophoresis 4mg /ml Dexamethasone, Balance training, Stair training, Taping, Dry Needling, Joint mobilization, Spinal mobilization, DME instructions, Cryotherapy, and Moist heat  PLAN FOR NEXT SESSION: Will see pt in pool up to 8 visits and land up to 8 visits. She will be having wrist surgery in one month. Land to assess shoulder    Rushie Chestnut) Elisha Mcgruder MPT 12/03/22 5:28 PM [facility] [address] [place], [place], [address] Phone: [phone]  Fax:  [phone]  12/03/2022, 5:28 PM

## 2022-12-08 ENCOUNTER — Ambulatory Visit (HOSPITAL_COMMUNITY): Admission: RE | Admit: 2022-12-08 | Payer: 59 | Source: Home / Self Care | Admitting: Orthopedic Surgery

## 2022-12-08 ENCOUNTER — Encounter (HOSPITAL_COMMUNITY): Admission: RE | Payer: Self-pay | Source: Home / Self Care

## 2022-12-08 SURGERY — CARPAL TUNNEL RELEASE
Anesthesia: Monitor Anesthesia Care | Laterality: Left

## 2022-12-14 ENCOUNTER — Ambulatory Visit (HOSPITAL_BASED_OUTPATIENT_CLINIC_OR_DEPARTMENT_OTHER): Payer: 59 | Admitting: Physical Therapy

## 2022-12-14 ENCOUNTER — Telehealth (HOSPITAL_BASED_OUTPATIENT_CLINIC_OR_DEPARTMENT_OTHER): Payer: Self-pay | Admitting: Physical Therapy

## 2022-12-14 ENCOUNTER — Encounter (HOSPITAL_BASED_OUTPATIENT_CLINIC_OR_DEPARTMENT_OTHER): Payer: Self-pay | Admitting: Physical Therapy

## 2022-12-14 NOTE — Telephone Encounter (Signed)
Therapy spoke with patient regarding no show appointment. She reports she will be at her next visit.

## 2022-12-19 ENCOUNTER — Other Ambulatory Visit (HOSPITAL_COMMUNITY): Payer: Self-pay

## 2022-12-20 ENCOUNTER — Other Ambulatory Visit: Payer: Self-pay

## 2022-12-21 ENCOUNTER — Encounter (HOSPITAL_BASED_OUTPATIENT_CLINIC_OR_DEPARTMENT_OTHER): Payer: Self-pay | Admitting: Physical Therapy

## 2022-12-21 ENCOUNTER — Ambulatory Visit (HOSPITAL_BASED_OUTPATIENT_CLINIC_OR_DEPARTMENT_OTHER): Payer: 59 | Admitting: Physical Therapy

## 2022-12-21 DIAGNOSIS — M6281 Muscle weakness (generalized): Secondary | ICD-10-CM | POA: Diagnosis not present

## 2022-12-21 DIAGNOSIS — M25551 Pain in right hip: Secondary | ICD-10-CM

## 2022-12-21 DIAGNOSIS — M25562 Pain in left knee: Secondary | ICD-10-CM | POA: Diagnosis not present

## 2022-12-21 DIAGNOSIS — M25561 Pain in right knee: Secondary | ICD-10-CM

## 2022-12-21 DIAGNOSIS — R2689 Other abnormalities of gait and mobility: Secondary | ICD-10-CM

## 2022-12-21 DIAGNOSIS — G8929 Other chronic pain: Secondary | ICD-10-CM | POA: Diagnosis not present

## 2022-12-21 NOTE — Therapy (Signed)
OUTPATIENT PHYSICAL THERAPY LOWER EXTREMITY TREATMENT   Patient Name: [name] MRN: [id number] DOB:[birth year], 57 y.o., female Today's Date: 12/21/2022  END OF SESSION:  PT End of Session - 12/21/22 1451     Visit Number 5    Number of Visits 16    Date for PT Re-Evaluation 02/04/23    Authorization Type [employer]    PT Start Time 1445    PT Stop Time 1515    PT Time Calculation (min) 30 min    Activity Tolerance Patient limited by pain;Patient tolerated treatment well    Behavior During Therapy [facility] for tasks assessed/performed            PT End of Session - 11/11/22    Visit Number 1   Number of Visits 16 (8 max aquatic)   Date for PT Re-Evaluation 02/04/2023   Authorization Type    PT Start Time  1123   PT Stop Time 1200   PT Time Calculation (min) 37 min   Activity Tolerance Limited by pain   Behavior During Therapy  [facility]      Past Medical History:  Diagnosis Date   ALLERGIC RHINITIS    takes Zyrtec daily   Anxiety    Arthritis    Asthma    Albuterol prn;Symbicort daily and Singulair daily   Bronchitis    hx of   CAP (community acquired pneumonia) 02/20/2015   Carpal tunnel syndrome    right   Chronic back pain    Constipation    Miralax prn   Depression    takes Cymbalta daily   Diabetes (HCC)    type 2    Eczema    Gallstones 2010   GERD (gastroesophageal reflux disease)    takes Omeprazole daily   Hemorrhoids    Hypertension    takes Prinizide daily and Clonidine on Mondays   Hypoventilation associated with obesity syndrome (HCC)    Insomnia    Irritable bladder    Joint pain    Morbid (severe) obesity due to excess calories (HCC) 04/10/2007   Restrictive changes on pfts 11/30/2007     Myasthenia gravis 1994   Positive Acetylcholine receptor Ab and single fiber EMG Dr. Clarisa [facility]- Dr. 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 Coralyn Helling- 10/30/2007 ANA positive, ANA titer  negative, RF less than 20   Rheumatoid arthritis(714.0)    Sarcoidosis 12/23/2010   Derm  dx NCG  12/03/10 (path report in EPIC)    Overview:  Overview:  Diagnosed on skin biopsy November 2012  Last Assessment & Plan:  Labs are unremarkable. CXR improved/stable. PFT wnl. I am happy patient had these tests and we have a baseline for her. At this time, I do not see an indication for starting chronic steroids daily. Patient agrees. Her skin lesions are bothering her the most and syst   Shortness of breath    can be sitting as well as exertion   Skin irritation    skin itchy   Trigger finger    Urinary  frequency    takes Ditropan daily   Viral meningitis    history of viral meningitis   Past Surgical History:  Procedure Laterality Date   BRONCHOSCOPY  08/2007   CARPAL TUNNEL RELEASE  05/20/2011   Procedure: CARPAL TUNNEL RELEASE;  Surgeon: [name], MD;  Location: [facility] OR;  Service: Orthopedics;  Laterality: Right;   CARPAL TUNNEL RELEASE Right 11/22/2019   Procedure: RIGHT HAND CARPAL TUNNEL RELEASE;  Surgeon: Jones Broom, MD;  Location: [facility] ORS;  Service: Orthopedics;  Laterality: Right;   CARPAL TUNNEL RELEASE Left 02/25/2022   Procedure: CARPAL TUNNEL RELEASE;  Surgeon: Jones Broom, MD;  Location: [facility] ORS;  Service: Orthopedics;  Laterality: Left;   CESAREAN SECTION  09/15/2004   COLONOSCOPY N/A 01/21/2014   Procedure: COLONOSCOPY;  Surgeon: Iva Boop, MD;  Location: [facility] ENDOSCOPY;  Service: Endoscopy;  Laterality: N/A;   cortisone injection     receives an injection every 3months   ESOPHAGOGASTRODUODENOSCOPY      ESOPHAGOGASTRODUODENOSCOPY N/A 01/21/2014   Procedure: ESOPHAGOGASTRODUODENOSCOPY (EGD);  Surgeon: Iva Boop, MD;  Location: [facility];  Service: Endoscopy;  Laterality: N/A;   LAPAROSCOPIC CHOLECYSTECTOMY  07/2008   by Dr. Cyndia Bent   Thymus resection  08/25/1993   TRIGGER FINGER RELEASE  05/20/2011   Procedure: RELEASE TRIGGER FINGER/A-1 PULLEY;  Surgeon: [name], MD;  Location: [facility] OR;  Service: Orthopedics;  Laterality: Right;  RIGHT TRIGGER THUMB RELEASE AND RIGHT CARPAL TUNNEL RELEASE   Patient Active Problem List   Diagnosis Date Noted   Weight loss advised 03/07/2022   Drug-induced constipation 12/09/2021   Anxiety 09/23/2021   Chronic pain of both shoulders 03/24/2021   Left wrist pain 03/24/2021   Diabetic neuropathy (HCC) 02/25/2021   Healthcare maintenance 10/21/2020   Hyperlipidemia (primary prevention, LDL goal <70) 09/04/2020   Wheelchair dependence 05/05/2020   Carpal tunnel syndrome of right wrist, RECURRENT 10/15/2019   Insomnia, idiopathic 09/30/2018   Grade I diastolic dysfunction 09/30/2018   Encounter for chronic pain management 02/11/2014   Diabetes (HCC) 02/11/2014   Cystocele 09/14/2012   Chronic pain of both knees 05/28/2010   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   Allergic rhinitis 05/30/2008   Carpal tunnel syndrome of left wrist 01/17/2008   OBESITY HYPOVENTILATION SYNDROME 12/27/2007   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   Obstructive sleep apnea 10/30/2007   BMI 50.0-59.9, adult (HCC) 04/10/2007   MYASTHENIA 03/07/2007   Depression, recurrent (HCC) 03/24/2006   Essential (primary) hypertension 03/24/2006   Gastro-esophageal reflux disease without esophagitis 03/24/2006    PCP: Vonna Drafts, MD   REFERRING PROVIDER: Nestor Ramp, MD   REFERRING DIAG: Right hip pain  Chronic left shoulder pain       THERAPY DIAG:  Muscle weakness (generalized)  Chronic pain of left  knee  Chronic pain of right knee  Other abnormalities of gait and mobility  Pain in right hip  Rationale for Evaluation and Treatment: Rehabilitation  ONSET DATE: 1 yr  SUBJECTIVE:   SUBJECTIVE STATEMENT: Pt reports she has pain everywhere today with her L shoulder causing her the most pain.   Eval: Pt reports hip pain x 1 year R>L.  She is unable to define what makes it worse.  She reports spending most time in chair. Needs to shift weight towards her left to relieve pain or lie down.  PERTINENT HISTORY: Evaluate and treat for left rotator cuff tendinopathy.  Bilateral knee pain x 30 yrs.  Recently completed 8  months of therapy aquatic and land based which ended in Aug for knee pain DC for reaching max potential PAIN:  Are you having pain? Yes: NPRS scale: current 8/10; worst 10/10; least 6 /10 Pain location: hips R>L Pain description: sharp and achy Aggravating factors: Sitting Relieving factors: shifting weight off of   PRECAUTIONS: None  RED FLAGS: None   WEIGHT BEARING RESTRICTIONS: No  FALLS:  Has patient fallen in last 6 months? No  LIVING ENVIRONMENT: Lives with: lives with their family Lives in: House/apartment   OCCUPATION: not working  PLOF: Independent with household mobility with device  PATIENT GOALS: get rid of pain  NEXT MD VISIT: 4 weeks  OBJECTIVE:  Note: Objective measures were completed at Evaluation unless otherwise noted.  DIAGNOSTIC FINDINGS: No hip diagnostic found in chart  10/29/22 [place] left AC joint and part of the rotator cuff musculature  Conclusion:  Acromioclavicular joint arthropathy.   Intact supraspinatus muscle of the rotator cuff.  Limited exam.   CLINICAL DATA:  57 year old female with a history of chronic right knee pain   EXAM:   RIGHT KNEE 3 VIEWS   COMPARISON:  08/03/2013   FINDINGS: No acute displaced fracture. Advanced joint space narrowing of the mediolateral compartment with marginal osteophyte  formation, sclerotic changes. Degenerative changes of the patellofemoral joint. Changes have not progressed since the comparison of 08/03/2013   IMPRESSION: Advanced tricompartmental osteoarthritis, worse than the comparison study of 2015.   No acute bony abnormality identified.     Electronically Signed   By: Gilmer Mor D.O.   On: 11/09/2017 09:34  PATIENT SURVEYS:  FOTO Hip primary score 9%; risk adjusted 40%, goal 36%  COGNITION: Overall cognitive status: Within functional limits for tasks assessed                         SENSATION: Not tested   MUSCLE LENGTH: UTA due to pain levels   POSTURE:  UTA due to poor standing tolerance   PALPATION: Unable to complete due to body habitus  LOWER EXTREMITY ROM:   Difficult to test due to immobility and body habitus Active ROM Right eval Left eval  Hip flexion ~90d ~90  Hip extension ~ -40d ~ -40  Hip abduction    Hip adduction    Hip internal rotation    Hip external rotation    Knee flexion    Knee extension    Ankle dorsiflexion    Ankle plantarflexion    Ankle inversion    Ankle eversion     (Blank rows = not tested)  LOWER EXTREMITY MMT:   Limited by Pain MMT Right eval Left eval  Hip flexion 3 3  Hip extension    Hip abduction 3 3  Hip adduction    Hip internal rotation    Hip external rotation    Knee flexion 3 3  Knee extension 3 3  Ankle dorsiflexion    Ankle plantarflexion    Ankle inversion    Ankle eversion     (Blank rows = not tested)   FUNCTIONAL TESTS:  Timed up and go (TUG): 30.39 30 sec STS 2  GAIT: Distance walked: 10 ft Assistive device utilized: Single point cane and shower chair Level of assistance: Modified independence Comments:  forward flexed position trunk, hips and knees, feet scuffing  Patient using power chair for mobility    TODAY'S TREATMENT: 12/21/2022  Therapeutic Exercise: Aerobic: Supine: Seated: Ball roll outs on table x25; Shoulder Double ER 2x10 YTB; Shoulder Hor Abd x15 YTB Standing: Stretches: Bil UT stretch 3x30"; Bil Levator Scap 3x30" Neuromuscular Re-education: Manual Therapy: Therapeutic Activity: Self Care: Trigger point release education with trigger point massage cane to use when she is home.     PATIENT EDUCATION:  Education details: Discussed eval findings, rehab rationale, aquatic program progression/POC and pools in area. Patient is in agreement  Person educated: Patient Education method: Explanation Education comprehension: verbalized understanding  HOME EXERCISE PROGRAM: [email or web address] assigned last/recent episode  Added: Access Code: [email or web address] URL: [email or web address] Date: 11/11/2022 Prepared by: Geni Bers  Exercises - Seated Long Arc Quad  - 3 x daily - 5 x weekly - 2 sets - 10 reps - Seated Hamstring Stretch  - 3 x daily - 5 x weekly - 1 sets - 2 reps - 30s hold - Seated Heel Slide  - 3 x daily - 5 x weekly - 2 sets - 10 reps - 2s hold - Seated Hip Abduction  - 3 x daily - 7 x weekly - 2 sets - 10 reps - Seated Hip Flexion  - 3 x daily - 7 x weekly - 2 sets - 10 reps - Seated Active Hip Flexion  - 3 x daily - 7 x weekly - 2 sets - 10 reps  ASSESSMENT:  CLINICAL IMPRESSION: Session focused ther ex to promote cervical and shoulder ROM within pain free ROMs. Pt had difficulty with AROM and AAROM shoulder ROM secondary to posterior shoulder pain (L>R), postural deficits, and cervical stiffness that increased with fatigue and greater ROMs. Look to add cervical postural training and shoulder ROM/stretching interventions as tolerated.    Eval: Patient is a 57 y.o. f who was seen today for physical therapy evaluation and treatment for right hip pain and bilat shoulder pain.  Shoulder to be assessed at separate visit with land based therapist. Pt is very well known to our and  other [facility] clinics.  She has multiple co morbidities that she has been seen for which are chronic over the past few years.  She reports non-compliance of any HEP with her previous Therapy interludes most recent Dc just 09/23/22 (6 weeks). Discharge completed due to lack of progress/pt having met her max potential. New dx being seen today is hip pain R>L, no dignostics in chart.  She reports sitting majority of day walking only between rooms "some".  No other physical activity.  Knee contractures persist, hip flex contractures present likely due to her constant seated positioning. Muscle testing indicates no increase or decrease in LE strength since Aug DC.  Pt instructed on the importance of movement and completion of HEP in order to be able to manage her chronic conditions.  Also she is educated on her own accountability to participate in her wellness. She is given hand out list of pools in area and VU that we will be see her in aquatics until she is indep with her HEP then will progress to land based as approp. She also VU that once she reaches her max potential with land based therapy it is her responsibility to follow through with assigned HEPs.  She will benefit from skilled aquatic therapy intervention for ROM/stretching and strengthening of LE and core and land based for shoulder intervention.  OBJECTIVE IMPAIRMENTS: Abnormal gait, decreased activity tolerance, decreased balance, decreased endurance, decreased knowledge of condition, decreased knowledge of use of DME, decreased mobility, difficulty walking, decreased ROM,  decreased strength, obesity, and pain.   ACTIVITY LIMITATIONS: carrying, lifting, standing, squatting, stairs, and transfers    PERSONAL FACTORS: Age, Behavior pattern, Fitness, Past/current experiences, Time since onset of injury/illness/exacerbation, and 1 comorbidity: obesity  are also affecting patient's functional outcome.   REHAB POTENTIAL:  Fair based on chronicity,  unsuccessful conservative interventions and advanced OA of B knees   CLINICAL DECISION MAKING: Evolving/moderate complexity  EVALUATION COMPLEXITY: Moderate   GOALS: Goals reviewed with patient? Yes  SHORT TERM GOALS: Target date: 12/31/22 Pt will tolerate full aquatic sessions consistently without increase in pain and with improving function to demonstrate good toleration and effectiveness of intervention.  Baseline: Goal status: INITIAL  2.  Pt will report initiation to gain pool access for completion of aquatic HEP to be assigned Baseline:  Goal status: INITIAL  3.  Pt will report compliance with land based HEP as assigned for LE and shoulders Baseline:  Goal status: INITIAL  4.  Pt shoulders will be assessed by land based therapist and treatment initiated Baseline:  Goal status: INITIAL  5.  Pt will complete 10 consecutive STS from bench onto water step with or without UE support Baseline:  Goal status: INITIAL   LONG TERM GOALS: Target date: 1/10  Pt to meet stated Foto Goal of 36% Baseline: Hip primary score 9%; risk adjusted 40% Goal status: INITIAL  2.  Pt will be indep with final HEP's (land and aquatic as appropriate) for continued management of condition Baseline:  Goal status: INITIAL  3.  Pt will improve strength in all areas listed by 10lbs or 1 grade to demonstrate improved overall physical function Baseline:  Goal status: INITIAL  4.    Pt will improve hip extension to neutral Baseline:~ -40d bilat Goal status: INITIAL    PLAN:  PT FREQUENCY: 1-2x/week  PT DURATION: 12 weeks extended 4 weeks for potential surgery. 16 visits: 8 max aquatic.  PLANNED INTERVENTIONS: 97164- PT Re-evaluation, 97110-Therapeutic exercises, 97530- Therapeutic activity, [id number]- Neuromuscular re-education, 97535- Self Care, 96045- Manual therapy, [phone]- Gait training, [phone]- Orthotic Fit/training, [phone]- Aquatic Therapy, 97014- Electrical stimulation (unattended), [phone]-  Ionotophoresis 4mg /ml Dexamethasone, Balance training, Stair training, Taping, Dry Needling, Joint mobilization, Spinal mobilization, DME instructions, Cryotherapy, and Moist heat  PLAN FOR NEXT SESSION: Will see pt in pool up to 8 visits and land up to 8 visits. She will be having wrist surgery in one month. Land to assess shoulder    [place], Student-PT  Lorayne Bender PT DPT  This entire session was performed under direct supervision and direction of a licensed therapist/therapist assistant . I have personally read, edited and approve of the note as written.  12/21/2022, 4:04 PM

## 2022-12-28 ENCOUNTER — Encounter (HOSPITAL_BASED_OUTPATIENT_CLINIC_OR_DEPARTMENT_OTHER): Payer: Self-pay | Admitting: Physical Therapy

## 2022-12-28 ENCOUNTER — Ambulatory Visit (HOSPITAL_BASED_OUTPATIENT_CLINIC_OR_DEPARTMENT_OTHER): Payer: 59 | Attending: Family Medicine | Admitting: Physical Therapy

## 2022-12-28 DIAGNOSIS — M25561 Pain in right knee: Secondary | ICD-10-CM | POA: Diagnosis not present

## 2022-12-28 DIAGNOSIS — M25562 Pain in left knee: Secondary | ICD-10-CM | POA: Insufficient documentation

## 2022-12-28 DIAGNOSIS — M25551 Pain in right hip: Secondary | ICD-10-CM | POA: Insufficient documentation

## 2022-12-28 DIAGNOSIS — R2689 Other abnormalities of gait and mobility: Secondary | ICD-10-CM | POA: Diagnosis not present

## 2022-12-28 DIAGNOSIS — G8929 Other chronic pain: Secondary | ICD-10-CM | POA: Insufficient documentation

## 2022-12-28 DIAGNOSIS — M6281 Muscle weakness (generalized): Secondary | ICD-10-CM | POA: Diagnosis not present

## 2022-12-28 NOTE — Therapy (Unsigned)
OUTPATIENT PHYSICAL THERAPY LOWER EXTREMITY TREATMENT   Patient Name: [name] MRN: [id number] DOB:11/14/1965, 57 y.o., female Today's Date: 12/28/2022  END OF SESSION:  PT End of Session - 12/28/22 1615     Visit Number 6    Number of Visits 16    Date for PT Re-Evaluation 02/04/23    Authorization Type [employer]    PT Start Time 1616    PT Stop Time 1650    PT Time Calculation (min) 34 min    Activity Tolerance Patient limited by pain;Patient tolerated treatment well    Behavior During Therapy [facility] for tasks assessed/performed             PT End of Session - 11/11/22    Visit Number 1   Number of Visits 16 (8 max aquatic)   Date for PT Re-Evaluation 02/04/2023   Authorization Type    PT Start Time  1123   PT Stop Time 1200   PT Time Calculation (min) 37 min   Activity Tolerance Limited by pain   Behavior During Therapy  [facility]      Past Medical History:  Diagnosis Date   ALLERGIC RHINITIS    takes Zyrtec daily   Anxiety    Arthritis    Asthma    Albuterol prn;Symbicort daily and Singulair daily   Bronchitis    hx of   CAP (community acquired pneumonia) 02/20/2015   Carpal tunnel syndrome    right   Chronic back pain    Constipation    Miralax prn   Depression    takes Cymbalta daily   Diabetes (HCC)    type 2    Eczema    Gallstones 2010   GERD (gastroesophageal reflux disease)    takes Omeprazole daily   Hemorrhoids    Hypertension    takes Prinizide daily and Clonidine on Mondays   Hypoventilation associated with obesity syndrome (HCC)    Insomnia    Irritable bladder    Joint pain    Morbid (severe) obesity due to excess calories (HCC) 04/10/2007   Restrictive changes on pfts 11/30/2007     Myasthenia gravis 1994   Positive Acetylcholine receptor Ab and single fiber EMG Dr. Clarisa [facility] 1996- Dr. 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 Coralyn Helling- 10/30/2007 ANA positive, ANA titer  negative, RF less than 20   Rheumatoid arthritis(714.0)    Sarcoidosis 12/23/2010   Derm  dx NCG  12/03/10 (path report in EPIC)    Overview:  Overview:  Diagnosed on skin biopsy November 2012  Last Assessment & Plan:  Labs are unremarkable. CXR improved/stable. PFT wnl. I am happy patient had these tests and we have a baseline for her. At this time, I do not see an indication for starting chronic steroids daily. Patient agrees. Her skin lesions are bothering her the most and syst   Shortness of breath    can be sitting as well as exertion   Skin irritation    skin itchy   Trigger finger  Urinary frequency    takes Ditropan daily   Viral meningitis    history of viral meningitis   Past Surgical History:  Procedure Laterality Date   BRONCHOSCOPY  08/2007   CARPAL TUNNEL RELEASE  05/20/2011   Procedure: CARPAL TUNNEL RELEASE;  Surgeon: [name], MD;  Location: [facility] OR;  Service: Orthopedics;  Laterality: Right;   CARPAL TUNNEL RELEASE Right 11/22/2019   Procedure: RIGHT HAND CARPAL TUNNEL RELEASE;  Surgeon: Jones Broom, MD;  Location: [facility] ORS;  Service: Orthopedics;  Laterality: Right;   CARPAL TUNNEL RELEASE Left 02/25/2022   Procedure: CARPAL TUNNEL RELEASE;  Surgeon: Jones Broom, MD;  Location: [facility] ORS;  Service: Orthopedics;  Laterality: Left;   CESAREAN SECTION  09/15/2004   COLONOSCOPY N/A 01/21/2014   Procedure: COLONOSCOPY;  Surgeon: Iva Boop, MD;  Location: [facility] ENDOSCOPY;  Service: Endoscopy;  Laterality: N/A;   cortisone injection     receives an injection every 3months   ESOPHAGOGASTRODUODENOSCOPY      ESOPHAGOGASTRODUODENOSCOPY N/A 01/21/2014   Procedure: ESOPHAGOGASTRODUODENOSCOPY (EGD);  Surgeon: Iva Boop, MD;  Location: [facility];  Service: Endoscopy;  Laterality: N/A;   LAPAROSCOPIC CHOLECYSTECTOMY  07/2008   by Dr. Cyndia Bent   Thymus resection  08/25/1993   TRIGGER FINGER RELEASE  05/20/2011   Procedure: RELEASE TRIGGER FINGER/A-1 PULLEY;  Surgeon: [name], MD;  Location: [facility] OR;  Service: Orthopedics;  Laterality: Right;  RIGHT TRIGGER THUMB RELEASE AND RIGHT CARPAL TUNNEL RELEASE   Patient Active Problem List   Diagnosis Date Noted   Weight loss advised 03/07/2022   Drug-induced constipation 12/09/2021   Anxiety 09/23/2021   Chronic pain of both shoulders 03/24/2021   Left wrist pain 03/24/2021   Diabetic neuropathy (HCC) 02/25/2021   Healthcare maintenance 10/21/2020   Hyperlipidemia (primary prevention, LDL goal <70) 09/04/2020   Wheelchair dependence 05/05/2020   Carpal tunnel syndrome of right wrist, RECURRENT 10/15/2019   Insomnia, idiopathic 09/30/2018   Grade I diastolic dysfunction 09/30/2018   Encounter for chronic pain management 02/11/2014   Diabetes (HCC) 02/11/2014   Cystocele 09/14/2012   Chronic pain of both knees 05/28/2010   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   Allergic rhinitis 05/30/2008   Carpal tunnel syndrome of left wrist 01/17/2008   OBESITY HYPOVENTILATION SYNDROME 12/27/2007   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   Obstructive sleep apnea 10/30/2007   BMI 50.0-59.9, adult (HCC) 04/10/2007   MYASTHENIA 03/07/2007   Depression, recurrent (HCC) 03/24/2006   Essential (primary) hypertension 03/24/2006   Gastro-esophageal reflux disease without esophagitis 03/24/2006    PCP: Vonna Drafts, MD   REFERRING PROVIDER: Nestor Ramp, MD   REFERRING DIAG: Right hip pain  Chronic left shoulder pain       THERAPY DIAG:  Muscle weakness (generalized)  Other abnormalities of gait and  mobility  Chronic pain of left knee  Chronic pain of right knee  Pain in right hip  Rationale for Evaluation and Treatment: Rehabilitation  ONSET DATE: 1 yr  SUBJECTIVE:   SUBJECTIVE STATEMENT: Pt reports she has pain everywhere today with her L shoulder causing her the most pain.   Eval: Pt reports hip pain x 1 year R>L.  She is unable to define what makes it worse.  She reports spending most time in chair. Needs to shift weight towards her left to relieve pain or lie down.  PERTINENT HISTORY: Evaluate and treat for left rotator cuff tendinopathy.  Bilateral knee pain x 30 yrs.  Recently completed  8 months of therapy aquatic and land based which ended in Aug for knee pain DC for reaching max potential PAIN:  Are you having pain? Yes: NPRS scale: current 8/10; worst 10/10; least 6 /10 Pain location: hips R>L Pain description: sharp and achy Aggravating factors: Sitting Relieving factors: shifting weight off of   PRECAUTIONS: None  RED FLAGS: None   WEIGHT BEARING RESTRICTIONS: No  FALLS:  Has patient fallen in last 6 months? No  LIVING ENVIRONMENT: Lives with: lives with their family Lives in: House/apartment   OCCUPATION: not working  PLOF: Independent with household mobility with device  PATIENT GOALS: get rid of pain  NEXT MD VISIT: 4 weeks  OBJECTIVE:  Note: Objective measures were completed at Evaluation unless otherwise noted.  DIAGNOSTIC FINDINGS: No hip diagnostic found in chart  10/29/22 [place] left AC joint and part of the rotator cuff musculature  Conclusion:  Acromioclavicular joint arthropathy.   Intact supraspinatus muscle of the rotator cuff.  Limited exam.   CLINICAL DATA:  57 year old female with a history of chronic right knee pain   EXAM:   RIGHT KNEE 3 VIEWS   COMPARISON:  08/03/2013   FINDINGS: No acute displaced fracture. Advanced joint space narrowing of the mediolateral compartment with marginal osteophyte  formation, sclerotic changes. Degenerative changes of the patellofemoral joint. Changes have not progressed since the comparison of 08/03/2013   IMPRESSION: Advanced tricompartmental osteoarthritis, worse than the comparison study of 2015.   No acute bony abnormality identified.     Electronically Signed   By: Gilmer Mor D.O.   On: 11/09/2017 09:34  PATIENT SURVEYS:  FOTO Hip primary score 9%; risk adjusted 40%, goal 36%  COGNITION: Overall cognitive status: Within functional limits for tasks assessed                         SENSATION: Not tested   MUSCLE LENGTH: UTA due to pain levels   POSTURE:  UTA due to poor standing tolerance   PALPATION: Unable to complete due to body habitus  LOWER EXTREMITY ROM:   Difficult to test due to immobility and body habitus Active ROM Right eval Left eval  Hip flexion ~90d ~90  Hip extension ~ -40d ~ -40  Hip abduction    Hip adduction    Hip internal rotation    Hip external rotation    Knee flexion    Knee extension    Ankle dorsiflexion    Ankle plantarflexion    Ankle inversion    Ankle eversion     (Blank rows = not tested)  LOWER EXTREMITY MMT:   Limited by Pain MMT Right eval Left eval  Hip flexion 3 3  Hip extension    Hip abduction 3 3  Hip adduction    Hip internal rotation    Hip external rotation    Knee flexion 3 3  Knee extension 3 3  Ankle dorsiflexion    Ankle plantarflexion    Ankle inversion    Ankle eversion     (Blank rows = not tested)   FUNCTIONAL TESTS:  Timed up and go (TUG): 30.39 30 sec STS 2  GAIT: Distance walked: 10 ft Assistive device utilized: Single point cane and shower chair Level of assistance: Modified independence Comments:  forward flexed position trunk, hips and knees, feet scuffing  Patient using power chair for mobility    TODAY'S TREATMENT: 12/28/2022   Therapeutic Exercise: Aerobic: Supine: Clam shells 2x12; Quad sets  Bil 10x5" Seated: LAQ bil 2x10;  Calf raises x15; Marching 2x8 Standing: Stretches:  Neuromuscular Re-education: Manual Therapy: Therapeutic Activity: Self Care:                                                                                                                          Previous:  Therapeutic Exercise: Aerobic: Supine: Seated: Ball roll outs on table x25; Shoulder Double ER 2x10 YTB; Shoulder Hor Abd x15 YTB Standing: Stretches: Bil UT stretch 3x30"; Bil Levator Scap 3x30" Neuromuscular Re-education: Manual Therapy: Therapeutic Activity: Self Care: Trigger point release education with trigger point massage cane to use when she is home.     PATIENT EDUCATION:  Education details: Discussed eval findings, rehab rationale, aquatic program progression/POC and pools in area. Patient is in agreement  Person educated: Patient Education method: Explanation Education comprehension: verbalized understanding  HOME EXERCISE PROGRAM: [email or web address] assigned last/recent episode  Added: Access Code: [email or web address] URL: [email or web address] Date: 11/11/2022 Prepared by: Geni Bers  Exercises - Seated Long Arc Quad  - 3 x daily - 5 x weekly - 2 sets - 10 reps - Seated Hamstring Stretch  - 3 x daily - 5 x weekly - 1 sets - 2 reps - 30s hold - Seated Heel Slide  - 3 x daily - 5 x weekly - 2 sets - 10 reps - 2s hold - Seated Hip Abduction  - 3 x daily - 7 x weekly - 2 sets - 10 reps - Seated Hip Flexion  - 3 x daily - 7 x weekly - 2 sets - 10 reps - Seated Active Hip Flexion  - 3 x daily - 7 x weekly - 2 sets - 10 reps  ASSESSMENT:  CLINICAL IMPRESSION: Session focused on ther ex to promote global LE strengthening for functional tasks. Pt able to perform exercises seated and supine, with the latter being the more difficult position. Pt had difficulty with LE AROM during all interventions, especially knee flexion and ext secondary to mobility deficits and muscular strength. Patient able to perform all  interventions without increased pain. Continue to promote global LE strengthening and shoulder ROM as tolerated.      Eval: Patient is a 57 y.o. f who was seen today for physical therapy evaluation and treatment for right hip pain and bilat shoulder pain.  Shoulder to be assessed at separate visit with land based therapist. Pt is very well known to our and other [facility] clinics.  She has multiple co morbidities that she has been seen for which are chronic over the past few years.  She reports non-compliance of any HEP with her previous Therapy interludes most recent Dc just 09/23/22 (6 weeks). Discharge completed due to lack of progress/pt having met her max potential. New dx being seen today is hip pain R>L, no dignostics in chart.  She reports sitting majority of day walking only between rooms "some".  No other physical activity.  Knee contractures persist, hip flex contractures present likely  due to her constant seated positioning. Muscle testing indicates no increase or decrease in LE strength since Aug DC.  Pt instructed on the importance of movement and completion of HEP in order to be able to manage her chronic conditions.  Also she is educated on her own accountability to participate in her wellness. She is given hand out list of pools in area and VU that we will be see her in aquatics until she is indep with her HEP then will progress to land based as approp. She also VU that once she reaches her max potential with land based therapy it is her responsibility to follow through with assigned HEPs.  She will benefit from skilled aquatic therapy intervention for ROM/stretching and strengthening of LE and core and land based for shoulder intervention.  OBJECTIVE IMPAIRMENTS: Abnormal gait, decreased activity tolerance, decreased balance, decreased endurance, decreased knowledge of condition, decreased knowledge of use of DME, decreased mobility, difficulty walking, decreased ROM, decreased strength, obesity,  and pain.   ACTIVITY LIMITATIONS: carrying, lifting, standing, squatting, stairs, and transfers    PERSONAL FACTORS: Age, Behavior pattern, Fitness, Past/current experiences, Time since onset of injury/illness/exacerbation, and 1 comorbidity: obesity  are also affecting patient's functional outcome.   REHAB POTENTIAL:  Fair based on chronicity, unsuccessful conservative interventions and advanced OA of B knees   CLINICAL DECISION MAKING: Evolving/moderate complexity  EVALUATION COMPLEXITY: Moderate   GOALS: Goals reviewed with patient? Yes  SHORT TERM GOALS: Target date: 12/31/22 Pt will tolerate full aquatic sessions consistently without increase in pain and with improving function to demonstrate good toleration and effectiveness of intervention.  Baseline: Goal status: INITIAL  2.  Pt will report initiation to gain pool access for completion of aquatic HEP to be assigned Baseline:  Goal status: INITIAL  3.  Pt will report compliance with land based HEP as assigned for LE and shoulders Baseline:  Goal status: INITIAL  4.  Pt shoulders will be assessed by land based therapist and treatment initiated Baseline:  Goal status: INITIAL  5.  Pt will complete 10 consecutive STS from bench onto water step with or without UE support Baseline:  Goal status: INITIAL   LONG TERM GOALS: Target date: 1/10  Pt to meet stated Foto Goal of 36% Baseline: Hip primary score 9%; risk adjusted 40% Goal status: INITIAL  2.  Pt will be indep with final HEP's (land and aquatic as appropriate) for continued management of condition Baseline:  Goal status: INITIAL  3.  Pt will improve strength in all areas listed by 10lbs or 1 grade to demonstrate improved overall physical function Baseline:  Goal status: INITIAL  4.    Pt will improve hip extension to neutral Baseline:~ -40d bilat Goal status: INITIAL    PLAN:  PT FREQUENCY: 1-2x/week  PT DURATION: 12 weeks extended 4 weeks for  potential surgery. 16 visits: 8 max aquatic.  PLANNED INTERVENTIONS: 97164- PT Re-evaluation, 97110-Therapeutic exercises, 97530- Therapeutic activity, [id number]- Neuromuscular re-education, [phone]- Self Care, 09811- Manual therapy, [phone]- Gait training, [phone]- Orthotic Fit/training, [phone]- Aquatic Therapy, 97014- Electrical stimulation (unattended), [phone]- Ionotophoresis 4mg /ml Dexamethasone, Balance training, Stair training, Taping, Dry Needling, Joint mobilization, Spinal mobilization, DME instructions, Cryotherapy, and Moist heat  PLAN FOR NEXT SESSION: Will see pt in pool up to 8 visits and land up to 8 visits. She will be having wrist surgery in one month. Land to assess shoulder    [place], Student-PT  Lorayne Bender PT DPT  This entire session was performed under direct  supervision and direction of a licensed Estate agent . I have personally read, edited and approve of the note as written.  12/28/2022, 5:00 PM

## 2023-01-04 ENCOUNTER — Ambulatory Visit (HOSPITAL_BASED_OUTPATIENT_CLINIC_OR_DEPARTMENT_OTHER): Payer: 59 | Admitting: Physical Therapy

## 2023-01-07 ENCOUNTER — Ambulatory Visit: Payer: 59 | Admitting: Family Medicine

## 2023-01-11 ENCOUNTER — Ambulatory Visit (HOSPITAL_BASED_OUTPATIENT_CLINIC_OR_DEPARTMENT_OTHER): Payer: 59 | Admitting: Physical Therapy

## 2023-01-11 DIAGNOSIS — M6281 Muscle weakness (generalized): Secondary | ICD-10-CM

## 2023-01-11 DIAGNOSIS — R2689 Other abnormalities of gait and mobility: Secondary | ICD-10-CM | POA: Diagnosis not present

## 2023-01-11 DIAGNOSIS — G8929 Other chronic pain: Secondary | ICD-10-CM | POA: Diagnosis not present

## 2023-01-11 DIAGNOSIS — M25561 Pain in right knee: Secondary | ICD-10-CM | POA: Diagnosis not present

## 2023-01-11 DIAGNOSIS — M25562 Pain in left knee: Secondary | ICD-10-CM | POA: Diagnosis not present

## 2023-01-11 DIAGNOSIS — M25551 Pain in right hip: Secondary | ICD-10-CM | POA: Diagnosis not present

## 2023-01-11 NOTE — Therapy (Signed)
OUTPATIENT PHYSICAL THERAPY LOWER EXTREMITY TREATMENT   Patient Name: [name] MRN: [id number] DOB:[birth year], 57 y.o., female Today's Date: 01/12/2023  END OF SESSION:  PT End of Session - 01/12/23 0819     Visit Number 7    Number of Visits 16    Date for PT Re-Evaluation 02/04/23    Authorization Type [employer]    PT Start Time 1600    PT Stop Time 1643    PT Time Calculation (min) 43 min    Activity Tolerance Patient limited by pain;Patient tolerated treatment well    Behavior During Therapy [facility] for tasks assessed/performed              Past Medical History:  Diagnosis Date   ALLERGIC RHINITIS    takes Zyrtec daily   Anxiety    Arthritis    Asthma    Albuterol prn;Symbicort daily and Singulair daily   Bronchitis    hx of   CAP (community acquired pneumonia) 02/20/2015   Carpal tunnel syndrome    right   Chronic back pain    Constipation    Miralax prn   Depression    takes Cymbalta daily   Diabetes (HCC)    type 2    Eczema    Gallstones 2010   GERD (gastroesophageal reflux disease)    takes Omeprazole daily   Hemorrhoids    Hypertension    takes Prinizide daily and Clonidine on Mondays   Hypoventilation associated with obesity syndrome (HCC)    Insomnia    Irritable bladder    Joint pain    Morbid (severe) obesity due to excess calories (HCC) 04/10/2007   Restrictive changes on pfts 11/30/2007     Myasthenia gravis 1994   Positive Acetylcholine receptor Ab and single fiber EMG Dr. Clarisa [facility]- Dr. 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 Coralyn Helling-  10/30/2007 ANA positive, ANA titer  negative, RF less than 20   Rheumatoid arthritis(714.0)    Sarcoidosis 12/23/2010   Derm  dx NCG  12/03/10 (path report in EPIC)    Overview:  Overview:  Diagnosed on skin biopsy November 2012  Last Assessment & Plan:  Labs are unremarkable. CXR improved/stable. PFT wnl. I am happy patient had these tests and we have a baseline for her. At this time, I do not see an indication for starting chronic steroids daily. Patient agrees. Her skin lesions are bothering her the most and syst   Shortness of breath    can be sitting as well as exertion   Skin irritation    skin itchy   Trigger finger    Urinary frequency    takes Ditropan daily   Viral meningitis    history of viral meningitis   Past Surgical History:  Procedure Laterality Date   BRONCHOSCOPY  08/2007   CARPAL TUNNEL RELEASE  05/20/2011   Procedure: CARPAL TUNNEL RELEASE;  Surgeon: [name], MD;  Location: [facility];  Service: Orthopedics;  Laterality: Right;   CARPAL TUNNEL RELEASE Right 11/22/2019   Procedure:  RIGHT HAND CARPAL TUNNEL RELEASE;  Surgeon: Jones Broom, MD;  Location: [facility] ORS;  Service: Orthopedics;  Laterality: Right;   CARPAL TUNNEL RELEASE Left 02/25/2022   Procedure: CARPAL TUNNEL RELEASE;  Surgeon: Jones Broom, MD;  Location: [facility] ORS;  Service: Orthopedics;  Laterality: Left;   CESAREAN SECTION  09/15/2004   COLONOSCOPY N/A 01/21/2014   Procedure: COLONOSCOPY;  Surgeon: Iva Boop, MD;  Location: [facility] ENDOSCOPY;  Service: Endoscopy;  Laterality: N/A;   cortisone injection     receives an injection every 3months   ESOPHAGOGASTRODUODENOSCOPY     ESOPHAGOGASTRODUODENOSCOPY N/A 01/21/2014   Procedure: ESOPHAGOGASTRODUODENOSCOPY (EGD);  Surgeon: Iva Boop, MD;  Location: [facility];  Service: Endoscopy;  Laterality: N/A;   LAPAROSCOPIC CHOLECYSTECTOMY  07/2008   by Dr. Cyndia Bent   Thymus resection  08/25/1993   TRIGGER FINGER RELEASE  05/20/2011    Procedure: RELEASE TRIGGER FINGER/A-1 PULLEY;  Surgeon: [name], MD;  Location: [facility] OR;  Service: Orthopedics;  Laterality: Right;  RIGHT TRIGGER THUMB RELEASE AND RIGHT CARPAL TUNNEL RELEASE   Patient Active Problem List   Diagnosis Date Noted   Weight loss advised 03/07/2022   Drug-induced constipation 12/09/2021   Anxiety 09/23/2021   Chronic pain of both shoulders 03/24/2021   Left wrist pain 03/24/2021   Diabetic neuropathy (HCC) 02/25/2021   Healthcare maintenance 10/21/2020   Hyperlipidemia (primary prevention, LDL goal <70) 09/04/2020   Wheelchair dependence 05/05/2020   Carpal tunnel syndrome of right wrist, RECURRENT 10/15/2019   Insomnia, idiopathic 09/30/2018   Grade I diastolic dysfunction 09/30/2018   Encounter for chronic pain management 02/11/2014   Diabetes (HCC) 02/11/2014   Cystocele 09/14/2012   Chronic pain of both knees 05/28/2010   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   Allergic rhinitis 05/30/2008   Carpal tunnel syndrome of left wrist 01/17/2008   OBESITY HYPOVENTILATION SYNDROME 12/27/2007   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   Obstructive sleep apnea 10/30/2007   BMI 50.0-59.9, adult (HCC) 04/10/2007   MYASTHENIA 03/07/2007   Depression, recurrent (HCC) 03/24/2006   Essential (primary) hypertension 03/24/2006   Gastro-esophageal reflux disease without esophagitis 03/24/2006    PCP: Vonna Drafts, MD   REFERRING PROVIDER: Nestor Ramp, MD   REFERRING DIAG: Right hip pain  Chronic left shoulder pain       THERAPY DIAG:  Muscle weakness (generalized)  Other abnormalities of gait and mobility  Chronic pain of left knee  Chronic pain of right knee  Pain in right hip  Rationale for Evaluation and Treatment: Rehabilitation  ONSET DATE: 1 yr  SUBJECTIVE:   SUBJECTIVE STATEMENT: The patient reports she is doing OK. She is in the usual amount of pain.   Eval: Pt reports hip pain x 1 year R>L.   She is unable to define what makes it worse.  She reports spending most time in chair. Needs to shift weight towards her left to relieve pain or lie down.  PERTINENT HISTORY: Evaluate and treat for left rotator cuff tendinopathy.  Bilateral knee pain x 30 yrs.  Recently completed 8 months of therapy aquatic and land based which ended in Aug for knee pain DC for reaching max potential PAIN:  Are you having pain? Yes: NPRS scale: current 7/10; worst 10/10; least 6 /10 Pain location: hips R>L Pain description: sharp and achy Aggravating factors: Sitting Relieving factors: shifting weight off of   PRECAUTIONS: None  RED FLAGS: None   WEIGHT BEARING RESTRICTIONS: No  FALLS:  Has patient  fallen in last 6 months? No  LIVING ENVIRONMENT: Lives with: lives with their family Lives in: House/apartment   OCCUPATION: not working  PLOF: Independent with household mobility with device  PATIENT GOALS: get rid of pain  NEXT MD VISIT: 4 weeks  OBJECTIVE:  Note: Objective measures were completed at Evaluation unless otherwise noted.  DIAGNOSTIC FINDINGS: No hip diagnostic found in chart  10/29/22 [place] left AC joint and part of the rotator cuff musculature  Conclusion:  Acromioclavicular joint arthropathy.   Intact supraspinatus muscle of the rotator cuff.  Limited exam.   CLINICAL DATA:  57 year old female with a history of chronic right knee pain   EXAM:   RIGHT KNEE 3 VIEWS   COMPARISON:  08/03/2013   FINDINGS: No acute displaced fracture. Advanced joint space narrowing of the mediolateral compartment with marginal osteophyte formation, sclerotic changes. Degenerative changes of the patellofemoral joint. Changes have not progressed since the comparison of 08/03/2013   IMPRESSION: Advanced tricompartmental osteoarthritis, worse than the comparison study of 2015.   No acute bony abnormality identified.     Electronically Signed   By: Gilmer Mor D.O.   On:  11/09/2017 09:34  PATIENT SURVEYS:  FOTO Hip primary score 9%; risk adjusted 40%, goal 36%  COGNITION: Overall cognitive status: Within functional limits for tasks assessed                         SENSATION: Not tested   MUSCLE LENGTH: UTA due to pain levels   POSTURE:  UTA due to poor standing tolerance   PALPATION: Unable to complete due to body habitus  LOWER EXTREMITY ROM:   Difficult to test due to immobility and body habitus Active ROM Right eval Left eval  Hip flexion ~90d ~90  Hip extension ~ -40d ~ -40  Hip abduction    Hip adduction    Hip internal rotation    Hip external rotation    Knee flexion    Knee extension    Ankle dorsiflexion    Ankle plantarflexion    Ankle inversion    Ankle eversion     (Blank rows = not tested)  LOWER EXTREMITY MMT:   Limited by Pain MMT Right eval Left eval  Hip flexion 3 3  Hip extension    Hip abduction 3 3  Hip adduction    Hip internal rotation    Hip external rotation    Knee flexion 3 3  Knee extension 3 3  Ankle dorsiflexion    Ankle plantarflexion    Ankle inversion    Ankle eversion     (Blank rows = not tested)   FUNCTIONAL TESTS:  Timed up and go (TUG): 30.39 30 sec STS 2  GAIT: Distance walked: 10 ft Assistive device utilized: Single point cane and shower chair Level of assistance: Modified independence Comments:  forward flexed position trunk, hips and knees, feet scuffing  Patient using power chair for mobility    TODAY'S TREATMENT: 01/12/2023  12/17 Nu-step 5 min L3 reviewed set up and use 236 steps   Seated UE: yellow band   Bil ER 2x10  Horizontal abduction 2x10  Fleixon with abduction 2x10   Seated LE:  Heel raise 2x10  Hip abduction 2x10 yellow  Seated march in low low range 2x10     Last visit: Therapeutic Exercise: Aerobic: Supine: Clam shells 2x12; Quad sets Bil 10x5" Seated: LAQ bil 2x10; Calf raises x15; Marching 2x8 Standing: Stretches:  Neuromuscular  Re-education: Manual Therapy: Therapeutic Activity: Self Care:                                                                                                                          Previous:  Therapeutic Exercise: Aerobic: Supine: Seated: Ball roll outs on table x25; Shoulder Double ER 2x10 YTB; Shoulder Hor Abd x15 YTB Standing: Stretches: Bil UT stretch 3x30"; Bil Levator Scap 3x30" Neuromuscular Re-education: Manual Therapy: Therapeutic Activity: Self Care: Trigger point release education with trigger point massage cane to use when she is home.     PATIENT EDUCATION:  Education details: Discussed eval findings, rehab rationale, aquatic program progression/POC and pools in area. Patient is in agreement  Person educated: Patient Education method: Explanation Education comprehension: verbalized understanding  HOME EXERCISE PROGRAM: Land: Access Code: [email or web address] URL: [email or web address] Date: 01/12/2023 Prepared by: Lorayne Bender    [email or web address] assigned last/recent episode  Added: Access Code: [email or web address] URL: [email or web address] Date: 11/11/2022 Prepared by: Geni Bers  Exercises - Seated Long Arc Quad  - 3 x daily - 5 x weekly - 2 sets - 10 reps - Seated Hamstring Stretch  - 3 x daily - 5 x weekly - 1 sets - 2 reps - 30s hold - Seated Heel Slide  - 3 x daily - 5 x weekly - 2 sets - 10 reps - 2s hold - Seated Hip Abduction  - 3 x daily - 7 x weekly - 2 sets - 10 reps - Seated Hip Flexion  - 3 x daily - 7 x weekly - 2 sets - 10 reps - Seated Active Hip Flexion  - 3 x daily - 7 x weekly - 2 sets - 10 reps  ASSESSMENT:  CLINICAL IMPRESSION: Therapy worked on a base home program for the patient. She has frequent high level pain in her legs so she was given an UE program that she can work on on days when the pain is bad in her legs. She tolerated well. She required min cuing for technoque. We also reviewed a LE series for her. She had  some pain in the right hip with flexion. She was advised to keep it low range for now. We updated HEP. See below for goal specific progress.      Eval: Patient is a 57 y.o. f who was seen today for physical therapy evaluation and treatment for right hip pain and bilat shoulder pain.  Shoulder to be assessed at separate visit with land based therapist. Pt is very well known to our and other [facility] clinics.  She has multiple co morbidities that she has been seen for which are chronic over the past few years.  She reports non-compliance of any HEP with her previous Therapy interludes most recent Dc just 09/23/22 (6 weeks). Discharge completed due to lack of progress/pt having met her max potential. New dx being seen today is hip pain R>L, no dignostics in chart.  She reports sitting majority of day walking only between  rooms "some".  No other physical activity.  Knee contractures persist, hip flex contractures present likely due to her constant seated positioning. Muscle testing indicates no increase or decrease in LE strength since Aug DC.  Pt instructed on the importance of movement and completion of HEP in order to be able to manage her chronic conditions.  Also she is educated on her own accountability to participate in her wellness. She is given hand out list of pools in area and VU that we will be see her in aquatics until she is indep with her HEP then will progress to land based as approp. She also VU that once she reaches her max potential with land based therapy it is her responsibility to follow through with assigned HEPs.  She will benefit from skilled aquatic therapy intervention for ROM/stretching and strengthening of LE and core and land based for shoulder intervention.  OBJECTIVE IMPAIRMENTS: Abnormal gait, decreased activity tolerance, decreased balance, decreased endurance, decreased knowledge of condition, decreased knowledge of use of DME, decreased mobility, difficulty walking, decreased ROM,  decreased strength, obesity, and pain.   ACTIVITY LIMITATIONS: carrying, lifting, standing, squatting, stairs, and transfers    PERSONAL FACTORS: Age, Behavior pattern, Fitness, Past/current experiences, Time since onset of injury/illness/exacerbation, and 1 comorbidity: obesity  are also affecting patient's functional outcome.   REHAB POTENTIAL:  Fair based on chronicity, unsuccessful conservative interventions and advanced OA of B knees   CLINICAL DECISION MAKING: Evolving/moderate complexity  EVALUATION COMPLEXITY: Moderate   GOALS: Goals reviewed with patient? Yes  SHORT TERM GOALS: Target date: 12/31/22 Pt will tolerate full aquatic sessions consistently without increase in pain and with improving function to demonstrate good toleration and effectiveness of intervention.  Baseline: Goal status: INITIAL  2.  Pt will report initiation to gain pool access for completion of aquatic HEP to be assigned Baseline:  Goal status: INITIAL  3.  Pt will report compliance with land based HEP as assigned for LE and shoulders Baseline:  Goal status: INITIAL  4.  Pt shoulders will be assessed by land based therapist and treatment initiated Baseline:  Goal status: INITIAL  5.  Pt will complete 10 consecutive STS from bench onto water step with or without UE support Baseline:  Goal status: INITIAL   LONG TERM GOALS: Target date: 1/10  Pt to meet stated Foto Goal of 36% Baseline: Hip primary score 9%; risk adjusted 40% Goal status: INITIAL  2.  Pt will be indep with final HEP's (land and aquatic as appropriate) for continued management of condition Baseline:  Goal status: INITIAL  3.  Pt will improve strength in all areas listed by 10lbs or 1 grade to demonstrate improved overall physical function Baseline:  Goal status: INITIAL  4.    Pt will improve hip extension to neutral Baseline:~ -40d bilat Goal status: INITIAL    PLAN:  PT FREQUENCY: 1-2x/week  PT DURATION: 12  weeks extended 4 weeks for potential surgery. 16 visits: 8 max aquatic.  PLANNED INTERVENTIONS: 97164- PT Re-evaluation, 97110-Therapeutic exercises, 97530- Therapeutic activity, [id number]- Neuromuscular re-education, 97535- Self Care, 52841- Manual therapy, [phone]- Gait training, [phone]- Orthotic Fit/training, [phone]- Aquatic Therapy, 97014- Electrical stimulation (unattended), [phone]- Ionotophoresis 4mg /ml Dexamethasone, Balance training, Stair training, Taping, Dry Needling, Joint mobilization, Spinal mobilization, DME instructions, Cryotherapy, and Moist heat  PLAN FOR NEXT SESSION: Will see pt in pool up to 8 visits and land up to 8 visits. She will be having wrist surgery in one month. Land to assess shoulder  Dessie Coma, PT  Lorayne Bender PT DPT  This entire session was performed under direct supervision and direction of a licensed therapist/therapist assistant . I have personally read, edited and approve of the note as written.  01/12/2023, 8:22 AM

## 2023-01-13 ENCOUNTER — Ambulatory Visit (HOSPITAL_BASED_OUTPATIENT_CLINIC_OR_DEPARTMENT_OTHER): Payer: 59 | Admitting: Physical Therapy

## 2023-01-13 DIAGNOSIS — M25562 Pain in left knee: Secondary | ICD-10-CM | POA: Diagnosis not present

## 2023-01-13 DIAGNOSIS — M6281 Muscle weakness (generalized): Secondary | ICD-10-CM | POA: Diagnosis not present

## 2023-01-13 DIAGNOSIS — R2689 Other abnormalities of gait and mobility: Secondary | ICD-10-CM

## 2023-01-13 DIAGNOSIS — M25551 Pain in right hip: Secondary | ICD-10-CM

## 2023-01-13 DIAGNOSIS — G8929 Other chronic pain: Secondary | ICD-10-CM | POA: Diagnosis not present

## 2023-01-13 DIAGNOSIS — M25561 Pain in right knee: Secondary | ICD-10-CM | POA: Diagnosis not present

## 2023-01-13 NOTE — Therapy (Signed)
OUTPATIENT PHYSICAL THERAPY LOWER EXTREMITY TREATMENT   Patient Name: [name] MRN: [id number] DOB:19-Sep-1965, 57 y.o., female Today's Date: 01/13/2023  END OF SESSION:  PT End of Session - 01/13/23 1717     Visit Number 8    Number of Visits 16    Date for PT Re-Evaluation 02/04/23    Authorization Type [employer]    PT Start Time 1633    PT Stop Time 1700    PT Time Calculation (min) 27 min    Activity Tolerance Patient limited by pain    Behavior During Therapy WFL for tasks assessed/performed               Past Medical History:  Diagnosis Date   ALLERGIC RHINITIS    takes Zyrtec daily   Anxiety    Arthritis    Asthma    Albuterol prn;Symbicort daily and Singulair daily   Bronchitis    hx of   CAP (community acquired pneumonia) 02/20/2015   Carpal tunnel syndrome    right   Chronic back pain    Constipation    Miralax prn   Depression    takes Cymbalta daily   Diabetes (HCC)    type 2    Eczema    Gallstones 2010   GERD (gastroesophageal reflux disease)    takes Omeprazole daily   Hemorrhoids    Hypertension    takes Prinizide daily and Clonidine on Mondays   Hypoventilation associated with obesity syndrome (HCC)    Insomnia    Irritable bladder    Joint pain    Morbid (severe) obesity due to excess calories (HCC) 04/10/2007   Restrictive changes on pfts 11/30/2007     Myasthenia gravis 1994   Positive Acetylcholine receptor Ab and single fiber EMG Dr. Clarisa [facility]- Dr. 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 Coralyn Helling- 10/30/2007 ANA positive, ANA titer   negative, RF less than 20   Rheumatoid arthritis(714.0)    Sarcoidosis 12/23/2010   Derm  dx NCG  12/03/10 (path report in EPIC)    Overview:  Overview:  Diagnosed on skin biopsy November 2012  Last Assessment & Plan:  Labs are unremarkable. CXR improved/stable. PFT wnl. I am happy patient had these tests and we have a baseline for her. At this time, I do not see an indication for starting chronic steroids daily. Patient agrees. Her skin lesions are bothering her the most and syst   Shortness of breath    can be sitting as well as exertion   Skin irritation    skin itchy   Trigger finger    Urinary frequency    takes Ditropan daily   Viral meningitis    history of viral meningitis   Past Surgical History:  Procedure Laterality Date   BRONCHOSCOPY  08/2007   CARPAL TUNNEL RELEASE  05/20/2011   Procedure: CARPAL TUNNEL RELEASE;  Surgeon: [name], MD;  Location: [facility] OR;  Service: Orthopedics;  Laterality: Right;   CARPAL TUNNEL RELEASE Right 11/22/2019   Procedure: RIGHT HAND  CARPAL TUNNEL RELEASE;  Surgeon: Jones Broom, MD;  Location: [facility] ORS;  Service: Orthopedics;  Laterality: Right;   CARPAL TUNNEL RELEASE Left 02/25/2022   Procedure: CARPAL TUNNEL RELEASE;  Surgeon: Jones Broom, MD;  Location: [facility] ORS;  Service: Orthopedics;  Laterality: Left;   CESAREAN SECTION  09/15/2004   COLONOSCOPY N/A 01/21/2014   Procedure: COLONOSCOPY;  Surgeon: Iva Boop, MD;  Location: [facility] ENDOSCOPY;  Service: Endoscopy;  Laterality: N/A;   cortisone injection     receives an injection every 3months   ESOPHAGOGASTRODUODENOSCOPY     ESOPHAGOGASTRODUODENOSCOPY N/A 01/21/2014   Procedure: ESOPHAGOGASTRODUODENOSCOPY (EGD);  Surgeon: Iva Boop, MD;  Location: [facility];  Service: Endoscopy;  Laterality: N/A;   LAPAROSCOPIC CHOLECYSTECTOMY  07/2008   by Dr. Cyndia Bent   Thymus resection  08/25/1993   TRIGGER FINGER RELEASE  05/20/2011   Procedure: RELEASE TRIGGER  FINGER/A-1 PULLEY;  Surgeon: [name], MD;  Location: [facility] OR;  Service: Orthopedics;  Laterality: Right;  RIGHT TRIGGER THUMB RELEASE AND RIGHT CARPAL TUNNEL RELEASE   Patient Active Problem List   Diagnosis Date Noted   Weight loss advised 03/07/2022   Drug-induced constipation 12/09/2021   Anxiety 09/23/2021   Chronic pain of both shoulders 03/24/2021   Left wrist pain 03/24/2021   Diabetic neuropathy (HCC) 02/25/2021   Healthcare maintenance 10/21/2020   Hyperlipidemia (primary prevention, LDL goal <70) 09/04/2020   Wheelchair dependence 05/05/2020   Carpal tunnel syndrome of right wrist, RECURRENT 10/15/2019   Insomnia, idiopathic 09/30/2018   Grade I diastolic dysfunction 09/30/2018   Encounter for chronic pain management 02/11/2014   Diabetes (HCC) 02/11/2014   Cystocele 09/14/2012   Chronic pain of both knees 05/28/2010   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   Allergic rhinitis 05/30/2008   Carpal tunnel syndrome of left wrist 01/17/2008   OBESITY HYPOVENTILATION SYNDROME 12/27/2007   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   Obstructive sleep apnea 10/30/2007   BMI 50.0-59.9, adult (HCC) 04/10/2007   MYASTHENIA 03/07/2007   Depression, recurrent (HCC) 03/24/2006   Essential (primary) hypertension 03/24/2006   Gastro-esophageal reflux disease without esophagitis 03/24/2006    PCP: Vonna Drafts, MD   REFERRING PROVIDER: Nestor Ramp, MD   REFERRING DIAG: Right hip pain  Chronic left shoulder pain       THERAPY DIAG:  Muscle weakness (generalized)  Other abnormalities of gait and mobility  Chronic pain of left knee  Chronic pain of right knee  Pain in right hip  Rationale for Evaluation and Treatment: Rehabilitation  ONSET DATE: 1 yr  SUBJECTIVE:   SUBJECTIVE STATEMENT: The patient reports she plans to begin going to [facility], where she went before.  Hasn't been back there yet; needs to work on going when it is cold  outside.     PERTINENT HISTORY: Evaluate and treat for left rotator cuff tendinopathy.  Bilateral knee pain x 30 yrs.  Recently completed 8 months of therapy aquatic and land based which ended in Aug for knee pain DC for reaching max potential PAIN:  Are you having pain? Yes: NPRS scale: current 10/10;  Pain location: R hip, bilat knees Pain description: sharp and achy Aggravating factors: Sitting Relieving factors: shifting weight off of   PRECAUTIONS: None  RED FLAGS: None   WEIGHT BEARING RESTRICTIONS: No  FALLS:  Has patient fallen in last 6 months? No  LIVING ENVIRONMENT: Lives with: lives with their family Lives in: House/apartment   OCCUPATION: not working  PLOF: Independent with household  mobility with device  PATIENT GOALS: get rid of pain  NEXT MD VISIT: 4 weeks  OBJECTIVE:  Note: Objective measures were completed at Evaluation unless otherwise noted.  DIAGNOSTIC FINDINGS: No hip diagnostic found in chart  10/29/22 [place] left AC joint and part of the rotator cuff musculature  Conclusion:  Acromioclavicular joint arthropathy.   Intact supraspinatus muscle of the rotator cuff.  Limited exam.   CLINICAL DATA:  57 year old female with a history of chronic right knee pain   EXAM:   RIGHT KNEE 3 VIEWS   COMPARISON:  08/03/2013   FINDINGS: No acute displaced fracture. Advanced joint space narrowing of the mediolateral compartment with marginal osteophyte formation, sclerotic changes. Degenerative changes of the patellofemoral joint. Changes have not progressed since the comparison of 08/03/2013   IMPRESSION: Advanced tricompartmental osteoarthritis, worse than the comparison study of 2015.   No acute bony abnormality identified.     Electronically Signed   By: Gilmer Mor D.O.   On: 11/09/2017 09:34  PATIENT SURVEYS:  FOTO Hip primary score 9%; risk adjusted 40%, goal 36%  COGNITION: Overall cognitive status: Within functional limits  for tasks assessed                         SENSATION: Not tested   MUSCLE LENGTH: UTA due to pain levels   POSTURE:  UTA due to poor standing tolerance   PALPATION: Unable to complete due to body habitus  LOWER EXTREMITY ROM:   Difficult to test due to immobility and body habitus Active ROM Right eval Left eval  Hip flexion ~90d ~90  Hip extension ~ -40d ~ -40  Hip abduction    Hip adduction    Hip internal rotation    Hip external rotation    Knee flexion    Knee extension    Ankle dorsiflexion    Ankle plantarflexion    Ankle inversion    Ankle eversion     (Blank rows = not tested)  LOWER EXTREMITY MMT:   Limited by Pain MMT Right eval Left eval  Hip flexion 3 3  Hip extension    Hip abduction 3 3  Hip adduction    Hip internal rotation    Hip external rotation    Knee flexion 3 3  Knee extension 3 3  Ankle dorsiflexion    Ankle plantarflexion    Ankle inversion    Ankle eversion     (Blank rows = not tested)   FUNCTIONAL TESTS:  Timed up and go (TUG): 30.39 30 sec STS 2  GAIT: Distance walked: 10 ft Assistive device utilized: Single point cane and shower chair Level of assistance: Modified independence Comments:  forward flexed position trunk, hips and knees, feet scuffing  Patient using power chair for mobility    TODAY'S TREATMENT:  01/13/23 Pt seen for aquatic therapy today.  Treatment took place in water 3.5-4.75 ft in depth at the [facility] pool. Temp of water was 91.  Pt entered/exited the pool via stairs in step-to pattern with bilat rail. *UE on blue hand floats: walking forward/ backward ; side stepping  * UE on wall:  leg swings into hip flex/ext x 10; into hip abdct/ addct x 10; LE circles x 10 clockwise/ ccw  * staggered stance with horiz abdct/ addct (increased pain with RLE in back) * seated on 3rd step with UE on rails: cycling, alternating LAQ, hip abdct/ addct/   Pt requires the buoyancy and  hydrostatic pressure  of water for support, and to offload joints by unweighting joint load by at least 50 % in navel deep water and by at least 75-80% in chest to neck deep water.  Viscosity of the water is needed for resistance of strengthening. Water current perturbations provides challenge to standing balance requiring increased core activation.    12/17 Nu-step 5 min L3 reviewed set up and use 236 steps   Seated UE: yellow band   Bil ER 2x10  Horizontal abduction 2x10  Fleixon with abduction 2x10   Seated LE:  Heel raise 2x10  Hip abduction 2x10 yellow  Seated march in low low range 2x10      PATIENT EDUCATION:  Education details: aquatic exercise progression/modification Person educated: Patient Education method: Explanation Education comprehension: verbalized understanding  HOME EXERCISE PROGRAM: Land: Access Code: [email or web address] URL: [email or web address] Date: 01/12/2023 Prepared by: Lorayne Bender   ASSESSMENT:  CLINICAL IMPRESSION: Session shortened due to pt's late arrival.  Reviewed previous exercises; required cues to decrease height of leg and to decrease step length with gait.  Pt did not tolerate staggered stance well; increased pain.  Encouraged pt to seek membership to [facility] in coming days to assist with transition to independent aquatic HEP. Pt has met STG1 and partially met STG #4.  Therapist to check remaining STG next session and create aquatic HEP.     Eval: Patient is a 57 y.o. f who was seen today for physical therapy evaluation and treatment for right hip pain and bilat shoulder pain.  Shoulder to be assessed at separate visit with land based therapist. Pt is very well known to our and other [facility] clinics.  She has multiple co morbidities that she has been seen for which are chronic over the past few years.  She reports non-compliance of any HEP with her previous Therapy interludes most recent Dc just 09/23/22 (6 weeks). Discharge completed due to lack of progress/pt  having met her max potential. New dx being seen today is hip pain R>L, no dignostics in chart.  She reports sitting majority of day walking only between rooms "some".  No other physical activity.  Knee contractures persist, hip flex contractures present likely due to her constant seated positioning. Muscle testing indicates no increase or decrease in LE strength since Aug DC.  Pt instructed on the importance of movement and completion of HEP in order to be able to manage her chronic conditions.  Also she is educated on her own accountability to participate in her wellness. She is given hand out list of pools in area and VU that we will be see her in aquatics until she is indep with her HEP then will progress to land based as approp. She also VU that once she reaches her max potential with land based therapy it is her responsibility to follow through with assigned HEPs.  She will benefit from skilled aquatic therapy intervention for ROM/stretching and strengthening of LE and core and land based for shoulder intervention.  OBJECTIVE IMPAIRMENTS: Abnormal gait, decreased activity tolerance, decreased balance, decreased endurance, decreased knowledge of condition, decreased knowledge of use of DME, decreased mobility, difficulty walking, decreased ROM, decreased strength, obesity, and pain.   ACTIVITY LIMITATIONS: carrying, lifting, standing, squatting, stairs, and transfers    PERSONAL FACTORS: Age, Behavior pattern, Fitness, Past/current experiences, Time since onset of injury/illness/exacerbation, and 1 comorbidity: obesity  are also affecting patient's functional outcome.   REHAB POTENTIAL:  Fair based on chronicity, unsuccessful conservative interventions and advanced OA  of B knees   CLINICAL DECISION MAKING: Evolving/moderate complexity  EVALUATION COMPLEXITY: Moderate   GOALS: Goals reviewed with patient? Yes  SHORT TERM GOALS: Target date: 12/31/22 Pt will tolerate full aquatic sessions  consistently without increase in pain and with improving function to demonstrate good toleration and effectiveness of intervention.  Baseline: Goal status: MET - 01/13/23  2.  Pt will report initiation to gain pool access for completion of aquatic HEP to be assigned Baseline:  Goal status: In progress  - plans to return to [facility] 01/13/23  3.  Pt will report compliance with land based HEP as assigned for LE and shoulders Baseline:  Goal status: INITIAL  4.  Pt shoulders will be assessed by land based therapist and treatment initiated Baseline:  Goal status: Partially met -01/11/23  5.  Pt will complete 10 consecutive STS from bench onto water step with or without UE support Baseline:  Goal status: INITIAL   LONG TERM GOALS: Target date: 1/10  Pt to meet stated Foto Goal of 36% Baseline: Hip primary score 9%; risk adjusted 40% Goal status: INITIAL  2.  Pt will be indep with final HEP's (land and aquatic as appropriate) for continued management of condition Baseline:  Goal status: INITIAL  3.  Pt will improve strength in all areas listed by 10lbs or 1 grade to demonstrate improved overall physical function Baseline:  Goal status: INITIAL  4.    Pt will improve hip extension to neutral Baseline:~ -40d bilat Goal status: INITIAL    PLAN:  PT FREQUENCY: 1-2x/week  PT DURATION: 12 weeks extended 4 weeks for potential surgery. 16 visits: 8 max aquatic.  PLANNED INTERVENTIONS: 97164- PT Re-evaluation, 97110-Therapeutic exercises, 97530- Therapeutic activity, [id number]- Neuromuscular re-education, 97535- Self Care, 40981- Manual therapy, [phone]- Gait training, [phone]- Orthotic Fit/training, [phone]- Aquatic Therapy, 97014- Electrical stimulation (unattended), [phone]- Ionotophoresis 4mg /ml Dexamethasone, Balance training, Stair training, Taping, Dry Needling, Joint mobilization, Spinal mobilization, DME instructions, Cryotherapy, and Moist heat  PLAN FOR NEXT SESSION: Will see pt in pool  up to 8 visits and land up to 8 visits. She will be having wrist surgery in one month. Land to assess shoulder   Mayer Camel, PTA 01/13/23 5:18 PM [facility] [address] [place] [facility] [place], [place], [address] Phone: [phone]   Fax:  [phone]

## 2023-01-17 ENCOUNTER — Ambulatory Visit (HOSPITAL_BASED_OUTPATIENT_CLINIC_OR_DEPARTMENT_OTHER): Payer: 59 | Admitting: Physical Therapy

## 2023-01-27 ENCOUNTER — Other Ambulatory Visit: Payer: Self-pay | Admitting: Student

## 2023-01-27 ENCOUNTER — Other Ambulatory Visit: Payer: Self-pay

## 2023-01-27 ENCOUNTER — Other Ambulatory Visit: Payer: Self-pay | Admitting: Family Medicine

## 2023-01-27 ENCOUNTER — Other Ambulatory Visit (HOSPITAL_BASED_OUTPATIENT_CLINIC_OR_DEPARTMENT_OTHER): Payer: Self-pay

## 2023-01-27 ENCOUNTER — Ambulatory Visit (HOSPITAL_BASED_OUTPATIENT_CLINIC_OR_DEPARTMENT_OTHER): Payer: 59 | Admitting: Physical Therapy

## 2023-01-27 ENCOUNTER — Other Ambulatory Visit (HOSPITAL_COMMUNITY): Payer: Self-pay

## 2023-01-27 DIAGNOSIS — G8929 Other chronic pain: Secondary | ICD-10-CM

## 2023-01-27 DIAGNOSIS — J8410 Pulmonary fibrosis, unspecified: Secondary | ICD-10-CM

## 2023-01-27 DIAGNOSIS — M174 Other bilateral secondary osteoarthritis of knee: Secondary | ICD-10-CM

## 2023-01-27 DIAGNOSIS — E0810 Diabetes mellitus due to underlying condition with ketoacidosis without coma: Secondary | ICD-10-CM

## 2023-01-27 DIAGNOSIS — Z794 Long term (current) use of insulin: Secondary | ICD-10-CM

## 2023-01-27 DIAGNOSIS — I10 Essential (primary) hypertension: Secondary | ICD-10-CM

## 2023-01-27 DIAGNOSIS — R062 Wheezing: Secondary | ICD-10-CM

## 2023-01-27 MED ORDER — MOUNJARO 15 MG/0.5ML ~~LOC~~ SOAJ
15.0000 mg | SUBCUTANEOUS | 0 refills | Status: DC
  Filled 2023-01-27: qty 6, 84d supply, fill #0

## 2023-01-27 MED ORDER — DICLOFENAC SODIUM 50 MG PO TBEC
50.0000 mg | DELAYED_RELEASE_TABLET | Freq: Two times a day (BID) | ORAL | 0 refills | Status: DC | PRN
  Filled 2023-01-27: qty 60, 30d supply, fill #0

## 2023-01-27 MED ORDER — OXYBUTYNIN CHLORIDE 5 MG PO TABS
5.0000 mg | ORAL_TABLET | Freq: Two times a day (BID) | ORAL | 1 refills | Status: DC
  Filled 2023-01-27: qty 60, 30d supply, fill #0
  Filled 2023-03-14: qty 60, 30d supply, fill #1

## 2023-01-27 MED ORDER — ALBUTEROL SULFATE HFA 108 (90 BASE) MCG/ACT IN AERS
2.0000 | INHALATION_SPRAY | RESPIRATORY_TRACT | 12 refills | Status: DC | PRN
  Filled 2023-01-27: qty 6.7, 25d supply, fill #0
  Filled 2023-04-27: qty 6.7, 25d supply, fill #1
  Filled 2023-08-10: qty 6.7, 25d supply, fill #2
  Filled 2023-09-23: qty 6.7, 25d supply, fill #3
  Filled 2023-11-13: qty 6.7, 25d supply, fill #4

## 2023-01-28 ENCOUNTER — Other Ambulatory Visit: Payer: Self-pay

## 2023-02-01 ENCOUNTER — Encounter (HOSPITAL_BASED_OUTPATIENT_CLINIC_OR_DEPARTMENT_OTHER): Payer: Self-pay | Admitting: Physical Therapy

## 2023-02-01 ENCOUNTER — Ambulatory Visit (HOSPITAL_BASED_OUTPATIENT_CLINIC_OR_DEPARTMENT_OTHER): Payer: 59 | Attending: Family Medicine | Admitting: Physical Therapy

## 2023-02-01 DIAGNOSIS — M6281 Muscle weakness (generalized): Secondary | ICD-10-CM | POA: Diagnosis not present

## 2023-02-01 DIAGNOSIS — M25551 Pain in right hip: Secondary | ICD-10-CM

## 2023-02-01 DIAGNOSIS — R2689 Other abnormalities of gait and mobility: Secondary | ICD-10-CM | POA: Diagnosis not present

## 2023-02-01 DIAGNOSIS — M25562 Pain in left knee: Secondary | ICD-10-CM | POA: Diagnosis not present

## 2023-02-01 DIAGNOSIS — M25561 Pain in right knee: Secondary | ICD-10-CM | POA: Diagnosis not present

## 2023-02-01 DIAGNOSIS — G8929 Other chronic pain: Secondary | ICD-10-CM | POA: Diagnosis not present

## 2023-02-01 NOTE — Therapy (Signed)
 OUTPATIENT PHYSICAL THERAPY LOWER EXTREMITY TREATMENT   Patient Name: [name] MRN: [id number] DOB:May 16, 1965, 58 y.o., female Today's Date: 02/02/2023  END OF SESSION:  PT End of Session - 02/02/23 0836     Visit Number 9    Number of Visits 16    Date for PT Re-Evaluation 02/04/23    Authorization Type [employer]    PT Start Time 1606    PT Stop Time 1646    PT Time Calculation (min) 40 min    Activity Tolerance Patient limited by pain    Behavior During Therapy WFL for tasks assessed/performed                Past Medical History:  Diagnosis Date   ALLERGIC RHINITIS    takes Zyrtec  daily   Anxiety    Arthritis    Asthma    Albuterol  prn;Symbicort  daily and Singulair  daily   Bronchitis    hx of   CAP (community acquired pneumonia) 02/20/2015   Carpal tunnel syndrome    right   Chronic back pain    Constipation    Miralax  prn   Depression    takes Cymbalta  daily   Diabetes (HCC)    type 2    Eczema    Gallstones 2010   GERD (gastroesophageal reflux disease)    takes Omeprazole  daily   Hemorrhoids    Hypertension    takes Prinizide daily and Clonidine  on Mondays   Hypoventilation associated with obesity syndrome (HCC)    Insomnia    Irritable bladder    Joint pain    Morbid (severe) obesity due to excess calories (HCC) 04/10/2007   Restrictive changes on pfts 11/30/2007     Myasthenia gravis 1994   Positive Acetylcholine receptor Ab and single fiber EMG Dr. Lynwood Barrio [facility]- Dr. 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 Carolynne Allan- 10/30/2007 ANA positive, ANA titer   negative, RF less than 20   Rheumatoid arthritis(714.0)    Sarcoidosis 12/23/2010   Derm  dx NCG  12/03/10 (path report in EPIC)    Overview:  Overview:  Diagnosed on skin biopsy November 2012  Last Assessment & Plan:  Labs are unremarkable. CXR improved/stable. PFT wnl. I am happy patient had these tests and we have a baseline for her. At this time, I do not see an indication for starting chronic steroids daily. Patient agrees. Her skin lesions are bothering her the most and syst   Shortness of breath    can be sitting as well as exertion   Skin irritation    skin itchy   Trigger finger    Urinary frequency    takes Ditropan  daily   Viral meningitis    history of viral meningitis   Past Surgical History:  Procedure Laterality Date   BRONCHOSCOPY  08/2007   CARPAL TUNNEL RELEASE  05/20/2011   Procedure: CARPAL TUNNEL RELEASE;  Surgeon: Eva Elsie Herring, MD;  Location: [facility] OR;  Service: Orthopedics;  Laterality: Right;   CARPAL TUNNEL RELEASE Right 11/22/2019   Procedure: RIGHT  HAND CARPAL TUNNEL RELEASE;  Surgeon: Dozier Soulier, MD;  Location: [facility] ORS;  Service: Orthopedics;  Laterality: Right;   CARPAL TUNNEL RELEASE Left 02/25/2022   Procedure: CARPAL TUNNEL RELEASE;  Surgeon: Dozier Soulier, MD;  Location: [facility]S;  Service: Orthopedics;  Laterality: Left;   CESAREAN SECTION  09/15/2004   COLONOSCOPY N/A 01/21/2014   Procedure: COLONOSCOPY;  Surgeon: Lupita FORBES Commander, MD;  Location: [facility];  Service: Endoscopy;  Laterality: N/A;   cortisone injection     receives an injection every 3months   ESOPHAGOGASTRODUODENOSCOPY     ESOPHAGOGASTRODUODENOSCOPY N/A 01/21/2014   Procedure: ESOPHAGOGASTRODUODENOSCOPY (EGD);  Surgeon: Lupita FORBES Commander, MD;  Location: [facility];  Service: Endoscopy;  Laterality: N/A;   LAPAROSCOPIC CHOLECYSTECTOMY  07/2008   by Dr. Sherlean Laughter   Thymus resection  08/25/1993   TRIGGER FINGER RELEASE  05/20/2011   Procedure: RELEASE TRIGGER  FINGER/A-1 PULLEY;  Surgeon: Soulier Elsie Dozier, MD;  Location: [facility] OR;  Service: Orthopedics;  Laterality: Right;  RIGHT TRIGGER THUMB RELEASE AND RIGHT CARPAL TUNNEL RELEASE   Patient Active Problem List   Diagnosis Date Noted   Weight loss advised 03/07/2022   Drug-induced constipation 12/09/2021   Anxiety 09/23/2021   Chronic pain of both shoulders 03/24/2021   Left wrist pain 03/24/2021   Diabetic neuropathy (HCC) 02/25/2021   Healthcare maintenance 10/21/2020   Hyperlipidemia (primary prevention, LDL goal <70) 09/04/2020   Wheelchair dependence 05/05/2020   Carpal tunnel syndrome of right wrist, RECURRENT 10/15/2019   Insomnia, idiopathic 09/30/2018   Grade I diastolic dysfunction 09/30/2018   Encounter for chronic pain management 02/11/2014   Diabetes (HCC) 02/11/2014   Cystocele 09/14/2012   Chronic pain of both knees 05/28/2010   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   Allergic rhinitis 05/30/2008   Carpal tunnel syndrome of left wrist 01/17/2008   OBESITY HYPOVENTILATION SYNDROME 12/27/2007   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   Obstructive sleep apnea 10/30/2007   BMI 50.0-59.9, adult (HCC) 04/10/2007   MYASTHENIA 03/07/2007   Depression, recurrent (HCC) 03/24/2006   Essential (primary) hypertension 03/24/2006   Gastro-esophageal reflux disease without esophagitis 03/24/2006    PCP: Romelle Booty, MD   REFERRING PROVIDER: Rosalynn Camie CROME, MD   REFERRING DIAG: Right hip pain  Chronic left shoulder pain       THERAPY DIAG:  Muscle weakness (generalized)  Other abnormalities of gait and mobility  Chronic pain of left knee  Chronic pain of right knee  Pain in right hip  Rationale for Evaluation and Treatment: Rehabilitation  ONSET DATE: 1 yr  SUBJECTIVE:   SUBJECTIVE STATEMENT: The patient has had some soreness since that last visit. She feels like it has been about the same.    PERTINENT HISTORY: Evaluate and  treat for left rotator cuff tendinopathy.  Bilateral knee pain x 30 yrs.  Recently completed 8 months of therapy aquatic and land based which ended in Aug for knee pain DC for reaching max potential PAIN:  Are you having pain? Yes: NPRS scale: current 10/10;  Pain location: R hip, bilat knees Pain description: sharp and achy Aggravating factors: Sitting Relieving factors: shifting weight off of   PRECAUTIONS: None  RED FLAGS: None   WEIGHT BEARING RESTRICTIONS: No  FALLS:  Has patient fallen in last 6 months? No  LIVING ENVIRONMENT: Lives with: lives with their family Lives in: House/apartment   OCCUPATION: not working  PLOF: Independent with household mobility with device  PATIENT GOALS: get rid of pain  NEXT MD VISIT: 4 weeks  OBJECTIVE:  Note: Objective measures were completed at Evaluation unless otherwise noted.  DIAGNOSTIC FINDINGS: No hip diagnostic found in chart  10/29/22 US  left AC joint and part of the rotator cuff musculature  Conclusion:  Acromioclavicular joint arthropathy.   Intact supraspinatus muscle of the rotator cuff.  Limited exam.   CLINICAL DATA:  58 year old female with a history of chronic right knee pain   EXAM:   RIGHT KNEE 3 VIEWS   COMPARISON:  08/03/2013   FINDINGS: No acute displaced fracture. Advanced joint space narrowing of the mediolateral compartment with marginal osteophyte formation, sclerotic changes. Degenerative changes of the patellofemoral joint. Changes have not progressed since the comparison of 08/03/2013   IMPRESSION: Advanced tricompartmental osteoarthritis, worse than the comparison study of 2015.   No acute bony abnormality identified.     Electronically Signed   By: Ami Bellman D.O.   On: 11/09/2017 09:34  PATIENT SURVEYS:  FOTO Hip primary score 9%; risk adjusted 40%, goal 36%  COGNITION: Overall cognitive status: Within functional limits for tasks assessed                          SENSATION: Not tested   MUSCLE LENGTH: UTA due to pain levels   POSTURE:  UTA due to poor standing tolerance   PALPATION: Unable to complete due to body habitus  LOWER EXTREMITY ROM:   Difficult to test due to immobility and body habitus Active ROM Right eval Left eval  Hip flexion ~90d ~90  Hip extension ~ -40d ~ -40  Hip abduction    Hip adduction    Hip internal rotation    Hip external rotation    Knee flexion    Knee extension    Ankle dorsiflexion    Ankle plantarflexion    Ankle inversion    Ankle eversion     (Blank rows = not tested)  LOWER EXTREMITY MMT:   Limited by Pain MMT Right eval Left eval  Hip flexion 3 3  Hip extension    Hip abduction 3 3  Hip adduction    Hip internal rotation    Hip external rotation    Knee flexion 3 3  Knee extension 3 3  Ankle dorsiflexion    Ankle plantarflexion    Ankle inversion    Ankle eversion     (Blank rows = not tested)   FUNCTIONAL TESTS:  Timed up and go (TUG): 30.39 30 sec STS 2  GAIT: Distance walked: 10 ft Assistive device utilized: Single point cane and shower chair Level of assistance: Modified independence Comments:  forward flexed position trunk, hips and knees, feet scuffing  Patient using power chair for mobility    TODAY'S TREATMENT:  1/7 Nu-step 5 min   Bicep curls 3x10 3 lbs  Punches 2lbs 3x10  Shoulder flexion 2lbs 3x10   Ball squeeze 3x10  La 90-45 3x10   Reviewed and updated HEP   Ball roll 5x 10 sec hold  Lateral 5x 10 sec hold   01/13/23 Pt seen for aquatic therapy today.  Treatment took place in water 3.5-4.75 ft in depth at the [facility] pool. Temp of water was 91.  Pt entered/exited the pool via stairs in step-to pattern with bilat rail. *UE on blue hand floats: walking forward/ backward ; side stepping  * UE on wall:  leg swings into hip flex/ext x 10; into hip abdct/ addct x 10; LE circles  x 10 clockwise/ ccw  * staggered stance with horiz abdct/  addct (increased pain with RLE in back) * seated on 3rd step with UE on rails: cycling, alternating LAQ, hip abdct/ addct/   Pt requires the buoyancy and hydrostatic pressure of water for support, and to offload joints by unweighting joint load by at least 50 % in navel deep water and by at least 75-80% in chest to neck deep water.  Viscosity of the water is needed for resistance of strengthening. Water current perturbations provides challenge to standing balance requiring increased core activation.    12/17 Nu-step 5 min L3 reviewed set up and use 236 steps   Seated UE: yellow band   Bil ER 2x10  Horizontal abduction 2x10  Fleixon with abduction 2x10   Seated LE:  Heel raise 2x10  Hip abduction 2x10 yellow  Seated march in low low range 2x10      PATIENT EDUCATION:  Education details: aquatic exercise progression/modification Person educated: Patient Education method: Explanation Education comprehension: verbalized understanding  HOME EXERCISE PROGRAM: Land: Access Code: [email or web address] URL: [email or web address] Date: 01/12/2023 Prepared by: Alm Don   AQUATIC Access Code: [email or web address] URL: [email or web address] Date: 02/01/2023 Prepared by: [facility] [address]   Exercises - walking forward/ backward   - 3 x weekly - Side Stepping  - 3 x weekly - Seated Long Arc Quad  - 3 x weekly - 1-2 sets - 10 reps - Standing Shoulder Horizontal Abduction with Resistance  - 3 x weekly - 1-2 sets - 10 reps - Shoulder abduction -with hand floats (arms at surface/ arms at side)  - 3 x weekly - 1-2 sets - 10 reps - Pool noodle/  hand float pull down to thighs  - 3 x weekly - 1-2 sets - 10 reps - [place] with Step Back, with Hand Floats   - 3 x weekly - 1-2 sets - 10 reps - Forward Backward Leg Swing - hold wall or noodle   - 3 x weekly - 1-2 sets - 10 reps - Leg Swings Side to Side - hold wall or noodle  - 3 x weekly - 1-2 sets -  10 reps - Seated straddle on noodle, holding corner, bicycle legs   - 3 x weekl  ASSESSMENT:  CLINICAL IMPRESSION: Therapy continues to expand the patients program. She tolerated treatment well. She had no significant pain with treatment. We reviewed how to stretch out the patents back. We also added in an UE series and a LE exercise for her current series. We will review plan next visit with potential D/C to HEP.   Eval: Patient is a 58 y.o. f who was seen today for physical therapy evaluation and treatment for right hip pain and bilat shoulder pain.  Shoulder to be assessed at separate visit with land based therapist. Pt is very well known to our and other [facility] clinics.  She has multiple co morbidities that she has been seen for which are chronic over the past few years.  She reports non-compliance of any HEP with her previous Therapy interludes most recent Dc just 09/23/22 (6 weeks). Discharge completed due to lack of progress/pt having met her max potential. New dx being seen today is hip pain R>L, no dignostics in chart.  She reports sitting majority of day walking only between rooms some.  No other physical activity.  Knee contractures persist, hip flex contractures present likely due to her constant seated positioning. Muscle testing indicates  no increase or decrease in LE strength since Aug DC.  Pt instructed on the importance of movement and completion of HEP in order to be able to manage her chronic conditions.  Also she is educated on her own accountability to participate in her wellness. She is given hand out list of pools in area and VU that we will be see her in aquatics until she is indep with her HEP then will progress to land based as approp. She also VU that once she reaches her max potential with land based therapy it is her responsibility to follow through with assigned HEPs.  She will benefit from skilled aquatic therapy intervention for ROM/stretching and strengthening of LE and core  and land based for shoulder intervention.  OBJECTIVE IMPAIRMENTS: Abnormal gait, decreased activity tolerance, decreased balance, decreased endurance, decreased knowledge of condition, decreased knowledge of use of DME, decreased mobility, difficulty walking, decreased ROM, decreased strength, obesity, and pain.   ACTIVITY LIMITATIONS: carrying, lifting, standing, squatting, stairs, and transfers    PERSONAL FACTORS: Age, Behavior pattern, Fitness, Past/current experiences, Time since onset of injury/illness/exacerbation, and 1 comorbidity: obesity  are also affecting patient's functional outcome.   REHAB POTENTIAL:  Fair based on chronicity, unsuccessful conservative interventions and advanced OA of B knees   CLINICAL DECISION MAKING: Evolving/moderate complexity  EVALUATION COMPLEXITY: Moderate   GOALS: Goals reviewed with patient? Yes  SHORT TERM GOALS: Target date: 12/31/22 Pt will tolerate full aquatic sessions consistently without increase in pain and with improving function to demonstrate good toleration and effectiveness of intervention.  Baseline: Goal status: MET - 01/13/23  2.  Pt will report initiation to gain pool access for completion of aquatic HEP to be assigned Baseline:  Goal status: In progress  - plans to return to [facility] 01/13/23  3.  Pt will report compliance with land based HEP as assigned for LE and shoulders Baseline:  Goal status: INITIAL  4.  Pt shoulders will be assessed by land based therapist and treatment initiated Baseline:  Goal status: Partially met -01/11/23  5.  Pt will complete 10 consecutive STS from bench onto water step with or without UE support Baseline:  Goal status: INITIAL   LONG TERM GOALS: Target date: 1/10  Pt to meet stated Foto Goal of 36% Baseline: Hip primary score 9%; risk adjusted 40% Goal status: INITIAL  2.  Pt will be indep with final HEP's (land and aquatic as appropriate) for continued management of  condition Baseline:  Goal status: INITIAL  3.  Pt will improve strength in all areas listed by 10lbs or 1 grade to demonstrate improved overall physical function Baseline:  Goal status: INITIAL  4.    Pt will improve hip extension to neutral Baseline:~ -40d bilat Goal status: INITIAL    PLAN:  PT FREQUENCY: 1-2x/week  PT DURATION: 12 weeks extended 4 weeks for potential surgery. 16 visits: 8 max aquatic.  PLANNED INTERVENTIONS: 97164- PT Re-evaluation, 97110-Therapeutic exercises, 97530- Therapeutic activity, [id number]- Neuromuscular re-education, 97535- Self Care, 02859- Manual therapy, [phone]- Gait training, [phone]- Orthotic Fit/training, [id number]- Aquatic Therapy, 97014- Electrical stimulation (unattended), [phone]- Ionotophoresis 4mg /ml Dexamethasone , Balance training, Stair training, Taping, Dry Needling, Joint mobilization, Spinal mobilization, DME instructions, Cryotherapy, and Moist heat  PLAN FOR NEXT SESSION: Will see pt in pool up to 8 visits and land up to 8 visits. She will be having wrist surgery in one month. Land to assess shoulder   Delon Aquas, PTA 02/02/23 10:05 AM [facility] [phone]  [place], [place], [address] Phone: [phone]   Fax:  [phone]

## 2023-02-02 ENCOUNTER — Encounter (HOSPITAL_BASED_OUTPATIENT_CLINIC_OR_DEPARTMENT_OTHER): Payer: Self-pay | Admitting: Physical Therapy

## 2023-02-03 ENCOUNTER — Ambulatory Visit (HOSPITAL_BASED_OUTPATIENT_CLINIC_OR_DEPARTMENT_OTHER): Payer: 59 | Admitting: Physical Therapy

## 2023-02-08 ENCOUNTER — Ambulatory Visit (HOSPITAL_BASED_OUTPATIENT_CLINIC_OR_DEPARTMENT_OTHER): Payer: 59 | Admitting: Physical Therapy

## 2023-02-10 ENCOUNTER — Ambulatory Visit (HOSPITAL_BASED_OUTPATIENT_CLINIC_OR_DEPARTMENT_OTHER): Payer: 59 | Admitting: Physical Therapy

## 2023-02-15 ENCOUNTER — Ambulatory Visit (HOSPITAL_BASED_OUTPATIENT_CLINIC_OR_DEPARTMENT_OTHER): Payer: 59 | Admitting: Physical Therapy

## 2023-02-17 ENCOUNTER — Ambulatory Visit (HOSPITAL_BASED_OUTPATIENT_CLINIC_OR_DEPARTMENT_OTHER): Payer: 59 | Admitting: Physical Therapy

## 2023-03-14 ENCOUNTER — Other Ambulatory Visit: Payer: Self-pay

## 2023-04-08 ENCOUNTER — Other Ambulatory Visit (HOSPITAL_COMMUNITY): Payer: Self-pay

## 2023-04-12 ENCOUNTER — Other Ambulatory Visit (HOSPITAL_COMMUNITY): Payer: Self-pay

## 2023-04-12 DIAGNOSIS — H04129 Dry eye syndrome of unspecified lacrimal gland: Secondary | ICD-10-CM | POA: Diagnosis not present

## 2023-04-12 DIAGNOSIS — E119 Type 2 diabetes mellitus without complications: Secondary | ICD-10-CM | POA: Diagnosis not present

## 2023-04-12 MED ORDER — CYCLOSPORINE 0.05 % OP EMUL
1.0000 [drp] | Freq: Two times a day (BID) | OPHTHALMIC | 3 refills | Status: DC
Start: 2023-04-12 — End: 2023-06-24
  Filled 2023-04-12: qty 5.5, 27d supply, fill #0
  Filled 2023-04-12: qty 5.5, 25d supply, fill #0
  Filled 2023-04-27 – 2023-05-20 (×2): qty 5.5, 27d supply, fill #1

## 2023-04-27 ENCOUNTER — Other Ambulatory Visit (HOSPITAL_COMMUNITY): Payer: Self-pay

## 2023-04-27 ENCOUNTER — Other Ambulatory Visit: Payer: Self-pay

## 2023-04-27 ENCOUNTER — Other Ambulatory Visit: Payer: Self-pay | Admitting: Family Medicine

## 2023-04-27 DIAGNOSIS — Z794 Long term (current) use of insulin: Secondary | ICD-10-CM

## 2023-04-27 DIAGNOSIS — E0810 Diabetes mellitus due to underlying condition with ketoacidosis without coma: Secondary | ICD-10-CM

## 2023-04-27 DIAGNOSIS — G8929 Other chronic pain: Secondary | ICD-10-CM

## 2023-04-27 DIAGNOSIS — I10 Essential (primary) hypertension: Secondary | ICD-10-CM

## 2023-04-27 DIAGNOSIS — M174 Other bilateral secondary osteoarthritis of knee: Secondary | ICD-10-CM

## 2023-04-27 MED ORDER — DICLOFENAC SODIUM 50 MG PO TBEC
50.0000 mg | DELAYED_RELEASE_TABLET | Freq: Two times a day (BID) | ORAL | 0 refills | Status: DC | PRN
  Filled 2023-04-27: qty 60, 30d supply, fill #0

## 2023-04-27 MED ORDER — LANTUS SOLOSTAR 100 UNIT/ML ~~LOC~~ SOPN: 30.0000 [IU] | PEN_INJECTOR | Freq: Every day | SUBCUTANEOUS | Status: DC

## 2023-04-27 MED ORDER — OXYBUTYNIN CHLORIDE 5 MG PO TABS
5.0000 mg | ORAL_TABLET | Freq: Two times a day (BID) | ORAL | 1 refills | Status: DC
  Filled 2023-04-27: qty 60, 30d supply, fill #0
  Filled 2023-06-28: qty 60, 30d supply, fill #1

## 2023-04-27 MED ORDER — MOUNJARO 15 MG/0.5ML ~~LOC~~ SOAJ
15.0000 mg | SUBCUTANEOUS | 0 refills | Status: DC
  Filled 2023-04-27: qty 6, 84d supply, fill #0

## 2023-05-20 ENCOUNTER — Other Ambulatory Visit: Payer: Self-pay | Admitting: Family Medicine

## 2023-05-20 ENCOUNTER — Other Ambulatory Visit (HOSPITAL_COMMUNITY): Payer: Self-pay

## 2023-05-20 DIAGNOSIS — G8929 Other chronic pain: Secondary | ICD-10-CM

## 2023-05-20 DIAGNOSIS — Z794 Long term (current) use of insulin: Secondary | ICD-10-CM

## 2023-05-20 DIAGNOSIS — E0810 Diabetes mellitus due to underlying condition with ketoacidosis without coma: Secondary | ICD-10-CM

## 2023-05-20 DIAGNOSIS — I10 Essential (primary) hypertension: Secondary | ICD-10-CM

## 2023-05-20 DIAGNOSIS — E1165 Type 2 diabetes mellitus with hyperglycemia: Secondary | ICD-10-CM

## 2023-05-20 DIAGNOSIS — M174 Other bilateral secondary osteoarthritis of knee: Secondary | ICD-10-CM

## 2023-05-21 ENCOUNTER — Other Ambulatory Visit (HOSPITAL_COMMUNITY): Payer: Self-pay

## 2023-05-21 ENCOUNTER — Other Ambulatory Visit: Payer: Self-pay

## 2023-05-23 ENCOUNTER — Other Ambulatory Visit (HOSPITAL_COMMUNITY): Payer: Self-pay

## 2023-05-23 ENCOUNTER — Other Ambulatory Visit: Payer: Self-pay | Admitting: Family Medicine

## 2023-05-23 ENCOUNTER — Other Ambulatory Visit: Payer: Self-pay

## 2023-05-23 DIAGNOSIS — E1169 Type 2 diabetes mellitus with other specified complication: Secondary | ICD-10-CM

## 2023-05-23 MED ORDER — ATORVASTATIN CALCIUM 40 MG PO TABS
80.0000 mg | ORAL_TABLET | Freq: Every day | ORAL | 3 refills | Status: DC
  Filled 2023-05-23: qty 180, 90d supply, fill #0
  Filled 2023-09-13: qty 180, 90d supply, fill #1
  Filled 2023-12-15: qty 180, 90d supply, fill #2

## 2023-05-23 MED ORDER — LANTUS SOLOSTAR 100 UNIT/ML ~~LOC~~ SOPN
30.0000 [IU] | PEN_INJECTOR | Freq: Every day | SUBCUTANEOUS | 3 refills | Status: DC
  Filled 2023-05-23: qty 27, 90d supply, fill #0
  Filled 2023-11-13: qty 27, 90d supply, fill #1

## 2023-05-23 MED ORDER — AMLODIPINE BESYLATE 5 MG PO TABS
5.0000 mg | ORAL_TABLET | Freq: Every evening | ORAL | 3 refills | Status: DC
  Filled 2023-05-23 – 2023-08-10 (×3): qty 90, 90d supply, fill #0
  Filled 2023-11-13: qty 90, 90d supply, fill #1

## 2023-05-23 MED ORDER — MOUNJARO 15 MG/0.5ML ~~LOC~~ SOAJ
15.0000 mg | SUBCUTANEOUS | 0 refills | Status: DC
  Filled 2023-05-23 – 2023-07-20 (×3): qty 6, 84d supply, fill #0

## 2023-05-23 MED ORDER — DICLOFENAC SODIUM 50 MG PO TBEC
50.0000 mg | DELAYED_RELEASE_TABLET | Freq: Two times a day (BID) | ORAL | 0 refills | Status: DC | PRN
  Filled 2023-05-23: qty 60, 30d supply, fill #0

## 2023-05-23 MED ORDER — LANTUS SOLOSTAR 100 UNIT/ML ~~LOC~~ SOPN
30.0000 [IU] | PEN_INJECTOR | Freq: Every day | SUBCUTANEOUS | Status: DC
Start: 2023-05-23 — End: 2023-05-23

## 2023-05-23 NOTE — Telephone Encounter (Signed)
 Previous rx was ordered as " No print." This was not transmitted to pharmacy.   Will forward to PCP to resend, as rx will need to include quantity and number of refills.   Elsie Halo, RN

## 2023-05-23 NOTE — Addendum Note (Signed)
 Addended by: Jaeven Wanzer C on: 05/23/2023 10:06 AM   Modules accepted: Orders

## 2023-05-24 ENCOUNTER — Other Ambulatory Visit (HOSPITAL_COMMUNITY): Payer: Self-pay

## 2023-05-24 ENCOUNTER — Other Ambulatory Visit: Payer: Self-pay

## 2023-05-30 DIAGNOSIS — Z993 Dependence on wheelchair: Secondary | ICD-10-CM | POA: Diagnosis not present

## 2023-05-31 ENCOUNTER — Other Ambulatory Visit (HOSPITAL_COMMUNITY): Payer: Self-pay

## 2023-06-21 ENCOUNTER — Encounter: Payer: Self-pay | Admitting: Family Medicine

## 2023-06-21 ENCOUNTER — Ambulatory Visit (INDEPENDENT_AMBULATORY_CARE_PROVIDER_SITE_OTHER): Admitting: Family Medicine

## 2023-06-21 ENCOUNTER — Other Ambulatory Visit (HOSPITAL_COMMUNITY): Payer: Self-pay

## 2023-06-21 VITALS — BP 127/85 | HR 114 | Ht 63.0 in | Wt 326.6 lb

## 2023-06-21 DIAGNOSIS — Z23 Encounter for immunization: Secondary | ICD-10-CM

## 2023-06-21 DIAGNOSIS — R52 Pain, unspecified: Secondary | ICD-10-CM | POA: Diagnosis not present

## 2023-06-21 DIAGNOSIS — G4730 Sleep apnea, unspecified: Secondary | ICD-10-CM

## 2023-06-21 DIAGNOSIS — Z1231 Encounter for screening mammogram for malignant neoplasm of breast: Secondary | ICD-10-CM

## 2023-06-21 DIAGNOSIS — Z1159 Encounter for screening for other viral diseases: Secondary | ICD-10-CM | POA: Diagnosis not present

## 2023-06-21 DIAGNOSIS — Z794 Long term (current) use of insulin: Secondary | ICD-10-CM | POA: Diagnosis not present

## 2023-06-21 DIAGNOSIS — Z6841 Body Mass Index (BMI) 40.0 and over, adult: Secondary | ICD-10-CM

## 2023-06-21 DIAGNOSIS — E1165 Type 2 diabetes mellitus with hyperglycemia: Secondary | ICD-10-CM

## 2023-06-21 LAB — POCT GLYCOSYLATED HEMOGLOBIN (HGB A1C): HbA1c, POC (controlled diabetic range): 6.8 % (ref 0.0–7.0)

## 2023-06-21 MED ORDER — OXYCODONE HCL 5 MG PO TABS
5.0000 mg | ORAL_TABLET | Freq: Four times a day (QID) | ORAL | 0 refills | Status: DC | PRN
  Filled 2023-06-21 – 2023-06-24 (×2): qty 60, 15d supply, fill #0

## 2023-06-21 NOTE — Progress Notes (Signed)
 t   SUBJECTIVE:   CHIEF COMPLAINT / HPI:    Here for checkup  T2DM -Current medication regimen: Lantus  30 units daily, Mounjaro  15 mg weekly, Jardiance  10 mg daily.  Has Dexcom G7.  Also on Lipitor and irbesartan . She has lost a bit of weight since October. -Home CBGs: Dexcom shows avg glucose 162, 74% in range (23% high, 1% low/very low) -Denies polyuria, polydipsia, abdominal pain, chest pain, shortness of breath -Foot exam: UTD -Eye exam: UTD  Lab Results  Component Value Date   HGBA1C 6.8 06/21/2023   HGBA1C 7.1 (A) 11/05/2022   HGBA1C 7.2 (A) 08/24/2022    Chronic pain - shoulder and knee Saw PT for a bit but stopped due to insurance coverage This did help a bit Follows with [facility] as well, planning for carpal tunnel surgery soon  Due for Pap - most recent 2019 NILM but no transition zone Pt declines today, states she will schedule appt for this  Due for mammogram - most recent 2022, normal   Weight loss - needs BMI 50 to get ortho surgery Has had some benefit from Mounjaro , maxed out on this Currently endorses poor sleep - used to use CPAP but not in a while Endorses poor eating habits - 1 meal a day usually at dinner. Doesn't eat many veggies or fiber  STOP-BANG for sleep apnea: -Snores loudly: yes -Tired, fatigued, sleepy during daytime: yes -Observed to have apneic episodes during sleep: yes -High blood pressure: yes  BMI >35: yes Age >50: yes Female gender: no Neck circumference >40: y  Score (1 point for each positive item): 6  5-8: High risk for moderate/severe OSA   PERTINENT  PMH / PSH: Diabetes, obesity, HTN  OBJECTIVE:   BP 127/85   Pulse (!) 114   Ht 5\' 3"  (1.6 m)   Wt (!) 326 lb 9.6 oz (148.1 kg)   SpO2 100%   BMI 57.85 kg/m    General: NAD, pleasant, able to participate in exam. In wheelchair Cardiac: RRR, no murmurs auscultated Respiratory: CTAB, normal WOB Abdomen: soft, non-tender, non-distended, normoactive bowel  sounds Extremities: warm and well perfused, trace lower extremity edema b/l Skin: warm and dry, no rashes noted Neuro: alert, no obvious focal deficits, speech normal Psych: Normal affect and mood  ASSESSMENT/PLAN:   Assessment & Plan Type 2 diabetes mellitus with hyperglycemia, with long-term current use of insulin  (HCC) Stable, A1c 6.8. CBGs acceptable mostly within range on dexcom.  BMP today Continue current regimen Encounter for screening mammogram for malignant neoplasm of breast Ordered mammogram and provided scheduling info Need for hepatitis C screening test Ordered today Pain Shoulder ,knee, carpal tunnel pain Has been using PRN oxycodone   Also on lyrica  and voltaren  which provide relief Attempted PT and Planning to follow up with ortho soon Suspect this is related to her tachycardia Needs to lose weight in order to get ortho surgery Sleep apnea, unspecified type Ordered sleep study, STOP BANG elevated as noted, feel pt would benefit from CPAP BMI 50.0-59.9, adult (HCC) Sleep study as noted Mounjaro  as noted Discussed nutrition in detail and healthy eating habits Declines nutrition referral  Pt to schedule appt for pap Pneumonia vaccine done today  Edison Gore, MD [facility]

## 2023-06-21 NOTE — Patient Instructions (Addendum)
 Please schedule an appointment for your pap smear  I will let you know if any results from today are abnormal or require treatment. Otherwise I will send a message on Mychart  You are due for a mammogram. I have placed this order and you can schedule with the attached information  Your current weight is 326lbs. Your Most recent weight in October was about 340lbs.

## 2023-06-21 NOTE — Assessment & Plan Note (Addendum)
 Stable, A1c 6.8. CBGs acceptable mostly within range on dexcom.  BMP today Continue current regimen

## 2023-06-21 NOTE — Assessment & Plan Note (Signed)
 Sleep study as noted Mounjaro  as noted Discussed nutrition in detail and healthy eating habits Declines nutrition referral

## 2023-06-22 ENCOUNTER — Ambulatory Visit: Payer: Self-pay | Admitting: Family Medicine

## 2023-06-22 ENCOUNTER — Other Ambulatory Visit: Payer: Self-pay

## 2023-06-22 DIAGNOSIS — M5416 Radiculopathy, lumbar region: Secondary | ICD-10-CM

## 2023-06-22 LAB — HCV INTERPRETATION

## 2023-06-22 LAB — BASIC METABOLIC PANEL WITH GFR
BUN/Creatinine Ratio: 12 (ref 9–23)
BUN: 10 mg/dL (ref 6–24)
CO2: 23 mmol/L (ref 20–29)
Calcium: 8.8 mg/dL (ref 8.7–10.2)
Chloride: 102 mmol/L (ref 96–106)
Creatinine, Ser: 0.84 mg/dL (ref 0.57–1.00)
Glucose: 106 mg/dL — ABNORMAL HIGH (ref 70–99)
Potassium: 3.8 mmol/L (ref 3.5–5.2)
Sodium: 143 mmol/L (ref 134–144)
eGFR: 81 mL/min/{1.73_m2} (ref >59)

## 2023-06-22 LAB — HCV AB W REFLEX TO QUANT PCR: HCV Ab: NONREACTIVE

## 2023-06-24 ENCOUNTER — Other Ambulatory Visit: Payer: Self-pay

## 2023-06-24 ENCOUNTER — Ambulatory Visit: Payer: Self-pay | Admitting: Physician Assistant

## 2023-06-24 ENCOUNTER — Other Ambulatory Visit (HOSPITAL_COMMUNITY): Payer: Self-pay

## 2023-06-24 ENCOUNTER — Ambulatory Visit: Admission: EM | Admit: 2023-06-24 | Discharge: 2023-06-24 | Disposition: A | Discharge status: Home / Self Care

## 2023-06-24 ENCOUNTER — Encounter: Payer: Self-pay | Admitting: Emergency Medicine

## 2023-06-24 ENCOUNTER — Ambulatory Visit (INDEPENDENT_AMBULATORY_CARE_PROVIDER_SITE_OTHER)

## 2023-06-24 DIAGNOSIS — M25551 Pain in right hip: Secondary | ICD-10-CM

## 2023-06-24 MED ORDER — LIDOCAINE 5 % EX PTCH
1.0000 | MEDICATED_PATCH | CUTANEOUS | 0 refills | Status: DC
  Filled 2023-06-24 (×2): qty 30, 30d supply, fill #0

## 2023-06-24 MED ORDER — KETOROLAC TROMETHAMINE 30 MG/ML IJ SOLN
30.0000 mg | Freq: Once | INTRAMUSCULAR | Status: AC
  Administered 2023-06-24: 30 mg via INTRAMUSCULAR

## 2023-06-24 NOTE — ED Provider Notes (Signed)
 [facility]    CSN: [id number] Arrival date & time: 06/24/23  1209      History   Chief Complaint Chief Complaint  Patient presents with   Back Pain   Hip Pain    HPI [name] is a 58 y.o. female.   Patient presents today with a 16-hour history of lower back and right hip pain.  She reports that symptoms began without identifiable trigger and she denies any recent fall, trauma, car accidents.  She does have intermittent lower back pain related to previous falls many years ago and does have a history of chronic back pain listed on her problem list.  She reports that pain is more than 10, described as sharp, unrelenting, no alleviating factors identified.  Denies any numbness or paresthesias in her leg.  She reports that the pain is localized to her hip with radiation into her posterior leg.  She denies any bowel/bladder incontinence increased from baseline (has chronic urinary incontinence), lower extremity weakness, saddle anesthesia.  She denies any fever, nausea, vomiting.  She does have a history of chronic pain and is currently prescribed oxycodone  which she took last night without improvement of symptoms.  She is also prescribed diclofenac  and Lyrica  but these have been ineffective as well.  She does have a history of myasthenia gravis but denies any increased weakness.    Past Medical History:  Diagnosis Date   ALLERGIC RHINITIS    takes Zyrtec  daily   Anxiety    Arthritis    Asthma    Albuterol  prn;Symbicort  daily and Singulair  daily   Bronchitis    hx of   CAP (community acquired pneumonia) 02/20/2015   Carpal tunnel syndrome    right   Chronic back pain    Constipation    Miralax  prn   Depression    takes Cymbalta  daily   Diabetes (HCC)    type 2    Eczema    Gallstones 2010   GERD (gastroesophageal reflux disease)    takes Omeprazole  daily   Hemorrhoids    Hypertension    takes Prinizide daily and Clonidine  on Mondays   Hypoventilation  associated with obesity syndrome (HCC)    Insomnia    Irritable bladder    Joint pain    Morbid (severe) obesity due to excess calories (HCC) 04/10/2007   Restrictive changes on pfts 11/30/2007     Myasthenia gravis 1994   Positive Acetylcholine receptor Ab and single fiber EMG Dr. Chana Comas [facility] 1996- Dr. 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 Wilder Handy- 10/30/2007 ANA positive, ANA titer  negative, RF less than 20   Rheumatoid arthritis(714.0)    Sarcoidosis 12/23/2010   Derm  dx NCG  12/03/10 (path report in EPIC)    Overview:  Overview:  Diagnosed on skin biopsy November 2012  Last Assessment & Plan:  Labs are unremarkable. CXR improved/stable. PFT wnl. I am happy patient had these tests and we have a baseline for her. At this time, I do not see an indication for starting chronic  steroids daily. Patient agrees. Her skin lesions are bothering her the most and syst   Shortness of breath    can be sitting as well as exertion   Skin irritation    skin itchy   Trigger finger    Urinary frequency    takes Ditropan  daily   Viral meningitis    history of viral meningitis    Patient Active Problem List   Diagnosis Date Noted   Weight loss advised 03/07/2022   Drug-induced constipation 12/09/2021   Anxiety 09/23/2021   Chronic pain of both shoulders 03/24/2021   Left wrist pain 03/24/2021   Diabetic neuropathy (HCC) 02/25/2021   Healthcare maintenance 10/21/2020   Hyperlipidemia (primary prevention, LDL goal <70) 09/04/2020   Wheelchair dependence 05/05/2020   Carpal tunnel syndrome of right wrist,  RECURRENT 10/15/2019   Insomnia, idiopathic 09/30/2018   Grade I diastolic dysfunction 09/30/2018   Encounter for chronic pain management 02/11/2014   Diabetes (HCC) 02/11/2014   Type 2 diabetes mellitus without complications (HCC) 02/11/2014   Cystocele 09/14/2012   Sarcoidosis 12/23/2010   Chronic pain of both knees 05/28/2010   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   Allergic rhinitis 05/30/2008   Carpal tunnel syndrome of left wrist 01/17/2008   OBESITY HYPOVENTILATION SYNDROME 12/27/2007   Uncomplicated asthma 12/01/2007   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   Obstructive sleep apnea 10/30/2007   BMI 50.0-59.9, adult (HCC) 04/10/2007   MYASTHENIA 03/07/2007   Depression, recurrent (HCC) 03/24/2006   Essential (primary) hypertension 03/24/2006   Gastro-esophageal reflux disease without esophagitis 03/24/2006   Major depressive disorder, single episode 03/24/2006    Past Surgical History:  Procedure Laterality Date   BRONCHOSCOPY  08/2007   CARPAL TUNNEL RELEASE  05/20/2011   Procedure: CARPAL TUNNEL RELEASE;  Surgeon: Derald Flattery, MD;  Location: [facility] OR;  Service: Orthopedics;  Laterality: Right;   CARPAL TUNNEL RELEASE Right 11/22/2019   Procedure: RIGHT HAND CARPAL TUNNEL RELEASE;  Surgeon: Sammye Cristal, MD;  Location: [facility] ORS;  Service: Orthopedics;  Laterality: Right;   CARPAL TUNNEL RELEASE Left 02/25/2022   Procedure: CARPAL TUNNEL RELEASE;  Surgeon: Sammye Cristal, MD;  Location: [facility];  Service: Orthopedics;  Laterality: Left;   CESAREAN SECTION  09/15/2004   COLONOSCOPY N/A 01/21/2014   Procedure: COLONOSCOPY;  Surgeon: Kenney Peacemaker, MD;  Location: [facility];  Service: Endoscopy;  Laterality: N/A;   cortisone injection     receives an injection every 3months   ESOPHAGOGASTRODUODENOSCOPY     ESOPHAGOGASTRODUODENOSCOPY N/A 01/21/2014   Procedure: ESOPHAGOGASTRODUODENOSCOPY (EGD);  Surgeon: Kenney Peacemaker, MD;   Location: [facility];  Service: Endoscopy;  Laterality: N/A;   LAPAROSCOPIC CHOLECYSTECTOMY  07/2008   by Dr. Burma Carrel   Thymus resection  08/25/1993   TRIGGER FINGER RELEASE  05/20/2011   Procedure: RELEASE TRIGGER FINGER/A-1 PULLEY;  Surgeon: Derald Flattery, MD;  Location: [facility] OR;  Service: Orthopedics;  Laterality: Right;  RIGHT TRIGGER THUMB RELEASE AND RIGHT CARPAL TUNNEL RELEASE    OB History   No obstetric history on file.      Home Medications    Prior to Admission medications   Medication Sig Start Date End Date Taking? Authorizing Provider  amLODipine  (NORVASC ) 5 MG tablet Take 1 tablet (5 mg total) by mouth at bedtime. 05/23/23  Yes Edison Gore, MD  atorvastatin  (LIPITOR) 40 MG tablet Take 2 tablets (80 mg total) by mouth daily. 05/23/23  Yes Edison Gore, MD  buPROPion  (WELLBUTRIN  XL) 300 MG  24 hr tablet Take 1 tablet (300 mg total) by mouth daily. 10/07/22  Yes Clem Currier, DO  cetirizine  (ZYRTEC ) 10 MG tablet Take 1 tablet (10 mg total) by mouth daily. 10/07/22  Yes Clem Currier, DO  Continuous Glucose Sensor (DEXCOM G7 SENSOR) MISC Apply 1 sensor every 10 days 08/24/22  Yes Edison Gore, MD  DENTA 5000 PLUS 1.1 % CREA dental cream Take by mouth as directed. 03/28/23  Yes [provider]  diclofenac  (VOLTAREN ) 50 MG EC tablet Take 1 tablet (50 mg total) by mouth 2 (two) times daily as needed. 05/23/23  Yes Edison Gore, MD  empagliflozin  (JARDIANCE ) 10 MG TABS tablet Take 1 tablet by mouth daily. 08/26/22  Yes Edison Gore, MD  hydrochlorothiazide  (HYDRODIURIL ) 25 MG tablet Take 1 tablet (25 mg total) by mouth daily. 10/07/22  Yes Clem Currier, DO  insulin  glargine (LANTUS  SOLOSTAR) 100 UNIT/ML Solostar Pen Inject 30 Units into the skin daily. 05/23/23  Yes Edison Gore, MD  Insulin  Pen Needle (TECHLITE PEN NEEDLES) 31G X 5 MM MISC Use to inject Lantus  Solostar as directed. 06/14/22  Yes Santana Cue, MD  irbesartan  (AVAPRO ) 150 MG tablet Take 1  tablet (150 mg total) by mouth at bedtime. 08/26/22  Yes Edison Gore, MD  lidocaine  (LIDODERM ) 5 % Place 1 patch onto the skin daily. Remove & Discard patch within 12 hours or as directed by MD 06/24/23  Yes Jaielle Dlouhy K, PA-C  montelukast  (SINGULAIR ) 10 MG tablet Take 1 tablet (10 mg total) by mouth at bedtime. 10/07/22  Yes Clem Currier, DO  omeprazole  (PRILOSEC) 40 MG capsule Take 1 capsule (40 mg total) by mouth daily. 10/07/22  Yes Clem Currier, DO  oxybutynin  (DITROPAN ) 5 MG tablet Take 1 tablet (5 mg total) by mouth 2 (two) times daily. 04/27/23  Yes Edison Gore, MD  oxyCODONE  (OXY IR/ROXICODONE ) 5 MG immediate release tablet Take 1 tablet (5 mg total) by mouth every 6 (six) hours as needed for severe pain (pain score 7-10) or breakthrough pain. 06/21/23  Yes Edison Gore, MD  pregabalin  (LYRICA ) 75 MG capsule Take 1 capsule (75 mg total) by mouth 2 (two) times daily. Back down to once daily if you become drowsy. 06/23/22  Yes Santana Cue, MD  tirzepatide  (MOUNJARO ) 15 MG/0.5ML Pen Inject 15 mg into the skin once a week. 05/23/23  Yes Edison Gore, MD  acetaminophen  (TYLENOL ) 500 MG tablet Take 1 tablet (500 mg total) by mouth every 6 (six) hours as needed. 09/22/21   Santana Cue, MD  albuterol  (PROAIR  HFA) 108 [phone] Base) MCG/ACT inhaler Inhale 2 puffs into the lungs every 4 (four) hours as needed. 01/27/23   Jonne Netters, MD  amoxicillin (AMOXIL) 500 MG capsule Take 500 mg by mouth every 8 (eight) hours. Patient not taking: Reported on 06/24/2023 05/09/23   [provider]  Blood Glucose Monitoring Suppl (ACCU-CHEK GUIDE) w/Device KIT Please use to check blood sugar up to four times daily. Patient not taking: Reported on 09/23/2021 11/25/20   Santana Cue, MD  budesonide -formoterol  (SYMBICORT ) 160-4.5 MCG/ACT inhaler Inhale 2 puffs into the lungs 2 (two) times daily. 07/30/22   Edison Gore, MD  chlorhexidine  (PERIDEX ) 0.12 % solution RINSE WITH FOR 30 SECONDS TWICE A  DAY AM & PM FOR ONLY 2 WKS THEN STOP, SPIT OUT, DO NO SWALLOW Patient not taking: Reported on 06/24/2023 03/28/23   [provider]  Continuous Glucose Receiver (DEXCOM G7 RECEIVER) DEVI Use as directed 08/24/22  Edison Gore, MD  cycloSPORINE  (RESTASIS  MULTIDOSE) 0.05 % ophthalmic emulsion Place 1 drop into both eyes 2 (two) times daily. Patient not taking: Reported on 06/24/2023 08/24/22     furosemide  (LASIX ) 20 MG tablet Take 1 tablet (20 mg total) by mouth daily as needed. 06/14/22   Santana Cue, MD  glucose blood (ACCU-CHEK GUIDE) test strip Use to check blood sugar in the morning, at noon and at bedtime as directed Patient not taking: Reported on 06/24/2023 08/26/22   Edison Gore, MD  hydrOXYzine  (ATARAX ) 50 MG tablet Take 1 - 2 tablets (50 - 100 mg total) by mouth 3 times daily as needed for anxiety. 01/05/22   Santana Cue, MD    Family History Family History  Problem Relation Age of Onset   Hypertension Father    Sickle cell trait Father    Diabetes Father        Borderline   Diabetes Maternal Grandmother    Stroke Paternal Grandmother    Sickle cell trait Paternal Grandfather    Diabetes Paternal Grandfather    Crohn's disease Paternal Aunt    Lupus Other        Mother's first cousin   Anesthesia problems Neg Hx    Hypotension Neg Hx    Malignant hyperthermia Neg Hx    Pseudochol deficiency Neg Hx    Colon cancer Neg Hx    Colon polyps Neg Hx    Heart disease Neg Hx    Kidney disease Neg Hx    Esophageal cancer Neg Hx    Gallbladder disease Neg Hx     Social History Social History   Tobacco Use   Smoking status: Never   Smokeless tobacco: Never  Vaping Use   Vaping status: Never Used  Substance Use Topics   Alcohol use: No   Drug use: No     Allergies   Apple juice, Banana, Metformin  and related, and Shrimp [shellfish allergy]   Review of Systems Review of Systems  Constitutional:  Positive for activity change. Negative for appetite  change, fatigue and fever.  Respiratory:  Negative for shortness of breath.   Cardiovascular:  Negative for chest pain.  Gastrointestinal:  Negative for abdominal pain, diarrhea, nausea and vomiting.  Musculoskeletal:  Positive for arthralgias and back pain. Negative for myalgias.  Neurological:  Negative for dizziness, weakness, light-headedness, numbness and headaches.     Physical Exam Triage Vital Signs ED Triage Vitals  Encounter Vitals Group     BP 06/24/23 1251 116/73     Systolic BP Percentile --      Diastolic BP Percentile --      Pulse Rate 06/24/23 1251 (!) 109     Resp 06/24/23 1251 18     Temp 06/24/23 1251 98.5 F (36.9 C)     Temp Source 06/24/23 1251 Oral     SpO2 06/24/23 1251 95 %     Weight --      Height --      Head Circumference --      Peak Flow --      Pain Score 06/24/23 1252 10     Pain Loc --      Pain Education --      Exclude from Growth Chart --    No data found.  Updated Vital Signs BP 123/78 (BP Location: Right Wrist)   Pulse 100   Temp 98.5 F (36.9 C) (Oral)   Resp 20   SpO2 94%   Visual  Acuity Right Eye Distance:   Left Eye Distance:   Bilateral Distance:    Right Eye Near:   Left Eye Near:    Bilateral Near:     Physical Exam Vitals reviewed.  Constitutional:      General: She is awake. She is not in acute distress.    Appearance: Normal appearance. She is well-developed. She is not ill-appearing.     Comments: Very pleasant female appears stated age in no acute distress sitting comfortably in exam room  HENT:     Head: Normocephalic and atraumatic.  Cardiovascular:     Rate and Rhythm: Normal rate and regular rhythm.     Heart sounds: Normal heart sounds, S1 normal and S2 normal. No murmur heard. Pulmonary:     Effort: Pulmonary effort is normal.     Breath sounds: Normal breath sounds. No wheezing, rhonchi or rales.     Comments: Clear to auscultation bilaterally Musculoskeletal:     Cervical back: No tenderness  or bony tenderness.     Thoracic back: No tenderness or bony tenderness.     Lumbar back: Tenderness and bony tenderness present.     Right hip: Tenderness and bony tenderness present. No deformity. Decreased range of motion.     Comments: Back: No pain percussion of vertebrae.  No deformity or step-off noted.  Tender to palpation of right lumbar paraspinal muscles without spasm.  Strength 4/5 bilateral lower extremities.  Right hip: Tenderness palpation over inferior and lateral right hip.  No deformity noted.  Decreased range of motion secondary to pain.  Psychiatric:        Behavior: Behavior is cooperative.      UC Treatments / Results  Labs (all labs ordered are listed, but only abnormal results are displayed) Labs Reviewed - No data to display  EKG   Radiology No results found.  Procedures Procedures (including critical care time)  Medications Ordered in UC Medications  ketorolac  (TORADOL ) 30 MG/ML injection 30 mg (30 mg Intramuscular Given 06/24/23 1443)    Initial Impression / Assessment and Plan / UC Course  I have reviewed the triage vital signs and the nursing notes.  Pertinent labs & imaging results that were available during my care of the patient were reviewed by me and considered in my medical decision making (see chart for details).     Patient is well-appearing, afebrile, nontoxic.  She was initially tachycardic but this improved after sitting quietly for several minutes and I suspect was related to acute pain with trying to get into the room.  X-ray of the hip was obtained given distribution of pain that showed degenerative changes without acute findings based on my primary read.  At the time of discharge we will waiting for radiologist overread and we will contact her if this differs and changes her treatment plan.  Low suspicion for trochanteric bursitis as pain is not specifically in the lateral hip and this area is not particularly tender to palpation.  We  discussed that we are limited in the medications that we can use to treat her pain given she is on multiple medications for chronic pain.  She had not taken her diclofenac  today and so I did offer her a an injection of Toradol .  No indication for dose adjustment based on metabolic panel from 06/21/2023 with creatinine of 0.84 intact with creatinine clearance of 172.49 mL/min.  We discussed that she should not take additional NSAIDs with this medication for the next 24 hours including  her prescribed diclofenac , over-the-counter NSAID such as ibuprofen , aspirin, Aleve .  She can use Tylenol  as needed as well as previously prescribed oxycodone .  Muscle relaxers were deferred as she does have a history of myasthenia gravis and I was concerned that this could exacerbate muscle weakness.  She was given Lidoderm  pain patches and we discussed that she should apply this for 12 hours and then remove it for 12 hours using only 1 patch per 24 hours.  She is to follow-up with an orthopedic provider and was given the contact information for local provider.  We discussed that if anything changes or worsens she should return for reevaluation.  Strict return precautions given.  Excuse note declined.  Final Clinical Impressions(s) / UC Diagnoses   Final diagnoses:  Right hip pain     Discharge Instructions      I am concerned that you have arthritis in your hip that is causing your pain.  We gave an anti-inflammatory medication today (ketorolac  or Toradol ) so please do not take any NSAIDs including prescribed diclofenac /Voltaren , over-the-counter ibuprofen /Advil , naproxen /Aleve , aspirin for 24 hours.  You can use Tylenol  as well as your previously prescribed oxycodone  for pain relief.  I have also cut in lidocaine  patches to help with your symptoms.  Apply this for 12 hours and then remove it for 12 hours.  Please follow-up with orthopedics as soon as possible.  Call them to schedule an appointment.  If anything worsens you  need to be seen immediately.   ED Prescriptions     Medication Sig Dispense Auth. Provider   lidocaine  (LIDODERM ) 5 % Place 1 patch onto the skin daily. Remove & Discard patch within 12 hours or as directed by MD 30 patch Morelia Cassells K, PA-C      PDMP not reviewed this encounter.   Budd Cargo, PA-C 06/24/23 1458

## 2023-06-24 NOTE — Discharge Instructions (Signed)
 I am concerned that you have arthritis in your hip that is causing your pain.  We gave an anti-inflammatory medication today (ketorolac  or Toradol ) so please do not take any NSAIDs including prescribed diclofenac /Voltaren , over-the-counter ibuprofen /Advil , naproxen /Aleve , aspirin for 24 hours.  You can use Tylenol  as well as your previously prescribed oxycodone  for pain relief.  I have also cut in lidocaine  patches to help with your symptoms.  Apply this for 12 hours and then remove it for 12 hours.  Please follow-up with orthopedics as soon as possible.  Call them to schedule an appointment.  If anything worsens you need to be seen immediately.

## 2023-06-24 NOTE — ED Triage Notes (Signed)
 Pt here for R-sided back and hip pain that started yesterday afternoon. No causative factors or accidents prior to start of pain and no recurrent hx of these pains. Pain has continued to be persistent and spreading toward midline of back. Constant aching pain with intermittent sharp pains. Reduced mobility. Pt usually able to walk short distances with her cane, but is now using wheelchair at home since pain started. No relief with her normal scheduled meds (diclofenac  & lyrica ).

## 2023-06-25 ENCOUNTER — Emergency Department (HOSPITAL_COMMUNITY)
Admission: EM | Admit: 2023-06-25 | Discharge: 2023-06-26 | Disposition: A | Discharge status: Home / Self Care | Attending: Emergency Medicine | Admitting: Emergency Medicine

## 2023-06-25 DIAGNOSIS — R200 Anesthesia of skin: Secondary | ICD-10-CM | POA: Diagnosis present

## 2023-06-25 DIAGNOSIS — Z794 Long term (current) use of insulin: Secondary | ICD-10-CM | POA: Diagnosis not present

## 2023-06-25 DIAGNOSIS — E119 Type 2 diabetes mellitus without complications: Secondary | ICD-10-CM | POA: Diagnosis not present

## 2023-06-25 DIAGNOSIS — M5416 Radiculopathy, lumbar region: Secondary | ICD-10-CM | POA: Insufficient documentation

## 2023-06-25 DIAGNOSIS — Z7984 Long term (current) use of oral hypoglycemic drugs: Secondary | ICD-10-CM | POA: Insufficient documentation

## 2023-06-26 ENCOUNTER — Encounter (HOSPITAL_COMMUNITY): Payer: Self-pay | Admitting: *Deleted

## 2023-06-26 ENCOUNTER — Other Ambulatory Visit: Payer: Self-pay

## 2023-06-26 DIAGNOSIS — M5416 Radiculopathy, lumbar region: Secondary | ICD-10-CM | POA: Diagnosis not present

## 2023-06-26 LAB — CBC
HCT: 42 % (ref 36.0–46.0)
Hemoglobin: 13 g/dL (ref 12.0–15.0)
MCH: 26.3 pg (ref 26.0–34.0)
MCHC: 31 g/dL (ref 30.0–36.0)
MCV: 85 fL (ref 80.0–100.0)
Platelets: 336 10*3/uL (ref 150–400)
RBC: 4.94 MIL/uL (ref 3.87–5.11)
RDW: 15.7 % — ABNORMAL HIGH (ref 11.5–15.5)
WBC: 10 10*3/uL (ref 4.0–10.5)
nRBC: 0 % (ref 0.0–0.2)

## 2023-06-26 LAB — URINALYSIS, ROUTINE W REFLEX MICROSCOPIC
Bacteria, UA: NONE SEEN
Bilirubin Urine: NEGATIVE
Glucose, UA: >=500 mg/dL — AB
Hgb urine dipstick: NEGATIVE
Ketones, ur: NEGATIVE mg/dL
Leukocytes,Ua: NEGATIVE
Nitrite: NEGATIVE
Protein, ur: NEGATIVE mg/dL
Specific Gravity, Urine: 1.031 — ABNORMAL HIGH (ref 1.005–1.030)
pH: 5 (ref 5.0–8.0)

## 2023-06-26 LAB — COMPREHENSIVE METABOLIC PANEL WITH GFR
ALT: 16 U/L (ref 0–44)
AST: 27 U/L (ref 15–41)
Albumin: 3.1 g/dL — ABNORMAL LOW (ref 3.5–5.0)
Alkaline Phosphatase: 123 U/L (ref 38–126)
Anion gap: 9 (ref 5–15)
BUN: 7 mg/dL (ref 6–20)
CO2: 26 mmol/L (ref 22–32)
Calcium: 8.7 mg/dL — ABNORMAL LOW (ref 8.9–10.3)
Chloride: 104 mmol/L (ref 98–111)
Creatinine, Ser: 0.84 mg/dL (ref 0.44–1.00)
GFR, Estimated: >60 mL/min (ref >60)
Glucose, Bld: 135 mg/dL — ABNORMAL HIGH (ref 70–99)
Potassium: 3.6 mmol/L (ref 3.5–5.1)
Sodium: 139 mmol/L (ref 135–145)
Total Bilirubin: 0.4 mg/dL (ref 0.0–1.2)
Total Protein: 7.3 g/dL (ref 6.5–8.1)

## 2023-06-26 LAB — LIPASE, BLOOD: Lipase: 24 U/L (ref 11–51)

## 2023-06-26 MED ORDER — LORAZEPAM 1 MG PO TABS: 1.0000 mg | ORAL_TABLET | Freq: Once | ORAL | Status: DC

## 2023-06-26 MED ORDER — KETOROLAC TROMETHAMINE 15 MG/ML IJ SOLN
15.0000 mg | Freq: Once | INTRAMUSCULAR | Status: AC
  Administered 2023-06-26: 15 mg via INTRAMUSCULAR
  Filled 2023-06-26: qty 1

## 2023-06-26 MED ORDER — LORAZEPAM 1 MG PO TABS
1.0000 mg | ORAL_TABLET | Freq: Once | ORAL | Status: AC | PRN
  Administered 2023-06-26: 1 mg via ORAL
  Filled 2023-06-26: qty 1

## 2023-06-26 MED ORDER — DEXAMETHASONE SODIUM PHOSPHATE 10 MG/ML IJ SOLN
10.0000 mg | Freq: Once | INTRAMUSCULAR | Status: AC
  Administered 2023-06-26: 10 mg via INTRAMUSCULAR
  Filled 2023-06-26: qty 1

## 2023-06-26 NOTE — ED Provider Notes (Signed)
 [place] EMERGENCY DEPARTMENT AT [facility] Provider Note   CSN: [id number] Arrival date & time: 06/25/23  2306     History  No chief complaint on file.   [name] is a 58 y.o. female.  Patient with past medical history significant for type II DM, obesity, depression, myasthenia gravis, chronic back pain presents to the Emergency Department complaining of 3 days of right leg pain radiating from the upper leg down to the level of the foot.  She states she was at urgent care yesterday was treated with Toradol  with some mild relief of symptoms.  She states she is here this morning because she now has numbness in the posterior leg and the lateral lower leg.  She denies urinary incontinence, fecal incontinence, urinary retention, saddle anesthesia, difficulty breathing, other muscular weakness or sensation deficits.  HPI     Home Medications Prior to Admission medications   Medication Sig Start Date End Date Taking? Authorizing Provider  acetaminophen  (TYLENOL ) 500 MG tablet Take 1 tablet (500 mg total) by mouth every 6 (six) hours as needed. 09/22/21   Santana Cue, MD  albuterol  (PROAIR  HFA) 108 [phone] Base) MCG/ACT inhaler Inhale 2 puffs into the lungs every 4 (four) hours as needed. 01/27/23   Jonne Netters, MD  amLODipine  (NORVASC ) 5 MG tablet Take 1 tablet (5 mg total) by mouth at bedtime. 05/23/23   Edison Gore, MD  amoxicillin (AMOXIL) 500 MG capsule Take 500 mg by mouth every 8 (eight) hours. Patient not taking: Reported on 06/24/2023 05/09/23   [provider]  atorvastatin  (LIPITOR) 40 MG tablet Take 2 tablets (80 mg total) by mouth daily. 05/23/23   Edison Gore, MD  Blood Glucose Monitoring Suppl (ACCU-CHEK GUIDE) w/Device KIT Please use to check blood sugar up to four times daily. Patient not taking: Reported on 09/23/2021 11/25/20   Santana Cue, MD  budesonide -formoterol  (SYMBICORT ) 160-4.5 MCG/ACT inhaler Inhale 2 puffs into the lungs 2  (two) times daily. 07/30/22   Edison Gore, MD  buPROPion  (WELLBUTRIN  XL) 300 MG 24 hr tablet Take 1 tablet (300 mg total) by mouth daily. 10/07/22   Clem Currier, DO  cetirizine  (ZYRTEC ) 10 MG tablet Take 1 tablet (10 mg total) by mouth daily. 10/07/22   Clem Currier, DO  chlorhexidine  (PERIDEX ) 0.12 % solution RINSE WITH FOR 30 SECONDS TWICE A DAY AM & PM FOR ONLY 2 WKS THEN STOP, SPIT OUT, DO NO SWALLOW Patient not taking: Reported on 06/24/2023 03/28/23   [provider]  Continuous Glucose Receiver (DEXCOM G7 RECEIVER) DEVI Use as directed 08/24/22   Edison Gore, MD  Continuous Glucose Sensor (DEXCOM G7 SENSOR) MISC Apply 1 sensor every 10 days 08/24/22   Edison Gore, MD  cycloSPORINE  (RESTASIS  MULTIDOSE) 0.05 % ophthalmic emulsion Place 1 drop into both eyes 2 (two) times daily. Patient not taking: Reported on 06/24/2023 08/24/22     DENTA 5000 PLUS 1.1 % CREA dental cream Take by mouth as directed. 03/28/23   [provider]  diclofenac  (VOLTAREN ) 50 MG EC tablet Take 1 tablet (50 mg total) by mouth 2 (two) times daily as needed. 05/23/23   Edison Gore, MD  empagliflozin  (JARDIANCE ) 10 MG TABS tablet Take 1 tablet by mouth daily. 08/26/22   Edison Gore, MD  furosemide  (LASIX ) 20 MG tablet Take 1 tablet (20 mg total) by mouth daily as needed. 06/14/22   Santana Cue, MD  glucose blood (ACCU-CHEK GUIDE) test strip Use to check blood  sugar in the morning, at noon and at bedtime as directed Patient not taking: Reported on 06/24/2023 08/26/22   Edison Gore, MD  hydrochlorothiazide  (HYDRODIURIL ) 25 MG tablet Take 1 tablet (25 mg total) by mouth daily. 10/07/22   Clem Currier, DO  hydrOXYzine  (ATARAX ) 50 MG tablet Take 1 - 2 tablets (50 - 100 mg total) by mouth 3 times daily as needed for anxiety. 01/05/22   Santana Cue, MD  insulin  glargine (LANTUS  SOLOSTAR) 100 UNIT/ML Solostar Pen Inject 30 Units into the skin daily. 05/23/23   Edison Gore, MD  Insulin  Pen Needle  (TECHLITE PEN NEEDLES) 31G X 5 MM MISC Use to inject Lantus  Solostar as directed. 06/14/22   Santana Cue, MD  irbesartan  (AVAPRO ) 150 MG tablet Take 1 tablet (150 mg total) by mouth at bedtime. 08/26/22   Edison Gore, MD  lidocaine  (LIDODERM ) 5 % Place 1 patch onto the skin daily. Remove & Discard patch within 12 hours or as directed by MD 06/24/23   Raspet, [place] Dales K, PA-C  montelukast  (SINGULAIR ) 10 MG tablet Take 1 tablet (10 mg total) by mouth at bedtime. 10/07/22   Clem Currier, DO  omeprazole  (PRILOSEC) 40 MG capsule Take 1 capsule (40 mg total) by mouth daily. 10/07/22   Clem Currier, DO  oxybutynin  (DITROPAN ) 5 MG tablet Take 1 tablet (5 mg total) by mouth 2 (two) times daily. 04/27/23   Edison Gore, MD  oxyCODONE  (OXY IR/ROXICODONE ) 5 MG immediate release tablet Take 1 tablet (5 mg total) by mouth every 6 (six) hours as needed for severe pain (pain score 7-10) or breakthrough pain. 06/21/23   Edison Gore, MD  pregabalin  (LYRICA ) 75 MG capsule Take 1 capsule (75 mg total) by mouth 2 (two) times daily. Back down to once daily if you become drowsy. 06/23/22   Santana Cue, MD  tirzepatide  (MOUNJARO ) 15 MG/0.5ML Pen Inject 15 mg into the skin once a week. 05/23/23   Edison Gore, MD      Allergies    Apple juice, Banana, Metformin  and related, and Shrimp [shellfish allergy]    Review of Systems   Review of Systems  Physical Exam Updated Vital Signs BP 131/72 (BP Location: Right Wrist)   Pulse 88   Temp 98.3 F (36.8 C)   Resp 13   Ht 5\' 3"  (1.6 m)   Wt (!) 148.1 kg   SpO2 98%   BMI 57.84 kg/m  Physical Exam Vitals reviewed.  Constitutional:      General: She is awake. She is not in acute distress.    Appearance: Normal appearance. She is well-developed. She is not ill-appearing.     Comments: Very pleasant female appears stated age in no acute distress sitting comfortably in exam room  HENT:     Head: Normocephalic and atraumatic.  Cardiovascular:     Rate and Rhythm:  Normal rate and regular rhythm.     Heart sounds: Normal heart sounds, S1 normal and S2 normal. No murmur heard. Pulmonary:     Effort: Pulmonary effort is normal.     Breath sounds: Normal breath sounds. No wheezing, rhonchi or rales.     Comments: Clear to auscultation bilaterally Musculoskeletal:     Cervical back: No tenderness or bony tenderness.     Thoracic back: No tenderness or bony tenderness.     Lumbar back: Tenderness and bony tenderness present.     Right hip: Tenderness and bony tenderness present. No deformity. Decreased range of motion.  Comments: Back: No pain with palpation of vertebrae.  No deformity or step-off noted.  Tender to palpation of right lumbar paraspinal muscles without spasm.  Strength 4/5 bilateral lower extremities.  Right hip: Tenderness palpation over inferior and lateral right hip.  No deformity noted.  Decreased range of motion secondary to pain.  Right leg: Subjective numbness of the right posterior thigh and right lateral lower leg when compared to contralateral side  Psychiatric:        Behavior: Behavior is cooperative.     ED Results / Procedures / Treatments   Labs (all labs ordered are listed, but only abnormal results are displayed) Labs Reviewed  COMPREHENSIVE METABOLIC PANEL WITH GFR - Abnormal; Notable for the following components:      Result Value   Glucose, Bld 135 (*)    Calcium  8.7 (*)    Albumin 3.1 (*)    All other components within normal limits  CBC - Abnormal; Notable for the following components:   RDW 15.7 (*)    All other components within normal limits  URINALYSIS, ROUTINE W REFLEX MICROSCOPIC - Abnormal; Notable for the following components:   Specific Gravity, Urine 1.031 (*)    Glucose, UA >=500 (*)    All other components within normal limits  LIPASE, BLOOD    EKG None  Radiology DG Hip Unilat With Pelvis 2-3 Views Right Result Date: 06/24/2023 CLINICAL DATA:  [id number] Hip pain [id number], right back and  hip pain EXAM: DG HIP (WITH OR WITHOUT PELVIS) 2-3V RIGHT COMPARISON:  February 20, 2015 FINDINGS: No acute fracture or dislocation. There is no evidence of arthropathy or other focal bone abnormality. Soft tissues are unremarkable. IMPRESSION: No acute fracture or dislocation. Electronically Signed   By: Rance Burrows M.D.   On: 06/24/2023 16:31    Procedures Procedures    Medications Ordered in ED Medications  LORazepam  (ATIVAN ) tablet 1 mg (has no administration in time range)  dexamethasone  (DECADRON ) injection 10 mg (10 mg Intramuscular Given 06/26/23 0500)  ketorolac  (TORADOL ) 15 MG/ML injection 15 mg (15 mg Intramuscular Given 06/26/23 0500)    ED Course/ Medical Decision Making/ A&P                                 Medical Decision Making Amount and/or Complexity of Data Reviewed Labs: ordered. Radiology: ordered.  Risk Prescription drug management.   This patient presents to the ED for concern of right leg pain and numbness, this involves an extensive number of treatment options, and is a complaint that carries with it a high risk of complications and morbidity.  The differential diagnosis includes radiculopathy, nerve impingement, paresthesias, others   Co morbidities / Chronic conditions that complicate the patient evaluation  Obesity, chronic back pain   Additional history obtained:  Additional history obtained from EMR External records from outside source obtained and reviewed including urgent care notes   Lab Tests:  I Ordered, and personally interpreted labs.  The pertinent results include: UA with greater than 500 glucose   Imaging Studies ordered:  I ordered imaging studies including MRI of the lumbar and thoracic spine   Problem List / ED Course / Critical interventions / Medication management   I ordered medication including Decadron  and Toradol , Ativan  Reevaluation of the patient after these medicines showed that the patient improved I have  reviewed the patients home medicines and have made adjustments as needed   Social Determinants  of Health:  Social Drivers of Health with Concerns   Financial Resource Strain: High Risk (11/22/2022)   Overall Financial Resource Strain (CARDIA)    Difficulty of Paying Living Expenses: Very hard  Food Insecurity: Food Insecurity Present (11/22/2022)   Hunger Vital Sign    Worried About Running Out of Food in the Last Year: Often true    Ran Out of Food in the Last Year: Often true  Physical Activity: Insufficiently Active (11/22/2022)   Exercise Vital Sign    Days of Exercise per Week: 2 days    Minutes of Exercise per Session: 40 min  Stress: Stress Concern Present (11/22/2022)   [employer] of Occupational Health - Occupational Stress Questionnaire    Feeling of Stress : Very much  Social Connections: Unknown (11/22/2022)   Social Connection and Isolation Panel [NHANES]    Frequency of Communication with Friends and Family: More than three times a week    Frequency of Social Gatherings with Friends and Family: More than three times a week    Attends Religious Services: Not on file    Active Member of Clubs or Organizations: Yes    Attends Banker Meetings: Not on file    Marital Status: Married  Depression (PHQ2-9): High Risk (06/21/2023)   Depression (PHQ2-9)    PHQ-2 Score: 18  Alcohol Screen: Not on file  Utilities: At Risk (11/22/2022)   Utilities    Threatened with loss of utilities: Yes      Test / Admission - Considered:  Patient care being transferred to Marcia Setters, PA-C at shift handoff.  Disposition to be determined after MRI results are available.         Final Clinical Impression(s) / ED Diagnoses Final diagnoses:  None    Rx / DC Orders ED Discharge Orders     None         Delories Fetter 06/26/23 0960    Eldon Greenland, MD 06/26/23 [phone]

## 2023-06-26 NOTE — ED Notes (Signed)
 Pt to MRI via stretcher.

## 2023-06-26 NOTE — ED Triage Notes (Signed)
 The pt is c/o rt back rt hip with pain radiating down her rt leg no know injury no previous history  she reports that the pain has increased as the hours have gone by

## 2023-06-26 NOTE — ED Provider Notes (Signed)
 Pt's care assumed at 6:30 am Pt complains of pain in her right leg and right low back.  Pt has numbness in her leg.  She reports a history of back problems.  Pt pending mri of T spine and LS spine.   Pt returned from [facility].  Pt can not tolerate.  Pt reports being tired.  Pt has been given ativan  po.  Pt offer IV ativan  to try to obtain MRI's.  Pt states she had rather follow up with her primary care MD at [facility] practice and schedule open MRi.   Pt reevaluated.  Pt has no loss of bowel or bladder control.  No saddle paresthesias.  Pt reports she uses a wheel chair.    Pt advised she can return if she changes her mind about mri.     Sandi Crosby, PA-C 06/26/23 0746    Carin Charleston, MD 06/26/23 [phone]

## 2023-06-27 ENCOUNTER — Other Ambulatory Visit: Payer: Self-pay

## 2023-06-27 ENCOUNTER — Encounter: Admitting: Family Medicine

## 2023-06-27 NOTE — Progress Notes (Deleted)
    SUBJECTIVE:   CHIEF COMPLAINT / HPI:   Due for pap ***  Also, seen in the ED 5/31 due to lumbar radiculopathy/leg numbness.  Patient was unable to tolerate MRI, was advised to follow-up outpatient to schedule an open MRI ***  PERTINENT  PMH / PSH: ***  OBJECTIVE:   There were no vitals taken for this visit.  ***  ASSESSMENT/PLAN:   Assessment & Plan    Edison Gore, MD [facility]

## 2023-06-28 ENCOUNTER — Other Ambulatory Visit (HOSPITAL_COMMUNITY): Payer: Self-pay

## 2023-06-28 ENCOUNTER — Other Ambulatory Visit: Payer: Self-pay | Admitting: Family Medicine

## 2023-06-28 ENCOUNTER — Other Ambulatory Visit: Payer: Self-pay

## 2023-06-28 DIAGNOSIS — F419 Anxiety disorder, unspecified: Secondary | ICD-10-CM

## 2023-06-28 DIAGNOSIS — I10 Essential (primary) hypertension: Secondary | ICD-10-CM

## 2023-06-28 DIAGNOSIS — E0810 Diabetes mellitus due to underlying condition with ketoacidosis without coma: Secondary | ICD-10-CM

## 2023-06-28 MED ORDER — FUROSEMIDE 20 MG PO TABS
20.0000 mg | ORAL_TABLET | Freq: Every day | ORAL | 2 refills | Status: AC | PRN
  Filled 2023-06-28: qty 30, 30d supply, fill #0
  Filled 2023-08-10: qty 30, 30d supply, fill #1
  Filled 2024-01-29: qty 30, 30d supply, fill #2

## 2023-06-28 MED ORDER — HYDROXYZINE HCL 50 MG PO TABS
50.0000 mg | ORAL_TABLET | Freq: Three times a day (TID) | ORAL | 2 refills | Status: AC | PRN
  Filled 2023-06-28: qty 30, 5d supply, fill #0
  Filled 2023-08-10: qty 30, 5d supply, fill #1
  Filled 2024-01-29: qty 30, 5d supply, fill #2

## 2023-06-29 ENCOUNTER — Other Ambulatory Visit: Payer: Self-pay

## 2023-07-05 ENCOUNTER — Encounter: Payer: Self-pay | Admitting: Family Medicine

## 2023-07-05 ENCOUNTER — Other Ambulatory Visit (HOSPITAL_COMMUNITY): Payer: Self-pay

## 2023-07-05 ENCOUNTER — Ambulatory Visit: Admitting: Family Medicine

## 2023-07-05 VITALS — BP 137/88 | HR 105 | Ht 63.0 in

## 2023-07-05 DIAGNOSIS — M5410 Radiculopathy, site unspecified: Secondary | ICD-10-CM | POA: Diagnosis not present

## 2023-07-05 MED ORDER — DIAZEPAM 2 MG PO TABS
2.0000 mg | ORAL_TABLET | Freq: Once | ORAL | 0 refills | Status: AC
  Filled 2023-07-05: qty 1, 1d supply, fill #0

## 2023-07-05 MED ORDER — PREDNISONE 20 MG PO TABS
40.0000 mg | ORAL_TABLET | Freq: Every day | ORAL | 0 refills | Status: AC
  Filled 2023-07-05: qty 6, 3d supply, fill #0

## 2023-07-05 NOTE — Progress Notes (Addendum)
    SUBJECTIVE:   CHIEF COMPLAINT / HPI:   Visited ED 5/30 due to new radiculopathy  - onset about 2 weeks ago No traumatic injury Numbness, tingling, and pain radiating down back of R leg into lateral foot Worse w movement  Has received decadron  and toradol  without much relief Attempted to get MRI but was unable to do this due to claustrophobia/anxiety; feels she did not get enough sedation Was advised to follow up outpatient to schedule open MRI with sedation/anxiolytic  Has received valium  2mg  for MRI in the past (2017), did well with this  Denies bowel/bladder incontinence, saddle paresthesias, weakness in extremities  Taking lyrica  for neuropathy  PERTINENT  PMH / PSH: HTN, asthma, OSA, DM, HLD, diabetic neuropathy  OBJECTIVE:   BP 137/88   Pulse (!) 105   Ht 5\' 3"  (1.6 m)   SpO2 100%   BMI 57.84 kg/m   General: NAD, pleasant, able to participate in exam Respiratory: No respiratory distress Skin: warm and dry, no rashes noted Psych: Normal affect and mood  MSK:  RLE exam limited due to pain with movement. Unable to lift leg off chair without significant pain. Pt reports some numbness/tingling with palpation along R posterolateral leg and lateral foot.   ASSESSMENT/PLAN:   Assessment & Plan Radiculopathy, unspecified spinal region ?S1-S2 given radiation down back of leg and lateral foot No red flags today concerning for acute cauda equina Visited ED, MRI was attempted but aborted due to anxiety/claustrophobia, it was recommended she be scheduled for Open MRI with anxiolytic  Spoke with radiology reading room who recommend [facility] [phone]) for Open MRI. Placed order for this Rx diazepam  2mg  for anxiety prior to procedure Discussed supportive care. Continue lyrica  Discussed return/ED precautions  Continue oxycodone , lyrica , voltaren  for pain Rx oral prednisone  x 3 days given some response to IM decadron  in ED   Edison Gore, MD [facility]

## 2023-07-05 NOTE — Addendum Note (Signed)
 Addended byBaby Bolt, Annabell Oconnor on: 07/05/2023 04:26 PM   Modules accepted: Orders

## 2023-07-05 NOTE — Patient Instructions (Addendum)
 I have placed an order for open MRI - you'll get a call soon to schedule this  I have also sent 1 tablet of diazepam  (Valium ) for you to take about 30 minutes prior to the MRI  Please make sure you have a ride to and from the MRI. Do not drive yourself.

## 2023-07-06 ENCOUNTER — Other Ambulatory Visit: Payer: Self-pay

## 2023-07-06 ENCOUNTER — Other Ambulatory Visit (HOSPITAL_COMMUNITY): Payer: Self-pay

## 2023-07-14 ENCOUNTER — Other Ambulatory Visit: Payer: Self-pay | Admitting: Family Medicine

## 2023-07-14 ENCOUNTER — Other Ambulatory Visit: Payer: Self-pay

## 2023-07-14 ENCOUNTER — Other Ambulatory Visit (HOSPITAL_COMMUNITY): Payer: Self-pay

## 2023-07-14 DIAGNOSIS — R52 Pain, unspecified: Secondary | ICD-10-CM

## 2023-07-14 MED ORDER — OXYCODONE HCL 5 MG PO TABS
5.0000 mg | ORAL_TABLET | Freq: Four times a day (QID) | ORAL | 0 refills | Status: DC | PRN
  Filled 2023-07-14 (×2): qty 60, 8d supply, fill #0

## 2023-07-14 NOTE — Telephone Encounter (Signed)
 Order needs to be put in before we can schedule the patient for her MRI.

## 2023-07-14 NOTE — Telephone Encounter (Signed)
 We Camilo Cella) contacted [facility] back informing the rep that the order is indeed in the system I believe she stated it will take a couple of days before they receive it but Camilo Cella kindly scheduled the appointment for the patient. The appt is scheduled for tomorrow at 915. I informed the patient about the appointment she seem hesitant about it, so I gave her the phone number to call them to reschedule if she have to. She also asked me if you filled her oxycodone  patient stated that she sent you a Mychart message about it.

## 2023-07-14 NOTE — Telephone Encounter (Signed)
 Informed the patient that her medication was sent to the pharmacy.

## 2023-07-15 ENCOUNTER — Telehealth: Payer: Self-pay | Admitting: Family Medicine

## 2023-07-15 ENCOUNTER — Other Ambulatory Visit: Payer: Self-pay | Admitting: Family Medicine

## 2023-07-15 DIAGNOSIS — M5416 Radiculopathy, lumbar region: Secondary | ICD-10-CM

## 2023-07-15 DIAGNOSIS — M5410 Radiculopathy, site unspecified: Secondary | ICD-10-CM

## 2023-07-15 NOTE — Telephone Encounter (Signed)
 Received a page this morning from [facility] in [place].  Called back and talked to Crystal at that facility.  Apparently patient was scheduled for an MRI appointment this morning at this facility and arrived for her appointment.  However, they were unable to see the order for this.  Additionally, they do not have an open MRI machine at their facility.  Based on previous discussion it was determined that [facility] [address] [facility] was the facility that had open MRI.  I called [facility] scheduling to coordinate this.  They reported that the earliest available appointment for the open MRI at the [facility] location would be on June 30 at 5:30 PM.  There is also a facility in [place] that has an open MRI but the earliest appointment is on July 1.  I went ahead and scheduled the patient for June 30 on [facility].  I called Crystal from the [place] facility back and she allowed me to speak with the patient.  We discussed these options and the patient preferred to try and get a sedated MRI scheduled instead.  I called the radiology department at [facility] who advised that the earliest available sedated MRI slot would likely not be until October due to lack of resources and that [facility] is the only hospital in the system that performs this.  Called the patient back and advised her of this.  Reluctantly she is okay with waiting until June 30.  I shared the appointment information with her and provided her with the facility's address and phone number.  She will need to arrive at 5 PM on June 30.  We discussed in detail strict ED/return precautions should her symptoms worsen or should her pain not be controlled by oral medications.  We discussed emergent symptoms with regards to her radicular pain including new numbness/weakness in extremities or groin, bowel/bladder incontinence, fevers.  In this situation she should present to the ED immediately.  For  now, advised patient to continue oxycodone  5-10 mg every 6 hours as needed.  I have previously sent in a refill for this.  Edison Gore, MD 11:41 AM 07/15/23

## 2023-07-20 ENCOUNTER — Other Ambulatory Visit: Payer: Self-pay

## 2023-07-20 ENCOUNTER — Other Ambulatory Visit (HOSPITAL_COMMUNITY): Payer: Self-pay

## 2023-07-20 ENCOUNTER — Other Ambulatory Visit (HOSPITAL_COMMUNITY)

## 2023-07-22 ENCOUNTER — Telehealth: Payer: Self-pay

## 2023-07-22 NOTE — Telephone Encounter (Signed)
 Received call from [facility]. I was informed that they never received imaging orders that were placed by Dr. Romelle for MRI.   Patient is scheduled for Monday, 07/25/23.  Requested that our office fax the orders to [phone] and [phone].  Faxed orders to numbers as requested.    Chiquita JAYSON English, RN

## 2023-07-25 DIAGNOSIS — M4726 Other spondylosis with radiculopathy, lumbar region: Secondary | ICD-10-CM | POA: Diagnosis not present

## 2023-07-25 DIAGNOSIS — M4807 Spinal stenosis, lumbosacral region: Secondary | ICD-10-CM | POA: Diagnosis not present

## 2023-07-25 DIAGNOSIS — M4727 Other spondylosis with radiculopathy, lumbosacral region: Secondary | ICD-10-CM | POA: Diagnosis not present

## 2023-07-25 DIAGNOSIS — M5117 Intervertebral disc disorders with radiculopathy, lumbosacral region: Secondary | ICD-10-CM | POA: Diagnosis not present

## 2023-07-25 DIAGNOSIS — M48061 Spinal stenosis, lumbar region without neurogenic claudication: Secondary | ICD-10-CM | POA: Diagnosis not present

## 2023-08-04 ENCOUNTER — Encounter: Payer: Self-pay | Admitting: Family Medicine

## 2023-08-04 ENCOUNTER — Other Ambulatory Visit (HOSPITAL_COMMUNITY): Payer: Self-pay

## 2023-08-04 ENCOUNTER — Other Ambulatory Visit: Payer: Self-pay | Admitting: Family Medicine

## 2023-08-04 ENCOUNTER — Ambulatory Visit: Payer: Self-pay | Admitting: Family Medicine

## 2023-08-04 DIAGNOSIS — R52 Pain, unspecified: Secondary | ICD-10-CM

## 2023-08-04 MED ORDER — OXYCODONE HCL 5 MG PO TABS
5.0000 mg | ORAL_TABLET | Freq: Four times a day (QID) | ORAL | 0 refills | Status: DC | PRN
  Filled 2023-08-04: qty 57, 7d supply, fill #0
  Filled 2023-08-11: qty 3, 1d supply, fill #1
  Filled 2023-09-23: qty 57, 7d supply, fill #1

## 2023-08-05 ENCOUNTER — Other Ambulatory Visit (HOSPITAL_COMMUNITY): Payer: Self-pay

## 2023-08-10 ENCOUNTER — Other Ambulatory Visit: Payer: Self-pay

## 2023-08-10 ENCOUNTER — Other Ambulatory Visit: Payer: Self-pay | Admitting: Family Medicine

## 2023-08-10 ENCOUNTER — Other Ambulatory Visit (HOSPITAL_COMMUNITY): Payer: Self-pay

## 2023-08-10 DIAGNOSIS — M174 Other bilateral secondary osteoarthritis of knee: Secondary | ICD-10-CM

## 2023-08-10 DIAGNOSIS — G8929 Other chronic pain: Secondary | ICD-10-CM

## 2023-08-10 DIAGNOSIS — J45909 Unspecified asthma, uncomplicated: Secondary | ICD-10-CM

## 2023-08-10 DIAGNOSIS — E0810 Diabetes mellitus due to underlying condition with ketoacidosis without coma: Secondary | ICD-10-CM

## 2023-08-10 DIAGNOSIS — I10 Essential (primary) hypertension: Secondary | ICD-10-CM

## 2023-08-10 MED ORDER — BUDESONIDE-FORMOTEROL FUMARATE 160-4.5 MCG/ACT IN AERO
2.0000 | INHALATION_SPRAY | Freq: Two times a day (BID) | RESPIRATORY_TRACT | 8 refills | Status: AC
  Filled 2023-08-10: qty 10.2, 30d supply, fill #0
  Filled 2023-09-23: qty 10.2, 30d supply, fill #1
  Filled 2023-11-13: qty 10.2, 30d supply, fill #2
  Filled 2023-12-15: qty 10.2, 30d supply, fill #3
  Filled 2024-01-29: qty 10.2, 30d supply, fill #4

## 2023-08-10 MED ORDER — DICLOFENAC SODIUM 50 MG PO TBEC
50.0000 mg | DELAYED_RELEASE_TABLET | Freq: Two times a day (BID) | ORAL | 0 refills | Status: DC | PRN
  Filled 2023-08-10: qty 60, 30d supply, fill #0

## 2023-08-10 MED ORDER — OXYBUTYNIN CHLORIDE 5 MG PO TABS
5.0000 mg | ORAL_TABLET | Freq: Two times a day (BID) | ORAL | 1 refills | Status: DC
  Filled 2023-08-10: qty 60, 30d supply, fill #0
  Filled 2023-09-23: qty 60, 30d supply, fill #1

## 2023-08-11 ENCOUNTER — Other Ambulatory Visit (HOSPITAL_COMMUNITY): Payer: Self-pay

## 2023-08-11 ENCOUNTER — Ambulatory Visit (INDEPENDENT_AMBULATORY_CARE_PROVIDER_SITE_OTHER): Admitting: Physical Medicine and Rehabilitation

## 2023-08-11 ENCOUNTER — Other Ambulatory Visit: Payer: Self-pay

## 2023-08-11 ENCOUNTER — Encounter: Payer: Self-pay | Admitting: Physical Medicine and Rehabilitation

## 2023-08-11 DIAGNOSIS — M5441 Lumbago with sciatica, right side: Secondary | ICD-10-CM | POA: Diagnosis not present

## 2023-08-11 DIAGNOSIS — G8929 Other chronic pain: Secondary | ICD-10-CM | POA: Diagnosis not present

## 2023-08-11 DIAGNOSIS — M5116 Intervertebral disc disorders with radiculopathy, lumbar region: Secondary | ICD-10-CM

## 2023-08-11 DIAGNOSIS — M5416 Radiculopathy, lumbar region: Secondary | ICD-10-CM

## 2023-08-11 MED ORDER — DIAZEPAM 5 MG PO TABS
ORAL_TABLET | ORAL | 0 refills | Status: DC
  Filled 2023-08-11 (×2): qty 1, 1d supply, fill #0

## 2023-08-11 MED ORDER — INSUPEN PEN NEEDLES 31G X 5 MM MISC
11 refills | Status: AC
  Filled 2023-08-11: qty 100, 100d supply, fill #0
  Filled 2023-12-15: qty 100, 100d supply, fill #1

## 2023-08-11 MED ORDER — PREGABALIN 75 MG PO CAPS
75.0000 mg | ORAL_CAPSULE | Freq: Two times a day (BID) | ORAL | 3 refills | Status: AC
  Filled 2023-08-11: qty 180, 90d supply, fill #0
  Filled 2023-11-13: qty 180, 90d supply, fill #1
  Filled 2024-01-29: qty 180, 90d supply, fill #2

## 2023-08-11 NOTE — Progress Notes (Signed)
 [name] - 58 y.o. female MRN [id number]  Date of birth: 11/15/1965  Office Visit Note: Visit Date: 08/11/2023 PCP: Romelle Booty, MD Referred by: Madelon Donald HERO, DO  Subjective: Chief Complaint  Patient presents with   Lower Back - Pain   HPI: [name] is a 58 y.o. female who comes in today per the request of Dr. Donald Madelon for evaluation of chronic, worsening and severe right sided deck lower back pain radiating down posterior leg to foot. Also reports numbness/tingling to entire right lower extremity. Pain ongoing for several months. No specific aggravating factors. She describes her pain as sharp sensation, currently rates as 8 out of 10. Some relief of pain with home exercise regimen, rest and use of medications. She is prescribed Oxycodone  by her primary care provider Dr. Booty Romelle. No history of dedicated physical therapy for her back . Recent lumbar MRI imaging shows multi level facet arthropathy, there is right paracentral disc extrusion causing mild central canal severe right lateral recess stenosis with compression upon the transversing S1 nerve root. No high grade spinal canal stenosis noted. No history of lumbar surgery/injections. Patient is using wheelchair to assist with ambulation. Denies recent trauma or falls.      Review of Systems  Musculoskeletal:  Positive for back pain.  Neurological:  Positive for tingling. Negative for focal weakness and weakness.  All other systems reviewed and are negative.  Otherwise per HPI.  Assessment & Plan: Visit Diagnoses:    ICD-10-CM   1. Chronic right-sided low back pain with right-sided sciatica  M54.41 Ambulatory referral to Orthopedic Surgery   G89.29 Ambulatory referral to Physical Medicine Rehab    2. Lumbar radiculopathy  M54.16 Ambulatory referral to Orthopedic Surgery    Ambulatory referral to Physical Medicine Rehab    3. Intervertebral disc disorders with radiculopathy, lumbar region  M51.16  Ambulatory referral to Orthopedic Surgery    Ambulatory referral to Physical Medicine Rehab    4. Morbid (severe) obesity due to excess calories (HCC)  E66.01 Ambulatory referral to Orthopedic Surgery    Ambulatory referral to Physical Medicine Rehab       Plan: Findings:  Chronic, worsening and severe right sided deck lower back pain radiating down posterior leg to foot. Patient continues to have severe pain despite good conservative therapies such as home exercise regimen, rest and use of medications.  I discussed recent lumbar MRI today using report and spine model.  She did bring in CD of lumbar MRI imaging that I reviewed independently.  Imaging does show a right paracentral disc herniation at the level of L5-S1 compressing the S1 nerve root.  Patient's clinical presentation and exam are consistent with lumbar radiculopathy, more of an S1 nerve pattern.  We discussed treatment plan in detail today.  Next step is to perform diagnostic and hopefully therapeutic right L5-S1 interlaminar epidural steroid injection under fluoroscopic guidance.  She is not currently taking anticoagulant medication.  I discussed injection procedure with her in detail today.  She did voice anxiety related to procedures, I prescribed pre-procedure Valium  for her to take on day of injection.  Dr. Eldonna also at bedside to discuss injection and answer any further questions.  Patient did voice that she would like to speak with our spine surgeon to discuss surgical options.  I did go ahead and place referral to Dr. Ozell Ada.  We will see her back for injection pending insurance approval.  No red flag symptoms noted upon exam today.  Meds & Orders:  Meds ordered this encounter  Medications   diazepam  (VALIUM ) 5 MG tablet    Sig: Take one tablet by mouth with light food one hour prior to procedure.    Dispense:  1 tablet    Refill:  0    Orders Placed This Encounter  Procedures   Ambulatory referral to Orthopedic  Surgery   Ambulatory referral to Physical Medicine Rehab    Follow-up: Return for Right L5-S1 interlaminar epidural steroid injection.   Procedures: No procedures performed      Clinical History: MRI lumbar spine:   TECHNIQUE: Sagittal and axial T1 and T2-weighted sequences were performed. Additional sagittal STIR images were performed.   INDICATION: Radiculopathy   COMPARISON: None   FINDINGS:  # Lumbar alignment is preserved.  #  Vertebral body heights are well maintained.  #  The marrow signal intensity is normal.  #  Conus terminates at L1 without evidence of tethering.  #  Nerve roots appear normal.  #  Incidental findings: None.    #  L1-2: Normal.  #   #  L2-3: Facet arthropathy. No significant central canal stenosis or neuroforaminal narrowing  #   #  L3-4: Marked bilateral facet arthropathy with mild central canal stenosis and mild bilateral neuroforaminal narrowing.  #   #  L4-5: Facet arthropathy and disc bulge with mild central canal stenosis. Neuroforamina patent.  #   #  L5-S1: Facet arthropathy and disc bulge with right paracentral disc extrusion causes mild central canal and severe right lateral recess stenosis with impingement upon the transversing S1 nerve root.    IMPRESSION:   1. Lumbar spondylosis most significant L5-S1 with right paracentral disc extrusion causing mild central canal severe right lateral recess stenosis with compression upon the transversing S1 nerve root.   2.  Mild central canal stenosis is present at L3-4 and L4-5.   Electronically Signed by: Dallas Jubilee, MD on 08/04/2023 1:38 PM   She reports that she has never smoked. She has never used smokeless tobacco.  Recent Labs    08/24/22 1559 11/05/22 1550 06/21/23 1135  HGBA1C 7.2* 7.1* 6.8    Objective:  VS:  HT:    WT:   BMI:     BP:   HR: bpm  TEMP: ( )  RESP:  Physical Exam Vitals and nursing note reviewed.  HENT:     Head: Normocephalic and atraumatic.      Right Ear: External ear normal.     Left Ear: External ear normal.     Nose: Nose normal.     Mouth/Throat:     Mouth: Mucous membranes are moist.  Eyes:     Extraocular Movements: Extraocular movements intact.  Cardiovascular:     Rate and Rhythm: Normal rate.     Pulses: Normal pulses.  Pulmonary:     Effort: Pulmonary effort is normal.  Abdominal:     General: Abdomen is flat. There is no distension.  Musculoskeletal:        General: Tenderness present.     Cervical back: Normal range of motion.     Comments: Patient is slow to rise from seated position to standing. Good lumbar range of motion. No pain noted with facet loading. 5/5 strength noted with bilateral hip flexion, knee flexion/extension, ankle dorsiflexion/plantarflexion and EHL. No clonus noted bilaterally. No pain upon palpation of greater trochanters. No pain with internal/external rotation of bilateral hips. Sensation intact bilaterally. Dysesthesias noted to right S1 dermatome. Negative  slump test bilaterally. Wheelchair to assist with ambulation, gait slow and unsteady.     Skin:    General: Skin is warm and dry.     Capillary Refill: Capillary refill takes less than 2 seconds.  Neurological:     Mental Status: She is alert and oriented to person, place, and time.     Gait: Gait abnormal.  Psychiatric:        Mood and Affect: Mood normal.        Behavior: Behavior normal.     Ortho Exam  Imaging: No results found.  Past Medical/Family/Surgical/Social History: Medications & Allergies reviewed per EMR, new medications updated. Patient Active Problem List   Diagnosis Date Noted   Weight loss advised 03/07/2022   Drug-induced constipation 12/09/2021   Anxiety 09/23/2021   Chronic pain of both shoulders 03/24/2021   Left wrist pain 03/24/2021   Diabetic neuropathy (HCC) 02/25/2021   Healthcare maintenance 10/21/2020   Hyperlipidemia (primary prevention, LDL goal <70) 09/04/2020   Wheelchair dependence  05/05/2020   Carpal tunnel syndrome of right wrist, RECURRENT 10/15/2019   Insomnia, idiopathic 09/30/2018   Grade I diastolic dysfunction 09/30/2018   Encounter for chronic pain management 02/11/2014   Diabetes (HCC) 02/11/2014   Type 2 diabetes mellitus without complications (HCC) 02/11/2014   Cystocele 09/14/2012   Sarcoidosis 12/23/2010   Chronic pain of both knees 05/28/2010   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   Allergic rhinitis 05/30/2008   Carpal tunnel syndrome of left wrist 01/17/2008   OBESITY HYPOVENTILATION SYNDROME 12/27/2007   Uncomplicated asthma 12/01/2007   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   Obstructive sleep apnea 10/30/2007   BMI 50.0-59.9, adult (HCC) 04/10/2007   MYASTHENIA 03/07/2007   Depression, recurrent (HCC) 03/24/2006   Essential (primary) hypertension 03/24/2006   Gastro-esophageal reflux disease without esophagitis 03/24/2006   Major depressive disorder, single episode 03/24/2006   Past Medical History:  Diagnosis Date   ALLERGIC RHINITIS    takes Zyrtec  daily   Anxiety    Arthritis    Asthma    Albuterol  prn;Symbicort  daily and Singulair  daily   Bronchitis    hx of   CAP (community acquired pneumonia) 02/20/2015   Carpal tunnel syndrome    right   Chronic back pain    Constipation    Miralax  prn   Depression    takes Cymbalta  daily   Diabetes (HCC)    type 2    Eczema    Gallstones 2010   GERD (gastroesophageal reflux disease)    takes Omeprazole  daily   Hemorrhoids    Hypertension    takes Prinizide daily and Clonidine  on Mondays   Hypoventilation associated with obesity syndrome (HCC)    Insomnia    Irritable bladder    Joint pain    Morbid (severe) obesity due to excess calories (HCC) 04/10/2007   Restrictive changes on pfts 11/30/2007     Myasthenia gravis 1994   Positive Acetylcholine receptor Ab and single fiber EMG Dr. Lynwood Barrio [facility]- Dr. 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 CXR improved/stable. PFT wnl. I am happy patient had these tests and we have a baseline for her. At this time, I do not see an indication for starting chronic steroids daily. Patient agrees. Her skin lesions are bothering her the most and syst   Shortness of breath    can be sitting as well as exertion   Skin irritation    skin itchy   Trigger finger    Urinary frequency    takes Ditropan  daily   Viral meningitis    history of viral meningitis   Family History  Problem Relation Age of Onset   Hypertension Father    Sickle cell trait Father    Diabetes Father        Borderline   Diabetes Maternal Grandmother    Stroke Paternal Grandmother    Sickle cell trait Paternal Grandfather    Diabetes Paternal Grandfather    Crohn's disease Paternal Aunt    Lupus Other        Mother's first cousin   Anesthesia problems Neg Hx    Hypotension Neg Hx    Malignant hyperthermia Neg Hx    Pseudochol deficiency Neg Hx    Colon cancer Neg Hx    Colon polyps Neg Hx    Heart disease Neg Hx    Kidney disease Neg Hx    Esophageal cancer Neg Hx    Gallbladder disease Neg Hx    Past Surgical History:  Procedure Laterality Date   BRONCHOSCOPY  08/2007   CARPAL TUNNEL RELEASE  05/20/2011   Procedure: CARPAL TUNNEL RELEASE;  Surgeon:  Eva Elsie Herring, MD;  Location: [facility] OR;  Service: Orthopedics;  Laterality: Right;   CARPAL TUNNEL RELEASE Right 11/22/2019   Procedure: RIGHT HAND CARPAL TUNNEL RELEASE;  Surgeon: Herring Eva, MD;  Location: [facility]S;  Service: Orthopedics;  Laterality: Right;   CARPAL TUNNEL RELEASE Left 02/25/2022   Procedure: CARPAL TUNNEL RELEASE;  Surgeon: Herring Eva, MD;  Location: [facility];  Service: Orthopedics;  Laterality: Left;   CESAREAN SECTION  09/15/2004   COLONOSCOPY N/A 01/21/2014   Procedure: COLONOSCOPY;  Surgeon: Lupita FORBES Commander, MD;  Location: [facility];  Service: Endoscopy;  Laterality: N/A;   cortisone injection     receives an injection every 3months   ESOPHAGOGASTRODUODENOSCOPY     ESOPHAGOGASTRODUODENOSCOPY N/A 01/21/2014   Procedure: ESOPHAGOGASTRODUODENOSCOPY (EGD);  Surgeon: Lupita FORBES Commander, MD;  Location: [facility];  Service: Endoscopy;  Laterality: N/A;   LAPAROSCOPIC CHOLECYSTECTOMY  07/2008   by Dr. Sherlean Laughter   Thymus resection  08/25/1993   TRIGGER FINGER RELEASE  05/20/2011   Procedure: RELEASE TRIGGER FINGER/A-1 PULLEY;  Surgeon: Eva Elsie Herring, MD;  Location: [facility] OR;  Service: Orthopedics;  Laterality: Right;  RIGHT TRIGGER THUMB RELEASE AND RIGHT CARPAL TUNNEL RELEASE   Social History   Occupational History   Occupation: Disabled  Tobacco Use   Smoking status: Never   Smokeless tobacco: Never  Vaping Use   Vaping status: Never Used  Substance and Sexual Activity   Alcohol use: No   Drug use: No   Sexual activity: Yes    Birth control/protection: Injection

## 2023-08-11 NOTE — Progress Notes (Signed)
 Pain Scale   Average Pain 8 Patient advised she has lower back pain radiating to right leg. Patient advised sitting and walking increases her pain. Patient advised when she takes medication to relieve her pain.        +Driver, -BT, -Dye Allergies.

## 2023-08-12 ENCOUNTER — Other Ambulatory Visit (HOSPITAL_COMMUNITY): Payer: Self-pay

## 2023-08-12 ENCOUNTER — Other Ambulatory Visit: Payer: Self-pay

## 2023-09-04 ENCOUNTER — Encounter (HOSPITAL_BASED_OUTPATIENT_CLINIC_OR_DEPARTMENT_OTHER): Admitting: Internal Medicine

## 2023-09-05 ENCOUNTER — Other Ambulatory Visit: Payer: Self-pay

## 2023-09-05 ENCOUNTER — Ambulatory Visit: Admitting: Physical Medicine and Rehabilitation

## 2023-09-05 VITALS — BP 126/84 | HR 94

## 2023-09-05 DIAGNOSIS — M5416 Radiculopathy, lumbar region: Secondary | ICD-10-CM

## 2023-09-05 MED ORDER — METHYLPREDNISOLONE ACETATE 40 MG/ML IJ SUSP
40.0000 mg | Freq: Once | INTRAMUSCULAR | Status: AC
  Administered 2023-09-05 (×2): 40 mg

## 2023-09-05 NOTE — Progress Notes (Signed)
 Pain Scale   Average Pain 8 Patient advising she has chronic lower back pain radiating to right leg. Patient advises her pain is constant.        +Driver, -BT, -Dye Allergies.

## 2023-09-11 NOTE — Progress Notes (Signed)
 [name] - 58 y.o. female MRN [id number]  Date of birth: [birth year]  Office Visit Note: Visit Date: 09/05/2023 PCP: Romelle Booty, MD Referred by: Romelle Booty, MD  Subjective: Chief Complaint  Patient presents with   Lower Back - Pain   HPI:  [name] is a 58 y.o. female who comes in today at the request of Duwaine Pouch, FNP for planned Right L5-S1 Lumbar Interlaminar epidural steroid injection with fluoroscopic guidance.  The patient has failed conservative care including home exercise, medications, time and activity modification.  This injection will be diagnostic and hopefully therapeutic.  Please see requesting physician notes for further details and justification.   ROS Otherwise per HPI.  Assessment & Plan: Visit Diagnoses:    ICD-10-CM   1. Lumbar radiculopathy  M54.16 XR C-ARM NO REPORT    Epidural Steroid injection    methylPREDNISolone  acetate (DEPO-MEDROL ) injection 40 mg      Plan: No additional findings.   Meds & Orders:  Meds ordered this encounter  Medications   methylPREDNISolone  acetate (DEPO-MEDROL ) injection 40 mg    Orders Placed This Encounter  Procedures   XR C-ARM NO REPORT   Epidural Steroid injection    Follow-up: Return for visit to requesting provider as needed.   Procedures: No procedures performed  Lumbar Epidural Steroid Injection - Interlaminar Approach with Fluoroscopic Guidance  Patient: [name]      Date of Birth: 04-Aug-1965 MRN: [id number] PCP: Romelle Booty, MD      Visit Date: 09/05/2023   Universal Protocol:     Consent Given By: the patient  Position: PRONE  Additional Comments: Vital signs were monitored before and after the procedure. Patient was prepped and draped in the usual sterile fashion. The correct patient, procedure, and site was verified.   Injection Procedure Details:   Procedure diagnoses: Lumbar radiculopathy [M54.16]   Meds Administered:  Meds ordered this encounter   Medications   methylPREDNISolone  acetate (DEPO-MEDROL ) injection 40 mg     Laterality: Right  Location/Site:  L5-S1  Needle: 6.0 in., 20 ga. Tuohy  Needle Placement: Paramedian epidural  Findings:   -Comments: Excellent flow of contrast into the epidural space.  Procedure Details: Using a paramedian approach from the side mentioned above, the region overlying the inferior lamina was localized under fluoroscopic visualization and the soft tissues overlying this structure were infiltrated with 4 ml. of 1% Lidocaine  without Epinephrine . The Tuohy needle was inserted into the epidural space using a paramedian approach.   The epidural space was localized using loss of resistance along with counter oblique bi-planar fluoroscopic views.  After negative aspirate for air, blood, and CSF, a 2 ml. volume of Isovue-250 was injected into the epidural space and the flow of contrast was observed. Radiographs were obtained for documentation purposes.    The injectate was administered into the level noted above.   Additional Comments:  The patient tolerated the procedure well Dressing: 2 x 2 sterile gauze and Band-Aid    Post-procedure details: Patient was observed during the procedure. Post-procedure instructions were reviewed.  Patient left the clinic in stable condition.   Clinical History: MRI lumbar spine:   TECHNIQUE: Sagittal and axial T1 and T2-weighted sequences were performed. Additional sagittal STIR images were performed.   INDICATION: Radiculopathy   COMPARISON: None   FINDINGS:  # Lumbar alignment is preserved.  #  Vertebral body heights are well maintained.  #  The marrow signal intensity is normal.  #  Conus terminates  at L1 without evidence of tethering.  #  Nerve roots appear normal.  #  Incidental findings: None.    #  L1-2: Normal.  #   #  L2-3: Facet arthropathy. No significant central canal stenosis or neuroforaminal narrowing  #   #  L3-4: Marked  bilateral facet arthropathy with mild central canal stenosis and mild bilateral neuroforaminal narrowing.  #   #  L4-5: Facet arthropathy and disc bulge with mild central canal stenosis. Neuroforamina patent.  #   #  L5-S1: Facet arthropathy and disc bulge with right paracentral disc extrusion causes mild central canal and severe right lateral recess stenosis with impingement upon the transversing S1 nerve root.    IMPRESSION:   1. Lumbar spondylosis most significant L5-S1 with right paracentral disc extrusion causing mild central canal severe right lateral recess stenosis with compression upon the transversing S1 nerve root.   2.  Mild central canal stenosis is present at L3-4 and L4-5.   Electronically Signed by: Dallas Jubilee, MD on 08/04/2023 1:38 PM     Objective:  VS:  HT:    WT:   BMI:     BP:126/84  HR:94bpm  TEMP: ( )  RESP:  Physical Exam Vitals and nursing note reviewed.  Constitutional:      General: She is not in acute distress.    Appearance: Normal appearance. She is obese. She is not ill-appearing.  HENT:     Head: Normocephalic and atraumatic.     Right Ear: External ear normal.     Left Ear: External ear normal.  Eyes:     Extraocular Movements: Extraocular movements intact.  Cardiovascular:     Rate and Rhythm: Normal rate.     Pulses: Normal pulses.  Pulmonary:     Effort: Pulmonary effort is normal. No respiratory distress.  Abdominal:     General: There is no distension.     Palpations: Abdomen is soft.  Musculoskeletal:        General: Tenderness present.     Cervical back: Neck supple.     Right lower leg: No edema.     Left lower leg: No edema.     Comments: Patient has good distal strength with no pain over the greater trochanters.  No clonus or focal weakness.  Skin:    Findings: No erythema, lesion or rash.  Neurological:     General: No focal deficit present.     Mental Status: She is alert and oriented to person, place, and time.      Sensory: No sensory deficit.     Motor: No weakness or abnormal muscle tone.     Coordination: Coordination normal.  Psychiatric:        Mood and Affect: Mood normal.        Behavior: Behavior normal.      Imaging: No results found.

## 2023-09-11 NOTE — Procedures (Signed)
 Lumbar Epidural Steroid Injection - Interlaminar Approach with Fluoroscopic Guidance  Patient: [name]      Date of Birth: 27-Apr-1965 MRN: [id number] PCP: Romelle Booty, MD      Visit Date: 09/05/2023   Universal Protocol:     Consent Given By: the patient  Position: PRONE  Additional Comments: Vital signs were monitored before and after the procedure. Patient was prepped and draped in the usual sterile fashion. The correct patient, procedure, and site was verified.   Injection Procedure Details:   Procedure diagnoses: Lumbar radiculopathy [M54.16]   Meds Administered:  Meds ordered this encounter  Medications   methylPREDNISolone  acetate (DEPO-MEDROL ) injection 40 mg     Laterality: Right  Location/Site:  L5-S1  Needle: 6.0 in., 20 ga. Tuohy  Needle Placement: Paramedian epidural  Findings:   -Comments: Excellent flow of contrast into the epidural space.  Procedure Details: Using a paramedian approach from the side mentioned above, the region overlying the inferior lamina was localized under fluoroscopic visualization and the soft tissues overlying this structure were infiltrated with 4 ml. of 1% Lidocaine  without Epinephrine . The Tuohy needle was inserted into the epidural space using a paramedian approach.   The epidural space was localized using loss of resistance along with counter oblique bi-planar fluoroscopic views.  After negative aspirate for air, blood, and CSF, a 2 ml. volume of Isovue-250 was injected into the epidural space and the flow of contrast was observed. Radiographs were obtained for documentation purposes.    The injectate was administered into the level noted above.   Additional Comments:  The patient tolerated the procedure well Dressing: 2 x 2 sterile gauze and Band-Aid    Post-procedure details: Patient was observed during the procedure. Post-procedure instructions were reviewed.  Patient left the clinic in stable  condition.

## 2023-09-13 ENCOUNTER — Other Ambulatory Visit: Payer: Self-pay

## 2023-09-13 ENCOUNTER — Inpatient Hospital Stay
Admission: RE | Admit: 2023-09-13 | Discharge: 2023-09-13 | Discharge status: Home / Self Care | Source: Ambulatory Visit | Attending: Family Medicine | Admitting: Family Medicine

## 2023-09-13 DIAGNOSIS — Z1231 Encounter for screening mammogram for malignant neoplasm of breast: Secondary | ICD-10-CM | POA: Diagnosis not present

## 2023-09-14 ENCOUNTER — Other Ambulatory Visit: Payer: Self-pay

## 2023-09-15 ENCOUNTER — Other Ambulatory Visit (HOSPITAL_COMMUNITY): Payer: Self-pay

## 2023-09-15 ENCOUNTER — Encounter: Admitting: Orthopedic Surgery

## 2023-09-15 ENCOUNTER — Other Ambulatory Visit: Payer: Self-pay

## 2023-09-23 ENCOUNTER — Other Ambulatory Visit: Payer: Self-pay | Admitting: Family Medicine

## 2023-09-23 ENCOUNTER — Encounter: Payer: Self-pay | Admitting: Family Medicine

## 2023-09-23 ENCOUNTER — Other Ambulatory Visit (HOSPITAL_COMMUNITY): Payer: Self-pay

## 2023-09-23 ENCOUNTER — Other Ambulatory Visit: Payer: Self-pay | Admitting: Student

## 2023-09-23 ENCOUNTER — Other Ambulatory Visit: Payer: Self-pay

## 2023-09-23 DIAGNOSIS — M174 Other bilateral secondary osteoarthritis of knee: Secondary | ICD-10-CM

## 2023-09-23 DIAGNOSIS — D869 Sarcoidosis, unspecified: Secondary | ICD-10-CM

## 2023-09-23 DIAGNOSIS — G8929 Other chronic pain: Secondary | ICD-10-CM

## 2023-09-23 DIAGNOSIS — R52 Pain, unspecified: Secondary | ICD-10-CM

## 2023-09-23 DIAGNOSIS — E1165 Type 2 diabetes mellitus with hyperglycemia: Secondary | ICD-10-CM

## 2023-09-23 DIAGNOSIS — I10 Essential (primary) hypertension: Secondary | ICD-10-CM

## 2023-09-23 DIAGNOSIS — E0810 Diabetes mellitus due to underlying condition with ketoacidosis without coma: Secondary | ICD-10-CM

## 2023-09-23 MED ORDER — IRBESARTAN 150 MG PO TABS
150.0000 mg | ORAL_TABLET | Freq: Every day | ORAL | 3 refills | Status: AC
  Filled 2023-09-23 – 2023-11-13 (×2): qty 90, 90d supply, fill #0
  Filled 2024-01-29: qty 90, 90d supply, fill #1

## 2023-09-23 MED ORDER — OXYCODONE HCL 5 MG PO TABS
5.0000 mg | ORAL_TABLET | Freq: Four times a day (QID) | ORAL | 0 refills | Status: DC | PRN
  Filled 2023-09-23 – 2023-09-24 (×2): qty 40, 5d supply, fill #0

## 2023-09-23 MED ORDER — CETIRIZINE HCL 10 MG PO TABS
10.0000 mg | ORAL_TABLET | Freq: Every day | ORAL | 3 refills | Status: AC
  Filled 2023-09-23 – 2023-11-13 (×2): qty 90, 90d supply, fill #0
  Filled 2024-01-29: qty 90, 90d supply, fill #1

## 2023-09-23 MED ORDER — BUPROPION HCL ER (XL) 300 MG PO TB24
300.0000 mg | ORAL_TABLET | Freq: Every day | ORAL | 3 refills | Status: AC
  Filled 2023-09-23 – 2023-11-13 (×2): qty 90, 90d supply, fill #0
  Filled 2024-01-29: qty 90, 90d supply, fill #1

## 2023-09-23 MED ORDER — MONTELUKAST SODIUM 10 MG PO TABS
10.0000 mg | ORAL_TABLET | Freq: Every day | ORAL | 3 refills | Status: AC
  Filled 2023-09-23 – 2023-11-13 (×2): qty 90, 90d supply, fill #0
  Filled 2024-01-29: qty 90, 90d supply, fill #1

## 2023-09-23 MED ORDER — HYDROCHLOROTHIAZIDE 25 MG PO TABS
25.0000 mg | ORAL_TABLET | Freq: Every day | ORAL | 3 refills | Status: AC
  Filled 2023-09-23 – 2023-11-13 (×2): qty 90, 90d supply, fill #0
  Filled 2024-01-29: qty 90, 90d supply, fill #1

## 2023-09-23 MED ORDER — DICLOFENAC SODIUM 50 MG PO TBEC
50.0000 mg | DELAYED_RELEASE_TABLET | Freq: Two times a day (BID) | ORAL | 0 refills | Status: DC | PRN
  Filled 2023-09-23: qty 60, 30d supply, fill #0

## 2023-09-23 MED ORDER — OMEPRAZOLE 40 MG PO CPDR
40.0000 mg | DELAYED_RELEASE_CAPSULE | Freq: Every day | ORAL | 3 refills | Status: AC
  Filled 2023-09-23 – 2023-11-13 (×2): qty 90, 90d supply, fill #0
  Filled 2024-01-29: qty 90, 90d supply, fill #1

## 2023-09-23 MED ORDER — MOUNJARO 15 MG/0.5ML ~~LOC~~ SOAJ
15.0000 mg | SUBCUTANEOUS | 0 refills | Status: DC
  Filled 2023-09-23: qty 6, 84d supply, fill #0

## 2023-09-23 MED ORDER — EMPAGLIFLOZIN 10 MG PO TABS
10.0000 mg | ORAL_TABLET | Freq: Every day | ORAL | 3 refills | Status: DC
  Filled 2023-09-23: qty 90, 90d supply, fill #0

## 2023-09-23 MED ORDER — DEXCOM G7 SENSOR MISC
11 refills | Status: AC
  Filled 2023-09-23: qty 3, 30d supply, fill #0
  Filled 2023-11-13: qty 3, 30d supply, fill #1
  Filled 2023-12-15: qty 3, 30d supply, fill #2
  Filled 2024-01-29: qty 3, 30d supply, fill #3

## 2023-09-23 NOTE — Telephone Encounter (Signed)
 Patient calls nurse line requesting a refill on Oxycodone .   She reports she will be out tomorrow and does not want to go the long weekend without any pain medicine.  Advised will forward to PCP.

## 2023-09-24 ENCOUNTER — Other Ambulatory Visit (HOSPITAL_COMMUNITY): Payer: Self-pay

## 2023-10-03 NOTE — Progress Notes (Unsigned)
    SUBJECTIVE:   CHIEF COMPLAINT / HPI:   Left-sided earache and jaw pain ongoing for about 1 month No fevers, drainage, injury Pain is intermittent and usually associated with air flow (via the ceiling fan) or water getting in the ear in the shower No rash or lesions Right ear is normal Left jaw pain usually with eating or opening the mouth widely.  Occasionally hears pops or clicks.  Unsure if she grinds her teeth at night   PERTINENT  PMH / PSH: HTN, asthma, OSA, DM, HLD, diabetic neuropathy    OBJECTIVE:   BP 137/85   Pulse (!) 105   Ht 5' 3 (1.6 m)   Wt (!) 325 lb 3.2 oz (147.5 kg)   SpO2 98%   BMI 57.61 kg/m   General: NAD, pleasant, able to participate in exam HEENT: Tender to palpation over TMJ.  Pain in TMJ with wide opening of the mouth.  Sensation intact throughout face.  No facial droop.  No significant dental abscess or lesions noted inside mouth.  Bilateral TMs visualized without significant bulging or erythema, bilateral ear canals and external ears normal.  External ear nontender to palpation. Respiratory: No respiratory distress Skin: warm and dry, no rashes noted Psych: Normal affect and mood  ASSESSMENT/PLAN:   Assessment & Plan Dysfunction of left eustachian tube Earache on left TMJ dysfunction Suspect eustachian tube dysfunction given lack of infection on exam And possibly also complicated by TMJ dysfunction Already on several pain medications including oxycodone , diclofenac , and Lyrica  so I hesitate to add something like a muscle relaxer or TCA Provided information and precautions regarding TMJ dysfunction Sent Rx for Flonase  to try for eustachian tube dysfunction Advised follow-up with dentist to consider mouthguard If symptoms persist or worsen could consider ENT referral Screening for colon cancer Due for colonoscopy later this year, sent referral to GI Encounter for immunization Flu shot today   Payton Coward, MD [facility]

## 2023-10-04 ENCOUNTER — Encounter: Payer: Self-pay | Admitting: Family Medicine

## 2023-10-04 ENCOUNTER — Ambulatory Visit: Payer: Self-pay | Admitting: Family Medicine

## 2023-10-04 ENCOUNTER — Other Ambulatory Visit (HOSPITAL_COMMUNITY): Payer: Self-pay

## 2023-10-04 ENCOUNTER — Other Ambulatory Visit: Payer: Self-pay

## 2023-10-04 VITALS — BP 137/85 | HR 105 | Ht 63.0 in | Wt 325.2 lb

## 2023-10-04 DIAGNOSIS — M26609 Unspecified temporomandibular joint disorder, unspecified side: Secondary | ICD-10-CM

## 2023-10-04 DIAGNOSIS — H6992 Unspecified Eustachian tube disorder, left ear: Secondary | ICD-10-CM | POA: Diagnosis not present

## 2023-10-04 DIAGNOSIS — Z1211 Encounter for screening for malignant neoplasm of colon: Secondary | ICD-10-CM

## 2023-10-04 DIAGNOSIS — Z124 Encounter for screening for malignant neoplasm of cervix: Secondary | ICD-10-CM

## 2023-10-04 DIAGNOSIS — Z23 Encounter for immunization: Secondary | ICD-10-CM | POA: Diagnosis not present

## 2023-10-04 DIAGNOSIS — H9202 Otalgia, left ear: Secondary | ICD-10-CM

## 2023-10-04 MED ORDER — FLUTICASONE PROPIONATE 50 MCG/ACT NA SUSP
2.0000 | Freq: Every day | NASAL | 6 refills | Status: AC
  Filled 2023-10-04: qty 16, 30d supply, fill #0
  Filled 2023-11-13: qty 16, 30d supply, fill #1
  Filled 2023-12-15: qty 16, 30d supply, fill #2
  Filled 2024-01-29: qty 16, 30d supply, fill #3

## 2023-10-04 NOTE — Patient Instructions (Addendum)
 Please review attached information for guidance about foods to avoid that may exacerbate your symptoms  Continue all of your current medications.  I would hesitate to add additional medications due to concern for interactions  I recommend that you be seen by a dentist to consider fitting a mouthguard to avoid grinding of teeth at night which can cause the symptoms  Try using Flonase  nasal spray to help with your earache  Schedule an appointment for a Pap smear at your convenience

## 2023-10-17 ENCOUNTER — Other Ambulatory Visit: Payer: Self-pay

## 2023-10-17 MED ORDER — OXYCODONE HCL 5 MG PO TABS
5.0000 mg | ORAL_TABLET | Freq: Four times a day (QID) | ORAL | 0 refills | Status: DC | PRN
  Filled 2023-10-17: qty 40, 5d supply, fill #0

## 2023-10-17 NOTE — Addendum Note (Signed)
 Addended by: GLENDIA DAMIEN SQUIBB on: 10/17/2023 08:26 AM   Modules accepted: Orders

## 2023-11-09 NOTE — Progress Notes (Signed)
 [name]                                          MRN: [id number]   11/09/2023   The VBCI Quality Team Specialist reviewed this patient medical record for the purposes of chart review for care gap closure. The following were reviewed: chart review for care gap closure-kidney health evaluation for diabetes:eGFR  and uACR.    VBCI Quality Team

## 2023-11-13 ENCOUNTER — Other Ambulatory Visit: Payer: Self-pay | Admitting: Family Medicine

## 2023-11-13 DIAGNOSIS — G8929 Other chronic pain: Secondary | ICD-10-CM

## 2023-11-13 DIAGNOSIS — E0810 Diabetes mellitus due to underlying condition with ketoacidosis without coma: Secondary | ICD-10-CM

## 2023-11-13 DIAGNOSIS — I10 Essential (primary) hypertension: Secondary | ICD-10-CM

## 2023-11-13 DIAGNOSIS — R52 Pain, unspecified: Secondary | ICD-10-CM

## 2023-11-13 DIAGNOSIS — M174 Other bilateral secondary osteoarthritis of knee: Secondary | ICD-10-CM

## 2023-11-14 ENCOUNTER — Other Ambulatory Visit: Payer: Self-pay

## 2023-11-14 ENCOUNTER — Other Ambulatory Visit (HOSPITAL_COMMUNITY): Payer: Self-pay

## 2023-11-15 ENCOUNTER — Other Ambulatory Visit (HOSPITAL_COMMUNITY): Payer: Self-pay

## 2023-11-15 ENCOUNTER — Other Ambulatory Visit: Payer: Self-pay

## 2023-11-15 MED ORDER — OXYBUTYNIN CHLORIDE 5 MG PO TABS
5.0000 mg | ORAL_TABLET | Freq: Two times a day (BID) | ORAL | 1 refills | Status: DC
  Filled 2023-11-15: qty 60, 30d supply, fill #0
  Filled 2023-12-15: qty 60, 30d supply, fill #1

## 2023-11-15 MED ORDER — DICLOFENAC SODIUM 50 MG PO TBEC
50.0000 mg | DELAYED_RELEASE_TABLET | Freq: Two times a day (BID) | ORAL | 0 refills | Status: DC | PRN
Start: 2023-11-15 — End: 2023-12-15
  Filled 2023-11-15: qty 60, 30d supply, fill #0

## 2023-11-15 MED ORDER — OXYCODONE HCL 5 MG PO TABS
5.0000 mg | ORAL_TABLET | Freq: Four times a day (QID) | ORAL | 0 refills | Status: DC | PRN
  Filled 2023-11-15: qty 40, 5d supply, fill #0

## 2023-11-22 NOTE — Progress Notes (Signed)
 [name]                                          MRN: [id number]   11/22/2023   The VBCI Quality Team Specialist reviewed this patient medical record for the purposes of chart review for care gap closure. The following were reviewed: chart review for care gap closure-colorectal cancer screening.    VBCI Quality Team

## 2023-11-24 ENCOUNTER — Ambulatory Visit: Payer: 59

## 2023-11-24 ENCOUNTER — Telehealth: Payer: Self-pay

## 2023-11-24 VITALS — Ht 63.0 in | Wt 325.0 lb

## 2023-11-24 DIAGNOSIS — R32 Unspecified urinary incontinence: Secondary | ICD-10-CM

## 2023-11-24 DIAGNOSIS — Z0000 Encounter for general adult medical examination without abnormal findings: Secondary | ICD-10-CM | POA: Diagnosis not present

## 2023-11-24 NOTE — Telephone Encounter (Signed)
 Patient is requesting a rx for XL Pull-Ups to be sent to [facility]. Patient provided only the phone number: [phone] or [phone].  Annalycia Done N. Tomie, LPN [facility] Annual Wellness Team Direct Dial : [phone]

## 2023-11-24 NOTE — Progress Notes (Signed)
 Because this visit was a virtual/telehealth visit,  certain criteria was not obtained, such a blood pressure, CBG if applicable, and timed get up and go. Any medications not marked as taking were not mentioned during the medication reconciliation part of the visit. Any vitals not documented were not able to be obtained due to this being a telehealth visit or patient was unable to self-report a recent blood pressure reading due to a lack of equipment at home via telehealth. Vitals that have been documented are verbally provided by the patient.   Subjective:   [name] is a 58 y.o. who presents for a Medicare Wellness preventive visit.  As a reminder, Annual Wellness Visits don't include a physical exam, and some assessments may be limited, especially if this visit is performed virtually. We may recommend an in-person follow-up visit with your provider if needed.  Visit Complete: Virtual I connected with  Brittiny A Breth on [birth year] by a audio enabled telemedicine application and verified that I am speaking with the correct person using two identifiers.  Patient Location: Home  Provider Location: Home Office  I discussed the limitations of evaluation and management by telemedicine. The patient expressed understanding and agreed to proceed.  Vital Signs: Because this visit was a virtual/telehealth visit, some criteria may be missing or patient reported. Any vitals not documented were not able to be obtained and vitals that have been documented are patient reported.  VideoDeclined- This patient declined Librarian, academic. Therefore the visit was completed with audio only.  Persons Participating in Visit: Patient.  AWV Questionnaire: No: Patient Medicare AWV questionnaire was not completed prior to this visit.  Cardiac Risk Factors include: advanced age (>79men, >37 women);sedentary lifestyle;diabetes mellitus;dyslipidemia;hypertension;obesity (BMI  >30kg/m2);family history of premature cardiovascular disease     Objective:    Today's Vitals   11/24/23 1335  Weight: (!) 325 lb (147.4 kg)  Height: 5' 3 (1.6 m)  PainSc: 8   PainLoc: Generalized   Body mass index is 57.57 kg/m.     11/24/2023    1:42 PM 10/04/2023    1:46 PM 07/05/2023    3:19 PM 06/26/2023   12:11 AM 11/05/2022    3:56 PM 08/24/2022    3:46 PM 03/05/2022    4:16 PM  Advanced Directives  Does Patient Have a Medical Advance Directive? No No No No No No No  Would patient like information on creating a medical advance directive? No - Patient declined No - Patient declined No - Patient declined  No - Patient declined  No - Patient declined    Current Medications (verified) Outpatient Encounter Medications as of 11/24/2023  Medication Sig   acetaminophen  (TYLENOL ) 500 MG tablet Take 1 tablet (500 mg total) by mouth every 6 (six) hours as needed.   albuterol  (PROAIR  HFA) 108 (90 Base) MCG/ACT inhaler Inhale 2 puffs into the lungs every 4 (four) hours as needed.   amLODipine  (NORVASC ) 5 MG tablet Take 1 tablet (5 mg total) by mouth at bedtime.   amoxicillin (AMOXIL) 500 MG capsule Take 500 mg by mouth every 8 (eight) hours. (Patient not taking: Reported on 06/24/2023)   atorvastatin  (LIPITOR) 40 MG tablet Take 2 tablets (80 mg total) by mouth daily.   Blood Glucose Monitoring Suppl (ACCU-CHEK GUIDE) w/Device KIT Please use to check blood sugar up to four times daily. (Patient not taking: Reported on 09/23/2021)   budesonide -formoterol  (SYMBICORT ) 160-4.5 MCG/ACT inhaler Inhale 2 puffs into the lungs 2 (two) times  daily.   buPROPion  (WELLBUTRIN  XL) 300 MG 24 hr tablet Take 1 tablet (300 mg total) by mouth daily.   cetirizine  (ZYRTEC ) 10 MG tablet Take 1 tablet (10 mg total) by mouth daily.   chlorhexidine  (PERIDEX ) 0.12 % solution RINSE WITH FOR 30 SECONDS TWICE A DAY AM & PM FOR ONLY 2 WKS THEN STOP, SPIT OUT, DO NO SWALLOW (Patient not taking: Reported on 06/24/2023)    Continuous Glucose Receiver (DEXCOM G7 RECEIVER) DEVI Use as directed   Continuous Glucose Sensor (DEXCOM G7 SENSOR) MISC Apply 1 sensor every 10 days   cycloSPORINE  (RESTASIS  MULTIDOSE) 0.05 % ophthalmic emulsion Place 1 drop into both eyes 2 (two) times daily. (Patient not taking: Reported on 06/24/2023)   DENTA 5000 PLUS 1.1 % CREA dental cream Take by mouth as directed.   diazepam  (VALIUM ) 5 MG tablet Take one tablet by mouth with light food one hour prior to procedure.   diclofenac  (VOLTAREN ) 50 MG EC tablet Take 1 tablet (50 mg total) by mouth 2 (two) times daily as needed.   empagliflozin  (JARDIANCE ) 10 MG TABS tablet Take 1 tablet by mouth daily.   fluticasone  (FLONASE ) 50 MCG/ACT nasal spray Place 2 sprays into both nostrils daily.   furosemide  (LASIX ) 20 MG tablet Take 1 tablet (20 mg total) by mouth daily as needed.   glucose blood (ACCU-CHEK GUIDE) test strip Use to check blood sugar in the morning, at noon and at bedtime as directed (Patient not taking: Reported on 06/24/2023)   hydrochlorothiazide  (HYDRODIURIL ) 25 MG tablet Take 1 tablet (25 mg total) by mouth daily.   hydrOXYzine  (ATARAX ) 50 MG tablet Take 1 - 2 tablets (50 - 100 mg total) by mouth 3 times daily as needed for anxiety.   insulin  glargine (LANTUS  SOLOSTAR) 100 UNIT/ML Solostar Pen Inject 30 Units into the skin daily.   Insulin  Pen Needle (INSUPEN PEN NEEDLES) 31G X 5 MM MISC Use to inject Lantus  Solostar as directed.   irbesartan  (AVAPRO ) 150 MG tablet Take 1 tablet (150 mg total) by mouth at bedtime.   lidocaine  (LIDODERM ) 5 % Place 1 patch onto the skin daily. Remove & Discard patch within 12 hours or as directed by MD   montelukast  (SINGULAIR ) 10 MG tablet Take 1 tablet (10 mg total) by mouth at bedtime.   omeprazole  (PRILOSEC) 40 MG capsule Take 1 capsule (40 mg total) by mouth daily.   oxybutynin  (DITROPAN ) 5 MG tablet Take 1 tablet (5 mg total) by mouth 2 (two) times daily.   oxyCODONE  (OXY IR/ROXICODONE ) 5 MG  immediate release tablet Take 1-2 tablets (5-10 mg total) by mouth every 6 (six) hours as needed for severe pain (pain score 7-10) or breakthrough pain.   pregabalin  (LYRICA ) 75 MG capsule Take 1 capsule (75 mg total) by mouth 2 (two) times daily. Back down to once daily if you become drowsy.   tirzepatide  (MOUNJARO ) 15 MG/0.5ML Pen Inject 15 mg into the skin once a week.   No facility-administered encounter medications on file as of 11/24/2023.    Allergies (verified) Apple juice, Banana, Metformin  and related, and Shrimp [shellfish allergy]   History: Past Medical History:  Diagnosis Date   ALLERGIC RHINITIS    takes Zyrtec  daily   Anxiety    Arthritis    Asthma    Albuterol  prn;Symbicort  daily and Singulair  daily   Bronchitis    hx of   CAP (community acquired pneumonia) 02/20/2015   Carpal tunnel syndrome    right  Chronic back pain    Constipation    Miralax  prn   Depression    takes Cymbalta  daily   Diabetes (HCC)    type 2    Eczema    Gallstones 2010   GERD (gastroesophageal reflux disease)    takes Omeprazole  daily   Hemorrhoids    Hypertension    takes Prinizide daily and Clonidine  on Mondays   Hypoventilation associated with obesity syndrome (HCC)    Insomnia    Irritable bladder    Joint pain    Morbid (severe) obesity due to excess calories (HCC) 04/10/2007   Restrictive changes on pfts 11/30/2007     Myasthenia gravis 1994   Positive Acetylcholine receptor Ab and single fiber EMG Dr. Lynwood Barrio [facility] 1996- Dr. 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 CXR improved/stable. PFT wnl. I am happy patient had these tests and we have a baseline for her. At this time, I do not see an indication for starting chronic steroids daily. Patient agrees. Her skin lesions are bothering her the most and syst   Shortness of breath    can be sitting as well as exertion   Skin irritation    skin itchy   Trigger finger    Urinary frequency    takes Ditropan  daily   Viral meningitis    history of viral meningitis   Past Surgical History:  Procedure Laterality Date   BRONCHOSCOPY  08/2007   CARPAL TUNNEL RELEASE  05/20/2011   Procedure: CARPAL TUNNEL RELEASE;  Surgeon: Eva Elsie Herring, MD;  Location: [facility] OR;  Service: Orthopedics;  Laterality: Right;   CARPAL TUNNEL RELEASE Right 11/22/2019   Procedure: RIGHT HAND CARPAL TUNNEL RELEASE;  Surgeon: Herring Eva, MD;  Location: [facility] ORS;  Service: Orthopedics;  Laterality: Right;   CARPAL TUNNEL RELEASE Left 02/25/2022   Procedure: CARPAL TUNNEL RELEASE;  Surgeon: Herring Eva, MD;  Location: [facility];  Service: Orthopedics;  Laterality: Left;   CESAREAN SECTION  09/15/2004   COLONOSCOPY N/A 01/21/2014   Procedure: COLONOSCOPY;  Surgeon: Lupita FORBES Commander, MD;  Location: [facility];  Service: Endoscopy;  Laterality: N/A;   cortisone injection     receives an injection every 3months   ESOPHAGOGASTRODUODENOSCOPY     ESOPHAGOGASTRODUODENOSCOPY N/A 01/21/2014   Procedure: ESOPHAGOGASTRODUODENOSCOPY (EGD);  Surgeon: Lupita FORBES Commander, MD;  Location: [facility];  Service: Endoscopy;  Laterality: N/A;   LAPAROSCOPIC CHOLECYSTECTOMY  07/2008   by Dr. Sherlean Laughter   Thymus resection  08/25/1993   TRIGGER FINGER RELEASE  05/20/2011   Procedure:  RELEASE TRIGGER FINGER/A-1 PULLEY;  Surgeon: Eva Elsie Herring, MD;  Location: [facility] OR;  Service: Orthopedics;  Laterality: Right;  RIGHT TRIGGER THUMB RELEASE AND RIGHT CARPAL TUNNEL RELEASE  Family History  Problem Relation Age of Onset   Hypertension Father    Sickle cell trait Father    Diabetes Father        Borderline   Diabetes Maternal Grandmother    Stroke Paternal Grandmother    Sickle cell trait Paternal Grandfather    Diabetes Paternal Grandfather    Crohn's disease Paternal Aunt    Lupus Other        Mother's first cousin   Anesthesia problems Neg Hx    Hypotension Neg Hx    Malignant hyperthermia Neg Hx    Pseudochol deficiency Neg Hx    Colon cancer Neg Hx    Colon polyps Neg Hx    Heart disease Neg Hx    Kidney disease Neg Hx    Esophageal cancer Neg Hx    Gallbladder disease Neg Hx    Social History   Socioeconomic History   Marital status: Widowed    Spouse name: Not on file   Number of children: 4   Years of education: Not on file   Highest education level: Not on file  Occupational History   Occupation: Disabled  Tobacco Use   Smoking status: Never   Smokeless tobacco: Never  Vaping Use   Vaping status: Never Used  Substance and Sexual Activity   Alcohol use: No   Drug use: No   Sexual activity: Yes    Birth control/protection: Injection  Other Topics Concern   Not on file  Social History Narrative   Lives in a house with husband (s/p incarceration), daughter, 2 sons, and grandson, unemployed, unemployed, no pets, no tob, no etoh, no exercise, no healthy diet, more than or equal to 12 yrs education, christian, has motorized scooter that she uses periodically, husband smokes in the house   _   ___________________________________________________________________________________________________________________________________________   Update 04/20/2019   Husband is deceased, patient started grief counseling Jan. 2021   Lives with 6 year old  son; has difficulty with ADL's   Social Drivers of Health   Financial Resource Strain: Medium Risk (11/24/2023)   Overall Financial Resource Strain (CARDIA)    Difficulty of Paying Living Expenses: Somewhat hard  Food Insecurity: Food Insecurity Present (11/24/2023)   Hunger Vital Sign    Worried About Running Out of Food in the Last Year: Often true    Ran Out of Food in the Last Year: Often true  Transportation Needs: No Transportation Needs (11/24/2023)   PRAPARE - Administrator, Civil Service (Medical): No    Lack of Transportation (Non-Medical): No  Physical Activity: Inactive (11/24/2023)   Exercise Vital Sign    Days of Exercise per Week: 0 days    Minutes of Exercise per Session: 0 min  Stress: No Stress Concern Present (11/24/2023)   [employer] of Occupational Health - Occupational Stress Questionnaire    Feeling of Stress: Only a little  Social Connections: Moderately Integrated (11/24/2023)   Social Connection and Isolation Panel    Frequency of Communication with Friends and Family: More than three times a week    Frequency of Social Gatherings with Friends and Family: More than three times a week    Attends Religious Services: Patient declined    Database Administrator or Organizations: Yes    Attends Banker Meetings: Patient declined    Marital Status: Married    Tobacco Counseling Counseling given: Not Answered    Clinical Intake:  Pre-visit preparation completed: Yes  Pain :  0-10 Pain Score: 8  Pain Type: Chronic pain Pain Orientation: Right     BMI - recorded: 57.57 Nutritional Status: BMI > 30  Obese Nutritional Risks: None Diabetes: Yes CBG done?: No Did pt. bring in CBG monitor from home?: No  Lab Results  Component Value Date   HGBA1C 6.8 06/21/2023   HGBA1C 7.1 (A) 11/05/2022   HGBA1C 7.2 (A) 08/24/2022     How often do you need to have someone help you when you read instructions, pamphlets, or other  written materials from your doctor or pharmacy?: 1 - Never What is the last grade level you completed in school?: HSG  Interpreter Needed?: No  Information entered by :: Roz Fuller, LPN.   Activities of Daily Living     11/24/2023    1:42 PM  In your present state of health, do you have any difficulty performing the following activities:  Hearing? 1  Vision? 0  Difficulty concentrating or making decisions? 1  Walking or climbing stairs? 1  Dressing or bathing? 1  Doing errands, shopping? 1  Preparing Food and eating ? Y  Using the Toilet? Y  In the past six months, have you accidently leaked urine? Y  Comment WEARS PULLUPS  Do you have problems with loss of bowel control? Y  Comment WEARS PULL UPS  Managing your Medications? N  Managing your Finances? N  Housekeeping or managing your Housekeeping? Y    Patient Care Team: Romelle Booty, MD as PCP - General (Family Medicine) Jacques Sharper, MD as Consulting Physician (Ophthalmology)  I have updated your Care Teams any recent Medical Services you may have received from other providers in the past year.     Assessment:   This is a routine wellness examination for Laelani.  Hearing/Vision screen Hearing Screening - Comments:: Patient has issues with hearing difficulties. No hearing aids due to finances.  Vision Screening - Comments:: Wears rx glasses - up to date with routine eye exams with Dr. Sharper [facility])    Goals Addressed             This Visit's Progress    11/19/2023: To be in better health and to not have to use my wheelchair.         Depression Screen     11/24/2023    1:48 PM 10/04/2023    1:46 PM 07/05/2023    3:18 PM 06/21/2023   11:26 AM 11/22/2022    2:33 PM 11/05/2022    3:57 PM 08/24/2022    3:45 PM  PHQ 2/9 Scores  PHQ - 2 Score 0  3 4 3 3 4   PHQ- 9 Score 5  16 18 20 20 18   Exception Documentation  Patient refusal         Fall Risk     11/24/2023    1:41 PM  11/22/2022    2:29 PM 01/08/2022   11:29 AM 05/05/2020    2:50 PM 10/12/2019    3:20 PM  Fall Risk   Falls in the past year? 0 1 0 0 0  Number falls in past yr: 0 0 0    Injury with Fall? 0 0     Risk for fall due to : No Fall Risks;Impaired balance/gait;Impaired mobility      Follow up Falls evaluation completed Falls evaluation completed;Education provided       MEDICARE RISK AT HOME:  Medicare Risk at Home Any stairs in or around the home?: No (1ST  FLOOR) If so, are there any without handrails?: No Home free of loose throw rugs in walkways, pet beds, electrical cords, etc?: Yes Adequate lighting in your home to reduce risk of falls?: Yes Life alert?: No Use of a cane, walker or w/c?: Yes Grab bars in the bathroom?: Yes Shower chair or bench in shower?: Yes Elevated toilet seat or a handicapped toilet?: Yes  TIMED UP AND GO:  Was the test performed?  No  Cognitive Function: 6CIT completed    11/24/2023    1:47 PM 11/22/2022    2:48 PM  MMSE - Mini Mental State Exam  Not completed: Unable to complete Unable to complete        11/24/2023    1:54 PM 11/24/2023    1:38 PM 11/22/2022    2:32 PM  6CIT Screen  What Year? 0 points 0 points 0 points  What month? 0 points 0 points 0 points  What time? 0 points 0 points 0 points  Count back from 20 0 points 0 points 0 points  Months in reverse 0 points 0 points 0 points  Repeat phrase 0 points 0 points 0 points  Total Score 0 points 0 points 0 points    Immunizations Immunization History  Administered Date(s) Administered   H1N1 01/09/2008   Influenza Split 11/10/2010, 11/01/2011   Influenza Whole 10/25/2007, 11/15/2008, 12/02/2009   Influenza, Seasonal, Injecte, Preservative Fre 11/05/2022, 10/04/2023   Influenza,inj,Quad PF,6+ Mos 11/03/2012, 10/07/2013, 11/20/2014, 11/27/2015, 11/17/2016, 10/27/2017, 09/27/2018, 10/12/2019, 10/17/2020, 12/08/2021   Influenza-Unspecified 10/27/2017   Moderna Sars-Covid-2  Vaccination 04/03/2019, 05/03/2019, 12/07/2019   PFIZER Comirnaty (Gray Top)Covid-19 Tri-Sucrose Vaccine 05/16/2020, 12/08/2021   PNEUMOCOCCAL CONJUGATE-20 06/21/2023   PPD Test 12/23/2010   Pfizer Covid-19 Vaccine Bivalent Booster 86yrs & up 11/24/2020   Pfizer(Comirnaty )Fall Seasonal Vaccine 12 years and older 11/05/2022   Pneumococcal Polysaccharide-23 12/27/2012   Td 03/25/2005   Tdap 07/04/2017   Zoster Recombinant(Shingrix) 12/20/2020, 04/16/2021    Screening Tests Health Maintenance  Topic Date Due   OPHTHALMOLOGY EXAM  Never done   Hepatitis B Vaccines 19-59 Average Risk (1 of 3 - 19+ 3-dose series) Never done   Cervical Cancer Screening (HPV/Pap Cotest)  07/05/2022   COVID-19 Vaccine (8 - 2025-26 season) 09/26/2023   Diabetic kidney evaluation - Urine ACR  11/05/2023   Medicare Annual Wellness (AWV)  11/22/2023   FOOT EXAM  11/05/2023   Colonoscopy  01/22/2024   HEMOGLOBIN A1C  12/22/2023   Diabetic kidney evaluation - eGFR measurement  06/25/2024   Mammogram  09/12/2025   DTaP/Tdap/Td (3 - Td or Tdap) 07/05/2027   Pneumococcal Vaccine: 50+ Years  Completed   Influenza Vaccine  Completed   Hepatitis C Screening  Completed   HIV Screening  Completed   Zoster Vaccines- Shingrix  Completed   HPV VACCINES  Aged Out   Meningococcal B Vaccine  Aged Out    Health Maintenance Items Addressed: Vaccines Due: Covid and Hepatitis B Series, Diabetic Foot Exam recommended, Labs Due Urine ACR, and Pap Smear  Additional Screening:  Vision Screening: Recommended annual ophthalmology exams for early detection of glaucoma and other disorders of the eye. Is the patient up to date with their annual eye exam?  Yes  Who is the provider or what is the name of the office in which the patient attends annual eye exams? Ozell Mirza, MD.  Dental Screening: Recommended annual dental exams for proper oral hygiene  Community Resource Referral / Chronic Care Management: CRR required this  visit?  No   CCM required this visit?  No   Plan:    I have personally reviewed and noted the following in the patient's chart:   Medical and social history Use of alcohol, tobacco or illicit drugs  Current medications and supplements including opioid prescriptions. Patient is not currently taking opioid prescriptions. Functional ability and status Nutritional status Physical activity Advanced directives List of other physicians Hospitalizations, surgeries, and ER visits in previous 12 months Vitals Screenings to include cognitive, depression, and falls Referrals and appointments  In addition, I have reviewed and discussed with patient certain preventive protocols, quality metrics, and best practice recommendations. A written personalized care plan for preventive services as well as general preventive health recommendations were provided to patient.   Roz LOISE Fuller, LPN   [id number]   After Visit Summary: (MyChart) Due to this being a telephonic visit, the after visit summary with patients personalized plan was offered to patient via MyChart   Notes: Vaccines Due: Covid and Hepatitis B Series, Diabetic Foot Exam recommended, Labs Due Urine ACR, and Pap Smear.

## 2023-11-24 NOTE — Patient Instructions (Signed)
 [name],  Thank you for taking the time for your Medicare Wellness Visit. I appreciate your continued commitment to your health goals. Please review the care plan we discussed, and feel free to reach out if I can assist you further.  Medicare recommends these wellness visits once per year to help you and your care team stay ahead of potential health issues. These visits are designed to focus on prevention, allowing your provider to concentrate on managing your acute and chronic conditions during your regular appointments.  Please note that Annual Wellness Visits do not include a physical exam. Some assessments may be limited, especially if the visit was conducted virtually. If needed, we may recommend a separate in-person follow-up with your provider.  Ongoing Care Seeing your primary care provider every 3 to 6 months helps us  monitor your health and provide consistent, personalized care.   Referrals If a referral was made during today's visit and you haven't received any updates within two weeks, please contact the referred provider directly to check on the status.  Recommended Screenings:  Health Maintenance  Topic Date Due   Eye exam for diabetics  Never done   Hepatitis B Vaccine (1 of 3 - 19+ 3-dose series) Never done   Pap with HPV screening  07/05/2022   COVID-19 Vaccine (8 - 2025-26 season) 09/26/2023   Yearly kidney health urinalysis for diabetes  11/05/2023   Complete foot exam   11/05/2023   Colon Cancer Screening  01/22/2024   Hemoglobin A1C  12/22/2023   Yearly kidney function blood test for diabetes  06/25/2024   Medicare Annual Wellness Visit  11/23/2024   Breast Cancer Screening  09/12/2025   DTaP/Tdap/Td vaccine (3 - Td or Tdap) 07/05/2027   Pneumococcal Vaccine for age over 68  Completed   Flu Shot  Completed   Hepatitis C Screening  Completed   HIV Screening  Completed   Zoster (Shingles) Vaccine  Completed   HPV Vaccine  Aged Out   Meningitis B Vaccine   Aged Out       11/24/2023    1:42 PM  Advanced Directives  Does Patient Have a Medical Advance Directive? No  Would patient like information on creating a medical advance directive? No - Patient declined   Advance Care Planning is important because it: Ensures you receive medical care that aligns with your values, goals, and preferences. Provides guidance to your family and loved ones, reducing the emotional burden of decision-making during critical moments.  Vision: Annual vision screenings are recommended for early detection of glaucoma, cataracts, and diabetic retinopathy. These exams can also reveal signs of chronic conditions such as diabetes and high blood pressure.  Dental: Annual dental screenings help detect early signs of oral cancer, gum disease, and other conditions linked to overall health, including heart disease and diabetes.  Please see the attached documents for additional preventive care recommendations.

## 2023-11-25 NOTE — Addendum Note (Signed)
 Addended byBETHA COWARD, Virgen Belland on: 11/25/2023 11:52 AM   Modules accepted: Orders

## 2023-11-28 ENCOUNTER — Encounter: Payer: Self-pay | Admitting: Radiology

## 2023-12-01 NOTE — Telephone Encounter (Signed)
 Contacted patient.   She reports this will be a new order. She reports she has not received supplies in over 9 months.   Advised she may need an office visit documenting the need for supplies.   She reports she will call them and have the order faxed to our office to make it easier.   She declined to make an apt.   Will await fax, however they may require office notes.

## 2023-12-12 ENCOUNTER — Telehealth: Payer: Self-pay | Admitting: Pharmacist

## 2023-12-12 ENCOUNTER — Encounter: Payer: Self-pay | Admitting: Pharmacist

## 2023-12-12 NOTE — Telephone Encounter (Signed)
 Attempted to contact patient for follow-up to schedule appointment with PCP for DM QI'25   Left HIPAA compliant voice mail requesting call back to direct phone: [phone]  Total time with patient call and documentation of interaction: 3 minutes.

## 2023-12-15 ENCOUNTER — Other Ambulatory Visit: Payer: Self-pay | Admitting: Family Medicine

## 2023-12-15 ENCOUNTER — Other Ambulatory Visit: Payer: Self-pay

## 2023-12-15 ENCOUNTER — Encounter: Payer: Self-pay | Admitting: Family Medicine

## 2023-12-15 ENCOUNTER — Other Ambulatory Visit (HOSPITAL_COMMUNITY): Payer: Self-pay

## 2023-12-15 DIAGNOSIS — G8929 Other chronic pain: Secondary | ICD-10-CM

## 2023-12-15 DIAGNOSIS — R52 Pain, unspecified: Secondary | ICD-10-CM

## 2023-12-15 DIAGNOSIS — M174 Other bilateral secondary osteoarthritis of knee: Secondary | ICD-10-CM

## 2023-12-15 MED ORDER — OXYCODONE HCL 5 MG PO TABS
5.0000 mg | ORAL_TABLET | Freq: Four times a day (QID) | ORAL | 0 refills | Status: DC | PRN
  Filled 2023-12-15: qty 40, 5d supply, fill #0

## 2023-12-15 MED ORDER — DICLOFENAC SODIUM 50 MG PO TBEC
50.0000 mg | DELAYED_RELEASE_TABLET | Freq: Two times a day (BID) | ORAL | 0 refills | Status: DC | PRN
  Filled 2023-12-15: qty 60, 30d supply, fill #0

## 2023-12-16 ENCOUNTER — Other Ambulatory Visit (HOSPITAL_COMMUNITY): Payer: Self-pay

## 2023-12-16 ENCOUNTER — Encounter: Payer: Self-pay | Admitting: Pharmacist

## 2023-12-16 ENCOUNTER — Other Ambulatory Visit: Payer: Self-pay

## 2023-12-16 ENCOUNTER — Other Ambulatory Visit (HOSPITAL_BASED_OUTPATIENT_CLINIC_OR_DEPARTMENT_OTHER): Payer: Self-pay

## 2023-12-16 NOTE — Progress Notes (Signed)
 This patient is appearing on a report for being at risk of failing the adherence measure for cholesterol (statin) medications this calendar year.   Medication: atorvastatin  40 mg  Last fill date: 12/15/23 for 90 day supply  Completed chart review

## 2023-12-20 NOTE — Progress Notes (Signed)
 [name]                                          MRN: [id number]   12/20/2023   The [facility] Quality Team Specialist reviewed this patient medical record for the purposes of chart review for care gap closure. The following were reviewed: abstraction for care gap closure-glycemic status assessment.    [facility]BCI Quality Team

## 2023-12-21 ENCOUNTER — Other Ambulatory Visit (HOSPITAL_COMMUNITY): Payer: Self-pay

## 2023-12-21 ENCOUNTER — Other Ambulatory Visit: Payer: Self-pay

## 2023-12-29 ENCOUNTER — Encounter: Payer: Self-pay | Admitting: Pharmacist

## 2023-12-29 ENCOUNTER — Ambulatory Visit: Admitting: Pharmacist

## 2023-12-29 VITALS — BP 110/73 | HR 94 | Wt 318.0 lb

## 2023-12-29 DIAGNOSIS — E1165 Type 2 diabetes mellitus with hyperglycemia: Secondary | ICD-10-CM

## 2023-12-29 DIAGNOSIS — E119 Type 2 diabetes mellitus without complications: Secondary | ICD-10-CM

## 2023-12-29 DIAGNOSIS — E1169 Type 2 diabetes mellitus with other specified complication: Secondary | ICD-10-CM

## 2023-12-29 DIAGNOSIS — E785 Hyperlipidemia, unspecified: Secondary | ICD-10-CM

## 2023-12-29 DIAGNOSIS — Z794 Long term (current) use of insulin: Secondary | ICD-10-CM

## 2023-12-29 DIAGNOSIS — J45909 Unspecified asthma, uncomplicated: Secondary | ICD-10-CM

## 2023-12-29 DIAGNOSIS — I10 Essential (primary) hypertension: Secondary | ICD-10-CM | POA: Diagnosis not present

## 2023-12-29 LAB — POCT GLYCOSYLATED HEMOGLOBIN (HGB A1C): HbA1c, POC (controlled diabetic range): 6.7 % (ref 0.0–7.0)

## 2023-12-29 MED ORDER — AMLODIPINE BESYLATE 2.5 MG PO TABS
2.5000 mg | ORAL_TABLET | Freq: Every day | ORAL | 3 refills | Status: AC
  Filled 2023-12-29: qty 90, 90d supply, fill #0
  Filled 2024-01-29: qty 90, 90d supply, fill #1

## 2023-12-29 MED ORDER — LANTUS SOLOSTAR 100 UNIT/ML ~~LOC~~ SOPN
24.0000 [IU] | PEN_INJECTOR | Freq: Every day | SUBCUTANEOUS | 3 refills | Status: AC
  Filled 2023-12-29 – 2024-01-29 (×2): qty 15, 62d supply, fill #0

## 2023-12-29 MED ORDER — EMPAGLIFLOZIN 10 MG PO TABS
10.0000 mg | ORAL_TABLET | Freq: Every day | ORAL | 3 refills | Status: AC
  Filled 2023-12-29: qty 90, 90d supply, fill #0

## 2023-12-29 NOTE — Assessment & Plan Note (Signed)
 Hypertension longstanding and currently well controlled with systolic ~ 110 mm Hg. Blood pressure goal of <130/80 mmHg. Medication adherence appears good. Lower extremity swelling could be worsened by use of amlodipine .   - Reduce amlodipine  from 5 to 2.5 mg daily - Continue irbesartan  150mg  daily and hydrochlorothiazide  25mg  daily - Patient interested in combination pill in the future to reduce pill burden (unfortunately no combination with amlodipine  2.5mg )  - Consider ARB thiazide combination at next visit.  - Check BMET today

## 2023-12-29 NOTE — Assessment & Plan Note (Signed)
 Asthma - stable however patient is minimally using maintenance inhaler.  - Advised and encouraged to use Symbicort  (formoterol  / budesonide ) 2 puffs at least daily.  - Advised to use Symbicort  (formoterol  / budesonide ) as rescue inhaler

## 2023-12-29 NOTE — Progress Notes (Signed)
 S:     Chief Complaint  Patient presents with   Medication Management    Diabetes   58 y.o. female who presents for diabetes evaluation, education, and management. Patient arrives in good spirits and presents with assistance of a wheel chair. Patient is accompanied by her son JuJu.    Patient was referred and last seen by Primary Care Provider, Dr. Romelle, on 10/04/2023.   PMH is significant for OA, diabetes, hyperlipidemia, hypertension and asthma    Current diabetes medications include: Lantus  (insulin  glargine) 30 units once daily, Mounjaro  (tirzepatide ) 15mg  weekly and Jardiance  (empagliflozin ) 10mg  daily.  Current hypertension medications include: amlodipine  5mg , irbesartan  150mg  daily and hydrochlorothiazide  25mg  daily.  Current hyperlipidemia medications include: atorvastatin  40mg   Patient reports adherence to taking all medications as prescribed.    Do you feel that your medications are working for you? yes Have you been experiencing any side effects to the medications prescribed? no Do you have any problems obtaining medications due to transportation or finances? no   Patient reports rare hypoglycemic events.   Patient-reported exercise habits: limited due to BMI or 56  O:   Review of Systems  Musculoskeletal:  Positive for joint pain (knees).  All other systems reviewed and are negative.   Physical Exam Vitals reviewed.  Constitutional:      Appearance: Normal appearance.  Pulmonary:     Effort: Pulmonary effort is normal.  Musculoskeletal:     Right lower leg: Edema (1+) present.     Left lower leg: Edema (1+) present.  Neurological:     Mental Status: She is alert.  Psychiatric:        Mood and Affect: Mood normal.        Behavior: Behavior normal.        Thought Content: Thought content normal.        Judgment: Judgment normal.     Libre3 CGM Download today 12/29/2023 % Time CGM is active: 80% Average Glucose: 129 mg/dL Glucose Management  Indicator: 6.4  Glucose Variability: 16.5% (goal <36%) Time in Goal:  - Time in range 70-180: 98% - Time above range: 2% - Time below range: 0% Observed patterns: excellent control with minimal low readings.   Lab Results  Component Value Date   HGBA1C 6.7 12/29/2023   Vitals:   12/29/23 1429  BP: 110/73  Pulse: 94  SpO2: 100%    Lipid Panel     Component Value Date/Time   CHOL 121 03/11/2021 1704   TRIG 119 03/11/2021 1704   HDL 33 (L) 03/11/2021 1704   CHOLHDL 3.7 03/11/2021 1704   CHOLHDL 4.5 11/27/2015 1408   VLDL 27 11/27/2015 1408   LDLCALC 66 03/11/2021 1704   LDLDIRECT 78 11/24/2020 1702   Patient is participating in a Managed Medicaid Plan:  Yes   A/P: Diabetes longstanding and currently with excellent control. Patient was excited to hear of 7 pound weight loss since last visit. She was also happy to see her GMI of 6.4 and A1C of 6.7 today.  She was able to verbalize appropriate hypoglycemia management plan. Medication adherence appears good. Control is suboptimal as patient is eating to avoid low readings due to insulin  doses. - Reduced basal insulin  Basaglar  (insulin  glargine) from 30 units to 24 units daily  - new prescription - Continued GLP/GIP Mounjaro  (tirzepatide ) at 15 mg weekly  - Continued SGLT2-I Jardiance  (empagliflozin ) 10 mg daily and refilled. Counseled on sick day rules. -Extensively discussed pathophysiology of diabetes, recommended lifestyle  interventions, dietary effects on blood sugar control.  -Counseled on s/sx of and management of hypoglycemia.  - Check UACR today.   Hyperlipidemia - chronic Last lipid panel > 1 year ago. - Continue Atorvastatin  40mg  once daily  - Check Lipid panel today  Hypertension longstanding and currently well controlled with systolic ~ 110 mm Hg. Blood pressure goal of <130/80 mmHg. Medication adherence appears good. Lower extremity swelling could be worsened by use of amlodipine .   - Reduce amlodipine  from 5 to  2.5 mg daily - Continue irbesartan  150mg  daily and hydrochlorothiazide  25mg  daily - Patient interested in combination pill in the future to reduce pill burden (unfortunately no combination with amlodipine  2.5mg )  - Consider ARB thiazide combination at next visit.  - Check BMET today  Asthma - stable however patient is minimally using maintenance inhaler.  - Advised and encouraged to use Symbicort  (formoterol  / budesonide ) 2 puffs at least daily.  - Advised to use Symbicort  (formoterol  / budesonide ) as rescue inhaler   Written patient instructions provided. Patient verbalized understanding of treatment plan.  Total time in face to face counseling 37 minutes.    Follow-up:  Pharmacist PRN - first part of 2026 PCP clinic visit in 01/30/2024

## 2023-12-29 NOTE — Patient Instructions (Signed)
 It was nice to see you today!  Your goal blood sugar is 80-130 before eating and less than 180 after eating.  Medication Changes: Please take your symbicort  2 puffs AT least 1 time per day.   Decrease amlodipine  to 2.5mg  once daily Decrease Lantus  (insulin  glargine) to 24 units once daily  Continue all other medication the same.   Keep up the good work with diet and exercise. Aim for a diet full of vegetables, fruit and lean meats (chicken, turkey, fish). Try to limit salt intake by eating fresh or frozen vegetables (instead of canned), rinse canned vegetables prior to cooking and do not add any additional salt to meals.

## 2023-12-29 NOTE — Assessment & Plan Note (Signed)
 Hyperlipidemia - chronic - Last lipid panel > 1 year ago. - Continue Atorvastatin  40mg  once daily  - Check Lipid panel today

## 2023-12-29 NOTE — Assessment & Plan Note (Signed)
 Diabetes longstanding and currently with excellent control. Patient was excited to hear of 7 pound weight loss since last visit. She was also happy to see her GMI of 6.4 and A1C of 6.7 today.  She was able to verbalize appropriate hypoglycemia management plan. Medication adherence appears good. Control is suboptimal as patient is eating to avoid low readings due to insulin  doses. - Reduced basal insulin  Basaglar  (insulin  glargine) from 30 units to 24 units daily  - new prescription - Continued GLP/GIP Mounjaro  (tirzepatide ) at 15 mg weekly  - Continued SGLT2-I Jardiance  (empagliflozin ) 10 mg daily and refilled. Counseled on sick day rules. -Extensively discussed pathophysiology of diabetes, recommended lifestyle interventions, dietary effects on blood sugar control.  -Counseled on s/sx of and management of hypoglycemia.  - Check UACR today.

## 2023-12-30 ENCOUNTER — Other Ambulatory Visit: Payer: Self-pay

## 2023-12-30 ENCOUNTER — Other Ambulatory Visit (HOSPITAL_COMMUNITY): Payer: Self-pay

## 2023-12-30 LAB — LIPID PANEL
Chol/HDL Ratio: 4 ratio (ref 0.0–4.4)
Cholesterol, Total: 117 mg/dL (ref 100–199)
HDL: 29 mg/dL — ABNORMAL LOW (ref >39)
LDL Chol Calc (NIH): 62 mg/dL (ref 0–99)
Triglycerides: 146 mg/dL (ref 0–149)
VLDL Cholesterol Cal: 26 mg/dL (ref 5–40)

## 2023-12-30 LAB — BASIC METABOLIC PANEL WITH GFR
BUN/Creatinine Ratio: 9 (ref 9–23)
BUN: 8 mg/dL (ref 6–24)
CO2: 24 mmol/L (ref 20–29)
Calcium: 9.3 mg/dL (ref 8.7–10.2)
Chloride: 102 mmol/L (ref 96–106)
Creatinine, Ser: 0.87 mg/dL (ref 0.57–1.00)
Glucose: 118 mg/dL — ABNORMAL HIGH (ref 70–99)
Potassium: 3.5 mmol/L (ref 3.5–5.2)
Sodium: 139 mmol/L (ref 134–144)
eGFR: 78 mL/min/1.73 (ref >59)

## 2023-12-30 LAB — MICROALBUMIN / CREATININE URINE RATIO
Creatinine, Urine: 134.9 mg/dL
Microalb/Creat Ratio: 4 mg/g{creat} (ref 0–29)
Microalbumin, Urine: 5.4 ug/mL

## 2023-12-30 NOTE — Progress Notes (Signed)
 Reviewed and agree with Dr Rennis plan.

## 2024-01-02 ENCOUNTER — Other Ambulatory Visit (HOSPITAL_COMMUNITY): Payer: Self-pay

## 2024-01-02 ENCOUNTER — Other Ambulatory Visit: Payer: Self-pay

## 2024-01-02 ENCOUNTER — Ambulatory Visit: Payer: Self-pay | Admitting: Pharmacist

## 2024-01-02 MED ORDER — ROSUVASTATIN CALCIUM 40 MG PO TABS
40.0000 mg | ORAL_TABLET | Freq: Every day | ORAL | 3 refills | Status: AC
  Filled 2024-01-02: qty 90, 90d supply, fill #0

## 2024-01-02 NOTE — Progress Notes (Signed)
 [name]                                          MRN: [id number]   01/02/2024   The [facility]BC[facility] Quality Team Specialist reviewed this patient medical record for the purposes of chart review for care gap closure. The following were reviewed: abstraction for care gap closure-glycemic status assessment and kidney health evaluation for diabetes:eGFR  and uACR.    [facility]BCI Quality Team

## 2024-01-02 NOTE — Telephone Encounter (Signed)
 Patient contacted for follow-up of recent lab results.   Shared results of multiple, normal or unchanged lab results.   Following discussion of LDL cholesterol, patient asked if there was something smaller in tablet size than the atorvastatin  40mg  (taking 2 for a dose of 80mg ), we agreed to change to rosuvastatin  40mg  once daily in place of the atorvastatin  80mg  daily.   Medication Plan: -START rosuvastatin  40mg  daily - new prescription provided.  -stop atorvastatin .   Total time with patient call and documentation of interaction: 12 minutes.

## 2024-01-02 NOTE — Telephone Encounter (Signed)
-----   Message from [facility] McDiarmid sent at 12/30/2023  3:33 PM EST -----  ----- Message ----- From: [place] Casimir BROCKS, CMA Sent: 12/29/2023   3:03 PM EST To: Krystal BIRCH McDiarmid, MD

## 2024-01-16 ENCOUNTER — Encounter: Payer: Self-pay | Admitting: Internal Medicine

## 2024-01-27 ENCOUNTER — Encounter: Payer: Self-pay | Admitting: Internal Medicine

## 2024-01-29 ENCOUNTER — Other Ambulatory Visit (HOSPITAL_COMMUNITY): Payer: Self-pay

## 2024-01-29 ENCOUNTER — Other Ambulatory Visit: Payer: Self-pay | Admitting: Family Medicine

## 2024-01-29 DIAGNOSIS — R062 Wheezing: Secondary | ICD-10-CM

## 2024-01-29 DIAGNOSIS — G8929 Other chronic pain: Secondary | ICD-10-CM

## 2024-01-29 DIAGNOSIS — E0810 Diabetes mellitus due to underlying condition with ketoacidosis without coma: Secondary | ICD-10-CM

## 2024-01-29 DIAGNOSIS — I10 Essential (primary) hypertension: Secondary | ICD-10-CM

## 2024-01-29 DIAGNOSIS — M174 Other bilateral secondary osteoarthritis of knee: Secondary | ICD-10-CM

## 2024-01-29 DIAGNOSIS — E1165 Type 2 diabetes mellitus with hyperglycemia: Secondary | ICD-10-CM

## 2024-01-29 DIAGNOSIS — J8410 Pulmonary fibrosis, unspecified: Secondary | ICD-10-CM

## 2024-01-29 DIAGNOSIS — R52 Pain, unspecified: Secondary | ICD-10-CM

## 2024-01-30 ENCOUNTER — Other Ambulatory Visit (HOSPITAL_COMMUNITY): Payer: Self-pay

## 2024-01-30 ENCOUNTER — Ambulatory Visit (INDEPENDENT_AMBULATORY_CARE_PROVIDER_SITE_OTHER): Admitting: Family Medicine

## 2024-01-30 ENCOUNTER — Encounter: Payer: Self-pay | Admitting: Family Medicine

## 2024-01-30 ENCOUNTER — Other Ambulatory Visit: Payer: Self-pay

## 2024-01-30 VITALS — BP 138/85 | HR 98 | Ht 63.0 in | Wt 314.2 lb

## 2024-01-30 DIAGNOSIS — E119 Type 2 diabetes mellitus without complications: Secondary | ICD-10-CM

## 2024-01-30 DIAGNOSIS — Z794 Long term (current) use of insulin: Secondary | ICD-10-CM

## 2024-01-30 DIAGNOSIS — G8929 Other chronic pain: Secondary | ICD-10-CM

## 2024-01-30 MED ORDER — OXYCODONE HCL 5 MG PO TABS
5.0000 mg | ORAL_TABLET | Freq: Four times a day (QID) | ORAL | 0 refills | Status: AC | PRN
  Filled 2024-01-30: qty 40, 5d supply, fill #0

## 2024-01-30 MED ORDER — OXYBUTYNIN CHLORIDE 5 MG PO TABS
5.0000 mg | ORAL_TABLET | Freq: Two times a day (BID) | ORAL | 1 refills | Status: AC
  Filled 2024-01-30: qty 60, 30d supply, fill #0

## 2024-01-30 MED ORDER — ALBUTEROL SULFATE HFA 108 (90 BASE) MCG/ACT IN AERS
2.0000 | INHALATION_SPRAY | RESPIRATORY_TRACT | 12 refills | Status: AC | PRN
  Filled 2024-01-30: qty 6.7, 17d supply, fill #0

## 2024-01-30 MED ORDER — MOUNJARO 15 MG/0.5ML ~~LOC~~ SOAJ
15.0000 mg | SUBCUTANEOUS | 0 refills | Status: AC
  Filled 2024-01-30: qty 6, 84d supply, fill #0

## 2024-01-30 MED ORDER — DICLOFENAC SODIUM 50 MG PO TBEC
50.0000 mg | DELAYED_RELEASE_TABLET | Freq: Two times a day (BID) | ORAL | 0 refills | Status: AC | PRN
  Filled 2024-01-30: qty 60, 30d supply, fill #0

## 2024-01-30 NOTE — Assessment & Plan Note (Signed)
 Stable on oxycodone , this does help with ADLs and mobility Continue f/u with orthopedics for epidural injections. Hopeful to come off of oxycodone  Discussed safe storage and practices. Reviewed pdmp F/u 3mo for pain mgmt, consider toxassure at that time

## 2024-01-30 NOTE — Patient Instructions (Signed)
 Continue all meds as prescribed  See your eye doctor and GI doctor

## 2024-01-30 NOTE — Assessment & Plan Note (Addendum)
 Well controlled on current regimen, continue Foot exam WNL UACR 12/4 with ratio of 4 BMP unremarkable Lipid panel slightly low HDL otherwise appropriate

## 2024-01-30 NOTE — Progress Notes (Signed)
" ° ° °  SUBJECTIVE:   CHIEF COMPLAINT / HPI:   T2DM -Current medication regimen: Basaglar  24u daily (reduced from 30 at last visit with Dr. Koval on 12/4), Mounjaro  15mg  weekly (has had weight loss on this), Jardiance  10mg  daily -Home CBGs: denies significant lows or highs over the past 2 weeks -Denies polyuria, polydipsia, abdominal pain, chest pain, shortness of breath -Foot exam: Today -Eye exam: apt in march     Lab Results  Component Value Date   HGBA1C 6.7 12/29/2023   HGBA1C 6.8 06/21/2023   HGBA1C 7.1 (A) 11/05/2022    She is on oxycodone  for pain control in the setting of her spinal stenosis She received epidural steroid injection with orthopedics on 09/05/23 She is storing the oxy in a safe place. It helps her be mobile and reduces pain. She denies sensation of withdrawals or cravings.  PERTINENT  PMH / PSH: reviewed  OBJECTIVE:   BP 138/85   Pulse 98   Ht 5' 3 (1.6 m)   Wt (!) 314 lb 3.2 oz (142.5 kg)   SpO2 100%   BMI 55.66 kg/m    General: NAD, pleasant, able to participate in exam Respiratory: No respiratory distress Skin: warm and dry, no rashes noted Psych: Normal affect and mood   Feet: No notable skin breakdown or wounds.  Sensation intact to sharp and dull sensation in multiple locations on the toes, dorsum of foot, sole, and above the ankle.  Proprioception intact.   ASSESSMENT/PLAN:   Assessment & Plan Type 2 diabetes mellitus without complication, with long-term current use of insulin  (HCC) Well controlled on current regimen, continue Foot exam WNL UACR 12/4 with ratio of 4 BMP unremarkable Lipid panel slightly low HDL otherwise appropriate Encounter for chronic pain management Stable on oxycodone , this does help with ADLs and mobility Continue f/u with orthopedics for epidural injections. Hopeful to come off of oxycodone  Discussed safe storage and practices. Reviewed pdmp F/u 62mo for pain mgmt, consider toxassure at that time  Pt to  schedule for pap smear Colonoscopy scheduled later this month  Payton Coward, MD [facility] "

## 2024-01-31 ENCOUNTER — Encounter: Payer: Self-pay | Admitting: Pharmacist

## 2024-01-31 ENCOUNTER — Other Ambulatory Visit: Payer: Self-pay

## 2024-01-31 ENCOUNTER — Other Ambulatory Visit (HOSPITAL_COMMUNITY): Payer: Self-pay

## 2024-01-31 MED ORDER — CYCLOSPORINE 0.05 % OP EMUL
1.0000 [drp] | Freq: Two times a day (BID) | OPHTHALMIC | 3 refills | Status: AC
  Filled 2024-01-31: qty 5.5, 25d supply, fill #0

## 2024-02-02 ENCOUNTER — Other Ambulatory Visit: Payer: Self-pay

## 2024-02-02 ENCOUNTER — Other Ambulatory Visit (HOSPITAL_COMMUNITY): Payer: Self-pay

## 2024-02-03 ENCOUNTER — Other Ambulatory Visit (HOSPITAL_COMMUNITY): Payer: Self-pay

## 2024-02-03 ENCOUNTER — Other Ambulatory Visit: Payer: Self-pay

## 2024-02-03 MED ORDER — TRAMADOL HCL 50 MG PO TABS
50.0000 mg | ORAL_TABLET | Freq: Three times a day (TID) | ORAL | 0 refills | Status: AC | PRN
  Filled 2024-02-03: qty 30, 10d supply, fill #0

## 2024-02-17 NOTE — Progress Notes (Signed)
 [name]                                          MRN: [id number]   02/17/2024   The VBCI Quality Team Specialist reviewed this patient medical record for the purposes of chart review for care gap closure. The following were reviewed: chart review for care gap closure-glycemic status assessment.    VBCI Quality Team

## 2024-02-23 ENCOUNTER — Ambulatory Visit: Admitting: Gastroenterology

## 2024-03-16 ENCOUNTER — Ambulatory Visit: Admitting: Gastroenterology
